# Patient Record
Sex: Male | Born: 1940
Health system: Southern US, Community
[De-identification: ages and names within clinical notes are randomized; demographics above are authoritative.]

## PROBLEM LIST (undated history)

## (undated) DIAGNOSIS — J449 Chronic obstructive pulmonary disease, unspecified: Secondary | ICD-10-CM

## (undated) DIAGNOSIS — R569 Unspecified convulsions: Secondary | ICD-10-CM

## (undated) DIAGNOSIS — K5730 Diverticulosis of large intestine without perforation or abscess without bleeding: Secondary | ICD-10-CM

## (undated) DIAGNOSIS — R911 Solitary pulmonary nodule: Secondary | ICD-10-CM

## (undated) DIAGNOSIS — Z860100 Personal history of colon polyps, unspecified: Secondary | ICD-10-CM

## (undated) DIAGNOSIS — I10 Essential (primary) hypertension: Secondary | ICD-10-CM

## (undated) DIAGNOSIS — E785 Hyperlipidemia, unspecified: Secondary | ICD-10-CM

## (undated) DIAGNOSIS — F1010 Alcohol abuse, uncomplicated: Secondary | ICD-10-CM

## (undated) DIAGNOSIS — R0602 Shortness of breath: Secondary | ICD-10-CM

## (undated) DIAGNOSIS — R972 Elevated prostate specific antigen [PSA]: Secondary | ICD-10-CM

## (undated) DIAGNOSIS — F17210 Nicotine dependence, cigarettes, uncomplicated: Secondary | ICD-10-CM

## (undated) DIAGNOSIS — K219 Gastro-esophageal reflux disease without esophagitis: Secondary | ICD-10-CM

## (undated) DIAGNOSIS — I639 Cerebral infarction, unspecified: Secondary | ICD-10-CM

## (undated) DIAGNOSIS — F5104 Psychophysiologic insomnia: Secondary | ICD-10-CM

## (undated) HISTORY — DX: Solitary pulmonary nodule: R91.1

## (undated) HISTORY — DX: Personal history of colon polyps, unspecified: Z86.0100

## (undated) HISTORY — DX: Gastro-esophageal reflux disease without esophagitis: K21.9

## (undated) HISTORY — DX: Elevated prostate specific antigen (PSA): R97.20

## (undated) HISTORY — PX: VASECTOMY: SHX75

## (undated) HISTORY — DX: Personal history of colonic polyps: Z86.010

## (undated) HISTORY — DX: Cerebral infarction, unspecified: I63.9

## (undated) HISTORY — DX: Psychophysiologic insomnia: F51.04

## (undated) HISTORY — DX: Diverticulosis of large intestine without perforation or abscess without bleeding: K57.30

## (undated) HISTORY — DX: Hyperlipidemia, unspecified: E78.5

## (undated) HISTORY — DX: Essential (primary) hypertension: I10

## (undated) HISTORY — PX: COLONOSCOPY: SHX174

## (undated) HISTORY — DX: Nicotine dependence, cigarettes, uncomplicated: F17.210

## (undated) HISTORY — DX: Chronic obstructive pulmonary disease, unspecified: J44.9

---
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
## 2001-02-02 ENCOUNTER — Encounter (INDEPENDENT_AMBULATORY_CARE_PROVIDER_SITE_OTHER): Payer: Self-pay | Admitting: Specialist

## 2001-02-02 ENCOUNTER — Other Ambulatory Visit: Admission: RE | Admit: 2001-02-02 | Discharge: 2001-02-02 | Payer: Self-pay | Admitting: Internal Medicine

## 2004-08-17 ENCOUNTER — Ambulatory Visit: Payer: Self-pay | Admitting: Pulmonary Disease

## 2004-08-18 ENCOUNTER — Ambulatory Visit: Payer: Self-pay | Admitting: Pulmonary Disease

## 2005-03-02 ENCOUNTER — Ambulatory Visit: Payer: Self-pay | Admitting: Pulmonary Disease

## 2005-03-24 ENCOUNTER — Ambulatory Visit: Payer: Self-pay | Admitting: Pulmonary Disease

## 2005-06-28 ENCOUNTER — Ambulatory Visit: Payer: Self-pay | Admitting: Pulmonary Disease

## 2005-07-21 ENCOUNTER — Ambulatory Visit: Payer: Self-pay | Admitting: Pulmonary Disease

## 2006-01-03 ENCOUNTER — Ambulatory Visit: Payer: Self-pay | Admitting: Pulmonary Disease

## 2006-01-04 ENCOUNTER — Ambulatory Visit: Payer: Self-pay | Admitting: Pulmonary Disease

## 2006-07-05 ENCOUNTER — Ambulatory Visit: Payer: Self-pay | Admitting: Internal Medicine

## 2006-07-18 ENCOUNTER — Encounter (INDEPENDENT_AMBULATORY_CARE_PROVIDER_SITE_OTHER): Payer: Self-pay | Admitting: *Deleted

## 2006-07-18 ENCOUNTER — Ambulatory Visit: Payer: Self-pay | Admitting: Internal Medicine

## 2006-07-26 ENCOUNTER — Ambulatory Visit: Payer: Self-pay | Admitting: Pulmonary Disease

## 2006-12-02 ENCOUNTER — Ambulatory Visit: Payer: Self-pay | Admitting: Pulmonary Disease

## 2007-03-29 ENCOUNTER — Ambulatory Visit: Payer: Self-pay | Admitting: Pulmonary Disease

## 2007-03-29 LAB — CONVERTED CEMR LAB
ALT: 36 units/L (ref 0–53)
AST: 47 units/L — ABNORMAL HIGH (ref 0–37)
Alkaline Phosphatase: 68 units/L (ref 39–117)
BUN: 4 mg/dL — ABNORMAL LOW (ref 6–23)
Basophils Relative: 0.3 % (ref 0.0–1.0)
CO2: 30 meq/L (ref 19–32)
Calcium: 9.6 mg/dL (ref 8.4–10.5)
Chloride: 106 meq/L (ref 96–112)
Direct LDL: 91.4 mg/dL
Eosinophils Absolute: 0 10*3/uL (ref 0.0–0.6)
GFR calc Af Amer: 96 mL/min
GFR calc non Af Amer: 80 mL/min
HDL: 72.4 mg/dL (ref >39.0)
Ketones, ur: NEGATIVE mg/dL
Lymphocytes Relative: 17.6 % (ref 12.0–46.0)
Monocytes Relative: 11.8 % — ABNORMAL HIGH (ref 3.0–11.0)
Neutro Abs: 5.6 10*3/uL (ref 1.4–7.7)
Nitrite: NEGATIVE
Platelets: 209 10*3/uL (ref 150–400)
RBC: 4.67 M/uL (ref 4.22–5.81)
Specific Gravity, Urine: 1.025 (ref 1.000–1.03)
TSH: 0.85 microintl units/mL (ref 0.35–5.50)
Total CHOL/HDL Ratio: 2.7
Triglycerides: 240 mg/dL (ref 0–149)
Urine Glucose: NEGATIVE mg/dL
VLDL: 48 mg/dL — ABNORMAL HIGH (ref 0–40)
WBC: 7.9 10*3/uL (ref 4.5–10.5)

## 2007-06-06 ENCOUNTER — Ambulatory Visit: Payer: Self-pay | Admitting: Pulmonary Disease

## 2007-08-18 ENCOUNTER — Telehealth (INDEPENDENT_AMBULATORY_CARE_PROVIDER_SITE_OTHER): Payer: Self-pay | Admitting: *Deleted

## 2007-10-10 ENCOUNTER — Ambulatory Visit: Payer: Self-pay | Admitting: Pulmonary Disease

## 2007-10-10 DIAGNOSIS — D126 Benign neoplasm of colon, unspecified: Secondary | ICD-10-CM

## 2007-10-10 DIAGNOSIS — E785 Hyperlipidemia, unspecified: Secondary | ICD-10-CM

## 2007-10-10 DIAGNOSIS — K219 Gastro-esophageal reflux disease without esophagitis: Secondary | ICD-10-CM | POA: Insufficient documentation

## 2007-10-10 DIAGNOSIS — E119 Type 2 diabetes mellitus without complications: Secondary | ICD-10-CM

## 2007-10-10 DIAGNOSIS — K5730 Diverticulosis of large intestine without perforation or abscess without bleeding: Secondary | ICD-10-CM | POA: Insufficient documentation

## 2007-10-10 DIAGNOSIS — I10 Essential (primary) hypertension: Secondary | ICD-10-CM

## 2007-10-15 DIAGNOSIS — G4700 Insomnia, unspecified: Secondary | ICD-10-CM | POA: Insufficient documentation

## 2007-10-15 LAB — CONVERTED CEMR LAB
AST: 32 units/L (ref 0–37)
Albumin: 4.3 g/dL (ref 3.5–5.2)
BUN: 9 mg/dL (ref 6–23)
Basophils Absolute: 0 10*3/uL (ref 0.0–0.1)
Calcium: 9.7 mg/dL (ref 8.4–10.5)
Chloride: 100 meq/L (ref 96–112)
Cholesterol: 162 mg/dL (ref 0–200)
Eosinophils Absolute: 0 10*3/uL (ref 0.0–0.6)
GFR calc non Af Amer: 79 mL/min
HDL: 54.2 mg/dL (ref >39.0)
Hgb A1c MFr Bld: 5.1 % (ref 4.6–6.0)
MCHC: 35 g/dL (ref 30.0–36.0)
MCV: 105 fL — ABNORMAL HIGH (ref 78.0–100.0)
Monocytes Relative: 6.7 % (ref 3.0–11.0)
Neutrophils Relative %: 81.4 % — ABNORMAL HIGH (ref 43.0–77.0)
Platelets: 261 10*3/uL (ref 150–400)
RBC: 4.75 M/uL (ref 4.22–5.81)
Sodium: 137 meq/L (ref 135–145)
TSH: 0.42 microintl units/mL (ref 0.35–5.50)
Triglycerides: 204 mg/dL (ref 0–149)

## 2007-10-20 ENCOUNTER — Telehealth (INDEPENDENT_AMBULATORY_CARE_PROVIDER_SITE_OTHER): Payer: Self-pay | Admitting: *Deleted

## 2007-11-10 ENCOUNTER — Ambulatory Visit: Payer: Self-pay | Admitting: Pulmonary Disease

## 2007-11-14 ENCOUNTER — Telehealth: Payer: Self-pay | Admitting: Pulmonary Disease

## 2007-11-22 ENCOUNTER — Telehealth (INDEPENDENT_AMBULATORY_CARE_PROVIDER_SITE_OTHER): Payer: Self-pay | Admitting: *Deleted

## 2008-02-06 ENCOUNTER — Ambulatory Visit: Payer: Self-pay | Admitting: Pulmonary Disease

## 2008-02-07 DIAGNOSIS — F17200 Nicotine dependence, unspecified, uncomplicated: Secondary | ICD-10-CM

## 2008-02-07 DIAGNOSIS — Z720 Tobacco use: Secondary | ICD-10-CM | POA: Insufficient documentation

## 2008-02-21 ENCOUNTER — Encounter: Payer: Self-pay | Admitting: Pulmonary Disease

## 2008-03-18 ENCOUNTER — Telehealth (INDEPENDENT_AMBULATORY_CARE_PROVIDER_SITE_OTHER): Payer: Self-pay | Admitting: *Deleted

## 2008-10-22 ENCOUNTER — Ambulatory Visit: Payer: Self-pay | Admitting: Pulmonary Disease

## 2008-10-23 ENCOUNTER — Ambulatory Visit: Payer: Self-pay | Admitting: Pulmonary Disease

## 2008-10-23 ENCOUNTER — Telehealth: Payer: Self-pay | Admitting: Pulmonary Disease

## 2008-10-28 LAB — CONVERTED CEMR LAB
AST: 23 units/L (ref 0–37)
Albumin: 3.6 g/dL (ref 3.5–5.2)
Alkaline Phosphatase: 89 units/L (ref 39–117)
BUN: 10 mg/dL (ref 6–23)
Eosinophils Relative: 0.8 % (ref 0.0–5.0)
GFR calc Af Amer: 108 mL/min
Glucose, Bld: 120 mg/dL — ABNORMAL HIGH (ref 70–99)
HCT: 43.8 % (ref 39.0–52.0)
HDL: 60.6 mg/dL (ref >39.0)
Hemoglobin: 15.4 g/dL (ref 13.0–17.0)
Monocytes Absolute: 0.8 10*3/uL (ref 0.1–1.0)
Monocytes Relative: 10.5 % (ref 3.0–12.0)
Platelets: 258 10*3/uL (ref 150–400)
Potassium: 4.1 meq/L (ref 3.5–5.1)
RBC: 4.24 M/uL (ref 4.22–5.81)
TSH: 0.92 microintl units/mL (ref 0.35–5.50)
Total CHOL/HDL Ratio: 2.5
Total Protein: 6.5 g/dL (ref 6.0–8.3)
WBC: 7.7 10*3/uL (ref 4.5–10.5)

## 2008-10-29 ENCOUNTER — Ambulatory Visit: Payer: Self-pay | Admitting: Cardiology

## 2008-11-01 ENCOUNTER — Telehealth: Payer: Self-pay | Admitting: Pulmonary Disease

## 2009-01-08 ENCOUNTER — Telehealth: Payer: Self-pay | Admitting: Pulmonary Disease

## 2009-01-25 HISTORY — PX: OTHER SURGICAL HISTORY: SHX169

## 2009-01-28 ENCOUNTER — Ambulatory Visit: Payer: Self-pay | Admitting: Pulmonary Disease

## 2009-01-28 DIAGNOSIS — J984 Other disorders of lung: Secondary | ICD-10-CM | POA: Insufficient documentation

## 2009-01-28 DIAGNOSIS — J449 Chronic obstructive pulmonary disease, unspecified: Secondary | ICD-10-CM

## 2009-02-04 ENCOUNTER — Ambulatory Visit (HOSPITAL_COMMUNITY): Admission: RE | Admit: 2009-02-04 | Discharge: 2009-02-04 | Payer: Self-pay | Admitting: Pulmonary Disease

## 2009-02-11 ENCOUNTER — Encounter: Payer: Self-pay | Admitting: Pulmonary Disease

## 2009-02-11 ENCOUNTER — Encounter (INDEPENDENT_AMBULATORY_CARE_PROVIDER_SITE_OTHER): Payer: Self-pay | Admitting: Interventional Radiology

## 2009-02-11 ENCOUNTER — Ambulatory Visit (HOSPITAL_COMMUNITY): Admission: RE | Admit: 2009-02-11 | Discharge: 2009-02-11 | Payer: Self-pay | Admitting: Pulmonary Disease

## 2009-02-14 ENCOUNTER — Telehealth (INDEPENDENT_AMBULATORY_CARE_PROVIDER_SITE_OTHER): Payer: Self-pay | Admitting: *Deleted

## 2009-05-23 ENCOUNTER — Encounter: Payer: Self-pay | Admitting: Pulmonary Disease

## 2009-05-26 ENCOUNTER — Telehealth: Payer: Self-pay | Admitting: Pulmonary Disease

## 2009-06-03 ENCOUNTER — Ambulatory Visit: Payer: Self-pay | Admitting: Pulmonary Disease

## 2009-06-03 ENCOUNTER — Telehealth: Payer: Self-pay | Admitting: Pulmonary Disease

## 2009-06-03 LAB — CONVERTED CEMR LAB
AST: 24 units/L (ref 0–37)
Alkaline Phosphatase: 81 units/L (ref 39–117)
Basophils Absolute: 0.1 10*3/uL (ref 0.0–0.1)
Bilirubin, Direct: 0.2 mg/dL (ref 0.0–0.3)
GFR calc non Af Amer: 89.17 mL/min (ref >60)
HCT: 44.8 % (ref 39.0–52.0)
LDL Cholesterol: 84 mg/dL (ref 0–99)
Lymphs Abs: 1.4 10*3/uL (ref 0.7–4.0)
MCV: 108.6 fL — ABNORMAL HIGH (ref 78.0–100.0)
Monocytes Absolute: 0.7 10*3/uL (ref 0.1–1.0)
Neutro Abs: 4.8 10*3/uL (ref 1.4–7.7)
Platelets: 263 10*3/uL (ref 150.0–400.0)
Potassium: 4.2 meq/L (ref 3.5–5.1)
RDW: 13 % (ref 11.5–14.6)
Sodium: 139 meq/L (ref 135–145)
TSH: 0.6 microintl units/mL (ref 0.35–5.50)
Total Bilirubin: 1.3 mg/dL — ABNORMAL HIGH (ref 0.3–1.2)
Total CHOL/HDL Ratio: 3
VLDL: 14 mg/dL (ref 0.0–40.0)

## 2009-08-29 ENCOUNTER — Encounter: Payer: Self-pay | Admitting: Pulmonary Disease

## 2009-09-02 ENCOUNTER — Observation Stay (HOSPITAL_COMMUNITY): Admission: RE | Admit: 2009-09-02 | Discharge: 2009-09-03 | Payer: Self-pay | Admitting: Ophthalmology

## 2009-09-02 ENCOUNTER — Telehealth (INDEPENDENT_AMBULATORY_CARE_PROVIDER_SITE_OTHER): Payer: Self-pay | Admitting: *Deleted

## 2009-10-02 ENCOUNTER — Telehealth: Payer: Self-pay | Admitting: Pulmonary Disease

## 2009-10-03 ENCOUNTER — Ambulatory Visit: Payer: Self-pay | Admitting: Pulmonary Disease

## 2009-10-05 LAB — CONVERTED CEMR LAB
ALT: 29 units/L (ref 0–53)
Basophils Relative: 0.7 % (ref 0.0–3.0)
Bilirubin, Direct: 0.3 mg/dL (ref 0.0–0.3)
Chloride: 109 meq/L (ref 96–112)
Creatinine, Ser: 0.9 mg/dL (ref 0.4–1.5)
Eosinophils Absolute: 0 10*3/uL (ref 0.0–0.7)
Eosinophils Relative: 0.4 % (ref 0.0–5.0)
GFR calc non Af Amer: 89.08 mL/min (ref >60)
HCT: 48.4 % (ref 39.0–52.0)
Hemoglobin: 16 g/dL (ref 13.0–17.0)
LDL Cholesterol: 90 mg/dL (ref 0–99)
Lymphs Abs: 1.5 10*3/uL (ref 0.7–4.0)
MCHC: 33 g/dL (ref 30.0–36.0)
MCV: 108.6 fL — ABNORMAL HIGH (ref 78.0–100.0)
Monocytes Absolute: 0.8 10*3/uL (ref 0.1–1.0)
Neutro Abs: 5 10*3/uL (ref 1.4–7.7)
Neutrophils Relative %: 69 % (ref 43.0–77.0)
RBC: 4.45 M/uL (ref 4.22–5.81)
Total Bilirubin: 1.2 mg/dL (ref 0.3–1.2)
Total CHOL/HDL Ratio: 3
Triglycerides: 60 mg/dL (ref 0.0–149.0)
VLDL: 12 mg/dL (ref 0.0–40.0)
WBC: 7.4 10*3/uL (ref 4.5–10.5)

## 2009-12-10 ENCOUNTER — Telehealth: Payer: Self-pay | Admitting: Pulmonary Disease

## 2010-01-27 ENCOUNTER — Telehealth (INDEPENDENT_AMBULATORY_CARE_PROVIDER_SITE_OTHER): Payer: Self-pay | Admitting: *Deleted

## 2010-02-27 ENCOUNTER — Telehealth: Payer: Self-pay | Admitting: Pulmonary Disease

## 2010-03-31 ENCOUNTER — Telehealth (INDEPENDENT_AMBULATORY_CARE_PROVIDER_SITE_OTHER): Payer: Self-pay | Admitting: *Deleted

## 2010-04-01 ENCOUNTER — Ambulatory Visit: Payer: Self-pay | Admitting: Pulmonary Disease

## 2010-04-16 ENCOUNTER — Telehealth (INDEPENDENT_AMBULATORY_CARE_PROVIDER_SITE_OTHER): Payer: Self-pay | Admitting: *Deleted

## 2010-09-10 ENCOUNTER — Telehealth: Payer: Self-pay | Admitting: Pulmonary Disease

## 2010-09-11 ENCOUNTER — Telehealth (INDEPENDENT_AMBULATORY_CARE_PROVIDER_SITE_OTHER): Payer: Self-pay | Admitting: *Deleted

## 2010-09-30 ENCOUNTER — Ambulatory Visit
Admission: RE | Admit: 2010-09-30 | Discharge: 2010-09-30 | Payer: Self-pay | Source: Home / Self Care | Attending: Pulmonary Disease | Admitting: Pulmonary Disease

## 2010-09-30 ENCOUNTER — Other Ambulatory Visit: Payer: Self-pay | Admitting: Pulmonary Disease

## 2010-09-30 DIAGNOSIS — R972 Elevated prostate specific antigen [PSA]: Secondary | ICD-10-CM | POA: Insufficient documentation

## 2010-09-30 LAB — BASIC METABOLIC PANEL
BUN: 4 mg/dL — ABNORMAL LOW (ref 6–23)
CO2: 26 mEq/L (ref 19–32)
Calcium: 9.4 mg/dL (ref 8.4–10.5)
Chloride: 106 mEq/L (ref 96–112)
Creatinine, Ser: 0.6 mg/dL (ref 0.4–1.5)
GFR: 134.06 mL/min (ref >60.00)
Glucose, Bld: 82 mg/dL (ref 70–99)
Potassium: 5 mEq/L (ref 3.5–5.1)
Sodium: 141 mEq/L (ref 135–145)

## 2010-09-30 LAB — CBC WITH DIFFERENTIAL/PLATELET
Basophils Absolute: 0 10*3/uL (ref 0.0–0.1)
Basophils Relative: 0.6 % (ref 0.0–3.0)
Eosinophils Absolute: 0 10*3/uL (ref 0.0–0.7)
Eosinophils Relative: 0.7 % (ref 0.0–5.0)
HCT: 47.7 % (ref 39.0–52.0)
Hemoglobin: 16.5 g/dL (ref 13.0–17.0)
Lymphocytes Relative: 21 % (ref 12.0–46.0)
Lymphs Abs: 1.2 10*3/uL (ref 0.7–4.0)
MCHC: 34.7 g/dL (ref 30.0–36.0)
MCV: 108.5 fl — ABNORMAL HIGH (ref 78.0–100.0)
Monocytes Absolute: 0.5 10*3/uL (ref 0.1–1.0)
Monocytes Relative: 7.9 % (ref 3.0–12.0)
Neutro Abs: 4.1 10*3/uL (ref 1.4–7.7)
Neutrophils Relative %: 69.8 % (ref 43.0–77.0)
Platelets: 156 10*3/uL (ref 150.0–400.0)
RBC: 4.4 Mil/uL (ref 4.22–5.81)
RDW: 14.2 % (ref 11.5–14.6)
WBC: 5.8 10*3/uL (ref 4.5–10.5)

## 2010-09-30 LAB — HEPATIC FUNCTION PANEL
ALT: 30 U/L (ref 0–53)
AST: 48 U/L — ABNORMAL HIGH (ref 0–37)
Albumin: 3.6 g/dL (ref 3.5–5.2)
Alkaline Phosphatase: 76 U/L (ref 39–117)
Bilirubin, Direct: 0.2 mg/dL (ref 0.0–0.3)
Total Bilirubin: 0.8 mg/dL (ref 0.3–1.2)
Total Protein: 6.3 g/dL (ref 6.0–8.3)

## 2010-09-30 LAB — LIPID PANEL
Cholesterol: 208 mg/dL — ABNORMAL HIGH (ref 0–200)
HDL: 106 mg/dL (ref >39.00)
Total CHOL/HDL Ratio: 2
Triglycerides: 70 mg/dL (ref 0.0–149.0)
VLDL: 14 mg/dL (ref 0.0–40.0)

## 2010-09-30 LAB — PSA: PSA: 7.39 ng/mL — ABNORMAL HIGH (ref 0.10–4.00)

## 2010-09-30 LAB — TSH: TSH: 1.05 u[IU]/mL (ref 0.35–5.50)

## 2010-09-30 LAB — LDL CHOLESTEROL, DIRECT: Direct LDL: 79.9 mg/dL

## 2010-10-19 ENCOUNTER — Telehealth (INDEPENDENT_AMBULATORY_CARE_PROVIDER_SITE_OTHER): Payer: Self-pay | Admitting: *Deleted

## 2010-10-20 ENCOUNTER — Ambulatory Visit
Admission: RE | Admit: 2010-10-20 | Discharge: 2010-10-20 | Payer: Self-pay | Source: Home / Self Care | Attending: Pulmonary Disease | Admitting: Pulmonary Disease

## 2010-10-27 NOTE — Progress Notes (Signed)
Summary: fax request  Phone Note From Other Clinic   Caller: kate at [facility] Call For: nadel Summary of Call: requests last OV notes/ EKG/ STRESS TEST. fax to attn kate: [phone]. contact # is [phone] Initial call taken by: Tivis Ringer, CNA,  April 16, 2010 8:58 AM  Follow-up for Phone Call        Faxed records.//Juanita Follow-up by: Darletta Moll,  April 16, 2010 9:21 AM
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
## 2010-10-27 NOTE — Progress Notes (Signed)
Summary: PRESCRIPT  Phone Note Call from Patient   Caller: Patient Call For: NADEL Summary of Call: PT NEED TEMAZEPAM REFILL [facility] [address] Initial call taken by: Rickard Patience,  December 10, 2009 10:55 AM  Follow-up for Phone Call        pt advised refill request too soon. Advised cant have refill until 12/15/09 becasue last refill was given on 11/17/09. Pt staets understanding. Carron Curie CMA  December 10, 2009 12:06 PM
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
## 2010-10-27 NOTE — Progress Notes (Signed)
Summary: labs?  Phone Note Call from Patient Call back at [facility] Phone [phone]   Caller: Patient Call For: Darril Patriarca Reason for Call: Talk to Nurse Summary of Call: is SN going to need fasting bloodwork for pt's appt tomorrow? Initial call taken by: Eugene Gavia,  October 02, 2009 8:19 AM  Follow-up for Phone Call        Last ov note states cxr at follow-up, but nothing about labs. Please advise. Carron Curie CMA  October 02, 2009 8:35 AM    called and spoke with pt and he is aware of labs and cxr in the am prior to his appt.   Randell Loop CMA  October 02, 2009 10:36 AM

## 2010-10-27 NOTE — Progress Notes (Signed)
Summary: rx  Phone Note Call from Patient Call back at Home Phone [phone]   Caller: Patient Call For: Blake [name] Reason for Call: Refill Medication, Talk to Nurse Summary of Call: Pt needs refill on his Temazopam  [facility] [address] Initial call taken by: Eugene Gavia,  February 27, 2010 9:10 AM  Follow-up for Phone Call        refill sent. pt aware.  Carron Curie CMA  February 27, 2010 9:47 AM     Prescriptions: TEMAZEPAM 30 MG  CAPS (TEMAZEPAM) 1 by mouth at bedtime as needed sleep  #30 x 0   Entered by:   Carron Curie CMA   Authorized by:   Michele Mcalpine MD   Signed by:   Carron Curie CMA on 02/27/2010   Method used:   Telephoned to ...       [facility]  [address] [id number]       [address]       [place], [place]  [address]       Ph: [phone] or [phone]       Fax: [phone]   RxID:   [id number]

## 2010-10-27 NOTE — Progress Notes (Signed)
Summary: rx refill (pt waiting)  Phone Note Call from Patient   Caller: Patient Call For: nadel Summary of Call: pt is upset that his rx for temazepam has not been filled yet. [facility] [address] [facility] they have contacted dr Jodelle Green office twice w/ no response. i advised pt to call them and let them know that no request for refill has been faxed to [place] and that i would send this msg to nurse in the meantime. pt # [id number] NOTE: pt says he has eye surgery pending soon and needs to get this asap. Initial call taken by: Tivis Ringer, CNA,  Jan 27, 2010 12:27 PM  Follow-up for Phone Call        called and spoke with pt. pt aware rx sent to pharmacy.  Aundra Millet Reynolds LPN  Jan 27, 6577 1:06 PM     Prescriptions: TEMAZEPAM 30 MG  CAPS (TEMAZEPAM) 1 by mouth at bedtime as needed sleep  #30 x 0   Entered by:   Arman Filter LPN   Authorized by:   Michele Mcalpine MD   Signed by:   Arman Filter LPN on 46/96/2952   Method used:   Telephoned to ...       [facility]  [address] [id number]       [address]       [place], [place]  [address]       Ph: [phone] or [phone]       Fax: [phone]   RxID:   [id number]

## 2010-10-27 NOTE — Progress Notes (Signed)
Summary: talk to nurse  Phone Note Call from Patient Call back at Home Phone [phone]   Caller: Patient Call For: nadel Summary of Call: Has appt with SN tomorrow, wants to know if he has to have any labs done also. Initial call taken by: Darletta Moll,  March 31, 2010 1:30 PM  Follow-up for Phone Call        called spoke with patient.  will let SN decide about labs and pt may return to have them drawn if needed per leigh.  pt verbalized his understanding. Boone Master CNA/MA  March 31, 2010 2:50 PM

## 2010-10-29 NOTE — Progress Notes (Signed)
Summary: appointment tomorrow for forms wants to have f/u for prostate 2  Phone Note Call from Patient   Caller: Patient Call For: dr nadel Summary of Call: Patient phoned stated that he has an appointment to have forms completed tomorrow, but he was here earlier this month for his prostate and was to follow up when he finished his meds to recheck this. He wants to know if this is just lab work and can he do that tomorrow while he is here. Patient can be reached at [phone] Initial call taken by: Vedia Coffer,  October 19, 2010 11:14 AM  Follow-up for Phone Call        pt was started on septra ds 1.6.12 for elevated psa and told to return on 2.8.12 for repeat psa.  pt has appt 1.24.12 w/ SN.    called spoke with patient to inform him of this, pt states he still has 6 more tabs left to take.  informed pt that 2 weeks ealry, especially if he hasn't finished the meds is too early.  pt okay with this and verbalized his understanding.  will keep his appt tomorrow w/ SN and states he has more paperwork from the [facility] that he will bring at this time. Boone Master CNA/MA  October 19, 2010 2:40 PM

## 2010-10-29 NOTE — Assessment & Plan Note (Signed)
Summary: dmv forms/la   CC:  ROV to complete DMV forms....  History of Present Illness: [name] here for a follow up visit... he has mult med problems as noted below... Followed for general medical purposes w/ hx of COPD, still smoking 1ppd, HBP, Hyperlipidemia, DM, & chr persistant insomnia...  He had called several times asking [place] to fax his records to the [place]-DMV which we did at his request, but of course they wanted their [facility] Medical forms completed & we asked him to return w/ these papaers for completion >>> -DMV forms completed and scanned into the EMR... I indicated to him that he will need to have the Vision page completed by his Ophthalmologist esp in light of his right eye trauma & blindness...  Current Meds:  ATENOLOL 50 MG TABS (ATENOLOL) take 1 tab by mouth once daily... RAMIPRIL 5 MG CAPS (RAMIPRIL) take 1 cap by mouth once daily... GEMFIBROZIL 600 MG TABS (GEMFIBROZIL) take one tablet by mouth two times a day... CIALIS 20 MG  TABS (TADALAFIL) use as directed TEMAZEPAM 30 MG  CAPS (TEMAZEPAM) 1 by mouth at bedtime as needed sleep FOLIC ACID 1 MG TABS (FOLIC ACID) Take 1 tablet by mouth once a day    Preventive Screening-Counseling & Management  Alcohol-Tobacco     Smoking Status: current     Packs/Day: 1   Allergies (verified): No Known Drug Allergies  Comments:  Nurse/Medical Assistant: The patient's medications and allergies were reviewed with the patient and were updated in the Medication and Allergy Lists.  Past History:  Past Medical History: PULMONARY NODULE (ICD-518.89) COPD (ICD-496) CIGARETTE SMOKER (ICD-305.1) HYPERTENSION (ICD-401.9) HYPERLIPIDEMIA (ICD-272.4) DM (ICD-250.00) GERD (ICD-530.81) DIVERTICULOSIS OF COLON (ICD-562.10) COLONIC POLYPS (ICD-211.3) ELEVATED PROSTATE SPECIFIC ANTIGEN (ICD-790.93) INSOMNIA, CHRONIC (ICD-307.42)  Past Surgical History: S/P vasectomy S/P needle bx RUL nodule 5/10= benign  Family History: Reviewed  history from 04/01/2010 and no changes required. Father died age 55 w/ DM... Mother died age 67 w/ the Flu, hx THR... 1 Sibling- Brother died age 87 from lung cancer  Social History: Reviewed history from 04/01/2010 and no changes required. Widow, wife=  Mary died in the 2000s 1 Child- son died in MVA 1ppd smoker x yrs hx signif alcohol- now 6pk beer daily retired from [employer], IT trainer  Review of Systems       The patient complains of vision loss and dyspnea on exertion.  The patient denies anorexia, fever, weight loss, weight gain, decreased hearing, hoarseness, chest pain, syncope, peripheral edema, prolonged cough, headaches, hemoptysis, abdominal pain, melena, hematochezia, severe indigestion/heartburn, hematuria, incontinence, muscle weakness, suspicious skin lesions, transient blindness, difficulty walking, depression, unusual weight change, abnormal bleeding, enlarged lymph nodes, and angioedema.    Vital Signs:  Patient profile:   70 year old male Height:      70.5 inches Weight:      168.56 pounds O2 Sat:      97 % on Room air Temp:     98.6 degrees F oral Pulse rate:   68 / minute BP sitting:   126 / 70  (left arm) Cuff size:   regular  Vitals Entered By: Randell Loop CMA (October 20, 2010 3:09 PM)  O2 Sat at Rest %:  97 O2 Flow:  Room air CC: ROV to complete DMV forms... Is Patient Diabetic? No Pain Assessment Patient in pain? no      Comments no changes in meds today   Physical Exam  Additional Exam:  WD, WN, [name]  in NAD...  GENERAL:  Alert & oriented; pleasant & cooperative... HEENT:  EOM-wnl, Right eye sl dilated- blood in vitreous improved, EACs-clear, TMs-wnl, NOSE-clear, THROAT-clear & wnl. NECK:  Supple w/ fairROM; no JVD; normal carotid impulses w/o bruits; no thyromegaly or nodules palpated; no lymphadenopathy. CHEST:  Clear to P & A x scat rhonchi, no wheezing, no rales, no signs of consolidation... HEART:  Regular Rhythm; without murmurs/  rubs/ or gallops heard... ABDOMEN:  Obese, soft & nontender; normal bowel sounds; no organomegaly or masses detected. EXT: without deformities, mild arthritic changes; no varicose veins/ venous insuffic/ or edema. NEURO:  CN's intact;  no focal neuro deficits x decr vision right eye... DERM:  No lesions noted; no rash etc...    Impression & Recommendations:  Problem # 1:  [place]-DMV form completed today>>> We scanned these forms into computer, and mailed orig to [place]...  Problem # 2:  COPD (ICD-496) Stable>  needs to quit all smoking (still smokes 1ppd & not motivated to quit)...  Problem # 3:  HYPERTENSION (ICD-401.9) Controlled on meds>  continue same. His updated medication list for this problem includes:    Atenolol 50 Mg Tabs (Atenolol) .Marland Kitchen... Take 1 tab by mouth once daily...    Ramipril 5 Mg Caps (Ramipril) .Marland Kitchen... Take 1 cap by mouth once daily...  Problem # 4:  HYPERLIPIDEMIA (ICD-272.4) Stable on Fibrate + diet... His updated medication list for this problem includes:    Gemfibrozil 600 Mg Tabs (Gemfibrozil) .Marland Kitchen... Take one tablet by mouth two times a day...  Problem # 5:  DM (ICD-250.00) On diet alone & doing satis... His updated medication list for this problem includes:    Ramipril 5 Mg Caps (Ramipril) .Marland Kitchen... Take 1 cap by mouth once daily...  Problem # 6:  INSOMNIA, CHRONIC (ICD-307.42) He uses Temazepam as needed...  Problem # 7:  OTHER MEDICAL PROBLEMS AS NOTED>>>  Complete Medication List: 1)  Atenolol 50 Mg Tabs (Atenolol) .... Take 1 tab by mouth once daily.Marland KitchenMarland Kitchen 2)  Ramipril 5 Mg Caps (Ramipril) .... Take 1 cap by mouth once daily.Marland KitchenMarland Kitchen 3)  Gemfibrozil 600 Mg Tabs (Gemfibrozil) .... Take one tablet by mouth two times a day... 4)  Cialis 20 Mg Tabs (Tadalafil) .... Use as directed 5)  Temazepam 30 Mg Caps (Temazepam) .Marland Kitchen.. 1 by mouth at bedtime as needed sleep 6)  Folic Acid 1 Mg Tabs (Folic acid) .... Take 1 tablet by mouth once a day 7)  Septra Ds 800-160 Mg Tabs  (Sulfamethoxazole-trimethoprim) .... Take 1 tab by mouth two times a day til gone...  Patient Instructions: 1)  We will mail in your DMV form... 2)  Be sure to have your eye doctor complete the vision part of the form... 3)  Call for any questions.Marland KitchenMarland Kitchen 4)  Keep your prev sched follow up ROV.Marland KitchenMarland Kitchen

## 2010-10-29 NOTE — Progress Notes (Signed)
Summary: labs/ cxr  Phone Note Call from Patient Call back at Home Phone [phone]   Caller: Patient Call For: nadel Summary of Call: pt want sto know if he needs to come in early for labs and cxr. pt has appt w/ dr Kriste Basque 09/30/10. 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  Initial call taken by: Tivis Ringer, CNA,  September 10, 2010 11:06 AM  Follow-up for Phone Call        Pt is requestign labs be rder for his CPX on 09-30-10. 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 Please advsie. Carron Curie CMA  September 10, 2010 11:43 AM   Additional Follow-up for Phone Call Additional follow up Details #1::        per SN---ok for pt to come in for fasting labs and cxr prior to ov---these are in the computer for pt---pt insisted that these be done on the same day as appt-  pt will come in at 1 for these and his appt is at 2pm.   Randell Loop CMA  September 10, 2010 2:01 PM

## 2010-10-29 NOTE — Progress Notes (Signed)
Summary: re-fax request- needs asap!  Phone Note Call from Patient Call back at [facility] Phone [phone]   Caller: Patient Call For: nadel Summary of Call: pt requests to have OV notes from 04/01/10 faxed to [facility] asap. this was previously done but pt says DMV told him they have never received this. fax to [phone]. 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 call pt to confirm this has been done.  Initial call taken by: Tivis Ringer, CNA,  September 11, 2010 12:12 PM  Follow-up for Phone Call        Patient called medical records needing his 04/01/2010 office note faxed to the [facility]. 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 I advised the patient that he would need to sign a Medical release and then that note would be sent. He asked me to mail it to his home address. Release went out today.  Follow-up by: Wilder Glade,  September 11, 2010 12:30 PM
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
## 2010-11-04 ENCOUNTER — Other Ambulatory Visit: Payer: Self-pay

## 2010-11-13 ENCOUNTER — Other Ambulatory Visit: Payer: Medicare Other

## 2010-11-13 ENCOUNTER — Other Ambulatory Visit: Payer: Self-pay | Admitting: Pulmonary Disease

## 2010-11-13 ENCOUNTER — Encounter (INDEPENDENT_AMBULATORY_CARE_PROVIDER_SITE_OTHER): Payer: Self-pay | Admitting: *Deleted

## 2010-11-13 DIAGNOSIS — Z125 Encounter for screening for malignant neoplasm of prostate: Secondary | ICD-10-CM

## 2010-12-29 LAB — BASIC METABOLIC PANEL
BUN: 10 mg/dL (ref 6–23)
Chloride: 108 mEq/L (ref 96–112)
Glucose, Bld: 90 mg/dL (ref 70–99)
Potassium: 4.6 mEq/L (ref 3.5–5.1)

## 2010-12-29 LAB — CBC
HCT: 47.1 % (ref 39.0–52.0)
MCV: 107.2 fL — ABNORMAL HIGH (ref 78.0–100.0)
Platelets: 155 10*3/uL (ref 150–400)
WBC: 6.2 10*3/uL (ref 4.0–10.5)

## 2011-01-05 LAB — CBC
HCT: 45.9 % (ref 39.0–52.0)
MCHC: 35.5 g/dL (ref 30.0–36.0)
Platelets: 186 10*3/uL (ref 150–400)
RDW: 13.8 % (ref 11.5–15.5)

## 2011-01-05 LAB — APTT: aPTT: 29 seconds (ref 24–37)

## 2011-01-27 ENCOUNTER — Telehealth: Payer: Self-pay | Admitting: Pulmonary Disease

## 2011-01-27 NOTE — Telephone Encounter (Signed)
[id number] x1 

## 2011-01-27 NOTE — Telephone Encounter (Signed)
Pt phoned stated he was returning a call he can be reached at [phone].Vedia Coffer

## 2011-01-27 NOTE — Telephone Encounter (Signed)
Spoke w/ pt and he is coming in 5/8 at 2:30 to fill out paper work he dropped off. Blake [name] has these forms

## 2011-02-02 ENCOUNTER — Encounter: Payer: Self-pay | Admitting: Pulmonary Disease

## 2011-02-02 ENCOUNTER — Other Ambulatory Visit (INDEPENDENT_AMBULATORY_CARE_PROVIDER_SITE_OTHER): Payer: Medicare Other | Admitting: Pulmonary Disease

## 2011-02-02 ENCOUNTER — Other Ambulatory Visit: Payer: Medicare Other

## 2011-02-02 ENCOUNTER — Ambulatory Visit (INDEPENDENT_AMBULATORY_CARE_PROVIDER_SITE_OTHER): Payer: Medicare Other | Admitting: Pulmonary Disease

## 2011-02-02 DIAGNOSIS — Z024 Encounter for examination for driving license: Secondary | ICD-10-CM

## 2011-02-02 DIAGNOSIS — I10 Essential (primary) hypertension: Secondary | ICD-10-CM

## 2011-02-02 DIAGNOSIS — Z0289 Encounter for other administrative examinations: Secondary | ICD-10-CM

## 2011-02-02 DIAGNOSIS — K219 Gastro-esophageal reflux disease without esophagitis: Secondary | ICD-10-CM

## 2011-02-02 DIAGNOSIS — J449 Chronic obstructive pulmonary disease, unspecified: Secondary | ICD-10-CM

## 2011-02-02 DIAGNOSIS — N390 Urinary tract infection, site not specified: Secondary | ICD-10-CM

## 2011-02-02 DIAGNOSIS — G4700 Insomnia, unspecified: Secondary | ICD-10-CM

## 2011-02-02 DIAGNOSIS — E785 Hyperlipidemia, unspecified: Secondary | ICD-10-CM

## 2011-02-02 LAB — URINALYSIS, ROUTINE W REFLEX MICROSCOPIC
Ketones, ur: NEGATIVE
Specific Gravity, Urine: 1.015 (ref 1.000–1.030)
Total Protein, Urine: NEGATIVE
Urine Glucose: NEGATIVE
Urobilinogen, UA: 0.2 (ref 0.0–1.0)
pH: 7 (ref 5.0–8.0)
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
## 2011-02-02 NOTE — Patient Instructions (Signed)
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    We will mail it to [facility]...  Call for any problems, or if we can be of service in any way.Marland KitchenMarland Kitchen

## 2011-02-10 ENCOUNTER — Other Ambulatory Visit: Payer: Self-pay | Admitting: *Deleted

## 2011-02-10 NOTE — Telephone Encounter (Signed)
Refill for temazepam has been faxed back to [facility].  With no refills per SN

## 2011-02-10 NOTE — Telephone Encounter (Signed)
Pls advise if okay to send refills for Temazepam, 90 day supply, to [facility]. Form for signature given to Leigh.

## 2011-02-22 ENCOUNTER — Encounter: Payer: Self-pay | Admitting: Pulmonary Disease

## 2011-03-03 ENCOUNTER — Telehealth: Payer: Self-pay | Admitting: Pulmonary Disease

## 2011-03-03 DIAGNOSIS — E119 Type 2 diabetes mellitus without complications: Secondary | ICD-10-CM

## 2011-03-03 NOTE — Telephone Encounter (Signed)
Patient called back stated he was waiting on a call back for appointment his telephone is messing up he can be reached at [phone].Blake [name]

## 2011-03-03 NOTE — Telephone Encounter (Signed)
Spoke with pt.  He states that DMV is requiring that he have HBA1C drawn in order to renew his license. Pls advise if okay to order this thanks

## 2011-03-03 NOTE — Telephone Encounter (Signed)
Called and spoke with pt and he is aware of labs in the computer for tomorrow and pt is aware that he can come in anytime for these

## 2011-03-04 ENCOUNTER — Other Ambulatory Visit (INDEPENDENT_AMBULATORY_CARE_PROVIDER_SITE_OTHER): Payer: Medicare Other

## 2011-03-04 DIAGNOSIS — E119 Type 2 diabetes mellitus without complications: Secondary | ICD-10-CM

## 2011-03-04 LAB — BASIC METABOLIC PANEL WITH GFR
BUN: 5 mg/dL — ABNORMAL LOW (ref 6–23)
CO2: 26 meq/L (ref 19–32)
Calcium: 8.9 mg/dL (ref 8.4–10.5)
Chloride: 99 meq/L (ref 96–112)
Creatinine, Ser: 0.6 mg/dL (ref 0.4–1.5)
GFR: 138.97 mL/min
Glucose, Bld: 78 mg/dL (ref 70–99)
Potassium: 3.7 meq/L (ref 3.5–5.1)
Sodium: 137 meq/L (ref 135–145)

## 2011-03-04 LAB — HEMOGLOBIN A1C: Hgb A1c MFr Bld: 4.7 % (ref 4.6–6.5)

## 2011-03-08 ENCOUNTER — Telehealth: Payer: Self-pay | Admitting: Pulmonary Disease

## 2011-03-08 NOTE — Telephone Encounter (Signed)
Spoke with pt and advised per result note, copy already was sent to the [facility]. I have also mailed him the results per his request.

## 2011-03-09 ENCOUNTER — Telehealth: Payer: Self-pay | Admitting: Pulmonary Disease

## 2011-03-09 NOTE — Telephone Encounter (Signed)
Error pt received letter today from [facility] he also received letter from our office with all of his results. He thinks maybe everything just crossed in the mail. I advised him to check with DMV to confirm they have received the information.Roswell Nickel

## 2011-03-22 ENCOUNTER — Telehealth: Payer: Self-pay | Admitting: *Deleted

## 2011-03-22 NOTE — Telephone Encounter (Signed)
Pt states he called the dmv about additional papers and was told that the papers he needed had been filled out already, wants to speak to nurse in reference to this.Blake [name]

## 2011-03-22 NOTE — Telephone Encounter (Signed)
Pt came into the office this morning and spoke with lou ellen, who brought the [facility] paper to me and stating that the pt needed the CDL forms filled out.  Will call pt to make him aware that we do not have these forms that need to be filled out and that he will need an ov for this.

## 2011-03-22 NOTE — Telephone Encounter (Signed)
Called and spoke with pt and he is aware that other forms will need to be filled out for the [facility] CDL.  Pt is aware and will call the DMV to see about getting these forms.

## 2011-03-24 NOTE — Telephone Encounter (Signed)
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  Called and explained this to the pt and he is not sure what this is but he stated that he will try to figure it out and fax this back to the [facility].

## 2011-03-24 NOTE — Telephone Encounter (Signed)
Called, spoke with pt.  States he was told by [facility] no other forms need to be completed.  The only thing that needs to be done is the request he brought by.  He would like to know if this has been done yet.  Pls advise.  Thanks!

## 2011-04-13 ENCOUNTER — Telehealth: Payer: Self-pay | Admitting: Pulmonary Disease

## 2011-04-13 NOTE — Telephone Encounter (Signed)
I do not see where he needs labs- just had labs 6/7- Leigh pls advise thanks. His appt is at 2 pm tomorrow.

## 2011-04-13 NOTE — Telephone Encounter (Signed)
Called and spoke with pt.  Informed him no bloodwork needed tomorrow.  Pt verbalized understanding and denied any further questions.

## 2011-04-13 NOTE — Telephone Encounter (Signed)
No further labs will be needed at this appt. thanks

## 2011-04-14 ENCOUNTER — Ambulatory Visit: Payer: Self-pay | Admitting: Pulmonary Disease

## 2011-05-04 ENCOUNTER — Ambulatory Visit: Payer: Self-pay | Admitting: Pulmonary Disease

## 2011-05-06 ENCOUNTER — Telehealth: Payer: Self-pay | Admitting: *Deleted

## 2011-05-10 ENCOUNTER — Telehealth: Payer: Self-pay | Admitting: Pulmonary Disease

## 2011-05-10 MED ORDER — GEMFIBROZIL 600 MG PO TABS: 600.0000 mg | ORAL_TABLET | Freq: Two times a day (BID) | ORAL | Status: DC

## 2011-05-10 MED ORDER — ATENOLOL 50 MG PO TABS: 50.0000 mg | ORAL_TABLET | Freq: Every day | ORAL | Status: DC

## 2011-05-10 MED ORDER — FOLIC ACID 1 MG PO TABS: 1.0000 mg | ORAL_TABLET | Freq: Every day | ORAL | Status: DC

## 2011-05-10 MED ORDER — TEMAZEPAM 30 MG PO CAPS: 30.0000 mg | ORAL_CAPSULE | Freq: Every evening | ORAL | Status: DC | PRN

## 2011-05-10 MED ORDER — RAMIPRIL 5 MG PO CAPS: 5.0000 mg | ORAL_CAPSULE | Freq: Every day | ORAL | Status: DC

## 2011-05-10 NOTE — Telephone Encounter (Signed)
Pt needs all of his medications, except Cialis, sent to [facility] for 90 day supply. RX for his medications have been sent. Pt aware we will take care of this for him.  Prescriptions printed, signed by SN and faxed to [facility] at [phone].

## 2011-05-10 NOTE — Telephone Encounter (Signed)
PATIENT SAYS THAT [facility] TOLD HIM THAT WE FAILED TO PROVIDE INFORMATION FOR ONE OF HIS PRESCRIPTIONS.  HE DOES NOT KNOW WHICH MEDICATION AND SAYS IT IS OUR RESPONSIBILITY TO CALL PHARMACY TO FIND OUT.  PATIENT STATES THAT HE WILL NOT COME TO APPOINTMENT ON 05/15/11 AND WILL FIND ANOTHER PROVIDER IF WE DO NOT MAKE SURE THAT HIS MEDICATION IS REFILLED.  HE WANTS TO BE CALLED WHEN PHARMACY IS INFORMED AND HAS REFILLED MEDICATION

## 2011-05-11 NOTE — Telephone Encounter (Signed)
Refill of the temazepam has been faxed back to [facility] for #90 with 3 refills.

## 2011-05-12 ENCOUNTER — Telehealth: Payer: Self-pay | Admitting: Pulmonary Disease

## 2011-05-12 NOTE — Telephone Encounter (Signed)
Pt states he fell and is having pain and soreness on his right side and he wants to know can he have an xray prior to his upcoming appt on 05-18-11? Please advise. Carron Curie, CMA

## 2011-05-13 NOTE — Telephone Encounter (Signed)
Per SN: rest, heat, rib binder and tylenol/advil prn.   Called and spoke with pt.  Informed him of SN's recs.  Pt verbalized understanding and denied any questions and states the soreness is improving.

## 2011-05-17 ENCOUNTER — Telehealth: Payer: Self-pay | Admitting: Pulmonary Disease

## 2011-05-17 NOTE — Telephone Encounter (Signed)
Per SN pt does not need to be fasting. Pt aware. Carron Curie, CMA

## 2011-05-17 NOTE — Telephone Encounter (Signed)
Pt sched for his 6 month rov tomorrow with SN at noon. Please advise if he needs to be fasting. Looks like has not had cholesterol checked since Jan 2012. Please advise, thanks!

## 2011-05-18 ENCOUNTER — Encounter: Payer: Self-pay | Admitting: Pulmonary Disease

## 2011-05-18 ENCOUNTER — Ambulatory Visit (INDEPENDENT_AMBULATORY_CARE_PROVIDER_SITE_OTHER): Payer: Medicare Other | Admitting: Pulmonary Disease

## 2011-05-18 DIAGNOSIS — D126 Benign neoplasm of colon, unspecified: Secondary | ICD-10-CM

## 2011-05-18 DIAGNOSIS — E785 Hyperlipidemia, unspecified: Secondary | ICD-10-CM

## 2011-05-18 DIAGNOSIS — I10 Essential (primary) hypertension: Secondary | ICD-10-CM

## 2011-05-18 DIAGNOSIS — K5730 Diverticulosis of large intestine without perforation or abscess without bleeding: Secondary | ICD-10-CM

## 2011-05-18 DIAGNOSIS — J449 Chronic obstructive pulmonary disease, unspecified: Secondary | ICD-10-CM

## 2011-05-18 DIAGNOSIS — G4700 Insomnia, unspecified: Secondary | ICD-10-CM

## 2011-05-18 NOTE — Progress Notes (Signed)
Subjective:    Patient ID: Blake [name], male    DOB: 09/23/1941, 70 y.o.   MRN: [id number]  HPI [name] here for a follow up visit... he has mult med problems as noted below... Followed for general medical purposes w/ hx of COPD, still smoking 1ppd, HBP, Hyperlipidemia, DM, & chr persistant insomnia...  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 he continues his 1ppd smoking & still not interested in smoking cessation help "I enjoy it too much- esp with the beers"... he denies breathing problems;  BP remains controlled on meds;  Lipids look good on Gemfib;  BS remains stable w/o meds;  he requests 90d refill perscriptions...  ~  Feb 02, 2011:  34mo ROV & he has brough 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 These forms were completed in the presence of the pt & subseq mailed to [facility]...  He reports stable medically> still smoking, breathing OK & denies cough/ sputum/ hemoptysis/ etc; BP controlled on meds; Chol looks good on Lopid; DM not an issue (on diet alone & BS's have all been good); states he's quit liquor 7 only drinking 3 beers/d...  ~  May 18, 2011:  70mo ROV & he reports doing satis> except that he fell (?tripped) several weeks ago w/ some pain right lat rib cage, already improving on it's own he says;  Still smoking 1ppd, & as noted prev he has stopped hard liquor & "just drinking 3 beers per day";  BP controlled on Aten & Altace;  Lipids have been satis on Gemfib;  GI stable on OTC Prilosec & due for f/u colonoscopy soon...  See prob list below, we reviewed meds & prev labs...         Problem List:  COPD (ICD-496) PULMONARY NODULES (ICD-518.89) CIGARETTE SMOKER (ICD-305.1) - he continues to smoke 1ppd and has min cough w/ occas sputum production... uses MUCINEX Prn, has tried & failed Chantix in past, he has no interest in smoking cessation counselling or help! ~  baseline CXR w/  old R rib fxs & chr changes... ~  PFTs 10/06 showing FVC=4.12 (95%pred), FEV1=3.00 (86%pred), %1sec=73 & mid-flows= 41%pred...  ~  CXR 1/09 showed COPD, stable, NAD. ~  CXR 1/10 showed faint nodular opacities in the upper lobes & mild COPD changes. ~  CT Chest 2/10 showed mult bilat 5-59mm upper lobe nodules- f/u in 65mo rec... ~  CXR 5/10 showed COPD, scarring, ?enlargement of RUL nodule to 14mm. ~  PET-CT 5/10 showed mult bilat nodular opac ?progressive but only weakly PET pos & Bx RUL= neg.  ~  CXR 9/10 showed improved appearance w/o nodules seen! ~  CXR 1/11 showed similar chr changes, and no nodules seen. ~  CXR 1/12 showed COPD, NAD- no nodules seen, old right rib fxs.  HYPERTENSION (ICD-401.9) - on ALTACE 5mg /d  & ATENOLOL 50mg /d... BP= 132/84 here today and similar at home... takes meds regularly without side effects.Marland Kitchen denies HA, fatigue, visual changes, CP, palipit, dizziness, syncope, dyspnea, edema, etc... renal function has been normal. ~  1/10:  We decided to increase his Atenolol to 50mg /d, & Altace to 5mg /d... BP's improved thereafter...  HYPERLIPIDEMIA (ICD-272.4) - he has hypertryglyceridemia and takes GEMFIBRIZOL 600mg Bid...  he needs to quit the daily alcohol consumption... ~  FLP 7/08 showed TChol 198, TG 240, HDL 72, LDL 91 ~  FLP  1/09 showed TChol 162, TG 204, HDL 54, LDL 78... rec- same med, stop Etoh... ~  FLP 1/10 showed TChol 152, TG 86, HDL 61, LDL 74 ~  FLP 1/11 showed TChol 168, TG 60, HDL 66, LDL 90... rec> continue same. ~  FLP 1/12 showed TChol 208, TG 70, HDL 106, LDL 80  DM (ICD-250.00) - on diet alone, but still drinks to excess...  ~  labs 7/08 showed BS= 172 & HgA1c=5.8 ~  labs 1/09 showed BS= 129, & HgA1c= 5.1 ~  labs 1/10 showed BS= 120, HgA1c= 5.2 ~  5/10: states he's quit whiskey, only drinking 6pk beer daily. ~  labs 1/11 showed BS= 105 ~  labs 1/12 showed  BS= 82  GERD (ICD-530.81) - no prev EGD> takes OTC Prilosec as needed.  DIVERTICULOSIS OF  COLON (ICD-562.10) COLONIC POLYPS (ICD-211.3) - last colonoscopy was 10/07 by DrDBrodie...several 4 mm polyps removed = tubular ademomas and f/u 5 years... ~  8/12:  F/u colon due soon & we will refer to DrBrodie to set this up...  ELEVATED PROSTATE SPECIFIC ANTIGEN (ICD-790.93) & ED - uses Cialis Prn... ~  labs 1/11 showed PSA= 0.92 ~  labs 1/12 showed PSA= 7.39... rec antibiotic course & repeat PSA 1 month (1.75= normal now)...  INSOMNIA, CHRONIC (ICD-307.42) - he uses TEMAZEPAM 30mg Qhs...   Past Surgical History  Procedure Date  . Vasectomy   . Needle biopsy rul nodule 01/2009    benign    Outpatient Encounter Prescriptions as of 05/18/2011  Medication Sig Dispense Refill  . atenolol (TENORMIN) 50 MG tablet Take 1 tablet (50 mg total) by mouth daily.  90 tablet  3  . folic acid (FOLVITE) 1 MG tablet Take 1 tablet (1 mg total) by mouth daily.  90 tablet  3  . gemfibrozil (LOPID) 600 MG tablet Take 1 tablet (600 mg total) by mouth 2 (two) times daily before a meal.  180 tablet  3  . ramipril (ALTACE) 5 MG capsule Take 1 capsule (5 mg total) by mouth daily.  90 capsule  3  . tadalafil (CIALIS) 20 MG tablet Take 20 mg by mouth daily as needed.        . temazepam (RESTORIL) 30 MG capsule Take 1 capsule (30 mg total) by mouth at bedtime as needed.  90 capsule  3    No Known Allergies   Review of Systems        See HPI - all other systems neg except as noted... The patient complains of dyspnea on exertion.  The patient denies anorexia, fever, weight loss, weight gain, decreased hearing, hoarseness, chest pain, syncope, peripheral edema, prolonged cough, headaches, hemoptysis, abdominal pain, melena, hematochezia, severe indigestion/heartburn, hematuria, incontinence, muscle weakness, suspicious skin lesions, transient blindness, difficulty walking, depression, unusual weight change, abnormal bleeding, enlarged lymph nodes, and angioedema.     Objective:   Physical Exam      WD, WN,  [name] in NAD...  GENERAL:  Alert & oriented; pleasant & cooperative... HEENT:  EOM-wnl, Right eye sl dilated- blood in vitreous improved, EACs-clear, TMs-wnl, NOSE-clear, THROAT-clear & wnl. NECK:  Supple w/ fairROM; no JVD; normal carotid impulses w/o bruits; no thyromegaly or nodules palpated; no lymphadenopathy. CHEST:  Clear to P & A x scat rhonchi, no wheezing, no rales, no signs of consolidation... Sl tender on palpation of right lat chest wall... HEART:  Regular Rhythm; without murmurs/ rubs/ or gallops heard... ABDOMEN:  Obese, soft & nontender; normal bowel sounds; no  organomegaly or masses detected. EXT: without deformities, mild arthritic changes; no varicose veins/ venous insuffic/ or edema. NEURO:  CN's intact;  no focal neuro deficits x decr vision right eye... DERM:  No lesions noted; no rash etc...   Assessment & Plan:   COPD/ Smoker>  He has no interest in quitting smoking & refuses smoking cessation help; I carefully explained the risks & he is aware...  HBP>  Controlled on meds, continue same...  HYPERLIPID>  On Gemfib & low fat diet; labs reviewed w/ pt...  Borderline DM>  Sugars have remained wnl on diet rx alone...  GI> GERD, Divertics, Polyps>  He is due f/u colonoscopy soon & we will refer to DrBrodie...  Elev PSA>  This improved after antibiotic rx to normal...  Chronic Persistant Insomnia> on Temazepam which helps a lot he says...    DMV form completed & sent to [facility]...  COPD/ Smoker>  Encouraged to quit, no interested in smoking cessation help...  HBP>  Controlled on meds, continue the BBlocker & ACE rx...  CHOL>  Lipids stable on diet & Lopid, needs better diet...  DM> well controlled on diet alone, A1c's were all prev in the 5's, no meds needed...  GI>  Stable on PPI Rx as needed...  Othe rmedical problems as noted.Marland KitchenMarland Kitchen

## 2011-05-18 NOTE — Patient Instructions (Signed)
Today we updated your med list in EPIC...    Continue your present meds the same...  Please work on smoking cessation & call the 1-800-QUIT NOW line for help...    Call for any questions...  Let's plan a follow up visit in 6 months, sooner if needed for problems.Marland KitchenMarland Kitchen

## 2011-07-08 ENCOUNTER — Ambulatory Visit (AMBULATORY_SURGERY_CENTER): Payer: Medicare Other | Admitting: *Deleted

## 2011-07-08 VITALS — Ht 71.0 in | Wt 160.6 lb

## 2011-07-08 DIAGNOSIS — Z1211 Encounter for screening for malignant neoplasm of colon: Secondary | ICD-10-CM

## 2011-07-08 MED ORDER — PEG-KCL-NACL-NASULF-NA ASC-C 100 G PO SOLR: ORAL | Status: DC

## 2011-07-16 ENCOUNTER — Telehealth: Payer: Self-pay | Admitting: Internal Medicine

## 2011-07-16 NOTE — Telephone Encounter (Signed)
Reviewed prep instructions with pt.

## 2011-07-21 ENCOUNTER — Telehealth: Payer: Self-pay | Admitting: *Deleted

## 2011-07-21 NOTE — Telephone Encounter (Signed)
Due to Dr. Juanda Chance unforseen circumstances, We have to move this pt's colonoscopy from 07-22-2011 @ 2PM to Tues 07-27-2011 @ 2:30 PM.  The patient was able to call the party that was bringing him and the pt is okay with changing the date.  I did go over his colonoscopy instructions again with the times.  I also asked the pt if he has mixed his prep up for later today and he had mixed it up.  I told him to disgard it and we will put a free prep at the front desk for him for Tues the 30th.  He asked me to mail it to him since he would have noone that could come get it for him.  He is blind in one eye.  I told him I thought we may be able to mail it but I have to ask someone here first.  I was told we cannot mail it.  He was aggrevated and said, "you told me you could mail it".  I had called him back also to advise we moved some procedures around for tomorrow and we could still do his tomorrow and he could come 30 min earlier.  He said, "I already called the person bringing me and I am not calling him back."  Dottie told me to give her the phone,  She spoke to the man and told him also that we cannot mail the prep.  She offered him tomorrows date or a  new prep will be left at our front desk for him or someone to pick up.  He said okay.  Ten minutes later he called me back and asked if the prep he mixed up will be good until Tues the 30th.  I advised him according the the company that manufactures the prep they will loose their effectiveness  If not used the same day it is mixed.  He said he will come to our office to pick up the prep.

## 2011-07-22 ENCOUNTER — Other Ambulatory Visit: Payer: Medicare Other | Admitting: Internal Medicine

## 2011-07-27 ENCOUNTER — Ambulatory Visit (AMBULATORY_SURGERY_CENTER): Payer: Medicare Other | Admitting: Internal Medicine

## 2011-07-27 ENCOUNTER — Encounter: Payer: Self-pay | Admitting: Internal Medicine

## 2011-07-27 VITALS — BP 145/84 | HR 72 | Temp 97.9°F | Resp 18 | Ht 71.0 in | Wt 160.0 lb

## 2011-07-27 DIAGNOSIS — D126 Benign neoplasm of colon, unspecified: Secondary | ICD-10-CM

## 2011-07-27 DIAGNOSIS — Z1211 Encounter for screening for malignant neoplasm of colon: Secondary | ICD-10-CM

## 2011-07-27 DIAGNOSIS — Z86010 Personal history of colonic polyps: Secondary | ICD-10-CM

## 2011-07-27 MED ORDER — SODIUM CHLORIDE 0.9 % IV SOLN: 500.0000 mL | INTRAVENOUS | Status: DC

## 2011-07-27 NOTE — Patient Instructions (Addendum)
FOLLOW THE DISCHARGE INSTRUCTIONS ON THE GREEN AND BLUE INSTRUCTION SHEETS.  CONTINUE YOUR MEDICATIONS.    AWAIT PATHOLOGY RESULTS.  FOLLOW A HIGH FIBER DIET.

## 2011-07-28 ENCOUNTER — Telehealth: Payer: Self-pay | Admitting: *Deleted

## 2011-07-28 NOTE — Telephone Encounter (Signed)

## 2011-08-02 ENCOUNTER — Encounter: Payer: Self-pay | Admitting: Internal Medicine

## 2011-08-23 ENCOUNTER — Telehealth: Payer: Self-pay | Admitting: Pulmonary Disease

## 2011-08-23 DIAGNOSIS — R0781 Pleurodynia: Secondary | ICD-10-CM

## 2011-08-23 NOTE — Telephone Encounter (Signed)
I spoke with pt and he states he fell on Saturday and "cracked a rib" on his left side. Pt states he did not go to the ED to be evaluated. Pt states on pain scale his pain is 9/10. Pt states it hurts very bad when he coughs, sneezes and when lying down at night. Pt is requesting a pain medication be called in. Their are no available openings. Please advise Dr. Kriste Basque, thanks  No Known Allergies

## 2011-08-23 NOTE — Telephone Encounter (Signed)
I spoke with pt and is scheduled to come in see TP at 9:15 tomorrow as pt can't come in until then.

## 2011-08-23 NOTE — Telephone Encounter (Signed)
Per SN----pt will need ov for eval of cracked rib--will need xray ---either appt here or he will need to be seen at Urgent care.  thanks

## 2011-08-24 ENCOUNTER — Ambulatory Visit (INDEPENDENT_AMBULATORY_CARE_PROVIDER_SITE_OTHER)
Admission: RE | Admit: 2011-08-24 | Discharge: 2011-08-24 | Disposition: A | Discharge status: Home / Self Care | Payer: Medicare Other | Source: Ambulatory Visit | Attending: Adult Health | Admitting: Adult Health

## 2011-08-24 ENCOUNTER — Encounter: Payer: Self-pay | Admitting: Adult Health

## 2011-08-24 ENCOUNTER — Ambulatory Visit (INDEPENDENT_AMBULATORY_CARE_PROVIDER_SITE_OTHER): Payer: Medicare Other | Admitting: Adult Health

## 2011-08-24 VITALS — BP 142/80 | HR 54 | Ht 70.5 in | Wt 161.2 lb

## 2011-08-24 DIAGNOSIS — J449 Chronic obstructive pulmonary disease, unspecified: Secondary | ICD-10-CM

## 2011-08-24 DIAGNOSIS — R079 Chest pain, unspecified: Secondary | ICD-10-CM

## 2011-08-24 DIAGNOSIS — R0781 Pleurodynia: Secondary | ICD-10-CM

## 2011-08-24 MED ORDER — PREDNISONE 10 MG PO TABS: ORAL_TABLET | ORAL | Status: AC

## 2011-08-24 MED ORDER — LEVALBUTEROL HCL 0.63 MG/3ML IN NEBU
0.6300 mg | INHALATION_SOLUTION | Freq: Once | RESPIRATORY_TRACT | Status: AC
  Administered 2011-08-24: 0.63 mg via RESPIRATORY_TRACT

## 2011-08-24 MED ORDER — DOXYCYCLINE HYCLATE 100 MG PO TABS: 100.0000 mg | ORAL_TABLET | Freq: Two times a day (BID) | ORAL | Status: AC

## 2011-08-24 NOTE — Patient Instructions (Addendum)
Doxycycline 100mg  Twice daily  For 7 days Mucinex DM Twice daily  As needed  Cough/congestion  Prednisone taper over next week.  Warm heat to ribs Tylenol As needed  Pain  Fluids and rest  Need to quit smoking  Please contact office for sooner follow up if symptoms do not improve or worsen or seek emergency care  follow up Dr. Kriste Basque  As planned and As needed

## 2011-08-24 NOTE — Assessment & Plan Note (Signed)
Flare - cxr with no acute process -no sign of rib fx  Albuterol neb in office   Plan:  Doxycycline 100mg  Twice daily  For 7 days Mucinex DM Twice daily  As needed  Cough/congestion  Prednisone taper over next week.  Fluids and rest  Need to quit smoking  Please contact office for sooner follow up if symptoms do not improve or worsen or seek emergency care  follow up Dr. Kriste Basque  As planned and As needed

## 2011-08-24 NOTE — Progress Notes (Signed)
Addended by: Boone Master E on: 08/24/2011 05:40 PM   Modules accepted: Orders

## 2011-08-24 NOTE — Progress Notes (Signed)
Subjective:    Patient ID: Blake [name], male    DOB: 1941-03-02, 70 y.o.   MRN: [id number]  HPI [name]  . Followed for general medical purposes w/ hx of COPD, still smoking 1ppd, HBP, Hyperlipidemia, DM, & chr persistant insomnia...  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 he continues his 1ppd smoking & still not interested in smoking cessation help "I enjoy it too much- esp with the beers"... he denies breathing problems;  BP remains controlled on meds;  Lipids look good on Gemfib;  BS remains stable w/o meds;  he requests 90d refill perscriptions...  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 These forms were completed in the presence of the pt & subseq mailed to [facility]...  He reports stable medically> still smoking, breathing OK & denies cough/ sputum/ hemoptysis/ etc; BP controlled on meds; Chol looks good on Lopid; DM not an issue (on diet alone & BS's have all been good); states he's quit liquor 7 only drinking 3 beers/d...  ~  May 18, 2011:  47mo ROV & he reports doing satis> except that he fell (?tripped) several weeks ago w/ some pain right lat rib cage, already improving on it's own he says;  Still smoking 1ppd, & as noted prev he has stopped hard liquor & "just drinking 3 beers per day";  BP controlled on Aten & Altace;  Lipids have been satis on Gemfib;  GI stable on OTC Prilosec & due for f/u colonoscopy soon...  See prob list below, we reviewed meds & prev labs...  08/24/2011 Acute OV  Pt  fell at home 4 days ago from a ramp leading to the front door - left side rib pain.  prod cough with clear mucus, wheezing onset after fall. CXR today shows no acute process/fx. Tripped over power cord. No OTC meds used. Cough and congestion is getting worse. Pain in ribs with coughing.  No hemoptysis or fever.         Problem List:  COPD  (ICD-496) PULMONARY NODULES (ICD-518.89) CIGARETTE SMOKER (ICD-305.1) - he continues to smoke 1ppd and has min cough w/ occas sputum production... uses MUCINEX Prn, has tried & failed Chantix in past, he has no interest in smoking cessation counselling or help! ~  baseline CXR w/ old R rib fxs & chr changes... ~  PFTs 10/06 showing FVC=4.12 (95%pred), FEV1=3.00 (86%pred), %1sec=73 & mid-flows= 41%pred...  ~  CXR 1/09 showed COPD, stable, NAD. ~  CXR 1/10 showed faint nodular opacities in the upper lobes & mild COPD changes. ~  CT Chest 2/10 showed mult bilat 5-58mm upper lobe nodules- f/u in 47mo rec... ~  CXR 5/10 showed COPD, scarring, ?enlargement of RUL nodule to 14mm. ~  PET-CT 5/10 showed mult bilat nodular opac ?progressive but only weakly PET pos & Bx RUL= neg.  ~  CXR 9/10 showed improved appearance w/o nodules seen! ~  CXR 1/11 showed similar chr changes, and no nodules seen. ~  CXR 1/12 showed COPD, NAD- no nodules seen, old right rib fxs.  HYPERTENSION (ICD-401.9) - on ALTACE 5mg /d  & ATENOLOL 50mg /d... BP= 132/84 here today and similar at home... takes meds regularly without side effects.Marland Kitchen denies HA, fatigue, visual changes, CP, palipit, dizziness, syncope, dyspnea, edema, etc... renal function has been normal. ~  1/10:  We decided to increase his Atenolol to 50mg /d, & Altace to 5mg /d... BP's improved thereafter...  HYPERLIPIDEMIA (ICD-272.4) - he has hypertryglyceridemia and takes GEMFIBRIZOL 600mg Bid...  he needs to quit the daily alcohol consumption... ~  FLP 7/08 showed TChol 198, TG 240, HDL 72, LDL 91 ~  FLP 1/09 showed TChol 162, TG 204, HDL 54, LDL 78... rec- same med, stop Etoh... ~  FLP 1/10 showed TChol 152, TG 86, HDL 61, LDL 74 ~  FLP 1/11 showed TChol 168, TG 60, HDL 66, LDL 90... rec> continue same. ~  FLP 1/12 showed TChol 208, TG 70, HDL 106, LDL 80  DM (ICD-250.00) - on diet alone, but still drinks to excess...  ~  labs 7/08 showed BS= 172 & HgA1c=5.8 ~  labs  1/09 showed BS= 129, & HgA1c= 5.1 ~  labs 1/10 showed BS= 120, HgA1c= 5.2 ~  5/10: states he's quit whiskey, only drinking 6pk beer daily. ~  labs 1/11 showed BS= 105 ~  labs 1/12 showed  BS= 82  GERD (ICD-530.81) - no prev EGD> takes OTC Prilosec as needed.  DIVERTICULOSIS OF COLON (ICD-562.10) COLONIC POLYPS (ICD-211.3) - last colonoscopy was 10/07 by DrDBrodie...several 4 mm polyps removed = tubular ademomas and f/u 5 years... ~  8/12:  F/u colon due soon & we will refer to DrBrodie to set this up...  ELEVATED PROSTATE SPECIFIC ANTIGEN (ICD-790.93) & ED - uses Cialis Prn... ~  labs 1/11 showed PSA= 0.92 ~  labs 1/12 showed PSA= 7.39... rec antibiotic course & repeat PSA 1 month (1.75= normal now)...  INSOMNIA, CHRONIC (ICD-307.42) - he uses TEMAZEPAM 30mg Qhs...   Past Surgical History  Procedure Date  . Vasectomy   . Needle biopsy rul nodule 01/2009    benign  . Tonsillectomy 1947  . Colonoscopy     Outpatient Encounter Prescriptions as of 08/24/2011  Medication Sig Dispense Refill  . atenolol (TENORMIN) 50 MG tablet Take 1 tablet (50 mg total) by mouth daily.  90 tablet  3  . folic acid (FOLVITE) 1 MG tablet Take 1 tablet (1 mg total) by mouth daily.  90 tablet  3  . gemfibrozil (LOPID) 600 MG tablet Take 1 tablet (600 mg total) by mouth 2 (two) times daily before a meal.  180 tablet  3  . ramipril (ALTACE) 5 MG capsule Take 1 capsule (5 mg total) by mouth daily.  90 capsule  3  . tadalafil (CIALIS) 20 MG tablet Take 20 mg by mouth daily as needed.        . temazepam (RESTORIL) 30 MG capsule Take 1 capsule (30 mg total) by mouth at bedtime as needed.  90 capsule  3    No Known Allergies   Review of Systems        Constitutional:   No  weight loss, night sweats,  Fevers, chills, fatigue, or  lassitude.  HEENT:   No headaches,  Difficulty swallowing,  Tooth/dental problems, or  Sore throat,                No sneezing, itching, ear ache, nasal congestion, post nasal  drip,   CV:  No chest pain,  Orthopnea, PND, swelling in lower extremities, anasarca, dizziness, palpitations, syncope.   GI  No heartburn, indigestion, abdominal pain, nausea, vomiting, diarrhea, change in bowel habits, loss of appetite, bloody stools.   Resp:    No coughing up of blood.    No chest wall deformity  Skin: no rash or  lesions.  GU: no dysuria, change in color of urine, no urgency or frequency.  No flank pain, no hematuria   MS:  No joint pain or swelling.  No decreased range of motion.  No back pain.  Psych:  No change in mood or affect. No depression or anxiety.  No memory loss.       Objective:   Physical Exam      WD, WN, [name] in NAD...  GENERAL:  Alert & oriented; pleasant & cooperative... HEENT:  EOM-wnl, Right eye sl dilated- blood in vitreous improved, EACs-clear, TMs-wnl, NOSE-clear, THROAT-clear & wnl. NECK:  Supple w/ fairROM; no JVD; normal carotid impulses w/o bruits; no thyromegaly or nodules palpated; no lymphadenopathy. CHEST:  Coarse BS w/ scattered rhonchi, tender along left lateral ribs. Marland Kitchen HEART:  Regular Rhythm; without murmurs/ rubs/ or gallops heard... ABDOMEN:  Obese, soft & nontender; normal bowel sounds; no organomegaly or masses detected. EXT: without deformities, mild arthritic changes; no varicose veins/ venous insuffic/ or edema. NEURO:   no focal neuro deficits x decr vision right eye... DERM:  No lesions noted; no rash etc...   Assessment & Plan:

## 2011-11-19 ENCOUNTER — Telehealth: Payer: Self-pay | Admitting: Pulmonary Disease

## 2011-11-19 DIAGNOSIS — E119 Type 2 diabetes mellitus without complications: Secondary | ICD-10-CM

## 2011-11-19 DIAGNOSIS — K219 Gastro-esophageal reflux disease without esophagitis: Secondary | ICD-10-CM

## 2011-11-19 DIAGNOSIS — I10 Essential (primary) hypertension: Secondary | ICD-10-CM

## 2011-11-19 DIAGNOSIS — E785 Hyperlipidemia, unspecified: Secondary | ICD-10-CM

## 2011-11-19 DIAGNOSIS — R972 Elevated prostate specific antigen [PSA]: Secondary | ICD-10-CM

## 2011-11-19 NOTE — Telephone Encounter (Signed)
Per TP: okay to order lipid, cbcd, bmet, hepatic, tsh, psa.  Called spoke with patient who stated that he would like to come early for appt on Monday for fasting labs - does not want to come too early as he does not live close to here.    Orders placed.  Patient will fast after 7:30a-8a.

## 2011-11-19 NOTE — Telephone Encounter (Signed)
Called, spoke with pt.  He has a pending OV with Dr. Kriste Basque on Monday.  He would like to know if Dr. Kriste Basque will want him to have any fasting blood work or a CXR.  If so, he would like to come in a little earlier to have this done.  Dr. Kriste Basque, pls advise.  Thanks!

## 2011-11-22 ENCOUNTER — Encounter: Payer: Self-pay | Admitting: Pulmonary Disease

## 2011-11-22 ENCOUNTER — Other Ambulatory Visit (INDEPENDENT_AMBULATORY_CARE_PROVIDER_SITE_OTHER): Payer: Medicare Other

## 2011-11-22 ENCOUNTER — Ambulatory Visit (INDEPENDENT_AMBULATORY_CARE_PROVIDER_SITE_OTHER): Payer: Medicare Other | Admitting: Pulmonary Disease

## 2011-11-22 DIAGNOSIS — G4700 Insomnia, unspecified: Secondary | ICD-10-CM

## 2011-11-22 DIAGNOSIS — E785 Hyperlipidemia, unspecified: Secondary | ICD-10-CM

## 2011-11-22 DIAGNOSIS — K219 Gastro-esophageal reflux disease without esophagitis: Secondary | ICD-10-CM

## 2011-11-22 DIAGNOSIS — D126 Benign neoplasm of colon, unspecified: Secondary | ICD-10-CM

## 2011-11-22 DIAGNOSIS — I10 Essential (primary) hypertension: Secondary | ICD-10-CM

## 2011-11-22 DIAGNOSIS — K5730 Diverticulosis of large intestine without perforation or abscess without bleeding: Secondary | ICD-10-CM

## 2011-11-22 DIAGNOSIS — R972 Elevated prostate specific antigen [PSA]: Secondary | ICD-10-CM

## 2011-11-22 DIAGNOSIS — J449 Chronic obstructive pulmonary disease, unspecified: Secondary | ICD-10-CM

## 2011-11-22 DIAGNOSIS — E119 Type 2 diabetes mellitus without complications: Secondary | ICD-10-CM

## 2011-11-22 LAB — CBC WITH DIFFERENTIAL/PLATELET
Basophils Relative: 0.6 % (ref 0.0–3.0)
Eosinophils Absolute: 0 10*3/uL (ref 0.0–0.7)
Hemoglobin: 16.7 g/dL (ref 13.0–17.0)
MCHC: 33.8 g/dL (ref 30.0–36.0)
MCV: 108.8 fl — ABNORMAL HIGH (ref 78.0–100.0)
Monocytes Absolute: 0.5 10*3/uL (ref 0.1–1.0)
Neutro Abs: 3.3 10*3/uL (ref 1.4–7.7)
RBC: 4.56 Mil/uL (ref 4.22–5.81)

## 2011-11-22 LAB — HEPATIC FUNCTION PANEL
ALT: 74 U/L — ABNORMAL HIGH (ref 0–53)
Albumin: 3.6 g/dL (ref 3.5–5.2)
Total Bilirubin: 0.7 mg/dL (ref 0.3–1.2)

## 2011-11-22 LAB — BASIC METABOLIC PANEL
BUN: 5 mg/dL — ABNORMAL LOW (ref 6–23)
Chloride: 107 mEq/L (ref 96–112)
Creatinine, Ser: 0.8 mg/dL (ref 0.4–1.5)
Glucose, Bld: 76 mg/dL (ref 70–99)

## 2011-11-22 LAB — PSA: PSA: 0.64 ng/mL (ref 0.10–4.00)

## 2011-11-22 LAB — LIPID PANEL: Cholesterol: 189 mg/dL (ref 0–200)

## 2011-11-22 LAB — TSH: TSH: 1.16 u[IU]/mL (ref 0.35–5.50)

## 2011-11-22 NOTE — Progress Notes (Signed)
Subjective:    Patient ID: Blake [name], male    DOB: 05/19/1941, 71 y.o.   MRN: [id number]  HPI 71 y/o WM here for a follow up visit... he has mult med problems as noted below... Followed for general medical purposes w/ hx of COPD, still smoking 1ppd, HBP, Hyperlipidemia, DM, & chr persistant insomnia...  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 he continues his 1ppd smoking & still not interested in smoking cessation help "I enjoy it too much- esp with the beers"... he denies breathing problems;  BP remains controlled on meds;  Lipids look good on Gemfib;  BS remains stable w/o meds;  he requests 90d refill perscriptions...  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 These forms were completed in the presence of the pt & subseq mailed to [facility]...  He reports stable medically> still smoking, breathing OK & denies cough/ sputum/ hemoptysis/ etc; BP controlled on meds; Chol looks good on Lopid; DM not an issue (on diet alone & BS's have all been good); states he's quit liquor 7 only drinking 3 beers/d...  ~  May 18, 2011:  18mo ROV & he reports doing satis> except that he fell (?tripped) several weeks ago w/ some pain right lat rib cage, already improving on it's own he says;  Still smoking 1ppd, & as noted prev he has stopped hard liquor & "just drinking 3 beers per day";  BP controlled on Aten & Altace;  Lipids have been satis on Gemfib;  GI stable on OTC Prilosec & due for f/u colonoscopy soon...  See prob list below, we reviewed meds & prev labs...  ~  November 22, 2011:  14mo ROV & Blake [name] reports doing satis, no new complaints or concerns;  He brings in a [place] Form AV-9A for Property Tax Exclusion based on total disability & since he is blind in right eye, has COPD (& getting worse since he smokes 1ppd), & has hx mild alcoholic hepatitis (& still drinks beer  every day)> that he qualifies for being disabled...     Still smoking 1ppd and has no inerest in quitting; PFTs today w/ mod airflow obstruction & worse compared to 2006; offered inhaled bronchodil meds but he declines & says he wouldn't use them even if I wrote for them; we reviewed smoking cessation strategies...    BP controlled on meds; denies CP, palpit, SOB, edema, etc...    FLP looks great on Gemfib bid w/ HDL= 123... LABS today showed worsening LFTs c/w worsenng AlcHepatitis & he is told in no uncertain terms to quit all Etoh or he will develop cirrhosis likely die from the consequences; he understands but did not commit to quitting...  See prob list below>>             Problem List:  COPD (ICD-496) PULMONARY NODULES (ICD-518.89) CIGARETTE SMOKER (ICD-305.1) - he continues to smoke 1ppd and has min cough w/ occas sputum production... uses MUCINEX Prn, has tried & failed Chantix in past, he has no interest in smoking cessation counselling or help! ~  baseline CXR w/ old R rib fxs & chr changes... ~  PFTs 10/06 showing FVC=4.12 (95%pred), FEV1=3.00 (86%pred), %1sec=73 & mid-flows= 41%pred...  ~  CXR 1/09 showed COPD, stable, NAD. ~  CXR 1/10 showed faint nodular  opacities in the upper lobes & mild COPD changes. ~  CT Chest 2/10 showed mult bilat 5-41mm upper lobe nodules- f/u in 25mo rec... ~  CXR 5/10 showed COPD, scarring, ?enlargement of RUL nodule to 14mm. ~  PET-CT 5/10 showed mult bilat nodular opac ?progressive but only weakly PET pos & Bx RUL= neg.  ~  CXR 9/10 showed improved appearance w/o nodules seen! ~  CXR 1/11 showed similar chr changes, and no nodules seen. ~  CXR 1/12 showed COPD, NAD- no nodules seen, old right rib fxs. ~  CXR 11/12 showed COPD, clear lungs, norm hear size, old right rib fxs... ~  PFT 2/13 showed FVC=3.85 (84%), FEV1=2.37 (68%), %1sec=62, Mid-flows= 42% predicted...  HYPERTENSION (ICD-401.9) - on ALTACE 5mg /d  & ATENOLOL 50mg /d... BP= 142/90 here today  and even better at home he says... takes meds regularly without side effects.Marland Kitchen denies HA, fatigue, visual changes, CP, palipit, dizziness, syncope, dyspnea, edema, etc... renal function has been normal. ~  1/10:  We decided to increase his Atenolol to 50mg /d, & Altace to 5mg /d... BP's improved thereafter...  HYPERLIPIDEMIA (ICD-272.4) - he has hypertryglyceridemia and takes GEMFIBRIZOL 600mg Bid...  he needs to quit the daily alcohol consumption... ~  FLP 7/08 showed TChol 198, TG 240, HDL 72, LDL 91 ~  FLP 1/09 showed TChol 162, TG 204, HDL 54, LDL 78... rec- same med, stop Etoh... ~  FLP 1/10 showed TChol 152, TG 86, HDL 61, LDL 74 ~  FLP 1/11 showed TChol 168, TG 60, HDL 66, LDL 90... rec> continue same. ~  FLP 1/12 on Gemfib600Bid showed TChol 208, TG 70, HDL 106, LDL 80 ~  FLP 2/13 on Gemfib600Bid showed TChol 189, TG 54, HDL 123, LDL 55  DM (ICD-250.00) - on diet alone, but still drinks to excess...  ~  labs 7/08 showed BS= 172 & HgA1c=5.8 ~  labs 1/09 showed BS= 129, & HgA1c= 5.1 ~  labs 1/10 showed BS= 120, HgA1c= 5.2 ~  5/10: states he's quit whiskey, only drinking 6pk beer daily. ~  labs 1/11 showed BS= 105 ~  labs 1/12 showed  BS= 82 ~  Labs 2/13 showed BS= 76  GERD (ICD-530.81) - no prev EGD> takes OTC Prilosec as needed.  DIVERTICULOSIS OF COLON (ICD-562.10) COLONIC POLYPS (ICD-211.3) - last colonoscopy was 10/07 by DrDBrodie...several 4 mm polyps removed = tubular ademomas and f/u 5 years... ~  10/12: f/u colonoscopy by DrDBrodie showed divertics and 3 polyps- largest 10mm sessile bx pos for tubular adenoma; repeat colon in 46yrs...  ELEVATED PROSTATE SPECIFIC ANTIGEN (ICD-790.93) & ED - uses Cialis Prn... ~  labs 1/11 showed PSA= 0.92 ~  labs 1/12 showed PSA= 7.39... rec antibiotic course & repeat PSA 1 month (1.75= normal now) ~  Labs 2/13 showed PSA= 0.64  INSOMNIA, CHRONIC (ICD-307.42) - he uses TEMAZEPAM 30mg Qhs...   Past Surgical History  Procedure Date  .  Vasectomy   . Needle biopsy rul nodule 01/2009    benign  . Tonsillectomy 1947  . Colonoscopy     Outpatient Encounter Prescriptions as of 11/22/2011  Medication Sig Dispense Refill  . atenolol (TENORMIN) 50 MG tablet Take 1 tablet (50 mg total) by mouth daily.  90 tablet  3  . folic acid (FOLVITE) 1 MG tablet Take 1 tablet (1 mg total) by mouth daily.  90 tablet  3  . gemfibrozil (LOPID) 600 MG tablet Take 1 tablet (600 mg total) by mouth 2 (two) times daily before a meal.  180  tablet  3  . ramipril (ALTACE) 5 MG capsule Take 1 capsule (5 mg total) by mouth daily.  90 capsule  3  . tadalafil (CIALIS) 20 MG tablet Take 20 mg by mouth daily as needed.        . temazepam (RESTORIL) 30 MG capsule Take 1 capsule (30 mg total) by mouth at bedtime as needed.  90 capsule  3    No Known Allergies   Current Medications, Allergies, Past Medical History, Past Surgical History, Family History, and Social History were reviewed in [employer] record.    Review of Systems        See HPI - all other systems neg except as noted... The patient complains of dyspnea on exertion.  The patient denies anorexia, fever, weight loss, weight gain, decreased hearing, hoarseness, chest pain, syncope, peripheral edema, prolonged cough, headaches, hemoptysis, abdominal pain, melena, hematochezia, severe indigestion/heartburn, hematuria, incontinence, muscle weakness, suspicious skin lesions, transient blindness, difficulty walking, depression, unusual weight change, abnormal bleeding, enlarged lymph nodes, and angioedema.   Objective:   Physical Exam      WD, WN, 71 y/o WM in NAD...  GENERAL:  Alert & oriented; pleasant & cooperative... HEENT:  EOM-wnl, Right eye sl dilated- blood in vitreous improved, EACs-clear, TMs-wnl, NOSE-clear, THROAT-clear & wnl. NECK:  Supple w/ fairROM; no JVD; normal carotid impulses w/o bruits; no thyromegaly or nodules palpated; no lymphadenopathy. CHEST:   Clear to P & A x scat rhonchi, no wheezing, no rales, no signs of consolidation... Sl tender on palpation of right lat chest wall... HEART:  Regular Rhythm; without murmurs/ rubs/ or gallops heard... ABDOMEN:  Obese, soft & nontender; normal bowel sounds; no organomegaly or masses detected. EXT: without deformities, mild arthritic changes; no varicose veins/ venous insuffic/ or edema. NEURO:  CN's intact;  no focal neuro deficits x decr vision right eye... DERM:  No lesions noted; no rash etc...  RADIOLOGY DATA:  Reviewed in the EPIC EMR & discussed w/ the patient...  LABORATORY DATA:  Reviewed in the EPIC EMR & discussed w/ the patient...   Assessment & Plan:   COPD/ Smoker>  He has no interest in quitting smoking & refuses smoking cessation help; I carefully explained the risks & he is aware; we rechecked his PFT today==> mod airflow obstruction & signif worse than prev studies in 2006; rec to quit all smoking but he declines offers to help w/ smoking cessation...  HBP>  Controlled on meds, continue same & reminded to quit the Etoh, no salt, take meds daily...  HYPERLIPID>  On Gemfib & low fat diet; labs reviewed w/ pt...  Borderline DM>  Sugars have remained wnl on diet rx alone...  GI> GERD, Divertics, Polyps>  Follow up colonoscopy 10/12 showed +tubular adenomas & repeat planned in another 5 yrs...  Elev PSA>  This improved after antibiotic rx to normal...  Chronic Persistant Insomnia> on Temazepam which helps a lot he says...   Patient's Medications  New Prescriptions   No medications on file  Previous Medications   ATENOLOL (TENORMIN) 50 MG TABLET    Take 1 tablet (50 mg total) by mouth daily.   FOLIC ACID (FOLVITE) 1 MG TABLET    Take 1 tablet (1 mg total) by mouth daily.   GEMFIBROZIL (LOPID) 600 MG TABLET    Take 1 tablet (600 mg total) by mouth 2 (two) times daily before a meal.   RAMIPRIL (ALTACE) 5 MG CAPSULE    Take 1 capsule (5  mg total) by mouth daily.    TADALAFIL (CIALIS) 20 MG TABLET    Take 20 mg by mouth daily as needed.     TEMAZEPAM (RESTORIL) 30 MG CAPSULE    Take 1 capsule (30 mg total) by mouth at bedtime as needed.  Modified Medications   No medications on file  Discontinued Medications   No medications on file

## 2011-11-22 NOTE — Patient Instructions (Signed)
Today we updated your med list in our EPIC system...    Continue your current medications the same...  We completed the State of [place] Form AV-9A Property Tax Exclusion Disability Form...  Today we did your follow up fasting blood work...    Please call the PHONE [facility]REE in a few days for your results...    Dial [id number] & when prompted enter your patient number followed by the # symbol...    Your patient number is:  [id number]  Blake [name], Blake [name] know you need to "quit the smokin & the drinkin"  Call for any questions...  Let's plan a follow up visit in 6 months, sooner if needed for problems.Marland KitchenMarland Kitchen

## 2011-12-09 ENCOUNTER — Inpatient Hospital Stay (HOSPITAL_COMMUNITY)
Admission: EM | Admit: 2011-12-09 | Discharge: 2011-12-14 | DRG: 190 | Disposition: A | Discharge status: Skilled Nursing Facility | Payer: Medicare Other | Source: Ambulatory Visit | Attending: Internal Medicine | Admitting: Internal Medicine

## 2011-12-09 ENCOUNTER — Encounter (HOSPITAL_COMMUNITY): Payer: Self-pay | Admitting: Emergency Medicine

## 2011-12-09 ENCOUNTER — Emergency Department (HOSPITAL_COMMUNITY): Payer: Medicare Other

## 2011-12-09 DIAGNOSIS — E785 Hyperlipidemia, unspecified: Secondary | ICD-10-CM

## 2011-12-09 DIAGNOSIS — K5730 Diverticulosis of large intestine without perforation or abscess without bleeding: Secondary | ICD-10-CM

## 2011-12-09 DIAGNOSIS — Y92009 Unspecified place in unspecified non-institutional (private) residence as the place of occurrence of the external cause: Secondary | ICD-10-CM

## 2011-12-09 DIAGNOSIS — J189 Pneumonia, unspecified organism: Secondary | ICD-10-CM | POA: Diagnosis present

## 2011-12-09 DIAGNOSIS — Z833 Family history of diabetes mellitus: Secondary | ICD-10-CM

## 2011-12-09 DIAGNOSIS — F17200 Nicotine dependence, unspecified, uncomplicated: Secondary | ICD-10-CM

## 2011-12-09 DIAGNOSIS — S42213A Unspecified displaced fracture of surgical neck of unspecified humerus, initial encounter for closed fracture: Secondary | ICD-10-CM | POA: Diagnosis present

## 2011-12-09 DIAGNOSIS — I10 Essential (primary) hypertension: Secondary | ICD-10-CM

## 2011-12-09 DIAGNOSIS — K219 Gastro-esophageal reflux disease without esophagitis: Secondary | ICD-10-CM

## 2011-12-09 DIAGNOSIS — Z79899 Other long term (current) drug therapy: Secondary | ICD-10-CM

## 2011-12-09 DIAGNOSIS — F411 Generalized anxiety disorder: Secondary | ICD-10-CM | POA: Diagnosis present

## 2011-12-09 DIAGNOSIS — S42309A Unspecified fracture of shaft of humerus, unspecified arm, initial encounter for closed fracture: Secondary | ICD-10-CM

## 2011-12-09 DIAGNOSIS — S42209A Unspecified fracture of upper end of unspecified humerus, initial encounter for closed fracture: Secondary | ICD-10-CM

## 2011-12-09 DIAGNOSIS — D126 Benign neoplasm of colon, unspecified: Secondary | ICD-10-CM

## 2011-12-09 DIAGNOSIS — E119 Type 2 diabetes mellitus without complications: Secondary | ICD-10-CM

## 2011-12-09 DIAGNOSIS — J4489 Other specified chronic obstructive pulmonary disease: Secondary | ICD-10-CM

## 2011-12-09 DIAGNOSIS — J449 Chronic obstructive pulmonary disease, unspecified: Secondary | ICD-10-CM

## 2011-12-09 DIAGNOSIS — J984 Other disorders of lung: Secondary | ICD-10-CM

## 2011-12-09 DIAGNOSIS — Z720 Tobacco use: Secondary | ICD-10-CM | POA: Diagnosis present

## 2011-12-09 DIAGNOSIS — J441 Chronic obstructive pulmonary disease with (acute) exacerbation: Principal | ICD-10-CM

## 2011-12-09 DIAGNOSIS — W1809XA Striking against other object with subsequent fall, initial encounter: Secondary | ICD-10-CM | POA: Diagnosis present

## 2011-12-09 DIAGNOSIS — R972 Elevated prostate specific antigen [PSA]: Secondary | ICD-10-CM

## 2011-12-09 DIAGNOSIS — G4700 Insomnia, unspecified: Secondary | ICD-10-CM

## 2011-12-09 MED ORDER — ALBUTEROL SULFATE (5 MG/ML) 0.5% IN NEBU
2.5000 mg | INHALATION_SOLUTION | Freq: Once | RESPIRATORY_TRACT | Status: AC
  Administered 2011-12-09: 2.5 mg via RESPIRATORY_TRACT
  Filled 2011-12-09: qty 0.5

## 2011-12-09 MED ORDER — OXYCODONE-ACETAMINOPHEN 5-325 MG PO TABS
1.0000 | ORAL_TABLET | Freq: Once | ORAL | Status: AC
  Administered 2011-12-09: 1 via ORAL
  Filled 2011-12-09: qty 1

## 2011-12-09 NOTE — ED Provider Notes (Signed)
History     CSN: [id number]  Arrival date & time 12/09/11  2043   First MD Initiated Contact with Patient 12/09/11 2049      Chief Complaint  Patient presents with  . Fall    (Consider location/radiation/quality/duration/timing/severity/associated sxs/prior treatment) HPI Comments: Patient had a mechanical fall 2 days ago, falling and hitting his left upper arm on a railing.  He was able to get up and ambulate back into his home and since then has been on the couch today.  He was unable to get up due to the pain has significant swelling to his upper Left arm with bruising  Patient is a 71 y.o. male presenting with fall. The history is provided by the patient.  Fall The fall occurred while walking. He fell from a height of 3 to 5 ft. He landed on a hard floor. The point of impact was the left shoulder. The pain is at a severity of 7/10. The pain is severe. He was ambulatory at the scene. There was no entrapment after the fall. There was no drug use involved in the accident. There was no alcohol use involved in the accident. Pertinent negatives include no fever and no numbness. The symptoms are aggravated by activity and use of the injured limb.    Past Medical History  Diagnosis Date  . Pulmonary nodule   . COPD (chronic obstructive pulmonary disease)   . Cigarette smoker   . Hypertension   . Hyperlipidemia   . GERD (gastroesophageal reflux disease)   . Diverticulosis of colon   . Hx of colonic polyps   . Elevated prostate specific antigen (PSA)   . Chronic insomnia   . Diabetes mellitus     no per pt, no meds taken    Past Surgical History  Procedure Date  . Vasectomy   . Needle biopsy rul nodule 01/2009    benign  . Tonsillectomy 1947  . Colonoscopy     Family History  Problem Relation Age of Onset  . Lung cancer Brother   . Diabetes Father   . Colon cancer Neg Hx   . Esophageal cancer Neg Hx   . Stomach cancer Neg Hx     History  Substance Use Topics  .  Smoking status: Current Everyday Smoker -- 1.0 packs/day for 55 years    Types: Cigarettes  . Smokeless tobacco: Never Used  . Alcohol Use: 14.4 oz/week    24 Cans of beer per week     6 pack beer daily      Review of Systems  Constitutional: Negative for fever.  Respiratory: Negative for shortness of breath.   Skin: Negative for wound.  Neurological: Negative for dizziness, weakness and numbness.    Allergies  Review of patient's allergies indicates no known allergies.  Home Medications   Current Outpatient Rx  Name Route Sig Dispense Refill  . ATENOLOL 50 MG PO TABS Oral Take 50 mg by mouth daily.    Marland Kitchen FOLIC ACID 1 MG PO TABS Oral Take 1 mg by mouth daily.    Marland Kitchen GEMFIBROZIL 600 MG PO TABS Oral Take 600 mg by mouth 2 (two) times daily before a meal.    . RAMIPRIL 5 MG PO CAPS Oral Take 5 mg by mouth at bedtime.    Marland Kitchen TADALAFIL 20 MG PO TABS Oral Take 20 mg by mouth daily as needed. For ED    . TEMAZEPAM 30 MG PO CAPS Oral Take 30 mg by mouth at  bedtime as needed. For insomnia      BP 157/111  Pulse 123  Temp(Src) 98.1 F (36.7 C) (Oral)  Resp 18  SpO2 94%  Physical Exam  Constitutional: He is oriented to person, place, and time. He appears well-developed and well-nourished.  HENT:  Head: Normocephalic.  Eyes: Pupils are equal, round, and reactive to light.  Neck: Normal range of motion.  Cardiovascular: Tachycardia present.   Pulmonary/Chest: Breath sounds normal.  Musculoskeletal:       Arms:      Full range of motion at elbow, wrist, fingers.  Neurovascularly intact.  Sensation is intact.  Normal color and temperature  Neurological: He is alert and oriented to person, place, and time.  Skin: No rash noted. No erythema.       Anterior shoulder, and upper left arm.  Ecchymotic, swollen    ED Course  Procedures (including critical care time)  Labs Reviewed  CBC - Abnormal; Notable for the following:    MCV 103.1 (*)    MCH 36.7 (*)    Platelets 117 (*)  PLATELET COUNT CONFIRMED BY SMEAR   All other components within normal limits  POCT I-STAT, CHEM 8 - Abnormal; Notable for the following:    Glucose, Bld 156 (*)    All other components within normal limits  BLOOD GAS, ARTERIAL   Dg Chest 2 View  12/10/2011  *RADIOLOGY REPORT*  Clinical Data: Cough  CHEST - 2 VIEW  Comparison: 08/24/2011  Findings: Multiple remote rib fractures bilaterally.  Deformity to the left humerus, in keeping with known fracture.  Bilateral lower lobe subcentimeter nodules presumably represent nipple shadows, and appears similar to prior.  No pleural effusion.  No pneumothorax. Mild aortic arch atherosclerosis.  Otherwise, cardiomediastinal contours within normal limits.  IMPRESSION: No acute cardiopulmonary process.  Comminuted proximal left humeral fracture.  Original Report Authenticated By: Waneta Martins, M.D.   Dg Humerus Left  12/09/2011  *RADIOLOGY REPORT*  Clinical Data: Left humeral pain status post fall.  LEFT HUMERUS - 2+ VIEW  Comparison: None.  Findings: Comminuted fracture of the left humeral neck and proximal shaft.  Humeral head remains seated within the glenoid.  IMPRESSION: Comminuted left humeral neck/proximal shaft fracture.  Original Report Authenticated By: Waneta Martins, M.D.     1. COPD   2. Fracture, humerus, proximal     I spoke with Dr. Langston Masker, who is covering for Dr. Tinnie Gens orthopedics.  He agrees with the plan of shoulder immobilization ice to the area.  Pain control.  She would like to see Mr. Hermelinda Medicus in the office on Tuesday.  Mr. hippert is to call the office on Monday to arrange for a specific time.  He is also to take his arm out of the sling several times a day and move his arm at the elbow and wrist to help with the swelling  MDM  This is most likely a humeral fracture.  Significant ecchymosis decreased range of motion        Arman Filter, NP 12/10/11 0231

## 2011-12-09 NOTE — Progress Notes (Signed)
Orthopedic Tech Progress Note Patient Details:  Blake [name] 1940-10-05 [id number]  Other Ortho Devices Type of Ortho Device: Other (comment) (shoulder immobilizer) Ortho Device Location: (L) UE Ortho Device Interventions: Application   Jennye Moccasin 12/09/2011, 10:45 PM

## 2011-12-09 NOTE — ED Notes (Signed)
Per EMS; pt fell yesterday lost his balance, lives at home, no LOC, no head/neck/back pain; CMS intact per EMS, splint placed; vss en route, EMS noted deformity at L upper arm near shoulder; pt c/o shoulder pain;hx  HTN, hyperlipidemia, COPD

## 2011-12-09 NOTE — ED Notes (Signed)
[facility] paged.

## 2011-12-09 NOTE — ED Notes (Signed)
[facility] called

## 2011-12-10 ENCOUNTER — Encounter (HOSPITAL_COMMUNITY): Payer: Self-pay | Admitting: Internal Medicine

## 2011-12-10 ENCOUNTER — Emergency Department (HOSPITAL_COMMUNITY): Payer: Medicare Other

## 2011-12-10 DIAGNOSIS — J441 Chronic obstructive pulmonary disease with (acute) exacerbation: Secondary | ICD-10-CM | POA: Diagnosis present

## 2011-12-10 LAB — GLUCOSE, CAPILLARY
Glucose-Capillary: 225 mg/dL — ABNORMAL HIGH (ref 70–99)
Glucose-Capillary: 205 mg/dL — ABNORMAL HIGH (ref 70–99)

## 2011-12-10 LAB — POCT I-STAT, CHEM 8
Creatinine, Ser: 0.8 mg/dL (ref 0.50–1.35)
HCT: 49 % (ref 39.0–52.0)
Hemoglobin: 16.7 g/dL (ref 13.0–17.0)
Potassium: 3.9 mEq/L (ref 3.5–5.1)
Sodium: 135 mEq/L (ref 135–145)
TCO2: 19 mmol/L (ref 0–100)

## 2011-12-10 LAB — CBC
Hemoglobin: 16.4 g/dL (ref 13.0–17.0)
Hemoglobin: 15.3 g/dL (ref 13.0–17.0)
MCHC: 35.6 g/dL (ref 30.0–36.0)
MCV: 101.2 fL — ABNORMAL HIGH (ref 78.0–100.0)
Platelets: 117 10*3/uL — ABNORMAL LOW (ref 150–400)
Platelets: 112 10*3/uL — ABNORMAL LOW (ref 150–400)
RBC: 4.16 MIL/uL — ABNORMAL LOW (ref 4.22–5.81)
RDW: 12.6 % (ref 11.5–15.5)
WBC: 6.2 10*3/uL (ref 4.0–10.5)

## 2011-12-10 LAB — POCT I-STAT 3, ART BLOOD GAS (G3+)
pCO2 arterial: 27.3 mmHg — ABNORMAL LOW (ref 35.0–45.0)
pH, Arterial: 7.399 (ref 7.350–7.450)
pO2, Arterial: 74 mmHg — ABNORMAL LOW (ref 80.0–100.0)

## 2011-12-10 LAB — CREATININE, SERUM
Creatinine, Ser: 0.76 mg/dL (ref 0.50–1.35)
GFR calc Af Amer: >90 mL/min (ref >90)
GFR calc non Af Amer: >90 mL/min (ref >90)

## 2011-12-10 LAB — HEMOGLOBIN A1C: Mean Plasma Glucose: 82 mg/dL (ref <117)

## 2011-12-10 MED ORDER — GEMFIBROZIL 600 MG PO TABS
600.0000 mg | ORAL_TABLET | Freq: Two times a day (BID) | ORAL | Status: DC
  Administered 2011-12-10 – 2011-12-14 (×9): 600 mg via ORAL
  Filled 2011-12-10 (×11): qty 1

## 2011-12-10 MED ORDER — METHYLPREDNISOLONE SODIUM SUCC 125 MG IJ SOLR
125.0000 mg | Freq: Four times a day (QID) | INTRAMUSCULAR | Status: DC
  Administered 2011-12-10 – 2011-12-11 (×6): 125 mg via INTRAVENOUS
  Filled 2011-12-10 (×9): qty 2

## 2011-12-10 MED ORDER — ONDANSETRON HCL 4 MG/2ML IJ SOLN: 4.0000 mg | Freq: Four times a day (QID) | INTRAMUSCULAR | Status: DC | PRN

## 2011-12-10 MED ORDER — TEMAZEPAM 15 MG PO CAPS
30.0000 mg | ORAL_CAPSULE | Freq: Every evening | ORAL | Status: DC | PRN
  Administered 2011-12-11 – 2011-12-14 (×3): 30 mg via ORAL
  Filled 2011-12-10 (×3): qty 2

## 2011-12-10 MED ORDER — INSULIN ASPART 100 UNIT/ML ~~LOC~~ SOLN
0.0000 [IU] | Freq: Every day | SUBCUTANEOUS | Status: DC
  Administered 2011-12-10 – 2011-12-11 (×2): 2 [IU] via SUBCUTANEOUS

## 2011-12-10 MED ORDER — IPRATROPIUM BROMIDE 0.02 % IN SOLN
0.5000 mg | Freq: Four times a day (QID) | RESPIRATORY_TRACT | Status: DC
  Administered 2011-12-10 – 2011-12-13 (×12): 0.5 mg via RESPIRATORY_TRACT
  Filled 2011-12-10 (×12): qty 2.5

## 2011-12-10 MED ORDER — ACETAMINOPHEN 325 MG PO TABS
650.0000 mg | ORAL_TABLET | Freq: Four times a day (QID) | ORAL | Status: DC | PRN
  Administered 2011-12-12 (×2): 650 mg via ORAL
  Filled 2011-12-10 (×4): qty 2

## 2011-12-10 MED ORDER — AZITHROMYCIN 250 MG PO TABS
500.0000 mg | ORAL_TABLET | Freq: Once | ORAL | Status: AC
  Administered 2011-12-10: 500 mg via ORAL
  Filled 2011-12-10: qty 2

## 2011-12-10 MED ORDER — ATENOLOL 50 MG PO TABS
50.0000 mg | ORAL_TABLET | Freq: Every day | ORAL | Status: DC
  Administered 2011-12-10 – 2011-12-14 (×5): 50 mg via ORAL
  Filled 2011-12-10 (×5): qty 1

## 2011-12-10 MED ORDER — ATENOLOL 50 MG PO TABS
50.0000 mg | ORAL_TABLET | Freq: Once | ORAL | Status: AC
  Administered 2011-12-10: 50 mg via ORAL
  Filled 2011-12-10 (×2): qty 1

## 2011-12-10 MED ORDER — ENOXAPARIN SODIUM 40 MG/0.4ML ~~LOC~~ SOLN
40.0000 mg | SUBCUTANEOUS | Status: DC
  Administered 2011-12-10 – 2011-12-14 (×5): 40 mg via SUBCUTANEOUS
  Filled 2011-12-10 (×5): qty 0.4

## 2011-12-10 MED ORDER — SODIUM CHLORIDE 0.45 % IV SOLN
INTRAVENOUS | Status: DC
  Administered 2011-12-10: 06:00:00 via INTRAVENOUS

## 2011-12-10 MED ORDER — HYDROMORPHONE HCL PF 1 MG/ML IJ SOLN
1.0000 mg | INTRAMUSCULAR | Status: DC | PRN
  Administered 2011-12-10: 1 mg via INTRAVENOUS
  Filled 2011-12-10 (×2): qty 1

## 2011-12-10 MED ORDER — ACETAMINOPHEN 650 MG RE SUPP: 650.0000 mg | Freq: Four times a day (QID) | RECTAL | Status: DC | PRN

## 2011-12-10 MED ORDER — FOLIC ACID 1 MG PO TABS
1.0000 mg | ORAL_TABLET | Freq: Every day | ORAL | Status: DC
  Administered 2011-12-10 – 2011-12-14 (×5): 1 mg via ORAL
  Filled 2011-12-10 (×5): qty 1

## 2011-12-10 MED ORDER — ONDANSETRON HCL 4 MG PO TABS: 4.0000 mg | ORAL_TABLET | Freq: Four times a day (QID) | ORAL | Status: DC | PRN

## 2011-12-10 MED ORDER — RAMIPRIL 5 MG PO CAPS
5.0000 mg | ORAL_CAPSULE | Freq: Every day | ORAL | Status: DC
  Administered 2011-12-10 – 2011-12-13 (×4): 5 mg via ORAL
  Filled 2011-12-10 (×5): qty 1

## 2011-12-10 MED ORDER — INSULIN ASPART 100 UNIT/ML ~~LOC~~ SOLN
0.0000 [IU] | Freq: Three times a day (TID) | SUBCUTANEOUS | Status: DC
  Administered 2011-12-10 – 2011-12-11 (×4): 2 [IU] via SUBCUTANEOUS
  Administered 2011-12-11: 3 [IU] via SUBCUTANEOUS
  Administered 2011-12-11: 1 [IU] via SUBCUTANEOUS
  Administered 2011-12-12 (×3): 2 [IU] via SUBCUTANEOUS
  Administered 2011-12-13: 1 [IU] via SUBCUTANEOUS
  Administered 2011-12-13: 2 [IU] via SUBCUTANEOUS
  Administered 2011-12-13: 3 [IU] via SUBCUTANEOUS

## 2011-12-10 MED ORDER — OXYCODONE-ACETAMINOPHEN 5-325 MG PO TABS
1.0000 | ORAL_TABLET | Freq: Once | ORAL | Status: AC
  Administered 2011-12-10: 1 via ORAL
  Filled 2011-12-10: qty 1

## 2011-12-10 MED ORDER — GUAIFENESIN ER 600 MG PO TB12
600.0000 mg | ORAL_TABLET | Freq: Two times a day (BID) | ORAL | Status: DC
  Administered 2011-12-10 – 2011-12-14 (×9): 600 mg via ORAL
  Filled 2011-12-10 (×10): qty 1

## 2011-12-10 MED ORDER — PREDNISONE 20 MG PO TABS
60.0000 mg | ORAL_TABLET | Freq: Once | ORAL | Status: AC
  Administered 2011-12-10: 60 mg via ORAL
  Filled 2011-12-10: qty 3

## 2011-12-10 MED ORDER — ALBUTEROL SULFATE (5 MG/ML) 0.5% IN NEBU
2.5000 mg | INHALATION_SOLUTION | Freq: Four times a day (QID) | RESPIRATORY_TRACT | Status: DC
  Administered 2011-12-10 – 2011-12-13 (×12): 2.5 mg via RESPIRATORY_TRACT
  Filled 2011-12-10 (×12): qty 0.5

## 2011-12-10 MED ORDER — ALBUTEROL SULFATE (5 MG/ML) 0.5% IN NEBU: 2.5000 mg | INHALATION_SOLUTION | RESPIRATORY_TRACT | Status: DC | PRN

## 2011-12-10 MED ORDER — MOXIFLOXACIN HCL IN NACL 400 MG/250ML IV SOLN
400.0000 mg | INTRAVENOUS | Status: DC
  Administered 2011-12-10 – 2011-12-11 (×2): 400 mg via INTRAVENOUS
  Filled 2011-12-10 (×3): qty 250

## 2011-12-10 NOTE — ED Notes (Signed)
Respiratory at bedside, oxygen discontinued for room air abg testing. Will continue to monitor pt.

## 2011-12-10 NOTE — ED Notes (Signed)
Attempted to call report to [phone] with staff reports pt room is dirty at this time and unknown timeframe for when ready. Will continue to monitor pt.

## 2011-12-10 NOTE — H&P (Signed)
Blake [name] is an 71 y.o. male.   Chief Complaint: Fall and SOB HPI: 71 yo man who sustained a fall and left comminuted Humeral fracture 2 days ago. He is billed for repair in a few days but has been having SOB and wheeze. Patient has history of COPD and pulmonary nodule. No chest pain, no NVD, no diaphoresis. He is astill a smoker.   Past Medical History  Diagnosis Date  . Pulmonary nodule   . COPD (chronic obstructive pulmonary disease)   . Cigarette smoker   . Hypertension   . Hyperlipidemia   . GERD (gastroesophageal reflux disease)   . Diverticulosis of colon   . Hx of colonic polyps   . Elevated prostate specific antigen (PSA)   . Chronic insomnia   . Diabetes mellitus     no per pt, no meds taken    Past Surgical History  Procedure Date  . Vasectomy   . Needle biopsy rul nodule 01/2009    benign  . Tonsillectomy 1947  . Colonoscopy     Family History  Problem Relation Age of Onset  . Lung cancer Brother   . Diabetes Father   . Colon cancer Neg Hx   . Esophageal cancer Neg Hx   . Stomach cancer Neg Hx    Social History:  reports that he has been smoking Cigarettes.  He has a 55 pack-year smoking history. He has never used smokeless tobacco. He reports that he drinks about 14.4 ounces of alcohol per week. He reports that he does not use illicit drugs.  Allergies: No Known Allergies  Medications Prior to Admission  Medication Dose Route Frequency Provider Last Rate Last Dose  . albuterol (PROVENTIL) (5 MG/ML) 0.5% nebulizer solution 2.5 mg  2.5 mg Nebulization Once Arman Filter, NP   2.5 mg at 12/09/11 2256  . azithromycin (ZITHROMAX) tablet 500 mg  500 mg Oral Once Arman Filter, NP   500 mg at 12/10/11 0246  . oxyCODONE-acetaminophen (PERCOCET) 5-325 MG per tablet 1 tablet  1 tablet Oral Once Arman Filter, NP   1 tablet at 12/09/11 2143  . oxyCODONE-acetaminophen (PERCOCET) 5-325 MG per tablet 1 tablet  1 tablet Oral Once Arman Filter, NP   1 tablet at 12/10/11  0255  . predniSONE (DELTASONE) tablet 60 mg  60 mg Oral Once Arman Filter, NP   60 mg at 12/10/11 0245   Medications Prior to Admission  Medication Sig Dispense Refill  . atenolol (TENORMIN) 50 MG tablet Take 50 mg by mouth daily.      . folic acid (FOLVITE) 1 MG tablet Take 1 mg by mouth daily.      Marland Kitchen gemfibrozil (LOPID) 600 MG tablet Take 600 mg by mouth 2 (two) times daily before a meal.      . ramipril (ALTACE) 5 MG capsule Take 5 mg by mouth at bedtime.      . tadalafil (CIALIS) 20 MG tablet Take 20 mg by mouth daily as needed. For ED      . temazepam (RESTORIL) 30 MG capsule Take 30 mg by mouth at bedtime as needed. For insomnia        Results for orders placed during the hospital encounter of 12/09/11 (from the past 48 hour(s))  CBC     Status: Abnormal   Collection Time   12/10/11 12:01 AM      Component Value Range Comment   WBC 7.0  4.0 - 10.5 (K/uL)  RBC 4.47  4.22 - 5.81 (MIL/uL)    Hemoglobin 16.4  13.0 - 17.0 (g/dL)    HCT 45.4  09.8 - 11.9 (%)    MCV 103.1 (*) 78.0 - 100.0 (fL)    MCH 36.7 (*) 26.0 - 34.0 (pg)    MCHC 35.6  30.0 - 36.0 (g/dL)    RDW 14.7  82.9 - 56.2 (%)    Platelets 117 (*) 150 - 400 (K/uL) PLATELET COUNT CONFIRMED BY SMEAR  POCT I-STAT, CHEM 8     Status: Abnormal   Collection Time   12/10/11 12:10 AM      Component Value Range Comment   Sodium 135  135 - 145 (mEq/L)    Potassium 3.9  3.5 - 5.1 (mEq/L)    Chloride 104  96 - 112 (mEq/L)    BUN 15  6 - 23 (mg/dL)    Creatinine, Ser 1.30  0.50 - 1.35 (mg/dL)    Glucose, Bld 865 (*) 70 - 99 (mg/dL)    Calcium, Ion 7.84  1.12 - 1.32 (mmol/L)    TCO2 19  0 - 100 (mmol/L)    Hemoglobin 16.7  13.0 - 17.0 (g/dL)    HCT 69.6  29.5 - 28.4 (%)   POCT I-STAT 3, BLOOD GAS (G3+)     Status: Abnormal   Collection Time   12/10/11  4:02 AM      Component Value Range Comment   pH, Arterial 7.399  7.350 - 7.450     pCO2 arterial 27.3 (*) 35.0 - 45.0 (mmHg)    pO2, Arterial 74.0 (*) 80.0 - 100.0 (mmHg)     Bicarbonate 16.9 (*) 20.0 - 24.0 (mEq/L)    TCO2 18  0 - 100 (mmol/L)    O2 Saturation 95.0      Acid-base deficit 6.0 (*) 0.0 - 2.0 (mmol/L)    Patient temperature 98.6 F      Collection site RADIAL, ALLEN'S TEST ACCEPTABLE      Drawn by Operator      Sample type ARTERIAL      Dg Chest 2 View  12/10/2011  *RADIOLOGY REPORT*  Clinical Data: Cough  CHEST - 2 VIEW  Comparison: 08/24/2011  Findings: Multiple remote rib fractures bilaterally.  Deformity to the left humerus, in keeping with known fracture.  Bilateral lower lobe subcentimeter nodules presumably represent nipple shadows, and appears similar to prior.  No pleural effusion.  No pneumothorax. Mild aortic arch atherosclerosis.  Otherwise, cardiomediastinal contours within normal limits.  IMPRESSION: No acute cardiopulmonary process.  Comminuted proximal left humeral fracture.  Original Report Authenticated By: Waneta Martins, M.D.   Dg Humerus Left  12/09/2011  *RADIOLOGY REPORT*  Clinical Data: Left humeral pain status post fall.  LEFT HUMERUS - 2+ VIEW  Comparison: None.  Findings: Comminuted fracture of the left humeral neck and proximal shaft.  Humeral head remains seated within the glenoid.  IMPRESSION: Comminuted left humeral neck/proximal shaft fracture.  Original Report Authenticated By: Waneta Martins, M.D.    Review of Systems  Constitutional: Positive for malaise/fatigue.  Respiratory: Positive for hemoptysis, sputum production, shortness of breath and wheezing.   Cardiovascular: Positive for palpitations.  All other systems reviewed and are negative.    Blood pressure 164/106, pulse 115, temperature 97.2 F (36.2 C), temperature source Oral, resp. rate 24, SpO2 93.00%. Physical Exam  Constitutional: He is oriented to person, place, and time. He appears well-developed and well-nourished.  HENT:  Head: Normocephalic and atraumatic.  Right Ear:  External ear normal.  Left Ear: External ear normal.  Nose: Nose  normal.  Mouth/Throat: Oropharynx is clear and moist.  Eyes: Conjunctivae and EOM are normal. Pupils are equal, round, and reactive to light.  Neck: Normal range of motion. Neck supple.  Cardiovascular: Normal rate, regular rhythm, normal heart sounds and intact distal pulses.   Respiratory: He is in respiratory distress. He has wheezes. He has no rales. He exhibits no tenderness.  GI: Soft. Bowel sounds are normal.  Musculoskeletal:       Left shoulder: He exhibits decreased range of motion, tenderness, swelling and deformity.  Neurological: He is alert and oriented to person, place, and time. He has normal reflexes.  Skin: Skin is warm and dry.  Psychiatric: He has a normal mood and affect. His behavior is normal. Judgment and thought content normal.     Assessment/Plan 1. COPD exacerbation: Admit, nebs, steroids and antibiotics. Get patient ready for surgery. 2. Left humeral fracture: Ortho aware and will follow. In a sling. Once stable, will have surgery. 3. Tobacco abuse: counseled. Decline nicotine patch 4. DM2: SSI  Dezra Mandella,LAWAL 12/10/2011, 4:58 AM

## 2011-12-10 NOTE — ED Notes (Signed)
Spoke with Dr. Mikeal Hawthorne concerning pt bed assignment states with verbal report that pt is a telemetry pt and pt can be transported to floor off telemetry.

## 2011-12-10 NOTE — ED Provider Notes (Signed)
Medical screening examination/treatment/procedure(s) were conducted as a shared visit with non-physician practitioner(s) and myself.  I personally evaluated the patient during the encounter  Complains of some sob and continued L arm pain after fall striking L upper arm on rail.  No parasthesis but has had significant amount swelling develop.  No blood thinning meds.  No additional injury.  No cp  Pain on palpation L upper arm with obvious swelling and defomirty.  Axillary nerve intact.  NV intact distally.  Wheezing on exam  Will attempt nebs, imaging, discuss with ortho, pain control - admit if unable to control pain or copd  Dayton Bailiff, MD 12/10/11 1006

## 2011-12-11 LAB — CBC
Hemoglobin: 15.2 g/dL (ref 13.0–17.0)
MCV: 100.7 fL — ABNORMAL HIGH (ref 78.0–100.0)
Platelets: 119 10*3/uL — ABNORMAL LOW (ref 150–400)
RBC: 4.1 MIL/uL — ABNORMAL LOW (ref 4.22–5.81)
WBC: 9.2 10*3/uL (ref 4.0–10.5)

## 2011-12-11 LAB — COMPREHENSIVE METABOLIC PANEL
Albumin: 3.1 g/dL — ABNORMAL LOW (ref 3.5–5.2)
BUN: 18 mg/dL (ref 6–23)
Calcium: 10 mg/dL (ref 8.4–10.5)
Chloride: 97 mEq/L (ref 96–112)
Creatinine, Ser: 0.68 mg/dL (ref 0.50–1.35)
Total Bilirubin: 0.8 mg/dL (ref 0.3–1.2)
Total Protein: 6.2 g/dL (ref 6.0–8.3)

## 2011-12-11 LAB — GLUCOSE, CAPILLARY: Glucose-Capillary: 241 mg/dL — ABNORMAL HIGH (ref 70–99)

## 2011-12-11 MED ORDER — MOXIFLOXACIN HCL 400 MG PO TABS
400.0000 mg | ORAL_TABLET | Freq: Every day | ORAL | Status: DC
  Administered 2011-12-11 – 2011-12-13 (×3): 400 mg via ORAL
  Filled 2011-12-11 (×4): qty 1

## 2011-12-11 MED ORDER — PREDNISONE 50 MG PO TABS
60.0000 mg | ORAL_TABLET | Freq: Every day | ORAL | Status: DC
  Administered 2011-12-11 – 2011-12-13 (×3): 60 mg via ORAL
  Filled 2011-12-11 (×4): qty 1

## 2011-12-11 NOTE — Consult Note (Signed)
Reason for Consult left humerus fx Referring Physician: DR.Golding  Blake [name] is an 71 y.o. male.  HPI: fell 2 days ago and sustained left humerus fx. hosp due to COPD DR NOrris called and asked to see in office Tuesday. Dr Phillips Odor feels unable to go home and asked for Ortho consult. Pt only c/co left arm pain. Feeels he will be able to tend to himself at home.  Past Medical History  Diagnosis Date  . Pulmonary nodule   . COPD (chronic obstructive pulmonary disease)   . Cigarette smoker   . Hypertension   . Hyperlipidemia   . GERD (gastroesophageal reflux disease)   . Diverticulosis of colon   . Hx of colonic polyps   . Elevated prostate specific antigen (PSA)   . Chronic insomnia   . Diabetes mellitus     no per pt, no meds taken    Past Surgical History  Procedure Date  . Vasectomy   . Needle biopsy rul nodule 01/2009    benign  . Tonsillectomy 1947  . Colonoscopy     Family History  Problem Relation Age of Onset  . Lung cancer Brother   . Diabetes Father   . Colon cancer Neg Hx   . Esophageal cancer Neg Hx   . Stomach cancer Neg Hx     Social History:  reports that he has been smoking Cigarettes.  He has a 55 pack-year smoking history. He has never used smokeless tobacco. He reports that he drinks about 14.4 ounces of alcohol per week. He reports that he does not use illicit drugs.  Allergies: No Known Allergies  Medications: I have reviewed the patient's current medications.  Results for orders placed during the hospital encounter of 12/09/11 (from the past 48 hour(s))  CBC     Status: Abnormal   Collection Time   12/10/11 12:01 AM      Component Value Range Comment   WBC 7.0  4.0 - 10.5 (K/uL)    RBC 4.47  4.22 - 5.81 (MIL/uL)    Hemoglobin 16.4  13.0 - 17.0 (g/dL)    HCT 16.1  09.6 - 04.5 (%)    MCV 103.1 (*) 78.0 - 100.0 (fL)    MCH 36.7 (*) 26.0 - 34.0 (pg)    MCHC 35.6  30.0 - 36.0 (g/dL)    RDW 40.9  81.1 - 91.4 (%)    Platelets 117 (*) 150 - 400  (K/uL) PLATELET COUNT CONFIRMED BY SMEAR  POCT I-STAT, CHEM 8     Status: Abnormal   Collection Time   12/10/11 12:10 AM      Component Value Range Comment   Sodium 135  135 - 145 (mEq/L)    Potassium 3.9  3.5 - 5.1 (mEq/L)    Chloride 104  96 - 112 (mEq/L)    BUN 15  6 - 23 (mg/dL)    Creatinine, Ser 7.82  0.50 - 1.35 (mg/dL)    Glucose, Bld 956 (*) 70 - 99 (mg/dL)    Calcium, Ion 2.13  1.12 - 1.32 (mmol/L)    TCO2 19  0 - 100 (mmol/L)    Hemoglobin 16.7  13.0 - 17.0 (g/dL)    HCT 08.6  57.8 - 46.9 (%)   POCT I-STAT 3, BLOOD GAS (G3+)     Status: Abnormal   Collection Time   12/10/11  4:02 AM      Component Value Range Comment   pH, Arterial 7.399  7.350 - 7.450  pCO2 arterial 27.3 (*) 35.0 - 45.0 (mmHg)    pO2, Arterial 74.0 (*) 80.0 - 100.0 (mmHg)    Bicarbonate 16.9 (*) 20.0 - 24.0 (mEq/L)    TCO2 18  0 - 100 (mmol/L)    O2 Saturation 95.0      Acid-base deficit 6.0 (*) 0.0 - 2.0 (mmol/L)    Patient temperature 98.6 F      Collection site RADIAL, ALLEN'S TEST ACCEPTABLE      Drawn by Operator      Sample type ARTERIAL     CREATININE, SERUM     Status: Normal   Collection Time   12/10/11  5:01 AM      Component Value Range Comment   Creatinine, Ser 0.76  0.50 - 1.35 (mg/dL)    GFR calc non Af Amer >90  >90 (mL/min)    GFR calc Af Amer >90  >90 (mL/min)   HEMOGLOBIN A1C     Status: Normal   Collection Time   12/10/11  5:01 AM      Component Value Range Comment   Hemoglobin A1C 4.6  <5.7 (%)    Mean Plasma Glucose 85  <117 (mg/dL)   GLUCOSE, CAPILLARY     Status: Abnormal   Collection Time   12/10/11  5:40 AM      Component Value Range Comment   Glucose-Capillary 225 (*) 70 - 99 (mg/dL)    Comment 1 Notify RN     CBC     Status: Abnormal   Collection Time   12/10/11  7:00 AM      Component Value Range Comment   WBC 6.2  4.0 - 10.5 (K/uL)    RBC 4.16 (*) 4.22 - 5.81 (MIL/uL)    Hemoglobin 15.3  13.0 - 17.0 (g/dL)    HCT 98.1  19.1 - 47.8 (%)    MCV 101.2 (*) 78.0  - 100.0 (fL)    MCH 36.8 (*) 26.0 - 34.0 (pg)    MCHC 36.3 (*) 30.0 - 36.0 (g/dL)    RDW 29.5  62.1 - 30.8 (%)    Platelets 112 (*) 150 - 400 (K/uL) CONSISTENT WITH PREVIOUS RESULT  HEMOGLOBIN A1C     Status: Normal   Collection Time   12/10/11  7:00 AM      Component Value Range Comment   Hemoglobin A1C 4.5  <5.7 (%)    Mean Plasma Glucose 82  <117 (mg/dL)   GLUCOSE, CAPILLARY     Status: Abnormal   Collection Time   12/10/11  7:38 AM      Component Value Range Comment   Glucose-Capillary 185 (*) 70 - 99 (mg/dL)    Comment 1 Notify RN     GLUCOSE, CAPILLARY     Status: Abnormal   Collection Time   12/10/11 11:36 AM      Component Value Range Comment   Glucose-Capillary 197 (*) 70 - 99 (mg/dL)    Comment 1 Notify RN     GLUCOSE, CAPILLARY     Status: Abnormal   Collection Time   12/10/11  4:47 PM      Component Value Range Comment   Glucose-Capillary 197 (*) 70 - 99 (mg/dL)    Comment 1 Notify RN     GLUCOSE, CAPILLARY     Status: Abnormal   Collection Time   12/10/11  9:03 PM      Component Value Range Comment   Glucose-Capillary 205 (*) 70 - 99 (mg/dL)   COMPREHENSIVE METABOLIC  PANEL     Status: Abnormal   Collection Time   12/10/11 11:39 PM      Component Value Range Comment   Sodium 129 (*) 135 - 145 (mEq/L)    Potassium 4.1  3.5 - 5.1 (mEq/L)    Chloride 97  96 - 112 (mEq/L)    CO2 19  19 - 32 (mEq/L)    Glucose, Bld 176 (*) 70 - 99 (mg/dL)    BUN 18  6 - 23 (mg/dL)    Creatinine, Ser 1.61  0.50 - 1.35 (mg/dL)    Calcium 09.6  8.4 - 10.5 (mg/dL)    Total Protein 6.2  6.0 - 8.3 (g/dL)    Albumin 3.1 (*) 3.5 - 5.2 (g/dL)    AST 40 (*) 0 - 37 (U/L)    ALT 50  0 - 53 (U/L)    Alkaline Phosphatase 67  39 - 117 (U/L)    Total Bilirubin 0.8  0.3 - 1.2 (mg/dL)    GFR calc non Af Amer >90  >90 (mL/min)    GFR calc Af Amer >90  >90 (mL/min)   CBC     Status: Abnormal   Collection Time   12/10/11 11:39 PM      Component Value Range Comment   WBC 9.2  4.0 - 10.5 (K/uL)     RBC 4.10 (*) 4.22 - 5.81 (MIL/uL)    Hemoglobin 15.2  13.0 - 17.0 (g/dL)    HCT 04.5  40.9 - 81.1 (%)    MCV 100.7 (*) 78.0 - 100.0 (fL)    MCH 37.1 (*) 26.0 - 34.0 (pg)    MCHC 36.8 (*) 30.0 - 36.0 (g/dL)    RDW 91.4  78.2 - 95.6 (%)    Platelets 119 (*) 150 - 400 (K/uL) CONSISTENT WITH PREVIOUS RESULT  GLUCOSE, CAPILLARY     Status: Abnormal   Collection Time   12/11/11  7:29 AM      Component Value Range Comment   Glucose-Capillary 179 (*) 70 - 99 (mg/dL)    Comment 1 Notify RN     GLUCOSE, CAPILLARY     Status: Abnormal   Collection Time   12/11/11 11:34 AM      Component Value Range Comment   Glucose-Capillary 241 (*) 70 - 99 (mg/dL)    Comment 1 Notify RN       Dg Chest 2 View  12/10/2011  *RADIOLOGY REPORT*  Clinical Data: Cough  CHEST - 2 VIEW  Comparison: 08/24/2011  Findings: Multiple remote rib fractures bilaterally.  Deformity to the left humerus, in keeping with known fracture.  Bilateral lower lobe subcentimeter nodules presumably represent nipple shadows, and appears similar to prior.  No pleural effusion.  No pneumothorax. Mild aortic arch atherosclerosis.  Otherwise, cardiomediastinal contours within normal limits.  IMPRESSION: No acute cardiopulmonary process.  Comminuted proximal left humeral fracture.  Original Report Authenticated By: Waneta Martins, M.D.   Dg Humerus Left  12/09/2011  *RADIOLOGY REPORT*  Clinical Data: Left humeral pain status post fall.  LEFT HUMERUS - 2+ VIEW  Comparison: None.  Findings: Comminuted fracture of the left humeral neck and proximal shaft.  Humeral head remains seated within the glenoid.  IMPRESSION: Comminuted left humeral neck/proximal shaft fracture.  Original Report Authenticated By: Waneta Martins, M.D.    ROS Blood pressure 134/95, pulse 82, temperature 97.6 F (36.4 C), temperature source Oral, resp. rate 20, height 5' 10.5" (1.791 m), weight 70.035 kg (154 lb 6.4 oz), SpO2  98.00%. Physical Examawake and alert very  pleasant. Oriented by 4. Left shoulder swollen and tender. Elbow distal OK NV grossly intact  Assessment/Plan: Comminuted displaced left prox hum fx.    Socially unable to care for at home. Dr Ranell Patrick aware of patient.  Continue sh immob and definitive plans Monday per Dr. Ranell Patrick   . Discussed with patient and Dr. Watt Climes ANDREW 12/11/2011, 11:51 AM

## 2011-12-11 NOTE — Progress Notes (Addendum)
Subjective: Pain left shoulder, breathing better  Objective: Vital signs in last 24 hours: Temp:  [97.4 F (36.3 C)-98.3 F (36.8 C)] 97.6 F (36.4 C) (03/16 0600) Pulse Rate:  [64-70] 70  (03/16 0600) Resp:  [18-20] 20  (03/16 0600) BP: (138-168)/(90-99) 155/99 mmHg (03/16 0600) SpO2:  [94 %-100 %] 98 % (03/16 0600) Weight change:  Last BM Date: 12/09/11  Intake/Output from previous day: 03/15 0701 - 03/16 0700 In: 480 [P.O.:480] Out: -      Physical Exam: General: Alert, awake, oriented x3, in no acute distress. HEENT: No bruits, no goiter. Heart: Regular rate and rhythm, without murmurs, rubs, gallops. Lungs: scattered wheezing Abdomen: Soft, nontender, nondistended, positive bowel sounds. Extremities: left should immobilized Neuro: Grossly intact, nonfocal.    Lab Results: Basic Metabolic Panel:  Basename 12/10/11 2339 12/10/11 0501 12/10/11 0010  NA 129* -- 135  K 4.1 -- 3.9  CL 97 -- 104  CO2 19 -- --  GLUCOSE 176* -- 156*  BUN 18 -- 15  CREATININE 0.68 0.76 --  CALCIUM 10.0 -- --  MG -- -- --  PHOS -- -- --   Liver Function Tests:  [facility] 12/10/11 2339  AST 40*  ALT 50  ALKPHOS 67  BILITOT 0.8  PROT 6.2  ALBUMIN 3.1*   CBC:  Basename 12/10/11 2339 12/10/11 0700  WBC 9.2 6.2  NEUTROABS -- --  HGB 15.2 15.3  HCT 41.3 42.1  MCV 100.7* 101.2*  PLT 119* 112*   CBG:  Basename 12/11/11 0729 12/10/11 2103 12/10/11 1647 12/10/11 1136 12/10/11 0738 12/10/11 0540  GLUCAP 179* 205* 197* 197* 185* 225*   Hemoglobin A1C:  Basename 12/10/11 0700  HGBA1C 4.5   Studies/Results: Dg Chest 2 View  12/10/2011  *RADIOLOGY REPORT*  Clinical Data: Cough  CHEST - 2 VIEW  Comparison: 08/24/2011  Findings: Multiple remote rib fractures bilaterally.  Deformity to the left humerus, in keeping with known fracture.  Bilateral lower lobe subcentimeter nodules presumably represent nipple shadows, and appears similar to prior.  No pleural effusion.  No  pneumothorax. Mild aortic arch atherosclerosis.  Otherwise, cardiomediastinal contours within normal limits.  IMPRESSION: No acute cardiopulmonary process.  Comminuted proximal left humeral fracture.  Original Report Authenticated By: Waneta Martins, M.D.   Dg Humerus Left  12/09/2011  *RADIOLOGY REPORT*  Clinical Data: Left humeral pain status post fall.  LEFT HUMERUS - 2+ VIEW  Comparison: None.  Findings: Comminuted fracture of the left humeral neck and proximal shaft.  Humeral head remains seated within the glenoid.  IMPRESSION: Comminuted left humeral neck/proximal shaft fracture.  Original Report Authenticated By: Waneta Martins, M.D.    Medications: Scheduled Meds:   . albuterol  2.5 mg Nebulization Q6H  . atenolol  50 mg Oral Daily  . enoxaparin  40 mg Subcutaneous Q24H  . folic acid  1 mg Oral Daily  . gemfibrozil  600 mg Oral BID AC  . guaiFENesin  600 mg Oral BID  . insulin aspart  0-5 Units Subcutaneous QHS  . insulin aspart  0-9 Units Subcutaneous TID WC  . ipratropium  0.5 mg Nebulization Q6H  . methylPREDNISolone (SOLU-MEDROL) injection  125 mg Intravenous Q6H  . moxifloxacin  400 mg Intravenous Q24H  . ramipril  5 mg Oral QHS   Continuous Infusions:   . sodium chloride 100 mL/hr at 12/10/11 0611   PRN Meds:.acetaminophen, acetaminophen, albuterol, HYDROmorphone, ondansetron (ZOFRAN) IV, ondansetron, temazepam  Assessment/Plan:  Principal Problem:  *Humeral fracture Active Problems:  DM  CIGARETTE SMOKER  GERD  COPD exacerbation  Plan is to immobilize shoulder, work-up as an outpatient. Treat COPD exacerbation for this admission. D/w ortho.   LOS: 2 days   [facility] Pager: [phone] 12/11/2011, 8:36 AM

## 2011-12-11 NOTE — Progress Notes (Addendum)
Assessment/Plan: 71 yo gentleman admitted on 3/16 with comminuted shoulder injury sustained from a fall at home. Also having a mild COPD exacerbation. Plan in ED was for him to be admitted and that once his COPD was better he would be evaluated by ortho for shoulder surgery.   Patient's breathing is improved, still with scattered wheezes. Patient is upset that ortho has not evaluated him- I explained that we needed to get his lungs better before surgery, but patient insists his breathing is fine and that he came in for his shoulder to "fixed". He lives alone and is refusing outpatient work-up, no transportation or help at home. Best option may be for him to get surgical repair here is even indicated by ortho and then we can discharge him to rehab [facility] until he regains his dominant arm/hand function.    Plan: 1. Change to oral prednisone 2. Change to oral antibiotics 3. Continue nebs and transition to inhaler regimen 4. I contacted orthopedic surgery regarding further evaluation and obtaining a formal inpatient consult.   Anderson Malta, DO Internal Medicine [phone]

## 2011-12-12 LAB — GLUCOSE, CAPILLARY
Glucose-Capillary: 185 mg/dL — ABNORMAL HIGH (ref 70–99)
Glucose-Capillary: 185 mg/dL — ABNORMAL HIGH (ref 70–99)

## 2011-12-12 NOTE — Progress Notes (Signed)
Subjective: Breathing better, reports not having seen ortho "I think my memory is getting bad" - upset that I again discussed logistics of an outpatient work-up with him-he says he cant take care of himself at home  Objective: Vital signs in last 24 hours: Temp:  [98.3 F (36.8 C)-98.4 F (36.9 C)] 98.4 F (36.9 C) (03/17 1400) Pulse Rate:  [56-61] 56  (03/17 1400) Resp:  [18-20] 20  (03/17 1400) BP: (140-144)/(71-73) 140/73 mmHg (03/17 1400) SpO2:  [91 %-97 %] 95 % (03/17 1438) Weight:  [71.8 kg (158 lb 4.6 oz)] 71.8 kg (158 lb 4.6 oz) (03/17 0530) Weight change:  Last BM Date: 12/09/11  Intake/Output from previous day: 03/16 0701 - 03/17 0700 In: 1080 [P.O.:1080] Out: -     Physical Exam: General: Alert, awake, oriented x3, in no acute distress. HEENT: No bruits, no goiter. Heart: Regular rate and rhythm, without murmurs, rubs, gallops. Lungs: scattered wheezing Abdomen: Soft, nontender, nondistended, positive bowel sounds. Extremities: left arm in sling Neuro: Grossly intact, nonfocal.  Lab Results: Basic Metabolic Panel:  Basename 12/10/11 2339 12/10/11 0501 12/10/11 0010  NA 129* -- 135  K 4.1 -- 3.9  CL 97 -- 104  CO2 19 -- --  GLUCOSE 176* -- 156*  BUN 18 -- 15  CREATININE 0.68 0.76 --  CALCIUM 10.0 -- --  MG -- -- --  PHOS -- -- --   Liver Function Tests:  [facility] 12/10/11 2339  AST 40*  ALT 50  ALKPHOS 67  BILITOT 0.8  PROT 6.2  ALBUMIN 3.1*   CBC:  Basename 12/10/11 2339 12/10/11 0700  WBC 9.2 6.2  NEUTROABS -- --  HGB 15.2 15.3  HCT 41.3 42.1  MCV 100.7* 101.2*  PLT 119* 112*   CBG:  Basename 12/12/11 1212 12/12/11 0735 12/11/11 2126 12/11/11 1648 12/11/11 1134 12/11/11 0729  GLUCAP 185* 181* 212* 136* 241* 179*   Hemoglobin A1C:  Basename 12/10/11 0700  HGBA1C 4.5   Medications: Scheduled Meds:   . albuterol  2.5 mg Nebulization Q6H  . atenolol  50 mg Oral Daily  . enoxaparin  40 mg Subcutaneous Q24H  . folic acid  1 mg  Oral Daily  . gemfibrozil  600 mg Oral BID AC  . guaiFENesin  600 mg Oral BID  . insulin aspart  0-5 Units Subcutaneous QHS  . insulin aspart  0-9 Units Subcutaneous TID WC  . ipratropium  0.5 mg Nebulization Q6H  . moxifloxacin  400 mg Oral q1800  . predniSONE  60 mg Oral Q breakfast  . ramipril  5 mg Oral QHS   Continuous Infusions:  PRN Meds:.acetaminophen, acetaminophen, albuterol, HYDROmorphone, ondansetron (ZOFRAN) IV, ondansetron, temazepam  Assessment/Plan:   71 yo gentleman admitted on 3/16 with comminuted shoulder injury sustained from a fall at home. Also having a mild COPD exacerbation. Plan in ED was for him to be admitted and that once his COPD was better he would be evaluated by ortho for shoulder surgery.   Patient's breathing is improved, still with scattered wheezes. Patient does not remeber ortho eval yesterday -I have again explained that we needed to get his lungs better before surgery, but patient insists his breathing is fine and that he came in for his shoulder to "fixed". He lives alone and is refusing outpatient work-up, no transportation or help at home. Best option may be for him to get surgical repair here if this is even indicated by ortho and then we can discharge him to rehab [facility]  term until he regains his dominant arm/hand function.   Plan:  1. Lungs improved, steroid taper 2. Pain well controlled 3. Will get PT OT eval for placement recs 4. Await ortho surgery recs on Monday.  Anderson Malta, DO  Internal Medicine  [phone]    LOS: 3 days   [facility] Pager: [phone] 12/12/2011, 5:23 PM     Assessment/Plan: 71 yo gentleman admitted on 3/16 with comminuted shoulder injury sustained from a fall at home. Also having a mild COPD exacerbation. Plan in ED was for him to be admitted and that once his COPD was better he would be evaluated by ortho for shoulder surgery.   Patient's breathing is improved, still with  scattered wheezes. Patient is upset that ortho has not evaluated him- I explained that we needed to get his lungs better before surgery, but patient insists his breathing is fine and that he came in for his shoulder to "fixed". He lives alone and is refusing outpatient work-up, no transportation or help at home. Best option may be for him to get surgical repair here is even indicated by ortho and then we can discharge him to rehab [facility] term until he regains his dominant arm/hand function.    Plan: 1. Change to oral prednisone 2. Change to oral antibiotics 3. Continue nebs and transition to inhaler regimen 4. I contacted orthopedic surgery regarding further evaluation and obtaining a formal inpatient consult.   Anderson Malta, DO Internal Medicine [phone]

## 2011-12-13 LAB — BASIC METABOLIC PANEL
BUN: 16 mg/dL (ref 6–23)
Calcium: 9.7 mg/dL (ref 8.4–10.5)
GFR calc non Af Amer: >90 mL/min (ref >90)
Glucose, Bld: 172 mg/dL — ABNORMAL HIGH (ref 70–99)

## 2011-12-13 LAB — CBC
Hemoglobin: 13.4 g/dL (ref 13.0–17.0)
MCH: 35.6 pg — ABNORMAL HIGH (ref 26.0–34.0)
MCHC: 35.4 g/dL (ref 30.0–36.0)
MCV: 100.8 fL — ABNORMAL HIGH (ref 78.0–100.0)
Platelets: 154 10*3/uL (ref 150–400)

## 2011-12-13 LAB — GLUCOSE, CAPILLARY: Glucose-Capillary: 181 mg/dL — ABNORMAL HIGH (ref 70–99)

## 2011-12-13 MED ORDER — FLUTICASONE-SALMETEROL 250-50 MCG/DOSE IN AEPB
1.0000 | INHALATION_SPRAY | Freq: Two times a day (BID) | RESPIRATORY_TRACT | Status: DC
  Administered 2011-12-13 – 2011-12-14 (×3): 1 via RESPIRATORY_TRACT
  Filled 2011-12-13: qty 14

## 2011-12-13 MED ORDER — TIOTROPIUM BROMIDE MONOHYDRATE 18 MCG IN CAPS
18.0000 ug | ORAL_CAPSULE | Freq: Every day | RESPIRATORY_TRACT | Status: DC
Start: 2011-12-13 — End: 2011-12-14
  Administered 2011-12-13 – 2011-12-14 (×2): 18 ug via RESPIRATORY_TRACT
  Filled 2011-12-13: qty 5

## 2011-12-13 MED ORDER — ALBUTEROL SULFATE (5 MG/ML) 0.5% IN NEBU: 2.5000 mg | INHALATION_SOLUTION | RESPIRATORY_TRACT | Status: DC | PRN

## 2011-12-13 NOTE — Consult Note (Signed)
Thank you for consult on [name]. Chart reviewed and note that patient with Left shoulder fracture (dominant side) and concerns about ability to care for self at home.  Doubt that [facility] CIR stay will get him at independent level.  Will await formal PT/OT evaluations prior to a formal consult.

## 2011-12-13 NOTE — Progress Notes (Signed)
Subjective: Breathing is better. Feeling better.  Objective: Vital signs in last 24 hours: Temp:  [98 F (36.7 C)-98.4 F (36.9 C)] 98 F (36.7 C) (03/18 0600) Pulse Rate:  [52-62] 62  (03/18 0600) Resp:  [16-20] 18  (03/18 0600) BP: (129-140)/(73-76) 138/76 mmHg (03/18 0600) SpO2:  [95 %-100 %] 96 % (03/18 0916) Weight:  [73.6 kg (162 lb 4.1 oz)] 73.6 kg (162 lb 4.1 oz) (03/18 0600) Weight change: 1.8 kg (3 lb 15.5 oz) Last BM Date: 12/12/11  Intake/Output from previous day:   Total I/O In: 240 [P.O.:240] Out: 600 [Urine:600]  Physical Exam: General: Alert, awake, oriented x3, in no acute distress. HEENT: No bruits, no goiter. Heart: Regular rate and rhythm, without murmurs, rubs, gallops. Lungs: scattered wheezing Abdomen: Soft, nontender, nondistended, positive bowel sounds. Extremities: left arm in sling Neuro: Grossly intact, nonfocal.  Lab Results: Basic Metabolic Panel:  Basename 12/10/11 2339  NA 129*  K 4.1  CL 97  CO2 19  GLUCOSE 176*  BUN 18  CREATININE 0.68  CALCIUM 10.0  MG --  PHOS --   Liver Function Tests:  [facility] 12/10/11 2339  AST 40*  ALT 50  ALKPHOS 67  BILITOT 0.8  PROT 6.2  ALBUMIN 3.1*   CBC:  Basename 12/10/11 2339  WBC 9.2  NEUTROABS --  HGB 15.2  HCT 41.3  MCV 100.7*  PLT 119*   CBG:  Basename 12/13/11 0722 12/12/11 2203 12/12/11 1639 12/12/11 1212 12/12/11 0735 12/11/11 2126  GLUCAP 145* 185* 182* 185* 181* 212*   Medications: Scheduled Meds:    . albuterol  2.5 mg Nebulization Q6H  . atenolol  50 mg Oral Daily  . enoxaparin  40 mg Subcutaneous Q24H  . folic acid  1 mg Oral Daily  . gemfibrozil  600 mg Oral BID AC  . guaiFENesin  600 mg Oral BID  . insulin aspart  0-5 Units Subcutaneous QHS  . insulin aspart  0-9 Units Subcutaneous TID WC  . ipratropium  0.5 mg Nebulization Q6H  . moxifloxacin  400 mg Oral q1800  . predniSONE  60 mg Oral Q breakfast  . ramipril  5 mg Oral QHS   Continuous Infusions:    PRN Meds:.acetaminophen, acetaminophen, albuterol, HYDROmorphone, ondansetron (ZOFRAN) IV, ondansetron, temazepam  Assessment/Plan:   71 yo gentleman admitted on 3/16 with comminuted shoulder injury sustained from a fall at home. Also having a mild COPD exacerbation. Plan in ED was for him to be admitted and that once his COPD was better he would be evaluated by ortho for shoulder surgery.   Patient's breathing is improved, still with scattered wheezes.Ortho evaluated this AM, not a surgery candidate, will let shoulder heal on its own, needs frequent re-evaluation. Patient lives alone and says he cannot care for himself and is asking for inpatient rehab or rehab facility, no transportation or help at home. Will have PT/OT eval and make rec for rehab/CIR.  Plan:  1. Lungs improved, steroid taper 2. Pain well controlled 3. Will get PT OT eval for placement recs-probably needs order for SNF rehab or CIR 4.Ortho surgery to follow as outpatient. 5. Will transition nebs to MDIs -he is medically ready for discharge when rehab bed is available.  Anderson Malta, DO  Internal Medicine  [phone]    LOS: 4 days   [facility] Pager: [phone] 12/13/2011, 10:43 AM     Assessment/Plan: 71 yo gentleman admitted on 3/16 with comminuted shoulder injury sustained from a fall at home. Also  having a mild COPD exacerbation. Plan in ED was for him to be admitted and that once his COPD was better he would be evaluated by ortho for shoulder surgery.   Patient's breathing is improved, still with scattered wheezes. Patient is upset that ortho has not evaluated him- I explained that we needed to get his lungs better before surgery, but patient insists his breathing is fine and that he came in for his shoulder to "fixed". He lives alone and is refusing outpatient work-up, no transportation or help at home. Best option may be for him to get surgical repair here is even indicated by  ortho and then we can discharge him to rehab [facility] term until he regains his dominant arm/hand function.    Plan: 1. Change to oral prednisone 2. Change to oral antibiotics 3. Continue nebs and transition to inhaler regimen 4. I contacted orthopedic surgery regarding further evaluation and obtaining a formal inpatient consult.   Anderson Malta, DO Internal Medicine [phone]

## 2011-12-13 NOTE — Evaluation (Signed)
Physical Therapy Evaluation Patient Details Name: Blake [name] MRN: [id number] DOB: December 12, 1940 Today's Date: 12/13/2011  Problem List:  Patient Active Problem List  Diagnoses  . COLONIC POLYPS  . DM  . HYPERLIPIDEMIA  . CIGARETTE SMOKER  . INSOMNIA, CHRONIC  . HYPERTENSION  . COPD  . PULMONARY NODULE  . GERD  . DIVERTICULOSIS OF COLON  . ELEVATED PROSTATE SPECIFIC ANTIGEN  . COPD exacerbation  . Humeral fracture    Past Medical History:  Past Medical History  Diagnosis Date  . Pulmonary nodule   . COPD (chronic obstructive pulmonary disease)   . Cigarette smoker   . Hypertension   . Hyperlipidemia   . GERD (gastroesophageal reflux disease)   . Diverticulosis of colon   . Hx of colonic polyps   . Elevated prostate specific antigen (PSA)   . Chronic insomnia   . Diabetes mellitus     no per pt, no meds taken   Past Surgical History:  Past Surgical History  Procedure Date  . Vasectomy   . Needle biopsy rul nodule 01/2009    benign  . Tonsillectomy 1947  . Colonoscopy     PT Assessment/Plan/Recommendation PT Assessment Clinical Impression Statement: 71 y.o. male admitted to [facility] with left humerus fracture s/p fall.  The fracture per ortho MD is non-surgical in nature and is to be treated with a sling.  He presents today with balance deficits, strength deficits and decreased independence with mobility due to having only one arm that he can use.  He was completely independent PTA and only has one living relative (his niece) who lives in [place], [place].   PT Recommendation/Assessment: Patient will need skilled PT in the acute care venue PT Problem List: Decreased strength;Decreased activity tolerance;Decreased balance;Decreased mobility;Decreased knowledge of use of DME Barriers to Discharge Comments: no assist at discharge PT Therapy Diagnosis : Difficulty walking;Abnormality of gait;Generalized weakness PT Plan PT Frequency: Min 3X/week PT Treatment/Interventions: DME  instruction;Gait training;Functional mobility training;Therapeutic activities;Therapeutic exercise;Balance training;Neuromuscular re-education;Patient/family education PT Recommendation Recommendations for Other Services: Rehab consult Follow Up Recommendations: Inpatient Rehab (he would need max HH services at d/c from CIR-including mobile meals due to the fact that he would not likely be able to drive to get food), but I do believe with a few weeks of CIR he could get to modified independent level.   Equipment Recommended: Defer to next venue PT Goals  Acute Rehab PT Goals PT Goal Formulation: With patient Time For Goal Achievement: 2 weeks Pt will go Supine/Side to Sit: with modified independence;with HOB 0 degrees PT Goal: Supine/Side to Sit - Progress: Goal set today Pt will go Sit to Supine/Side: with modified independence;with HOB 0 degrees PT Goal: Sit to Supine/Side - Progress: Goal set today Pt will go Sit to Stand: with modified independence PT Goal: Sit to Stand - Progress: Goal set today Pt will go Stand to Sit: with modified independence PT Goal: Stand to Sit - Progress: Goal set today Pt will Transfer Bed to Chair/Chair to Bed: with modified independence PT Transfer Goal: Bed to Chair/Chair to Bed - Progress: Goal set today Pt will Ambulate: >150 feet;with modified independence;with least restrictive assistive device PT Goal: Ambulate - Progress: Goal set today  PT Evaluation Precautions/Restrictions  Precautions Precautions: Fall Precaution Comments: patient reports completely blind in R eye Required Braces or Orthoses: Yes Other Brace/Splint: sling left arm Restrictions Other Position/Activity Restrictions: I would assume NWB left arm, although no orders written to this effect.   Prior  Functioning  Home Living Lives With: Alone Receives Help From:  (no one) Type of Home: Mobile home Home Layout: One level Home Access: Ramped entrance Home Adaptive Equipment:  Bedside commode/3-in-1;Shower chair with back;Straight cane;Walker - rolling Additional Comments: The patient tripped over something and fell, no other h/o falls Prior Function Level of Independence: Independent with basic ADLs;Independent with homemaking with ambulation;Independent with gait;Independent with transfers Able to Take Stairs?: Yes Driving: Yes Comments: cooks some and goes out to eat some.   Cognition Cognition Arousal/Alertness: Awake/alert Overall Cognitive Status: Appears within functional limits for tasks assessed Cognition - Other Comments: Mildly HOH Extremity Assessment RLE Strength RLE Overall Strength Comments: grossly 4/5 per functional assessment LLE Strength LLE Overall Strength Comments: grossly 4/5 per functional assessment.  No obvious asymmetries noted R vs L Mobility (including Balance) Bed Mobility Bed Mobility: Yes Supine to Sit: 4: Min assist;HOB flat Supine to Sit Details (indicate cue type and reason): min hand held assist to pull up to sitting Sitting - Scoot to Edge of Bed: 6: Modified independent (Device/Increase time) Transfers Transfers: Yes Sit to Stand: 4: Min assist;With upper extremity assist;From bed Sit to Stand Details (indicate cue type and reason): min hand held assist to pull up to standing Stand to Sit: 4: Min assist;With upper extremity assist;With armrests;To chair/3-in-1 Stand to Sit Details: min assist to help control descent to sit in low chair.  Stand Pivot Transfers: 4: Min assist;With armrests Stand Pivot Transfer Details (indicate cue type and reason): min guard assist from bed to recliner chair.   Ambulation/Gait Ambulation/Gait: Yes Ambulation/Gait Assistance: 4: Min assist Ambulation/Gait Assistance Details (indicate cue type and reason): min assist to steady patient for balance.   Ambulation Distance (Feet): 120 Feet Assistive device: 1 person hand held assist Gait Pattern: Step-through pattern (mildly staggering)     End of Session PT - End of Session Equipment Utilized During Treatment:  (Left arm sling) Activity Tolerance: Patient tolerated treatment well Patient left: in chair;with call bell in reach Nurse Communication:  (recommendation for CIR) General Behavior During Session: [facility] for tasks performed Cognition: [facility] for tasks performed  Thaer Miyoshi B. Taksh Hjort, PT, DPT [phone] 12/13/2011, 3:39 PM

## 2011-12-13 NOTE — Clinical Documentation Improvement (Signed)
Abnormal Labs Clarification  THIS DOCUMENT IS NOT A PERMANENT PART OF THE MEDICAL RECORD  TO RESPOND TO THE THIS QUERY, FOLLOW THE INSTRUCTIONS BELOW:  1. If needed, update documentation for the patient's encounter via the notes activity.  2. Access this query again and click edit on the [employer].  3. After updating, or not, click F2 to complete all highlighted (required) fields concerning your review. Select "additional documentation in the medical record" OR "no additional documentation provided".  4. Click Sign note button.  5. The deficiency will fall out of your InBasket *Please let us know if you are not able to complete this workflow by phone or e-mail (listed below).  Please update your documentation within the medical record to reflect your response to this query.                                                                                   12/13/11  Dear Dr.  Phillips Odor  Marton Redwood  In a better effort to capture your patient's severity of illness, reflect appropriate length of stay and utilization of resources, a review of the medical record has revealed the following indicators.    Based on your clinical judgment, please clarify and document in a progress note and/or discharge summary the clinical condition associated with the following supporting information:  In responding to this query please exercise your independent judgment.  The fact that a query is asked, does not imply that any particular answer is desired or expected.  Abnormal findings (laboratory, x-ray, pathologic, and other diagnostic results) are not coded and reported unless the physician indicates their clinical significance.   The medical record reflects the following clinical findings, please clarify the diagnostic and/or clinical significance:      Possible Clinical Conditions?  - Acute Respiratory Failure - Chronic Respiratory Failure - Acute on Chronic Respiratory Failure - Other Condition  (please specify) - Cannot Clinically Determine  Clinical Indicators - Lab Test:  Stat ABG 3/15 (Arterial) - Patient Values: pCO2: 27.3,  pO2: 74.0,  Bicarbonate: 16.9,  Acid-base deficit: 6.0   - Sx: 3/15 "He is in respiratory distress", SOB, RR 24, wheezes, malaise/fatigue, hemoptysis, COPD exacerbation - Treatment: nebs, steroids and antibiotics, O2 keep sats 92%  Reviewed:  no additional documentation provided   Thank You,  Beverley Fiedler RN Clinical Documentation Specialist: CELL:  [phone] Health Information Management [place]

## 2011-12-13 NOTE — Progress Notes (Signed)
Orthopedics Progress Note  Subjective: I am feeling better today and want to get up and walk.  Objective:  Filed Vitals:   12/13/11 0600  BP: 138/76  Pulse: 62  Temp: 98 F (36.7 C)  Resp: 18    General: Awake and alert  Musculoskeletal: swollen minimally tender let arm in sling immobilizer. Neurovascularly intact  Lab Results  Component Value Date   WBC 9.2 12/10/2011   HGB 15.2 12/10/2011   HCT 41.3 12/10/2011   MCV 100.7* 12/10/2011   PLT 119* 12/10/2011       Component Value Date/Time   NA 129* 12/10/2011 2339   K 4.1 12/10/2011 2339   CL 97 12/10/2011 2339   CO2 19 12/10/2011 2339   GLUCOSE 176* 12/10/2011 2339   BUN 18 12/10/2011 2339   CREATININE 0.68 12/10/2011 2339   CALCIUM 10.0 12/10/2011 2339   GFRNONAA >90 12/10/2011 2339   GFRAA >90 12/10/2011 2339    Lab Results  Component Value Date   INR 1.0 02/11/2009    Assessment/Plan: Left comminuted proximal humerus fracture. I have reviewed the patient's XRAYs and discussed with the patient that this fracture pattern is best treated non-surgically.  There is good muscle support for this fracture with balanced deforming forces that should yield an excellent result.  Surgery would be extremely difficult with unpredictable results and a fairly high complication rate.  The patient is in agreement with this plan. Will need to keep him immobilized for now, planning on starting OT for the shoulder in about 4 weeks.  Will need weekly XRAYs.  Recommend sleeping upright.  Will follow. Mobilize with nursing and PT as tolerated with assistance!  Thanks,  Beverely Low, MD [facility] [phone]  Almedia Balls. Ranell Patrick, MD 12/13/2011 7:28 AM

## 2011-12-13 NOTE — Progress Notes (Signed)
   CARE MANAGEMENT NOTE 12/13/2011  Patient:  Blake [name], Blake [name]   Account Number:  [id number]  Date Initiated:  12/13/2011  Documentation initiated by:  Darlyne Russian  Subjective/Objective Assessment:   Patient admitted with humeral fracture     Action/Plan:   Patient admitted from home, lives alone.   Anticipated DC Date:  12/14/2011   Anticipated DC Plan:  HOME W HOME HEALTH SERVICES  In-house referral  Clinical Social Worker      DC Planning Services  CM consult      Choice offered to / List presented to:             Status of service:  In process, will continue to follow Medicare Important Message given?   (If response is "NO", the following Medicare IM given date fields will be blank) Date Medicare IM given:   Date Additional Medicare IM given:    Discharge Disposition:    Per UR Regulation:    If discussed at Long Length of Stay Meetings, dates discussed:    Comments:  PCP: Alroy Dust MD  12/13/2011 4:00 pm Darlyne Russian RN, CCM [phone]  Met with patient to discuss discharge planning. He states he lives alone and not able to care for himself. He said MD will send him to a rehab center.  CM will follow up with MD. CM to continue to follow for discharge needs. Per Dr Phillips Odor patient for transfer to facility not  home. Call to [facility] to notify of plans for transfer to facility.

## 2011-12-14 DIAGNOSIS — W19XXXA Unspecified fall, initial encounter: Secondary | ICD-10-CM

## 2011-12-14 DIAGNOSIS — S42209A Unspecified fracture of upper end of unspecified humerus, initial encounter for closed fracture: Secondary | ICD-10-CM

## 2011-12-14 LAB — URINALYSIS, ROUTINE W REFLEX MICROSCOPIC
Bilirubin Urine: NEGATIVE
Nitrite: NEGATIVE
Specific Gravity, Urine: 1.016 (ref 1.005–1.030)
pH: 6.5 (ref 5.0–8.0)

## 2011-12-14 LAB — GLUCOSE, CAPILLARY: Glucose-Capillary: 100 mg/dL — ABNORMAL HIGH (ref 70–99)

## 2011-12-14 MED ORDER — NICOTINE 14 MG/24HR TD PT24: 1.0000 | MEDICATED_PATCH | Freq: Every day | TRANSDERMAL | Status: AC

## 2011-12-14 MED ORDER — ALPRAZOLAM 0.25 MG PO TABS
0.2500 mg | ORAL_TABLET | Freq: Two times a day (BID) | ORAL | Status: DC
  Administered 2011-12-14: 0.25 mg via ORAL
  Filled 2011-12-14: qty 1

## 2011-12-14 MED ORDER — MOXIFLOXACIN HCL 400 MG PO TABS: 400.0000 mg | ORAL_TABLET | Freq: Every day | ORAL | Status: AC

## 2011-12-14 MED ORDER — FLUTICASONE-SALMETEROL 250-50 MCG/DOSE IN AEPB: 1.0000 | INHALATION_SPRAY | Freq: Two times a day (BID) | RESPIRATORY_TRACT | Status: DC

## 2011-12-14 MED ORDER — GUAIFENESIN ER 600 MG PO TB12: 600.0000 mg | ORAL_TABLET | Freq: Two times a day (BID) | ORAL | Status: DC | PRN

## 2011-12-14 MED ORDER — TIOTROPIUM BROMIDE MONOHYDRATE 18 MCG IN CAPS: 18.0000 ug | ORAL_CAPSULE | Freq: Every day | RESPIRATORY_TRACT | Status: DC

## 2011-12-14 MED ORDER — NICOTINE 14 MG/24HR TD PT24
14.0000 mg | MEDICATED_PATCH | Freq: Every day | TRANSDERMAL | Status: DC
  Administered 2011-12-14: 14 mg via TRANSDERMAL
  Filled 2011-12-14 (×4): qty 1

## 2011-12-14 MED ORDER — LORAZEPAM 0.5 MG PO TABS
0.5000 mg | ORAL_TABLET | Freq: Once | ORAL | Status: AC
  Administered 2011-12-14: 0.5 mg via ORAL
  Filled 2011-12-14: qty 1

## 2011-12-14 MED ORDER — ALPRAZOLAM 0.25 MG PO TABS: 0.2500 mg | ORAL_TABLET | Freq: Two times a day (BID) | ORAL | Status: DC

## 2011-12-14 NOTE — Progress Notes (Signed)
Orthopedics Progress Note  Subjective: Pt resting with mild pain to left shoulder currently in a sling for comfort and support  Objective:  Filed Vitals:   12/14/11 0500  BP: 160/87  Pulse: 56  Temp: 97.4 F (36.3 C)  Resp: 20    General: Awake and alert  Musculoskeletal: left upper extremity, nv intact distally, no rom today due to fx Neurovascularly intact  Lab Results  Component Value Date   WBC 7.6 12/13/2011   HGB 13.4 12/13/2011   HCT 37.9* 12/13/2011   MCV 100.8* 12/13/2011   PLT 154 12/13/2011       Component Value Date/Time   NA 135 12/13/2011 1245   K 4.2 12/13/2011 1245   CL 102 12/13/2011 1245   CO2 22 12/13/2011 1245   GLUCOSE 172* 12/13/2011 1245   BUN 16 12/13/2011 1245   CREATININE 0.68 12/13/2011 1245   CALCIUM 9.7 12/13/2011 1245   GFRNONAA >90 12/13/2011 1245   GFRAA >90 12/13/2011 1245    Lab Results  Component Value Date   INR 1.0 02/11/2009    Assessment/Plan: S/p left proximal humerus fracture  Currently recommend continued non operative treatment Pain control Will re x-ray next week either in the hospital if pt is still inpatient or in our office for follow up Pt to follow up in our office 1 week s/p discharge   Viviann Spare R. Ranell Patrick, MD 12/14/2011 11:22 AM

## 2011-12-14 NOTE — Progress Notes (Signed)
Clinical Social Worker (CSW) informed by MD that pt is alert and oriented and okay for dc today. CSW left a message for [facility] as to whether they can accept pt today.   Theresia Bough, MSW, Theresia Majors [phone]

## 2011-12-14 NOTE — Progress Notes (Signed)
Covering Clinical Child psychotherapist (CSW) provided pt with bed offers. CSW observed that pt appeared confused. Pt answers to CSW questions were not all appropriate. CSW spoke with RN who informed CSW that pt has been confused today. CSW explored whether pt has any family however pt stated they are all deceased. CSW asked pt if he knew who his emergency contact was: [employer] and pt stated she was deceased. CSW contacted the individual was happened to be married to pt brother who passed in the past year. Debbie stated she hasn't seen pt in over a year and pt does not have family but a niece who lives in another city and no contact available. CSW returned to pt room and asked if pt had a POA. Pt stated his adopted son Herbert Spires [phone] is his POA. CSW left a message for individual. At the moment pt capacity is questionable and [facility] stated pt would need to have someone sign him in. CSW has informed MD of this information.  Theresia Bough, MSW, Theresia Majors [phone]

## 2011-12-14 NOTE — Consult Note (Signed)
Physical Medicine and Rehabilitation Consult Reason for Consult: left humerus fracture Referring Phsyician:  Dr. Phillips Odor    HPI: Blake [name] is an 71 y.o. left handed male with history of left comminuted humerus fracture two days PTA 03/15 with SOB, wheeze and left arm pain. Started on IV steroids and IV antibiotics for COPD exacerbation and left UE placed in a sling. Evaluated by Dr. Thomasena Edis who recommended immobilization of shoulder.  No need for surgery reiterated by Dr. Ranell Patrick as patient with concerns regarding ability to care for self at home. Respiratory status improving. PT evaluation done yesterday. MD, PT recommending CIR.   Review of Systems  Respiratory: Negative for sputum production and shortness of breath.   Cardiovascular: Negative for chest pain.  Gastrointestinal: Negative for constipation.  Genitourinary: Negative for urgency.  Musculoskeletal: Positive for joint pain.  All other systems reviewed and are negative.   Past Medical History  Diagnosis Date  . Pulmonary nodule   . COPD (chronic obstructive pulmonary disease)   . Cigarette smoker   . Hypertension   . Hyperlipidemia   . GERD (gastroesophageal reflux disease)   . Diverticulosis of colon   . Hx of colonic polyps   . Elevated prostate specific antigen (PSA)   . Chronic insomnia   . Diabetes mellitus     no per pt, no meds taken   Past Surgical History  Procedure Date  . Vasectomy   . Needle biopsy rul nodule 01/2009    benign  . Tonsillectomy 1947  . Colonoscopy    Family History  Problem Relation Age of Onset  . Lung cancer Brother   . Diabetes Father   . Colon cancer Neg Hx   . Esophageal cancer Neg Hx   . Stomach cancer Neg Hx    Social History:  Single. Lives alone- no local family or friends who can assist past discharge. He reports that he has been smoking Cigarettes.  He has a 55 pack-year smoking history. He has never used smokeless tobacco. He reports that he drinks about 14.4 ounces  of alcohol per week. He reports that he does not use illicit drugs.    No Known Allergies  Prior to Admission medications   Medication Sig Start Date End Date Taking? Authorizing Provider  atenolol (TENORMIN) 50 MG tablet Take 50 mg by mouth daily. 05/10/11 05/09/12 Yes Michele Mcalpine, MD  folic acid (FOLVITE) 1 MG tablet Take 1 mg by mouth daily. 05/10/11 05/09/12 Yes Michele Mcalpine, MD  gemfibrozil (LOPID) 600 MG tablet Take 600 mg by mouth 2 (two) times daily before a meal. 05/10/11 05/09/12 Yes Michele Mcalpine, MD  ramipril (ALTACE) 5 MG capsule Take 5 mg by mouth at bedtime. 05/10/11 05/09/12 Yes Michele Mcalpine, MD  tadalafil (CIALIS) 20 MG tablet Take 20 mg by mouth daily as needed. For ED   Yes Historical Provider, MD  temazepam (RESTORIL) 30 MG capsule Take 30 mg by mouth at bedtime as needed. For insomnia 05/10/11 05/09/12 Yes Michele Mcalpine, MD   Scheduled Medications:    . atenolol  50 mg Oral Daily  . enoxaparin  40 mg Subcutaneous Q24H  . Fluticasone-Salmeterol  1 puff Inhalation BID  . folic acid  1 mg Oral Daily  . gemfibrozil  600 mg Oral BID AC  . guaiFENesin  600 mg Oral BID  . insulin aspart  0-5 Units Subcutaneous QHS  . insulin aspart  0-9 Units Subcutaneous TID WC  . LORazepam  0.5  mg Oral Once  . moxifloxacin  400 mg Oral q1800  . nicotine  14 mg Transdermal Daily  . ramipril  5 mg Oral QHS  . tiotropium  18 mcg Inhalation Daily  . DISCONTD: albuterol  2.5 mg Nebulization Q6H  . DISCONTD: ipratropium  0.5 mg Nebulization Q6H  . DISCONTD: predniSONE  60 mg Oral Q breakfast   PRN MED's: acetaminophen, acetaminophen, albuterol, HYDROmorphone, ondansetron (ZOFRAN) IV, ondansetron, temazepam, DISCONTD: albuterol Home: Home Living Lives With: Alone Receives Help From:  (no one) Type of Home: Mobile home Home Layout: One level Home Access: Ramped entrance Home Adaptive Equipment: Bedside commode/3-in-1;Shower chair with back;Straight cane;Walker - rolling Additional  Comments: The patient tripped over something and fell, no other h/o falls  Functional History: Prior Function Level of Independence: Independent with basic ADLs;Independent with homemaking with ambulation;Independent with gait;Independent with transfers Able to Take Stairs?: Yes Driving: Yes Comments: cooks some and goes out to eat some.   Functional Status:  Mobility: Bed Mobility Bed Mobility: Yes Supine to Sit: 4: Min assist;HOB flat Supine to Sit Details (indicate cue type and reason): min hand held assist to pull up to sitting Sitting - Scoot to Edge of Bed: 6: Modified independent (Device/Increase time) Transfers Transfers: Yes Sit to Stand: 4: Min assist;With upper extremity assist;From bed Sit to Stand Details (indicate cue type and reason): min hand held assist to pull up to standing Stand to Sit: 4: Min assist;With upper extremity assist;With armrests;To chair/3-in-1 Stand to Sit Details: min assist to help control descent to sit in low chair.  Stand Pivot Transfers: 4: Min assist;With armrests Stand Pivot Transfer Details (indicate cue type and reason): min guard assist from bed to recliner chair.   Ambulation/Gait Ambulation/Gait: Yes Ambulation/Gait Assistance: 4: Min assist Ambulation/Gait Assistance Details (indicate cue type and reason): min assist to steady patient for balance.   Ambulation Distance (Feet): 120 Feet Assistive device: 1 person hand held assist Gait Pattern: Step-through pattern (mildly staggering)    ADL:    Cognition: Cognition Arousal/Alertness: Awake/alert Orientation Level: Oriented X4 Cognition Arousal/Alertness: Awake/alert Overall Cognitive Status: Appears within functional limits for tasks assessed Orientation Level: Oriented X4 Cognition - Other Comments: Mildly HOH  Blood pressure 160/87, pulse 56, temperature 97.4 F (36.3 C), temperature source Oral, resp. rate 20, height 5' 10.5" (1.791 m), weight 74.98 kg (165 lb 4.8 oz),  SpO2 96.00%.  Physical Exam  Constitutional: He is oriented to person, place, and time. He appears well-developed and well-nourished.  HENT:  Head: Normocephalic and atraumatic.  Eyes: Pupils are equal, round, and reactive to light.  Neck: Normal range of motion. Neck supple.  Cardiovascular: Normal rate and regular rhythm.   Pulmonary/Chest: He has wheezes.  Abdominal: Soft. Bowel sounds are normal.  Musculoskeletal: He exhibits tenderness.       Diffuse ecchymosis left shoulder and surrounding area. LUE in sling. Limb is appropriately tender  Neurological: He is alert and oriented to person, place, and time. He has normal reflexes. No cranial nerve deficit or sensory deficit.       Normal motor exam except for an injured left upper extremity in sling  Skin: Skin is warm and dry.  Psychiatric:       Patient alert but irritable at times. Lacks insight and awareness    Results for orders placed during the hospital encounter of 12/09/11 (from the past 24 hour(s))  GLUCOSE, CAPILLARY     Status: Abnormal   Collection Time   12/13/11 12:09 PM  Component Value Range   Glucose-Capillary 181 (*) 70 - 99 (mg/dL)  BASIC METABOLIC PANEL     Status: Abnormal   Collection Time   12/13/11 12:45 PM      Component Value Range   Sodium 135  135 - 145 (mEq/L)   Potassium 4.2  3.5 - 5.1 (mEq/L)   Chloride 102  96 - 112 (mEq/L)   CO2 22  19 - 32 (mEq/L)   Glucose, Bld 172 (*) 70 - 99 (mg/dL)   BUN 16  6 - 23 (mg/dL)   Creatinine, Ser 4.09  0.50 - 1.35 (mg/dL)   Calcium 9.7  8.4 - 81.1 (mg/dL)   GFR calc non Af Amer >90  >90 (mL/min)   GFR calc Af Amer >90  >90 (mL/min)  CBC     Status: Abnormal   Collection Time   12/13/11 12:45 PM      Component Value Range   WBC 7.6  4.0 - 10.5 (K/uL)   RBC 3.76 (*) 4.22 - 5.81 (MIL/uL)   Hemoglobin 13.4  13.0 - 17.0 (g/dL)   HCT 91.4 (*) 78.2 - 52.0 (%)   MCV 100.8 (*) 78.0 - 100.0 (fL)   MCH 35.6 (*) 26.0 - 34.0 (pg)   MCHC 35.4  30.0 - 36.0  (g/dL)   RDW 95.6  21.3 - 08.6 (%)   Platelets 154  150 - 400 (K/uL)  GLUCOSE, CAPILLARY     Status: Abnormal   Collection Time   12/13/11  4:25 PM      Component Value Range   Glucose-Capillary 201 (*) 70 - 99 (mg/dL)  GLUCOSE, CAPILLARY     Status: Abnormal   Collection Time   12/13/11 10:04 PM      Component Value Range   Glucose-Capillary 128 (*) 70 - 99 (mg/dL)  GLUCOSE, CAPILLARY     Status: Abnormal   Collection Time   12/14/11  7:41 AM      Component Value Range   Glucose-Capillary 112 (*) 70 - 99 (mg/dL)   Comment 1 Notify RN     No results found.  Assessment/Plan: Diagnosis: Proximal left humerus fracture 1. Does the need for close, 24 hr/day medical supervision in concert with the patient's rehab needs make it unreasonable for this patient to be served in a less intensive setting? No 2. Co-Morbidities requiring supervision/potential complications: Diabetes, COPD 3. Due to bladder management, bowel management and safety, does the patient require 24 hr/day rehab nursing? No 4. Does the patient require coordinated care of a physician, rehab nurse, PT and OT to address physical and functional deficits in the context of the above medical diagnosis(es)? No Addressing deficits in the following areas: balance, dressing and grooming 5. Can the patient actively participate in an intensive therapy program of at least 3 hrs of therapy per day at least 5 days per week? Potentially 6. The potential for patient to make measurable gains while on inpatient rehab is fair and poor 7. Anticipated functional outcomes upon discharge from inpatient rehab are: not applicable 8. Estimated rehab length of stay to reach the above functional goals is: Not applicable 9. Does the patient have adequate social supports to accommodate these discharge functional goals? No 10. Anticipated D/C setting: Other 11. Anticipated post D/C treatments: HH therapy 12. Overall Rehab/Functional Prognosis:  good  RECOMMENDATIONS: This patient's condition is appropriate for continued rehabilitative care in the following setting: SNF Patient has agreed to participate in recommended program. No Note that insurance prior authorization may  be required for reimbursement for recommended care.  Comment: Patient is not a candidate for inpatient rehabilitation given his expected recovery course and available social supports.   Ranelle Oyster M.D. 12/14/2011

## 2011-12-14 NOTE — Progress Notes (Signed)
Clinical Social Work Department BRIEF PSYCHOSOCIAL ASSESSMENT 12/14/2011  Patient:  Blake [name], Blake [name]     Account Number:  [id number]     Admit date:  12/09/2011  Clinical Social Worker:  Lourdes Sledge  Date/Time:  12/14/2011 12:02 PM  Referred by:  Physician  Date Referred:  12/13/2011 Referred for  SNF Placement   Other Referral:   Interview type:  Patient Other interview type:    PSYCHOSOCIAL DATA Living Status:  ALONE Admitted from facility:   Level of care:   Primary support name:  Herbert Spires (adopted son) [phone] Primary support relationship to patient:  CHILD, ADULT Degree of support available:   Pt states he is pt POA. Pt lives at home with very little to no family support.    CURRENT CONCERNS Current Concerns  Post-Acute Placement   Other Concerns:    SOCIAL WORK ASSESSMENT / PLAN Late Entry: Covering CSW informed pt ready for discharge. CSW provided pt with a SNF list as as pt is agreeable to [facility] rehab. Pt prefers placement at [facility]. CSW has faxed pt out and provided bed offers.   Assessment/plan status:  Psychosocial Support/Ongoing Assessment of Needs Other assessment/ plan:   Information/referral to community resources:   SNF List    PATIENT'S/FAMILY'S RESPONSE TO PLAN OF CARE: Patient is alert and confusion has cleared/ Pt states he would like rehab at discharge preferably [facility].

## 2011-12-14 NOTE — Progress Notes (Signed)
Evaluated patient - he seems to be calm and not agitated. He is oriented to person, place, and situation. He is requesting [facility], I explained that he was probably not a candidate given his functional status-he then asked if he could go to [facility] facility specifically for rehab. He was able to tell me specific details about his needs. He currently has full capacity for his own decision making. Prior to admission lived alone w/o assistance. Medically stable. Will prepare discharge.

## 2011-12-14 NOTE — Discharge Summary (Addendum)
Physician Discharge Summary  Patient ID: ZACKERIAH KISSLER MRN: [id number] DOB/AGE: 1941/01/03 71 y.o.  Admit date: 12/09/2011 Discharge date: 12/14/2011  Primary Care Physician:  Michele Mcalpine, MD, MD   Discharge Diagnoses:   1. Comminuted Shoulder Fracture, followed by Dr. Ranell Patrick at [facility]  -Recs are non-surgical, PT and OT Immmobilize for 1 week and then ortho re-eval. 2. COPD exacerbation, resolved 3. GERD 4. Anxiety 5. DM 6. Hypertension 7. HLD   Medication List  As of 12/14/2011 12:32 PM   ASK your doctor about these medications         atenolol 50 MG tablet   Commonly known as: TENORMIN   Take 50 mg by mouth daily.      folic acid 1 MG tablet   Commonly known as: FOLVITE   Take 1 mg by mouth daily.      gemfibrozil 600 MG tablet   Commonly known as: LOPID   Take 600 mg by mouth 2 (two) times daily before a meal.      ramipril 5 MG capsule   Commonly known as: ALTACE   Take 5 mg by mouth at bedtime.      tadalafil 20 MG tablet   Commonly known as: CIALIS   Take 20 mg by mouth daily as needed. For ED      temazepam 30 MG capsule   Commonly known as: RESTORIL   Take 30 mg by mouth at bedtime as needed. For insomnia           Disposition and Follow-up:  [facility] condition. He needs to be followed up for his left humeral fracture at [facility] orthopedics by Dr. Ranell Patrick in one week.  Consults:  Orthopedics  Significant Diagnostic Studies:  Dg Chest 2 View  12/10/2011  *RADIOLOGY REPORT*  Clinical Data: Cough  CHEST - 2 VIEW  Comparison: 08/24/2011  Findings: Multiple remote rib fractures bilaterally.  Deformity to the left humerus, in keeping with known fracture.  Bilateral lower lobe subcentimeter nodules presumably represent nipple shadows, and appears similar to prior.  No pleural effusion.  No pneumothorax. Mild aortic arch atherosclerosis.  Otherwise,  cardiomediastinal contours within normal limits.  IMPRESSION: No acute cardiopulmonary process.  Comminuted proximal left humeral fracture.  Original Report Authenticated By: Waneta Martins, M.D.   Dg Humerus Left  12/09/2011  *RADIOLOGY REPORT*  Clinical Data: Left humeral pain status post fall.  LEFT HUMERUS - 2+ VIEW  Comparison: None.  Findings: Comminuted fracture of the left humeral neck and proximal shaft.  Humeral head remains seated within the glenoid.  IMPRESSION: Comminuted left humeral neck/proximal shaft fracture.  Original Report Authenticated By: Waneta Martins, M.D.    Brief H and P: This is a 70 year old gentleman who was admitted on 12/10/2011 after he sustained a fall at home 2 days prior to admission he fell on his left shoulder and immediately began having pain in that shoulder. He did not come in for evaluation until he became more debilitated at home and was unable to take care of himself. He also began to have shortness of breath and came to the emergency room for evaluation. He has a known history of COPD but has not been taking his inhalers for several months and he continues to be a smoker. On presentation the emergency room he was complaining of severe left shoulder pain from his fall as well as shortness of breath was associated with  wheezing his oxygen saturations were in the low 90s he responded well to bronchodilators and was admitted for treatment of COPD exacerbation and also for comminuted shoulder fracture.   Hospital Course:  Principal Problem:  *Humeral fracture Active Problems:  DM  CIGARETTE SMOKER  GERD  COPD exacerbation  1. COPD exacerbation: The patient was admitted and managed for his COPD exacerbation. He received IV steroids, bronchodilators, and empiric Avelox for community-acquired pneumonia. Currently his oxygen saturations are normal he is symptomatically improved. He has been transitioned onto MDI inhalers Advair and Spiriva with when  necessary albuterol nebs. He has also been placed on a nicotine patch for smoking cessation.  2. Shoulder Fracture, Left comminuted: Currently recommend continued non operative treatment. Pain is well controlled on current medications. Patient needs to follow up in ortho office in 1 week s/p discharge. Continue immbolization for now.  3. Anxiety: Will continue his on his home dose of Alprazolam for anxiety. He does have full capacity at the time of my evaluation this am.   Time spent on Discharge: 60 min  Signed: [facility] Pager: [phone] 12/14/2011, 12:32 PM

## 2011-12-14 NOTE — Progress Notes (Signed)
   Pt explained role of OT. OT explained importance of correct sling position. Pt refused to participate with OT at this time. Will check back at schedule allows.  Lise Auer, OT

## 2011-12-14 NOTE — Progress Notes (Signed)
Patient will need SNF rehab for he lacks support at home which he will need in the [facility] term. Not a candidate for inpatient acute rehab. Please call for any questions. Pager [phone]

## 2011-12-14 NOTE — Progress Notes (Signed)
Clinical Child psychotherapist (CSW) prepared pt [employer], faxed dc summary to [facility] and contacted [facility] for a 2pm transport. CSW informed pt who was pleased with discharged and appreciative of CSW assistance. No further needs addressed. CSW is signing off.  Theresia Bough, MSW, Theresia Majors [phone]

## 2011-12-27 ENCOUNTER — Other Ambulatory Visit: Payer: Self-pay | Admitting: *Deleted

## 2011-12-27 MED ORDER — ALPRAZOLAM 0.25 MG PO TABS: 0.2500 mg | ORAL_TABLET | Freq: Two times a day (BID) | ORAL | Status: AC

## 2011-12-27 MED ORDER — TEMAZEPAM 30 MG PO CAPS: 30.0000 mg | ORAL_CAPSULE | Freq: Every evening | ORAL | Status: DC | PRN

## 2012-01-10 ENCOUNTER — Ambulatory Visit: Payer: Medicare Other | Admitting: Adult Health

## 2012-01-10 ENCOUNTER — Ambulatory Visit (INDEPENDENT_AMBULATORY_CARE_PROVIDER_SITE_OTHER): Payer: Medicare Other | Admitting: Adult Health

## 2012-01-10 ENCOUNTER — Encounter: Payer: Self-pay | Admitting: Adult Health

## 2012-01-10 VITALS — BP 124/74 | HR 66 | Temp 98.8°F | Ht 71.0 in | Wt 178.6 lb

## 2012-01-10 DIAGNOSIS — J441 Chronic obstructive pulmonary disease with (acute) exacerbation: Secondary | ICD-10-CM

## 2012-01-10 NOTE — Progress Notes (Signed)
Subjective:    Patient ID: Blake [name], male    DOB: [birth year], 71 y.o.   MRN: [id number]  HPI 71 y/o WM   Followed for general medical purposes w/ hx of COPD, still smoking 1ppd, HBP, Hyperlipidemia, DM, & chr persistant insomnia...  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 he continues his 1ppd smoking & still not interested in smoking cessation help "I enjoy it too much- esp with the beers"... he denies breathing problems;  BP remains controlled on meds;  Lipids look good on Gemfib;  BS remains stable w/o meds;  he requests 90d refill perscriptions...  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 These forms were completed in the presence of the pt & subseq mailed to [place]...  He reports stable medically> still smoking, breathing OK & denies cough/ sputum/ hemoptysis/ etc; BP controlled on meds; Chol looks good on Lopid; DM not an issue (on diet alone & BS's have all been good); states he's quit liquor 7 only drinking 3 beers/d...  ~  May 18, 2011:  65mo ROV & he reports doing satis> except that he fell (?tripped) several weeks ago w/ some pain right lat rib cage, already improving on it's own he says;  Still smoking 1ppd, & as noted prev he has stopped hard liquor & "just drinking 3 beers per day";  BP controlled on Aten & Altace;  Lipids have been satis on Gemfib;  GI stable on OTC Prilosec & due for f/u colonoscopy soon...  See prob list below, we reviewed meds & prev labs...  ~  November 22, 2011:  46mo ROV & Blake [name] reports doing satis, no new complaints or concerns;  He brings in a [place] Form [id number] for Property Tax Exclusion based on total disability & since he is blind in right eye, has COPD (& getting worse since he smokes 1ppd), & has hx mild alcoholic hepatitis (& still drinks beer every day)> that he qualifies for being disabled...     Still smoking  1ppd and has no inerest in quitting; PFTs today w/ mod airflow obstruction & worse compared to 2006; offered inhaled bronchodil meds but he declines & says he wouldn't use them even if I wrote for them; we reviewed smoking cessation strategies...    BP controlled on meds; denies CP, palpit, SOB, edema, etc...    FLP looks great on Gemfib bid w/ HDL= 123... LABS today showed worsening LFTs c/w worsenng AlcHepatitis & he is told in no uncertain terms to quit all Etoh or he will develop cirrhosis likely die from the consequences; he understands but did not commit to quitting...  See prob list below>>      01/10/2012 Post hospital follow up  Patient returns for post hospital followup. Patient was admitted March 14th 2 12/14/2011 for a comminuted shoulder fracture on the left. Patient had fallen in am and required admission. His hospitalization was complicated by COPD exacerbation. He was treated with aggressive pulmonary hygiene, IV steroids, and antibiotics. Chest x-ray showed no acute process. Patient did require discharge to rehabilitation center for assistance and physical therapy. He was discharged approximately one month ago.  Since discharge. He reports that he is feeling better. Continues to have some left shoulder pain  Has ov with ortho tomorrow. His breathing is back  to his baseline and he is currently on Advair and Spiriva. He denies any hemoptysis, chest pain, orthopnea, PND. Has noticed some increased lower extremity edema, especially in the afternoon hours.        Problem List:  COPD (ICD-496) PULMONARY NODULES (ICD-518.89) CIGARETTE SMOKER (ICD-305.1) - he continues to smoke 1ppd and has min cough w/ occas sputum production... uses MUCINEX Prn, has tried & failed Chantix in past, he has no interest in smoking cessation counselling or help! ~  baseline CXR w/ old R rib fxs & chr changes... ~  PFTs 10/06 showing FVC=4.12 (95%pred), FEV1=3.00 (86%pred), %1sec=73 & mid-flows= 41%pred...  ~   CXR 1/09 showed COPD, stable, NAD. ~  CXR 1/10 showed faint nodular opacities in the upper lobes & mild COPD changes. ~  CT Chest 2/10 showed mult bilat 5-66mm upper lobe nodules- f/u in 33mo rec... ~  CXR 5/10 showed COPD, scarring, ?enlargement of RUL nodule to 14mm. ~  PET-CT 5/10 showed mult bilat nodular opac ?progressive but only weakly PET pos & Bx RUL= neg.  ~  CXR 9/10 showed improved appearance w/o nodules seen! ~  CXR 1/11 showed similar chr changes, and no nodules seen. ~  CXR 1/12 showed COPD, NAD- no nodules seen, old right rib fxs. ~  CXR 11/12 showed COPD, clear lungs, norm hear size, old right rib fxs... ~  PFT 2/13 showed FVC=3.85 (84%), FEV1=2.37 (68%), %1sec=62, Mid-flows= 42% predicted...  HYPERTENSION (ICD-401.9) - on ALTACE 5mg /d  & ATENOLOL 50mg /d... BP= 142/90 here today and even better at home he says... takes meds regularly without side effects.Marland Kitchen denies HA, fatigue, visual changes, CP, palipit, dizziness, syncope, dyspnea, edema, etc... renal function has been normal. ~  1/10:  We decided to increase his Atenolol to 50mg /d, & Altace to 5mg /d... BP's improved thereafter...  HYPERLIPIDEMIA (ICD-272.4) - he has hypertryglyceridemia and takes GEMFIBRIZOL 600mg Bid...  he needs to quit the daily alcohol consumption... ~  FLP 7/08 showed TChol 198, TG 240, HDL 72, LDL 91 ~  FLP 1/09 showed TChol 162, TG 204, HDL 54, LDL 78... rec- same med, stop Etoh... ~  FLP 1/10 showed TChol 152, TG 86, HDL 61, LDL 74 ~  FLP 1/11 showed TChol 168, TG 60, HDL 66, LDL 90... rec> continue same. ~  FLP 1/12 on Gemfib600Bid showed TChol 208, TG 70, HDL 106, LDL 80 ~  FLP 2/13 on Gemfib600Bid showed TChol 189, TG 54, HDL 123, LDL 55  DM (ICD-250.00) - on diet alone, but still drinks to excess...  ~  labs 7/08 showed BS= 172 & HgA1c=5.8 ~  labs 1/09 showed BS= 129, & HgA1c= 5.1 ~  labs 1/10 showed BS= 120, HgA1c= 5.2 ~  5/10: states he's quit whiskey, only drinking 6pk beer daily. ~  labs  1/11 showed BS= 105 ~  labs 1/12 showed  BS= 82 ~  Labs 2/13 showed BS= 76  GERD (ICD-530.81) - no prev EGD> takes OTC Prilosec as needed.  DIVERTICULOSIS OF COLON (ICD-562.10) COLONIC POLYPS (ICD-211.3) - last colonoscopy was 10/07 by DrDBrodie...several 4 mm polyps removed = tubular ademomas and f/u 5 years... ~  10/12: f/u colonoscopy by DrDBrodie showed divertics and 3 polyps- largest 10mm sessile bx pos for tubular adenoma; repeat colon in 8yrs...  ELEVATED PROSTATE SPECIFIC ANTIGEN (ICD-790.93) & ED - uses Cialis Prn... ~  labs 1/11 showed PSA= 0.92 ~  labs 1/12 showed PSA= 7.39... rec antibiotic course & repeat PSA 1 month (1.75= normal now) ~  Labs 2/13  showed PSA= 0.64  INSOMNIA, CHRONIC (ICD-307.42) - he uses TEMAZEPAM 30mg Qhs...   Past Surgical History  Procedure Date  . Vasectomy   . Needle biopsy rul nodule 01/2009    benign  . Tonsillectomy 1947  . Colonoscopy     Outpatient Encounter Prescriptions as of 01/10/2012  Medication Sig Dispense Refill  . ALPRAZolam (XANAX) 0.25 MG tablet Take 1 tablet (0.25 mg total) by mouth 2 (two) times daily.  60 tablet  5  . atenolol (TENORMIN) 50 MG tablet Take 50 mg by mouth daily.      . Fluticasone-Salmeterol (ADVAIR) 250-50 MCG/DOSE AEPB Inhale 1 puff into the lungs 2 (two) times daily.  60 each    . folic acid (FOLVITE) 1 MG tablet Take 1 mg by mouth daily.      Marland Kitchen guaiFENesin (MUCINEX) 600 MG 12 hr tablet Take 1 tablet (600 mg total) by mouth 2 (two) times daily as needed for congestion.      . ramipril (ALTACE) 5 MG capsule Take 5 mg by mouth at bedtime.      . temazepam (RESTORIL) 30 MG capsule Take 1 capsule (30 mg total) by mouth at bedtime as needed. For insomnia  30 capsule  5  . tiotropium (SPIRIVA) 18 MCG inhalation capsule Place 1 capsule (18 mcg total) into inhaler and inhale daily.  30 capsule    . gemfibrozil (LOPID) 600 MG tablet Take 600 mg by mouth 2 (two) times daily before a meal.      . nicotine (NICODERM CQ -  DOSED IN MG/24 HOURS) 14 mg/24hr patch Place 1 patch onto the skin daily.  28 patch      No Known Allergies   Current Medications, Allergies, Past Medical History, Past Surgical History, Family History, and Social History were reviewed in [employer] record.    Review of Systems        Constitutional:   No  weight loss, night sweats,  Fevers, chills,  +fatigue, or  lassitude.  HEENT:   No headaches,  Difficulty swallowing,  Tooth/dental problems, or  Sore throat,                No sneezing, itching, ear ache, nasal congestion, post nasal drip,   CV:  No chest pain,  Orthopnea, PND, swelling in lower extremities, anasarca, dizziness, palpitations, syncope.   GI  No heartburn, indigestion, abdominal pain, nausea, vomiting, diarrhea, change in bowel habits, loss of appetite, bloody stools.   Resp:  No coughing up of blood.     No chest wall deformity  Skin: no rash or lesions.  GU: no dysuria, change in color of urine, no urgency or frequency.  No flank pain, no hematuria   MS:  No joint pain or swelling.  No decreased range of motion.  No back pain.  Psych:  No change in mood or affect. No depression or anxiety.  No memory loss.       Objective:   Physical Exam      WD, WN, 71 y/o WM in NAD.Marland Kitchen. -no shirt on today, has track suit jacket draped over shoulders.  GENERAL:  Alert & oriented; pleasant & cooperative... HEENT:  EOM-wnl,  EACs-clear, TMs-wnl, NOSE-clear, THROAT-clear & wnl. NECK:  Supple w/ fairROM; no JVD; normal carotid impulses w/o bruits; no thyromegaly or nodules palpated; no lymphadenopathy. CHEST:  Clear to P & A x scat rhonchi, no wheezing, no rales, no signs of consolidation... HEART:  Regular Rhythm; without murmurs/ rubs/ or  gallops heard... ABDOMEN:  Obese, soft & nontender; normal bowel sounds; no organomegaly or masses detected. EXT: without deformities, mild arthritic changes; no varicose veins/ venous insuffic/ or edema. Left  shoulder in sling NEURO:  no focal neuro deficits  DERM:  No lesions noted; no rash etc...  RADIOLOGY DATA:  Reviewed in the EPIC EMR & discussed w/ the patient...  LABORATORY DATA:  Reviewed in the EPIC EMR & discussed w/ the patient...   Assessment & Plan:   COPD/ Smoker>  He has no interest in quitting smoking & refuses smoking cessation help; I carefully explained the risks & he is aware; we rechecked his PFT today==> mod airflow obstruction & signif worse than prev studies in 2006; rec to quit all smoking but he declines offers to help w/ smoking cessation...  HBP>  Controlled on meds, continue same & reminded to quit the Etoh, no salt, take meds daily...  HYPERLIPID>  On Gemfib & low fat diet; labs reviewed w/ pt...  Borderline DM>  Sugars have remained wnl on diet rx alone...  GI> GERD, Divertics, Polyps>  Follow up colonoscopy 10/12 showed +tubular adenomas & repeat planned in another 5 yrs...  Elev PSA>  This improved after antibiotic rx to normal...  Chronic Persistant Insomnia> on Temazepam which helps a lot he says...   Patient's Medications  New Prescriptions   No medications on file  Previous Medications   ALPRAZOLAM (XANAX) 0.25 MG TABLET    Take 1 tablet (0.25 mg total) by mouth 2 (two) times daily.   ATENOLOL (TENORMIN) 50 MG TABLET    Take 50 mg by mouth daily.   FLUTICASONE-SALMETEROL (ADVAIR) 250-50 MCG/DOSE AEPB    Inhale 1 puff into the lungs 2 (two) times daily.   FOLIC ACID (FOLVITE) 1 MG TABLET    Take 1 mg by mouth daily.   GEMFIBROZIL (LOPID) 600 MG TABLET    Take 600 mg by mouth 2 (two) times daily before a meal.   GUAIFENESIN (MUCINEX) 600 MG 12 HR TABLET    Take 1 tablet (600 mg total) by mouth 2 (two) times daily as needed for congestion.   NICOTINE (NICODERM CQ - DOSED IN MG/24 HOURS) 14 MG/24HR PATCH    Place 1 patch onto the skin daily.   RAMIPRIL (ALTACE) 5 MG CAPSULE    Take 5 mg by mouth at bedtime.   TEMAZEPAM (RESTORIL) 30 MG CAPSULE     Take 1 capsule (30 mg total) by mouth at bedtime as needed. For insomnia   TIOTROPIUM (SPIRIVA) 18 MCG INHALATION CAPSULE    Place 1 capsule (18 mcg total) into inhaler and inhale daily.  Modified Medications   No medications on file  Discontinued Medications   No medications on file

## 2012-01-10 NOTE — Patient Instructions (Signed)
Continue on current regimen.  Follow up orthopedics as planned tomorrow.  Keep legs elevated.  Low salt diet.  Work on not smoking.  Please contact office for sooner follow up if symptoms do not improve or worsen or seek emergency care  follow up Dr. Kriste Basque  As planned and As needed

## 2012-01-10 NOTE — Assessment & Plan Note (Signed)
Recent flare with hospitalization now resolved. Patient is to follow up with orthopedics tomorrow as planned for his recent left shoulder fracture. Patient will continue on Advair and Spiriva. Patient has been encouraged on smoking cessation. He'll follow back up with Dr. Kriste Basque as planned in 4 months and as needed.

## 2012-01-13 ENCOUNTER — Telehealth: Payer: Self-pay | Admitting: Pulmonary Disease

## 2012-01-13 MED ORDER — FUROSEMIDE 20 MG PO TABS: 20.0000 mg | ORAL_TABLET | Freq: Every day | ORAL | Status: DC

## 2012-01-13 NOTE — Telephone Encounter (Signed)
Attempted to call nurse-no answer-will send to Leigh to have SN advise if any changes or meds needs to be given. Phone note states this is only an Financial planner.

## 2012-01-13 NOTE — Telephone Encounter (Signed)
Per SN---recs to start pt on lasix 20 mg   1 po every morning as needed for edema.  Called and spoke with becky and pt and they are both aware of SN recs and that the rx for the lasix has been sent to the pts pharmacy.

## 2012-01-13 NOTE — Telephone Encounter (Signed)
Nurse called back to let us know that pt is having edema in legs-very tight in skin; would like recs as pt is not on diuretic.

## 2012-02-11 ENCOUNTER — Telehealth: Payer: Self-pay | Admitting: Pulmonary Disease

## 2012-02-11 MED ORDER — FUROSEMIDE 20 MG PO TABS: 20.0000 mg | ORAL_TABLET | Freq: Every day | ORAL | Status: DC

## 2012-02-11 NOTE — Telephone Encounter (Signed)
90 day supply sent to [facility] and lmom for pt this has been done

## 2012-05-24 ENCOUNTER — Ambulatory Visit: Payer: Medicare Other | Admitting: Pulmonary Disease

## 2012-06-13 ENCOUNTER — Telehealth: Payer: Self-pay | Admitting: Pulmonary Disease

## 2012-06-13 NOTE — Telephone Encounter (Signed)
I do not see anything mentioned in chart. Pt is scheduled to see SN 06/15/12 for "follow up per leigh". Please advise SN thanks

## 2012-06-14 ENCOUNTER — Other Ambulatory Visit: Payer: Self-pay | Admitting: Pulmonary Disease

## 2012-06-14 DIAGNOSIS — E119 Type 2 diabetes mellitus without complications: Secondary | ICD-10-CM

## 2012-06-14 DIAGNOSIS — D126 Benign neoplasm of colon, unspecified: Secondary | ICD-10-CM

## 2012-06-14 DIAGNOSIS — E785 Hyperlipidemia, unspecified: Secondary | ICD-10-CM

## 2012-06-14 NOTE — Telephone Encounter (Signed)
Per SN---had fasting labs on 11/2011---can come in for a1c, bmp and hepat level. i have placed these orders for the pt. thanks

## 2012-06-14 NOTE — Telephone Encounter (Signed)
Pt aware labs placed in epic. Nothing further was needed

## 2012-06-15 ENCOUNTER — Encounter: Payer: Self-pay | Admitting: Pulmonary Disease

## 2012-06-15 ENCOUNTER — Ambulatory Visit (INDEPENDENT_AMBULATORY_CARE_PROVIDER_SITE_OTHER): Payer: Medicare Other | Admitting: Pulmonary Disease

## 2012-06-15 VITALS — BP 144/80 | HR 45 | Temp 98.2°F | Ht 70.5 in | Wt 162.0 lb

## 2012-06-15 DIAGNOSIS — F17200 Nicotine dependence, unspecified, uncomplicated: Secondary | ICD-10-CM

## 2012-06-15 DIAGNOSIS — J4489 Other specified chronic obstructive pulmonary disease: Secondary | ICD-10-CM

## 2012-06-15 DIAGNOSIS — I10 Essential (primary) hypertension: Secondary | ICD-10-CM

## 2012-06-15 DIAGNOSIS — E119 Type 2 diabetes mellitus without complications: Secondary | ICD-10-CM

## 2012-06-15 DIAGNOSIS — E785 Hyperlipidemia, unspecified: Secondary | ICD-10-CM

## 2012-06-15 DIAGNOSIS — K219 Gastro-esophageal reflux disease without esophagitis: Secondary | ICD-10-CM

## 2012-06-15 DIAGNOSIS — K5730 Diverticulosis of large intestine without perforation or abscess without bleeding: Secondary | ICD-10-CM

## 2012-06-15 DIAGNOSIS — Z23 Encounter for immunization: Secondary | ICD-10-CM

## 2012-06-15 DIAGNOSIS — D126 Benign neoplasm of colon, unspecified: Secondary | ICD-10-CM

## 2012-06-15 DIAGNOSIS — J449 Chronic obstructive pulmonary disease, unspecified: Secondary | ICD-10-CM

## 2012-06-15 DIAGNOSIS — G4700 Insomnia, unspecified: Secondary | ICD-10-CM
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
## 2012-06-15 NOTE — Patient Instructions (Addendum)
Today we updated your med list in our EPIC system...    Continue your current medications the same...    We refilled your meds per request...  Today we gave you the combination TETANUS vaccine called the TDAP (it should be good for 10 yrs)...  We also did your follow up blood workmtoday...    We will call you w/ the results when avail...  Call for any problems...  Let's plan a follow up visit in 6 months.Marland KitchenMarland Kitchen

## 2012-07-04 ENCOUNTER — Encounter (HOSPITAL_COMMUNITY): Payer: Self-pay | Admitting: *Deleted

## 2012-07-04 ENCOUNTER — Emergency Department (HOSPITAL_COMMUNITY): Payer: Medicare Other

## 2012-07-04 ENCOUNTER — Inpatient Hospital Stay (HOSPITAL_COMMUNITY)
Admission: EM | Admit: 2012-07-04 | Discharge: 2012-07-07 | DRG: 683 | Disposition: A | Discharge status: Skilled Nursing Facility | Payer: Medicare Other | Attending: Emergency Medicine | Admitting: Emergency Medicine

## 2012-07-04 DIAGNOSIS — D696 Thrombocytopenia, unspecified: Secondary | ICD-10-CM | POA: Diagnosis present

## 2012-07-04 DIAGNOSIS — I10 Essential (primary) hypertension: Secondary | ICD-10-CM

## 2012-07-04 DIAGNOSIS — K219 Gastro-esophageal reflux disease without esophagitis: Secondary | ICD-10-CM

## 2012-07-04 DIAGNOSIS — N19 Unspecified kidney failure: Secondary | ICD-10-CM

## 2012-07-04 DIAGNOSIS — Y92009 Unspecified place in unspecified non-institutional (private) residence as the place of occurrence of the external cause: Secondary | ICD-10-CM

## 2012-07-04 DIAGNOSIS — F29 Unspecified psychosis not due to a substance or known physiological condition: Secondary | ICD-10-CM | POA: Diagnosis present

## 2012-07-04 DIAGNOSIS — N179 Acute kidney failure, unspecified: Secondary | ICD-10-CM

## 2012-07-04 DIAGNOSIS — D509 Iron deficiency anemia, unspecified: Secondary | ICD-10-CM | POA: Diagnosis present

## 2012-07-04 DIAGNOSIS — F17200 Nicotine dependence, unspecified, uncomplicated: Secondary | ICD-10-CM

## 2012-07-04 DIAGNOSIS — J441 Chronic obstructive pulmonary disease with (acute) exacerbation: Secondary | ICD-10-CM

## 2012-07-04 DIAGNOSIS — G4700 Insomnia, unspecified: Secondary | ICD-10-CM

## 2012-07-04 DIAGNOSIS — F1010 Alcohol abuse, uncomplicated: Secondary | ICD-10-CM

## 2012-07-04 DIAGNOSIS — F1020 Alcohol dependence, uncomplicated: Secondary | ICD-10-CM | POA: Diagnosis present

## 2012-07-04 DIAGNOSIS — E119 Type 2 diabetes mellitus without complications: Secondary | ICD-10-CM

## 2012-07-04 DIAGNOSIS — S42209A Unspecified fracture of upper end of unspecified humerus, initial encounter for closed fracture: Secondary | ICD-10-CM | POA: Diagnosis present

## 2012-07-04 DIAGNOSIS — S42302A Unspecified fracture of shaft of humerus, left arm, initial encounter for closed fracture: Secondary | ICD-10-CM

## 2012-07-04 DIAGNOSIS — K5730 Diverticulosis of large intestine without perforation or abscess without bleeding: Secondary | ICD-10-CM

## 2012-07-04 DIAGNOSIS — S42309A Unspecified fracture of shaft of humerus, unspecified arm, initial encounter for closed fracture: Secondary | ICD-10-CM

## 2012-07-04 DIAGNOSIS — J4489 Other specified chronic obstructive pulmonary disease: Secondary | ICD-10-CM

## 2012-07-04 DIAGNOSIS — R410 Disorientation, unspecified: Secondary | ICD-10-CM

## 2012-07-04 DIAGNOSIS — E785 Hyperlipidemia, unspecified: Secondary | ICD-10-CM

## 2012-07-04 DIAGNOSIS — J984 Other disorders of lung: Secondary | ICD-10-CM

## 2012-07-04 DIAGNOSIS — W19XXXA Unspecified fall, initial encounter: Secondary | ICD-10-CM

## 2012-07-04 DIAGNOSIS — D126 Benign neoplasm of colon, unspecified: Secondary | ICD-10-CM

## 2012-07-04 DIAGNOSIS — R4182 Altered mental status, unspecified: Secondary | ICD-10-CM

## 2012-07-04 DIAGNOSIS — J449 Chronic obstructive pulmonary disease, unspecified: Secondary | ICD-10-CM

## 2012-07-04 DIAGNOSIS — R972 Elevated prostate specific antigen [PSA]: Secondary | ICD-10-CM

## 2012-07-04 HISTORY — DX: Shortness of breath: R06.02

## 2012-07-04 HISTORY — DX: Alcohol abuse, uncomplicated: F10.10

## 2012-07-04 LAB — CBC WITH DIFFERENTIAL/PLATELET
Eosinophils Absolute: 0 10*3/uL (ref 0.0–0.7)
Eosinophils Relative: 0 % (ref 0–5)
HCT: 42.2 % (ref 39.0–52.0)
Hemoglobin: 14.9 g/dL (ref 13.0–17.0)
Lymphocytes Relative: 6 % — ABNORMAL LOW (ref 12–46)
Lymphs Abs: 0.7 10*3/uL (ref 0.7–4.0)
MCH: 35.6 pg — ABNORMAL HIGH (ref 26.0–34.0)
MCV: 101 fL — ABNORMAL HIGH (ref 78.0–100.0)
Monocytes Absolute: 1.5 10*3/uL — ABNORMAL HIGH (ref 0.1–1.0)
Monocytes Relative: 12 % (ref 3–12)
Platelets: 129 10*3/uL — ABNORMAL LOW (ref 150–400)
RBC: 4.18 MIL/uL — ABNORMAL LOW (ref 4.22–5.81)
WBC: 12.1 10*3/uL — ABNORMAL HIGH (ref 4.0–10.5)

## 2012-07-04 LAB — RAPID URINE DRUG SCREEN, HOSP PERFORMED
Barbiturates: NOT DETECTED
Opiates: NOT DETECTED
Tetrahydrocannabinol: NOT DETECTED

## 2012-07-04 LAB — COMPREHENSIVE METABOLIC PANEL
ALT: 35 U/L (ref 0–53)
BUN: 88 mg/dL — ABNORMAL HIGH (ref 6–23)
CO2: 21 mEq/L (ref 19–32)
Calcium: 9.5 mg/dL (ref 8.4–10.5)
GFR calc Af Amer: 37 mL/min — ABNORMAL LOW (ref >90)
GFR calc non Af Amer: 32 mL/min — ABNORMAL LOW (ref >90)
Glucose, Bld: 125 mg/dL — ABNORMAL HIGH (ref 70–99)
Sodium: 144 mEq/L (ref 135–145)
Total Protein: 6.7 g/dL (ref 6.0–8.3)

## 2012-07-04 LAB — CK: Total CK: 516 U/L — ABNORMAL HIGH (ref 7–232)

## 2012-07-04 MED ORDER — OXYCODONE HCL 5 MG PO TABS
5.0000 mg | ORAL_TABLET | ORAL | Status: DC | PRN
  Administered 2012-07-05 – 2012-07-06 (×3): 5 mg via ORAL
  Filled 2012-07-04 (×3): qty 1

## 2012-07-04 MED ORDER — SODIUM CHLORIDE 0.9 % IV BOLUS (SEPSIS)
1000.0000 mL | Freq: Once | INTRAVENOUS | Status: AC
  Administered 2012-07-04: 1000 mL via INTRAVENOUS

## 2012-07-04 MED ORDER — GEMFIBROZIL 600 MG PO TABS
600.0000 mg | ORAL_TABLET | Freq: Two times a day (BID) | ORAL | Status: DC
  Administered 2012-07-05 – 2012-07-07 (×6): 600 mg via ORAL
  Filled 2012-07-04 (×7): qty 1

## 2012-07-04 MED ORDER — ONDANSETRON HCL 4 MG PO TABS: 4.0000 mg | ORAL_TABLET | Freq: Four times a day (QID) | ORAL | Status: DC | PRN

## 2012-07-04 MED ORDER — MORPHINE SULFATE 2 MG/ML IJ SOLN
2.0000 mg | INTRAMUSCULAR | Status: DC | PRN
  Administered 2012-07-05: 2 mg via INTRAVENOUS
  Filled 2012-07-04: qty 1

## 2012-07-04 MED ORDER — ACETAMINOPHEN 325 MG PO TABS: 650.0000 mg | ORAL_TABLET | Freq: Four times a day (QID) | ORAL | Status: DC | PRN

## 2012-07-04 MED ORDER — TRAZODONE HCL 50 MG PO TABS
50.0000 mg | ORAL_TABLET | Freq: Every evening | ORAL | Status: DC | PRN
  Filled 2012-07-04: qty 1

## 2012-07-04 MED ORDER — POLYETHYLENE GLYCOL 3350 17 G PO PACK: 17.0000 g | PACK | Freq: Every day | ORAL | Status: DC | PRN

## 2012-07-04 MED ORDER — SODIUM CHLORIDE 0.9 % IV SOLN
INTRAVENOUS | Status: DC
  Administered 2012-07-05 – 2012-07-06 (×3): via INTRAVENOUS
  Administered 2012-07-07: 100 mL/h via INTRAVENOUS

## 2012-07-04 MED ORDER — ONDANSETRON HCL 4 MG/2ML IJ SOLN: 4.0000 mg | Freq: Four times a day (QID) | INTRAMUSCULAR | Status: DC | PRN

## 2012-07-04 MED ORDER — FENTANYL CITRATE 0.05 MG/ML IJ SOLN
50.0000 ug | Freq: Once | INTRAMUSCULAR | Status: AC
  Administered 2012-07-04: 50 ug via INTRAVENOUS

## 2012-07-04 MED ORDER — FENTANYL CITRATE 0.05 MG/ML IJ SOLN
50.0000 ug | Freq: Once | INTRAMUSCULAR | Status: AC
  Administered 2012-07-04: 50 ug via INTRAVENOUS
  Filled 2012-07-04: qty 2

## 2012-07-04 MED ORDER — SODIUM CHLORIDE 0.9 % IV SOLN
1000.0000 mL | Freq: Once | INTRAVENOUS | Status: AC
  Administered 2012-07-04: 1000 mL via INTRAVENOUS

## 2012-07-04 MED ORDER — ACETAMINOPHEN 650 MG RE SUPP: 650.0000 mg | Freq: Four times a day (QID) | RECTAL | Status: DC | PRN

## 2012-07-04 MED ORDER — SODIUM CHLORIDE 0.9 % IV SOLN
1000.0000 mL | INTRAVENOUS | Status: DC
  Administered 2012-07-04: 1000 mL via INTRAVENOUS
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
## 2012-07-04 NOTE — H&P (Signed)
[facility] History and Physical  Blake [name]:[id number] DOB: [birth year] DOA: 07/04/2012  Referring physician: Daleen Bo, MD supervised by Dr. Patria Mane  PCP: Michele Mcalpine, MD   Chief Complaint: Left humeral fracture  HPI: Blake [name] is a 71 y.o. male with past medical history of COPD, tobacco abuse and a alcohol abuse. Patient brought in to the hospital because of left humeral fracture. The patient is confused, per ED physician notes he is an avid alcohol drinker and he is confabulating. The history was obtained from the ED physician notes. Apparently patient fell at home with some point, and broke his left humerus. Patient was not able to get up after that and he was yelling. At some point his neighbor heard him and called 911. Is unknown for how long patient was on the floor. Upon initial evaluation in the emergency department x-rays showed left humeral fracture, has acute renal failure with creatinine of 1.99. Patient to be admitted to the hospital for further evaluation.  Review of Systems:  Review of system was not obtained because of patient confusion.  Past Medical History  Diagnosis Date  . Pulmonary nodule   . COPD (chronic obstructive pulmonary disease)   . Cigarette smoker   . Hypertension   . Hyperlipidemia   . GERD (gastroesophageal reflux disease)   . Diverticulosis of colon   . Hx of colonic polyps   . Elevated prostate specific antigen (PSA)   . Chronic insomnia   . Diabetes mellitus     no per pt, no meds taken  . ETOH abuse    Past Surgical History  Procedure Date  . Vasectomy   . Needle biopsy rul nodule 01/2009    benign  . Tonsillectomy 1947  . Colonoscopy    Social History:  reports that he has been smoking Cigarettes.  He has a 55 pack-year smoking history. He has never used smokeless tobacco. He reports that he drinks about 14.4 ounces of alcohol per week. He reports that he does not use illicit drugs. According to the notes she lives  alone at home.  No Known Allergies  Family History  Problem Relation Age of Onset  . Lung cancer Brother   . Diabetes Father   . Colon cancer Neg Hx   . Esophageal cancer Neg Hx   . Stomach cancer Neg Hx     Prior to Admission medications   Medication Sig Start Date End Date Taking? Authorizing Provider  atenolol (TENORMIN) 50 MG tablet Take 50 mg by mouth daily. 05/10/11 07/15/12 Yes Michele Mcalpine, MD  gemfibrozil (LOPID) 600 MG tablet Take 600 mg by mouth 2 (two) times daily before a meal. 05/10/11 07/15/12 Yes Michele Mcalpine, MD  OVER THE COUNTER MEDICATION Take 1-2 tablets by mouth at bedtime as needed. Sleep aid for insomnia will take 1 tablet then a second one if needed.   Yes Historical Provider, MD  ramipril (ALTACE) 5 MG capsule Take 5 mg by mouth at bedtime. 05/10/11 07/15/12 Yes Michele Mcalpine, MD  temazepam (RESTORIL) 30 MG capsule Take 30 mg by mouth at bedtime as needed. For insomnia 12/27/11 12/26/12 Yes Michele Mcalpine, MD   Physical Exam: Filed Vitals:   07/04/12 1210 07/04/12 1230 07/04/12 1300 07/04/12 1400  BP: 129/81 128/73 116/65 138/72  Pulse:  85 88 92  Temp:      TempSrc:      Resp: 18 19 20 16   SpO2: 99% 96% 97% 99%  General appearance: alert, cooperative and no distress  Head: Normocephalic, without obvious abnormality, atraumatic  Eyes: conjunctivae/corneas clear. PERRL, EOM's intact. Fundi benign.  Nose: Nares normal. Septum midline. Mucosa normal. No drainage or sinus tenderness.  Throat: lips, mucosa, and tongue normal; teeth and gums normal  Neck: Supple, no masses, no cervical lymphadenopathy, no JVD appreciated, no meningeal signs Resp: clear to auscultation bilaterally  Chest wall: no tenderness  Cardio: regular rate and rhythm, S1, S2 normal, no murmur, click, rub or gallop  GI: soft, non-tender; bowel sounds normal; no masses, no organomegaly  Extremities: There is swelling and bruises on around his left shoulder Skin: Skin color, texture, turgor  normal. No rashes or lesions   Neurologic: Alert and disoriented. The rest of the neurological examination was not done because of patient confusion Labs on Admission:  Basic Metabolic Panel:  Lab 07/04/12 2841  NA 144  K 4.2  CL 105  CO2 21  GLUCOSE 125*  BUN 88*  CREATININE 1.99*  CALCIUM 9.5  MG --  PHOS --   Liver Function Tests:  Lab 07/04/12 1029  AST 56*  ALT 35  ALKPHOS 69  BILITOT 2.0*  PROT 6.7  ALBUMIN 3.5   No results found for this basename: LIPASE:5,AMYLASE:5 in the last 168 hours No results found for this basename: AMMONIA:5 in the last 168 hours CBC:  Lab 07/04/12 1029  WBC 12.1*  NEUTROABS 9.9*  HGB 14.9  HCT 42.2  MCV 101.0*  PLT 129*   Cardiac Enzymes:  Lab 07/04/12 1029  CKTOTAL 516*  CKMB --  CKMBINDEX --  TROPONINI --    BNP (last 3 results) No results found for this basename: PROBNP:3 in the last 8760 hours CBG: No results found for this basename: GLUCAP:5 in the last 168 hours  Radiological Exams on Admission: Dg Elbow 2 Views Left  07/04/2012  *RADIOLOGY REPORT*  Clinical Data: Fall.  Combative.  LEFT HUMERUS - 2+ VIEW,LEFT ELBOW - 2 VIEW,LEFT FOREARM - 2 VIEW  Comparison: 12/09/2011 left humerus.  Findings: The patient was not cooperative.  A single portable view of the left humerus, elbow and forearm was able to be obtained. This limits evaluation for subtle injury.  In this patient who has had a previous comminuted fracture of the proximal left humeral shaft/surgical neck region, there is evidence of an acute comminuted fracture with marked separation of fracture fragments and overriding of the major fracture fragments.  No other obvious fracture noted.  IMPRESSION: The patient was not cooperative.  A single portable view of the left humerus, elbow and forearm was able to be obtained.  This limits evaluation for subtle injury.  In this patient who has had a previous comminuted fracture of the proximal left humeral shaft/surgical neck  region, there is evidence of an acute comminuted fracture with marked separation of fracture fragments and overriding of the major fracture fragments.  No other obvious fracture noted.   Original Report Authenticated By: Fuller Canada, M.D.    Dg Forearm Left  07/04/2012  *RADIOLOGY REPORT*  Clinical Data: Fall.  Combative.  LEFT HUMERUS - 2+ VIEW,LEFT ELBOW - 2 VIEW,LEFT FOREARM - 2 VIEW  Comparison: 12/09/2011 left humerus.  Findings: The patient was not cooperative.  A single portable view of the left humerus, elbow and forearm was able to be obtained. This limits evaluation for subtle injury.  In this patient who has had a previous comminuted fracture of the proximal left humeral shaft/surgical neck region, there is evidence  of an acute comminuted fracture with marked separation of fracture fragments and overriding of the major fracture fragments.  No other obvious fracture noted.  IMPRESSION: The patient was not cooperative.  A single portable view of the left humerus, elbow and forearm was able to be obtained.  This limits evaluation for subtle injury.  In this patient who has had a previous comminuted fracture of the proximal left humeral shaft/surgical neck region, there is evidence of an acute comminuted fracture with marked separation of fracture fragments and overriding of the major fracture fragments.  No other obvious fracture noted.   Original Report Authenticated By: Fuller Canada, M.D.    Ct Head Wo Contrast  07/04/2012  *RADIOLOGY REPORT*  Clinical Data:  Fall.  CT HEAD WITHOUT CONTRAST CT CERVICAL SPINE WITHOUT CONTRAST  Technique:  Multidetector CT imaging of the head and cervical spine was performed following the standard protocol without intravenous contrast.  Multiplanar CT image reconstructions of the cervical spine were also generated.  Comparison:  05/23/2009.  CT HEAD  Findings: Prior orbital fracture with surgery.  No acute skull fracture.  No intracranial hemorrhage.  Global  atrophy.  Ventricles slightly more prominent than on the prior examination which may be related to atrophy rather than hydrocephalus.  Small vessel disease type changes without CT evidence of large acute infarct.  No intracranial mass lesion detected on this unenhanced exam.  IMPRESSION: No acute skull fracture or intracranial hemorrhage.  Global atrophy.  Ventricular prominence more notable than on the prior exam which may be related to atrophy rather hydrocephalus.  Small vessel disease type changes without CT evidence of large acute infarct.  CT CERVICAL SPINE  Findings: No cervical spine fracture or malalignment.  Cervical spondylotic changes with spinal stenosis most notable C3- 4, C4-5 and C5-6.  Focal ossification of the posterior longitudinal ligament posterior to the C6 vertebra contributes to spinal stenosis centrally and is without change.  Left sided C6-7 disc protrusion left sided spinal stenosis.  1 cm left thyroid lesion.  This can be followed with thyroid ultrasound.  Carotid bifurcation calcifications.  Slight increase in size of slightly septated fatty lesion within the posterior neck soft tissue at the C4-5 level measuring 4.6 x 2.9 x 2.3 cm.  This can be followed with contrast enhanced soft tissue MRI on elective basis.  IMPRESSION: No cervical spine fracture or malalignment.  Cervical spondylotic changes with spinal stenosis as noted above.  1 cm left thyroid lesion.  This can be followed with thyroid ultrasound.  Slight increase in size of slightly septated fatty lesion within the posterior neck soft tissue at the C4-5 level measuring 4.6 x 2.9 x 2.3 cm.  This can be followed with contrast enhanced soft tissue MRI on elective basis.   Original Report Authenticated By: Fuller Canada, M.D.    Ct Cervical Spine Wo Contrast  07/04/2012  *RADIOLOGY REPORT*  Clinical Data:  Fall.  CT HEAD WITHOUT CONTRAST CT CERVICAL SPINE WITHOUT CONTRAST  Technique:  Multidetector CT imaging of the head and  cervical spine was performed following the standard protocol without intravenous contrast.  Multiplanar CT image reconstructions of the cervical spine were also generated.  Comparison:  05/23/2009.  CT HEAD  Findings: Prior orbital fracture with surgery.  No acute skull fracture.  No intracranial hemorrhage.  Global atrophy.  Ventricles slightly more prominent than on the prior examination which may be related to atrophy rather than hydrocephalus.  Small vessel disease type changes without CT evidence of  large acute infarct.  No intracranial mass lesion detected on this unenhanced exam.  IMPRESSION: No acute skull fracture or intracranial hemorrhage.  Global atrophy.  Ventricular prominence more notable than on the prior exam which may be related to atrophy rather hydrocephalus.  Small vessel disease type changes without CT evidence of large acute infarct.  CT CERVICAL SPINE  Findings: No cervical spine fracture or malalignment.  Cervical spondylotic changes with spinal stenosis most notable C3- 4, C4-5 and C5-6.  Focal ossification of the posterior longitudinal ligament posterior to the C6 vertebra contributes to spinal stenosis centrally and is without change.  Left sided C6-7 disc protrusion left sided spinal stenosis.  1 cm left thyroid lesion.  This can be followed with thyroid ultrasound.  Carotid bifurcation calcifications.  Slight increase in size of slightly septated fatty lesion within the posterior neck soft tissue at the C4-5 level measuring 4.6 x 2.9 x 2.3 cm.  This can be followed with contrast enhanced soft tissue MRI on elective basis.  IMPRESSION: No cervical spine fracture or malalignment.  Cervical spondylotic changes with spinal stenosis as noted above.  1 cm left thyroid lesion.  This can be followed with thyroid ultrasound.  Slight increase in size of slightly septated fatty lesion within the posterior neck soft tissue at the C4-5 level measuring 4.6 x 2.9 x 2.3 cm.  This can be followed with  contrast enhanced soft tissue MRI on elective basis.   Original Report Authenticated By: Fuller Canada, M.D.    Dg Chest Portable 1 View  07/04/2012  *RADIOLOGY REPORT*  Clinical Data: Altered mental status, fall, history COPD, smoking, hypertension, diabetes  PORTABLE CHEST - 1 VIEW  Comparison: Portable exam 1021 hours compared to 12/10/2011  Findings: Normal heart size, mediastinal contours, and pulmonary vascularity. Numerous cardiac monitoring leads project over chest. Lungs grossly clear. No pleural effusion or pneumothorax. Old healed bilateral inferior rib fractures. Osseous demineralization. Age indeterminate fracture at a lateral inferior right rib.  IMPRESSION: Multiple old bilateral rib fractures with an age indeterminate fracture a lateral inferior right rib, approximately eighth. No acute pulmonary abnormalities.   Original Report Authenticated By: Lollie Marrow, M.D.    Dg Humerus Left  07/04/2012  *RADIOLOGY REPORT*  Clinical Data: Fall.  Combative.  LEFT HUMERUS - 2+ VIEW,LEFT ELBOW - 2 VIEW,LEFT FOREARM - 2 VIEW  Comparison: 12/09/2011 left humerus.  Findings: The patient was not cooperative.  A single portable view of the left humerus, elbow and forearm was able to be obtained. This limits evaluation for subtle injury.  In this patient who has had a previous comminuted fracture of the proximal left humeral shaft/surgical neck region, there is evidence of an acute comminuted fracture with marked separation of fracture fragments and overriding of the major fracture fragments.  No other obvious fracture noted.  IMPRESSION: The patient was not cooperative.  A single portable view of the left humerus, elbow and forearm was able to be obtained.  This limits evaluation for subtle injury.  In this patient who has had a previous comminuted fracture of the proximal left humeral shaft/surgical neck region, there is evidence of an acute comminuted fracture with marked separation of fracture fragments  and overriding of the major fracture fragments.  No other obvious fracture noted.   Original Report Authenticated By: Fuller Canada, M.D.     EKG: Independently reviewed. Shows questionable atrial activity, repeat ECG  Assessment/Plan Active Problems:  ETOH abuse  Fall  Left humeral fracture  ARF (  acute renal failure)  Confusion   Left humeral fracture -Per ED, orthopedic surgery service has been called, waiting for them to evaluate him. -Meanwhile keep patient n.p.o., manage his pain with narcotics. Keep his arm on splint  Acute renal failure -Likely secondary to dehydration and poor oral intake from laying on the floor for a long time. -Total CPK is 516, no concern about rhabdomyolysis. -Hydrated aggressively with IV fluids, check BMP in the morning.  Fall -It is been reported that patient has multiple falls from before. -This is might be simply secondary to his drinking problems. -Heart rate in the emergency department was in the 50s an EKG showed questionable atrial activity. -I will place patient on telemetry, 12-lead EKG, rule out acute coronary syndrome by cycling 3 sets of cardiac enzymes.  Confusion -Patient is confused and rather confabulating. -This is might be related to his this is probably related to his alcohol abuse. -CT scan is negative for intracranial abnormalities. I'll check B12 and folate.   Alcohol abuse -Slight elevation of AST of 56. There is mild thrombocytopenia too. -I will start thiamine and folate as well as multivitamins. -Blood alcohol level is undetectable, we will watch for alcohol withdrawal. -If patient exhibits any symptoms of I.: Controlled we will start CWA protocol.   Preoperative cardiac risk stratification -If orthopedics decides to surgically fix the fracture patient can go to surgery without further testing or workup. -He is moderate risk, unknown functional status. He does not have CAD, DM 2, PVD, CKD or history of CVA.  Code  status: Presumed to be full code  Family Communication:   Disposition Plan:  inpatient   Time spent:  70 minutes   [facility] Pager [phone]  If 7PM-7AM, please contact night-coverage [email or web address] Password [email or web address] 07/04/2012, 2:08 PM

## 2012-07-04 NOTE — Consult Note (Signed)
Reason for Consult: Left Humerus Fracture  Referring Physician: [facility]   Blake [name] is an 71 y.o. male.  HPI: Blake [name] is a 71 year old male seen in the [facility] ED today for multiple issues.  It is reported in the chart that he had a fall a 'couple of days ago in bathroom and crawled to bedroom. Neighbor came to check on him because he had not seen him in 3 days and pt has significant outward rotation of left arm and shoulder with questionable left humerus deformity with bruising'. He is a patient of Blake [name] and is currently being followed for a left humerus fracture.  Our last office visit was back on 01/18/12 and was in follow up for his left shoulder proximal humerus fracture.  He was supposed to follow back up 4 weeks later for repeat x-rays.  That was the last visit we had on record. Our records indicate that he had a fall back in March of this year whre he sustained the left humerus fracture originally.  He was admitted by the Hospital service and later followed up with [facility] thru the month of April 2013. Upon arrival today, the patient was reported to be confused.  He reported to drinking ETOH daily and tells me today that he has already had at least a 6-pack already.  He states that he falls and will pass out and that's why he is scared to drive. He states that the left arm is painful but is a very poor historian and cannot recall any recent events surrounding a fall.   Past Medical History  Diagnosis Date  . Pulmonary nodule   . COPD (chronic obstructive pulmonary disease)   . Cigarette smoker   . Hypertension   . Hyperlipidemia   . GERD (gastroesophageal reflux disease)   . Diverticulosis of colon   . Hx of colonic polyps   . Elevated prostate specific antigen (PSA)   . Chronic insomnia   . Diabetes mellitus     no per pt, no meds taken  . ETOH abuse     Past Surgical History  Procedure Date  . Vasectomy   . Needle biopsy rul nodule 01/2009   benign  . Tonsillectomy 1947  . Colonoscopy     Family History  Problem Relation Age of Onset  . Lung cancer Brother   . Diabetes Father   . Colon cancer Neg Hx   . Esophageal cancer Neg Hx   . Stomach cancer Neg Hx     Social History:  reports that he has been smoking Cigarettes.  He has a 55 pack-year smoking history. He has never used smokeless tobacco. He reports that he drinks about 14.4 ounces of alcohol per week. He reports that he does not use illicit drugs.  Allergies: No Known Allergies  Medications:  atenolol gemfibrozil   OTC sleep aid ramipril  temazepam    Results for orders placed during the hospital encounter of 07/04/12 (from the past 48 hour(s))  CBC WITH DIFFERENTIAL     Status: Abnormal   Collection Time   07/04/12 10:29 AM      Component Value Range Comment   WBC 12.1 (*) 4.0 - 10.5 K/uL    RBC 4.18 (*) 4.22 - 5.81 MIL/uL    Hemoglobin 14.9  13.0 - 17.0 g/dL    HCT 14.7  82.9 - 56.2 %    MCV 101.0 (*) 78.0 - 100.0 fL    MCH 35.6 (*)  26.0 - 34.0 pg    MCHC 35.3  30.0 - 36.0 g/dL    RDW 16.1  09.6 - 04.5 %    Platelets 129 (*) 150 - 400 K/uL    Neutrophils Relative 82 (*) 43 - 77 %    Neutro Abs 9.9 (*) 1.7 - 7.7 K/uL    Lymphocytes Relative 6 (*) 12 - 46 %    Lymphs Abs 0.7  0.7 - 4.0 K/uL    Monocytes Relative 12  3 - 12 %    Monocytes Absolute 1.5 (*) 0.1 - 1.0 K/uL    Eosinophils Relative 0  0 - 5 %    Eosinophils Absolute 0.0  0.0 - 0.7 K/uL    Basophils Relative 0  0 - 1 %    Basophils Absolute 0.0  0.0 - 0.1 K/uL   COMPREHENSIVE METABOLIC PANEL     Status: Abnormal   Collection Time   07/04/12 10:29 AM      Component Value Range Comment   Sodium 144  135 - 145 mEq/L    Potassium 4.2  3.5 - 5.1 mEq/L    Chloride 105  96 - 112 mEq/L    CO2 21  19 - 32 mEq/L    Glucose, Bld 125 (*) 70 - 99 mg/dL    BUN 88 (*) 6 - 23 mg/dL    Creatinine, Ser 4.09 (*) 0.50 - 1.35 mg/dL    Calcium 9.5  8.4 - 81.1 mg/dL    Total Protein 6.7  6.0 - 8.3  g/dL    Albumin 3.5  3.5 - 5.2 g/dL    AST 56 (*) 0 - 37 U/L    ALT 35  0 - 53 U/L    Alkaline Phosphatase 69  39 - 117 U/L    Total Bilirubin 2.0 (*) 0.3 - 1.2 mg/dL    GFR calc non Af Amer 32 (*) >90 mL/min    GFR calc Af Amer 37 (*) >90 mL/min   CK     Status: Abnormal   Collection Time   07/04/12 10:29 AM      Component Value Range Comment   Total CK 516 (*) 7 - 232 U/L   ETHANOL     Status: Normal   Collection Time   07/04/12 10:29 AM      Component Value Range Comment   Alcohol, Ethyl (B) <11  0 - 11 mg/dL   PROTIME-INR     Status: Normal   Collection Time   07/04/12  1:47 PM      Component Value Range Comment   Prothrombin Time 13.5  11.6 - 15.2 seconds    INR 1.04  0.00 - 1.49     Dg Elbow 2 Views Left  07/04/2012  *RADIOLOGY REPORT*  Clinical Data: Fall.  Combative.  LEFT HUMERUS - 2+ VIEW,LEFT ELBOW - 2 VIEW,LEFT FOREARM - 2 VIEW  Comparison: 12/09/2011 left humerus.  Findings: The patient was not cooperative.  A single portable view of the left humerus, elbow and forearm was able to be obtained. This limits evaluation for subtle injury.  In this patient who has had a previous comminuted fracture of the proximal left humeral shaft/surgical neck region, there is evidence of an acute comminuted fracture with marked separation of fracture fragments and overriding of the major fracture fragments.  No other obvious fracture noted.  IMPRESSION: The patient was not cooperative.  A single portable view of the left humerus, elbow and forearm was able to  be obtained.  This limits evaluation for subtle injury.  In this patient who has had a previous comminuted fracture of the proximal left humeral shaft/surgical neck region, there is evidence of an acute comminuted fracture with marked separation of fracture fragments and overriding of the major fracture fragments.  No other obvious fracture noted.   Original Report Authenticated By: Fuller Canada, M.D.    Dg Forearm Left  07/04/2012   *RADIOLOGY REPORT*  Clinical Data: Fall.  Combative.  LEFT HUMERUS - 2+ VIEW,LEFT ELBOW - 2 VIEW,LEFT FOREARM - 2 VIEW  Comparison: 12/09/2011 left humerus.  Findings: The patient was not cooperative.  A single portable view of the left humerus, elbow and forearm was able to be obtained. This limits evaluation for subtle injury.  In this patient who has had a previous comminuted fracture of the proximal left humeral shaft/surgical neck region, there is evidence of an acute comminuted fracture with marked separation of fracture fragments and overriding of the major fracture fragments.  No other obvious fracture noted.  IMPRESSION: The patient was not cooperative.  A single portable view of the left humerus, elbow and forearm was able to be obtained.  This limits evaluation for subtle injury.  In this patient who has had a previous comminuted fracture of the proximal left humeral shaft/surgical neck region, there is evidence of an acute comminuted fracture with marked separation of fracture fragments and overriding of the major fracture fragments.  No other obvious fracture noted.   Original Report Authenticated By: Fuller Canada, M.D.    Ct Head Wo Contrast  07/04/2012  *RADIOLOGY REPORT*  Clinical Data:  Fall.  CT HEAD WITHOUT CONTRAST CT CERVICAL SPINE WITHOUT CONTRAST  Technique:  Multidetector CT imaging of the head and cervical spine was performed following the standard protocol without intravenous contrast.  Multiplanar CT image reconstructions of the cervical spine were also generated.  Comparison:  05/23/2009.  CT HEAD  Findings: Prior orbital fracture with surgery.  No acute skull fracture.  No intracranial hemorrhage.  Global atrophy.  Ventricles slightly more prominent than on the prior examination which may be related to atrophy rather than hydrocephalus.  Small vessel disease type changes without CT evidence of large acute infarct.  No intracranial mass lesion detected on this unenhanced exam.   IMPRESSION: No acute skull fracture or intracranial hemorrhage.  Global atrophy.  Ventricular prominence more notable than on the prior exam which may be related to atrophy rather hydrocephalus.  Small vessel disease type changes without CT evidence of large acute infarct.  CT CERVICAL SPINE  Findings: No cervical spine fracture or malalignment.  Cervical spondylotic changes with spinal stenosis most notable C3- 4, C4-5 and C5-6.  Focal ossification of the posterior longitudinal ligament posterior to the C6 vertebra contributes to spinal stenosis centrally and is without change.  Left sided C6-7 disc protrusion left sided spinal stenosis.  1 cm left thyroid lesion.  This can be followed with thyroid ultrasound.  Carotid bifurcation calcifications.  Slight increase in size of slightly septated fatty lesion within the posterior neck soft tissue at the C4-5 level measuring 4.6 x 2.9 x 2.3 cm.  This can be followed with contrast enhanced soft tissue MRI on elective basis.  IMPRESSION: No cervical spine fracture or malalignment.  Cervical spondylotic changes with spinal stenosis as noted above.  1 cm left thyroid lesion.  This can be followed with thyroid ultrasound.  Slight increase in size of slightly septated fatty lesion within the posterior neck  soft tissue at the C4-5 level measuring 4.6 x 2.9 x 2.3 cm.  This can be followed with contrast enhanced soft tissue MRI on elective basis.   Original Report Authenticated By: Fuller Canada, M.D.    Ct Cervical Spine Wo Contrast  07/04/2012  *RADIOLOGY REPORT*  Clinical Data:  Fall.  CT HEAD WITHOUT CONTRAST CT CERVICAL SPINE WITHOUT CONTRAST  Technique:  Multidetector CT imaging of the head and cervical spine was performed following the standard protocol without intravenous contrast.  Multiplanar CT image reconstructions of the cervical spine were also generated.  Comparison:  05/23/2009.  CT HEAD  Findings: Prior orbital fracture with surgery.  No acute skull  fracture.  No intracranial hemorrhage.  Global atrophy.  Ventricles slightly more prominent than on the prior examination which may be related to atrophy rather than hydrocephalus.  Small vessel disease type changes without CT evidence of large acute infarct.  No intracranial mass lesion detected on this unenhanced exam.  IMPRESSION: No acute skull fracture or intracranial hemorrhage.  Global atrophy.  Ventricular prominence more notable than on the prior exam which may be related to atrophy rather hydrocephalus.  Small vessel disease type changes without CT evidence of large acute infarct.  CT CERVICAL SPINE  Findings: No cervical spine fracture or malalignment.  Cervical spondylotic changes with spinal stenosis most notable C3- 4, C4-5 and C5-6.  Focal ossification of the posterior longitudinal ligament posterior to the C6 vertebra contributes to spinal stenosis centrally and is without change.  Left sided C6-7 disc protrusion left sided spinal stenosis.  1 cm left thyroid lesion.  This can be followed with thyroid ultrasound.  Carotid bifurcation calcifications.  Slight increase in size of slightly septated fatty lesion within the posterior neck soft tissue at the C4-5 level measuring 4.6 x 2.9 x 2.3 cm.  This can be followed with contrast enhanced soft tissue MRI on elective basis.  IMPRESSION: No cervical spine fracture or malalignment.  Cervical spondylotic changes with spinal stenosis as noted above.  1 cm left thyroid lesion.  This can be followed with thyroid ultrasound.  Slight increase in size of slightly septated fatty lesion within the posterior neck soft tissue at the C4-5 level measuring 4.6 x 2.9 x 2.3 cm.  This can be followed with contrast enhanced soft tissue MRI on elective basis.   Original Report Authenticated By: Fuller Canada, M.D.    Dg Chest Portable 1 View  07/04/2012  *RADIOLOGY REPORT*  Clinical Data: Altered mental status, fall, history COPD, smoking, hypertension, diabetes   PORTABLE CHEST - 1 VIEW  Comparison: Portable exam 1021 hours compared to 12/10/2011  Findings: Normal heart size, mediastinal contours, and pulmonary vascularity. Numerous cardiac monitoring leads project over chest. Lungs grossly clear. No pleural effusion or pneumothorax. Old healed bilateral inferior rib fractures. Osseous demineralization. Age indeterminate fracture at a lateral inferior right rib.  IMPRESSION: Multiple old bilateral rib fractures with an age indeterminate fracture a lateral inferior right rib, approximately eighth. No acute pulmonary abnormalities.   Original Report Authenticated By: Lollie Marrow, M.D.    Dg Humerus Left  07/04/2012  *RADIOLOGY REPORT*  Clinical Data: Fall.  Combative.  LEFT HUMERUS - 2+ VIEW,LEFT ELBOW - 2 VIEW,LEFT FOREARM - 2 VIEW  Comparison: 12/09/2011 left humerus.  Findings: The patient was not cooperative.  A single portable view of the left humerus, elbow and forearm was able to be obtained. This limits evaluation for subtle injury.  In this patient who has had a  previous comminuted fracture of the proximal left humeral shaft/surgical neck region, there is evidence of an acute comminuted fracture with marked separation of fracture fragments and overriding of the major fracture fragments.  No other obvious fracture noted.  IMPRESSION: The patient was not cooperative.  A single portable view of the left humerus, elbow and forearm was able to be obtained.  This limits evaluation for subtle injury.  In this patient who has had a previous comminuted fracture of the proximal left humeral shaft/surgical neck region, there is evidence of an acute comminuted fracture with marked separation of fracture fragments and overriding of the major fracture fragments.  No other obvious fracture noted.   Original Report Authenticated By: Fuller Canada, M.D.     ROS - Unable to obtain a complete ROS due to his confusion.  Blood pressure 138/72, pulse 92, temperature 97.6 F  (36.4 C), temperature source Oral, resp. rate 16, SpO2 99.00%. Physical Exam General - Patient is a 71 year old male seen in [facility] ED.  Alert but poor historian. Left Upper Extremity - +2 Radial and Ulnar Pulses  Skin shows multiple superficial abrasions over the distal lateral humeral region.  Also skin abrasions along the hand.  Sensation is intact to the left upper arm.  Moderate ecchymosis surrounding the left shoulder up to the base of the neck, extending down to the elbow and forearm  down to just below the wrist.  Motor function - patient is able to actively palmar flex the wrist but unable to active extend the wrist up. His grip is intact  but weaker as compared to the right wrist. He is able to actively extend and spread the fingers of the left hand.  Assessment/Plan: Comminuted Left Proximal Humerus Fracture History of Multiple Falls Alcohol Abuse  The patient has been put into a sling immobilizer.  No motion or activity with the Left Upper Extremity. The patient is not a good surgical candidate.  Recommendation at this time will be for conservative treatment. Disposition planning? - May need skilled facility until his able to get back to a reasonably independent status to return home. Dr. Lequita Halt, who is on call for [facility] and covering for Dr. Ranell Patrick, will be by later to finalize plans.  PERKINS, ALEXZANDREW 07/04/2012, 2:26 PM   I have seen and examined the patient and concur with the above note. He has a comminuted displaced proximal 1/3 humerus fracture through an area that was previously fractured with question of whether it had ever fully healed as he never completed all of his follow-ups with Dr. Ranell Patrick. He does not recall any of the events associated with the current injury. His upper arm is significantly ecchymotic and full of abrasions. He does not have an open fracture. Pulses are intact distally. He can not comply with a sensory exam. He will actively extend his thumb thus  radial nerve function is at least partially intact. He will extend his wrist but not completely as he can not complete any tasks. This fracture will be treated non-operatively at this time. Dr. Ranell Patrick will have the patient follow-up in the office in a week. It is probably best for him to be in an environment such as a SNF where he can be observed, as he will be at risk for falling again and converting this to an open fracture

## 2012-07-04 NOTE — ED Notes (Signed)
X-ray at bedside

## 2012-07-04 NOTE — Progress Notes (Signed)
Orthopedic Tech Progress Note Patient Details:  Blake [name] 1941-06-17 [id number] Arm sling applied to Left UE. Patient had some bleeding to arm and Ortho Tech consulted nurse about what to do about that. Nurse stated that sling was temporary anyway until Orthopedic surgeon got to ED to see patient. Gauze pads applied over cuts for time being. Sling applied. Ortho Devices Type of Ortho Device: Arm foam sling Ortho Device/Splint Location: Left UE Ortho Device/Splint Interventions: Application   Asia R Thompson 07/04/2012, 1:24 PM

## 2012-07-04 NOTE — ED Notes (Signed)
Spoke with Ortho, coming down to apply arm sling

## 2012-07-04 NOTE — Progress Notes (Signed)
Orthopedic Tech Progress Note Patient Details:  Blake [name] Jan 31, 1941 [id number] Shoulder sling immobilizer applied to Left UE. Doctor in room to assist with moving patient and applying immobilizer. Ortho Devices Type of Ortho Device: Sling immobilizer Ortho Device/Splint Location: Left Ortho Device/Splint Interventions: Application   Asia R Thompson 07/04/2012, 2:27 PM

## 2012-07-04 NOTE — ED Notes (Addendum)
Pt perineal area red, excoriated. Cleaned of old stool & urine. Also noted to posterior thoracic area abrasion/reddened.C-collar removed per ED MD

## 2012-07-04 NOTE — ED Notes (Signed)
EMS reported pt to be in afib and no history

## 2012-07-04 NOTE — ED Provider Notes (Signed)
I saw and evaluated the patient, reviewed the resident's note and I agree with the findings and plan. I personally evaluated the ECG and agree with the interpretation of the resident  The patient has obvious deformity with soft compartments of his left upper extremity.  This is consistent with acute proximal left tissue murmurs fracture below the level of the callus of his prior humerus fracture.  Traction was applied and he was placed in a more comfortable position with his arm across his chest at the bedside.  Orthopedics consult it.  The patient is clinically dehydrated and has acute renal failure which is likely prerenal.  The patient be admitted to the hospitalist service for IV hydration and ongoing care   Lyanne Co, MD 07/04/12 1635

## 2012-07-04 NOTE — ED Notes (Signed)
Ortho MD at bedside.

## 2012-07-04 NOTE — ED Notes (Signed)
Pt states he fell a couple of days ago in bathroom and crawled to bedroom.  Neighbor came to check on him because he had not seen him in 3 days and pt has significant outward rotation of left arm and shoulder with questionable left humerus deformity with bruising.  Pt was incontinent.  Pt confused.  Pt reports drinking etoh daily and reports confusion and seeing things.  Pt body is cold.  Pt complains of back burning and butt hurting.  Various bruising and abrasions to LE

## 2012-07-04 NOTE — ED Notes (Signed)
Patient transported to CT 

## 2012-07-04 NOTE — ED Notes (Signed)
Obvious deformity to left shoulder & arm. Entire LUE bruised. Multiple abrasions 7 bruises noted to entire body. No obvious bleeding. Taken off LSB, C-collar remains in place.

## 2012-07-04 NOTE — ED Notes (Signed)
Admitting MD at bedside.

## 2012-07-05 ENCOUNTER — Encounter (HOSPITAL_COMMUNITY): Payer: Self-pay | Admitting: General Practice

## 2012-07-05 DIAGNOSIS — N19 Unspecified kidney failure: Secondary | ICD-10-CM

## 2012-07-05 LAB — CBC
Hemoglobin: 13.3 g/dL (ref 13.0–17.0)
MCH: 36.2 pg — ABNORMAL HIGH (ref 26.0–34.0)
RBC: 3.67 MIL/uL — ABNORMAL LOW (ref 4.22–5.81)
WBC: 8.4 10*3/uL (ref 4.0–10.5)

## 2012-07-05 LAB — CK TOTAL AND CKMB (NOT AT ARMC)
CK, MB: 6.2 ng/mL (ref 0.3–4.0)
Relative Index: 4.9 — ABNORMAL HIGH (ref 0.0–2.5)
Total CK: 127 U/L (ref 7–232)

## 2012-07-05 LAB — COMPREHENSIVE METABOLIC PANEL
AST: 51 U/L — ABNORMAL HIGH (ref 0–37)
BUN: 61 mg/dL — ABNORMAL HIGH (ref 6–23)
CO2: 13 mEq/L — ABNORMAL LOW (ref 19–32)
Calcium: 8.6 mg/dL (ref 8.4–10.5)
Creatinine, Ser: 1.34 mg/dL (ref 0.50–1.35)
GFR calc Af Amer: 60 mL/min — ABNORMAL LOW (ref >90)
GFR calc non Af Amer: 52 mL/min — ABNORMAL LOW (ref >90)

## 2012-07-05 LAB — URINE CULTURE: Colony Count: NO GROWTH

## 2012-07-05 LAB — TROPONIN I
Troponin I: <0.3 ng/mL (ref <0.30)
Troponin I: <0.3 ng/mL (ref <0.30)

## 2012-07-05 LAB — MAGNESIUM: Magnesium: 2.4 mg/dL (ref 1.5–2.5)

## 2012-07-05 LAB — TSH: TSH: 0.347 u[IU]/mL — ABNORMAL LOW (ref 0.350–4.500)

## 2012-07-05 MED ORDER — VITAMIN B-1 100 MG PO TABS
100.0000 mg | ORAL_TABLET | Freq: Every day | ORAL | Status: DC
  Administered 2012-07-05 – 2012-07-07 (×3): 100 mg via ORAL
  Filled 2012-07-05 (×3): qty 1

## 2012-07-05 MED ORDER — FOLIC ACID 1 MG PO TABS
1.0000 mg | ORAL_TABLET | Freq: Every day | ORAL | Status: DC
  Administered 2012-07-05 – 2012-07-07 (×3): 1 mg via ORAL
  Filled 2012-07-05 (×3): qty 1

## 2012-07-05 NOTE — Progress Notes (Signed)
Patient placed on Tele box : Tx [id number]. [facility] tele was notified.

## 2012-07-05 NOTE — Progress Notes (Signed)
CRITICAL VALUE ALERT  Critical value received:  CKMB : 6.2  Date of notification:  07/05/12  Time of notification:  1655  Critical value read back:yes  Nurse who received alert:  Mauro Kaufmann  MD notified (1st page):  Abrol, N.  Time of first page:  1655  MD notified (2nd page):  Time of second page:  Responding MD:  Jeanella Anton.  Time MD responded:  1655

## 2012-07-05 NOTE — Evaluation (Signed)
Occupational Therapy Evaluation Patient Details Name: Blake [name] MRN: [id number] DOB: 1941/01/31 Today's Date: 07/05/2012 Time: 4098-1191 OT Time Calculation (min): 36 min  OT Assessment / Plan / Recommendation Clinical Impression  71 yr old male admitted after fall resutling in left humeral fracture.  Pt with history of falls and ETOH.  Will benefit from acute OT to help increase balance, safety, and functional performance of basic selfcare tasks.  Will need SNF level rehab at discharge secondary to not having 24 hour supervision at home.  Feel pt's current sling is too large and not providing adequate support and pt also with wrist drop/weakness on the left side as well.  Discussed this with nursing. Feel he will also benefit from a pre-fab wrist cock-up splint to help position the left hand.    OT Assessment  Patient needs continued OT Services    Follow Up Recommendations  Skilled nursing facility    Barriers to Discharge Decreased caregiver support    Equipment Recommendations  None recommended by OT       Frequency  Min 2X/week    Precautions / Restrictions Precautions Precautions: Fall Precaution Comments: no LUE shoulder AROM Required Braces or Orthoses: Other Brace/Splint Other Brace/Splint: sling for LUE Restrictions Weight Bearing Restrictions: Yes LUE Weight Bearing: Non weight bearing   Pertinent Vitals/Pain Pt reported no pain with eval    ADL  Eating/Feeding: Simulated;Set up Where Assessed - Eating/Feeding: Edge of bed Grooming: Simulated;Minimal assistance Where Assessed - Grooming: Unsupported sitting Upper Body Bathing: Simulated;Minimal assistance Where Assessed - Upper Body Bathing: Unsupported sitting Lower Body Bathing: Simulated;Minimal assistance Where Assessed - Lower Body Bathing: Unsupported sit to stand Upper Body Dressing: Simulated;Moderate assistance Where Assessed - Upper Body Dressing: Unsupported sitting Lower Body Dressing:  Simulated;Maximal assistance Where Assessed - Lower Body Dressing: Unsupported sit to stand Toilet Transfer: Simulated;Minimal assistance Toilet Transfer Method: Other (comment) (ambulate with hand held assist) Toilet Transfer Equipment: Comfort height toilet Toileting - Clothing Manipulation and Hygiene: Simulated;Minimal assistance Tub/Shower Transfer Method: Not assessed Equipment Used: Other (comment) (sling) Transfers/Ambulation Related to ADLs: Pt requires min assist for moblity with hand held assist.  Darden step length noted with decreased weight shifting    OT Diagnosis: Generalized weakness;Acute pain;Cognitive deficits  OT Problem List: Decreased strength;Decreased range of motion;Decreased activity tolerance;Impaired balance (sitting and/or standing);Decreased knowledge of use of DME or AE;Decreased knowledge of precautions;Decreased cognition;Decreased coordination;Impaired sensation;Impaired UE functional use;Pain OT Treatment Interventions: Self-care/ADL training;Therapeutic activities;Therapeutic exercise;DME and/or AE instruction;Balance training;Patient/family education   OT Goals Acute Rehab OT Goals OT Goal Formulation: With patient Time For Goal Achievement: 07/19/12 Potential to Achieve Goals: Fair ADL Goals Pt Will Perform Grooming: with supervision;Standing at sink;Unsupported ADL Goal: Grooming - Progress: Goal set today Pt Will Perform Lower Body Bathing: with min assist;Unsupported;Sitting, edge of bed ADL Goal: Lower Body Bathing - Progress: Goal set today Pt Will Transfer to Toilet: with supervision;with DME;3-in-1 ADL Goal: Toilet Transfer - Progress: Goal set today Miscellaneous OT Goals Miscellaneous OT Goal #1:  Pt will increase left wrist AROM to neutral position in order to increase independence with basic selfcare tasks.   OT Goal: Miscellaneous Goal #1 - Progress: Goal set today  Visit Information  Last OT Received On: 07/05/12 Assistance Needed:  +1    Subjective Data  Subjective: "I would like to have some orange sherbert ice cream." Patient Stated Goal: Pt did not state but agreeable to out of bed.   Prior Functioning     Home Living Lives With:  Spouse Type of Home: Mobile home Home Access: Stairs to enter Entrance Stairs-Number of Steps: 5 Entrance Stairs-Rails: Right;Left Home Layout: One level Bathroom Shower/Tub: Health visitor: Standard Home Adaptive Equipment: Walker - rolling;Wheelchair - manual;Bedside commode/3-in-1 Prior Function Driving: Yes Dominant Hand: Right         Vision/Perception Vision - Assessment Vision Assessment: Vision not tested Perception Perception: Within Functional Limits Praxis Praxis: Intact   Cognition  Overall Cognitive Status: Impaired Area of Impairment: Memory;Safety/judgement;Awareness of deficits Arousal/Alertness: Awake/alert Orientation Level: Disoriented to;Time;Situation Behavior During Session: WFL for tasks performed Memory: Decreased recall of precautions Memory Deficits: Unable to recall reason for hospitalization Safety/Judgement: Decreased awareness of safety precautions    Extremity/Trunk Assessment Right Upper Extremity Assessment RUE ROM/Strength/Tone: Within functional levels RUE Sensation: WFL - Light Touch RUE Coordination: WFL - gross/fine motor Left Upper Extremity Assessment LUE ROM/Strength/Tone: Deficits LUE ROM/Strength/Tone Deficits: Left arm in sling secondary to new humeral fracture.  Significant bruising and swelling noted in the upper arm.  Pt with 80% gross finger flexion and extension.  only trace wirst extension noted against gravity however.  Noted sling too big for pt at this time, not providing support. LUE Sensation: Deficits LUE Sensation Deficits: decreased light touch sensitivity in the digits compared to the right side. LUE Coordination: Deficits LUE Coordination Deficits: Decreased FM capabilities secondary  do nerve impairment. Trunk Assessment Trunk Assessment: Normal     Mobility Bed Mobility Bed Mobility: Supine to Sit Supine to Sit: 3: Mod assist;HOB elevated;With rails Transfers Transfers: Sit to Stand Sit to Stand: 4: Min assist;Without upper extremity assist;From bed           Balance Balance Balance Assessed: Yes Static Standing Balance Static Standing - Balance Support: Right upper extremity supported Static Standing - Level of Assistance: 4: Min assist   End of Session OT - End of Session Activity Tolerance: Patient limited by fatigue Patient left: in bed;with call bell/phone within reach;with chair alarm set Nurse Communication: Mobility status;Other (comment) (Need for smaller sling and pre-fab wrist cock-up splint.)     Berlyn Saylor OTR/L Pager number [phone] 07/05/2012, 2:00 PM

## 2012-07-05 NOTE — Progress Notes (Addendum)
Clinical Social Work Department CLINICAL SOCIAL WORK PLACEMENT NOTE 07/05/2012  Patient:  Blake [name], Blake [name]  Account Number:  [id number] Admit date:  07/04/2012  Clinical Social Worker:  Unk Lightning, LCSW  Date/time:  07/05/2012 04:15 PM  Clinical Social Work is seeking post-discharge placement for this patient at the following level of care:   SKILLED NURSING   (*CSW will update this form in Epic as items are completed)   07/05/2012  Patient/family provided with [facility] Department of Clinical Social Work's list of facilities offering this level of care within the geographic area requested by the patient (or if unable, by the patient's family).  07/05/2012  Patient/family informed of their freedom to choose among providers that offer the needed level of care, that participate in Medicare, Medicaid or managed care program needed by the patient, have an available bed and are willing to accept the patient.  07/05/2012  Patient/family informed of [facility]' ownership interest in [facility], as well as of the fact that they are under no obligation to receive care at this facility.  PASARR submitted to EDS on existing # PASARR number received from EDS on   FL2 transmitted to all facilities in geographic area requested by pt/family on  07/05/2012 FL2 transmitted to all facilities within larger geographic area on   Patient informed that his/her managed care company has contracts with or will negotiate with  certain facilities, including the following:     Patient/family informed of bed offers received:  07/07/12 Patient chooses bed at [facility] Physician recommends and patient chooses bed at    Patient to be transferred to [place]   on  07/07/12 Patient to be transferred to facility by Ambulance [facility])  The following physician request were entered in Epic:   Additional Comments: Patient is agreeable to d/c. He does not have any family to contact and he is unable to  provide numbers for CSW to call.  OK per SNF and pt's nurse Nettie Elm.  Marland Kitchendcsign

## 2012-07-05 NOTE — Progress Notes (Signed)
[facility] PROGRESS NOTE  Blake [name] AVW:[id number] DOB: Jan 25, 1941 DOA: 07/04/2012 PCP: Michele Mcalpine, MD  Assessment/Plan: Active Problems:  ETOH abuse  Fall  Left humeral fracture  ARF (acute renal failure)  Confusion    1. Left humeral fracture nonoperative management, may need placement, avoid narcotics IV, but does use oxycodone for pain control 2. Alcohol dependence social work consultation will be obtained for possible placement, poor psychosocial situation, will start the patient on CIWA protocol, start thiamine and folic acid 3. Microcytic anemia likely secondary to alcohol dependence, stable, monitor 4. Acute renal insufficiency secondary to rhabdomyolysis, elevated CKs related to the fall, monitor CK, continue IV hydration  Code Status: full Family Communication: family updated about patient's clinical progress Disposition Plan:  As above    Brief narrative: 71 year old male seen in the [facility] ED today for multiple issues. It is reported in the chart that he had a fall a 'couple of days ago in bathroom and crawled to bedroom. Neighbor came to check on him because he had not seen him in 3 days and pt has significant outward rotation of left arm and shoulder with questionable left humerus deformity with bruising'.  He is a patient of Dr. Beverely Low and is currently being followed for a left humerus fracture. Our last office visit was back on 01/18/12 and was in follow up for his left shoulder proximal humerus fracture. He was supposed to follow back up 4 weeks later for repeat x-rays. That was the last visit we had on record. Our records indicate that he had a fall back in March of this year whre he sustained the left humerus fracture originally. He was admitted by the Hospital service and later followed up with [facility] thru the month of April 2013.  Upon arrival today, the patient was reported to be confused. He reported to drinking ETOH daily and tells me today that he has  already had at least a 6-pack already. He states that he falls and will pass out and that's why he is scared to drive.  He states that the left arm is painful but is a very poor historian and cannot recall any recent events surrounding a fall.      Consultants:  Orthopedics  Procedures:  None  Antibiotics: None   HPI/Subjective: Left shoulder the sling immobilizer Pain-free  Objective: Filed Vitals:   07/04/12 1630 07/04/12 1838 07/04/12 2122 07/05/12 0550  BP: 126/81 126/69 140/82 133/81  Pulse: 88 95 98   Temp: 97.9 F (36.6 C) 97.9 F (36.6 C) 97.5 F (36.4 C) 97.4 F (36.3 C)  TempSrc: Oral  Oral Axillary  Resp: 16 16 14 14   SpO2: 97% 99% 100% 98%    Intake/Output Summary (Last 24 hours) at 07/05/12 0938 Last data filed at 07/05/12 0542  Gross per 24 hour  Intake   3000 ml  Output    900 ml  Net   2100 ml    Exam:  HENT:  Head: Atraumatic.  Nose: Nose normal.  Mouth/Throat: Oropharynx is clear and moist.  Eyes: Conjunctivae are normal. Pupils are equal, round, and reactive to light. No scleral icterus.  Neck: Neck supple. No tracheal deviation present.  Cardiovascular: Normal rate, regular rhythm, normal heart sounds and intact distal pulses.  Pulmonary/Chest: Effort normal and breath sounds normal. No respiratory distress.  Abdominal: Soft. Normal appearance and bowel sounds are normal. She exhibits no distension. There is no tenderness.  Musculoskeletal: She exhibits no edema and no  tenderness.  Neurological: She is alert. No cranial nerve deficit.    Data Reviewed: Basic Metabolic Panel:  Lab 07/05/12 1610 07/04/12 1029  NA 144 144  K 3.6 4.2  CL 115* 105  CO2 13* 21  GLUCOSE 72 125*  BUN 61* 88*  CREATININE 1.34 1.99*  CALCIUM 8.6 9.5  MG -- --  PHOS -- --    Liver Function Tests:  Lab 07/05/12 0205 07/04/12 1029  AST 51* 56*  ALT 27 35  ALKPHOS 61 69  BILITOT 1.4* 2.0*  PROT 5.6* 6.7  ALBUMIN 2.8* 3.5   No results found  for this basename: LIPASE:5,AMYLASE:5 in the last 168 hours No results found for this basename: AMMONIA:5 in the last 168 hours  CBC:  Lab 07/05/12 0205 07/04/12 1029  WBC 8.4 12.1*  NEUTROABS -- 9.9*  HGB 13.3 14.9  HCT 38.4* 42.2  MCV 104.6* 101.0*  PLT 106* 129*    Cardiac Enzymes:  Lab 07/05/12 0200 07/04/12 2007 07/04/12 1029  CKTOTAL -- -- 516*  CKMB -- -- --  CKMBINDEX -- -- --  TROPONINI <0.30 <0.30 --   BNP (last 3 results) No results found for this basename: PROBNP:3 in the last 8760 hours   CBG: No results found for this basename: GLUCAP:5 in the last 168 hours  No results found for this or any previous visit (from the past 240 hour(s)).   Studies: Dg Elbow 2 Views Left  07/04/2012  *RADIOLOGY REPORT*  Clinical Data: Fall.  Combative.  LEFT HUMERUS - 2+ VIEW,LEFT ELBOW - 2 VIEW,LEFT FOREARM - 2 VIEW  Comparison: 12/09/2011 left humerus.  Findings: The patient was not cooperative.  A single portable view of the left humerus, elbow and forearm was able to be obtained. This limits evaluation for subtle injury.  In this patient who has had a previous comminuted fracture of the proximal left humeral shaft/surgical neck region, there is evidence of an acute comminuted fracture with marked separation of fracture fragments and overriding of the major fracture fragments.  No other obvious fracture noted.  IMPRESSION: The patient was not cooperative.  A single portable view of the left humerus, elbow and forearm was able to be obtained.  This limits evaluation for subtle injury.  In this patient who has had a previous comminuted fracture of the proximal left humeral shaft/surgical neck region, there is evidence of an acute comminuted fracture with marked separation of fracture fragments and overriding of the major fracture fragments.  No other obvious fracture noted.   Original Report Authenticated By: Fuller Canada, M.D.    Dg Forearm Left  07/04/2012  *RADIOLOGY REPORT*   Clinical Data: Fall.  Combative.  LEFT HUMERUS - 2+ VIEW,LEFT ELBOW - 2 VIEW,LEFT FOREARM - 2 VIEW  Comparison: 12/09/2011 left humerus.  Findings: The patient was not cooperative.  A single portable view of the left humerus, elbow and forearm was able to be obtained. This limits evaluation for subtle injury.  In this patient who has had a previous comminuted fracture of the proximal left humeral shaft/surgical neck region, there is evidence of an acute comminuted fracture with marked separation of fracture fragments and overriding of the major fracture fragments.  No other obvious fracture noted.  IMPRESSION: The patient was not cooperative.  A single portable view of the left humerus, elbow and forearm was able to be obtained.  This limits evaluation for subtle injury.  In this patient who has had a previous comminuted fracture of the proximal left  humeral shaft/surgical neck region, there is evidence of an acute comminuted fracture with marked separation of fracture fragments and overriding of the major fracture fragments.  No other obvious fracture noted.   Original Report Authenticated By: Fuller Canada, M.D.    Ct Head Wo Contrast  07/04/2012  *RADIOLOGY REPORT*  Clinical Data:  Fall.  CT HEAD WITHOUT CONTRAST CT CERVICAL SPINE WITHOUT CONTRAST  Technique:  Multidetector CT imaging of the head and cervical spine was performed following the standard protocol without intravenous contrast.  Multiplanar CT image reconstructions of the cervical spine were also generated.  Comparison:  05/23/2009.  CT HEAD  Findings: Prior orbital fracture with surgery.  No acute skull fracture.  No intracranial hemorrhage.  Global atrophy.  Ventricles slightly more prominent than on the prior examination which may be related to atrophy rather than hydrocephalus.  Small vessel disease type changes without CT evidence of large acute infarct.  No intracranial mass lesion detected on this unenhanced exam.  IMPRESSION: No acute  skull fracture or intracranial hemorrhage.  Global atrophy.  Ventricular prominence more notable than on the prior exam which may be related to atrophy rather hydrocephalus.  Small vessel disease type changes without CT evidence of large acute infarct.  CT CERVICAL SPINE  Findings: No cervical spine fracture or malalignment.  Cervical spondylotic changes with spinal stenosis most notable C3- 4, C4-5 and C5-6.  Focal ossification of the posterior longitudinal ligament posterior to the C6 vertebra contributes to spinal stenosis centrally and is without change.  Left sided C6-7 disc protrusion left sided spinal stenosis.  1 cm left thyroid lesion.  This can be followed with thyroid ultrasound.  Carotid bifurcation calcifications.  Slight increase in size of slightly septated fatty lesion within the posterior neck soft tissue at the C4-5 level measuring 4.6 x 2.9 x 2.3 cm.  This can be followed with contrast enhanced soft tissue MRI on elective basis.  IMPRESSION: No cervical spine fracture or malalignment.  Cervical spondylotic changes with spinal stenosis as noted above.  1 cm left thyroid lesion.  This can be followed with thyroid ultrasound.  Slight increase in size of slightly septated fatty lesion within the posterior neck soft tissue at the C4-5 level measuring 4.6 x 2.9 x 2.3 cm.  This can be followed with contrast enhanced soft tissue MRI on elective basis.   Original Report Authenticated By: Fuller Canada, M.D.    Ct Cervical Spine Wo Contrast  07/04/2012  *RADIOLOGY REPORT*  Clinical Data:  Fall.  CT HEAD WITHOUT CONTRAST CT CERVICAL SPINE WITHOUT CONTRAST  Technique:  Multidetector CT imaging of the head and cervical spine was performed following the standard protocol without intravenous contrast.  Multiplanar CT image reconstructions of the cervical spine were also generated.  Comparison:  05/23/2009.  CT HEAD  Findings: Prior orbital fracture with surgery.  No acute skull fracture.  No intracranial  hemorrhage.  Global atrophy.  Ventricles slightly more prominent than on the prior examination which may be related to atrophy rather than hydrocephalus.  Small vessel disease type changes without CT evidence of large acute infarct.  No intracranial mass lesion detected on this unenhanced exam.  IMPRESSION: No acute skull fracture or intracranial hemorrhage.  Global atrophy.  Ventricular prominence more notable than on the prior exam which may be related to atrophy rather hydrocephalus.  Small vessel disease type changes without CT evidence of large acute infarct.  CT CERVICAL SPINE  Findings: No cervical spine fracture or malalignment.  Cervical  spondylotic changes with spinal stenosis most notable C3- 4, C4-5 and C5-6.  Focal ossification of the posterior longitudinal ligament posterior to the C6 vertebra contributes to spinal stenosis centrally and is without change.  Left sided C6-7 disc protrusion left sided spinal stenosis.  1 cm left thyroid lesion.  This can be followed with thyroid ultrasound.  Carotid bifurcation calcifications.  Slight increase in size of slightly septated fatty lesion within the posterior neck soft tissue at the C4-5 level measuring 4.6 x 2.9 x 2.3 cm.  This can be followed with contrast enhanced soft tissue MRI on elective basis.  IMPRESSION: No cervical spine fracture or malalignment.  Cervical spondylotic changes with spinal stenosis as noted above.  1 cm left thyroid lesion.  This can be followed with thyroid ultrasound.  Slight increase in size of slightly septated fatty lesion within the posterior neck soft tissue at the C4-5 level measuring 4.6 x 2.9 x 2.3 cm.  This can be followed with contrast enhanced soft tissue MRI on elective basis.   Original Report Authenticated By: Fuller Canada, M.D.    Dg Chest Portable 1 View  07/04/2012  *RADIOLOGY REPORT*  Clinical Data: Altered mental status, fall, history COPD, smoking, hypertension, diabetes  PORTABLE CHEST - 1 VIEW   Comparison: Portable exam 1021 hours compared to 12/10/2011  Findings: Normal heart size, mediastinal contours, and pulmonary vascularity. Numerous cardiac monitoring leads project over chest. Lungs grossly clear. No pleural effusion or pneumothorax. Old healed bilateral inferior rib fractures. Osseous demineralization. Age indeterminate fracture at a lateral inferior right rib.  IMPRESSION: Multiple old bilateral rib fractures with an age indeterminate fracture a lateral inferior right rib, approximately eighth. No acute pulmonary abnormalities.   Original Report Authenticated By: Lollie Marrow, M.D.    Dg Humerus Left  07/04/2012  *RADIOLOGY REPORT*  Clinical Data: Fall.  Combative.  LEFT HUMERUS - 2+ VIEW,LEFT ELBOW - 2 VIEW,LEFT FOREARM - 2 VIEW  Comparison: 12/09/2011 left humerus.  Findings: The patient was not cooperative.  A single portable view of the left humerus, elbow and forearm was able to be obtained. This limits evaluation for subtle injury.  In this patient who has had a previous comminuted fracture of the proximal left humeral shaft/surgical neck region, there is evidence of an acute comminuted fracture with marked separation of fracture fragments and overriding of the major fracture fragments.  No other obvious fracture noted.  IMPRESSION: The patient was not cooperative.  A single portable view of the left humerus, elbow and forearm was able to be obtained.  This limits evaluation for subtle injury.  In this patient who has had a previous comminuted fracture of the proximal left humeral shaft/surgical neck region, there is evidence of an acute comminuted fracture with marked separation of fracture fragments and overriding of the major fracture fragments.  No other obvious fracture noted.   Original Report Authenticated By: Fuller Canada, M.D.     Scheduled Meds:   . sodium chloride  1,000 mL Intravenous Once  . fentaNYL  50 mcg Intravenous Once  . fentaNYL  50 mcg Intravenous Once  .  folic acid  1 mg Oral Daily  . gemfibrozil  600 mg Oral BID AC  . sodium chloride  1,000 mL Intravenous Once  . thiamine  100 mg Oral Daily   Continuous Infusions:   . sodium chloride 1,000 mL (07/04/12 1649)  . sodium chloride 100 mL/hr at 07/05/12 0533    Active Problems:  ETOH abuse  Fall  Left humeral fracture  ARF (acute renal failure)  Confusion    Time spent: 40 minutes   [facility]  [facility] Pager [phone]. If 8PM-8AM, please contact night-coverage at [email or web address], password [facility] 07/05/2012, 9:38 AM  LOS: 1 day

## 2012-07-05 NOTE — Progress Notes (Signed)
Clinical Social Work Department BRIEF PSYCHOSOCIAL ASSESSMENT 07/05/2012  Patient:  Blake [name], Blake [name]     Account Number:  [id number]     Admit date:  07/04/2012  Clinical Social Worker:  Dennison Bulla  Date/Time:  07/05/2012 04:15 PM  Referred by:  Physician  Date Referred:  07/05/2012 Referred for  SNF Placement   Other Referral:   Interview type:  Patient Other interview type:    PSYCHOSOCIAL DATA Living Status:  ALONE Admitted from facility:   Level of care:   Primary support name:  Debbie Primary support relationship to patient:  FRIEND Degree of support available:   Lacking    CURRENT CONCERNS Current Concerns  Post-Acute Placement   Other Concerns:    SOCIAL WORK ASSESSMENT / PLAN CSW received referral to assist with dc planning. Patient needs SNF placement per chart review. CSW met with patient at bedside. No visitors were present.    CSW introduced myself and explained role. Patient reports he lives alone and does not have any support. Patient reports that all of his family has passed away. Patient gave CSW permission to contact emergency contact listed Eunice Blase). CSW called friend who reported she used to be married to patient's nephew. Friend reports patient has a niece but is unsure where she lives, her name or her phone number. Friend does not wish to be included in patient's plans.    CSW spoke with patient regarding SNF placement. Patient reports he has been to [facility] in [place] in the past. Patient is agreeable to SNF placement again. CSW provided patient with SNF list and explained process. Patient agreeable to SNF search in [facility].    CSW and patient discussed patient's substance use. Patient reports he drinks half a fifth of liquor every day. Patient reports he likes to drink because he has nothing else better to do. CSW and patient discussed negative affects that alcohol can have on patient's health. Patient is aware but reports he will continue to  drink. Patient declined all resources. Patient is aware he cannot drink at any SNF. CSW completed SBIRT.    CSW completed FL2 and completed search. CSW will follow up with bed offers.   Assessment/plan status:  Psychosocial Support/Ongoing Assessment of Needs Other assessment/ plan:   SBIRT   Information/referral to community resources:   SNF list    PATIENT'S/FAMILY'S RESPONSE TO PLAN OF CARE: Patient alert and in bed. Patient agreeable to CSW consult but minimally engaged with limited eye contact. Patient agreeable to SNF search. Patient minimizes affect of alcohol and is not interested in decreasing use or getting any treatment.

## 2012-07-05 NOTE — Progress Notes (Signed)
Orthopedic Tech Progress Note Patient Details:  Blake [name] 1940/12/08 [id number]  Ortho Devices Type of Ortho Device: Velcro wrist splint Ortho Device/Splint Location: (L) UE Ortho Device/Splint Interventions: Ordered;Application   Jennye Moccasin 07/05/2012, 2:58 PM

## 2012-07-06 NOTE — Evaluation (Signed)
Physical Therapy Evaluation Patient Details Name: Blake [name] MRN: [id number] DOB: May 05, 1941 Today's Date: 07/06/2012  PT Assessment / Plan / Recommendation Clinical Impression  Pt admitted s/p fall with L humeral fx. Pt also with extensive ETOH abuse with multiple falls. Pt presents with extremely unsteady gait and balance during all mobility. Pt will benefit from skilled PT in the acute care setting in order to maximize functional mobiltiy and safety     PT Assessment  Patient needs continued PT services    Follow Up Recommendations  Post acute inpatient    Does the patient have the potential to tolerate intense rehabilitation   No, Recommend SNF  Barriers to Discharge        Equipment Recommendations  None recommended by PT;None recommended by OT    Recommendations for Other Services     Frequency Min 3X/week    Precautions / Restrictions Precautions Precautions: Fall Precaution Comments: no LUE shoulder AROM Required Braces or Orthoses: Other Brace/Splint Other Brace/Splint: sling for LUE Restrictions Weight Bearing Restrictions: Yes LUE Weight Bearing: Non weight bearing   Pertinent Vitals/Pain Pt with 5/10 shoulder pain.       Mobility  Bed Mobility Bed Mobility: Supine to Sit Supine to Sit: 3: Mod assist;HOB elevated;With rails Details for Bed Mobility Assistance: VC for sequencing. Assist through trunk for stability. Transfers Transfers: Sit to Stand;Stand to Sit Sit to Stand: 4: Min assist;Without upper extremity assist;From bed Stand to Sit: 4: Min assist;With upper extremity assist;To chair/3-in-1 Details for Transfer Assistance: VC for hand placement. Slightly unsteady upon standing requiring min assist for stability.  Ambulation/Gait Ambulation/Gait Assistance: 3: Mod assist;2: Max assist Assistive device: 1 person hand held assist Gait Pattern: Ataxic;Narrow base of support    Shoulder Instructions     Exercises     PT Diagnosis: Difficulty  walking;Abnormality of gait;Acute pain  PT Problem List: Decreased activity tolerance;Decreased balance;Decreased mobility;Decreased knowledge of use of DME;Decreased safety awareness;Decreased knowledge of precautions;Pain PT Treatment Interventions: DME instruction;Gait training;Functional mobility training;Therapeutic activities;Balance training;Patient/family education   PT Goals Acute Rehab PT Goals PT Goal Formulation: With patient Time For Goal Achievement: 07/20/12 Potential to Achieve Goals: Fair Pt will go Supine/Side to Sit: with modified independence Pt will go Sit to Supine/Side: with modified independence Pt will go Sit to Stand: with modified independence Pt will go Stand to Sit: with modified independence Pt will Transfer Bed to Chair/Chair to Bed: with supervision Pt will Ambulate: 51 - 150 feet;with supervision;with least restrictive assistive device  Visit Information  Assistance Needed: +1    Subjective Data  Subjective: "Just give me some liquor and this balance will get better."   Prior Functioning  Home Living Lives With: Spouse Type of Home: Mobile home Home Access: Stairs to enter Entrance Stairs-Number of Steps: 5 Entrance Stairs-Rails: Right;Left Home Layout: One level Bathroom Shower/Tub: Health visitor: Standard Home Adaptive Equipment: Walker - rolling;Wheelchair - manual;Bedside commode/3-in-1 Prior Function Level of Independence: Independent Able to Take Stairs?: Yes Driving: Yes Communication Communication: No difficulties Dominant Hand: Right    Cognition  Overall Cognitive Status: Impaired Area of Impairment: Memory;Safety/judgement;Awareness of deficits Arousal/Alertness: Awake/alert Orientation Level: Disoriented to;Time;Situation Behavior During Session: WFL for tasks performed Memory: Decreased recall of precautions Memory Deficits: Unable to recall reason for hospitalization Safety/Judgement: Decreased awareness  of safety precautions Awareness of Deficits: decreased    Extremity/Trunk Assessment Right Lower Extremity Assessment RLE ROM/Strength/Tone: Within functional levels RLE Sensation: WFL - Light Touch Left Lower Extremity Assessment LLE ROM/Strength/Tone:  Within functional levels LLE Sensation: WFL - Light Touch   Balance Balance Balance Assessed: Yes (see gait)  End of Session PT - End of Session Equipment Utilized During Treatment: Gait belt Activity Tolerance: Patient tolerated treatment well Patient left: in chair;with call bell/phone within reach;with nursing in room Nurse Communication: Mobility status    Milana Kidney 07/06/2012, 4:02 PM  07/06/2012 Milana Kidney DPT PAGER: [phone] OFFICE: [phone]

## 2012-07-06 NOTE — Progress Notes (Signed)
[facility] PROGRESS NOTE  Osvin Tyner Borre ZOX:[id number] DOB: [birth year] DOA: 07/04/2012 PCP: Michele Mcalpine, MD  Assessment/Plan: Active Problems:  ETOH abuse  Fall  Left humeral fracture  ARF (acute renal failure)  Confusion    1.  Left humeral fracture nonoperative management, may need placement, avoid narcotics IV, but does use oxycodone for pain control,Dr. Ranell Patrick will have the patient follow-up in the office in a week discharge to SNF when bed available 1.  2. Alcohol dependence social work consultation will be obtained for possible placement, poor psychosocial situation, will start the patient on CIWA protocol, start thiamine and folic acid 3. Macrocytic anemia likely secondary to alcohol dependence, stable, monitor 4. Acute renal insufficiency secondary to rhabdomyolysis, elevated CKs related to the fall, monitor CK, continue IV hydration CK improving 5.  6.  Code Status: full  Family Communication: family updated about patient's clinical progress  Disposition Plan: Possibly discharge tomorrow then available   Brief narrative:  71 year old male seen in the [facility] ED today for multiple issues. It is reported in the chart that he had a fall a 'couple of days ago in bathroom and crawled to bedroom. Neighbor came to check on him because he had not seen him in 3 days and pt has significant outward rotation of left arm and shoulder with questionable left humerus deformity with bruising'.  He is a patient of Dr. Beverely Low and is currently being followed for a left humerus fracture. Our last office visit was back on 01/18/12 and was in follow up for his left shoulder proximal humerus fracture. He was supposed to follow back up 4 weeks later for repeat x-rays. That was the last visit we had on record. Our records indicate that he had a fall back in March of this year whre he sustained the left humerus fracture originally. He was admitted by the Hospital service and later followed up with  [facility] thru the month of April 2013.  Upon arrival today, the patient was reported to be confused. He reported to drinking ETOH daily and tells me today that he has already had at least a 6-pack already. He states that he falls and will pass out and that's why he is scared to drive.  He states that the left arm is painful but is a very poor historian and cannot recall any recent events surrounding a fall.    Consultants:  Orthopedics Procedures:  None Antibiotics:  None    HPI/Subjective:  Left shoulder the sling immobilizer  Objective: Filed Vitals:   07/05/12 0550 07/05/12 1500 07/05/12 2200 07/06/12 0527  BP: 133/81 150/60 164/71 152/84  Pulse:  80 79 86  Temp: 97.4 F (36.3 C) 97.6 F (36.4 C) 97.7 F (36.5 C) 98 F (36.7 C)  TempSrc: Axillary     Resp: 14 16 16 16   SpO2: 98% 100% 100% 100%    Intake/Output Summary (Last 24 hours) at 07/06/12 0908 Last data filed at 07/06/12 0700  Gross per 24 hour  Intake   2040 ml  Output    900 ml  Net   1140 ml    Exam:  HENT:  Head: Atraumatic.  Nose: Nose normal.  Mouth/Throat: Oropharynx is clear and moist.  Eyes: Conjunctivae are normal. Pupils are equal, round, and reactive to light. No scleral icterus.  Neck: Neck supple. No tracheal deviation present.  Cardiovascular: Normal rate, regular rhythm, normal heart sounds and intact distal pulses.  Pulmonary/Chest: Effort normal and breath sounds normal. No  respiratory distress.  Abdominal: Soft. Normal appearance and bowel sounds are normal. She exhibits no distension. There is no tenderness.  Musculoskeletal: She exhibits no edema and no tenderness.  Neurological: She is alert. No cranial nerve deficit.    Data Reviewed: Basic Metabolic Panel:  Lab 07/05/12 1610 07/05/12 0205 07/04/12 1029  NA -- 144 144  K -- 3.6 4.2  CL -- 115* 105  CO2 -- 13* 21  GLUCOSE -- 72 125*  BUN -- 61* 88*  CREATININE -- 1.34 1.99*  CALCIUM -- 8.6 9.5  MG 2.4 -- --  PHOS -- -- --      Liver Function Tests:  Lab 07/05/12 0205 07/04/12 1029  AST 51* 56*  ALT 27 35  ALKPHOS 61 69  BILITOT 1.4* 2.0*  PROT 5.6* 6.7  ALBUMIN 2.8* 3.5   No results found for this basename: LIPASE:5,AMYLASE:5 in the last 168 hours No results found for this basename: AMMONIA:5 in the last 168 hours  CBC:  Lab 07/05/12 0205 07/04/12 1029  WBC 8.4 12.1*  NEUTROABS -- 9.9*  HGB 13.3 14.9  HCT 38.4* 42.2  MCV 104.6* 101.0*  PLT 106* 129*    Cardiac Enzymes:  Lab 07/05/12 1322 07/05/12 0200 07/04/12 2007 07/04/12 1029  CKTOTAL 127 -- -- 516*  CKMB 6.2* -- -- --  CKMBINDEX -- -- -- --  TROPONINI <0.30 <0.30 <0.30 --   BNP (last 3 results) No results found for this basename: PROBNP:3 in the last 8760 hours   CBG: No results found for this basename: GLUCAP:5 in the last 168 hours  Recent Results (from the past 240 hour(s))  URINE CULTURE     Status: Normal   Collection Time   07/04/12  1:56 PM      Component Value Range Status Comment   Specimen Description URINE, CATHETERIZED   Final    Special Requests NONE   Final    Culture  Setup Time 07/04/2012 14:45   Final    Colony Count NO GROWTH   Final    Culture NO GROWTH   Final    Report Status 07/05/2012 FINAL   Final      Studies: Dg Elbow 2 Views Left  07/04/2012  *RADIOLOGY REPORT*  Clinical Data: Fall.  Combative.  LEFT HUMERUS - 2+ VIEW,LEFT ELBOW - 2 VIEW,LEFT FOREARM - 2 VIEW  Comparison: 12/09/2011 left humerus.  Findings: The patient was not cooperative.  A single portable view of the left humerus, elbow and forearm was able to be obtained. This limits evaluation for subtle injury.  In this patient who has had a previous comminuted fracture of the proximal left humeral shaft/surgical neck region, there is evidence of an acute comminuted fracture with marked separation of fracture fragments and overriding of the major fracture fragments.  No other obvious fracture noted.  IMPRESSION: The patient was not cooperative.   A single portable view of the left humerus, elbow and forearm was able to be obtained.  This limits evaluation for subtle injury.  In this patient who has had a previous comminuted fracture of the proximal left humeral shaft/surgical neck region, there is evidence of an acute comminuted fracture with marked separation of fracture fragments and overriding of the major fracture fragments.  No other obvious fracture noted.   Original Report Authenticated By: Fuller Canada, M.D.    Dg Forearm Left  07/04/2012  *RADIOLOGY REPORT*  Clinical Data: Fall.  Combative.  LEFT HUMERUS - 2+ VIEW,LEFT ELBOW - 2 VIEW,LEFT  FOREARM - 2 VIEW  Comparison: 12/09/2011 left humerus.  Findings: The patient was not cooperative.  A single portable view of the left humerus, elbow and forearm was able to be obtained. This limits evaluation for subtle injury.  In this patient who has had a previous comminuted fracture of the proximal left humeral shaft/surgical neck region, there is evidence of an acute comminuted fracture with marked separation of fracture fragments and overriding of the major fracture fragments.  No other obvious fracture noted.  IMPRESSION: The patient was not cooperative.  A single portable view of the left humerus, elbow and forearm was able to be obtained.  This limits evaluation for subtle injury.  In this patient who has had a previous comminuted fracture of the proximal left humeral shaft/surgical neck region, there is evidence of an acute comminuted fracture with marked separation of fracture fragments and overriding of the major fracture fragments.  No other obvious fracture noted.   Original Report Authenticated By: Fuller Canada, M.D.    Ct Head Wo Contrast  07/04/2012  *RADIOLOGY REPORT*  Clinical Data:  Fall.  CT HEAD WITHOUT CONTRAST CT CERVICAL SPINE WITHOUT CONTRAST  Technique:  Multidetector CT imaging of the head and cervical spine was performed following the standard protocol without intravenous  contrast.  Multiplanar CT image reconstructions of the cervical spine were also generated.  Comparison:  05/23/2009.  CT HEAD  Findings: Prior orbital fracture with surgery.  No acute skull fracture.  No intracranial hemorrhage.  Global atrophy.  Ventricles slightly more prominent than on the prior examination which may be related to atrophy rather than hydrocephalus.  Small vessel disease type changes without CT evidence of large acute infarct.  No intracranial mass lesion detected on this unenhanced exam.  IMPRESSION: No acute skull fracture or intracranial hemorrhage.  Global atrophy.  Ventricular prominence more notable than on the prior exam which may be related to atrophy rather hydrocephalus.  Small vessel disease type changes without CT evidence of large acute infarct.  CT CERVICAL SPINE  Findings: No cervical spine fracture or malalignment.  Cervical spondylotic changes with spinal stenosis most notable C3- 4, C4-5 and C5-6.  Focal ossification of the posterior longitudinal ligament posterior to the C6 vertebra contributes to spinal stenosis centrally and is without change.  Left sided C6-7 disc protrusion left sided spinal stenosis.  1 cm left thyroid lesion.  This can be followed with thyroid ultrasound.  Carotid bifurcation calcifications.  Slight increase in size of slightly septated fatty lesion within the posterior neck soft tissue at the C4-5 level measuring 4.6 x 2.9 x 2.3 cm.  This can be followed with contrast enhanced soft tissue MRI on elective basis.  IMPRESSION: No cervical spine fracture or malalignment.  Cervical spondylotic changes with spinal stenosis as noted above.  1 cm left thyroid lesion.  This can be followed with thyroid ultrasound.  Slight increase in size of slightly septated fatty lesion within the posterior neck soft tissue at the C4-5 level measuring 4.6 x 2.9 x 2.3 cm.  This can be followed with contrast enhanced soft tissue MRI on elective basis.   Original Report  Authenticated By: Fuller Canada, M.D.    Ct Cervical Spine Wo Contrast  07/04/2012  *RADIOLOGY REPORT*  Clinical Data:  Fall.  CT HEAD WITHOUT CONTRAST CT CERVICAL SPINE WITHOUT CONTRAST  Technique:  Multidetector CT imaging of the head and cervical spine was performed following the standard protocol without intravenous contrast.  Multiplanar CT image reconstructions of  the cervical spine were also generated.  Comparison:  05/23/2009.  CT HEAD  Findings: Prior orbital fracture with surgery.  No acute skull fracture.  No intracranial hemorrhage.  Global atrophy.  Ventricles slightly more prominent than on the prior examination which may be related to atrophy rather than hydrocephalus.  Small vessel disease type changes without CT evidence of large acute infarct.  No intracranial mass lesion detected on this unenhanced exam.  IMPRESSION: No acute skull fracture or intracranial hemorrhage.  Global atrophy.  Ventricular prominence more notable than on the prior exam which may be related to atrophy rather hydrocephalus.  Small vessel disease type changes without CT evidence of large acute infarct.  CT CERVICAL SPINE  Findings: No cervical spine fracture or malalignment.  Cervical spondylotic changes with spinal stenosis most notable C3- 4, C4-5 and C5-6.  Focal ossification of the posterior longitudinal ligament posterior to the C6 vertebra contributes to spinal stenosis centrally and is without change.  Left sided C6-7 disc protrusion left sided spinal stenosis.  1 cm left thyroid lesion.  This can be followed with thyroid ultrasound.  Carotid bifurcation calcifications.  Slight increase in size of slightly septated fatty lesion within the posterior neck soft tissue at the C4-5 level measuring 4.6 x 2.9 x 2.3 cm.  This can be followed with contrast enhanced soft tissue MRI on elective basis.  IMPRESSION: No cervical spine fracture or malalignment.  Cervical spondylotic changes with spinal stenosis as noted above.   1 cm left thyroid lesion.  This can be followed with thyroid ultrasound.  Slight increase in size of slightly septated fatty lesion within the posterior neck soft tissue at the C4-5 level measuring 4.6 x 2.9 x 2.3 cm.  This can be followed with contrast enhanced soft tissue MRI on elective basis.   Original Report Authenticated By: Fuller Canada, M.D.    Dg Chest Portable 1 View  07/04/2012  *RADIOLOGY REPORT*  Clinical Data: Altered mental status, fall, history COPD, smoking, hypertension, diabetes  PORTABLE CHEST - 1 VIEW  Comparison: Portable exam 1021 hours compared to 12/10/2011  Findings: Normal heart size, mediastinal contours, and pulmonary vascularity. Numerous cardiac monitoring leads project over chest. Lungs grossly clear. No pleural effusion or pneumothorax. Old healed bilateral inferior rib fractures. Osseous demineralization. Age indeterminate fracture at a lateral inferior right rib.  IMPRESSION: Multiple old bilateral rib fractures with an age indeterminate fracture a lateral inferior right rib, approximately eighth. No acute pulmonary abnormalities.   Original Report Authenticated By: Lollie Marrow, M.D.    Dg Humerus Left  07/04/2012  *RADIOLOGY REPORT*  Clinical Data: Fall.  Combative.  LEFT HUMERUS - 2+ VIEW,LEFT ELBOW - 2 VIEW,LEFT FOREARM - 2 VIEW  Comparison: 12/09/2011 left humerus.  Findings: The patient was not cooperative.  A single portable view of the left humerus, elbow and forearm was able to be obtained. This limits evaluation for subtle injury.  In this patient who has had a previous comminuted fracture of the proximal left humeral shaft/surgical neck region, there is evidence of an acute comminuted fracture with marked separation of fracture fragments and overriding of the major fracture fragments.  No other obvious fracture noted.  IMPRESSION: The patient was not cooperative.  A single portable view of the left humerus, elbow and forearm was able to be obtained.  This  limits evaluation for subtle injury.  In this patient who has had a previous comminuted fracture of the proximal left humeral shaft/surgical neck region, there is evidence of  an acute comminuted fracture with marked separation of fracture fragments and overriding of the major fracture fragments.  No other obvious fracture noted.   Original Report Authenticated By: Fuller Canada, M.D.     Scheduled Meds:   . folic acid  1 mg Oral Daily  . gemfibrozil  600 mg Oral BID AC  . thiamine  100 mg Oral Daily   Continuous Infusions:   . sodium chloride 1,000 mL (07/04/12 1649)  . sodium chloride 100 mL/hr at 07/05/12 1549    Active Problems:  ETOH abuse  Fall  Left humeral fracture  ARF (acute renal failure)  Confusion    Time spent: 40 minutes   [facility]  [facility] Pager [phone]. If 8PM-8AM, please contact night-coverage at [email or web address], password [facility] 07/06/2012, 9:08 AM  LOS: 2 days

## 2012-07-07 LAB — COMPREHENSIVE METABOLIC PANEL
AST: 32 U/L (ref 0–37)
Albumin: 2.4 g/dL — ABNORMAL LOW (ref 3.5–5.2)
Alkaline Phosphatase: 76 U/L (ref 39–117)
BUN: 12 mg/dL (ref 6–23)
Chloride: 107 mEq/L (ref 96–112)
Potassium: 2.9 mEq/L — ABNORMAL LOW (ref 3.5–5.1)
Total Bilirubin: 1.5 mg/dL — ABNORMAL HIGH (ref 0.3–1.2)

## 2012-07-07 LAB — CBC
HCT: 34.7 % — ABNORMAL LOW (ref 39.0–52.0)
Platelets: 95 10*3/uL — ABNORMAL LOW (ref 150–400)
RBC: 3.47 MIL/uL — ABNORMAL LOW (ref 4.22–5.81)
RDW: 12.8 % (ref 11.5–15.5)
WBC: 7.7 10*3/uL (ref 4.0–10.5)

## 2012-07-07 MED ORDER — FOLIC ACID 1 MG PO TABS: 1.0000 mg | ORAL_TABLET | Freq: Every day | ORAL | Status: DC

## 2012-07-07 MED ORDER — POLYETHYLENE GLYCOL 3350 17 G PO PACK: 17.0000 g | PACK | Freq: Every day | ORAL | Status: DC | PRN

## 2012-07-07 MED ORDER — OXYCODONE HCL 5 MG PO TABS: 5.0000 mg | ORAL_TABLET | ORAL | Status: DC | PRN

## 2012-07-07 MED ORDER — THIAMINE HCL 100 MG PO TABS: 100.0000 mg | ORAL_TABLET | Freq: Every day | ORAL | Status: DC

## 2012-07-07 NOTE — Progress Notes (Signed)
Utilization review completed. Ariauna Farabee, RN, BSN. 

## 2012-07-07 NOTE — Progress Notes (Signed)
[facility] PROGRESS NOTE  Key Brule Carberry QIH:[id number] DOB: 06-Dec-1940 DOA: 07/04/2012 PCP: Michele Mcalpine, MD  Assessment/Plan: Active Problems:  ETOH abuse  Fall  Left humeral fracture  ARF (acute renal failure)  Confusion    1. Left humeral fracture nonoperative management, may need placement, avoid narcotics IV, but does use oxycodone for pain control,Dr. Ranell Patrick will have the patient follow-up in the office in a week discharge to SNF when bed available , social worker consulted 1. Alcohol dependence social work consultation will be obtained for possible placement, poor psychosocial situation, will start the patient on CIWA protocol, start thiamine and folic acid, no signs of withdrawal yet 2. Macrocytic anemia likely secondary to alcohol dependence, stable, repeat CBC today 3. Acute renal insufficiency secondary to rhabdomyolysis/improved, elevated CKs related to the fall, monitor CK, DC IV hydration today 4.  5.  Code Status: full  Family Communication: family updated about patient's clinical progress  Disposition Plan: Discharge to SNF when bed available, patient agreeable   Brief narrative:  71 year old male seen in the [facility] ED today for multiple issues. It is reported in the chart that he had a fall a 'couple of days ago in bathroom and crawled to bedroom. Neighbor came to check on him because he had not seen him in 3 days and pt has significant outward rotation of left arm and shoulder with questionable left humerus deformity with bruising'.  He is a patient of Dr. Beverely Low and is currently being followed for a left humerus fracture. Our last office visit was back on 01/18/12 and was in follow up for his left shoulder proximal humerus fracture. He was supposed to follow back up 4 weeks later for repeat x-rays. That was the last visit we had on record. Our records indicate that he had a fall back in March of this year whre he sustained the left humerus fracture originally.  He was admitted by the Hospital service and later followed up with [facility] thru the month of April 2013.  Upon arrival today, the patient was reported to be confused. He reported to drinking ETOH daily and tells me today that he has already had at least a 6-pack already. He states that he falls and will pass out and that's why he is scared to drive.  He states that the left arm is painful but is a very poor historian and cannot recall any recent events surrounding a fall.    Consultants:  Orthopedics Procedures:  None Antibiotics:  None  HPI/Subjective:  Left shoulder the sling immobilizer, no complaints  Objective: Filed Vitals:   07/06/12 0527 07/06/12 1400 07/06/12 2211 07/07/12 0512  BP: 152/84 132/70 148/85 150/83  Pulse: 86 68 94 94  Temp: 98 F (36.7 C) 97.6 F (36.4 C) 97.3 F (36.3 C) 98.6 F (37 C)  TempSrc:      Resp: 16 20 18 18   SpO2: 100% 95% 100% 100%    Intake/Output Summary (Last 24 hours) at 07/07/12 0857 Last data filed at 07/07/12 8756  Gross per 24 hour  Intake   1220 ml  Output   2500 ml  Net  -1280 ml    Exam:  General appearance: alert, cooperative and no distress  Head: Normocephalic, without obvious abnormality, atraumatic  Eyes: conjunctivae/corneas clear. PERRL, EOM's intact. Fundi benign.  Nose: Nares normal. Septum midline. Mucosa normal. No drainage or sinus tenderness.  Throat: lips, mucosa, and tongue normal; teeth and gums normal  Neck: Supple, no masses, no  cervical lymphadenopathy, no JVD appreciated, no meningeal signs  Resp: clear to auscultation bilaterally  Chest wall: no tenderness  Cardio: regular rate and rhythm, S1, S2 normal, no murmur, click, rub or gallop  GI: soft, non-tender; bowel sounds normal; no masses, no organomegaly  Extremities: There is swelling and bruises on around his left shoulder , left shoulder and the sling Skin: Skin color bruising in the left upper arm,, texture, turgor normal. No rashes or lesions    Neurologic: Alert and disoriented. The rest of the neurological examination was not done because of patient     Data Reviewed: Basic Metabolic Panel:  Lab 07/05/12 7846 07/05/12 0205 07/04/12 1029  NA -- 144 144  K -- 3.6 4.2  CL -- 115* 105  CO2 -- 13* 21  GLUCOSE -- 72 125*  BUN -- 61* 88*  CREATININE -- 1.34 1.99*  CALCIUM -- 8.6 9.5  MG 2.4 -- --  PHOS -- -- --    Liver Function Tests:  Lab 07/05/12 0205 07/04/12 1029  AST 51* 56*  ALT 27 35  ALKPHOS 61 69  BILITOT 1.4* 2.0*  PROT 5.6* 6.7  ALBUMIN 2.8* 3.5   No results found for this basename: LIPASE:5,AMYLASE:5 in the last 168 hours No results found for this basename: AMMONIA:5 in the last 168 hours  CBC:  Lab 07/05/12 0205 07/04/12 1029  WBC 8.4 12.1*  NEUTROABS -- 9.9*  HGB 13.3 14.9  HCT 38.4* 42.2  MCV 104.6* 101.0*  PLT 106* 129*    Cardiac Enzymes:  Lab 07/05/12 1322 07/05/12 0200 07/04/12 2007 07/04/12 1029  CKTOTAL 127 -- -- 516*  CKMB 6.2* -- -- --  CKMBINDEX -- -- -- --  TROPONINI <0.30 <0.30 <0.30 --   BNP (last 3 results) No results found for this basename: PROBNP:3 in the last 8760 hours   CBG: No results found for this basename: GLUCAP:5 in the last 168 hours  Recent Results (from the past 240 hour(s))  URINE CULTURE     Status: Normal   Collection Time   07/04/12  1:56 PM      Component Value Range Status Comment   Specimen Description URINE, CATHETERIZED   Final    Special Requests NONE   Final    Culture  Setup Time 07/04/2012 14:45   Final    Colony Count NO GROWTH   Final    Culture NO GROWTH   Final    Report Status 07/05/2012 FINAL   Final      Studies: Dg Elbow 2 Views Left  07/04/2012  *RADIOLOGY REPORT*  Clinical Data: Fall.  Combative.  LEFT HUMERUS - 2+ VIEW,LEFT ELBOW - 2 VIEW,LEFT FOREARM - 2 VIEW  Comparison: 12/09/2011 left humerus.  Findings: The patient was not cooperative.  A single portable view of the left humerus, elbow and forearm was able to be  obtained. This limits evaluation for subtle injury.  In this patient who has had a previous comminuted fracture of the proximal left humeral shaft/surgical neck region, there is evidence of an acute comminuted fracture with marked separation of fracture fragments and overriding of the major fracture fragments.  No other obvious fracture noted.  IMPRESSION: The patient was not cooperative.  A single portable view of the left humerus, elbow and forearm was able to be obtained.  This limits evaluation for subtle injury.  In this patient who has had a previous comminuted fracture of the proximal left humeral shaft/surgical neck region, there is evidence of an  acute comminuted fracture with marked separation of fracture fragments and overriding of the major fracture fragments.  No other obvious fracture noted.   Original Report Authenticated By: Fuller Canada, M.D.    Dg Forearm Left  07/04/2012  *RADIOLOGY REPORT*  Clinical Data: Fall.  Combative.  LEFT HUMERUS - 2+ VIEW,LEFT ELBOW - 2 VIEW,LEFT FOREARM - 2 VIEW  Comparison: 12/09/2011 left humerus.  Findings: The patient was not cooperative.  A single portable view of the left humerus, elbow and forearm was able to be obtained. This limits evaluation for subtle injury.  In this patient who has had a previous comminuted fracture of the proximal left humeral shaft/surgical neck region, there is evidence of an acute comminuted fracture with marked separation of fracture fragments and overriding of the major fracture fragments.  No other obvious fracture noted.  IMPRESSION: The patient was not cooperative.  A single portable view of the left humerus, elbow and forearm was able to be obtained.  This limits evaluation for subtle injury.  In this patient who has had a previous comminuted fracture of the proximal left humeral shaft/surgical neck region, there is evidence of an acute comminuted fracture with marked separation of fracture fragments and overriding of the  major fracture fragments.  No other obvious fracture noted.   Original Report Authenticated By: Fuller Canada, M.D.    Ct Head Wo Contrast  07/04/2012  *RADIOLOGY REPORT*  Clinical Data:  Fall.  CT HEAD WITHOUT CONTRAST CT CERVICAL SPINE WITHOUT CONTRAST  Technique:  Multidetector CT imaging of the head and cervical spine was performed following the standard protocol without intravenous contrast.  Multiplanar CT image reconstructions of the cervical spine were also generated.  Comparison:  05/23/2009.  CT HEAD  Findings: Prior orbital fracture with surgery.  No acute skull fracture.  No intracranial hemorrhage.  Global atrophy.  Ventricles slightly more prominent than on the prior examination which may be related to atrophy rather than hydrocephalus.  Small vessel disease type changes without CT evidence of large acute infarct.  No intracranial mass lesion detected on this unenhanced exam.  IMPRESSION: No acute skull fracture or intracranial hemorrhage.  Global atrophy.  Ventricular prominence more notable than on the prior exam which may be related to atrophy rather hydrocephalus.  Small vessel disease type changes without CT evidence of large acute infarct.  CT CERVICAL SPINE  Findings: No cervical spine fracture or malalignment.  Cervical spondylotic changes with spinal stenosis most notable C3- 4, C4-5 and C5-6.  Focal ossification of the posterior longitudinal ligament posterior to the C6 vertebra contributes to spinal stenosis centrally and is without change.  Left sided C6-7 disc protrusion left sided spinal stenosis.  1 cm left thyroid lesion.  This can be followed with thyroid ultrasound.  Carotid bifurcation calcifications.  Slight increase in size of slightly septated fatty lesion within the posterior neck soft tissue at the C4-5 level measuring 4.6 x 2.9 x 2.3 cm.  This can be followed with contrast enhanced soft tissue MRI on elective basis.  IMPRESSION: No cervical spine fracture or malalignment.   Cervical spondylotic changes with spinal stenosis as noted above.  1 cm left thyroid lesion.  This can be followed with thyroid ultrasound.  Slight increase in size of slightly septated fatty lesion within the posterior neck soft tissue at the C4-5 level measuring 4.6 x 2.9 x 2.3 cm.  This can be followed with contrast enhanced soft tissue MRI on elective basis.   Original Report Authenticated By:  Fuller Canada, M.D.    Ct Cervical Spine Wo Contrast  07/04/2012  *RADIOLOGY REPORT*  Clinical Data:  Fall.  CT HEAD WITHOUT CONTRAST CT CERVICAL SPINE WITHOUT CONTRAST  Technique:  Multidetector CT imaging of the head and cervical spine was performed following the standard protocol without intravenous contrast.  Multiplanar CT image reconstructions of the cervical spine were also generated.  Comparison:  05/23/2009.  CT HEAD  Findings: Prior orbital fracture with surgery.  No acute skull fracture.  No intracranial hemorrhage.  Global atrophy.  Ventricles slightly more prominent than on the prior examination which may be related to atrophy rather than hydrocephalus.  Small vessel disease type changes without CT evidence of large acute infarct.  No intracranial mass lesion detected on this unenhanced exam.  IMPRESSION: No acute skull fracture or intracranial hemorrhage.  Global atrophy.  Ventricular prominence more notable than on the prior exam which may be related to atrophy rather hydrocephalus.  Small vessel disease type changes without CT evidence of large acute infarct.  CT CERVICAL SPINE  Findings: No cervical spine fracture or malalignment.  Cervical spondylotic changes with spinal stenosis most notable C3- 4, C4-5 and C5-6.  Focal ossification of the posterior longitudinal ligament posterior to the C6 vertebra contributes to spinal stenosis centrally and is without change.  Left sided C6-7 disc protrusion left sided spinal stenosis.  1 cm left thyroid lesion.  This can be followed with thyroid ultrasound.   Carotid bifurcation calcifications.  Slight increase in size of slightly septated fatty lesion within the posterior neck soft tissue at the C4-5 level measuring 4.6 x 2.9 x 2.3 cm.  This can be followed with contrast enhanced soft tissue MRI on elective basis.  IMPRESSION: No cervical spine fracture or malalignment.  Cervical spondylotic changes with spinal stenosis as noted above.  1 cm left thyroid lesion.  This can be followed with thyroid ultrasound.  Slight increase in size of slightly septated fatty lesion within the posterior neck soft tissue at the C4-5 level measuring 4.6 x 2.9 x 2.3 cm.  This can be followed with contrast enhanced soft tissue MRI on elective basis.   Original Report Authenticated By: Fuller Canada, M.D.    Dg Chest Portable 1 View  07/04/2012  *RADIOLOGY REPORT*  Clinical Data: Altered mental status, fall, history COPD, smoking, hypertension, diabetes  PORTABLE CHEST - 1 VIEW  Comparison: Portable exam 1021 hours compared to 12/10/2011  Findings: Normal heart size, mediastinal contours, and pulmonary vascularity. Numerous cardiac monitoring leads project over chest. Lungs grossly clear. No pleural effusion or pneumothorax. Old healed bilateral inferior rib fractures. Osseous demineralization. Age indeterminate fracture at a lateral inferior right rib.  IMPRESSION: Multiple old bilateral rib fractures with an age indeterminate fracture a lateral inferior right rib, approximately eighth. No acute pulmonary abnormalities.   Original Report Authenticated By: Lollie Marrow, M.D.    Dg Humerus Left  07/04/2012  *RADIOLOGY REPORT*  Clinical Data: Fall.  Combative.  LEFT HUMERUS - 2+ VIEW,LEFT ELBOW - 2 VIEW,LEFT FOREARM - 2 VIEW  Comparison: 12/09/2011 left humerus.  Findings: The patient was not cooperative.  A single portable view of the left humerus, elbow and forearm was able to be obtained. This limits evaluation for subtle injury.  In this patient who has had a previous comminuted  fracture of the proximal left humeral shaft/surgical neck region, there is evidence of an acute comminuted fracture with marked separation of fracture fragments and overriding of the major fracture fragments.  No other obvious fracture noted.  IMPRESSION: The patient was not cooperative.  A single portable view of the left humerus, elbow and forearm was able to be obtained.  This limits evaluation for subtle injury.  In this patient who has had a previous comminuted fracture of the proximal left humeral shaft/surgical neck region, there is evidence of an acute comminuted fracture with marked separation of fracture fragments and overriding of the major fracture fragments.  No other obvious fracture noted.   Original Report Authenticated By: Fuller Canada, M.D.     Scheduled Meds:   . folic acid  1 mg Oral Daily  . gemfibrozil  600 mg Oral BID AC  . thiamine  100 mg Oral Daily   Continuous Infusions:   . DISCONTD: sodium chloride 1,000 mL (07/04/12 1649)  . DISCONTD: sodium chloride 100 mL/hr (07/07/12 0017)    Active Problems:  ETOH abuse  Fall  Left humeral fracture  ARF (acute renal failure)  Confusion    Time spent: 40 minutes   [facility]  [facility] Pager [phone]. If 8PM-8AM, please contact night-coverage at [email or web address], password [facility] 07/07/2012, 8:57 AM  LOS: 3 days

## 2012-07-07 NOTE — Progress Notes (Signed)
CARE MANAGEMENT NOTE 07/07/2012  Patient:  ACXEL, DINGEE   Account Number:  [id number]  Date Initiated:  07/05/2012  Documentation initiated by:  Vance Peper  Subjective/Objective Assessment:   71 yr old male s/p left humerus fracture     Action/Plan:   Progression of Care and Home Health needs. CM spoke with patient to offer choice. Patient lives alone, states he has a friend that checks on him and cuts lawn. Pt Blind in right eye.  Pt is for SNF for shortterm rehab.   Anticipated DC Date:  07/08/2012   Anticipated DC Plan:  SKILLED NURSING FACILITY  In-house referral  Clinical Social Worker      DC Planning Services  CM consult      Choice offered to / List presented to:  C-1 Patient           Status of service:  Completed, signed off Medicare Important Message given?   (If response is "NO", the following Medicare IM given date fields will be blank) Date Medicare IM given:   Date Additional Medicare IM given:    Discharge Disposition:  SKILLED NURSING FACILITY  Per UR Regulation:    If discussed at Long Length of Stay Meetings, dates discussed:    Comments:  07/07/12 11:45 Vance Peper, RN BSN Case Manager CM Anette Guarneri received call from EHR stating patient is inpatient appropriate. Patient will go to SNF for shortterm rehab. Lives alone.

## 2012-07-07 NOTE — Discharge Summary (Signed)
Assessment/Plan: Active Problems:  ETOH abuse  Fall  Left humeral fracture  ARF (acute renal failure)  Confusion   Hospital course  1. Left humeral fracture nonoperative management per orthopedics, awaiting placement, IV narcotics were minimized, but does use oxycodone for pain control,Dr. Ranell Patrick will have the patient follow-up in the office in a week discharge to SNF when bed available , social worker consulted  Alcohol dependence social work consultation will be obtained for possible placement, poor psychosocial situation, patient was started on CIWA protocol, no signs of withdrawal, continue thiamine and folic acid, no signs of withdrawal yet   Macrocytic anemia /thrombocytopenia secondary to alcoholdependence, stable, repeat CBC today, monitor biweekly CBC  Acute renal insufficiency secondary to rhabdomyolysis/improved, elevated CKs related to the fall, now improved, IV hydration was discontinued    Code Status: full   Family Communication: family updated about patient's clinical progress   Disposition Plan: Discharge to SNF when bed available, patient agreeable     History of present illness 71 year old male seen in the [facility] ED today for multiple issues. It is reported in the chart that he had a fall a 'couple of days ago in bathroom and crawled to bedroom. Neighbor came to check on him because he had not seen him in 3 days and pt has significant outward rotation of left arm and shoulder with questionable left humerus deformity with bruising'.   He is a patient of Dr. Beverely Low and is currently being followed for a left humerus fracture. Per orthopedics, his last visit was back on 01/18/12 and was in follow up for his left shoulder proximal humerus fracture. He was supposed to follow back up 4 weeks later for repeat x-rays. Per orthopedic records  he had a fall back in March of this year where he sustained the left humerus fracture originally. He was admitted by the Hospital  service and later followed up with [facility] thru the month of April 2013.   Upon arrival this time, the patient was reported to be confused. He reported to drinking ETOH daily and reportedly drinks at least a 6-pack already. He states that he falls and will pass out and that's why he is scared to drive.   He states that the left arm is painful but is a very poor historian and cannot recall any recent events surrounding a fall.      Consultants:  Orthopedics   Procedures:  None Antibiotics:   None   HPI/Subjective:   Left shoulder the sling immobilizer, no complaints   Objective: Filed Vitals:     07/06/12 0527  07/06/12 1400  07/06/12 2211  07/07/12 0512   BP:  152/84  132/70  148/85  150/83   Pulse:  86  68  94  94   Temp:  98 F (36.7 C)  97.6 F (36.4 C)  97.3 F (36.3 C)  98.6 F (37 C)   TempSrc:           Resp:  16  20  18  18    SpO2:  100%  95%  100%  100%    Intake/Output Summary (Last 24 hours) at 07/07/12 0857 Last data filed at 07/07/12 1610   Gross per 24 hour   Intake    1220 ml   Output    2500 ml   Net   -1280 ml    Exam:    General appearance: alert, cooperative and no distress   Head: Normocephalic, without obvious abnormality,  atraumatic   Eyes: conjunctivae/corneas clear. PERRL, EOM's intact. Fundi benign.   Nose: Nares normal. Septum midline. Mucosa normal. No drainage or sinus tenderness.   Throat: lips, mucosa, and tongue normal; teeth and gums normal   Neck: Supple, no masses, no cervical lymphadenopathy, no JVD appreciated, no meningeal signs   Resp: clear to auscultation bilaterally   Chest wall: no tenderness   Cardio: regular rate and rhythm, S1, S2 normal, no murmur, click, rub or gallop   GI: soft, non-tender; bowel sounds normal; no masses, no organomegaly   Extremities: There is swelling and bruises on around his left shoulder , left shoulder and the sling Skin: Skin color bruising in the left upper arm,, texture, turgor normal. No rashes  or lesions   Neurologic: Alert and disoriented. The rest of the neurological examination was not done because of patient       Data Reviewed: Basic Metabolic Panel: Lab  07/05/12 8469  07/05/12 0205  07/04/12 1029   NA  --  144  144   K  --  3.6  4.2   CL  --  115*  105   CO2  --  13*  21   GLUCOSE  --  72  125*   BUN  --  61*  88*   CREATININE  --  1.34  1.99*   CALCIUM  --  8.6  9.5   MG  2.4  --  --   PHOS  --  --  --      Liver Function Tests: Lab  07/05/12 0205  07/04/12 1029   AST  51*  56*   ALT  27  35   ALKPHOS  61  69   BILITOT  1.4*  2.0*   PROT  5.6*  6.7   ALBUMIN  2.8*  3.5    No results found for this basename: LIPASE:5,AMYLASE:5 in the last 168 hours No results found for this basename: AMMONIA:5 in the last 168 hours   CBC: Lab  07/05/12 0205  07/04/12 1029   WBC  8.4  12.1*   NEUTROABS  --  9.9*   HGB  13.3  14.9   HCT  38.4*  42.2   MCV  104.6*  101.0*   PLT  106*  129*      Cardiac Enzymes: Lab  07/05/12 1322  07/05/12 0200  07/04/12 2007  07/04/12 1029   CKTOTAL  127  --  --  516*   CKMB  6.2*  --  --  --   CKMBINDEX  --  --  --  --   TROPONINI  <0.30  <0.30  <0.30  --    BNP (last 3 results) No results found for this basename: PROBNP:3 in the last 8760 hours     CBG: No results found for this basename: GLUCAP:5 in the last 168 hours    Recent Results (from the past 240 hour(s))   URINE CULTURE     Status: Normal     Collection Time     07/04/12  1:56 PM       Component  Value  Range  Status  Comment     Specimen Description  URINE, CATHETERIZED     Final       Special Requests  NONE     Final       Culture  Setup Time  07/04/2012 14:45     Final       Colony Count  NO  GROWTH     Final       Culture  NO GROWTH     Final       Report Status  07/05/2012 FINAL     Final          Studies: Dg Elbow 2 Views Left   2012/07/19  *RADIOLOGY REPORT*  Clinical Data: Fall.  Combative.  LEFT HUMERUS - 2+ VIEW,LEFT ELBOW - 2  VIEW,LEFT FOREARM - 2 VIEW  Comparison: 12/09/2011 left humerus.  Findings: The patient was not cooperative.  A single portable view of the left humerus, elbow and forearm was able to be obtained. This limits evaluation for subtle injury.  In this patient who has had a previous comminuted fracture of the proximal left humeral shaft/surgical neck region, there is evidence of an acute comminuted fracture with marked separation of fracture fragments and overriding of the major fracture fragments.  No other obvious fracture noted.  IMPRESSION: The patient was not cooperative.  A single portable view of the left humerus, elbow and forearm was able to be obtained.  This limits evaluation for subtle injury.  In this patient who has had a previous comminuted fracture of the proximal left humeral shaft/surgical neck region, there is evidence of an acute comminuted fracture with marked separation of fracture fragments and overriding of the major fracture fragments.  No other obvious fracture noted.   Original Report Authenticated By: Fuller Canada, M.D.     Dg Forearm Left   2012/07/19  *RADIOLOGY REPORT*  Clinical Data: Fall.  Combative.  LEFT HUMERUS - 2+ VIEW,LEFT ELBOW - 2 VIEW,LEFT FOREARM - 2 VIEW  Comparison: 12/09/2011 left humerus.  Findings: The patient was not cooperative.  A single portable view of the left humerus, elbow and forearm was able to be obtained. This limits evaluation for subtle injury.  In this patient who has had a previous comminuted fracture of the proximal left humeral shaft/surgical neck region, there is evidence of an acute comminuted fracture with marked separation of fracture fragments and overriding of the major fracture fragments.  No other obvious fracture noted.  IMPRESSION: The patient was not cooperative.  A single portable view of the left humerus, elbow and forearm was able to be obtained.  This limits evaluation for subtle injury.  In this patient who has had a previous  comminuted fracture of the proximal left humeral shaft/surgical neck region, there is evidence of an acute comminuted fracture with marked separation of fracture fragments and overriding of the major fracture fragments.  No other obvious fracture noted.   Original Report Authenticated By: Fuller Canada, M.D.     Ct Head Wo Contrast   Jul 19, 2012  *RADIOLOGY REPORT*  Clinical Data:  Fall.  CT HEAD WITHOUT CONTRAST CT CERVICAL SPINE WITHOUT CONTRAST  Technique:  Multidetector CT imaging of the head and cervical spine was performed following the standard protocol without intravenous contrast.  Multiplanar CT image reconstructions of the cervical spine were also generated.  Comparison:  05/23/2009.  CT HEAD  Findings: Prior orbital fracture with surgery.  No acute skull fracture.  No intracranial hemorrhage.  Global atrophy.  Ventricles slightly more prominent than on the prior examination which may be related to atrophy rather than hydrocephalus.  Small vessel disease type changes without CT evidence of large acute infarct.  No intracranial mass lesion detected on this unenhanced exam.  IMPRESSION: No acute skull fracture or intracranial hemorrhage.  Global atrophy.  Ventricular prominence more notable than on the prior  exam which may be related to atrophy rather hydrocephalus.  Small vessel disease type changes without CT evidence of large acute infarct.  CT CERVICAL SPINE Findings: No cervical spine fracture or malalignment.  Cervical spondylotic changes with spinal stenosis most notable C3- 4, C4-5 and C5-6.  Focal ossification of the posterior longitudinal ligament posterior to the C6 vertebra contributes to spinal stenosis centrally and is without change.  Left sided C6-7 disc protrusion left sided spinal stenosis.  1 cm left thyroid lesion.  This can be followed with thyroid ultrasound.  Carotid bifurcation calcifications.  Slight increase in size of slightly septated fatty lesion within the posterior neck  soft tissue at the C4-5 level measuring 4.6 x 2.9 x 2.3 cm.  This can be followed with contrast enhanced soft tissue MRI on elective basis.  IMPRESSION: No cervical spine fracture or malalignment.  Cervical spondylotic changes with spinal stenosis as noted above.  1 cm left thyroid lesion.  This can be followed with thyroid ultrasound.  Slight increase in size of slightly septated fatty lesion within the posterior neck soft tissue at the C4-5 level measuring 4.6 x 2.9 x 2.3 cm.  This can be followed with contrast enhanced soft tissue MRI on elective basis.   Original Report Authenticated By: Fuller Canada, M.D.     Ct Cervical Spine Wo Contrast   07/04/2012  *RADIOLOGY REPORT*  Clinical Data:  Fall.  CT HEAD WITHOUT CONTRAST CT CERVICAL SPINE WITHOUT CONTRAST  Technique:  Multidetector CT imaging of the head and cervical spine was performed following the standard protocol without intravenous contrast.  Multiplanar CT image reconstructions of the cervical spine were also generated.  Comparison:  05/23/2009.  CT HEAD  Findings: Prior orbital fracture with surgery.  No acute skull fracture.  No intracranial hemorrhage.  Global atrophy.  Ventricles slightly more prominent than on the prior examination which may be related to atrophy rather than hydrocephalus.  Small vessel disease type changes without CT evidence of large acute infarct.  No intracranial mass lesion detected on this unenhanced exam.  IMPRESSION: No acute skull fracture or intracranial hemorrhage.  Global atrophy.  Ventricular prominence more notable than on the prior exam which may be related to atrophy rather hydrocephalus.  Small vessel disease type changes without CT evidence of large acute infarct.  CT CERVICAL SPINE Findings: No cervical spine fracture or malalignment.  Cervical spondylotic changes with spinal stenosis most notable C3- 4, C4-5 and C5-6.  Focal ossification of the posterior longitudinal ligament posterior to the C6 vertebra  contributes to spinal stenosis centrally and is without change.  Left sided C6-7 disc protrusion left sided spinal stenosis.  1 cm left thyroid lesion.  This can be followed with thyroid ultrasound.  Carotid bifurcation calcifications.  Slight increase in size of slightly septated fatty lesion within the posterior neck soft tissue at the C4-5 level measuring 4.6 x 2.9 x 2.3 cm.  This can be followed with contrast enhanced soft tissue MRI on elective basis.  IMPRESSION: No cervical spine fracture or malalignment.  Cervical spondylotic changes with spinal stenosis as noted above.  1 cm left thyroid lesion.  This can be followed with thyroid ultrasound.  Slight increase in size of slightly septated fatty lesion within the posterior neck soft tissue at the C4-5 level measuring 4.6 x 2.9 x 2.3 cm.  This can be followed with contrast enhanced soft tissue MRI on elective basis.   Original Report Authenticated By: Fuller Canada, M.D.  Dg Chest Portable 1 View   07/04/2012  *RADIOLOGY REPORT*  Clinical Data: Altered mental status, fall, history COPD, smoking, hypertension, diabetes  PORTABLE CHEST - 1 VIEW  Comparison: Portable exam 1021 hours compared to 12/10/2011  Findings: Normal heart size, mediastinal contours, and pulmonary vascularity. Numerous cardiac monitoring leads project over chest. Lungs grossly clear. No pleural effusion or pneumothorax. Old healed bilateral inferior rib fractures. Osseous demineralization. Age indeterminate fracture at a lateral inferior right rib.  IMPRESSION: Multiple old bilateral rib fractures with an age indeterminate fracture a lateral inferior right rib, approximately eighth. No acute pulmonary abnormalities.   Original Report Authenticated By: Lollie Marrow, M.D.     Dg Humerus Left   07/04/2012  *RADIOLOGY REPORT*  Clinical Data: Fall.  Combative.  LEFT HUMERUS - 2+ VIEW,LEFT ELBOW - 2 VIEW,LEFT FOREARM - 2 VIEW  Comparison: 12/09/2011 left humerus.  Findings: The  patient was not cooperative.  A single portable view of the left humerus, elbow and forearm was able to be obtained. This limits evaluation for subtle injury.  In this patient who has had a previous comminuted fracture of the proximal left humeral shaft/surgical neck region, there is evidence of an acute comminuted fracture with marked separation of fracture fragments and overriding of the major fracture fragments.  No other obvious fracture noted.  IMPRESSION: The patient was not cooperative.  A single portable view of the left humerus, elbow and forearm was able to be obtained.  This limits evaluation for subtle injury.  In this patient who has had a previous comminuted fracture of the proximal left humeral shaft/surgical neck region, there is evidence of an acute comminuted fracture with marked separation of fracture fragments and overriding of the major fracture fragments.  No other obvious fracture noted.   Original Report Authenticated By: Fuller Canada, M.D.        Scheduled Meds:     .  folic acid   1 mg  Oral  Daily   .  gemfibrozil   600 mg  Oral  BID AC   .  thiamine   100 mg  Oral  Daily    Continuous Infusions:     .  DISCONTD: sodium chloride  1,000 mL (07/04/12 1649)   .  DISCONTD: sodium chloride  100 mL/hr (07/07/12 0017)      Active Problems:  ETOH abuse  Fall  Left humeral fracture  ARF (acute renal failure)  Confusion     Time spent: 40 minutes     [facility]          [facility] Pager [phone]. If 8PM-8AM, please contact night-coverage at [email or web address], password [facility] 07/07/2012, 8:57 AM LOS: 3 days

## 2012-09-19 ENCOUNTER — Telehealth: Payer: Self-pay | Admitting: Pulmonary Disease

## 2012-09-19 NOTE — Telephone Encounter (Signed)
Ok to add pt on 1-2 at 2pm.  Pt will need to bring these forms with him to this appt. thanks

## 2012-09-19 NOTE — Telephone Encounter (Signed)
Pt aware of appt date/time. Nothing further was needed

## 2012-09-19 NOTE — Telephone Encounter (Signed)
Patient states [employer] forms need to be filled out by 10/01/11.  I explained to patient that SN does not have anything available w/ SN until 1/27.  He states this is very important and wants to speak to [facility] nurse.

## 2012-09-19 NOTE — Telephone Encounter (Signed)
lmomtcb x1 for pt to schedule OV w/ SN

## 2012-09-19 NOTE — Telephone Encounter (Signed)
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  thanks

## 2012-09-19 NOTE — Telephone Encounter (Signed)
I spoke with pt. 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 Does he need OV with SN or can he drop these off. Please advise thanks Last OV 06/15/12 Pending OV 12/13/12

## 2012-09-19 NOTE — Telephone Encounter (Signed)
Blake [name] can pt be worked in sooner. Please advise thanks

## 2012-09-28 ENCOUNTER — Encounter: Payer: Self-pay | Admitting: Pulmonary Disease

## 2012-09-28 ENCOUNTER — Ambulatory Visit (INDEPENDENT_AMBULATORY_CARE_PROVIDER_SITE_OTHER): Payer: Medicare Other | Admitting: Pulmonary Disease

## 2012-09-28 ENCOUNTER — Other Ambulatory Visit (INDEPENDENT_AMBULATORY_CARE_PROVIDER_SITE_OTHER): Payer: Medicare Other

## 2012-09-28 VITALS — BP 120/80 | HR 62 | Temp 98.3°F | Ht 70.5 in | Wt 175.8 lb

## 2012-09-28 DIAGNOSIS — F17200 Nicotine dependence, unspecified, uncomplicated: Secondary | ICD-10-CM

## 2012-09-28 DIAGNOSIS — W19XXXA Unspecified fall, initial encounter: Secondary | ICD-10-CM

## 2012-09-28 DIAGNOSIS — I10 Essential (primary) hypertension: Secondary | ICD-10-CM

## 2012-09-28 DIAGNOSIS — K5730 Diverticulosis of large intestine without perforation or abscess without bleeding: Secondary | ICD-10-CM

## 2012-09-28 DIAGNOSIS — J449 Chronic obstructive pulmonary disease, unspecified: Secondary | ICD-10-CM

## 2012-09-28 DIAGNOSIS — D126 Benign neoplasm of colon, unspecified: Secondary | ICD-10-CM

## 2012-09-28 DIAGNOSIS — R972 Elevated prostate specific antigen [PSA]: Secondary | ICD-10-CM

## 2012-09-28 DIAGNOSIS — I679 Cerebrovascular disease, unspecified: Secondary | ICD-10-CM

## 2012-09-28 DIAGNOSIS — K219 Gastro-esophageal reflux disease without esophagitis: Secondary | ICD-10-CM

## 2012-09-28 DIAGNOSIS — G319 Degenerative disease of nervous system, unspecified: Secondary | ICD-10-CM

## 2012-09-28 DIAGNOSIS — E785 Hyperlipidemia, unspecified: Secondary | ICD-10-CM

## 2012-09-28 LAB — BASIC METABOLIC PANEL
BUN: 21 mg/dL (ref 6–23)
Calcium: 9.9 mg/dL (ref 8.4–10.5)
Creatinine, Ser: 0.9 mg/dL (ref 0.4–1.5)
GFR: 93.07 mL/min (ref >60.00)
Glucose, Bld: 105 mg/dL — ABNORMAL HIGH (ref 70–99)

## 2012-09-28 LAB — CBC WITH DIFFERENTIAL/PLATELET
Basophils Absolute: 0 10*3/uL (ref 0.0–0.1)
Eosinophils Absolute: 0.1 10*3/uL (ref 0.0–0.7)
HCT: 40.7 % (ref 39.0–52.0)
Hemoglobin: 14.1 g/dL (ref 13.0–17.0)
Lymphocytes Relative: 22.2 % (ref 12.0–46.0)
Lymphs Abs: 1.8 10*3/uL (ref 0.7–4.0)
MCHC: 34.7 g/dL (ref 30.0–36.0)
Monocytes Relative: 14.7 % — ABNORMAL HIGH (ref 3.0–12.0)
Neutro Abs: 5 10*3/uL (ref 1.4–7.7)
Platelets: 215 10*3/uL (ref 150.0–400.0)
RDW: 14.4 % (ref 11.5–14.6)
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
## 2012-09-28 NOTE — Patient Instructions (Addendum)
Today we updated your med list in our EPIC system...    Continue your current medications the same...  Today we did your follow up metabolic labs (to check your potassium)...    We will contact you w/ the results...  We completed the DMV form today & we will fax it to [facility] ASAP...  Be sure to get the EYE section completed by your eye doctor...  Call for any problems...  Let's plan a follow up visit in 3-4 months... W/ FASTING blood work at that time.Marland KitchenMarland Kitchen

## 2012-09-30 ENCOUNTER — Encounter: Payer: Self-pay | Admitting: Pulmonary Disease

## 2012-10-19 ENCOUNTER — Other Ambulatory Visit: Payer: Self-pay | Admitting: Pulmonary Disease

## 2012-10-19 MED ORDER — TEMAZEPAM 30 MG PO CAPS: 30.0000 mg | ORAL_CAPSULE | Freq: Every evening | ORAL | Status: DC | PRN

## 2012-10-19 NOTE — Telephone Encounter (Signed)
rx has been faxed [facility].  Nothing further is needed

## 2012-10-23 ENCOUNTER — Telehealth: Payer: Self-pay | Admitting: Pulmonary Disease

## 2012-10-23 MED ORDER — TEMAZEPAM 30 MG PO CAPS: 30.0000 mg | ORAL_CAPSULE | Freq: Every evening | ORAL | Status: DC | PRN

## 2012-10-23 NOTE — Telephone Encounter (Signed)
Rx for temazepam was called to pharm and the pt was made aware

## 2012-10-23 NOTE — Telephone Encounter (Signed)
Pt is requesting an rx for temazepam be sent to [facility] for a 90 day supply. Pt states that he never received the rx that shows was sent on 10-19-12. LAst OV on 09-28-12.  Please advise if ok to send. Carron Curie, CMA

## 2012-10-23 NOTE — Telephone Encounter (Signed)
Per SN----ok to send in temazepam 30 mg   #30  1 po qhs.  Not able to send in refill for #90.  thanks

## 2012-10-26 ENCOUNTER — Telehealth: Payer: Self-pay | Admitting: Pulmonary Disease

## 2012-10-26 DIAGNOSIS — M542 Cervicalgia: Secondary | ICD-10-CM

## 2012-10-26 MED ORDER — OXYCODONE HCL 5 MG PO TABS: 5.0000 mg | ORAL_TABLET | Freq: Three times a day (TID) | ORAL | Status: DC | PRN

## 2012-10-26 NOTE — Telephone Encounter (Signed)
Pt is requesting a refill for Oxycodone 5mg  or something stronger to help with his arthritis pain in his neck. Pt states he can hardly move his neck wince the weather has turned so cold. He has been taking the Oxycodone, 2 tablets at a time and this barely eases the pain. He says he is in [place] now for an eye appt and would like to come by to pick up an RX ASAP. Pls advise.No Known Allergies

## 2012-10-26 NOTE — Telephone Encounter (Signed)
Per SN---ok to print rx for the oxycodone 5 mg   And this has been printed and given to the pt.  Pt will need to have mri cspine  And then once we get these results we will refer to neurosurgeon.  Pt is aware and will schedule the MRI.

## 2012-10-28 ENCOUNTER — Ambulatory Visit (HOSPITAL_COMMUNITY): Admission: RE | Admit: 2012-10-28 | Payer: Medicare Other | Source: Ambulatory Visit

## 2012-10-31 ENCOUNTER — Telehealth: Payer: Self-pay | Admitting: Pulmonary Disease

## 2012-10-31 NOTE — Telephone Encounter (Signed)
I provided [facility] number for the pt caregiver to reschedule MRI. Carron Curie, CMA

## 2012-11-01 ENCOUNTER — Ambulatory Visit (HOSPITAL_COMMUNITY)
Admission: RE | Admit: 2012-11-01 | Discharge: 2012-11-01 | Disposition: A | Discharge status: Home / Self Care | Payer: Medicare Other | Source: Ambulatory Visit | Attending: Pulmonary Disease | Admitting: Pulmonary Disease

## 2012-11-01 ENCOUNTER — Other Ambulatory Visit: Payer: Self-pay | Admitting: Pulmonary Disease

## 2012-11-01 DIAGNOSIS — M542 Cervicalgia: Secondary | ICD-10-CM

## 2012-11-01 DIAGNOSIS — R220 Localized swelling, mass and lump, head: Secondary | ICD-10-CM | POA: Insufficient documentation

## 2012-11-01 MED ORDER — GADOBENATE DIMEGLUMINE 529 MG/ML IV SOLN
16.0000 mL | Freq: Once | INTRAVENOUS | Status: AC | PRN
  Administered 2012-11-01: 16 mL via INTRAVENOUS

## 2012-11-07 ENCOUNTER — Telehealth: Payer: Self-pay | Admitting: Pulmonary Disease

## 2012-11-07 DIAGNOSIS — D170 Benign lipomatous neoplasm of skin and subcutaneous tissue of head, face and neck: Secondary | ICD-10-CM

## 2012-11-07 NOTE — Telephone Encounter (Signed)
Notes Recorded by Michele Mcalpine, MD on 11/03/2012 at 3:10 PM Please notify patient>  They only did MRI of the soft tissue- ie the fatty tumor in post neck... It appears indeed to be a benign Lipoma- let us know if he wants referral to surgeon for excision   The pt is aware of results per SN and does want to see a surgeon for excision of the Lipoma. Order placed.

## 2012-11-08 ENCOUNTER — Other Ambulatory Visit: Payer: Self-pay | Admitting: Pulmonary Disease

## 2012-11-10 ENCOUNTER — Other Ambulatory Visit: Payer: Self-pay | Admitting: Pulmonary Disease

## 2012-11-13 ENCOUNTER — Telehealth: Payer: Self-pay | Admitting: Pulmonary Disease

## 2012-11-13 ENCOUNTER — Other Ambulatory Visit: Payer: Self-pay | Admitting: Pulmonary Disease

## 2012-11-13 MED ORDER — TEMAZEPAM 30 MG PO CAPS: 30.0000 mg | ORAL_CAPSULE | Freq: Every evening | ORAL | Status: DC | PRN

## 2012-11-13 NOTE — Telephone Encounter (Signed)
Per SN---can only fill for #30  At local pharmacy.   Pt is aware and i have called this to his local pharmacy

## 2012-11-13 NOTE — Telephone Encounter (Signed)
Pt requesting 90 day supply of temazepam 30 mg tablets sent to [facility] Last rx given 10/23/12 # 30 tablets no refills Last ov with SN 09/28/12 Next ov with SN 02/02/13

## 2012-11-14 ENCOUNTER — Ambulatory Visit (INDEPENDENT_AMBULATORY_CARE_PROVIDER_SITE_OTHER): Payer: Medicare Other | Admitting: Surgery

## 2012-11-15 ENCOUNTER — Ambulatory Visit (INDEPENDENT_AMBULATORY_CARE_PROVIDER_SITE_OTHER): Payer: Medicare Other | Admitting: General Surgery

## 2012-11-27 ENCOUNTER — Telehealth: Payer: Self-pay | Admitting: Pulmonary Disease

## 2012-11-27 MED ORDER — OXYCODONE HCL 5 MG PO TABS: 5.0000 mg | ORAL_TABLET | Freq: Three times a day (TID) | ORAL | Status: DC | PRN

## 2012-11-27 NOTE — Telephone Encounter (Signed)
Refill rx of the oxycodone has been printed out and placed on SN cart to be signed.

## 2012-11-27 NOTE — Telephone Encounter (Signed)
Pt's caregiver called back. She will only be in [place] a [place] time now and asks that this be ready asap. I advised her to wait for nurse's call. Blake [name]

## 2012-11-27 NOTE — Telephone Encounter (Signed)
Spoke with Blake [name] and notified that rx is up front for pick up She verbalized understanding and states nothing further needed

## 2012-12-06 ENCOUNTER — Telehealth: Payer: Self-pay | Admitting: Pulmonary Disease

## 2012-12-06 NOTE — Telephone Encounter (Signed)
Spoke with pt He is requesting a 90 day supply of temazepam sent to [facility] since he can get this cheaper there We had called in # 30 with 5 rf on 11/13/12  Last ov 09/28/12 Next ov 02/02/13 Please advise thanks!

## 2012-12-08 MED ORDER — TEMAZEPAM 30 MG PO CAPS: 30.0000 mg | ORAL_CAPSULE | Freq: Every evening | ORAL | Status: DC | PRN

## 2012-12-08 NOTE — Telephone Encounter (Signed)
rx has been printed and signed by SN and faxed to the pharmacy. Nothing further is needed.

## 2012-12-11 ENCOUNTER — Telehealth: Payer: Self-pay | Admitting: Pulmonary Disease

## 2012-12-11 MED ORDER — SILDENAFIL CITRATE 100 MG PO TABS: 100.0000 mg | ORAL_TABLET | Freq: Every day | ORAL | Status: DC | PRN

## 2012-12-11 NOTE — Telephone Encounter (Signed)
Pt returned call

## 2012-12-11 NOTE — Telephone Encounter (Signed)
Called, spoke with pt.  Pt requesting rx for cialis or viagra.  States he has been on both in the past.  Doesn't have a preference on which one -- would like whichever one SN feels is best for him.  Dr. Kriste Basque, pls advise.  Thank you.  [facility] [address]  Last OV with SN Sep 28, 2012; asked to f/u in 3 months Pending OV with SN Feb 02, 2013

## 2012-12-11 NOTE — Telephone Encounter (Signed)
Per SN---ok to send in viagra 100 mg  #10  Use as directed and refill prn.  This has been sent in to the pharmacy for the pt.  i called and spoke with pt and he is aware and nothing further is needed.

## 2012-12-11 NOTE — Telephone Encounter (Signed)
ATC patient, no answer [id number] 

## 2012-12-12 ENCOUNTER — Telehealth: Payer: Self-pay | Admitting: Pulmonary Disease

## 2012-12-12 NOTE — Telephone Encounter (Signed)
This rx was sent in yesterday.  i called and spoke with the pharmacy and they will fill this for the pt today since they did not get the rx on 3/17.  i called and spoke with pt and he is aware. Nothing further is needed.

## 2012-12-13 ENCOUNTER — Ambulatory Visit: Payer: Medicare Other | Admitting: Pulmonary Disease

## 2012-12-22 ENCOUNTER — Ambulatory Visit (INDEPENDENT_AMBULATORY_CARE_PROVIDER_SITE_OTHER): Payer: Medicare Other | Admitting: General Surgery

## 2012-12-22 ENCOUNTER — Encounter (INDEPENDENT_AMBULATORY_CARE_PROVIDER_SITE_OTHER): Payer: Self-pay | Admitting: General Surgery

## 2012-12-22 ENCOUNTER — Telehealth: Payer: Self-pay | Admitting: Pulmonary Disease

## 2012-12-22 VITALS — BP 138/78 | HR 61 | Temp 97.1°F | Resp 18 | Ht 69.0 in | Wt 170.2 lb

## 2012-12-22 DIAGNOSIS — D1739 Benign lipomatous neoplasm of skin and subcutaneous tissue of other sites: Secondary | ICD-10-CM

## 2012-12-22 DIAGNOSIS — D170 Benign lipomatous neoplasm of skin and subcutaneous tissue of head, face and neck: Secondary | ICD-10-CM

## 2012-12-22 NOTE — Telephone Encounter (Signed)
Oxycodone was last filled on 3/3.  i called and spoke with a family member of angela/pt and they are aware that this medication cannot be filled until 4/3.   Nothing further is needed.

## 2012-12-22 NOTE — Patient Instructions (Signed)
Call if area begins to bother you

## 2012-12-22 NOTE — Progress Notes (Signed)
Subjective:     Patient ID: Blake [name], male   DOB: 06/17/1941, 72 y.o.   MRN: [id number]  HPI We're asked to see the patient in consultation by Dr. Kriste Basque to evaluate him for a lipoma on his neck. The patient is a 72 year old white male who has had a small lump on the back of his neck for some time. He denies any pain from it. He has no problems moving his neck. He also notes a small lump in the palm of his pain and. He continues to smoke at least a pack a day of cigarettes and drink about 12-24 beers a day. He denies any fevers or chills. He denies any nausea or vomiting.  Review of Systems  Constitutional: Negative.   HENT: Negative.   Eyes: Negative.   Respiratory: Negative.   Cardiovascular: Negative.   Gastrointestinal: Negative.   Endocrine: Negative.   Genitourinary: Negative.   Musculoskeletal: Negative.   Skin: Negative.   Allergic/Immunologic: Negative.   Neurological: Negative.   Hematological: Negative.   Psychiatric/Behavioral: Negative.        Objective:   Physical Exam  Constitutional: He is oriented to person, place, and time. He appears well-developed and well-nourished.  HENT:  Head: Normocephalic and atraumatic.  Eyes: Conjunctivae and EOM are normal. Pupils are equal, round, and reactive to light.  Neck: Normal range of motion. Neck supple.  There is a fatty mass in the posterior lower neck that is approximately 3 cm in diameter consistent with a lipoma  Cardiovascular: Normal rate, regular rhythm and normal heart sounds.   Pulmonary/Chest: Effort normal and breath sounds normal.  Abdominal: Soft. Bowel sounds are normal.  Musculoskeletal: Normal range of motion.  There is a nodule along a flexor tendon in the palm of the right hand  Lymphadenopathy:    He has no cervical adenopathy.  Neurological: He is alert and oriented to person, place, and time.  Skin: Skin is warm and dry.  Psychiatric: He has a normal mood and affect. His behavior is normal.        Assessment:     The patient has what appears to be a small lipoma on the back of the neck as well as a possible Dupuytren's contracture in the palm of the right hand.     Plan:     Because the lipoma appears to be asymptomatic I do not think anything needs to be done at this point to remove it. If he would like to have it removed I think he should quit drinking alcohol for several weeks prior to surgery to make sure it safe to operate on him. He would like to simply call us if the lipoma bothers him. I also offered to give him the name of a hand surgeon and he has declined at the moment

## 2012-12-28 ENCOUNTER — Telehealth: Payer: Self-pay | Admitting: Pulmonary Disease

## 2012-12-28 MED ORDER — OXYCODONE HCL 5 MG PO TABS: 5.0000 mg | ORAL_TABLET | Freq: Three times a day (TID) | ORAL | Status: DC | PRN

## 2012-12-28 NOTE — Telephone Encounter (Signed)
RX has been printed off for SN to sign. Marliss Czar is aware.

## 2012-12-28 NOTE — Telephone Encounter (Signed)
rx has been placed up front for the pt to pick up. Nothing further is needed.

## 2013-01-26 ENCOUNTER — Telehealth: Payer: Self-pay | Admitting: Pulmonary Disease

## 2013-01-26 DIAGNOSIS — M542 Cervicalgia: Secondary | ICD-10-CM

## 2013-01-26 MED ORDER — OXYCODONE HCL 5 MG PO TABS: 5.0000 mg | ORAL_TABLET | Freq: Three times a day (TID) | ORAL | Status: DC | PRN

## 2013-01-26 NOTE — Telephone Encounter (Signed)
Blake [name] is aware of recs. Referral placed. RX placed upfront for p/u

## 2013-01-26 NOTE — Telephone Encounter (Signed)
Per SN--ok to give the refill of the oxycodone and this rx has been printed and placed on SN cart to be signed.  He has known severe CSpine disease and needs to follow up ASAP with [facility]--Dr. Ranell Patrick for eval of neck pain. SN will not be able to keep filling the oxycodone for him.   thanks

## 2013-01-26 NOTE — Telephone Encounter (Signed)
Oxycodone last refilled 12/28/12 #90 x 0 refills.  Last OV 09/28/12 Pending 02/07/13 Please advise if okay to refill thanks SN

## 2013-02-02 ENCOUNTER — Ambulatory Visit: Payer: Medicare Other | Admitting: Pulmonary Disease

## 2013-02-06 ENCOUNTER — Telehealth: Payer: Self-pay | Admitting: Pulmonary Disease

## 2013-02-06 NOTE — Telephone Encounter (Signed)
Leigh have you seen a form on pt back brace? Please advise thanks

## 2013-02-06 NOTE — Telephone Encounter (Signed)
I spoke with Blake [name] and made her aware. Nothing further was needed

## 2013-02-06 NOTE — Telephone Encounter (Signed)
i have not seen any forms sent over on this pt for a back brace.  They will need to send these forms to his ortho doctor since they have to be measured for these braces.  thanks

## 2013-02-07 ENCOUNTER — Ambulatory Visit: Payer: Medicare Other | Admitting: Pulmonary Disease

## 2013-02-28 ENCOUNTER — Other Ambulatory Visit: Payer: Self-pay | Admitting: Pulmonary Disease

## 2013-03-02 ENCOUNTER — Telehealth: Payer: Self-pay | Admitting: Pulmonary Disease

## 2013-03-02 NOTE — Telephone Encounter (Signed)
Spoke with pt about his Restoril 30 mg  Takes 1 tablet at night.Pt states had no more fill left. Called [facility]pt filled 90 day supply on  12-09-12 and 02-11-13. So pt is taking it 1 at night. Per pt he only wants to make sure he has refil on hold at pharmacy.  No Known Allergies Dr Kriste Basque is this ok to fill  Dr Kriste Basque please advise

## 2013-03-02 NOTE — Telephone Encounter (Signed)
Per SN---This rx will last pt until 8/18.  Called and spoke with pt and he is aware to call our office a couple of wks before he runs out and we will be able to refill this med for him.

## 2013-03-13 ENCOUNTER — Telehealth: Payer: Self-pay | Admitting: Pulmonary Disease

## 2013-03-13 MED ORDER — OXYCODONE HCL 5 MG PO TABS: 5.0000 mg | ORAL_TABLET | Freq: Three times a day (TID) | ORAL | Status: DC | PRN

## 2013-03-13 NOTE — Telephone Encounter (Signed)
t aware and has an appt on 03-21-13. Carron Curie, CMA

## 2013-03-13 NOTE — Telephone Encounter (Signed)
Per SN--  Ok to refill percocet 5 mg  #90   1 po tid prn pain  No refill.  Will need ROV in July.

## 2013-03-13 NOTE — Telephone Encounter (Signed)
Last OV on 09-28-12, pt requesting a refill for oxycodone,  last refill on 01-26-13 for #90. Please advise if ok to refill. Blake [name], CMA NKDA

## 2013-03-18 ENCOUNTER — Other Ambulatory Visit: Payer: Self-pay | Admitting: Pulmonary Disease

## 2013-03-19 ENCOUNTER — Telehealth: Payer: Self-pay | Admitting: Pulmonary Disease

## 2013-03-19 MED ORDER — RAMIPRIL 5 MG PO CAPS: ORAL_CAPSULE | ORAL | Status: DC

## 2013-03-19 MED ORDER — TEMAZEPAM 30 MG PO CAPS: 30.0000 mg | ORAL_CAPSULE | Freq: Every evening | ORAL | Status: DC | PRN

## 2013-03-19 NOTE — Telephone Encounter (Signed)
lmom rx's sent for altace 5mg  qd #90 And called in the restoril 30mg   1 at bed time.  #90 x1 Pt has appt June 25th with Dr Kriste Basque.

## 2013-03-21 ENCOUNTER — Ambulatory Visit (INDEPENDENT_AMBULATORY_CARE_PROVIDER_SITE_OTHER): Payer: Medicare Other | Admitting: Pulmonary Disease

## 2013-03-21 ENCOUNTER — Encounter: Payer: Self-pay | Admitting: Pulmonary Disease

## 2013-03-21 VITALS — BP 120/80 | HR 52 | Temp 98.3°F | Ht 70.5 in | Wt 162.2 lb

## 2013-03-21 DIAGNOSIS — G319 Degenerative disease of nervous system, unspecified: Secondary | ICD-10-CM

## 2013-03-21 DIAGNOSIS — J4489 Other specified chronic obstructive pulmonary disease: Secondary | ICD-10-CM

## 2013-03-21 DIAGNOSIS — D126 Benign neoplasm of colon, unspecified: Secondary | ICD-10-CM

## 2013-03-21 DIAGNOSIS — I10 Essential (primary) hypertension: Secondary | ICD-10-CM

## 2013-03-21 DIAGNOSIS — E785 Hyperlipidemia, unspecified: Secondary | ICD-10-CM

## 2013-03-21 DIAGNOSIS — K219 Gastro-esophageal reflux disease without esophagitis: Secondary | ICD-10-CM

## 2013-03-21 DIAGNOSIS — F17200 Nicotine dependence, unspecified, uncomplicated: Secondary | ICD-10-CM

## 2013-03-21 DIAGNOSIS — F1010 Alcohol abuse, uncomplicated: Secondary | ICD-10-CM

## 2013-03-21 DIAGNOSIS — I679 Cerebrovascular disease, unspecified: Secondary | ICD-10-CM

## 2013-03-21 DIAGNOSIS — J449 Chronic obstructive pulmonary disease, unspecified: Secondary | ICD-10-CM

## 2013-03-21 DIAGNOSIS — K5730 Diverticulosis of large intestine without perforation or abscess without bleeding: Secondary | ICD-10-CM

## 2013-03-21 NOTE — Patient Instructions (Addendum)
Today we updated your med list in our EPIC system...    Continue your current medications the same...  Blake [name], you NEED to quit the smoking & the drinking...  Call for any questions...  Let's plan a follow up visit in 46mo w/ CXR & FASTING blood work at that time.Marland KitchenMarland Kitchen
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
## 2013-03-23 ENCOUNTER — Telehealth: Payer: Self-pay | Admitting: Pulmonary Disease

## 2013-03-23 NOTE — Telephone Encounter (Signed)
Spoke with patient patient requesting refill on temazepam. Informed patient that rx was already [facility] 03/19/13 Patient stated he had not received medication Called and spoke with rep at [facility] patient medication will ship 04/03/13 Spoke with patient informed him of this and nothing further needed at this time

## 2013-03-27 ENCOUNTER — Telehealth: Payer: Self-pay | Admitting: Pulmonary Disease

## 2013-03-27 NOTE — Telephone Encounter (Signed)
Spoke with patient-- Patient states he has taken viagra before been its been a long time Patient would like to know how long does it take for medication to start "acting" And would also like to know how long does it last Advised patient a tablet usually absorbs within 30 min, but would forward msg to Dr. Kriste Basque for further recs Dr. Kriste Basque please advise, thank you!  Last OV: 03/21/13 Next OV: 10/03/13

## 2013-03-27 NOTE — Telephone Encounter (Signed)
Called and spoke with pt and he is aware that per SN---  We cannot tell how this medication will work for him.  Sometimes it does not work for people.  If this is the case then we will need to send him over to urology for further eval.  Pt stated that he does not feel that we need to refer him to urology.  Pt to call back for any other concerns.

## 2013-04-05 ENCOUNTER — Telehealth: Payer: Self-pay | Admitting: Pulmonary Disease

## 2013-04-05 NOTE — Telephone Encounter (Signed)
Spoke to pt. He is aware that this medication was sent to [facility] on 03/19/13 #90 by Melbourne Abts. Pt states that he might have misplaced his bottle, but will look for it. Nothing further was needed at this time.

## 2013-04-05 NOTE — Telephone Encounter (Signed)
Called and spoke with pt and he stated that he cannot find his bottle of his temazepam.  He stated that he is needing a refill of this and he filled this through [employer].  Pt will call [facility] back and let them know that he was not able to find the rx.  i will call [facility] tomorrow to find out when this was last shipped to the pt.  Pt is aware.

## 2013-04-06 NOTE — Telephone Encounter (Signed)
Called and spoke with [facility] and they stated that they do not have an active account for this pt.  i advised them that this is where he has been filling his medications and they have been shipping these medications to the pt.  She advised that she will need an rx # to find the pt in the system.    i called the pt and he will call me back once he gets home to give me the rx #.

## 2013-04-10 NOTE — Telephone Encounter (Signed)
lmtcb x1 for pt. 

## 2013-04-11 ENCOUNTER — Telehealth: Payer: Self-pay | Admitting: Pulmonary Disease

## 2013-04-11 DIAGNOSIS — R233 Spontaneous ecchymoses: Secondary | ICD-10-CM

## 2013-04-11 NOTE — Telephone Encounter (Signed)
Paper was received by [facility] to refill this medication for the pt.  This has been signed by [facility] and faxed back.

## 2013-04-11 NOTE — Telephone Encounter (Signed)
Per SN---  No openings aval with SN this week.  We can set up appt with TP  Or get him in to see dermatology.  thanks

## 2013-04-11 NOTE — Telephone Encounter (Signed)
TP is not here today nor Friday. She is booked up tomorrow.  I spoke with pt and he is fine with seeing dermatology. Referral placed nothing further was needed

## 2013-04-11 NOTE — Telephone Encounter (Signed)
I spoke with pt he c/o spots on R hand, on back, and buttocks. Pt stated she noticed this couple weeks ago. He stated the spots are not raised, no itchy, not painful. It is red around the outside and white-yellow in the middle. He is wanting to be seen today since he has a ride to bring him. Please advise SN thanks Last OV 03/21/13 Pending 10/03/13 No Known Allergies

## 2013-04-16 ENCOUNTER — Telehealth: Payer: Self-pay | Admitting: Pulmonary Disease

## 2013-04-16 ENCOUNTER — Other Ambulatory Visit: Payer: Self-pay | Admitting: Surgery

## 2013-04-16 MED ORDER — OXYCODONE HCL 5 MG PO TABS: 5.0000 mg | ORAL_TABLET | Freq: Three times a day (TID) | ORAL | Status: DC | PRN

## 2013-04-16 NOTE — Telephone Encounter (Signed)
rx has been singed by SN and placed up front.  Called and spoke with angela and she is aware of rx that is up front and ready to be picked up tomorrow.  Nothing further is needed.

## 2013-04-16 NOTE — Telephone Encounter (Signed)
rx has been printed out and placed on SN cart to be signed. 

## 2013-05-07 ENCOUNTER — Emergency Department (HOSPITAL_COMMUNITY)
Admission: EM | Admit: 2013-05-07 | Discharge: 2013-05-08 | Disposition: A | Discharge status: Home / Self Care | Payer: Medicare Other | Attending: Emergency Medicine | Admitting: Emergency Medicine

## 2013-05-07 ENCOUNTER — Encounter (HOSPITAL_COMMUNITY): Payer: Self-pay | Admitting: Emergency Medicine

## 2013-05-07 DIAGNOSIS — E785 Hyperlipidemia, unspecified: Secondary | ICD-10-CM | POA: Insufficient documentation

## 2013-05-07 DIAGNOSIS — F17200 Nicotine dependence, unspecified, uncomplicated: Secondary | ICD-10-CM | POA: Insufficient documentation

## 2013-05-07 DIAGNOSIS — Z860100 Personal history of colon polyps, unspecified: Secondary | ICD-10-CM | POA: Insufficient documentation

## 2013-05-07 DIAGNOSIS — E119 Type 2 diabetes mellitus without complications: Secondary | ICD-10-CM | POA: Insufficient documentation

## 2013-05-07 DIAGNOSIS — Z79899 Other long term (current) drug therapy: Secondary | ICD-10-CM | POA: Insufficient documentation

## 2013-05-07 DIAGNOSIS — I10 Essential (primary) hypertension: Secondary | ICD-10-CM | POA: Insufficient documentation

## 2013-05-07 DIAGNOSIS — W010XXA Fall on same level from slipping, tripping and stumbling without subsequent striking against object, initial encounter: Secondary | ICD-10-CM | POA: Insufficient documentation

## 2013-05-07 DIAGNOSIS — J4489 Other specified chronic obstructive pulmonary disease: Secondary | ICD-10-CM | POA: Insufficient documentation

## 2013-05-07 DIAGNOSIS — G4700 Insomnia, unspecified: Secondary | ICD-10-CM | POA: Insufficient documentation

## 2013-05-07 DIAGNOSIS — Y92009 Unspecified place in unspecified non-institutional (private) residence as the place of occurrence of the external cause: Secondary | ICD-10-CM | POA: Insufficient documentation

## 2013-05-07 DIAGNOSIS — Z8719 Personal history of other diseases of the digestive system: Secondary | ICD-10-CM | POA: Insufficient documentation

## 2013-05-07 DIAGNOSIS — Z23 Encounter for immunization: Secondary | ICD-10-CM | POA: Insufficient documentation

## 2013-05-07 DIAGNOSIS — S41109A Unspecified open wound of unspecified upper arm, initial encounter: Secondary | ICD-10-CM | POA: Insufficient documentation

## 2013-05-07 DIAGNOSIS — Z8659 Personal history of other mental and behavioral disorders: Secondary | ICD-10-CM | POA: Insufficient documentation

## 2013-05-07 DIAGNOSIS — Y9389 Activity, other specified: Secondary | ICD-10-CM | POA: Insufficient documentation

## 2013-05-07 DIAGNOSIS — S01311A Laceration without foreign body of right ear, initial encounter: Secondary | ICD-10-CM

## 2013-05-07 DIAGNOSIS — J449 Chronic obstructive pulmonary disease, unspecified: Secondary | ICD-10-CM | POA: Insufficient documentation

## 2013-05-07 DIAGNOSIS — S01309A Unspecified open wound of unspecified ear, initial encounter: Secondary | ICD-10-CM | POA: Insufficient documentation

## 2013-05-07 DIAGNOSIS — G8929 Other chronic pain: Secondary | ICD-10-CM | POA: Insufficient documentation

## 2013-05-07 NOTE — ED Notes (Signed)
HQI:[id number]<XB> Expected date:<BR> Expected time:<BR> Means of arrival:<BR> Comments:<BR> EMS/71 yo male with slip and fall-laceration to ear/positive ETOH
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
## 2013-05-08 ENCOUNTER — Emergency Department (HOSPITAL_COMMUNITY): Payer: Medicare Other

## 2013-05-08 MED ORDER — HYDROCODONE-ACETAMINOPHEN 5-325 MG PO TABS: 2.0000 | ORAL_TABLET | ORAL | Status: DC | PRN

## 2013-05-08 MED ORDER — TETANUS-DIPHTH-ACELL PERTUSSIS 5-2.5-18.5 LF-MCG/0.5 IM SUSP
0.5000 mL | Freq: Once | INTRAMUSCULAR | Status: AC
  Administered 2013-05-08: 0.5 mL via INTRAMUSCULAR
  Filled 2013-05-08: qty 0.5

## 2013-05-08 NOTE — ED Provider Notes (Signed)
CSN: [id number]     Arrival date & time 05/07/13  2331 History     First MD Initiated Contact with Patient 05/08/13 0015     Chief Complaint  Patient presents with  . Fall  . Head Injury   (Consider location/radiation/quality/duration/timing/severity/associated sxs/prior Treatment) HPI Comments: Patient presents with an ear laceration status post fall. He does have a history of recent EtOH use and states he was walking down a wheelchair ramp at his house and slipped. He states that that ramp was wet and he slipped on a wet patch. He fell onto his right ear sustaining a large laceration to his right ear. He denies any loss of consciousness. He has some constant mild throbbing pain to his ear but denies any other complaints of pain. He denies any neck or back pain. He has a small skin tear to his right arm but denies any other injuries. He's not sure when his last tetanus shot was.  Patient is a 72 y.o. male presenting with fall and head injury.  Fall Pertinent negatives include no chest pain, no abdominal pain, no headaches and no shortness of breath.  Head Injury Associated symptoms: no headaches, no nausea, no numbness and no vomiting     Past Medical History  Diagnosis Date  . Pulmonary nodule   . COPD (chronic obstructive pulmonary disease)   . Cigarette smoker   . Hypertension   . Hyperlipidemia   . GERD (gastroesophageal reflux disease)   . Diverticulosis of colon   . Hx of colonic polyps   . Elevated prostate specific antigen (PSA)   . Chronic insomnia   . Diabetes mellitus     no per pt, no meds taken  . ETOH abuse   . Shortness of breath    Past Surgical History  Procedure Laterality Date  . Vasectomy    . Needle biopsy rul nodule  01/2009    benign  . Tonsillectomy  1947  . Colonoscopy     Family History  Problem Relation Age of Onset  . Lung cancer Brother   . Diabetes Father   . Colon cancer Neg Hx   . Esophageal cancer Neg Hx   . Stomach cancer Neg Hx     History  Substance Use Topics  . Smoking status: Current Every Day Smoker -- 1.00 packs/day for 55 years    Types: Cigarettes  . Smokeless tobacco: Never Used  . Alcohol Use: 14.4 oz/week    24 Cans of beer per week     Comment: 12-24 beer daily    Review of Systems  Constitutional: Negative for fever, chills, diaphoresis and fatigue.  HENT: Positive for ear pain. Negative for congestion, rhinorrhea and sneezing.   Eyes: Negative.   Respiratory: Negative for cough, chest tightness and shortness of breath.   Cardiovascular: Negative for chest pain and leg swelling.  Gastrointestinal: Negative for nausea, vomiting, abdominal pain, diarrhea and blood in stool.  Genitourinary: Negative for frequency, hematuria, flank pain and difficulty urinating.  Musculoskeletal: Negative for back pain and arthralgias.  Skin: Positive for wound. Negative for rash.  Neurological: Negative for dizziness, speech difficulty, weakness, numbness and headaches.    Allergies  Review of patient's allergies indicates no known allergies.  Home Medications   Current Outpatient Rx  Name  Route  Sig  Dispense  Refill  . atenolol (TENORMIN) 50 MG tablet   Oral   Take 50 mg by mouth daily.         Marland Kitchen  folic acid (FOLVITE) 1 MG tablet      TAKE 1 TABLET ONCE DAILY   90 tablet   3   . gemfibrozil (LOPID) 600 MG tablet   Oral   Take 600 mg by mouth 2 (two) times daily.         Marland Kitchen oxyCODONE (OXY IR/ROXICODONE) 5 MG immediate release tablet   Oral   Take 1 tablet (5 mg total) by mouth 3 (three) times daily as needed.   90 tablet   0   . ramipril (ALTACE) 5 MG capsule   Oral   Take 5 mg by mouth daily.         . sildenafil (VIAGRA) 100 MG tablet   Oral   Take 1 tablet (100 mg total) by mouth daily as needed for erectile dysfunction.   10 tablet   5   . temazepam (RESTORIL) 30 MG capsule   Oral   Take 1 capsule (30 mg total) by mouth at bedtime as needed for sleep. For insomnia   90  capsule   1   . HYDROcodone-acetaminophen (NORCO/VICODIN) 5-325 MG per tablet   Oral   Take 2 tablets by mouth every 4 (four) hours as needed for pain.   20 tablet   0    BP 124/69  Pulse 64  Temp(Src) 98.3 F (36.8 C) (Oral)  Resp 18  SpO2 98% Physical Exam  Constitutional: He is oriented to person, place, and time. He appears well-developed and well-nourished.  HENT:  Head: Normocephalic and atraumatic.  Patient has a large avulsion type laceration in his right ear involving a 3 cm avulsed piece of cartilage. There some minor oozing from the wound but no active bleeding. The ear canal is filled with blood and time unable to visualize the TM. There's no ecchymosis behind the ear.  Eyes: Pupils are equal, round, and reactive to light.  He does have a deformed and nonreactive right pupil which she says is chronic from a prior injury to his side. He can only see light and shapes out of that right eye.  Neck: Normal range of motion. Neck supple.  No pain along the cervical thoracic or lumbosacral spine  Cardiovascular: Normal rate, regular rhythm and normal heart sounds.   Pulmonary/Chest: Effort normal and breath sounds normal. No respiratory distress. He has no wheezes. He has no rales. He exhibits no tenderness.  Abdominal: Soft. Bowel sounds are normal. There is no tenderness. There is no rebound and no guarding.  Musculoskeletal: Normal range of motion. He exhibits no edema.  No pain on palpation or range of motion extremities  Lymphadenopathy:    He has no cervical adenopathy.  Neurological: He is alert and oriented to person, place, and time.  Skin: Skin is warm and dry. No rash noted.  There's a small skin tear to his right forearm.  Psychiatric: He has a normal mood and affect.    ED Course   Procedures (including critical care time)  Labs Reviewed - No data to display Ct Head Wo Contrast  05/08/2013   *RADIOLOGY REPORT*  Clinical Data:  History of fall with head  injury.  CT HEAD WITHOUT CONTRAST CT CERVICAL SPINE WITHOUT CONTRAST  Technique:  Multidetector CT imaging of the head and cervical spine was performed following the standard protocol without intravenous contrast.  Multiplanar CT image reconstructions of the cervical spine were also generated.  Comparison:  Head CT and cervical spine CT 07/04/2012.  CT HEAD  Findings: Mild cerebral  and cerebellar atrophy.  Patchy and confluent areas of decreased attenuation throughout the deep and periventricular white matter of the cerebral hemispheres bilaterally, compatible with chronic microvascular ischemic disease. No acute displaced skull fractures are identified.  No acute intracranial abnormality.  Specifically, no evidence of acute post-traumatic intracranial hemorrhage, no definite regions of acute/subacute cerebral ischemia, no focal mass, mass effect, hydrocephalus or abnormal intra or extra-axial fluid collections. The visualized paranasal sinuses and mastoids are well pneumatized. Postoperative changes in the left maxilla and lateral wall of the left orbit.  IMPRESSION: 1.  No acute displaced skull fractures or findings to suggest significant acute traumatic injury to the brain. 2.  Mild cerebral and cerebellar atrophy with chronic microvascular ischemic changes in the cerebral white matter.  CT CERVICAL SPINE  Findings: No acute displaced fractures of the cervical spine. Alignment is anatomic.  Prevertebral soft tissues are normal. Multilevel degenerative disc disease, most severe C3-C4 and C5-C6. Mild facet arthropathy.  Visualized portions of the upper thorax are unremarkable.  IMPRESSION: 1.  No evidence of significant acute traumatic injury to the cervical spine. 2.  Multilevel degenerative disc disease and cervical spondylosis, as above.   Original Report Authenticated By: Trudie Reed, M.D.   Ct Cervical Spine Wo Contrast  05/08/2013   *RADIOLOGY REPORT*  Clinical Data:  History of fall with head injury.   CT HEAD WITHOUT CONTRAST CT CERVICAL SPINE WITHOUT CONTRAST  Technique:  Multidetector CT imaging of the head and cervical spine was performed following the standard protocol without intravenous contrast.  Multiplanar CT image reconstructions of the cervical spine were also generated.  Comparison:  Head CT and cervical spine CT 07/04/2012.  CT HEAD  Findings: Mild cerebral and cerebellar atrophy.  Patchy and confluent areas of decreased attenuation throughout the deep and periventricular white matter of the cerebral hemispheres bilaterally, compatible with chronic microvascular ischemic disease. No acute displaced skull fractures are identified.  No acute intracranial abnormality.  Specifically, no evidence of acute post-traumatic intracranial hemorrhage, no definite regions of acute/subacute cerebral ischemia, no focal mass, mass effect, hydrocephalus or abnormal intra or extra-axial fluid collections. The visualized paranasal sinuses and mastoids are well pneumatized. Postoperative changes in the left maxilla and lateral wall of the left orbit.  IMPRESSION: 1.  No acute displaced skull fractures or findings to suggest significant acute traumatic injury to the brain. 2.  Mild cerebral and cerebellar atrophy with chronic microvascular ischemic changes in the cerebral white matter.  CT CERVICAL SPINE  Findings: No acute displaced fractures of the cervical spine. Alignment is anatomic.  Prevertebral soft tissues are normal. Multilevel degenerative disc disease, most severe C3-C4 and C5-C6. Mild facet arthropathy.  Visualized portions of the upper thorax are unremarkable.  IMPRESSION: 1.  No evidence of significant acute traumatic injury to the cervical spine. 2.  Multilevel degenerative disc disease and cervical spondylosis, as above.   Original Report Authenticated By: Trudie Reed, M.D.   1. Laceration of ear, right, initial encounter     MDM  Patient and a complex ear laceration involving cartilage. I  discussed the patient with Dr. Lazarus Salines who came in to do the repair of the ear. He has no evidence of intracranial hemorrhage. I did do a CT of his neck given his alcohol consumption but there is no evidence of neck injury. He was discharged home in good condition. He was given instructions to followup with Dr. Lazarus Salines  as directed and 8-9 days.  His TDAP was updated.  Rolan Bucco, MD 05/08/13  0311 

## 2013-05-08 NOTE — Consult Note (Signed)
Blake [name], Blake [name] 72 y.o., male [id number]     Chief Complaint: RIGHT ear laceration  HPI: 72 yo wm slipped on wet ramp 2 hrs ago.  Struck RIGHT side of ear .  No LOC.  Takes ASA daily.  Hearing muffled.  PMH: Past Medical History  Diagnosis Date  . Pulmonary nodule   . COPD (chronic obstructive pulmonary disease)   . Cigarette smoker   . Hypertension   . Hyperlipidemia   . GERD (gastroesophageal reflux disease)   . Diverticulosis of colon   . Hx of colonic polyps   . Elevated prostate specific antigen (PSA)   . Chronic insomnia   . Diabetes mellitus     no per pt, no meds taken  . ETOH abuse   . Shortness of breath     Surg Hx: Past Surgical History  Procedure Laterality Date  . Vasectomy    . Needle biopsy rul nodule  01/2009    benign  . Tonsillectomy  1947  . Colonoscopy      FHx:   Family History  Problem Relation Age of Onset  . Lung cancer Brother   . Diabetes Father   . Colon cancer Neg Hx   . Esophageal cancer Neg Hx   . Stomach cancer Neg Hx    SocHx:  reports that he has been smoking Cigarettes.  He has a 55 pack-year smoking history. He has never used smokeless tobacco. He reports that he drinks about 14.4 ounces of alcohol per week. He reports that he does not use illicit drugs.  ALLERGIES: No Known Allergies   (Not in a hospital admission)  No results found for this or any previous visit (from the past 48 hour(s)). Ct Head Wo Contrast  05/08/2013   *RADIOLOGY REPORT*  Clinical Data:  History of fall with head injury.  CT HEAD WITHOUT CONTRAST CT CERVICAL SPINE WITHOUT CONTRAST  Technique:  Multidetector CT imaging of the head and cervical spine was performed following the standard protocol without intravenous contrast.  Multiplanar CT image reconstructions of the cervical spine were also generated.  Comparison:  Head CT and cervical spine CT 07/04/2012.  CT HEAD  Findings: Mild cerebral and cerebellar atrophy.  Patchy and confluent areas of decreased  attenuation throughout the deep and periventricular white matter of the cerebral hemispheres bilaterally, compatible with chronic microvascular ischemic disease. No acute displaced skull fractures are identified.  No acute intracranial abnormality.  Specifically, no evidence of acute post-traumatic intracranial hemorrhage, no definite regions of acute/subacute cerebral ischemia, no focal mass, mass effect, hydrocephalus or abnormal intra or extra-axial fluid collections. The visualized paranasal sinuses and mastoids are well pneumatized. Postoperative changes in the left maxilla and lateral wall of the left orbit.  IMPRESSION: 1.  No acute displaced skull fractures or findings to suggest significant acute traumatic injury to the brain. 2.  Mild cerebral and cerebellar atrophy with chronic microvascular ischemic changes in the cerebral white matter.  CT CERVICAL SPINE  Findings: No acute displaced fractures of the cervical spine. Alignment is anatomic.  Prevertebral soft tissues are normal. Multilevel degenerative disc disease, most severe C3-C4 and C5-C6. Mild facet arthropathy.  Visualized portions of the upper thorax are unremarkable.  IMPRESSION: 1.  No evidence of significant acute traumatic injury to the cervical spine. 2.  Multilevel degenerative disc disease and cervical spondylosis, as above.   Original Report Authenticated By: Trudie Reed, M.D.   Ct Cervical Spine Wo Contrast  05/08/2013   *RADIOLOGY REPORT*  Clinical Data:  History of fall with head injury.  CT HEAD WITHOUT CONTRAST CT CERVICAL SPINE WITHOUT CONTRAST  Technique:  Multidetector CT imaging of the head and cervical spine was performed following the standard protocol without intravenous contrast.  Multiplanar CT image reconstructions of the cervical spine were also generated.  Comparison:  Head CT and cervical spine CT 07/04/2012.  CT HEAD  Findings: Mild cerebral and cerebellar atrophy.  Patchy and confluent areas of decreased  attenuation throughout the deep and periventricular white matter of the cerebral hemispheres bilaterally, compatible with chronic microvascular ischemic disease. No acute displaced skull fractures are identified.  No acute intracranial abnormality.  Specifically, no evidence of acute post-traumatic intracranial hemorrhage, no definite regions of acute/subacute cerebral ischemia, no focal mass, mass effect, hydrocephalus or abnormal intra or extra-axial fluid collections. The visualized paranasal sinuses and mastoids are well pneumatized. Postoperative changes in the left maxilla and lateral wall of the left orbit.  IMPRESSION: 1.  No acute displaced skull fractures or findings to suggest significant acute traumatic injury to the brain. 2.  Mild cerebral and cerebellar atrophy with chronic microvascular ischemic changes in the cerebral white matter.  CT CERVICAL SPINE  Findings: No acute displaced fractures of the cervical spine. Alignment is anatomic.  Prevertebral soft tissues are normal. Multilevel degenerative disc disease, most severe C3-C4 and C5-C6. Mild facet arthropathy.  Visualized portions of the upper thorax are unremarkable.  IMPRESSION: 1.  No evidence of significant acute traumatic injury to the cervical spine. 2.  Multilevel degenerative disc disease and cervical spondylosis, as above.   Original Report Authenticated By: Trudie Reed, M.D.     Blood pressure 147/97, pulse 58, temperature 98.7 F (37.1 C), temperature source Oral, resp. rate 18, SpO2 100.00%.  PHYSICAL EXAM: Overall appearance: trim. Head:  NCAT Ears:  Complex lac RIGHT upper pinna.  Blood/wax in R EAC Nose:not examined Oral Cavity:  Not examined Oral Pharynx/Hypopharynx/Larynx:  Not examined Neuro:  Grossly intact Neck:  No nodes    Assessment/Plan RIGHT pinna laceration with complex cartilage disruption  With informed consent, anesthetized the RIGHT pinna with 1% xylocaine with 1:100,000 epinephrine, 18 cc  total in stages.  "L"  Shaped lac from helix at 9:00 position, into scaphoid fossa.  Skin lacs on anterior and posterior surfaces of pinnal.  Cartilage repositioned and reapproximated with figure of eight 4-0 chromic sutures.    Skin lacs closed with interrupted 5-0 Ethilon.  Good cosmetic reconstitution of pinna accomplished.  Good vascular supply throughout.  Wax/blood suctioned from RIGHT canal.  Bacitracin ointment applied.  Kerlix wrap applied.    Well tol by patient.  Plan:  Wound hygiene.  Sutures out 8-9 days.  Ice x 24 hrs.  Head elevated x 3-4 days.  Wrap off Wednesday AM, then OK to shower.   Flo Shanks 05/08/2013, 2:44 AM

## 2013-05-10 ENCOUNTER — Other Ambulatory Visit: Payer: Self-pay | Admitting: Pulmonary Disease

## 2013-05-29 ENCOUNTER — Telehealth: Payer: Self-pay | Admitting: Pulmonary Disease

## 2013-05-29 MED ORDER — OXYCODONE HCL 5 MG PO TABS: 5.0000 mg | ORAL_TABLET | Freq: Three times a day (TID) | ORAL | Status: DC | PRN

## 2013-05-29 NOTE — Telephone Encounter (Signed)
rx has been printed out and placed up front for the pts friend angela to pick this up.  i called and spoke with the pt and he stated that he was not sure if he needed a refill of the medication.  He stated that he is blind in one eye and angela helps him out a lot.  He stated that he needs her to come by and pick this up since he is not really able to drive.  i advised him that angela called earlier and left this message for the medication to be filled.  i explained to the pt that he will need to call and get this medication refilled from now on, its ok if he has her to come and pick this up, but he will need to call if he needs this refilled.  Pt could not answer my question as to if he needed a stronger dose, he stated that he does ok on this dose.  Nothing further is needed.

## 2013-05-29 NOTE — Telephone Encounter (Signed)
PLEASE CALL PATIENT ON HER CELL [phone]

## 2013-05-29 NOTE — Telephone Encounter (Signed)
Marylene Land (pt's friend) wants [place] to call her when the rx is ready for pick up @ [phone]. Leanora Ivanoff

## 2013-05-29 NOTE — Telephone Encounter (Signed)
Per SN---  5 mg is the max that SN can write for.  If this dose is not working for him then he will have to go to pain clinic---Dr Spivey.   Refill percocet 5 mg  #90  1 po TID prn pain--- NOT TO EXCEED 3 PER DAY. If the pt wants this refilled.

## 2013-05-29 NOTE — Telephone Encounter (Signed)
Called, spoke with pt - He was last seen on 03/20/13 by SN; asked to f/u in 6 months. Pt is requesting rx for oxycodone.  He has been on 5 mg tid prn for pain.  Pt is requesting a stronger dose.  States the oxy 5 mg doesn't work as well as it does when he takes 2 tablets at one time.   Oxycodone 5 mg rx last given on 04/16/13 # 90 x 0. Pt states he is out of medication and requesting to have Derry Skill pick rx up today.   Dr. Kriste Basque, pls advise.  Thank you.

## 2013-05-29 NOTE — Telephone Encounter (Signed)
Pt calling again in ref to previous msg.Blake [name] ° ° °

## 2013-05-29 NOTE — Telephone Encounter (Signed)
I spoke with the pt and he states he will stick with the 5mg  and ask that rx be printed and he will have caregiver pick-up rx. Rx printed and placed on SN cart to sign.

## 2013-06-22 ENCOUNTER — Telehealth: Payer: Self-pay | Admitting: Pulmonary Disease

## 2013-06-22 MED ORDER — ATENOLOL 50 MG PO TABS: 50.0000 mg | ORAL_TABLET | Freq: Every day | ORAL | Status: DC

## 2013-06-22 NOTE — Telephone Encounter (Signed)
Called, spoke with pt.  Verified he would like atenolol 50 mg qd sent to [facility].  Rx has been sent.  Pt aware and voiced no further questions or concerns at this time.

## 2013-06-29 ENCOUNTER — Telehealth: Payer: Self-pay | Admitting: Pulmonary Disease

## 2013-06-29 NOTE — Telephone Encounter (Signed)
Called and spoke with angela and she stated that they needed to know the medications that the pt is taking.  i have given these to her.  She stated that pt had a recent skin cancer removed and they told her to find out all of his medications.  She stated that they say now that the viagra is causing people to have the skin cancer.

## 2013-07-09 ENCOUNTER — Other Ambulatory Visit: Payer: Self-pay | Admitting: Pulmonary Disease

## 2013-07-09 NOTE — Telephone Encounter (Signed)
Please advise if okay to refill so we can print Rx for patient to come by and pick up. Thanks.

## 2013-07-09 NOTE — Telephone Encounter (Signed)
Per SN---  Pt last filled the oxycodone.  Pt cannot have the hydrocodone and the oxycodone.  What is the pt asking for?  thanks

## 2013-07-10 ENCOUNTER — Telehealth: Payer: Self-pay | Admitting: Pulmonary Disease

## 2013-07-10 MED ORDER — OXYCODONE HCL 5 MG PO TABS: 5.0000 mg | ORAL_TABLET | Freq: Three times a day (TID) | ORAL | Status: DC | PRN

## 2013-07-10 NOTE — Telephone Encounter (Signed)
Called and spoke with pt and he stated that he needs the refill of the oxycodone.  Pt is aware that he cannot fill they hydrocodone and the oxycodone.  Pt last filled the oxy on 9/2.  rx has been printed out and placed on SN cart and pt is aware that we will call him once this is ready to be picked up.

## 2013-07-11 NOTE — Telephone Encounter (Signed)
rx has been left up front for the pt to pick up.  Pt is aware.

## 2013-08-06 ENCOUNTER — Telehealth: Payer: Self-pay | Admitting: Pulmonary Disease

## 2013-08-06 NOTE — Telephone Encounter (Signed)
Last OV 03/21/13 Pending OV 10/03/13 Last fill 07/10/13 #90   SN - please advise on refill. Thanks.

## 2013-08-06 NOTE — Telephone Encounter (Signed)
Pt is aware that we can not refill this medication until 08/10/13. Advised him to call one day in advanced for further refills. He agreed and understood.

## 2013-08-06 NOTE — Telephone Encounter (Signed)
Pt calling again in ref to previous msg.Blake [name] ° ° °

## 2013-08-06 NOTE — Telephone Encounter (Signed)
Per SN--  No eligible for refill until 11/14.  thanks

## 2013-08-09 ENCOUNTER — Telehealth: Payer: Self-pay | Admitting: Pulmonary Disease

## 2013-08-09 NOTE — Telephone Encounter (Signed)
I spoke with Blake [name]. I advised her pt is not due for refill until tomorrow. Pt will need to call us if he needs this medication refilled tomorrow. She will tell him. Nothing further needed

## 2013-08-10 ENCOUNTER — Telehealth: Payer: Self-pay | Admitting: Pulmonary Disease

## 2013-08-10 MED ORDER — OXYCODONE HCL 5 MG PO TABS: 5.0000 mg | ORAL_TABLET | Freq: Three times a day (TID) | ORAL | Status: DC | PRN

## 2013-08-10 NOTE — Telephone Encounter (Signed)
Per SN: Okay for this one refill but he will have to be seen in the office for further refills.   I called and made pt aware. He voiced his understanding. RX left upfront for pick up.

## 2013-08-10 NOTE — Telephone Encounter (Signed)
Spoke with pt to see how he is doing. States that he is still having pain in his side and L arm. Requesting refill on Oxycodone.  Last OV 03/21/13 Pending OV 10/03/12 Last fill 07/10/13 #90  SN - please advise on refill.  *Pt states that when the rx is ready, his fiance Kennith Gain will be the one to pick up his rx.*

## 2013-08-13 ENCOUNTER — Telehealth: Payer: Self-pay | Admitting: Pulmonary Disease

## 2013-08-13 MED ORDER — TEMAZEPAM 30 MG PO CAPS: 30.0000 mg | ORAL_CAPSULE | Freq: Every evening | ORAL | Status: DC | PRN

## 2013-08-13 NOTE — Telephone Encounter (Signed)
RX has been printed and will place on SN cart and will fax once signed. Nothing further needed

## 2013-08-31 ENCOUNTER — Telehealth: Payer: Self-pay | Admitting: Pulmonary Disease

## 2013-08-31 NOTE — Telephone Encounter (Signed)
Called and spoke with pt and we are not able to refill the oxycodone at this time.  Last refill was 11/14 for #90.  Advised pt that he can call back on 12/14 for refill.  Pt voiced his understanding.

## 2013-08-31 NOTE — Telephone Encounter (Signed)
I called [facility] and they just needed verification to okay to refill the temazepam. I have done so. Nothing further needed pt aware

## 2013-09-07 ENCOUNTER — Telehealth: Payer: Self-pay | Admitting: Pulmonary Disease

## 2013-09-07 NOTE — Telephone Encounter (Signed)
ATC # and received message it is full. Pt needs to call for refill per SN. [facility]

## 2013-09-07 NOTE — Telephone Encounter (Signed)
Per previous phone note, pt was not to call prior to 12.14.14 for refill on the Oxycodone Per another previous phone note, SN has requested that pt call personally for refills on this medication; will no longer go thru his caregiver  Marylene Land called back; spoke with Marylene Land who stated that she was aware that a refill could not be requested prior to 12.14.14 but she was unsure if today to be okay or if it would have to be Monday 12.15.14.  While speaking with Samul Dada went to ask SN.  While waiting for Mindy to return, advised Marylene Land that Mr Luppino needs to personally call for refills on the oxycodone.  Marylene Land verbalized her understanding.  Also advised Marylene Land that per SN, pt will need to call on 12.15.14 for this refill.  She verbalized her understanding.  Will sign off and wait for pt to call on 12.15.14.

## 2013-09-09 ENCOUNTER — Telehealth: Payer: Self-pay | Admitting: Pulmonary Disease

## 2013-09-09 NOTE — Telephone Encounter (Signed)
Reports bloody ejaculations. Requests antibiotics.  No fever / chills / frequency / urgency.  Able to urinate without difficulty.  Will call office in AM.  May need Urology referral.

## 2013-09-10 ENCOUNTER — Telehealth: Payer: Self-pay | Admitting: Pulmonary Disease

## 2013-09-10 MED ORDER — OXYCODONE HCL 5 MG PO TABS: 5.0000 mg | ORAL_TABLET | Freq: Three times a day (TID) | ORAL | Status: DC | PRN

## 2013-09-10 NOTE — Telephone Encounter (Signed)
Per phone note from 09/07/13 okay for pt to call for refill today. RX printed. Will forward to leigh once sign.

## 2013-09-10 NOTE — Telephone Encounter (Signed)
rx has been signed and placed up front for the pt.  Pt is aware and pt stated that Blake [name] will come by today to pick this up.

## 2013-09-13 ENCOUNTER — Telehealth: Payer: Self-pay | Admitting: Pulmonary Disease

## 2013-09-13 DIAGNOSIS — R361 Hematospermia: Secondary | ICD-10-CM

## 2013-09-13 NOTE — Telephone Encounter (Signed)
Per SN pt needs to be referred to urology. I called and made pt aware. Nothing further needed

## 2013-09-26 ENCOUNTER — Ambulatory Visit: Payer: Medicare Other | Admitting: Pulmonary Disease

## 2013-10-03 ENCOUNTER — Ambulatory Visit: Payer: Medicare Other | Admitting: Pulmonary Disease

## 2013-10-09 ENCOUNTER — Telehealth: Payer: Self-pay | Admitting: Pulmonary Disease

## 2013-10-09 NOTE — Telephone Encounter (Signed)
RX was sent in 08/13/13 #90 x 1 refill sent to [facility]. I called and spoke with pt. He reports he is out of medication and was told he did not have any refills left. I advised will call [facility]. Spoke with [employer] a Electrical engineer. Pt is not due for refill until 10/18/13 per Anderson Malta.  I called and made pt aware. He reports he is out early bc some days he does have to take an extra tablet. He will await his refill. Nothing further needed

## 2013-10-12 ENCOUNTER — Telehealth: Payer: Self-pay | Admitting: Pulmonary Disease

## 2013-10-12 DIAGNOSIS — E119 Type 2 diabetes mellitus without complications: Secondary | ICD-10-CM

## 2013-10-12 DIAGNOSIS — I10 Essential (primary) hypertension: Secondary | ICD-10-CM

## 2013-10-12 MED ORDER — OXYCODONE HCL 5 MG PO TABS: 5.0000 mg | ORAL_TABLET | Freq: Three times a day (TID) | ORAL | Status: DC | PRN

## 2013-10-12 NOTE — Telephone Encounter (Signed)
rx has been printed out and will call pt once this is ready to be picked up.  

## 2013-10-12 NOTE — Telephone Encounter (Signed)
Called and spoke with pt and he  Is aware of Rx that is ready to be picked up.  Pt stated that he would like to stay in the [place] area for a new primary care doctor.  SN please advise of MD that we can refer the pt to?  thanks

## 2013-10-12 NOTE — Telephone Encounter (Signed)
Per SN---  recs to refer to [facility] office Dr. Larose Kells Order has been placed.

## 2013-10-15 ENCOUNTER — Telehealth: Payer: Self-pay | Admitting: Pulmonary Disease

## 2013-10-15 DIAGNOSIS — E785 Hyperlipidemia, unspecified: Secondary | ICD-10-CM

## 2013-10-15 DIAGNOSIS — I10 Essential (primary) hypertension: Secondary | ICD-10-CM

## 2013-10-15 NOTE — Telephone Encounter (Signed)
Called and spoke with pt and he stated that he had called Dr. Ethel Rana office and was advised that they will not be able to accept his as a pt since they are not taking new medicare  pts at this time.  Referral placed for Primary care visit to be set up.

## 2013-11-01 ENCOUNTER — Other Ambulatory Visit: Payer: Self-pay | Admitting: Pulmonary Disease

## 2013-11-27 ENCOUNTER — Ambulatory Visit: Payer: Medicare Other | Admitting: Pulmonary Disease

## 2014-05-09 ENCOUNTER — Ambulatory Visit: Payer: Medicare Other | Admitting: Internal Medicine

## 2014-06-22 ENCOUNTER — Other Ambulatory Visit: Payer: Self-pay | Admitting: Pulmonary Disease

## 2014-08-09 ENCOUNTER — Ambulatory Visit: Payer: Medicare Other | Admitting: Internal Medicine

## 2014-08-14 ENCOUNTER — Other Ambulatory Visit: Payer: Self-pay | Admitting: Pulmonary Disease

## 2014-09-27 ENCOUNTER — Other Ambulatory Visit: Payer: Self-pay | Admitting: Pulmonary Disease

## 2014-10-14 ENCOUNTER — Emergency Department (HOSPITAL_COMMUNITY): Payer: Medicare Other

## 2014-10-14 ENCOUNTER — Inpatient Hospital Stay (HOSPITAL_COMMUNITY): Payer: Medicare Other

## 2014-10-14 ENCOUNTER — Encounter (HOSPITAL_COMMUNITY): Payer: Self-pay

## 2014-10-14 ENCOUNTER — Inpatient Hospital Stay (HOSPITAL_COMMUNITY)
Admission: EM | Admit: 2014-10-14 | Discharge: 2014-10-17 | DRG: 070 | Disposition: A | Discharge status: Home Health | Payer: Medicare Other | Attending: Internal Medicine | Admitting: Internal Medicine

## 2014-10-14 DIAGNOSIS — J449 Chronic obstructive pulmonary disease, unspecified: Secondary | ICD-10-CM | POA: Diagnosis present

## 2014-10-14 DIAGNOSIS — G9340 Encephalopathy, unspecified: Secondary | ICD-10-CM | POA: Diagnosis present

## 2014-10-14 DIAGNOSIS — E119 Type 2 diabetes mellitus without complications: Secondary | ICD-10-CM

## 2014-10-14 DIAGNOSIS — Z79891 Long term (current) use of opiate analgesic: Secondary | ICD-10-CM | POA: Diagnosis not present

## 2014-10-14 DIAGNOSIS — I679 Cerebrovascular disease, unspecified: Secondary | ICD-10-CM | POA: Diagnosis present

## 2014-10-14 DIAGNOSIS — F1010 Alcohol abuse, uncomplicated: Secondary | ICD-10-CM | POA: Diagnosis present

## 2014-10-14 DIAGNOSIS — Z720 Tobacco use: Secondary | ICD-10-CM | POA: Diagnosis present

## 2014-10-14 DIAGNOSIS — K219 Gastro-esophageal reflux disease without esophagitis: Secondary | ICD-10-CM | POA: Diagnosis present

## 2014-10-14 DIAGNOSIS — I739 Peripheral vascular disease, unspecified: Secondary | ICD-10-CM | POA: Diagnosis present

## 2014-10-14 DIAGNOSIS — E785 Hyperlipidemia, unspecified: Secondary | ICD-10-CM | POA: Diagnosis present

## 2014-10-14 DIAGNOSIS — E860 Dehydration: Secondary | ICD-10-CM | POA: Diagnosis present

## 2014-10-14 DIAGNOSIS — F10231 Alcohol dependence with withdrawal delirium: Secondary | ICD-10-CM | POA: Diagnosis present

## 2014-10-14 DIAGNOSIS — F5104 Psychophysiologic insomnia: Secondary | ICD-10-CM | POA: Diagnosis present

## 2014-10-14 DIAGNOSIS — F10931 Alcohol use, unspecified with withdrawal delirium: Secondary | ICD-10-CM | POA: Diagnosis present

## 2014-10-14 DIAGNOSIS — E871 Hypo-osmolality and hyponatremia: Secondary | ICD-10-CM | POA: Diagnosis present

## 2014-10-14 DIAGNOSIS — R2681 Unsteadiness on feet: Secondary | ICD-10-CM | POA: Diagnosis present

## 2014-10-14 DIAGNOSIS — I639 Cerebral infarction, unspecified: Secondary | ICD-10-CM | POA: Diagnosis present

## 2014-10-14 DIAGNOSIS — I10 Essential (primary) hypertension: Secondary | ICD-10-CM | POA: Diagnosis present

## 2014-10-14 DIAGNOSIS — F0390 Unspecified dementia without behavioral disturbance: Secondary | ICD-10-CM | POA: Diagnosis present

## 2014-10-14 DIAGNOSIS — R410 Disorientation, unspecified: Secondary | ICD-10-CM

## 2014-10-14 DIAGNOSIS — E872 Acidosis: Secondary | ICD-10-CM | POA: Diagnosis present

## 2014-10-14 DIAGNOSIS — D6959 Other secondary thrombocytopenia: Secondary | ICD-10-CM | POA: Diagnosis present

## 2014-10-14 DIAGNOSIS — R270 Ataxia, unspecified: Secondary | ICD-10-CM

## 2014-10-14 DIAGNOSIS — R4182 Altered mental status, unspecified: Secondary | ICD-10-CM

## 2014-10-14 DIAGNOSIS — F17210 Nicotine dependence, cigarettes, uncomplicated: Secondary | ICD-10-CM | POA: Diagnosis present

## 2014-10-14 DIAGNOSIS — F17200 Nicotine dependence, unspecified, uncomplicated: Secondary | ICD-10-CM | POA: Diagnosis present

## 2014-10-14 DIAGNOSIS — Z79899 Other long term (current) drug therapy: Secondary | ICD-10-CM | POA: Diagnosis not present

## 2014-10-14 LAB — BASIC METABOLIC PANEL
Anion gap: 7 (ref 5–15)
BUN: 9 mg/dL (ref 6–23)
CO2: 27 mmol/L (ref 19–32)
Calcium: 8.9 mg/dL (ref 8.4–10.5)
Chloride: 97 mEq/L (ref 96–112)
Creatinine, Ser: 1.11 mg/dL (ref 0.50–1.35)
GFR, EST AFRICAN AMERICAN: 74 mL/min — AB (ref >90)
GFR, EST NON AFRICAN AMERICAN: 64 mL/min — AB (ref >90)
GLUCOSE: 96 mg/dL (ref 70–99)
Potassium: 4.3 mmol/L (ref 3.5–5.1)
SODIUM: 131 mmol/L — AB (ref 135–145)

## 2014-10-14 LAB — CBC WITH DIFFERENTIAL/PLATELET
Basophils Absolute: 0 10*3/uL (ref 0.0–0.1)
Basophils Relative: 0 % (ref 0–1)
Eosinophils Absolute: 0 10*3/uL (ref 0.0–0.7)
Eosinophils Relative: 0 % (ref 0–5)
HEMATOCRIT: 51.2 % (ref 39.0–52.0)
Hemoglobin: 18 g/dL — ABNORMAL HIGH (ref 13.0–17.0)
LYMPHS ABS: 0.4 10*3/uL — AB (ref 0.7–4.0)
Lymphocytes Relative: 2 % — ABNORMAL LOW (ref 12–46)
MCH: 36.4 pg — AB (ref 26.0–34.0)
MCHC: 35.2 g/dL (ref 30.0–36.0)
MCV: 103.6 fL — ABNORMAL HIGH (ref 78.0–100.0)
Monocytes Absolute: 1.1 10*3/uL — ABNORMAL HIGH (ref 0.1–1.0)
Monocytes Relative: 6 % (ref 3–12)
Neutro Abs: 17.3 10*3/uL — ABNORMAL HIGH (ref 1.7–7.7)
Neutrophils Relative %: 92 % — ABNORMAL HIGH (ref 43–77)
PLATELETS: 144 10*3/uL — AB (ref 150–400)
RBC: 4.94 MIL/uL (ref 4.22–5.81)
RDW: 13.9 % (ref 11.5–15.5)
WBC: 18.8 10*3/uL — AB (ref 4.0–10.5)

## 2014-10-14 LAB — COMPREHENSIVE METABOLIC PANEL
ALBUMIN: 4.1 g/dL (ref 3.5–5.2)
ALK PHOS: 71 U/L (ref 39–117)
ALT: 20 U/L (ref 0–53)
AST: 45 U/L — ABNORMAL HIGH (ref 0–37)
Anion gap: 16 — ABNORMAL HIGH (ref 5–15)
BUN: 11 mg/dL (ref 6–23)
CO2: 19 mmol/L (ref 19–32)
Calcium: 9.6 mg/dL (ref 8.4–10.5)
Chloride: 102 mEq/L (ref 96–112)
Creatinine, Ser: 1.1 mg/dL (ref 0.50–1.35)
GFR calc Af Amer: 75 mL/min — ABNORMAL LOW (ref >90)
GFR calc non Af Amer: 65 mL/min — ABNORMAL LOW (ref >90)
GLUCOSE: 185 mg/dL — AB (ref 70–99)
Potassium: 4.8 mmol/L (ref 3.5–5.1)
Sodium: 137 mmol/L (ref 135–145)
TOTAL PROTEIN: 6.8 g/dL (ref 6.0–8.3)
Total Bilirubin: 1.2 mg/dL (ref 0.3–1.2)

## 2014-10-14 LAB — URINALYSIS, ROUTINE W REFLEX MICROSCOPIC
Bilirubin Urine: NEGATIVE
Glucose, UA: 100 mg/dL — AB
KETONES UR: 15 mg/dL — AB
Leukocytes, UA: NEGATIVE
NITRITE: NEGATIVE
Protein, ur: 100 mg/dL — AB
Specific Gravity, Urine: 1.02 (ref 1.005–1.030)
UROBILINOGEN UA: 1 mg/dL (ref 0.0–1.0)
pH: 6 (ref 5.0–8.0)

## 2014-10-14 LAB — MAGNESIUM: Magnesium: 2.2 mg/dL (ref 1.5–2.5)

## 2014-10-14 LAB — LIPASE, BLOOD: LIPASE: 30 U/L (ref 11–59)

## 2014-10-14 LAB — PROTIME-INR
INR: 1.04 (ref 0.00–1.49)
PROTHROMBIN TIME: 13.7 s (ref 11.6–15.2)

## 2014-10-14 LAB — URINE MICROSCOPIC-ADD ON

## 2014-10-14 LAB — GLUCOSE, CAPILLARY: Glucose-Capillary: 97 mg/dL (ref 70–99)

## 2014-10-14 LAB — LACTIC ACID, PLASMA
LACTIC ACID, VENOUS: 4.2 mmol/L — AB (ref 0.5–2.2)
Lactic Acid, Venous: 2.4 mmol/L — ABNORMAL HIGH (ref 0.5–2.2)
Lactic Acid, Venous: 2.1 mmol/L (ref 0.5–2.2)

## 2014-10-14 MED ORDER — GEMFIBROZIL 600 MG PO TABS
600.0000 mg | ORAL_TABLET | Freq: Two times a day (BID) | ORAL | Status: DC
  Administered 2014-10-14 – 2014-10-17 (×5): 600 mg via ORAL
  Filled 2014-10-14 (×7): qty 1

## 2014-10-14 MED ORDER — LORAZEPAM 2 MG/ML IJ SOLN
1.0000 mg | Freq: Four times a day (QID) | INTRAMUSCULAR | Status: DC | PRN
  Administered 2014-10-14: 1 mg via INTRAVENOUS
  Filled 2014-10-14 (×2): qty 1

## 2014-10-14 MED ORDER — VITAMIN B-1 100 MG PO TABS
100.0000 mg | ORAL_TABLET | Freq: Every day | ORAL | Status: DC
  Administered 2014-10-15 – 2014-10-17 (×3): 100 mg via ORAL
  Filled 2014-10-14 (×3): qty 1

## 2014-10-14 MED ORDER — SODIUM CHLORIDE 0.9 % IV BOLUS (SEPSIS)
1000.0000 mL | Freq: Once | INTRAVENOUS | Status: AC
  Administered 2014-10-14: 1000 mL via INTRAVENOUS

## 2014-10-14 MED ORDER — VITAMIN B-1 100 MG PO TABS
100.0000 mg | ORAL_TABLET | Freq: Every day | ORAL | Status: DC
  Administered 2014-10-14: 100 mg via ORAL
  Filled 2014-10-14: qty 1

## 2014-10-14 MED ORDER — LORAZEPAM 0.5 MG PO TABS
0.0000 mg | ORAL_TABLET | Freq: Four times a day (QID) | ORAL | Status: AC
  Administered 2014-10-15 (×2): 2 mg via ORAL
  Administered 2014-10-15: 1 mg via ORAL
  Filled 2014-10-14: qty 4
  Filled 2014-10-14: qty 2

## 2014-10-14 MED ORDER — PANTOPRAZOLE SODIUM 40 MG IV SOLR
40.0000 mg | INTRAVENOUS | Status: DC
Start: 2014-10-14 — End: 2014-10-15
  Administered 2014-10-14: 40 mg via INTRAVENOUS
  Filled 2014-10-14 (×2): qty 40

## 2014-10-14 MED ORDER — LORAZEPAM 2 MG/ML IJ SOLN: 2.0000 mg | Freq: Once | INTRAMUSCULAR | Status: DC

## 2014-10-14 MED ORDER — LORAZEPAM 1 MG PO TABS
1.0000 mg | ORAL_TABLET | Freq: Four times a day (QID) | ORAL | Status: DC | PRN
  Administered 2014-10-16: 1 mg via ORAL
  Filled 2014-10-14: qty 1

## 2014-10-14 MED ORDER — ATENOLOL 50 MG PO TABS
50.0000 mg | ORAL_TABLET | Freq: Every day | ORAL | Status: DC
  Filled 2014-10-14: qty 1

## 2014-10-14 MED ORDER — ENOXAPARIN SODIUM 40 MG/0.4ML ~~LOC~~ SOLN
40.0000 mg | SUBCUTANEOUS | Status: DC
  Administered 2014-10-14 – 2014-10-16 (×3): 40 mg via SUBCUTANEOUS
  Filled 2014-10-14 (×4): qty 0.4

## 2014-10-14 MED ORDER — LORAZEPAM 2 MG/ML IJ SOLN
2.0000 mg | Freq: Once | INTRAMUSCULAR | Status: AC
  Administered 2014-10-14: 2 mg via INTRAVENOUS

## 2014-10-14 MED ORDER — ADULT MULTIVITAMIN W/MINERALS CH
1.0000 | ORAL_TABLET | Freq: Every day | ORAL | Status: DC
  Administered 2014-10-14 – 2014-10-17 (×4): 1 via ORAL
  Filled 2014-10-14 (×4): qty 1

## 2014-10-14 MED ORDER — ACETAMINOPHEN 650 MG RE SUPP: 650.0000 mg | Freq: Four times a day (QID) | RECTAL | Status: DC | PRN

## 2014-10-14 MED ORDER — THIAMINE HCL 100 MG/ML IJ SOLN: 100.0000 mg | Freq: Every day | INTRAMUSCULAR | Status: DC

## 2014-10-14 MED ORDER — LORAZEPAM 1 MG PO TABS
0.0000 mg | ORAL_TABLET | Freq: Four times a day (QID) | ORAL | Status: DC
  Administered 2014-10-14: 1 mg via ORAL

## 2014-10-14 MED ORDER — LORAZEPAM 2 MG/ML IJ SOLN
0.0000 mg | Freq: Two times a day (BID) | INTRAMUSCULAR | Status: DC
Start: 2014-10-14 — End: 2014-10-14

## 2014-10-14 MED ORDER — MORPHINE SULFATE 2 MG/ML IJ SOLN
1.0000 mg | INTRAMUSCULAR | Status: DC | PRN
  Administered 2014-10-14: 1 mg via INTRAVENOUS
  Filled 2014-10-14: qty 1

## 2014-10-14 MED ORDER — SODIUM CHLORIDE 0.9 % IJ SOLN
3.0000 mL | Freq: Two times a day (BID) | INTRAMUSCULAR | Status: DC
  Administered 2014-10-14: 3 mL via INTRAVENOUS
  Administered 2014-10-16: 10 mL via INTRAVENOUS
  Administered 2014-10-16 – 2014-10-17 (×2): 3 mL via INTRAVENOUS

## 2014-10-14 MED ORDER — LORAZEPAM 0.5 MG PO TABS
0.0000 mg | ORAL_TABLET | Freq: Two times a day (BID) | ORAL | Status: DC
Start: 2014-10-16 — End: 2014-10-17
  Administered 2014-10-17: 0.5 mg via ORAL
  Filled 2014-10-14: qty 4
  Filled 2014-10-14: qty 2

## 2014-10-14 MED ORDER — ONDANSETRON HCL 4 MG/2ML IJ SOLN
4.0000 mg | Freq: Once | INTRAMUSCULAR | Status: AC
  Administered 2014-10-14: 4 mg via INTRAVENOUS
  Filled 2014-10-14: qty 2

## 2014-10-14 MED ORDER — LORAZEPAM 2 MG/ML IJ SOLN
0.0000 mg | Freq: Four times a day (QID) | INTRAMUSCULAR | Status: DC
  Administered 2014-10-14: 2 mg via INTRAVENOUS
  Filled 2014-10-14: qty 1

## 2014-10-14 MED ORDER — LOSARTAN POTASSIUM 50 MG PO TABS
50.0000 mg | ORAL_TABLET | Freq: Every day | ORAL | Status: DC
  Administered 2014-10-14 – 2014-10-17 (×3): 50 mg via ORAL
  Filled 2014-10-14 (×4): qty 1

## 2014-10-14 MED ORDER — LORAZEPAM 1 MG PO TABS
0.0000 mg | ORAL_TABLET | Freq: Two times a day (BID) | ORAL | Status: DC
  Filled 2014-10-14: qty 1

## 2014-10-14 MED ORDER — ALBUTEROL SULFATE (2.5 MG/3ML) 0.083% IN NEBU: 2.5000 mg | INHALATION_SOLUTION | RESPIRATORY_TRACT | Status: DC | PRN

## 2014-10-14 MED ORDER — THIAMINE HCL 100 MG/ML IJ SOLN
100.0000 mg | Freq: Every day | INTRAMUSCULAR | Status: DC
  Filled 2014-10-14 (×2): qty 1

## 2014-10-14 MED ORDER — OXYCODONE HCL 5 MG PO TABS: 5.0000 mg | ORAL_TABLET | Freq: Four times a day (QID) | ORAL | Status: DC | PRN

## 2014-10-14 MED ORDER — INSULIN ASPART 100 UNIT/ML ~~LOC~~ SOLN: 0.0000 [IU] | Freq: Three times a day (TID) | SUBCUTANEOUS | Status: DC

## 2014-10-14 MED ORDER — ASPIRIN EC 325 MG PO TBEC
325.0000 mg | DELAYED_RELEASE_TABLET | Freq: Every day | ORAL | Status: DC
  Administered 2014-10-14 – 2014-10-17 (×4): 325 mg via ORAL
  Filled 2014-10-14 (×4): qty 1

## 2014-10-14 MED ORDER — TETANUS-DIPHTH-ACELL PERTUSSIS 5-2.5-18.5 LF-MCG/0.5 IM SUSP
0.5000 mL | Freq: Once | INTRAMUSCULAR | Status: AC
  Administered 2014-10-14: 0.5 mL via INTRAMUSCULAR
  Filled 2014-10-14: qty 0.5

## 2014-10-14 MED ORDER — SODIUM CHLORIDE 0.9 % IV SOLN
INTRAVENOUS | Status: DC
  Administered 2014-10-14: 125 mL/h via INTRAVENOUS

## 2014-10-14 MED ORDER — ACETAMINOPHEN 325 MG PO TABS: 650.0000 mg | ORAL_TABLET | Freq: Four times a day (QID) | ORAL | Status: DC | PRN

## 2014-10-14 MED ORDER — FOLIC ACID 1 MG PO TABS
1.0000 mg | ORAL_TABLET | Freq: Every day | ORAL | Status: DC
  Administered 2014-10-14 – 2014-10-17 (×4): 1 mg via ORAL
  Filled 2014-10-14 (×4): qty 1

## 2014-10-14 NOTE — ED Notes (Signed)
PHlebotomy at the bedside.

## 2014-10-14 NOTE — Progress Notes (Addendum)
Pt received from ED per stretcher oriented room , placed on heart  monitor after CHG bath done . Oriented to room  Alert/oriented to person only &  impulsive Bed alarm on. MRSA swab done

## 2014-10-14 NOTE — ED Provider Notes (Signed)
CSN: [id number]     Arrival date & time 10/14/14  1106 History   First MD Initiated Contact with Patient 10/14/14 1115     Chief Complaint  Patient presents with  . Alcohol Intoxication     (Consider location/radiation/quality/duration/timing/severity/associated sxs/prior Treatment) Patient is a 74 y.o. male presenting with altered mental status. The history is provided by the patient and a friend. No language interpreter was used.  Altered Mental Status Presenting symptoms: confusion   Presenting symptoms: no partial responsiveness and no unresponsiveness   Severity:  Moderate Episode history:  Single Timing:  Intermittent Progression:  Waxing and waning Chronicity:  New Context: alcohol use   Context: not head injury, taking medications as prescribed and not a nursing home resident   Associated symptoms: no abdominal pain, normal movement, no depression, no fever, no hallucinations, no nausea, no slurred speech and no vomiting     Past Medical History  Diagnosis Date  . Pulmonary nodule   . COPD (chronic obstructive pulmonary disease)   . Cigarette smoker   . Hypertension   . Hyperlipidemia   . GERD (gastroesophageal reflux disease)   . Diverticulosis of colon   . Hx of colonic polyps   . Elevated prostate specific antigen (PSA)   . Chronic insomnia   . Diabetes mellitus     no per pt, no meds taken  . ETOH abuse   . Shortness of breath    Past Surgical History  Procedure Laterality Date  . Vasectomy    . Needle biopsy rul nodule  01/2009    benign  . Tonsillectomy  1947  . Colonoscopy     Family History  Problem Relation Age of Onset  . Lung cancer Brother   . Diabetes Father   . Colon cancer Neg Hx   . Esophageal cancer Neg Hx   . Stomach cancer Neg Hx    History  Substance Use Topics  . Smoking status: Current Every Day Smoker -- 1.00 packs/day for 55 years    Types: Cigarettes  . Smokeless tobacco: Never Used  . Alcohol Use: 14.4 oz/week    24 Cans  of beer per week     Comment: 12-24 beer daily    Review of Systems  Constitutional: Negative for fever.  Respiratory: Negative for cough and shortness of breath.   Gastrointestinal: Negative for nausea, vomiting and abdominal pain.  Genitourinary: Negative for dysuria, urgency and frequency.  Neurological:       Difficulty walking  Psychiatric/Behavioral: Positive for confusion. Negative for hallucinations.  All other systems reviewed and are negative.     Allergies  Review of patient's allergies indicates no known allergies.  Home Medications   Prior to Admission medications   Medication Sig Start Date End Date Taking? Authorizing Provider  atenolol (TENORMIN) 50 MG tablet Take 1 tablet (50 mg total) by mouth daily. 06/22/13  Yes Noralee Space, MD  diclofenac (VOLTAREN) 75 MG EC tablet Take 75 mg by mouth daily.   Yes Historical Provider, MD  folic acid (FOLVITE) 1 MG tablet TAKE 1 TABLET DAILY 11/01/13  Yes Noralee Space, MD  gemfibrozil (LOPID) 600 MG tablet Take 600 mg by mouth 2 (two) times daily.   Yes Historical Provider, MD  losartan (COZAAR) 50 MG tablet Take 50 mg by mouth daily. 10/02/14  Yes Historical Provider, MD  oxyCODONE (OXY IR/ROXICODONE) 5 MG immediate release tablet Take 1 tablet (5 mg total) by mouth 3 (three) times daily as needed. NOT TO  EXCEED 3 PER DAY Patient taking differently: Take 5 mg by mouth 3 (three) times daily as needed for moderate pain. NOT TO EXCEED 3 PER DAY 10/12/13  Yes Noralee Space, MD  ramipril (ALTACE) 5 MG capsule Take 5 mg by mouth daily.   Yes Historical Provider, MD  temazepam (RESTORIL) 30 MG capsule Take 30 mg by mouth at bedtime as needed for sleep.  09/16/14  Yes Historical Provider, MD  VIAGRA 100 MG tablet TAKE 1 TABLET AS NEEDED FORERECTILE DYSFUNCTION 06/24/14  Yes Noralee Space, MD  temazepam (RESTORIL) 30 MG capsule Take 1 capsule (30 mg total) by mouth at bedtime as needed for sleep. For insomnia Patient not taking: Reported on  10/14/2014 08/13/13 08/13/14  Noralee Space, MD   BP 111/70 mmHg  Pulse 84  Temp(Src) 98.2 F (36.8 C) (Oral)  Resp 17  Ht 5' 10.5" (1.791 m)  Wt 150 lb 5.7 oz (68.2 kg)  BMI 21.26 kg/m2  SpO2 96% Physical Exam  Constitutional: He appears well-developed and well-nourished. No distress.  HENT:  Head: Normocephalic and atraumatic.  Eyes: Pupils are equal, round, and reactive to light.  Neck: Normal range of motion.  Cardiovascular: Normal rate, regular rhythm and normal heart sounds.   Pulmonary/Chest: Effort normal and breath sounds normal. No respiratory distress. He has no decreased breath sounds. He has no wheezes. He has no rhonchi. He has no rales.  Abdominal: Soft. He exhibits no distension. There is no tenderness. There is no rebound and no guarding.  Musculoskeletal: He exhibits no edema or tenderness.  Neurological: He is alert. He is disoriented. He exhibits normal muscle tone. GCS eye subscore is 4. GCS verbal subscore is 5. GCS motor subscore is 6.  Waxing and waning mental status.  Strength 5/5 bilateral upper and lower extremities.  Sensation intact x4 extremities.  CN II-XII intact.  Ataxic gait with ambulation.  Fine tremor to bilateral upper extremities.    Skin: Skin is warm and dry.  Nursing note and vitals reviewed.   ED Course  Procedures (including critical care time) Labs Review Labs Reviewed  CBC WITH DIFFERENTIAL - Abnormal; Notable for the following:    WBC 18.8 (*)    Hemoglobin 18.0 (*)    MCV 103.6 (*)    MCH 36.4 (*)    Platelets 144 (*)    Neutrophils Relative % 92 (*)    Neutro Abs 17.3 (*)    Lymphocytes Relative 2 (*)    Lymphs Abs 0.4 (*)    Monocytes Absolute 1.1 (*)    All other components within normal limits  COMPREHENSIVE METABOLIC PANEL - Abnormal; Notable for the following:    Glucose, Bld 185 (*)    AST 45 (*)    GFR calc non Af Amer 65 (*)    GFR calc Af Amer 75 (*)    Anion gap 16 (*)    All other components within normal  limits  LACTIC ACID, PLASMA - Abnormal; Notable for the following:    Lactic Acid, Venous 4.2 (*)    All other components within normal limits  URINALYSIS, ROUTINE W REFLEX MICROSCOPIC - Abnormal; Notable for the following:    Glucose, UA 100 (*)    Hgb urine dipstick MODERATE (*)    Ketones, ur 15 (*)    Protein, ur 100 (*)    All other components within normal limits  URINE MICROSCOPIC-ADD ON - Abnormal; Notable for the following:    Casts HYALINE CASTS (*)  All other components within normal limits  LACTIC ACID, PLASMA - Abnormal; Notable for the following:    Lactic Acid, Venous 2.4 (*)    All other components within normal limits  BASIC METABOLIC PANEL - Abnormal; Notable for the following:    Sodium 131 (*)    GFR calc non Af Amer 64 (*)    GFR calc Af Amer 74 (*)    All other components within normal limits  MRSA PCR SCREENING  LIPASE, BLOOD  MAGNESIUM  PROTIME-INR  LACTIC ACID, PLASMA  GLUCOSE, CAPILLARY  COMPREHENSIVE METABOLIC PANEL  CBC    Imaging Review Ct Head Wo Contrast  10/14/2014   CLINICAL DATA:  Altered mental status changes starting today.  EXAM: CT HEAD WITHOUT CONTRAST  TECHNIQUE: Contiguous axial images were obtained from the base of the skull through the vertex without intravenous contrast.  COMPARISON:  05/08/2013  FINDINGS: There is no evidence for acute hemorrhage, hydrocephalus, mass lesion, or abnormal extra-axial fluid collection. No definite CT evidence for acute infarction. Diffuse loss of parenchymal volume is consistent with atrophy. Patchy low attenuation in the deep hemispheric and periventricular white matter is nonspecific, but likely reflects chronic microvascular ischemic demyelination. The visualized paranasal sinuses and mastoid air cells are clear.  IMPRESSION: Stable exam. Atrophy with chronic small vessel white matter ischemic demyelination. No acute findings.   Electronically Signed   By: Misty Stanley M.D.   On: 10/14/2014 12:55   Dg  Chest Portable 1 View  10/14/2014   CLINICAL DATA:  74 year old hypertensive diabetic alcohol male with COPD and history of smoking. Vomiting. Initial encounter.  EXAM: PORTABLE CHEST - 1 VIEW  COMPARISON:  07/04/2012 chest x-ray.  02/04/2009 PET-CT.  FINDINGS: Multiple remote bilateral rib fractures.  Skin fold right lung apex without evidence gross pneumothorax.  No evidence of pulmonary edema or segmental consolidation.  PET CT detected parenchymal changes not appreciated on current exam. This can be assessed on followup two view exam with cardiac leads removed when patient is able.  Calcified aorta.  Heart size within normal limits.  : No acute abnormality.  Please see above discussion.   Electronically Signed   By: Chauncey Cruel M.D.   On: 10/14/2014 16:20     EKG Interpretation None      MDM   Final diagnoses:  Altered mental status  Dehydration  Alcohol withdrawal delirium  Confusion    Patient is a 74 year old Caucasian male with pertinent past medical history of alcohol abuse and diabetes who comes to the emergency department today with difficulty walking and intermittent confusion for the past day. Physical exam as above. Patient does have multiple bruises concerning for fall versus a seizure. He did not have a witnessed fall or seizure and is unable to provide his history, however he had an episode of incontinence and bit his tongue.  As a result of his altered mental status a CT of his head was obtained to evaluate for intracranial injury. This demonstrated no injury and no hemorrhage. Patient is afebrile as result I doubt a septic source. The remainder of the workup included a CBC, CMP, lipase, lactic acid, magnesium, UA, EKG, and a chest x-ray. UA was unremarkable as result doubt UTI. Magnesium was 2.2.  Lactic acid was slightly elevated at 4.2 which is consistent with a possible seizure.  He was treated with a liter of fluid and will obtain a repeat lactic acid. Patient's initial EKG  demonstrated significant baseline wander.  A repeat EKG was obtained.  These are detailed above.  CMP was unremarkable. CBC with WBC of 18.8 and hemoglobin of 18. Patient was evaluated multiple times in the emergency department with waxing and waning mental status.  A CIWA was obtained and was elevated at 16.  Since it has been approximately 2 days since the patient's last drink. I feel this is concerning for delirium secondary to alcohol withdrawal. Patient was felt to require admission to the hospital for further observation. Patient was admitted to the hospitalist service in good condition labs and imaging review by myself and considered in medical decision making. Imaging was interpreted radiology. Care was discussed by attending Dr. Reather Converse.      Katheren Shams, MD 10/14/14 2346  Mariea Clonts, MD 10/15/14 [phone]
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
## 2014-10-14 NOTE — Progress Notes (Signed)
Unit CM UR Completed by [facility] ED CM  W. Hoyle Barkdull RN  
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
## 2014-10-14 NOTE — Progress Notes (Signed)
Returned pt to room had accompanied pt to MRI and given 1 mg of Ativan IV pt unable to cooperative to have MRI done

## 2014-10-14 NOTE — ED Notes (Signed)
Water given  

## 2014-10-14 NOTE — ED Notes (Signed)
Per EMS, Patient's neighbor came to check on him and found that patient was dry heaving, nausea, vomiting, and having trouble ambulating. Patient is having trouble following simple commands. Neighbor states patient's pupils are not normally different, but patient confirms that he is blind in the right eye where pupil is significantly bigger. Patient denies any recent falls. Last seen normal was 2000 last night. Patient reports dry heaving in the morning is normal. Last drink was 1.5 days ago and a conservative day is 12 beers/day along with moonshine and  Liquor throughout the week. Vitals per EMS: 162/89, 161 CBG, 97 HR, 91 % on RA with HX of COPD. Negative Stroke Screen, No seizure like activity. 4 mg of Zofran given.

## 2014-10-15 ENCOUNTER — Inpatient Hospital Stay (HOSPITAL_COMMUNITY): Payer: Medicare Other

## 2014-10-15 DIAGNOSIS — I6789 Other cerebrovascular disease: Secondary | ICD-10-CM

## 2014-10-15 DIAGNOSIS — I679 Cerebrovascular disease, unspecified: Secondary | ICD-10-CM

## 2014-10-15 DIAGNOSIS — G9340 Encephalopathy, unspecified: Principal | ICD-10-CM

## 2014-10-15 DIAGNOSIS — I639 Cerebral infarction, unspecified: Secondary | ICD-10-CM

## 2014-10-15 DIAGNOSIS — R569 Unspecified convulsions: Secondary | ICD-10-CM

## 2014-10-15 LAB — MRSA PCR SCREENING: MRSA BY PCR: NEGATIVE

## 2014-10-15 LAB — COMPREHENSIVE METABOLIC PANEL
ALT: 16 U/L (ref 0–53)
AST: 35 U/L (ref 0–37)
Albumin: 3.5 g/dL (ref 3.5–5.2)
Alkaline Phosphatase: 49 U/L (ref 39–117)
Anion gap: 9 (ref 5–15)
BUN: 9 mg/dL (ref 6–23)
CHLORIDE: 101 meq/L (ref 96–112)
CO2: 23 mmol/L (ref 19–32)
Calcium: 8.8 mg/dL (ref 8.4–10.5)
Creatinine, Ser: 0.98 mg/dL (ref 0.50–1.35)
GFR calc Af Amer: >90 mL/min (ref >90)
GFR, EST NON AFRICAN AMERICAN: 80 mL/min — AB (ref >90)
GLUCOSE: 96 mg/dL (ref 70–99)
Potassium: 4 mmol/L (ref 3.5–5.1)
SODIUM: 133 mmol/L — AB (ref 135–145)
TOTAL PROTEIN: 5.6 g/dL — AB (ref 6.0–8.3)
Total Bilirubin: 1.6 mg/dL — ABNORMAL HIGH (ref 0.3–1.2)

## 2014-10-15 LAB — GLUCOSE, CAPILLARY
GLUCOSE-CAPILLARY: 95 mg/dL (ref 70–99)
GLUCOSE-CAPILLARY: 111 mg/dL — AB (ref 70–99)
Glucose-Capillary: 69 mg/dL — ABNORMAL LOW (ref 70–99)
Glucose-Capillary: 72 mg/dL (ref 70–99)
Glucose-Capillary: 111 mg/dL — ABNORMAL HIGH (ref 70–99)

## 2014-10-15 LAB — LIPID PANEL
CHOLESTEROL: 125 mg/dL (ref 0–200)
HDL: 62 mg/dL (ref >39)
LDL Cholesterol: 45 mg/dL (ref 0–99)
Total CHOL/HDL Ratio: 2 RATIO
Triglycerides: 89 mg/dL (ref <150)
VLDL: 18 mg/dL (ref 0–40)

## 2014-10-15 LAB — CBC
HCT: 39.8 % (ref 39.0–52.0)
HEMOGLOBIN: 13.9 g/dL (ref 13.0–17.0)
MCH: 35.7 pg — AB (ref 26.0–34.0)
MCHC: 34.9 g/dL (ref 30.0–36.0)
MCV: 102.3 fL — ABNORMAL HIGH (ref 78.0–100.0)
PLATELETS: 133 10*3/uL — AB (ref 150–400)
RBC: 3.89 MIL/uL — ABNORMAL LOW (ref 4.22–5.81)
RDW: 13.6 % (ref 11.5–15.5)
WBC: 10.3 10*3/uL (ref 4.0–10.5)

## 2014-10-15 LAB — HEMOGLOBIN A1C
HEMOGLOBIN A1C: 4.7 % (ref <5.7)
MEAN PLASMA GLUCOSE: 88 mg/dL (ref <117)

## 2014-10-15 MED ORDER — ATENOLOL 25 MG PO TABS
25.0000 mg | ORAL_TABLET | Freq: Every day | ORAL | Status: DC
  Administered 2014-10-16 – 2014-10-17 (×2): 25 mg via ORAL
  Filled 2014-10-15 (×3): qty 1

## 2014-10-15 MED ORDER — PANTOPRAZOLE SODIUM 40 MG PO TBEC
40.0000 mg | DELAYED_RELEASE_TABLET | Freq: Every day | ORAL | Status: DC
  Administered 2014-10-15 – 2014-10-16 (×2): 40 mg via ORAL
  Filled 2014-10-15 (×2): qty 1

## 2014-10-15 MED ORDER — SODIUM CHLORIDE 0.9 % IV SOLN
INTRAVENOUS | Status: AC
  Administered 2014-10-15: 50 mL/h via INTRAVENOUS
  Administered 2014-10-15: 15:00:00 via INTRAVENOUS
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
## 2014-10-15 NOTE — Progress Notes (Addendum)
Subjective: Patient sleeping in restraints on my entering the room but easily awakened.  No complaints.    Objective: Current vital signs: BP 116/60 mmHg  Pulse 66  Temp(Src) 98.2 F (36.8 C) (Oral)  Resp 23  Ht 5' 10.5" (1.791 m)  Wt 69.4 kg (153 lb)  BMI 21.64 kg/m2  SpO2 99% Vital signs in last 24 hours: Temp:  [97.8 F (36.6 C)-98.5 F (36.9 C)] 98.2 F (36.8 C) (01/19 6720) Pulse Rate:  [55-89] 66 (01/19 0808) Resp:  [14-29] 23 (01/19 0808) BP: (93-156)/(52-134) 116/60 mmHg (01/19 0808) SpO2:  [89 %-99 %] 99 % (01/19 0808) Weight:  [68.2 kg (150 lb 5.7 oz)-69.4 kg (153 lb)] 69.4 kg (153 lb) (01/19 0500)  Intake/Output from previous day: 01/18 0701 - 01/19 0700 In: 1418.8 [I.V.:1418.8] Out: 550 [Urine:550] Intake/Output this shift: Total I/O In: -  Out: 200 [Urine:200] Nutritional status: Diet heart healthy/carb modified  Neurologic Exam: Mental Status: Alert, oriented to place.  Speech fluent without evidence of aphasia with some slurring noted.  Able to follow 3 step commands without difficulty.  In restraints Cranial Nerves: II: Discs flat bilaterally; ?LHH, right pupil irregular and unreactive, left pupil 70mm and reactive III,IV, VI: ptosis not present, extra-ocular motions intact bilaterally V,VII: left facial droop, facial light touch sensation normal bilaterally VIII: hearing normal bilaterally IX,X: gag reflex present XI: bilateral shoulder shrug XII: right tongue deviation Motor: Right : Upper extremity   5/5    Left:     Upper extremity   5/5  Lower extremity   5/5     Lower extremity   5/5 Tone and bulk:normal tone throughout; no atrophy noted Sensory: Pinprick and light touch intact throughout, bilaterally Deep Tendon Reflexes: 1+ and symmetric throughout Plantars: Right: downgoing   Left: downgoing  Lab Results: Basic Metabolic Panel:  Recent Labs Lab 10/14/14 1213 10/14/14 2116 10/15/14 0317  NA 137 131* 133*  K 4.8 4.3 4.0  CL 102 97  101  CO2 19 27 23   GLUCOSE 185* 96 96  BUN 11 9 9   CREATININE 1.10 1.11 0.98  CALCIUM 9.6 8.9 8.8  MG 2.2  --   --     Liver Function Tests:  Recent Labs Lab 10/14/14 1213 10/15/14 0317  AST 45* 35  ALT 20 16  ALKPHOS 71 49  BILITOT 1.2 1.6*  PROT 6.8 5.6*  ALBUMIN 4.1 3.5    Recent Labs Lab 10/14/14 1213  LIPASE 30   No results for input(s): AMMONIA in the last 168 hours.  CBC:  Recent Labs Lab 10/14/14 1213 10/15/14 0317  WBC 18.8* 10.3  NEUTROABS 17.3*  --   HGB 18.0* 13.9  HCT 51.2 39.8  MCV 103.6* 102.3*  PLT 144* 133*    Cardiac Enzymes: No results for input(s): CKTOTAL, CKMB, CKMBINDEX, TROPONINI in the last 168 hours.  Lipid Panel: No results for input(s): CHOL, TRIG, HDL, CHOLHDL, VLDL, LDLCALC in the last 168 hours.  CBG:  Recent Labs Lab 10/14/14 2233  GLUCAP 96    Microbiology: Results for orders placed or performed during the hospital encounter of 10/14/14  MRSA PCR Screening     Status: None   Collection Time: 10/14/14  6:46 PM  Result Value Ref Range Status   MRSA by PCR NEGATIVE NEGATIVE Final    Comment:        The GeneXpert MRSA Assay (FDA approved for NASAL specimens only), is one component of a comprehensive MRSA colonization surveillance program. It is not intended  to diagnose MRSA infection nor to guide or monitor treatment for MRSA infections.     Coagulation Studies:  Recent Labs  10/14/14 1706  LABPROT 13.7  INR 1.04    Imaging: Ct Head Wo Contrast  10/14/2014   CLINICAL DATA:  Altered mental status changes starting today.  EXAM: CT HEAD WITHOUT CONTRAST  TECHNIQUE: Contiguous axial images were obtained from the base of the skull through the vertex without intravenous contrast.  COMPARISON:  05/08/2013  FINDINGS: There is no evidence for acute hemorrhage, hydrocephalus, mass lesion, or abnormal extra-axial fluid collection. No definite CT evidence for acute infarction. Diffuse loss of parenchymal volume  is consistent with atrophy. Patchy low attenuation in the deep hemispheric and periventricular white matter is nonspecific, but likely reflects chronic microvascular ischemic demyelination. The visualized paranasal sinuses and mastoid air cells are clear.  IMPRESSION: Stable exam. Atrophy with chronic small vessel white matter ischemic demyelination. No acute findings.   Electronically Signed   By: Misty Stanley M.D.   On: 10/14/2014 12:55   Dg Chest Portable 1 View  10/14/2014   CLINICAL DATA:  74 year old hypertensive diabetic alcohol male with COPD and history of smoking. Vomiting. Initial encounter.  EXAM: PORTABLE CHEST - 1 VIEW  COMPARISON:  07/04/2012 chest x-ray.  02/04/2009 PET-CT.  FINDINGS: Multiple remote bilateral rib fractures.  Skin fold right lung apex without evidence gross pneumothorax.  No evidence of pulmonary edema or segmental consolidation.  PET CT detected parenchymal changes not appreciated on current exam. This can be assessed on followup two view exam with cardiac leads removed when patient is able.  Calcified aorta.  Heart size within normal limits.  : No acute abnormality.  Please see above discussion.   Electronically Signed   By: Chauncey Cruel M.D.   On: 10/14/2014 16:20    Medications:  I have reviewed the patient's current medications. Scheduled: . aspirin EC  325 mg Oral Daily  . atenolol  25 mg Oral Daily  . enoxaparin (LOVENOX) injection  40 mg Subcutaneous Q24H  . folic acid  1 mg Oral Daily  . gemfibrozil  600 mg Oral BID  . insulin aspart  0-9 Units Subcutaneous TID WC  . LORazepam  0-4 mg Oral Q6H   Followed by  . [START ON 10/16/2014] LORazepam  0-4 mg Oral Q12H  . losartan  50 mg Oral Daily  . multivitamin with minerals  1 tablet Oral Daily  . pantoprazole (PROTONIX) IV  40 mg Intravenous Q24H  . sodium chloride  3 mL Intravenous Q12H  . thiamine  100 mg Oral Daily   Or  . thiamine  100 mg Intravenous Daily    Assessment/Plan: Patient with altered  mental status.  Suspect there are multiple factors contributing including a possible acute infarct and ETOH withdrawal.  Patient unable to tolerate MRI.  There are focal findings on neurological examination therefore would treat as if a stroke.    Recommendations: 1.  Agree with ASA 2.  Echocardiogram 3.  Carotid dopplers 4.  A1c, fasting lipid panel 5.  PT and OT evaluations 6.  Would repeat MRI once patient more appropriate cognitively.   7.  Agree with CIWA  Case discussed with Dr. Algis Liming    LOS: 1 day   Alexis Goodell, MD [facility] [phone] 10/15/2014  8:57 AM

## 2014-10-15 NOTE — Progress Notes (Signed)
EEG Completed; Results Pending  

## 2014-10-15 NOTE — Progress Notes (Signed)
  Echocardiogram 2D Echocardiogram has been performed.  Blake [name] 10/15/2014, 1:44 PM

## 2014-10-15 NOTE — Procedures (Signed)
ELECTROENCEPHALOGRAM REPORT   Patient: Blake [name]       Room #: 2C-16 EEG No. ID:  Age: 74 y.o.        Sex: male Referring Physician: Dr Algis Liming Report Date:  10/15/2014        Interpreting Physician: Jim Like, Larkin Ina  History: [name] is an 74 y.o. male admitted with altered mental status   Conditions of Recording:  This is a 16 channel EEG carried out with the patient in the drowsy state.  Description:  The waking background activity consists predominantly of a low voltage, symmetrical,poorly organized theta activity. There is a poorly sustained alpha rhythm in hte 9-10Hz  range intermittently noted in the parieto-occipital region.  Low voltage fast activity, poorly organized, is seen anteriorly and is at times superimposed on more posterior regions.  A mixture of theta and alpha rhythms are seen from the central and temporal regions. No focal slowing, epileptiform activity or electrographic seizures noted.  The patient drowses with slowing to irregular, low voltage theta and beta activity.  Stage II sleep is not obtained.   Hyperventilation was not performed. Intermittent photic stimulation was performed but failed to illicit any change in the tracing.     IMPRESSION: Normal electroencephalogram.There are no focal lateralizing or epileptiform features. This does not rule out the possibility of an underlying seizure disorder.   Jim Like, DO [facility] [phone]  If 7pm- 7am, please page neurology on call as listed in [place]. 10/15/2014, 2:45 PM

## 2014-10-15 NOTE — Progress Notes (Signed)
Pt had two 2.13 second pause. Pt is asymptomatic, sleeping soundly. Tylene Fantasia, NP notified. No orders received at this time. Strip in pt's chart. Will continue to closely monitor pt.

## 2014-10-16 DIAGNOSIS — I639 Cerebral infarction, unspecified: Secondary | ICD-10-CM

## 2014-10-16 LAB — BASIC METABOLIC PANEL
Anion gap: 4 — ABNORMAL LOW (ref 5–15)
BUN: 9 mg/dL (ref 6–23)
CHLORIDE: 105 meq/L (ref 96–112)
CO2: 28 mmol/L (ref 19–32)
Calcium: 8.8 mg/dL (ref 8.4–10.5)
Creatinine, Ser: 1.06 mg/dL (ref 0.50–1.35)
GFR calc Af Amer: 78 mL/min — ABNORMAL LOW (ref >90)
GFR, EST NON AFRICAN AMERICAN: 68 mL/min — AB (ref >90)
GLUCOSE: 113 mg/dL — AB (ref 70–99)
POTASSIUM: 3.8 mmol/L (ref 3.5–5.1)
SODIUM: 137 mmol/L (ref 135–145)

## 2014-10-16 LAB — GLUCOSE, CAPILLARY
GLUCOSE-CAPILLARY: 102 mg/dL — AB (ref 70–99)
Glucose-Capillary: 97 mg/dL (ref 70–99)
Glucose-Capillary: 89 mg/dL (ref 70–99)

## 2014-10-16 LAB — CBC
HEMATOCRIT: 39 % (ref 39.0–52.0)
Hemoglobin: 13.5 g/dL (ref 13.0–17.0)
MCH: 34.7 pg — ABNORMAL HIGH (ref 26.0–34.0)
MCHC: 34.6 g/dL (ref 30.0–36.0)
MCV: 100.3 fL — ABNORMAL HIGH (ref 78.0–100.0)
Platelets: 111 10*3/uL — ABNORMAL LOW (ref 150–400)
RBC: 3.89 MIL/uL — ABNORMAL LOW (ref 4.22–5.81)
RDW: 13.5 % (ref 11.5–15.5)
WBC: 8 10*3/uL (ref 4.0–10.5)

## 2014-10-16 NOTE — Progress Notes (Signed)
[facility] PROGRESS NOTE  Blake [name] YIR:[id number] DOB: 1941/01/11 DOA: 10/14/2014 PCP: Noralee Space, MD  Assessment/Plan: 74 y.o. male with history of heavy alcohol abuse, tobacco abuse, COPD, essential hypertension, HLD, GERD, pulmonary nodule,? DM (patient denies but listed) presented to ED with confusion and unsteady gait. Patient unable to provide a coherent history secondary to confusion.  -He was admitted to step down unit for acute encephalopathy.    1.Acute encephalopathy thought possible due to Alcohol withdrawal delirium, alcohol related seizures with postictal state with possible underlying dementia.  -encephalopathy resolved, mental status at baseline; neuro exam no focal; EEG: unremarkable; MRI: no acute findings; d/w patient, to cut down on ETOH 2. Dehydration: Improved with IV fluids. Encouraged oral intake. 3. lactic acidosis: Possibly related to seizures, alcohol abuse. Improved after IV fluid hydration. 4. Essential hypertension: Soft blood pressures. Intermittent sinus bradycardia. We will reduce dose of atenolol. Continue losartan. Monitor 5. COPD: Stable, cont bronchodilators prn  6. Leukocytosis: Likely stress response. Resolved. No clinical focus of sepsis.  Obtain PT eval; TF to RNF     Code Status: full Family Communication: d/w patient, no family at the bedside  (indicate person spoken with, relationship, and if by phone, the number) Disposition Plan: home 24-48 hrs    Consultants:  Neurology   Procedures:  EEG  Antibiotics:  none (indicate start date, and stop date if known)  HPI/Subjective: alert  Objective: Filed Vitals:   10/16/14 0813  BP: 114/55  Pulse: 75  Temp: 98.4 F (36.9 C)  Resp: 19    Intake/Output Summary (Last 24 hours) at 10/16/14 0900 Last data filed at 10/16/14 0500  Gross per 24 hour  Intake  802.5 ml  Output   2500 ml  Net -1697.5 ml   Filed Weights   10/14/14 1748 10/15/14 0500 10/16/14 0500   Weight: 68.2 kg (150 lb 5.7 oz) 69.4 kg (153 lb) 71 kg (156 lb 8.4 oz)    Exam:   General:  Alert, oriented   Cardiovascular: s1,s2 rrr  Respiratory: CTA BL  Abdomen: soft, nt,nd   Musculoskeletal: no LE edema   Data Reviewed: Basic Metabolic Panel:  Recent Labs Lab 10/14/14 1213 10/14/14 2116 10/15/14 0317 10/16/14 0338  NA 137 131* 133* 137  K 4.8 4.3 4.0 3.8  CL 102 97 101 105  CO2 19 27 23 28   GLUCOSE 185* 96 96 113*  BUN 11 9 9 9   CREATININE 1.10 1.11 0.98 1.06  CALCIUM 9.6 8.9 8.8 8.8  MG 2.2  --   --   --    Liver Function Tests:  Recent Labs Lab 10/14/14 1213 10/15/14 0317  AST 45* 35  ALT 20 16  ALKPHOS 71 49  BILITOT 1.2 1.6*  PROT 6.8 5.6*  ALBUMIN 4.1 3.5    Recent Labs Lab 10/14/14 1213  LIPASE 30   No results for input(s): AMMONIA in the last 168 hours. CBC:  Recent Labs Lab 10/14/14 1213 10/15/14 0317 10/16/14 0338  WBC 18.8* 10.3 8.0  NEUTROABS 17.3*  --   --   HGB 18.0* 13.9 13.5  HCT 51.2 39.8 39.0  MCV 103.6* 102.3* 100.3*  PLT 144* 133* 111*   Cardiac Enzymes: No results for input(s): CKTOTAL, CKMB, CKMBINDEX, TROPONINI in the last 168 hours. BNP (last 3 results) No results for input(s): PROBNP in the last 8760 hours. CBG:  Recent Labs Lab 10/15/14 0931 10/15/14 1238 10/15/14 1721 10/15/14 2025 10/16/14 0811  GLUCAP 95 72 111* 111* 102*  Recent Results (from the past 240 hour(s))  MRSA PCR Screening     Status: None   Collection Time: 10/14/14  6:46 PM  Result Value Ref Range Status   MRSA by PCR NEGATIVE NEGATIVE Final    Comment:        The GeneXpert MRSA Assay (FDA approved for NASAL specimens only), is one component of a comprehensive MRSA colonization surveillance program. It is not intended to diagnose MRSA infection nor to guide or monitor treatment for MRSA infections.      Studies: Ct Head Wo Contrast  10/14/2014   CLINICAL DATA:  Altered mental status changes starting today.   EXAM: CT HEAD WITHOUT CONTRAST  TECHNIQUE: Contiguous axial images were obtained from the base of the skull through the vertex without intravenous contrast.  COMPARISON:  05/08/2013  FINDINGS: There is no evidence for acute hemorrhage, hydrocephalus, mass lesion, or abnormal extra-axial fluid collection. No definite CT evidence for acute infarction. Diffuse loss of parenchymal volume is consistent with atrophy. Patchy low attenuation in the deep hemispheric and periventricular white matter is nonspecific, but likely reflects chronic microvascular ischemic demyelination. The visualized paranasal sinuses and mastoid air cells are clear.  IMPRESSION: Stable exam. Atrophy with chronic small vessel white matter ischemic demyelination. No acute findings.   Electronically Signed   By: Misty Stanley M.D.   On: 10/14/2014 12:55   Mr [name] Wo Contrast  10/15/2014   CLINICAL DATA:  74 year old male with altered mental status, acute encephalopathy. ETOH withdrawal. Initial encounter.  EXAM: MRI HEAD WITHOUT CONTRAST  MRA HEAD WITHOUT CONTRAST  TECHNIQUE: Multiplanar, multiecho pulse sequences of the brain and surrounding structures were obtained without intravenous contrast. Angiographic images of the head were obtained using MRA technique without contrast.  COMPARISON:  Head CT without contrast 10/14/2014. Cervical spine MRI 05/21/2013.  FINDINGS: MRI HEAD FINDINGS  Study is intermittently degraded by motion artifact despite repeated imaging attempts.  No restricted diffusion to suggest acute infarction. No midline shift, mass effect, evidence of mass lesion, ventriculomegaly, extra-axial collection or acute intracranial hemorrhage. Cervicomedullary junction and pituitary are within normal limits. Major intracranial vascular flow voids are preserved. Mild to moderate for age scattered mostly periventricular cerebral white matter T2 and FLAIR hyperintensity. No cortical encephalomalacia. No cortical encephalomalacia. Deep  gray matter nuclei, brainstem and cerebellum within normal limits for age.  Visible internal auditory structures appear normal. Visualized paranasal sinuses and mastoids are clear. Postoperative changes to the globes. Visualized scalp soft tissues are within normal limits. Normal bone marrow signal.  Degenerative changes in the visualized cervical spine.  MRA HEAD FINDINGS  Antegrade flow in the posterior circulation. Dominant appearing distal right vertebral artery. Normal right PICA. Dominant appearing left AICA. Patent vertebrobasilar junction. No basilar stenosis. SCA and left PCA origins are within normal limits. Fetal type right PCA origin. Motion artifact at the level of the PCAs. Bilateral PCA branches are within normal limits allowing for motion.  Antegrade flow in both ICA siphons No siphon stenosis. Ophthalmic and right posterior communicating artery origins are within normal limits. Left posterior communicating artery diminutive or absent.  Motion artifact at the carotid termini which are patent. MCA and ACA origins are patent. Visualized bilateral MCA and ACA branches are within normal limits allowing for motion.  IMPRESSION: 1. No acute intracranial abnormality. Mild to moderate nonspecific white matter signal changes. 2. Negative intracranial MRA allowing for motion artifact.   Electronically Signed   By: Lars Pinks M.D.   On: 10/15/2014 16:41  Mr Brain Wo Contrast  10/15/2014   CLINICAL DATA:  74 year old male with altered mental status, acute encephalopathy. ETOH withdrawal. Initial encounter.  EXAM: MRI HEAD WITHOUT CONTRAST  MRA HEAD WITHOUT CONTRAST  TECHNIQUE: Multiplanar, multiecho pulse sequences of the brain and surrounding structures were obtained without intravenous contrast. Angiographic images of the head were obtained using MRA technique without contrast.  COMPARISON:  Head CT without contrast 10/14/2014. Cervical spine MRI 05/21/2013.  FINDINGS: MRI HEAD FINDINGS  Study is  intermittently degraded by motion artifact despite repeated imaging attempts.  No restricted diffusion to suggest acute infarction. No midline shift, mass effect, evidence of mass lesion, ventriculomegaly, extra-axial collection or acute intracranial hemorrhage. Cervicomedullary junction and pituitary are within normal limits. Major intracranial vascular flow voids are preserved. Mild to moderate for age scattered mostly periventricular cerebral white matter T2 and FLAIR hyperintensity. No cortical encephalomalacia. No cortical encephalomalacia. Deep gray matter nuclei, brainstem and cerebellum within normal limits for age.  Visible internal auditory structures appear normal. Visualized paranasal sinuses and mastoids are clear. Postoperative changes to the globes. Visualized scalp soft tissues are within normal limits. Normal bone marrow signal.  Degenerative changes in the visualized cervical spine.  MRA HEAD FINDINGS  Antegrade flow in the posterior circulation. Dominant appearing distal right vertebral artery. Normal right PICA. Dominant appearing left AICA. Patent vertebrobasilar junction. No basilar stenosis. SCA and left PCA origins are within normal limits. Fetal type right PCA origin. Motion artifact at the level of the PCAs. Bilateral PCA branches are within normal limits allowing for motion.  Antegrade flow in both ICA siphons No siphon stenosis. Ophthalmic and right posterior communicating artery origins are within normal limits. Left posterior communicating artery diminutive or absent.  Motion artifact at the carotid termini which are patent. MCA and ACA origins are patent. Visualized bilateral MCA and ACA branches are within normal limits allowing for motion.  IMPRESSION: 1. No acute intracranial abnormality. Mild to moderate nonspecific white matter signal changes. 2. Negative intracranial MRA allowing for motion artifact.   Electronically Signed   By: Lars Pinks M.D.   On: 10/15/2014 16:41   Dg Chest  Portable 1 View  10/14/2014   CLINICAL DATA:  74 year old hypertensive diabetic alcohol male with COPD and history of smoking. Vomiting. Initial encounter.  EXAM: PORTABLE CHEST - 1 VIEW  COMPARISON:  07/04/2012 chest x-ray.  02/04/2009 PET-CT.  FINDINGS: Multiple remote bilateral rib fractures.  Skin fold right lung apex without evidence gross pneumothorax.  No evidence of pulmonary edema or segmental consolidation.  PET CT detected parenchymal changes not appreciated on current exam. This can be assessed on followup two view exam with cardiac leads removed when patient is able.  Calcified aorta.  Heart size within normal limits.  : No acute abnormality.  Please see above discussion.   Electronically Signed   By: Chauncey Cruel M.D.   On: 10/14/2014 16:20    Scheduled Meds: . aspirin EC  325 mg Oral Daily  . atenolol  25 mg Oral Daily  . enoxaparin (LOVENOX) injection  40 mg Subcutaneous Q24H  . folic acid  1 mg Oral Daily  . gemfibrozil  600 mg Oral BID  . LORazepam  0-4 mg Oral Q6H   Followed by  . LORazepam  0-4 mg Oral Q12H  . losartan  50 mg Oral Daily  . multivitamin with minerals  1 tablet Oral Daily  . pantoprazole  40 mg Oral QHS  . sodium chloride  3 mL Intravenous Q12H  .  thiamine  100 mg Oral Daily   Continuous Infusions:   Principal Problem:   Acute encephalopathy Active Problems:   Type 2 diabetes mellitus   Dyslipidemia   CIGARETTE SMOKER   Essential hypertension   COPD (chronic obstructive pulmonary disease)   GERD   ETOH abuse   Cerebrovascular small vessel disease   Unsteady gait   Alcohol withdrawal delirium    Time spent: >35 minutes     Kinnie Feil  [facility] Pager [phone]. If 7PM-7AM, please contact night-coverage at [email or web address], password [facility] 10/16/2014, 9:00 AM  LOS: 2 days

## 2014-10-16 NOTE — Progress Notes (Signed)
Subjective: Patient without complaints.  Remains not fully oriented but appears more cooperative.  Objective: Current vital signs: BP 160/78 mmHg  Pulse 94  Temp(Src) 98.4 F (36.9 C) (Oral)  Resp 19  Ht 5' 10.5" (1.791 m)  Wt 71 kg (156 lb 8.4 oz)  BMI 22.13 kg/m2  SpO2 98% Vital signs in last 24 hours: Temp:  [97.9 F (36.6 C)-99 F (37.2 C)] 98.4 F (36.9 C) (01/20 0813) Pulse Rate:  [48-94] 94 (01/20 0938) Resp:  [16-24] 19 (01/20 0813) BP: (73-160)/(48-78) 160/78 mmHg (01/20 0938) SpO2:  [94 %-100 %] 98 % (01/20 0813) Weight:  [71 kg (156 lb 8.4 oz)] 71 kg (156 lb 8.4 oz) (01/20 0500)  Intake/Output from previous day: 01/19 0701 - 01/20 0700 In: 1177.5 [I.V.:1177.5] Out: 2700 [Urine:2700] Intake/Output this shift:   Nutritional status: Diet heart healthy/carb modified  Neurologic Exam: Mental Status: Alert, oriented to place, name and month.  Reports that it is 2019. Speech fluent without evidence of aphasia with some slurring noted. Able to follow 3 step commands without difficulty. Cranial Nerves: II: Discs flat bilaterally; VFF, right pupil irregular and unreactive, left pupil 17mm and reactive III,IV, VI: ptosis not present, extra-ocular motions intact bilaterally V,VII: smile symmetric, facial light touch sensation normal bilaterally VIII: hearing normal bilaterally IX,X: gag reflex present XI: bilateral shoulder shrug XII: midline tongue extension Motor: Right :Upper extremity 5/5Left: Upper extremity 5/5 Lower extremity 5/5Lower extremity 5/5 Tone and bulk:normal tone throughout; no atrophy noted Sensory: Pinprick and light touch intact throughout, bilaterally Deep Tendon Reflexes: 1+ and symmetric throughout Plantars: Right: downgoingLeft: downgoing  Lab Results: Basic Metabolic Panel:  Recent Labs Lab  10/14/14 1213 10/14/14 2116 10/15/14 0317 10/16/14 0338  NA 137 131* 133* 137  K 4.8 4.3 4.0 3.8  CL 102 97 101 105  CO2 19 27 23 28   GLUCOSE 185* 96 96 113*  BUN 11 9 9 9   CREATININE 1.10 1.11 0.98 1.06  CALCIUM 9.6 8.9 8.8 8.8  MG 2.2  --   --   --     Liver Function Tests:  Recent Labs Lab 10/14/14 1213 10/15/14 0317  AST 45* 35  ALT 20 16  ALKPHOS 71 49  BILITOT 1.2 1.6*  PROT 6.8 5.6*  ALBUMIN 4.1 3.5    Recent Labs Lab 10/14/14 1213  LIPASE 30   No results for input(s): AMMONIA in the last 168 hours.  CBC:  Recent Labs Lab 10/14/14 1213 10/15/14 0317 10/16/14 0338  WBC 18.8* 10.3 8.0  NEUTROABS 17.3*  --   --   HGB 18.0* 13.9 13.5  HCT 51.2 39.8 39.0  MCV 103.6* 102.3* 100.3*  PLT 144* 133* 111*    Cardiac Enzymes: No results for input(s): CKTOTAL, CKMB, CKMBINDEX, TROPONINI in the last 168 hours.  Lipid Panel:  Recent Labs Lab 10/15/14 0317  CHOL 125  TRIG 89  HDL 62  CHOLHDL 2.0  VLDL 18  LDLCALC 45    CBG:  Recent Labs Lab 10/15/14 0931 10/15/14 1238 10/15/14 1721 10/15/14 2025 10/16/14 0811  GLUCAP 95 72 111* 111* 102*    Microbiology: Results for orders placed or performed during the hospital encounter of 10/14/14  MRSA PCR Screening     Status: None   Collection Time: 10/14/14  6:46 PM  Result Value Ref Range Status   MRSA by PCR NEGATIVE NEGATIVE Final    Comment:        The GeneXpert MRSA Assay (FDA approved for NASAL specimens only), is  one component of a comprehensive MRSA colonization surveillance program. It is not intended to diagnose MRSA infection nor to guide or monitor treatment for MRSA infections.     Coagulation Studies:  Recent Labs  10/14/14 1706  LABPROT 13.7  INR 1.04    Imaging: Ct Head Wo Contrast  10/14/2014   CLINICAL DATA:  Altered mental status changes starting today.  EXAM: CT HEAD WITHOUT CONTRAST  TECHNIQUE: Contiguous axial images were obtained from the base of the  skull through the vertex without intravenous contrast.  COMPARISON:  05/08/2013  FINDINGS: There is no evidence for acute hemorrhage, hydrocephalus, mass lesion, or abnormal extra-axial fluid collection. No definite CT evidence for acute infarction. Diffuse loss of parenchymal volume is consistent with atrophy. Patchy low attenuation in the deep hemispheric and periventricular white matter is nonspecific, but likely reflects chronic microvascular ischemic demyelination. The visualized paranasal sinuses and mastoid air cells are clear.  IMPRESSION: Stable exam. Atrophy with chronic small vessel white matter ischemic demyelination. No acute findings.   Electronically Signed   By: Misty Stanley M.D.   On: 10/14/2014 12:55   Mr [name] Wo Contrast  10/15/2014   CLINICAL DATA:  74 year old male with altered mental status, acute encephalopathy. ETOH withdrawal. Initial encounter.  EXAM: MRI HEAD WITHOUT CONTRAST  MRA HEAD WITHOUT CONTRAST  TECHNIQUE: Multiplanar, multiecho pulse sequences of the brain and surrounding structures were obtained without intravenous contrast. Angiographic images of the head were obtained using MRA technique without contrast.  COMPARISON:  Head CT without contrast 10/14/2014. Cervical spine MRI 05/21/2013.  FINDINGS: MRI HEAD FINDINGS  Study is intermittently degraded by motion artifact despite repeated imaging attempts.  No restricted diffusion to suggest acute infarction. No midline shift, mass effect, evidence of mass lesion, ventriculomegaly, extra-axial collection or acute intracranial hemorrhage. Cervicomedullary junction and pituitary are within normal limits. Major intracranial vascular flow voids are preserved. Mild to moderate for age scattered mostly periventricular cerebral white matter T2 and FLAIR hyperintensity. No cortical encephalomalacia. No cortical encephalomalacia. Deep gray matter nuclei, brainstem and cerebellum within normal limits for age.  Visible internal auditory  structures appear normal. Visualized paranasal sinuses and mastoids are clear. Postoperative changes to the globes. Visualized scalp soft tissues are within normal limits. Normal bone marrow signal.  Degenerative changes in the visualized cervical spine.  MRA HEAD FINDINGS  Antegrade flow in the posterior circulation. Dominant appearing distal right vertebral artery. Normal right PICA. Dominant appearing left AICA. Patent vertebrobasilar junction. No basilar stenosis. SCA and left PCA origins are within normal limits. Fetal type right PCA origin. Motion artifact at the level of the PCAs. Bilateral PCA branches are within normal limits allowing for motion.  Antegrade flow in both ICA siphons No siphon stenosis. Ophthalmic and right posterior communicating artery origins are within normal limits. Left posterior communicating artery diminutive or absent.  Motion artifact at the carotid termini which are patent. MCA and ACA origins are patent. Visualized bilateral MCA and ACA branches are within normal limits allowing for motion.  IMPRESSION: 1. No acute intracranial abnormality. Mild to moderate nonspecific white matter signal changes. 2. Negative intracranial MRA allowing for motion artifact.   Electronically Signed   By: Lars Pinks M.D.   On: 10/15/2014 16:41   Mr Brain Wo Contrast  10/15/2014   CLINICAL DATA:  74 year old male with altered mental status, acute encephalopathy. ETOH withdrawal. Initial encounter.  EXAM: MRI HEAD WITHOUT CONTRAST  MRA HEAD WITHOUT CONTRAST  TECHNIQUE: Multiplanar, multiecho pulse sequences of the brain  and surrounding structures were obtained without intravenous contrast. Angiographic images of the head were obtained using MRA technique without contrast.  COMPARISON:  Head CT without contrast 10/14/2014. Cervical spine MRI 05/21/2013.  FINDINGS: MRI HEAD FINDINGS  Study is intermittently degraded by motion artifact despite repeated imaging attempts.  No restricted diffusion to  suggest acute infarction. No midline shift, mass effect, evidence of mass lesion, ventriculomegaly, extra-axial collection or acute intracranial hemorrhage. Cervicomedullary junction and pituitary are within normal limits. Major intracranial vascular flow voids are preserved. Mild to moderate for age scattered mostly periventricular cerebral white matter T2 and FLAIR hyperintensity. No cortical encephalomalacia. No cortical encephalomalacia. Deep gray matter nuclei, brainstem and cerebellum within normal limits for age.  Visible internal auditory structures appear normal. Visualized paranasal sinuses and mastoids are clear. Postoperative changes to the globes. Visualized scalp soft tissues are within normal limits. Normal bone marrow signal.  Degenerative changes in the visualized cervical spine.  MRA HEAD FINDINGS  Antegrade flow in the posterior circulation. Dominant appearing distal right vertebral artery. Normal right PICA. Dominant appearing left AICA. Patent vertebrobasilar junction. No basilar stenosis. SCA and left PCA origins are within normal limits. Fetal type right PCA origin. Motion artifact at the level of the PCAs. Bilateral PCA branches are within normal limits allowing for motion.  Antegrade flow in both ICA siphons No siphon stenosis. Ophthalmic and right posterior communicating artery origins are within normal limits. Left posterior communicating artery diminutive or absent.  Motion artifact at the carotid termini which are patent. MCA and ACA origins are patent. Visualized bilateral MCA and ACA branches are within normal limits allowing for motion.  IMPRESSION: 1. No acute intracranial abnormality. Mild to moderate nonspecific white matter signal changes. 2. Negative intracranial MRA allowing for motion artifact.   Electronically Signed   By: Lars Pinks M.D.   On: 10/15/2014 16:41   Dg Chest Portable 1 View  10/14/2014   CLINICAL DATA:  74 year old hypertensive diabetic alcohol male with COPD  and history of smoking. Vomiting. Initial encounter.  EXAM: PORTABLE CHEST - 1 VIEW  COMPARISON:  07/04/2012 chest x-ray.  02/04/2009 PET-CT.  FINDINGS: Multiple remote bilateral rib fractures.  Skin fold right lung apex without evidence gross pneumothorax.  No evidence of pulmonary edema or segmental consolidation.  PET CT detected parenchymal changes not appreciated on current exam. This can be assessed on followup two view exam with cardiac leads removed when patient is able.  Calcified aorta.  Heart size within normal limits.  : No acute abnormality.  Please see above discussion.   Electronically Signed   By: Chauncey Cruel M.D.   On: 10/14/2014 16:20    Medications:  I have reviewed the patient's current medications. Scheduled: . aspirin EC  325 mg Oral Daily  . atenolol  25 mg Oral Daily  . enoxaparin (LOVENOX) injection  40 mg Subcutaneous Q24H  . folic acid  1 mg Oral Daily  . gemfibrozil  600 mg Oral BID  . LORazepam  0-4 mg Oral Q6H   Followed by  . LORazepam  0-4 mg Oral Q12H  . losartan  50 mg Oral Daily  . multivitamin with minerals  1 tablet Oral Daily  . pantoprazole  40 mg Oral QHS  . sodium chloride  3 mL Intravenous Q12H  . thiamine  100 mg Oral Daily    Assessment/Plan: Today patient does not appear to have any focal findings on neurological examination as compared to yesterday when there was question of left sided  deficits.  Patient remains on ASA.  EEG was unremarkable.  Echo was poor quality but showed no significant abnormalities.  Has not been able to tolerate a MRI.  A1c 4.7.  LDL 45.  Carotid doppler pending.    Recommendations: 1.  Continue ASA   LOS: 2 days   Alexis Goodell, MD [facility] [phone] 10/16/2014  10:05 AM

## 2014-10-16 NOTE — Progress Notes (Signed)
VASCULAR LAB PRELIMINARY  PRELIMINARY  PRELIMINARY  PRELIMINARY  Carotid duplex  completed.    Preliminary report:  Bilateral:  1-39% ICA stenosis.  Vertebral artery flow is antegrade.      Blake [name], RVT 10/16/2014, 11:33 AM

## 2014-10-16 NOTE — Therapy (Signed)
SLP Cancellation Note  Patient Details Name: TAI SKELLY MRN: [id number] DOB: 15-Dec-1940   Cancelled treatment:        Started administration of MoCA-B, however, pt indicated need to use the restroom. Ultrasound tech also here for carotid U/S. Will return later and continue assessment.  Celia B. Quentin Ore [facility], [place] [phone]  Shonna Chock 10/16/2014, 10:35 AM

## 2014-10-16 NOTE — Progress Notes (Signed)
Report given to [facility] RN on 6E for patient going to room 13. Central tele  Notified patient tele was discontinued.

## 2014-10-16 NOTE — Progress Notes (Signed)
Patient is  Moving per MD orders to [place] room 13. Called to give report, nurse unavailable, asked to call back.

## 2014-10-16 NOTE — Evaluation (Signed)
Physical Therapy Evaluation Patient Details Name: Blake [name] MRN: [id number] DOB: 08/06/1941 Today's Date: 10/16/2014   History of Present Illness  Pt admitted with confusion and unsteady gait, dehydration. PMH:  heavy alcohol use, COPD with tobacco use, HTN, pulmonary nodule, HLD.   Clinical Impression  Patient demonstrates deficits in functional mobility as indicated below. Will need continued skilled PT to address deficits and maximize function. Will see as indicated and progress as tolerated. OF NOTE: patient with history of frequent falls (some resulting in injury). Patient very unsteady on his feet but desires to return home.  Recommend supervision for all mobility, unlikely this will occur. Will need HHPT.    Follow Up Recommendations Supervision/Assistance - 24 hour;Home health PT     Equipment Recommendations  None recommended by PT    Recommendations for Other Services       Precautions / Restrictions Precautions Precautions: Fall Restrictions Weight Bearing Restrictions: No      Mobility  Bed Mobility Overal bed mobility: Needs Assistance Bed Mobility: Supine to Sit     Supine to sit: Min guard;HOB elevated     General bed mobility comments: increased time to come to EOB, patient with poor awareness of lines and leads during movement.   Transfers Overall transfer level: Needs assistance Equipment used: Rolling walker (2 wheeled) Transfers: Sit to/from Stand Sit to Stand: Min assist         General transfer comment: pt with loss of balance backward when initially standing and with reaching out of base of support  Ambulation/Gait Ambulation/Gait assistance: Min assist;Mod assist Ambulation Distance (Feet): 140 Feet Assistive device: Rolling walker (2 wheeled) (10 ft without device) Gait Pattern/deviations: Step-through pattern;Decreased stride length;Leaning posteriorly;Drifts right/left;Narrow base of support Gait velocity: decreased Gait velocity  interpretation: Below normal speed for age/gender General Gait Details: Patient with instability noted during ambulation, distracted and attempting to talk with hands pulling them off of the RW during mobility.  patient with posterior lean, requiring min assist to steady, when not using device patient moderate assist for stability with multiple LOB requiring increased assist to prevent fall.   Stairs            Wheelchair Mobility    Modified Rankin (Stroke Patients Only)       Balance Overall balance assessment: Needs assistance;History of Falls Sitting-balance support: Feet supported Sitting balance-Leahy Scale: Fair     Standing balance support: During functional activity Standing balance-Leahy Scale: Poor Standing balance comment: posterior loss of balance when unsupported                             Pertinent Vitals/Pain Pain Assessment: No/denies pain    Home Living Family/patient expects to be discharged to:: Private residence Living Arrangements: Alone Available Help at Discharge: Friend(s);Available PRN/intermittently Type of Home: Mobile home Home Access: Stairs to enter Entrance Stairs-Rails: Left;Right Entrance Stairs-Number of Steps: 5 Home Layout: One level Home Equipment: Walker - 2 wheels;Bedside commode;Wheelchair - manual;Cane - single point      Prior Function Level of Independence: Independent         Comments: history of frequent falls     Hand Dominance   Dominant Hand: Right    Extremity/Trunk Assessment   Upper Extremity Assessment: Overall WFL for tasks assessed (hx of humerus fx in fall on L)           Lower Extremity Assessment: Defer to PT evaluation (Simultaneous filing. User may not  have seen previous data.)         Communication   Communication: No difficulties  Cognition Arousal/Alertness: Awake/alert Behavior During Therapy: WFL for tasks assessed/performed;Impulsive Overall Cognitive Status:  Impaired/Different from baseline Area of Impairment: Attention;Safety/judgement;Awareness   Current Attention Level: Selective     Safety/Judgement: Decreased awareness of safety;Decreased awareness of deficits Awareness: Emergent        General Comments General comments (skin integrity, edema, etc.): noted bruising up and down the left arm, some noted weeping through the right IV site banadage.     Exercises        Assessment/Plan    PT Assessment Patient needs continued PT services  PT Diagnosis Difficulty walking;Abnormality of gait   PT Problem List Decreased activity tolerance;Decreased balance;Decreased mobility;Decreased knowledge of use of DME;Decreased safety awareness;Cardiopulmonary status limiting activity  PT Treatment Interventions DME instruction;Gait training;Stair training;Therapeutic activities;Functional mobility training;Balance training;Therapeutic exercise;Patient/family education   PT Goals (Current goals can be found in the Care Plan section) Acute Rehab PT Goals Patient Stated Goal: did not state, plans to return home PT Goal Formulation: With patient Time For Goal Achievement: 10/30/14 Potential to Achieve Goals: Good    Frequency Min 3X/week   Barriers to discharge Decreased caregiver support      Co-evaluation PT/OT/SLP Co-Evaluation/Treatment: Yes Reason for Co-Treatment: Necessary to address cognition/behavior during functional activity;For patient/therapist safety PT goals addressed during session: Mobility/safety with mobility;Balance;Proper use of DME OT goals addressed during session: ADL's and self-care       End of Session Equipment Utilized During Treatment: Gait belt Activity Tolerance: Patient tolerated treatment well Patient left: in bed;with call bell/phone within reach;with bed alarm set Nurse Communication: Mobility status         Time: 1735-6701 PT Time Calculation (min) (ACUTE ONLY): 32 min   Charges:   PT  Evaluation $Initial PT Evaluation Tier I: 1 Procedure PT Treatments $Gait Training: 8-22 mins   PT G CodesDuncan Dull 11/08/14, 11:05 AM Alben Deeds, PT DPT  [phone]

## 2014-10-16 NOTE — Progress Notes (Signed)
Admission note:   Arrival Method: Transfer from [facility]. Mental Status: Oriented to person, place. Stated today was 1/19. Disoriented to situation. Telemetry: Placed on box #14.   Skin: 5 skin tears on left arm, all covered with foam. Scabbed abrasions on left leg. Ecchymotic areas noted on bilat upper extremities. Scrotum reddened.   Tubes: Condom catheter in place. IV: RFA NS@50  ml/hr.  Pain: Denies.  Family: No one at bedside. Living Situation: From home alone. States he has a girlfriend who checks in on him very frequently.  Safety Measures: Bed alarm in place.  6E Orientation: Oriented to unit and surroundings. Call bell within reach.  Joellen Jersey, RN.

## 2014-10-16 NOTE — Evaluation (Signed)
Occupational Therapy Evaluation Patient Details Name: OSMIN WELZ MRN: [id number] DOB: 1941-07-12 Today's Date: 10/16/2014    History of Present Illness Pt admitted with confusion and unsteady gait, dehydration. PMH:  heavy alcohol use, COPD with tobacco use, HTN, pulmonary nodule, HLD.    Clinical Impression   Pt presents with unsteady, staggering gait, decreased standing balance and poor awareness of safety interfering with independence in self care and ADL transfers.  Pt lives alone with intermittent assist of his girlfriend for housekeeping.  Will follow acutely.    Follow Up Recommendations  Home health OT    Equipment Recommendations       Recommendations for Other Services       Precautions / Restrictions Precautions Precautions: Fall Restrictions Weight Bearing Restrictions: No      Mobility Bed Mobility Overal bed mobility: Needs Assistance Bed Mobility: Supine to Sit     Supine to sit: Min guard;HOB elevated        Transfers Overall transfer level: Needs assistance Equipment used: Rolling walker (2 wheeled) Transfers: Sit to/from Stand Sit to Stand: Min assist         General transfer comment: pt with loss of balance backward when initially standing and with reaching out of base of support    Balance Overall balance assessment: Needs assistance   Sitting balance-Leahy Scale: Fair       Standing balance-Leahy Scale: Poor Standing balance comment: posterior loss of balance when unsupported                            ADL Overall ADL's : Needs assistance/impaired Eating/Feeding: Independent;Bed level Eating/Feeding Details (indicate cue type and reason): increased spillage as indicated by soiled gown and bedding. Grooming: Wash/dry hands;Sitting;Cueing for UE precautions;Standing;Minimal assistance   Upper Body Bathing: Sitting;Supervision/ safety   Lower Body Bathing: Minimal assistance;Sit to/from stand   Upper Body Dressing  : Minimal assistance;Sitting   Lower Body Dressing: Minimal assistance;Sit to/from stand   Toilet Transfer: Minimal assistance;Ambulation   Toileting- Clothing Manipulation and Hygiene: Minimal assistance;Sit to/from stand       Functional mobility during ADLs: Minimal assistance;Rolling walker General ADL Comments: Pt primarily limited by poor balance and safety awareness.     Vision                 Additional Comments: difficulty seeing small print   Perception     Praxis      Pertinent Vitals/Pain Pain Assessment: No/denies pain     Hand Dominance Right   Extremity/Trunk Assessment Upper Extremity Assessment Upper Extremity Assessment: Overall WFL for tasks assessed (hx of humerus fx in fall on L)   Lower Extremity Assessment Lower Extremity Assessment: Defer to PT evaluation       Communication Communication Communication: No difficulties   Cognition Arousal/Alertness: Awake/alert Behavior During Therapy: WFL for tasks assessed/performed Overall Cognitive Status: Impaired/Different from baseline Area of Impairment: Attention;Safety/judgement;Awareness         Safety/Judgement: Decreased awareness of safety;Decreased awareness of deficits         General Comments       Exercises       Shoulder Instructions      Home Living Family/patient expects to be discharged to:: Private residence Living Arrangements: Alone Available Help at Discharge: Friend(s);Available PRN/intermittently Type of Home: Mobile home Home Access: Stairs to enter Entrance Stairs-Number of Steps: 5 Entrance Stairs-Rails: Left;Right Home Layout: One level     Bathroom Shower/Tub: Tub/shower  unit;Walk-in shower   Bathroom Toilet: Standard     Home Equipment: Walker - 2 wheels;Bedside commode;Wheelchair - manual;Cane - single point          Prior Functioning/Environment Level of Independence: Independent        Comments: girlfriend helped with housekeeping     OT Diagnosis: Generalized weakness;Cognitive deficits   OT Problem List: Impaired balance (sitting and/or standing);Decreased safety awareness;Decreased knowledge of use of DME or AE   OT Treatment/Interventions: Self-care/ADL training;DME and/or AE instruction;Therapeutic activities;Patient/family education;Balance training    OT Goals(Current goals can be found in the care plan section) Acute Rehab OT Goals Patient Stated Goal: did not state, plans to return home OT Goal Formulation: With patient Time For Goal Achievement: 10/23/14 Potential to Achieve Goals: Good  OT Frequency: Min 2X/week   Barriers to D/C: Decreased caregiver support          Co-evaluation PT/OT/SLP Co-Evaluation/Treatment: Yes Reason for Co-Treatment: Necessary to address cognition/behavior during functional activity;For patient/therapist safety PT goals addressed during session: Mobility/safety with mobility;Balance;Proper use of DME OT goals addressed during session: ADL's and self-care      End of Session Equipment Utilized During Treatment: Gait belt;Rolling walker Nurse Communication: Mobility status  Activity Tolerance: Patient tolerated treatment well Patient left: in bed;with call bell/phone within reach;with bed alarm set   Time: 0737-1062 OT Time Calculation (min): 32 min Charges:  OT General Charges $OT Visit: 1 Procedure OT Evaluation $Initial OT Evaluation Tier I: 1 Procedure OT Treatments $Self Care/Home Management : 8-22 mins G-Codes:    Malka So 10/16/2014, 11:01 AM  [phone]

## 2014-10-16 NOTE — Evaluation (Signed)
Speech Language Pathology Evaluation Patient Details Name: Blake [name] MRN: [id number] DOB: July 20, 1941 Today's Date: 10/16/2014 Time: 1200-1230 SLP Time Calculation (min) (ACUTE ONLY): 30 min  Problem List:  Patient Active Problem List   Diagnosis Date Noted  . Acute encephalopathy 10/14/2014  . Unsteady gait 10/14/2014  . Alcohol withdrawal delirium 10/14/2014  . Lipoma of neck 12/22/2012  . Cerebral atrophy 09/28/2012  . Cerebrovascular small vessel disease 09/28/2012  . Left humeral fracture 07/04/2012  . Confusion 07/04/2012  . ETOH abuse   . COPD exacerbation 12/10/2011  . ELEVATED PROSTATE SPECIFIC ANTIGEN 09/30/2010  . COPD (chronic obstructive pulmonary disease) 01/28/2009  . PULMONARY NODULE 01/28/2009  . CIGARETTE SMOKER 02/07/2008  . INSOMNIA, CHRONIC 10/15/2007  . COLONIC POLYPS 10/10/2007  . Type 2 diabetes mellitus 10/10/2007  . Dyslipidemia 10/10/2007  . Essential hypertension 10/10/2007  . GERD 10/10/2007  . DIVERTICULOSIS OF COLON 10/10/2007   Past Medical History:  Past Medical History  Diagnosis Date  . Pulmonary nodule   . COPD (chronic obstructive pulmonary disease)   . Cigarette smoker   . Hypertension   . Hyperlipidemia   . GERD (gastroesophageal reflux disease)   . Diverticulosis of colon   . Hx of colonic polyps   . Elevated prostate specific antigen (PSA)   . Chronic insomnia   . Diabetes mellitus     no per pt, no meds taken  . ETOH abuse   . Shortness of breath    Past Surgical History:  Past Surgical History  Procedure Laterality Date  . Vasectomy    . Needle biopsy rul nodule  01/2009    benign  . Tonsillectomy  1947  . Colonoscopy     HPI:  74 year old male admitted 10/14/14 due to confusion and unsteady gait. PMH significant for ETOH abuse, COPD, frequent falls, possible underlying dementia. Orders received for cognitive evaluation.   Assessment / Plan / Recommendation Clinical Impression  The Montreal Cognitive Assessment  - Basic (MoCA-B) was administered. Pt scored 21/30, indicating mild cognitive impairment. Deficits were noted in immediate and delayed recall, orientation to day and year (stated 1926, then 2019), abstract reasoning, and thought organization. Pt able to complete alternating trail, and identify 10/10 drawings on visuoperceptual subtest. These deficits have functional impact on pt's safety at home, especially given pt's level of independence prior to admit. These higher level cognitive deficits place pt at risk for continued falls, errors of judgment, poor medication and/or financial management.  Memory impairment is especially worrisome for potential danger at home alone. 24 hour supervision is recommended after DC, with home health Speech Therapy to address specfic impairments in his home (familiar) environment.    SLP Assessment  All further Speech Lanaguage Pathology  needs can be addressed in the next venue of care    Follow Up Recommendations  Home health SLP    Frequency and Duration   n/a     Pertinent Vitals/Pain Pain Assessment: No/denies pain   SLP Goals  Progression toward goals:  (evaluation completed. Recommend folow up at next venue for safety.) Patient/Family Stated Goal: none stated  SLP Evaluation Prior Functioning  Cognitive/Linguistic Baseline: Information not available Type of Home: Mobile home  Lives With: Alone Available Help at Discharge: Friend(s);Available PRN/intermittently Education: high school graduate Vocation: Unemployed   Cognition  Overall Cognitive Status: No family/caregiver present to determine baseline cognitive functioning Arousal/Alertness: Awake/alert Orientation Level: Oriented to person;Oriented to place;Disoriented to time;Disoriented to situation Attention: Focused;Sustained;Selective Focused Attention: Appears intact Sustained Attention:  Impaired Sustained Attention Impairment: Functional basic;Verbal basic Selective Attention:  Impaired Selective Attention Impairment: Verbal basic;Functional basic Memory: Impaired Memory Impairment: Storage deficit;Retrieval deficit;Decreased Arredondo term memory Decreased Renne Term Memory: Verbal basic;Functional basic Awareness: Impaired Problem Solving: Impaired Problem Solving Impairment: Verbal basic;Functional basic Executive Function: Reasoning;Organizing Reasoning: Impaired Reasoning Impairment: Verbal basic Organizing: Impaired Organizing Impairment: Verbal basic    Comprehension  Auditory Comprehension Overall Auditory Comprehension: Appears within functional limits for tasks assessed Visual Recognition/Discrimination Discrimination: Within Function Limits Reading Comprehension Reading Status: Not tested    Expression Expression Primary Mode of Expression: Verbal Verbal Expression Overall Verbal Expression: Appears within functional limits for tasks assessed Written Expression Dominant Hand: Right Written Expression: Not tested   Oral / Motor Oral Motor/Sensory Function Overall Oral Motor/Sensory Function: Appears within functional limits for tasks assessed Motor Speech Overall Motor Speech: Appears within functional limits for tasks assessed   GO    Celia B. Quentin Ore [facility], CCC-SLP [phone]  Shonna Chock 10/16/2014, 1:59 PM

## 2014-10-17 DIAGNOSIS — J42 Unspecified chronic bronchitis: Secondary | ICD-10-CM

## 2014-10-17 LAB — GLUCOSE, CAPILLARY
GLUCOSE-CAPILLARY: 133 mg/dL — AB (ref 70–99)
GLUCOSE-CAPILLARY: 106 mg/dL — AB (ref 70–99)
Glucose-Capillary: 103 mg/dL — ABNORMAL HIGH (ref 70–99)

## 2014-10-17 MED ORDER — ASPIRIN EC 81 MG PO TBEC: 81.0000 mg | DELAYED_RELEASE_TABLET | Freq: Every day | ORAL | Status: DC

## 2014-10-17 MED ORDER — THIAMINE HCL 100 MG PO TABS: 100.0000 mg | ORAL_TABLET | Freq: Every day | ORAL | Status: DC

## 2014-10-17 NOTE — Care Management Note (Addendum)
CARE MANAGEMENT NOTE 10/17/2014  Patient:  Blake [name], Blake [name]   Account Number:  [id number]  Date Initiated:  10/17/2014  Documentation initiated by:  Rohaan Durnil  Subjective/Objective Assessment:   CM following for progression and d/c planning.     Action/Plan:   10/17/14 Met with pt re d/c plans. Pt given options re [place] services, [place] selected.   Anticipated DC Date:  10/17/2014   Anticipated DC Plan:  [place]         [facility] Choice  HOME HEALTH   Choice offered to / List presented to:  C-1 Patient        [place] arranged  HH-1 RN  [place]   Status of service:  Completed, signed off Medicare Important Message given?  YES (If response is "NO", the following Medicare IM given date fields will be blank) Date Medicare IM given:  10/17/2014 Medicare IM given by:  Makaveli Hoard Date Additional Medicare IM given:   Additional Medicare IM given by:    Discharge Disposition:  [place]  Per UR Regulation:    If discussed at Long Length of Stay Meetings, dates discussed:    Comments:

## 2014-10-17 NOTE — Progress Notes (Signed)
Discharge education completed by RN. Pt and  friend received a copy of discharge paperwork and confirm understanding of follow up appointments and discharge medications. Both deny any questions at this time. IV removed, site is within normal limits. Pt will discharge from the unit via wheelchair.

## 2014-10-17 NOTE — Progress Notes (Signed)
Physical Therapy Treatment Patient Details Name: Blake [name] MRN: [id number] DOB: [birth year] Today's Date: 10/17/2014    History of Present Illness Pt admitted with confusion and unsteady gait, dehydration. PMH:  heavy alcohol use, COPD with tobacco use, HTN, pulmonary nodule, HLD.     PT Comments    Pt progressing towards physical therapy goals. Was able to negotiate 5 steps this session with no physical assist. Close guard continues to be appropriate for mobility, as pt is impulsive at times and has decreased safety awareness. Pt was educated on the benefits of using the RW at home.   Follow Up Recommendations  Home health PT;Supervision/Assistance - 24 hour     Equipment Recommendations  None recommended by PT    Recommendations for Other Services       Precautions / Restrictions Precautions Precautions: Fall Restrictions Weight Bearing Restrictions: No    Mobility  Bed Mobility Overal bed mobility: Modified Independent Bed Mobility: Supine to Sit;Sit to Supine           General bed mobility comments: Pt was able to transition to/from EOB with no assistance. Moderate use of bed rails to get up due to back pain. Pt was instructed in log roll technique to minimize stress on back.   Transfers Overall transfer level: Needs assistance Equipment used: Rolling walker (2 wheeled) Transfers: Sit to/from Stand Sit to Stand: Supervision         General transfer comment: Pt was able to power-up to full standing without assistance. No LOB noted.   Ambulation/Gait Ambulation/Gait assistance: Min guard;Supervision Ambulation Distance (Feet): 175 Feet Assistive device: Rolling walker (2 wheeled) Gait Pattern/deviations: Step-through pattern;Decreased stride length;Trunk flexed Gait velocity: decreased Gait velocity interpretation: Below normal speed for age/gender General Gait Details: Pt was able to ambulate well with the RW. Supervision was appropriate at times. No LOB  or unsteadiness noted.   Stairs Stairs: Yes Stairs assistance: Min guard Stair Management: Two rails;Forwards Number of Stairs: 5 General stair comments: Pt was able to negotiate 5 steps with close guard for safety, however no physical assist was required. Pt wheezing and SOB after practicing the stairs.   Wheelchair Mobility    Modified Rankin (Stroke Patients Only)       Balance Overall balance assessment: History of Falls                                  Cognition Arousal/Alertness: Awake/alert Behavior During Therapy: WFL for tasks assessed/performed;Impulsive Overall Cognitive Status: No family/caregiver present to determine baseline cognitive functioning Area of Impairment: Attention;Safety/judgement;Awareness;Memory   Current Attention Level: Selective     Safety/Judgement: Decreased awareness of safety;Decreased awareness of deficits Awareness: Emergent        Exercises      General Comments        Pertinent Vitals/Pain Pain Assessment: No/denies pain    Home Living                      Prior Function            PT Goals (current goals can now be found in the care plan section) Acute Rehab PT Goals Patient Stated Goal: did not state, plans to return home PT Goal Formulation: With patient Time For Goal Achievement: 10/30/14 Potential to Achieve Goals: Good Progress towards PT goals: Progressing toward goals    Frequency  Min 3X/week    PT Plan Current plan  remains appropriate    Co-evaluation             End of Session Equipment Utilized During Treatment: Gait belt Activity Tolerance: Patient tolerated treatment well Patient left: in bed;with call bell/phone within reach;with bed alarm set     Time: 1975-8832 PT Time Calculation (min) (ACUTE ONLY): 21 min  Charges:  $Gait Training: 8-22 mins                    G Codes:      Rolinda Roan 11-12-2014, 1:25 PM   Rolinda Roan, PT, DPT Acute  Rehabilitation Services Pager: [phone]

## 2014-10-17 NOTE — Progress Notes (Signed)
Occupational Therapy Treatment Patient Details Name: Blake [name] MRN: [id number] DOB: 1941-08-04 Today's Date: 10/17/2014    History of present illness Pt admitted with confusion and unsteady gait, dehydration. PMH:  heavy alcohol use, COPD with tobacco use, HTN, pulmonary nodule, HLD.    OT comments  Pt with improved steadiness and activity tolerance in standing.  Continues to have poor memory and awareness of safety and deficits. Instructed pt to use his RW at home.    Follow Up Recommendations  Home health OT    Equipment Recommendations       Recommendations for Other Services      Precautions / Restrictions Precautions Precautions: Fall Restrictions Weight Bearing Restrictions: No       Mobility Bed Mobility Overal bed mobility: Modified Independent                Transfers   Equipment used: Rolling walker (2 wheeled) Transfers: Sit to/from Stand Sit to Stand: Min guard              Balance                                   ADL Overall ADL's : Needs assistance/impaired     Grooming: Wash/dry hands;Wash/dry face;Oral care;Brushing hair;Min guard;Standing (shaving) Grooming Details (indicate cue type and reason): stood x 15 minutes at sink with 2 seated rest breaks                 Toilet Transfer: Min guard;Ambulation;RW Toilet Transfer Details (indicate cue type and reason): stood at toilet to urinate Toileting- Water quality scientist and Hygiene: Min guard;Sit to/from stand       Functional mobility during ADLs: Min guard;Rolling walker General ADL Comments: Pt primarily limited by poor balance and safety awareness.      Vision                     Perception     Praxis      Cognition   Behavior During Therapy: [facility] for tasks assessed/performed;Impulsive Overall Cognitive Status: No family/caregiver present to determine baseline cognitive functioning Area of Impairment:  Attention;Safety/judgement;Awareness;Memory   Current Attention Level: Selective      Safety/Judgement: Decreased awareness of safety;Decreased awareness of deficits          Extremity/Trunk Assessment               Exercises     Shoulder Instructions       General Comments      Pertinent Vitals/ Pain       Pain Assessment: No/denies pain  Home Living                                          Prior Functioning/Environment              Frequency Min 2X/week     Progress Toward Goals  OT Goals(current goals can now be found in the care plan section)  Progress towards OT goals: Progressing toward goals  Acute Rehab OT Goals Patient Stated Goal: did not state, plans to return home  Plan Discharge plan remains appropriate    Co-evaluation                 End of Session Equipment Utilized During Treatment: Rolling walker;Gait belt  Activity Tolerance Patient tolerated treatment well   Patient Left in bed;with call bell/phone within reach;with bed alarm set   Nurse Communication          Time: [phone] OT Time Calculation (min): 46 min  Charges: OT General Charges $OT Visit: 1 Procedure OT Treatments $Self Care/Home Management : 38-52 mins  Malka So 10/17/2014, 11:41 AM  [phone]

## 2014-10-17 NOTE — Discharge Summary (Signed)
Physician Discharge Summary  Blake [name] CWC:[id number] DOB: 06/14/1941 DOA: 10/14/2014  PCP: Noralee Space, MD  Admit date: 10/14/2014 Discharge date: 10/17/2014  Time spent: >35 minutes  Recommendations for Outpatient Follow-up:  F/u with PCP in 1 week   Discharge Diagnoses:  Principal Problem:   Acute encephalopathy Active Problems:   Type 2 diabetes mellitus   Dyslipidemia   CIGARETTE SMOKER   Essential hypertension   COPD (chronic obstructive pulmonary disease)   GERD   ETOH abuse   Cerebrovascular small vessel disease   Unsteady gait   Alcohol withdrawal delirium   Discharge Condition: stable   Diet recommendation: low sodium   Filed Weights   10/15/14 0500 10/16/14 0500 10/16/14 2100  Weight: 69.4 kg (153 lb) 71 kg (156 lb 8.4 oz) 69.6 kg (153 lb 7 oz)    History of present illness:  74 y.o. male with history of heavy alcohol abuse, tobacco abuse, COPD, essential hypertension, HLD, GERD, pulmonary nodule,? DM (patient denies but listed) presented to ED with confusion and unsteady gait. Patient unable to provide a coherent history secondary to confusion.  -He was admitted to step down unit for acute encephalopathy.  Hospital Course:  1.Acute encephalopathy thought possible due to Alcohol withdrawal, alcohol related seizures with postictal state with possible underlying dementia.  -encephalopathy resolved, mental status at baseline; neuro exam no focal; EEG: unremarkable; MRI: no acute findings; d/w patient, to cut down on ETOH (no s/s of withdrawals) 2. Dehydration: Improved with IV fluids. Encouraged oral intake. 3. lactic acidosis: Possibly related to seizures, alcohol abuse. Improved after IV fluid hydration. 4. Essential hypertension: currently is stable on losartan, BB 5. COPD: Stable, cont bronchodilators prn  6. Leukocytosis: Likely stress response. Resolved. No clinical focus of sepsis.   Procedures:  MRI (i.e. Studies not automatically included,  echos, thoracentesis, etc; not x-rays)  Consultations:  Neurology   Discharge Exam: Filed Vitals:   10/17/14 0812  BP:   Pulse: 81  Temp:   Resp:     General: alert Cardiovascular: s1,s2 rrr Respiratory: CTA BL  Discharge Instructions  Discharge Instructions    Diet - low sodium heart healthy    Complete by:  As directed      Discharge instructions    Complete by:  As directed   Please follow up with Primary care doctor in 1 week     Increase activity slowly    Complete by:  As directed             Medication List    STOP taking these medications        diclofenac 75 MG EC tablet  Commonly known as:  VOLTAREN     ramipril 5 MG capsule  Commonly known as:  ALTACE     temazepam 30 MG capsule  Commonly known as:  RESTORIL      TAKE these medications        aspirin EC 81 MG tablet  Take 1 tablet (81 mg total) by mouth daily.     atenolol 50 MG tablet  Commonly known as:  TENORMIN  Take 1 tablet (50 mg total) by mouth daily.     folic acid 1 MG tablet  Commonly known as:  FOLVITE  TAKE 1 TABLET DAILY     gemfibrozil 600 MG tablet  Commonly known as:  LOPID  Take 600 mg by mouth 2 (two) times daily.     losartan 50 MG tablet  Commonly known as:  COZAAR  Take  50 mg by mouth daily.     oxyCODONE 5 MG immediate release tablet  Commonly known as:  Oxy IR/ROXICODONE  Take 1 tablet (5 mg total) by mouth 3 (three) times daily as needed. NOT TO EXCEED 3 PER DAY     thiamine 100 MG tablet  Take 1 tablet (100 mg total) by mouth daily.     VIAGRA 100 MG tablet  Generic drug:  sildenafil  TAKE 1 TABLET AS NEEDED FORERECTILE DYSFUNCTION       No Known Allergies     Follow-up Information    Follow up with NADEL,SCOTT M, MD In 1 week.   Specialty:  Pulmonary Disease   Contact information:   [place] [address] [phone]        The results of significant diagnostics from this hospitalization (including imaging, microbiology,  ancillary and laboratory) are listed below for reference.    Significant Diagnostic Studies: Ct Head Wo Contrast  10/14/2014   CLINICAL DATA:  Altered mental status changes starting today.  EXAM: CT HEAD WITHOUT CONTRAST  TECHNIQUE: Contiguous axial images were obtained from the base of the skull through the vertex without intravenous contrast.  COMPARISON:  05/08/2013  FINDINGS: There is no evidence for acute hemorrhage, hydrocephalus, mass lesion, or abnormal extra-axial fluid collection. No definite CT evidence for acute infarction. Diffuse loss of parenchymal volume is consistent with atrophy. Patchy low attenuation in the deep hemispheric and periventricular white matter is nonspecific, but likely reflects chronic microvascular ischemic demyelination. The visualized paranasal sinuses and mastoid air cells are clear.  IMPRESSION: Stable exam. Atrophy with chronic small vessel white matter ischemic demyelination. No acute findings.   Electronically Signed   By: Misty Stanley M.D.   On: 10/14/2014 12:55   Mr [name] Wo Contrast  10/15/2014   CLINICAL DATA:  74 year old male with altered mental status, acute encephalopathy. ETOH withdrawal. Initial encounter.  EXAM: MRI HEAD WITHOUT CONTRAST  MRA HEAD WITHOUT CONTRAST  TECHNIQUE: Multiplanar, multiecho pulse sequences of the brain and surrounding structures were obtained without intravenous contrast. Angiographic images of the head were obtained using MRA technique without contrast.  COMPARISON:  Head CT without contrast 10/14/2014. Cervical spine MRI 05/21/2013.  FINDINGS: MRI HEAD FINDINGS  Study is intermittently degraded by motion artifact despite repeated imaging attempts.  No restricted diffusion to suggest acute infarction. No midline shift, mass effect, evidence of mass lesion, ventriculomegaly, extra-axial collection or acute intracranial hemorrhage. Cervicomedullary junction and pituitary are within normal limits. Major intracranial vascular flow  voids are preserved. Mild to moderate for age scattered mostly periventricular cerebral white matter T2 and FLAIR hyperintensity. No cortical encephalomalacia. No cortical encephalomalacia. Deep gray matter nuclei, brainstem and cerebellum within normal limits for age.  Visible internal auditory structures appear normal. Visualized paranasal sinuses and mastoids are clear. Postoperative changes to the globes. Visualized scalp soft tissues are within normal limits. Normal bone marrow signal.  Degenerative changes in the visualized cervical spine.  MRA HEAD FINDINGS  Antegrade flow in the posterior circulation. Dominant appearing distal right vertebral artery. Normal right PICA. Dominant appearing left AICA. Patent vertebrobasilar junction. No basilar stenosis. SCA and left PCA origins are within normal limits. Fetal type right PCA origin. Motion artifact at the level of the PCAs. Bilateral PCA branches are within normal limits allowing for motion.  Antegrade flow in both ICA siphons No siphon stenosis. Ophthalmic and right posterior communicating artery origins are within normal limits. Left posterior communicating artery diminutive or absent.  Motion artifact at the carotid termini which are patent. MCA and ACA origins are patent. Visualized bilateral MCA and ACA branches are within normal limits allowing for motion.  IMPRESSION: 1. No acute intracranial abnormality. Mild to moderate nonspecific white matter signal changes. 2. Negative intracranial MRA allowing for motion artifact.   Electronically Signed   By: Lars Pinks M.D.   On: 10/15/2014 16:41   Mr Brain Wo Contrast  10/15/2014   CLINICAL DATA:  74 year old male with altered mental status, acute encephalopathy. ETOH withdrawal. Initial encounter.  EXAM: MRI HEAD WITHOUT CONTRAST  MRA HEAD WITHOUT CONTRAST  TECHNIQUE: Multiplanar, multiecho pulse sequences of the brain and surrounding structures were obtained without intravenous contrast. Angiographic images  of the head were obtained using MRA technique without contrast.  COMPARISON:  Head CT without contrast 10/14/2014. Cervical spine MRI 05/21/2013.  FINDINGS: MRI HEAD FINDINGS  Study is intermittently degraded by motion artifact despite repeated imaging attempts.  No restricted diffusion to suggest acute infarction. No midline shift, mass effect, evidence of mass lesion, ventriculomegaly, extra-axial collection or acute intracranial hemorrhage. Cervicomedullary junction and pituitary are within normal limits. Major intracranial vascular flow voids are preserved. Mild to moderate for age scattered mostly periventricular cerebral white matter T2 and FLAIR hyperintensity. No cortical encephalomalacia. No cortical encephalomalacia. Deep gray matter nuclei, brainstem and cerebellum within normal limits for age.  Visible internal auditory structures appear normal. Visualized paranasal sinuses and mastoids are clear. Postoperative changes to the globes. Visualized scalp soft tissues are within normal limits. Normal bone marrow signal.  Degenerative changes in the visualized cervical spine.  MRA HEAD FINDINGS  Antegrade flow in the posterior circulation. Dominant appearing distal right vertebral artery. Normal right PICA. Dominant appearing left AICA. Patent vertebrobasilar junction. No basilar stenosis. SCA and left PCA origins are within normal limits. Fetal type right PCA origin. Motion artifact at the level of the PCAs. Bilateral PCA branches are within normal limits allowing for motion.  Antegrade flow in both ICA siphons No siphon stenosis. Ophthalmic and right posterior communicating artery origins are within normal limits. Left posterior communicating artery diminutive or absent.  Motion artifact at the carotid termini which are patent. MCA and ACA origins are patent. Visualized bilateral MCA and ACA branches are within normal limits allowing for motion.  IMPRESSION: 1. No acute intracranial abnormality. Mild to  moderate nonspecific white matter signal changes. 2. Negative intracranial MRA allowing for motion artifact.   Electronically Signed   By: Lars Pinks M.D.   On: 10/15/2014 16:41   Dg Chest Portable 1 View  10/14/2014   CLINICAL DATA:  74 year old hypertensive diabetic alcohol male with COPD and history of smoking. Vomiting. Initial encounter.  EXAM: PORTABLE CHEST - 1 VIEW  COMPARISON:  07/04/2012 chest x-ray.  02/04/2009 PET-CT.  FINDINGS: Multiple remote bilateral rib fractures.  Skin fold right lung apex without evidence gross pneumothorax.  No evidence of pulmonary edema or segmental consolidation.  PET CT detected parenchymal changes not appreciated on current exam. This can be assessed on followup two view exam with cardiac leads removed when patient is able.  Calcified aorta.  Heart size within normal limits.  : No acute abnormality.  Please see above discussion.   Electronically Signed   By: Chauncey Cruel M.D.   On: 10/14/2014 16:20    Microbiology: Recent Results (from the past 240 hour(s))  MRSA PCR Screening     Status: None   Collection Time: 10/14/14  6:46 PM  Result Value Ref Range Status  MRSA by PCR NEGATIVE NEGATIVE Final    Comment:        The GeneXpert MRSA Assay (FDA approved for NASAL specimens only), is one component of a comprehensive MRSA colonization surveillance program. It is not intended to diagnose MRSA infection nor to guide or monitor treatment for MRSA infections.      Labs: Basic Metabolic Panel:  Recent Labs Lab 10/14/14 1213 10/14/14 2116 10/15/14 0317 10/16/14 0338  NA 137 131* 133* 137  K 4.8 4.3 4.0 3.8  CL 102 97 101 105  CO2 19 27 23 28   GLUCOSE 185* 96 96 113*  BUN 11 9 9 9   CREATININE 1.10 1.11 0.98 1.06  CALCIUM 9.6 8.9 8.8 8.8  MG 2.2  --   --   --    Liver Function Tests:  Recent Labs Lab 10/14/14 1213 10/15/14 0317  AST 45* 35  ALT 20 16  ALKPHOS 71 49  BILITOT 1.2 1.6*  PROT 6.8 5.6*  ALBUMIN 4.1 3.5    Recent  Labs Lab 10/14/14 1213  LIPASE 30   No results for input(s): AMMONIA in the last 168 hours. CBC:  Recent Labs Lab 10/14/14 1213 10/15/14 0317 10/16/14 0338  WBC 18.8* 10.3 8.0  NEUTROABS 17.3*  --   --   HGB 18.0* 13.9 13.5  HCT 51.2 39.8 39.0  MCV 103.6* 102.3* 100.3*  PLT 144* 133* 111*   Cardiac Enzymes: No results for input(s): CKTOTAL, CKMB, CKMBINDEX, TROPONINI in the last 168 hours. BNP: BNP (last 3 results) No results for input(s): PROBNP in the last 8760 hours. CBG:  Recent Labs Lab 10/16/14 0811 10/16/14 1222 10/16/14 1614 10/16/14 2207 10/17/14 0738  GLUCAP 102* 97 89 133* 106*       Signed:  Ayaat Jansma N  [facility] 10/17/2014, 8:52 AM

## 2014-11-25 ENCOUNTER — Other Ambulatory Visit: Payer: Self-pay | Admitting: Pulmonary Disease

## 2015-01-12 ENCOUNTER — Other Ambulatory Visit: Payer: Self-pay | Admitting: Pulmonary Disease

## 2015-07-02 ENCOUNTER — Encounter: Payer: Self-pay | Admitting: Internal Medicine

## 2015-10-13 DIAGNOSIS — E782 Mixed hyperlipidemia: Secondary | ICD-10-CM | POA: Diagnosis not present

## 2015-10-13 DIAGNOSIS — I10 Essential (primary) hypertension: Secondary | ICD-10-CM | POA: Diagnosis not present

## 2015-10-13 DIAGNOSIS — M539 Dorsopathy, unspecified: Secondary | ICD-10-CM | POA: Diagnosis not present

## 2015-10-13 DIAGNOSIS — M545 Low back pain: Secondary | ICD-10-CM | POA: Diagnosis not present

## 2015-10-13 DIAGNOSIS — Z79899 Other long term (current) drug therapy: Secondary | ICD-10-CM | POA: Diagnosis not present

## 2015-11-17 DIAGNOSIS — I10 Essential (primary) hypertension: Secondary | ICD-10-CM | POA: Diagnosis not present

## 2015-11-17 DIAGNOSIS — Z79899 Other long term (current) drug therapy: Secondary | ICD-10-CM | POA: Diagnosis not present

## 2015-11-17 DIAGNOSIS — G4700 Insomnia, unspecified: Secondary | ICD-10-CM | POA: Diagnosis not present

## 2015-11-17 DIAGNOSIS — E7800 Pure hypercholesterolemia, unspecified: Secondary | ICD-10-CM | POA: Diagnosis not present

## 2015-11-17 DIAGNOSIS — M539 Dorsopathy, unspecified: Secondary | ICD-10-CM | POA: Diagnosis not present

## 2015-12-15 DIAGNOSIS — M545 Low back pain: Secondary | ICD-10-CM | POA: Diagnosis not present

## 2015-12-15 DIAGNOSIS — E7800 Pure hypercholesterolemia, unspecified: Secondary | ICD-10-CM | POA: Diagnosis not present

## 2015-12-15 DIAGNOSIS — Z79899 Other long term (current) drug therapy: Secondary | ICD-10-CM | POA: Diagnosis not present

## 2015-12-15 DIAGNOSIS — M25519 Pain in unspecified shoulder: Secondary | ICD-10-CM | POA: Diagnosis not present

## 2015-12-15 DIAGNOSIS — I10 Essential (primary) hypertension: Secondary | ICD-10-CM | POA: Diagnosis not present

## 2016-01-14 DIAGNOSIS — R5383 Other fatigue: Secondary | ICD-10-CM | POA: Diagnosis not present

## 2016-01-14 DIAGNOSIS — R0602 Shortness of breath: Secondary | ICD-10-CM | POA: Diagnosis not present

## 2016-01-14 DIAGNOSIS — R300 Dysuria: Secondary | ICD-10-CM | POA: Diagnosis not present

## 2016-01-14 DIAGNOSIS — D539 Nutritional anemia, unspecified: Secondary | ICD-10-CM | POA: Diagnosis not present

## 2016-01-14 DIAGNOSIS — Z87891 Personal history of nicotine dependence: Secondary | ICD-10-CM | POA: Diagnosis not present

## 2016-01-14 DIAGNOSIS — I10 Essential (primary) hypertension: Secondary | ICD-10-CM | POA: Diagnosis not present

## 2016-01-14 DIAGNOSIS — E7800 Pure hypercholesterolemia, unspecified: Secondary | ICD-10-CM | POA: Diagnosis not present

## 2016-01-14 DIAGNOSIS — E559 Vitamin D deficiency, unspecified: Secondary | ICD-10-CM | POA: Diagnosis not present

## 2016-01-14 DIAGNOSIS — Z79899 Other long term (current) drug therapy: Secondary | ICD-10-CM | POA: Diagnosis not present

## 2016-01-14 DIAGNOSIS — E291 Testicular hypofunction: Secondary | ICD-10-CM | POA: Diagnosis not present

## 2016-01-14 DIAGNOSIS — M129 Arthropathy, unspecified: Secondary | ICD-10-CM | POA: Diagnosis not present

## 2016-02-09 DIAGNOSIS — I10 Essential (primary) hypertension: Secondary | ICD-10-CM | POA: Diagnosis not present

## 2016-02-09 DIAGNOSIS — M539 Dorsopathy, unspecified: Secondary | ICD-10-CM | POA: Diagnosis not present

## 2016-02-09 DIAGNOSIS — E7800 Pure hypercholesterolemia, unspecified: Secondary | ICD-10-CM | POA: Diagnosis not present

## 2016-02-09 DIAGNOSIS — Z79899 Other long term (current) drug therapy: Secondary | ICD-10-CM | POA: Diagnosis not present

## 2016-02-09 DIAGNOSIS — G4700 Insomnia, unspecified: Secondary | ICD-10-CM | POA: Diagnosis not present

## 2016-04-09 DIAGNOSIS — I10 Essential (primary) hypertension: Secondary | ICD-10-CM | POA: Diagnosis not present

## 2016-04-09 DIAGNOSIS — M542 Cervicalgia: Secondary | ICD-10-CM | POA: Diagnosis not present

## 2016-04-09 DIAGNOSIS — G4700 Insomnia, unspecified: Secondary | ICD-10-CM | POA: Diagnosis not present

## 2016-04-09 DIAGNOSIS — Z720 Tobacco use: Secondary | ICD-10-CM | POA: Diagnosis not present

## 2016-04-09 DIAGNOSIS — D649 Anemia, unspecified: Secondary | ICD-10-CM | POA: Diagnosis not present

## 2016-04-09 DIAGNOSIS — M25519 Pain in unspecified shoulder: Secondary | ICD-10-CM | POA: Diagnosis not present

## 2016-04-09 DIAGNOSIS — Z0000 Encounter for general adult medical examination without abnormal findings: Secondary | ICD-10-CM | POA: Diagnosis not present

## 2016-05-05 DIAGNOSIS — M542 Cervicalgia: Secondary | ICD-10-CM | POA: Diagnosis not present

## 2016-05-05 DIAGNOSIS — M25519 Pain in unspecified shoulder: Secondary | ICD-10-CM | POA: Diagnosis not present

## 2016-05-19 ENCOUNTER — Encounter: Payer: Self-pay | Admitting: Gastroenterology

## 2016-08-27 ENCOUNTER — Other Ambulatory Visit (INDEPENDENT_AMBULATORY_CARE_PROVIDER_SITE_OTHER): Payer: Medicare Other

## 2016-08-27 ENCOUNTER — Ambulatory Visit (INDEPENDENT_AMBULATORY_CARE_PROVIDER_SITE_OTHER): Payer: Medicare Other | Admitting: Nurse Practitioner

## 2016-08-27 ENCOUNTER — Encounter: Payer: Self-pay | Admitting: Nurse Practitioner

## 2016-08-27 VITALS — BP 120/68 | HR 42 | Temp 97.7°F | Ht 71.0 in | Wt 151.0 lb

## 2016-08-27 DIAGNOSIS — D126 Benign neoplasm of colon, unspecified: Secondary | ICD-10-CM

## 2016-08-27 DIAGNOSIS — I10 Essential (primary) hypertension: Secondary | ICD-10-CM | POA: Diagnosis not present

## 2016-08-27 DIAGNOSIS — R972 Elevated prostate specific antigen [PSA]: Secondary | ICD-10-CM | POA: Diagnosis not present

## 2016-08-27 DIAGNOSIS — M542 Cervicalgia: Secondary | ICD-10-CM

## 2016-08-27 DIAGNOSIS — E785 Hyperlipidemia, unspecified: Secondary | ICD-10-CM

## 2016-08-27 DIAGNOSIS — G4700 Insomnia, unspecified: Secondary | ICD-10-CM

## 2016-08-27 DIAGNOSIS — F1010 Alcohol abuse, uncomplicated: Secondary | ICD-10-CM

## 2016-08-27 DIAGNOSIS — Z23 Encounter for immunization: Secondary | ICD-10-CM | POA: Diagnosis not present

## 2016-08-27 DIAGNOSIS — E118 Type 2 diabetes mellitus with unspecified complications: Secondary | ICD-10-CM

## 2016-08-27 DIAGNOSIS — M549 Dorsalgia, unspecified: Secondary | ICD-10-CM

## 2016-08-27 DIAGNOSIS — G8929 Other chronic pain: Secondary | ICD-10-CM

## 2016-08-27 LAB — COMPREHENSIVE METABOLIC PANEL
ALBUMIN: 4.2 g/dL (ref 3.5–5.2)
ALK PHOS: 73 U/L (ref 39–117)
ALT: 9 U/L (ref 0–53)
AST: 15 U/L (ref 0–37)
BUN: 11 mg/dL (ref 6–23)
CO2: 25 mEq/L (ref 19–32)
Calcium: 9.6 mg/dL (ref 8.4–10.5)
Chloride: 108 mEq/L (ref 96–112)
Creatinine, Ser: 0.86 mg/dL (ref 0.40–1.50)
GFR: 92.07 mL/min (ref >60.00)
Glucose, Bld: 86 mg/dL (ref 70–99)
POTASSIUM: 4.8 meq/L (ref 3.5–5.1)
Sodium: 142 mEq/L (ref 135–145)
TOTAL PROTEIN: 6.5 g/dL (ref 6.0–8.3)
Total Bilirubin: 0.8 mg/dL (ref 0.2–1.2)

## 2016-08-27 LAB — LIPID PANEL
CHOLESTEROL: 165 mg/dL (ref 0–200)
HDL: 52.2 mg/dL (ref >39.00)
LDL Cholesterol: 96 mg/dL (ref 0–99)
NonHDL: 112.74
TRIGLYCERIDES: 82 mg/dL (ref 0.0–149.0)
Total CHOL/HDL Ratio: 3
VLDL: 16.4 mg/dL (ref 0.0–40.0)

## 2016-08-27 LAB — MICROALBUMIN / CREATININE URINE RATIO
CREATININE, U: 148 mg/dL
MICROALB/CREAT RATIO: 0.7 mg/g (ref 0.0–30.0)
Microalb, Ur: 1.1 mg/dL (ref 0.0–1.9)

## 2016-08-27 LAB — HEMOGLOBIN A1C: Hgb A1c MFr Bld: 4.5 % — ABNORMAL LOW (ref 4.6–6.5)

## 2016-08-27 LAB — PSA: PSA: 0.38 ng/mL (ref 0.10–4.00)

## 2016-08-27 MED ORDER — TEMAZEPAM 30 MG PO CAPS: 30.0000 mg | ORAL_CAPSULE | Freq: Every evening | ORAL | 1 refills | Status: DC | PRN

## 2016-08-27 MED ORDER — ATENOLOL 50 MG PO TABS: 50.0000 mg | ORAL_TABLET | Freq: Every day | ORAL | 0 refills | Status: DC

## 2016-08-27 MED ORDER — LOSARTAN POTASSIUM 50 MG PO TABS: 50.0000 mg | ORAL_TABLET | Freq: Every day | ORAL | 0 refills | Status: DC

## 2016-08-27 MED ORDER — FOLIC ACID 1 MG PO TABS: 1.0000 mg | ORAL_TABLET | Freq: Every day | ORAL | 0 refills | Status: DC

## 2016-08-27 MED ORDER — VITAMIN D 1000 UNITS PO TABS: 1000.0000 [IU] | ORAL_TABLET | Freq: Every day | ORAL | 0 refills | Status: DC

## 2016-08-27 MED ORDER — THIAMINE HCL 100 MG PO TABS: 100.0000 mg | ORAL_TABLET | Freq: Every day | ORAL | 0 refills | Status: DC

## 2016-08-27 MED ORDER — ASPIRIN EC 81 MG PO TBEC: 81.0000 mg | DELAYED_RELEASE_TABLET | Freq: Every day | ORAL | 0 refills | Status: DC

## 2016-08-27 MED ORDER — GEMFIBROZIL 600 MG PO TABS: 600.0000 mg | ORAL_TABLET | Freq: Two times a day (BID) | ORAL | 0 refills | Status: DC

## 2016-08-27 NOTE — Progress Notes (Signed)
Subjective:    Patient ID: Blake [name], male    DOB: Jan 17, 1941, 75 y.o.   MRN: [id number]  Patient presents today for establish care (new patient) and medication refill.  HPI Lives with Friend who helps with transportation and ADLs. Previous pcp was Dr. Holley Raring with [facility] in [facility]. He was last seen 61months ago.  Neck and Shoulder pain: Ongoing for several years. Treated with oxycodone. Indication for chronic opioid: neck and back pain. Medication and dose: oxycodone 10mg  Date narcotic database last reviewed (include red flags): 08/27/16, last oxycodone prescription prescribed by Dr. Jani Gravel, #120 dispensed by [facility] in [place], [place]. Temazepam 30mg  also prescribed 04/01/2016, #87 by Dr. Jani Gravel.  COPD: stable, no inhalers used, current everyday smoker. Denies any acute respiratory symptom  HTN: controlled with losartan and atenolol.   Immunizations: (TDAP, Hep C screen, Pneumovax, Influenza, zoster)  Health Maintenance  Topic Date Due  . Eye exam for diabetics  05/12/1951  . Shingles Vaccine  05/11/2001  . Pneumonia vaccines (2 of 2 - PCV13) 08/11/2011  . Hemoglobin A1C  04/15/2015  . Flu Shot  04/27/2016  . Colon Cancer Screening  07/26/2016  . Complete foot exam   08/27/2017  . Tetanus Vaccine  10/14/2024   Diet:none Weight:  Wt Readings from Last 3 Encounters:  08/27/16 151 lb (68.5 kg)  10/16/14 153 lb 7 oz (69.6 kg)  03/21/13 162 lb 3.2 oz (73.6 kg)   Exercise:none Fall Risk:denies No flowsheet data found. Home Safety:home with frien Depression/Suicide:denies No flowsheet data found. No flowsheet data found. Colonoscopy (every 5-68yrs, >50-26yrs):needed PSA (yearly, >84yrs):needed Vision:needed Dental:needed Advanced Directive: Advanced Directives 10/17/2014  Does Patient Have a Medical Advance Directive? No  Type of Advance Directive -  Copy of [place] in Chart? -  Would patient like information  on creating a medical advance directive? No - patient declined information  Pre-existing out of facility DNR order (yellow form or pink MOST form) -    Medications and allergies reviewed with patient and updated if appropriate.  Patient Active Problem List   Diagnosis Date Noted  . Acute encephalopathy 10/14/2014  . Unsteady gait 10/14/2014  . Alcohol withdrawal delirium ([place]) 10/14/2014  . Lipoma of neck 12/22/2012  . Cerebral atrophy 09/28/2012  . Cerebrovascular small vessel disease 09/28/2012  . Left humeral fracture 07/04/2012  . Confusion 07/04/2012  . ETOH abuse   . ELEVATED PROSTATE SPECIFIC ANTIGEN 09/30/2010  . COPD (chronic obstructive pulmonary disease) ([place]) 01/28/2009  . PULMONARY NODULE 01/28/2009  . CIGARETTE SMOKER 02/07/2008  . INSOMNIA, CHRONIC 10/15/2007  . COLONIC POLYPS 10/10/2007  . Type 2 diabetes mellitus ([place]) 10/10/2007  . Dyslipidemia 10/10/2007  . Essential hypertension 10/10/2007  . GERD 10/10/2007  . DIVERTICULOSIS OF COLON 10/10/2007    Current Outpatient Prescriptions on File Prior to Visit  Medication Sig Dispense Refill  . oxyCODONE (OXY IR/ROXICODONE) 5 MG immediate release tablet Take 1 tablet (5 mg total) by mouth 3 (three) times daily as needed. NOT TO EXCEED 3 PER DAY (Patient taking differently: Take 5 mg by mouth 3 (three) times daily as needed for moderate pain. NOT TO EXCEED 3 PER DAY) 90 tablet 0  . [DISCONTINUED] tadalafil (CIALIS) 20 MG tablet Take 20 mg by mouth daily as needed. For ED     No current facility-administered medications on file prior to visit.     Past Medical History:  Diagnosis Date  . Chronic insomnia   . Cigarette  smoker   . COPD (chronic obstructive pulmonary disease) ([place])   . Diabetes mellitus    no per pt, no meds taken  . Diverticulosis of colon   . Elevated prostate specific antigen (PSA)   . ETOH abuse   . GERD (gastroesophageal reflux disease)   . Hx of colonic polyps   . Hyperlipidemia   .  Hypertension   . Pulmonary nodule   . Shortness of breath   . Stroke [facility])     Past Surgical History:  Procedure Laterality Date  . COLONOSCOPY    . needle biopsy RUL nodule  01/2009   benign  . TONSILLECTOMY  1946/01/01  . VASECTOMY      Social History   Social History  . Marital status: Widowed    Spouse name: Mary(deceased 01/02/1999)  . Number of children: 1  . Years of education: N/A   Occupational History  . retired     from [employer] , Pharmacist, community   Social History Main Topics  . Smoking status: Current Every Day Smoker    Packs/day: 1.00    Years: 55.00    Types: Cigarettes  . Smokeless tobacco: Never Used  . Alcohol use 14.4 oz/week    24 Cans of beer per week     Comment: 12-24 beer daily  . Drug use: No  . Sexual activity: No   Other Topics Concern  . None   Social History Narrative   1 son that died in a MVA    Family History  Problem Relation Age of Onset  . Diabetes Father   . Lung cancer Brother   . Colon cancer Neg Hx   . Esophageal cancer Neg Hx   . Stomach cancer Neg Hx         Review of Systems  Constitutional: Negative for fever, malaise/fatigue and weight loss.  HENT: Negative for congestion and sore throat.   Eyes:       Negative for visual changes  Respiratory: Negative for cough and shortness of breath.   Cardiovascular: Negative for chest pain, palpitations and leg swelling.  Gastrointestinal: Negative for blood in stool, constipation, diarrhea and heartburn.  Genitourinary: Negative for dysuria, frequency and urgency.  Musculoskeletal: Positive for back pain and neck pain. Negative for falls, joint pain and myalgias.  Skin: Negative for rash.  Neurological: Negative for dizziness, sensory change and headaches.  Endo/Heme/Allergies: Does not bruise/bleed easily.  Psychiatric/Behavioral: Negative for depression, substance abuse and suicidal ideas. The patient is not nervous/anxious.     Objective:   Vitals:   08/27/16 0923  BP:  120/68  Pulse: (!) 42  Temp: 97.7 F (36.5 C)    Body mass index is 21.06 kg/m.   Physical Examination:  Physical Exam  Constitutional: He is oriented to person, place, and time and well-developed, well-nourished, and in no distress. No distress.  HENT:  Right Ear: External ear normal.  Left Ear: External ear normal.  Nose: Nose normal.  Mouth/Throat: Oropharynx is clear and moist. No oropharyngeal exudate.  Eyes: Conjunctivae and EOM are normal. Pupils are equal, round, and reactive to light. No scleral icterus.  Neck: Normal range of motion. Neck supple. No thyromegaly present.  Cardiovascular: Regular rhythm, normal heart sounds and intact distal pulses.   Pulmonary/Chest: Effort normal and breath sounds normal. He exhibits no tenderness.  Abdominal: Soft. Bowel sounds are normal. He exhibits no distension. There is no tenderness.  Musculoskeletal: Normal range of motion. He exhibits no edema or tenderness.  Lymphadenopathy:  He has no cervical adenopathy.  Neurological: He is alert and oriented to person, place, and time. He has normal reflexes. He exhibits normal muscle tone. Gait normal. Coordination normal.  Skin: Skin is warm and dry.  Psychiatric: Affect and judgment normal.  Vitals reviewed.   ASSESSMENT and PLAN:  Cayd was seen today for establish care.  Diagnoses and all orders for this visit:  Type 2 diabetes mellitus with complication, without long-term current use of insulin (HCC) -     Comprehensive metabolic panel; Future -     Hemoglobin A1c; Future -     Microalbumin / creatinine urine ratio; Future -     Ambulatory referral to Ophthalmology  Essential hypertension -     Comprehensive metabolic panel; Future -     losartan (COZAAR) 50 MG tablet; Take 1 tablet (50 mg total) by mouth daily. -     atenolol (TENORMIN) 50 MG tablet; Take 1 tablet (50 mg total) by mouth daily. -     aspirin EC 81 MG tablet; Take 1 tablet (81 mg total) by mouth  daily.  Dyslipidemia -     Lipid panel; Future -     gemfibrozil (LOPID) 600 MG tablet; Take 1 tablet (600 mg total) by mouth 2 (two) times daily.  ELEVATED PROSTATE SPECIFIC ANTIGEN -     PSA; Future  Chronic neck and back pain -     Ambulatory referral to Pain Clinic  Benign neoplasm of colon, unspecified part of colon -     Ambulatory referral to Gastroenterology  ETOH abuse -     thiamine 100 MG tablet; Take 1 tablet (100 mg total) by mouth daily. -     folic acid (FOLVITE) 1 MG tablet; Take 1 tablet (1 mg total) by mouth daily. -     cholecalciferol (VITAMIN D) 1000 units tablet; Take 1 tablet (1,000 Units total) by mouth daily.  Need for prophylactic vaccination and inoculation against influenza -     Flu vaccine HIGH DOSE PF  Need for prophylactic vaccination against Streptococcus pneumoniae (pneumococcus) -     Pneumococcal polysaccharide vaccine 23-valent greater than or equal to 2yo subcutaneous/IM  INSOMNIA, CHRONIC -     temazepam (RESTORIL) 30 MG capsule; Take 1 capsule (30 mg total) by mouth at bedtime as needed for sleep.    INSOMNIA, CHRONIC Current use of restoril 30mg   Dyslipidemia Lipid panel ordered Current use of Lopid  Essential hypertension Controlled with atenolol and losartan.      Follow up: Return in about 3 months (around 11/25/2016) for DM and HTN.  Wilfred Lacy, NP

## 2016-08-27 NOTE — Assessment & Plan Note (Signed)
Controlled with atenolol and losartan.

## 2016-08-27 NOTE — Assessment & Plan Note (Signed)
Lipid panel ordered Current use of Lopid

## 2016-08-27 NOTE — Assessment & Plan Note (Signed)
Current use of restoril 30mg 

## 2016-08-27 NOTE — Progress Notes (Signed)
Pre visit review using our clinic review tool, if applicable. No additional management support is needed unless otherwise documented below in the visit note. 

## 2016-08-27 NOTE — Patient Instructions (Signed)
Sign medical release form to get records from [facility] (dr. Holley Raring).  Go to basement for lab draw. You will be called with results.

## 2016-08-27 NOTE — Progress Notes (Signed)
Normal results

## 2016-09-06 ENCOUNTER — Encounter: Payer: Self-pay | Admitting: Gastroenterology

## 2016-11-02 ENCOUNTER — Encounter: Payer: Medicare Other | Admitting: Gastroenterology

## 2016-11-17 ENCOUNTER — Telehealth: Payer: Self-pay | Admitting: Nurse Practitioner

## 2016-11-17 NOTE — Telephone Encounter (Signed)
Trying to reach pt to verify for respiratory supply from [facility]. Waiting for pt to call back to confirm.

## 2016-11-18 ENCOUNTER — Encounter: Payer: Self-pay | Admitting: Nurse Practitioner

## 2016-11-29 ENCOUNTER — Ambulatory Visit: Payer: Medicare Other | Admitting: Nurse Practitioner

## 2016-11-29 DIAGNOSIS — Z0289 Encounter for other administrative examinations: Secondary | ICD-10-CM

## 2017-01-04 DIAGNOSIS — Z13220 Encounter for screening for lipoid disorders: Secondary | ICD-10-CM | POA: Diagnosis not present

## 2017-01-04 DIAGNOSIS — M25519 Pain in unspecified shoulder: Secondary | ICD-10-CM | POA: Diagnosis not present

## 2017-01-04 DIAGNOSIS — Z131 Encounter for screening for diabetes mellitus: Secondary | ICD-10-CM | POA: Diagnosis not present

## 2017-01-04 DIAGNOSIS — I10 Essential (primary) hypertension: Secondary | ICD-10-CM | POA: Diagnosis not present

## 2017-01-04 DIAGNOSIS — G894 Chronic pain syndrome: Secondary | ICD-10-CM | POA: Diagnosis not present

## 2017-01-04 DIAGNOSIS — G4700 Insomnia, unspecified: Secondary | ICD-10-CM | POA: Diagnosis not present

## 2017-01-04 DIAGNOSIS — M47812 Spondylosis without myelopathy or radiculopathy, cervical region: Secondary | ICD-10-CM | POA: Diagnosis not present

## 2017-07-04 ENCOUNTER — Telehealth: Payer: Self-pay | Admitting: Nurse Practitioner

## 2017-07-04 NOTE — Telephone Encounter (Signed)
Tried to call pt. She did not answer and did not have a voicemail.

## 2017-07-04 NOTE — Telephone Encounter (Signed)
Received refill for respiratory med from [facility] respiratory department. (form at my desk).    Pt needs an appt per Charlott.   Please help call pt.

## 2017-07-05 NOTE — Telephone Encounter (Signed)
Tried to call pt again to offer an appt. Can not leave vm.

## 2017-07-29 NOTE — Telephone Encounter (Signed)
Tried to call pt again to offer an appt in order for [facility] to fill out respiratory refills rx. Cant leave vm.

## 2018-01-07 ENCOUNTER — Encounter (HOSPITAL_COMMUNITY): Payer: Self-pay | Admitting: Emergency Medicine

## 2018-01-07 ENCOUNTER — Emergency Department (HOSPITAL_COMMUNITY): Payer: Medicare Other

## 2018-01-07 ENCOUNTER — Inpatient Hospital Stay (HOSPITAL_COMMUNITY)
Admission: EM | Admit: 2018-01-07 | Discharge: 2018-01-09 | DRG: 897 | Disposition: A | Discharge status: Home / Self Care | Payer: Medicare Other | Attending: Internal Medicine | Admitting: Internal Medicine

## 2018-01-07 DIAGNOSIS — E860 Dehydration: Secondary | ICD-10-CM | POA: Diagnosis present

## 2018-01-07 DIAGNOSIS — R402413 Glasgow coma scale score 13-15, at hospital admission: Secondary | ICD-10-CM | POA: Diagnosis present

## 2018-01-07 DIAGNOSIS — Z8673 Personal history of transient ischemic attack (TIA), and cerebral infarction without residual deficits: Secondary | ICD-10-CM | POA: Diagnosis not present

## 2018-01-07 DIAGNOSIS — R29701 NIHSS score 1: Secondary | ICD-10-CM | POA: Diagnosis present

## 2018-01-07 DIAGNOSIS — E785 Hyperlipidemia, unspecified: Secondary | ICD-10-CM | POA: Diagnosis present

## 2018-01-07 DIAGNOSIS — R569 Unspecified convulsions: Secondary | ICD-10-CM | POA: Diagnosis not present

## 2018-01-07 DIAGNOSIS — K219 Gastro-esophageal reflux disease without esophagitis: Secondary | ICD-10-CM | POA: Diagnosis present

## 2018-01-07 DIAGNOSIS — Z8639 Personal history of other endocrine, nutritional and metabolic disease: Secondary | ICD-10-CM | POA: Diagnosis not present

## 2018-01-07 DIAGNOSIS — R410 Disorientation, unspecified: Secondary | ICD-10-CM

## 2018-01-07 DIAGNOSIS — F10921 Alcohol use, unspecified with intoxication delirium: Secondary | ICD-10-CM | POA: Insufficient documentation

## 2018-01-07 DIAGNOSIS — I440 Atrioventricular block, first degree: Secondary | ICD-10-CM | POA: Diagnosis present

## 2018-01-07 DIAGNOSIS — F5104 Psychophysiologic insomnia: Secondary | ICD-10-CM | POA: Diagnosis present

## 2018-01-07 DIAGNOSIS — Z79899 Other long term (current) drug therapy: Secondary | ICD-10-CM

## 2018-01-07 DIAGNOSIS — R001 Bradycardia, unspecified: Secondary | ICD-10-CM | POA: Diagnosis present

## 2018-01-07 DIAGNOSIS — F10231 Alcohol dependence with withdrawal delirium: Secondary | ICD-10-CM | POA: Diagnosis present

## 2018-01-07 DIAGNOSIS — F1010 Alcohol abuse, uncomplicated: Secondary | ICD-10-CM | POA: Diagnosis not present

## 2018-01-07 DIAGNOSIS — H9192 Unspecified hearing loss, left ear: Secondary | ICD-10-CM | POA: Diagnosis present

## 2018-01-07 DIAGNOSIS — Z7982 Long term (current) use of aspirin: Secondary | ICD-10-CM

## 2018-01-07 DIAGNOSIS — R2681 Unsteadiness on feet: Secondary | ICD-10-CM

## 2018-01-07 DIAGNOSIS — F17210 Nicotine dependence, cigarettes, uncomplicated: Secondary | ICD-10-CM | POA: Diagnosis present

## 2018-01-07 DIAGNOSIS — Y900 Blood alcohol level of less than 20 mg/100 ml: Secondary | ICD-10-CM | POA: Diagnosis present

## 2018-01-07 DIAGNOSIS — R251 Tremor, unspecified: Secondary | ICD-10-CM | POA: Diagnosis not present

## 2018-01-07 DIAGNOSIS — R29818 Other symptoms and signs involving the nervous system: Secondary | ICD-10-CM | POA: Diagnosis not present

## 2018-01-07 DIAGNOSIS — G319 Degenerative disease of nervous system, unspecified: Secondary | ICD-10-CM

## 2018-01-07 DIAGNOSIS — I10 Essential (primary) hypertension: Secondary | ICD-10-CM | POA: Diagnosis present

## 2018-01-07 DIAGNOSIS — R402441 Other coma, without documented Glasgow coma scale score, or with partial score reported, in the field [EMT or ambulance]: Secondary | ICD-10-CM | POA: Diagnosis not present

## 2018-01-07 DIAGNOSIS — J449 Chronic obstructive pulmonary disease, unspecified: Secondary | ICD-10-CM | POA: Diagnosis present

## 2018-01-07 LAB — CBC
HEMATOCRIT: 47.1 % (ref 39.0–52.0)
Hemoglobin: 15.9 g/dL (ref 13.0–17.0)
MCH: 34.1 pg — ABNORMAL HIGH (ref 26.0–34.0)
MCHC: 33.8 g/dL (ref 30.0–36.0)
MCV: 101.1 fL — AB (ref 78.0–100.0)
Platelets: 193 10*3/uL (ref 150–400)
RBC: 4.66 MIL/uL (ref 4.22–5.81)
RDW: 14.6 % (ref 11.5–15.5)
WBC: 10.5 10*3/uL (ref 4.0–10.5)

## 2018-01-07 LAB — MAGNESIUM: MAGNESIUM: 1.9 mg/dL (ref 1.7–2.4)

## 2018-01-07 LAB — COMPREHENSIVE METABOLIC PANEL
ALT: 11 U/L — AB (ref 17–63)
AST: 27 U/L (ref 15–41)
Albumin: 4 g/dL (ref 3.5–5.0)
Alkaline Phosphatase: 72 U/L (ref 38–126)
Anion gap: 11 (ref 5–15)
BUN: 8 mg/dL (ref 6–20)
CALCIUM: 8.9 mg/dL (ref 8.9–10.3)
CO2: 22 mmol/L (ref 22–32)
Chloride: 101 mmol/L (ref 101–111)
Creatinine, Ser: 0.85 mg/dL (ref 0.61–1.24)
Glucose, Bld: 76 mg/dL (ref 65–99)
Potassium: 3.8 mmol/L (ref 3.5–5.1)
SODIUM: 134 mmol/L — AB (ref 135–145)
Total Bilirubin: 0.9 mg/dL (ref 0.3–1.2)
Total Protein: 6.5 g/dL (ref 6.5–8.1)

## 2018-01-07 LAB — DIFFERENTIAL
Basophils Absolute: 0 10*3/uL (ref 0.0–0.1)
Basophils Relative: 0 %
Eosinophils Absolute: 0 10*3/uL (ref 0.0–0.7)
Eosinophils Relative: 0 %
LYMPHS PCT: 10 %
Lymphs Abs: 1 10*3/uL (ref 0.7–4.0)
MONO ABS: 0.6 10*3/uL (ref 0.1–1.0)
MONOS PCT: 6 %
NEUTROS ABS: 8.8 10*3/uL — AB (ref 1.7–7.7)
Neutrophils Relative %: 84 %

## 2018-01-07 LAB — I-STAT CHEM 8, ED
BUN: 9 mg/dL (ref 6–20)
CALCIUM ION: 1.11 mmol/L — AB (ref 1.15–1.40)
CHLORIDE: 101 mmol/L (ref 101–111)
Creatinine, Ser: 0.8 mg/dL (ref 0.61–1.24)
Glucose, Bld: 71 mg/dL (ref 65–99)
HEMATOCRIT: 48 % (ref 39.0–52.0)
Hemoglobin: 16.3 g/dL (ref 13.0–17.0)
POTASSIUM: 3.8 mmol/L (ref 3.5–5.1)
SODIUM: 136 mmol/L (ref 135–145)
TCO2: 25 mmol/L (ref 22–32)

## 2018-01-07 LAB — PROTIME-INR
INR: 1.01
Prothrombin Time: 13.2 seconds (ref 11.4–15.2)

## 2018-01-07 LAB — URINALYSIS, ROUTINE W REFLEX MICROSCOPIC
BACTERIA UA: NONE SEEN
Bilirubin Urine: NEGATIVE
Glucose, UA: NEGATIVE mg/dL
Ketones, ur: NEGATIVE mg/dL
Leukocytes, UA: NEGATIVE
Nitrite: NEGATIVE
PROTEIN: NEGATIVE mg/dL
SQUAMOUS EPITHELIAL / LPF: NONE SEEN
Specific Gravity, Urine: 1.014 (ref 1.005–1.030)
pH: 6 (ref 5.0–8.0)

## 2018-01-07 LAB — CBG MONITORING, ED: Glucose-Capillary: 82 mg/dL (ref 65–99)

## 2018-01-07 LAB — VITAMIN B12: Vitamin B-12: 120 pg/mL — ABNORMAL LOW (ref 180–914)

## 2018-01-07 LAB — ETHANOL: Alcohol, Ethyl (B): <10 mg/dL (ref <10)

## 2018-01-07 LAB — PHOSPHORUS: Phosphorus: 2.8 mg/dL (ref 2.5–4.6)

## 2018-01-07 LAB — RAPID URINE DRUG SCREEN, HOSP PERFORMED
Amphetamines: NOT DETECTED
BENZODIAZEPINES: NOT DETECTED
Barbiturates: NOT DETECTED
COCAINE: NOT DETECTED
OPIATES: NOT DETECTED
Tetrahydrocannabinol: NOT DETECTED

## 2018-01-07 LAB — APTT: aPTT: 29 seconds (ref 24–36)

## 2018-01-07 LAB — I-STAT TROPONIN, ED: Troponin i, poc: 0.01 ng/mL (ref 0.00–0.08)

## 2018-01-07 MED ORDER — THIAMINE HCL 100 MG/ML IJ SOLN: 100.0000 mg | Freq: Every day | INTRAMUSCULAR | Status: DC

## 2018-01-07 MED ORDER — ATENOLOL 50 MG PO TABS: 50.0000 mg | ORAL_TABLET | Freq: Every day | ORAL | Status: DC

## 2018-01-07 MED ORDER — LORAZEPAM 1 MG PO TABS: 0.0000 mg | ORAL_TABLET | Freq: Two times a day (BID) | ORAL | Status: DC

## 2018-01-07 MED ORDER — TEMAZEPAM 15 MG PO CAPS: 30.0000 mg | ORAL_CAPSULE | Freq: Every evening | ORAL | Status: DC | PRN

## 2018-01-07 MED ORDER — SODIUM CHLORIDE 0.9 % IV SOLN
INTRAVENOUS | Status: DC
  Administered 2018-01-07 – 2018-01-08 (×2): via INTRAVENOUS

## 2018-01-07 MED ORDER — FOLIC ACID 5 MG/ML IJ SOLN
1.0000 mg | Freq: Every day | INTRAMUSCULAR | Status: DC
  Administered 2018-01-08: 1 mg via INTRAVENOUS
  Filled 2018-01-07 (×2): qty 0.2

## 2018-01-07 MED ORDER — SODIUM CHLORIDE 0.9 % IV BOLUS
1000.0000 mL | Freq: Once | INTRAVENOUS | Status: AC
  Administered 2018-01-07: 1000 mL via INTRAVENOUS

## 2018-01-07 MED ORDER — ACETAMINOPHEN 325 MG PO TABS
650.0000 mg | ORAL_TABLET | Freq: Four times a day (QID) | ORAL | Status: DC | PRN
  Administered 2018-01-07: 650 mg via ORAL
  Filled 2018-01-07: qty 2

## 2018-01-07 MED ORDER — ASPIRIN EC 81 MG PO TBEC
81.0000 mg | DELAYED_RELEASE_TABLET | Freq: Every day | ORAL | Status: DC
  Administered 2018-01-08 – 2018-01-09 (×2): 81 mg via ORAL
  Filled 2018-01-07 (×2): qty 1

## 2018-01-07 MED ORDER — ACETAMINOPHEN 650 MG RE SUPP: 650.0000 mg | Freq: Four times a day (QID) | RECTAL | Status: DC | PRN

## 2018-01-07 MED ORDER — LORAZEPAM 2 MG/ML IJ SOLN: 1.0000 mg | Freq: Once | INTRAMUSCULAR | Status: DC

## 2018-01-07 MED ORDER — LEVETIRACETAM IN NACL 500 MG/100ML IV SOLN
500.0000 mg | Freq: Two times a day (BID) | INTRAVENOUS | Status: DC
  Filled 2018-01-07: qty 100

## 2018-01-07 MED ORDER — PANTOPRAZOLE SODIUM 40 MG PO TBEC
40.0000 mg | DELAYED_RELEASE_TABLET | Freq: Every day | ORAL | Status: DC
  Administered 2018-01-07 – 2018-01-09 (×3): 40 mg via ORAL
  Filled 2018-01-07 (×3): qty 1

## 2018-01-07 MED ORDER — LORAZEPAM 2 MG/ML IJ SOLN: 0.0000 mg | Freq: Four times a day (QID) | INTRAMUSCULAR | Status: DC

## 2018-01-07 MED ORDER — ONDANSETRON HCL 4 MG/2ML IJ SOLN: 4.0000 mg | Freq: Four times a day (QID) | INTRAMUSCULAR | Status: DC | PRN

## 2018-01-07 MED ORDER — SODIUM CHLORIDE 0.9% FLUSH
3.0000 mL | Freq: Two times a day (BID) | INTRAVENOUS | Status: DC
  Administered 2018-01-07 – 2018-01-09 (×3): 3 mL via INTRAVENOUS

## 2018-01-07 MED ORDER — LORAZEPAM 1 MG PO TABS: 0.0000 mg | ORAL_TABLET | Freq: Four times a day (QID) | ORAL | Status: DC

## 2018-01-07 MED ORDER — ATENOLOL 50 MG PO TABS
50.0000 mg | ORAL_TABLET | Freq: Every day | ORAL | Status: DC
  Filled 2018-01-07: qty 1

## 2018-01-07 MED ORDER — IPRATROPIUM-ALBUTEROL 0.5-2.5 (3) MG/3ML IN SOLN: 3.0000 mL | Freq: Four times a day (QID) | RESPIRATORY_TRACT | Status: DC | PRN

## 2018-01-07 MED ORDER — THIAMINE HCL 100 MG/ML IJ SOLN
100.0000 mg | Freq: Every day | INTRAMUSCULAR | Status: DC
  Administered 2018-01-07 – 2018-01-08 (×2): 100 mg via INTRAVENOUS
  Filled 2018-01-07 (×2): qty 2

## 2018-01-07 MED ORDER — LORAZEPAM 2 MG/ML IJ SOLN: 0.0000 mg | Freq: Two times a day (BID) | INTRAMUSCULAR | Status: DC

## 2018-01-07 MED ORDER — GEMFIBROZIL 600 MG PO TABS
600.0000 mg | ORAL_TABLET | Freq: Two times a day (BID) | ORAL | Status: DC
  Administered 2018-01-08 – 2018-01-09 (×3): 600 mg via ORAL
  Filled 2018-01-07 (×4): qty 1

## 2018-01-07 MED ORDER — LORAZEPAM 2 MG/ML IJ SOLN: 2.0000 mg | INTRAMUSCULAR | Status: DC | PRN

## 2018-01-07 MED ORDER — VITAMIN D 1000 UNITS PO TABS
1000.0000 [IU] | ORAL_TABLET | Freq: Every day | ORAL | Status: DC
  Administered 2018-01-08 – 2018-01-09 (×2): 1000 [IU] via ORAL
  Filled 2018-01-07 (×2): qty 1

## 2018-01-07 MED ORDER — VITAMIN B-1 100 MG PO TABS: 100.0000 mg | ORAL_TABLET | Freq: Every day | ORAL | Status: DC

## 2018-01-07 MED ORDER — LORAZEPAM 2 MG/ML IJ SOLN
1.0000 mg | Freq: Once | INTRAMUSCULAR | Status: DC
  Filled 2018-01-07: qty 1

## 2018-01-07 MED ORDER — LEVETIRACETAM IN NACL 1000 MG/100ML IV SOLN
1000.0000 mg | Freq: Once | INTRAVENOUS | Status: AC
  Administered 2018-01-07: 1000 mg via INTRAVENOUS
  Filled 2018-01-07: qty 100

## 2018-01-07 MED ORDER — LORAZEPAM 2 MG/ML IJ SOLN
INTRAMUSCULAR | Status: AC
  Administered 2018-01-07: 1 mg
  Filled 2018-01-07: qty 1

## 2018-01-07 MED ORDER — ONDANSETRON HCL 4 MG PO TABS: 4.0000 mg | ORAL_TABLET | Freq: Four times a day (QID) | ORAL | Status: DC | PRN

## 2018-01-07 MED ORDER — NICOTINE 21 MG/24HR TD PT24
21.0000 mg | MEDICATED_PATCH | Freq: Every day | TRANSDERMAL | Status: DC
  Administered 2018-01-07 – 2018-01-09 (×3): 21 mg via TRANSDERMAL
  Filled 2018-01-07 (×3): qty 1

## 2018-01-07 MED ORDER — ENOXAPARIN SODIUM 40 MG/0.4ML ~~LOC~~ SOLN
40.0000 mg | SUBCUTANEOUS | Status: DC
  Administered 2018-01-08: 40 mg via SUBCUTANEOUS
  Filled 2018-01-07 (×2): qty 0.4

## 2018-01-07 NOTE — ED Notes (Signed)
Ovid Curd- caregiver- per pt.

## 2018-01-07 NOTE — ED Notes (Signed)
Pt given Ativan 1mg  for twitching and shaking, pt became more alert, able to answewr questions correctly and stopped shaking after ativan.

## 2018-01-07 NOTE — ED Notes (Signed)
Regular dinner tray ordered 

## 2018-01-07 NOTE — ED Notes (Signed)
Pt in CT- tremors in all extremities - admits to drinking "at least a 12 pack" every day, starting "in mid afternoon"

## 2018-01-07 NOTE — H&P (Signed)
History and Physical    Blake [name] VQM:[id number] DOB: 08/03/1941 DOA: 01/07/2018  **Will admit patient based on the expectation that the patient will need hospitalization/ hospital care that crosses at least 2 midnights  PCP: Nche, Charlene Brooke, NP   Attending physician: Jamse Arn  Patient coming from/Resides with: Private residence  Chief Complaint: Gait instability and tremors  HPI: Blake [name] is a 77 y.o. male with medical history significant for ongoing alcohol abuse with an average of 12-24 cans of beer a day, hypertension on beta-blockers and ARB, COPD with ongoing tobacco abuse, GERD, dyslipidemia.  Patient also has a history of diverticulosis without history of GI bleeding.  Reportedly had a history of diabetes but in review of multiple hemoglobin A1c's over the past several years they have averaged about 4.5 which is not consistent with diabetes or even prediabetes.  She was brought to this facility by [facility] as a code stroke in the context of unsteady gait and bilateral tremors in lower legs.  Patient was last seen normal at 0800 this morning.  Arrival patient was oriented to person place met by the code stroke team.  CT was completed that did not demonstrate any evidence of acute stroke.  Code stroke was canceled.  Neurology felt symptoms more consistent with either electrolyte imbalance or atypical seizure activity with noted more consistent tremor activity in the left lower extremity.  Alcohol level was < 10.  Arrival patient has had tremor activity that has responded to IV Ativan.  Due to concerns of a possible new onset seizure activity neurology is recommended loading with a 1000 mg of Keppra and begin twice daily Keppra.  Upon my evaluation of patient he was oriented to name only.  He thought he was at home.  There are no focal neurological deficits appreciated exam and currently he is not tremulous.  ED Course:  Vital Signs: BP 114/68   Pulse 78  CT head/code  stroke: As above Lab data: Sodium 134, potassium 3.8, chloride 101, CO2 22, glucose 76, BUN 8, creatinine 0.5, anion gap 11, LFTs are normal, magnesium 1.9, white count 10,500, neutrophils 84% with absolute neutrophils 8.8%, platelets 193,000, hemoglobin 15.9, MCV 101.1, coags are normal, urinalysis collected and pending, urine drug screen pending Medications and treatments: Ativan 1 mg IV x1, Keppra 1 g IV x1  Review of Systems:  In addition to the HPI above,  No Fever-chills, myalgias or other constitutional symptoms No Headache, changes with Vision or hearing, new weakness, tingling, numbness in any extremity, dizziness, dysarthria or word finding difficulty No problems swallowing food or Liquids, indigestion/reflux, choking or coughing while eating, abdominal pain with or after eating No Chest pain, Cough or Shortness of Breath, palpitations, orthopnea or DOE No Abdominal pain, N/V, melena,hematochezia, dark tarry stools, constipation No dysuria, malodorous urine, hematuria or flank pain No new skin rashes, lesions, masses  No new joint pains, aches, swelling or redness No recent unintentional weight gain or loss No polyuria, polydypsia or polyphagia   Past Medical History:  Diagnosis Date  . Chronic insomnia   . Cigarette smoker   . COPD (chronic obstructive pulmonary disease) ([place])   . Diverticulosis of colon   . Elevated prostate specific antigen (PSA)   . ETOH abuse   . GERD (gastroesophageal reflux disease)   . Hx of colonic polyps   . Hyperlipidemia   . Hypertension   . Pulmonary nodule   . Shortness of breath   . Stroke [facility])  Past Surgical History:  Procedure Laterality Date  . COLONOSCOPY    . needle biopsy RUL nodule  01/2009   benign  . TONSILLECTOMY  12/17/45  . VASECTOMY      Social History   Socioeconomic History  . Marital status: Widowed    Spouse name: Mary(deceased 12-18-1998)  . Number of children: 1  . Years of education: Not on file  . Highest  education level: Not on file  Occupational History  . 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 Financial resource strain: Not on file  . Food insecurity:    Worry: Not on file    Inability: Not on file  . Transportation needs:    Medical: Not on file    Non-medical: Not on file  Tobacco Use  . Smoking status: Current Every Day Smoker    Packs/day: 1.00    Years: 55.00    Pack years: 55.00    Types: Cigarettes  . Smokeless tobacco: Never Used  Substance and Sexual Activity  . Alcohol use: Yes    Alcohol/week: 14.4 oz    Types: 24 Cans of beer per week    Comment: 12-24 beer daily  . Drug use: No  . Sexual activity: Never  Lifestyle  . Physical activity:    Days per week: Not on file    Minutes per session: Not on file  . Stress: Not on file  Relationships  . Social connections:    Talks on phone: Not on file    Gets together: Not on file    Attends religious service: Not on file    Active member of club or organization: Not on file    Attends meetings of clubs or organizations: Not on file    Relationship status: Not on file  . Intimate partner violence:    Fear of current or ex partner: Not on file    Emotionally abused: Not on file    Physically abused: Not on file    Forced sexual activity: Not on file  Other Topics Concern  . Not on file  Social History Narrative   1 son that died in a MVA    Mobility: Independent Work history: Retired   No Known Allergies  Family History  Problem Relation Age of Onset  . Diabetes Father   . Lung cancer Brother   . Colon cancer Neg Hx   . Esophageal cancer Neg Hx   . Stomach cancer Neg Hx      Prior to Admission medications   Medication Sig Start Date End Date Taking? Authorizing Provider  aspirin EC 81 MG tablet Take 1 tablet (81 mg total) by mouth daily. 08/27/16   Nche, Charlene Brooke, NP  atenolol (TENORMIN) 50 MG tablet Take 1 tablet (50 mg total) by mouth daily. 08/27/16    Nche, Charlene Brooke, NP  cholecalciferol (VITAMIN D) 1000 units tablet Take 1 tablet (1,000 Units total) by mouth daily. 08/27/16   Nche, Charlene Brooke, NP  folic acid (FOLVITE) 1 MG tablet Take 1 tablet (1 mg total) by mouth daily. 08/27/16   Nche, Charlene Brooke, NP  gemfibrozil (LOPID) 600 MG tablet Take 1 tablet (600 mg total) by mouth 2 (two) times daily. 08/27/16   Nche, Charlene Brooke, NP  losartan (COZAAR) 50 MG tablet Take 1 tablet (50 mg total) by mouth daily. 08/27/16   Nche, Charlene Brooke, NP  oxyCODONE (OXY IR/ROXICODONE) 5 MG immediate release tablet Take 1  tablet (5 mg total) by mouth 3 (three) times daily as needed. NOT TO EXCEED 3 PER DAY Patient taking differently: Take 5 mg by mouth 3 (three) times daily as needed for moderate pain. NOT TO EXCEED 3 PER DAY 10/12/13   Noralee Space, MD  temazepam (RESTORIL) 30 MG capsule Take 1 capsule (30 mg total) by mouth at bedtime as needed for sleep. 08/27/16   Nche, Charlene Brooke, NP  thiamine 100 MG tablet Take 1 tablet (100 mg total) by mouth daily. 08/27/16   Nche, Charlene Brooke, NP  tadalafil (CIALIS) 20 MG tablet Take 20 mg by mouth daily as needed. For ED  12/14/11  [provider]    Physical Exam: Vitals:   01/07/18 1433  BP: 114/68  Pulse: 78      Constitutional: NAD, slightly restless, comfortable Eyes: PERRL, lids and conjunctivae normal ENMT: Mucous membranes are dry. Posterior pharynx clear of any exudate or lesions. Edentulous. Neck: normal, supple, no masses, no thyromegaly Respiratory: clear to auscultation bilaterally, no wheezing, no crackles. Normal respiratory effort. No accessory muscle use.  Cardiovascular: Regular rate and rhythm, no murmurs / rubs / gallops. No extremity edema. 2+ pedal pulses. No carotid bruits.  Abdomen: no tenderness, no masses palpated. No hepatosplenomegaly. Bowel sounds positive.  Musculoskeletal: no clubbing / cyanosis. No joint deformity upper and lower extremities. Good ROM, no  contractures. Normal muscle tone.  Skin: no rashes, lesions, ulcers. No induration Neurologic: CN 2-12 grossly intact. Sensation intact, DTR normal. Strength 5/5 x all 4 extremities.  Psychiatric: Alert and oriented x name only. Normal mood.    Labs on Admission: I have personally reviewed following labs and imaging studies  CBC: Recent Labs  Lab 01/07/18 1340 01/07/18 1344  WBC 10.5  --   NEUTROABS 8.8*  --   HGB 15.9 16.3  HCT 47.1 48.0  MCV 101.1*  --   PLT 193  --    Basic Metabolic Panel: Recent Labs  Lab 01/07/18 1340 01/07/18 1344  NA 134* 136  K 3.8 3.8  CL 101 101  CO2 22  --   GLUCOSE 76 71  BUN 8 9  CREATININE 0.85 0.80  CALCIUM 8.9  --   MG 1.9  --    GFR: CrCl cannot be calculated (Unknown ideal weight.). Liver Function Tests: Recent Labs  Lab 01/07/18 1340  AST 27  ALT 11*  ALKPHOS 72  BILITOT 0.9  PROT 6.5  ALBUMIN 4.0   No results for input(s): LIPASE, AMYLASE in the last 168 hours. No results for input(s): AMMONIA in the last 168 hours. Coagulation Profile: Recent Labs  Lab 01/07/18 1340  INR 1.01   Cardiac Enzymes: No results for input(s): CKTOTAL, CKMB, CKMBINDEX, TROPONINI in the last 168 hours. BNP (last 3 results) No results for input(s): PROBNP in the last 8760 hours. HbA1C: No results for input(s): HGBA1C in the last 72 hours. CBG: Recent Labs  Lab 01/07/18 1338  GLUCAP 82   Lipid Profile: No results for input(s): CHOL, HDL, LDLCALC, TRIG, CHOLHDL, LDLDIRECT in the last 72 hours. Thyroid Function Tests: No results for input(s): TSH, T4TOTAL, FREET4, T3FREE, THYROIDAB in the last 72 hours. Anemia Panel: No results for input(s): VITAMINB12, FOLATE, FERRITIN, TIBC, IRON, RETICCTPCT in the last 72 hours. Urine analysis:    Component Value Date/Time   COLORURINE YELLOW 10/14/2014 1211   APPEARANCEUR CLEAR 10/14/2014 1211   LABSPEC 1.020 10/14/2014 1211   PHURINE 6.0 10/14/2014 1211   GLUCOSEU 100 (A) 10/14/2014  [place] 02/02/2011 1543   HGBUR MODERATE (A) 10/14/2014 1211   BILIRUBINUR NEGATIVE 10/14/2014 1211   KETONESUR 15 (A) 10/14/2014 1211   PROTEINUR 100 (A) 10/14/2014 1211   UROBILINOGEN 1.0 10/14/2014 1211   NITRITE NEGATIVE 10/14/2014 1211   LEUKOCYTESUR NEGATIVE 10/14/2014 1211   Sepsis Labs: '@LABRCNTIP'$ (procalcitonin:4,lacticidven:4) )No results found for this or any previous visit (from the past 240 hour(s)).   Radiological Exams on Admission: Ct Head Code Stroke Wo Contrast  Result Date: 01/07/2018 CLINICAL DATA:  Code stroke.  77 year old male with unsteady gait. EXAM: CT HEAD WITHOUT CONTRAST TECHNIQUE: Contiguous axial images were obtained from the base of the skull through the vertex without intravenous contrast. COMPARISON:  Brain MRI 10/15/2014. Head CT without contrast 10/14/2014. FINDINGS: Brain: Stable cerebral volume since 2016. Patchy bilateral cerebral white matter hypodensity appears mildly progressed in both hemispheres. No cortical encephalomalacia identified. No midline shift, ventriculomegaly, mass effect, evidence of mass lesion, intracranial hemorrhage or evidence of cortically based acute infarction. Vascular: Calcified atherosclerosis at the skull base. No suspicious intracranial vascular hyperdensity. Skull: Osteopenia. Previous left anterior maxillary wall and left lateral orbital wall ORIF. No acute osseous abnormality identified. Sinuses/Orbits: Visualized paranasal sinuses and mastoids are stable and well pneumatized. Other: No acute orbit or scalp soft tissue findings. Negative visible noncontrast deep soft tissue spaces in the face. ASPECTS [facility] Stroke Program Early CT Score) - Ganglionic level infarction (caudate, lentiform nuclei, internal capsule, insula, M1-M3 cortex): 7 - Supraganglionic infarction (M4-M6 cortex): 3 Total score (0-10 with 10 being normal): 10 IMPRESSION: 1. No acute intracranial abnormality.   ASPECTS is 10. 2. Progression of  bilateral white matter hypodensity since 2016, most commonly due to chronic small vessel disease. 3. This patient may have been the escalated from code stroke status, but in case not these results were communicated to Dr. Cheral Marker at [facility] 4/13/2019by text page via the [facility] messaging system. Electronically Signed   By: Genevie Ann M.D.   On: 01/07/2018 14:08    EKG: (Independently reviewed) sinus rhythm with ventricular rate 74 bpm, QTC 443 ms, normal R wave rotation, low voltage R waves in the inferior leads, no acute ischemic changes  Assessment/Plan Principal Problem:   ? New onset seizure -Patient presents with gait disturbance, generalized lower extremity weakness and tremors primarily of the left lower extremity is witnessed by a neurologist suspicious for possible seizure activity; potentially mediated by alcohol withdrawal -Neurology evaluated patient and recommended Keppra load with continue Keppra dosing twice daily -Obtain EEG -Seizure precautions  Active Problems:   Alcohol abuse -Patient admits to 12-24 beers daily-for significant alcohol withdrawal symptomatology -Stepdown level CIWA protocol with Ativan -Frequent neurological checks every 2 hours -Magnesium 1.9-obtain phosphorus level -Appears clinically dehydrated so we will give 1 L IV fluids and continue IV fluids at 100 cc/hr    Hypertension -Current blood pressure somewhat suboptimal with systolic readings between 100 and 110 -Hold Cozaar -Continue atenolol but resume dosing in a.m. on 4/14 -Echocardiogram 2016 preserved LV function and no evidence of diastolic dysfunction or LVH    Chronic obstructive pulmonary disease (COPD)/Cigarette smoker -Currently not hypoxemic and not actively wheezing -DuoNeb prn -Nicotine patch    GERD (gastroesophageal reflux disease) -PPI    Hyperlipidemia -Continue preadmission Lopid    **Additional lab, imaging and/or diagnostic evaluation at discretion of supervising  physician  DVT prophylaxis: Lovenox Code Status: Full Family Communication: No family at bedside Disposition Plan: Home Consults called: Neurology/[place]  Ambers Iyengar L. ANP-BC [facility] Pager [phone]   If 7PM-7AM, please contact night-coverage [email or web address] Password [facility]  01/07/2018, 4:19 PM

## 2018-01-07 NOTE — Consult Note (Signed)
Referring Physician: Dr Vanita Panda    Chief Complaint: Gait instability  HPI: Blake [name] is an 77 y.o. male with history of alcohol abuse, a previous stroke without deficits, known pulmonary nodule, hypertension, baseline unequal pupils, hyperlipidemia, diabetes mellitus, COPD, and tobacco use brought to [facility] today as a code stroke after he was seen by a friend to be stumbling around despite having had minimal alcohol intake today.  EMS reported the possibility of a left upper extremity focal seizure in route.  It is not believed that the patient was on any medication prior to admission.  A CT scan of the head is currently pending.   Date last known well: Date: 01/07/2018 Time last known well: Time: 08:00  tPA Given: Out of the window for TPA therapy; most likely etiology for presentation is alcohol withdrawal  Past Medical History Past Medical History:  Diagnosis Date  . Chronic insomnia   . Cigarette smoker   . COPD (chronic obstructive pulmonary disease) ([place])   . Diabetes mellitus    no per pt, no meds taken  . Diverticulosis of colon   . Elevated prostate specific antigen (PSA)   . ETOH abuse   . GERD (gastroesophageal reflux disease)   . Hx of colonic polyps   . Hyperlipidemia   . Hypertension   . Pulmonary nodule   . Shortness of breath   . Stroke [facility])     Surgical History Past Surgical History:  Procedure Laterality Date  . COLONOSCOPY    . needle biopsy RUL nodule  01/2009   benign  . TONSILLECTOMY  1947  . VASECTOMY      Family History  Family History  Problem Relation Age of Onset  . Diabetes Father   . Lung cancer Brother   . Colon cancer Neg Hx   . Esophageal cancer Neg Hx   . Stomach cancer Neg Hx     Social History:   reports that he has been smoking cigarettes.  He has a 55.00 pack-year smoking history. He has never used smokeless tobacco. He reports that he drinks about 14.4 oz of alcohol per week. He reports that he does not use  drugs.  Allergies:  No Known Allergies  Home Medications:   (Not in a hospital admission)  Hospital Medications  ROS:  History obtained from the patient   General ROS: negative for - chills, fatigue, fever, night sweats, weight gain or weight loss Psychological ROS: negative for - behavioral disorder, hallucinations, memory difficulties, mood swings or suicidal ideation Ophthalmic ROS: negative for - blurry vision, double vision, eye pain or loss of vision ENT ROS: negative for - epistaxis, nasal discharge, oral lesions, sore throat, tinnitus or vertigo Allergy and Immunology ROS: negative for - hives or itchy/watery eyes Hematological and Lymphatic ROS: negative for - bleeding problems, bruising or swollen lymph nodes Endocrine ROS: negative for - galactorrhea, hair pattern changes, polydipsia/polyuria or temperature intolerance Respiratory ROS: negative for - cough, hemoptysis, shortness of breath or wheezing Cardiovascular ROS: negative for - chest pain, dyspnea on exertion, edema or irregular heartbeat Gastrointestinal ROS: negative for - abdominal pain, diarrhea, hematemesis, nausea/vomiting or stool incontinence Genito-Urinary ROS: negative for - dysuria, hematuria, incontinence or urinary frequency/urgency Musculoskeletal ROS: negative for - joint swelling or muscular weakness Neurological ROS: as noted in HPI Dermatological ROS: negative for rash and skin lesion changes   Physical Examination:  Vitals:   01/07/18 1433  BP: 114/68  Pulse: 87    General -77 year old male  in no acute distress Heart - Regular rate and rhythm - no murmer appreciated Lungs - Clear to auscultation anteriorly Abdomen - Soft - non tender Extremities - Distal pulses intact - no edema Skin - Warm and dry   Neurologic Examination:  Mental Status: Alert, oriented to person, place, month and year.  He could not name the current president.  Thought content appropriate.  Speech fluent without  evidence of aphasia. Able to follow 3 step commands without difficulty. Cranial Nerves: II: Discs not visualized; Visual fields grossly normal, the patient's right pupil has had previous surgery.  His left pupil is pinpoint and sluggish. III,IV, VI: ptosis not present, extra-ocular motions intact bilaterally V,VII: smile symmetric, facial light touch sensation normal bilaterally VIII: Mildly hard of hearing IX,X: gag reflex present XI: bilateral shoulder shrug XII: midline tongue extension Motor: Right : Upper extremity   5/5    Left:     Upper extremity   5/5  Lower extremity   5/5     Lower extremity   5/5 Tone and bulk:normal tone throughout; no atrophy noted Sensory: Light touch intact throughout, bilaterally Deep Tendon Reflexes: 2+ and symmetric throughout Plantars: Right: downgoing   Left: downgoing Cerebellar: Significant ataxia bilateral with finger to nose testing Gait: deferred at this time.    LABORATORY STUDIES:  Basic Metabolic Panel: Recent Labs  Lab 01/07/18 1340 01/07/18 1344  NA 134* 136  K 3.8 3.8  CL 101 101  CO2 22  --   GLUCOSE 76 71  BUN 8 9  CREATININE 0.85 0.80  CALCIUM 8.9  --   MG 1.9  --     Liver Function Tests: Recent Labs  Lab 01/07/18 1340  AST 27  ALT 11*  ALKPHOS 72  BILITOT 0.9  PROT 6.5  ALBUMIN 4.0   No results for input(s): LIPASE, AMYLASE in the last 168 hours. No results for input(s): AMMONIA in the last 168 hours.  CBC: Recent Labs  Lab 01/07/18 1340 01/07/18 1344  WBC 10.5  --   NEUTROABS 8.8*  --   HGB 15.9 16.3  HCT 47.1 48.0  MCV 101.1*  --   PLT 193  --     Cardiac Enzymes: No results for input(s): CKTOTAL, CKMB, CKMBINDEX, TROPONINI in the last 168 hours.  BNP: Invalid input(s): POCBNP  CBG: Recent Labs  Lab 01/07/18 [place]    Microbiology:   Coagulation Studies: Recent Labs    01/07/18 1340  LABPROT 13.2  INR 1.01    Urinalysis: No results for input(s): COLORURINE,  LABSPEC, PHURINE, GLUCOSEU, HGBUR, BILIRUBINUR, KETONESUR, PROTEINUR, UROBILINOGEN, NITRITE, LEUKOCYTESUR in the last 168 hours.  Invalid input(s): APPERANCEUR  Lipid Panel:     Component Value Date/Time   CHOL 165 08/27/2016 1020   TRIG 82.0 08/27/2016 1020   HDL 52.20 08/27/2016 1020   CHOLHDL 3 08/27/2016 1020   VLDL 16.4 08/27/2016 1020   LDLCALC 96 08/27/2016 1020    HgbA1C:  Lab Results  Component Value Date   HGBA1C 4.5 (L) 08/27/2016    Urine Drug Screen:      Component Value Date/Time   LABOPIA NONE DETECTED 07/04/2012 1356   COCAINSCRNUR NONE DETECTED 07/04/2012 1356   LABBENZ NONE DETECTED 07/04/2012 1356   AMPHETMU NONE DETECTED 07/04/2012 1356   THCU NONE DETECTED 07/04/2012 1356   LABBARB NONE DETECTED 07/04/2012 1356     Alcohol Level:  Recent Labs  Lab 01/07/18 1340  ETH <10    Miscellaneous labs:  IMAGING:  Ct Head Code Stroke Wo Contrast 01/07/2018 IMPRESSION:  1. No acute intracranial abnormality.   ASPECTS is 10.  2. Progression of bilateral white matter hypodensity since 2016, most commonly due to chronic small vessel disease.    Assessment: 77 y.o. male with alcohol abuse, a previous stroke without residual deficits, known pulmonary nodule, hypertension, baseline unequal pupils, hyperlipidemia, diabetes mellitus, COPD, and tobacco use, who was brought to the emergency department as a code stroke after he was noted to have an unsteady gait.  EMS reported a possible right upper extremity focal seizure en route.  Following his CT scan in the emergency department the patient developed left lower extremity tremor which was progressing to his upper body.  He was given 1 mg of IV Ativan and this resolved completely.  The patient was then loaded with IV Keppra.  The code stroke was canceled.  There is a concern that he is having alcohol withdrawal symptoms.  Stroke Risk Factors -previous stroke, hyperlipidemia, diabetes mellitus, alcohol abuse, and  ongoing tobacco use.  Plan:  Continue to monitor for possible seizure activity  CIWA protocol has been ordered.  Consider an EEG  Hold further Keppra  Will likely need scheduled benzodiazepine for 3 days followed by a taper to prevent EtOH withdrawal seizures and delirium tremens, as he is a heavy drinker   History and examination documented by Lowry Ram, [facility] Pager [phone] 01/07/2018, 2:44 PM  I have seen and examined the patient. I have amended the assessment and plan above.  Electronically signed: Dr. Kerney Elbe

## 2018-01-07 NOTE — ED Notes (Signed)
Blake [name] -- [phone] roommate

## 2018-01-07 NOTE — ED Triage Notes (Signed)
Pt arrives by [employer] as a Code Stroke. Paged out due to pt had unsteady gait and bilateral tremors in lower legs. Pt was LSN at 0800. Pt reports drinking ETOH today and drinks "serveal beers everyday". Pt has no noted weakness, pt is alert and oriented to person and place. Pt was met by stroke team and taken to CT and then code stroke was cancelled.

## 2018-01-07 NOTE — ED Provider Notes (Signed)
[facility] EMERGENCY DEPARTMENT Provider Note   CSN: [id number] Arrival date & time: 01/07/18  1336     History   Chief Complaint Chief Complaint  Patient presents with  . Alcohol Intoxication  . Altered Mental Status    HPI Blake [name] is a 77 y.o. male.  HPI Patient presents as a code stroke. The patient himself has poor hearing, but answers questions directly on arrival. I first evaluated the patient on arrival, and again after he returned back to the resuscitation bay following emergent head CT. Per EMS colleagues felt the patient's baseline gait difficulty was worse today than usual. No report of new fall, trauma. They report that the patient drinks a substantial amount of alcohol daily, and may have been drinking earlier today. The patient himself denies pain, states that he feels about the same as usual, denies any weakness, acknowledges substantial alcohol intake.  Past Medical History:  Diagnosis Date  . Chronic insomnia   . Cigarette smoker   . COPD (chronic obstructive pulmonary disease) ([place])   . Diabetes mellitus    no per pt, no meds taken  . Diverticulosis of colon   . Elevated prostate specific antigen (PSA)   . ETOH abuse   . GERD (gastroesophageal reflux disease)   . Hx of colonic polyps   . Hyperlipidemia   . Hypertension   . Pulmonary nodule   . Shortness of breath   . Stroke [facility])     Patient Active Problem List   Diagnosis Date Noted  . Acute encephalopathy 10/14/2014  . Unsteady gait 10/14/2014  . Alcohol withdrawal delirium ([place]) 10/14/2014  . Lipoma of neck 12/22/2012  . Cerebral atrophy 09/28/2012  . Cerebrovascular small vessel disease 09/28/2012  . Left humeral fracture 07/04/2012  . Confusion 07/04/2012  . ETOH abuse   . ELEVATED PROSTATE SPECIFIC ANTIGEN 09/30/2010  . COPD (chronic obstructive pulmonary disease) ([place]) 01/28/2009  . PULMONARY NODULE 01/28/2009  . CIGARETTE SMOKER 02/07/2008  . INSOMNIA,  CHRONIC 10/15/2007  . COLONIC POLYPS 10/10/2007  . Type 2 diabetes mellitus ([place]) 10/10/2007  . Dyslipidemia 10/10/2007  . Essential hypertension 10/10/2007  . GERD 10/10/2007  . DIVERTICULOSIS OF COLON 10/10/2007    Past Surgical History:  Procedure Laterality Date  . COLONOSCOPY    . needle biopsy RUL nodule  01/2009   benign  . TONSILLECTOMY  1947  . VASECTOMY          Home Medications    Prior to Admission medications   Medication Sig Start Date End Date Taking? Authorizing Provider  aspirin EC 81 MG tablet Take 1 tablet (81 mg total) by mouth daily. 08/27/16   Nche, Charlene Brooke, NP  atenolol (TENORMIN) 50 MG tablet Take 1 tablet (50 mg total) by mouth daily. 08/27/16   Nche, Charlene Brooke, NP  cholecalciferol (VITAMIN D) 1000 units tablet Take 1 tablet (1,000 Units total) by mouth daily. 08/27/16   Nche, Charlene Brooke, NP  folic acid (FOLVITE) 1 MG tablet Take 1 tablet (1 mg total) by mouth daily. 08/27/16   Nche, Charlene Brooke, NP  gemfibrozil (LOPID) 600 MG tablet Take 1 tablet (600 mg total) by mouth 2 (two) times daily. 08/27/16   Nche, Charlene Brooke, NP  losartan (COZAAR) 50 MG tablet Take 1 tablet (50 mg total) by mouth daily. 08/27/16   Nche, Charlene Brooke, NP  oxyCODONE (OXY IR/ROXICODONE) 5 MG immediate release tablet Take 1 tablet (5 mg total) by mouth 3 (three) times daily as  needed. NOT TO EXCEED 3 PER DAY Patient taking differently: Take 5 mg by mouth 3 (three) times daily as needed for moderate pain. NOT TO EXCEED 3 PER DAY 10/12/13   Noralee Space, MD  temazepam (RESTORIL) 30 MG capsule Take 1 capsule (30 mg total) by mouth at bedtime as needed for sleep. 08/27/16   Nche, Charlene Brooke, NP  thiamine 100 MG tablet Take 1 tablet (100 mg total) by mouth daily. 08/27/16   Nche, Charlene Brooke, NP  tadalafil (CIALIS) 20 MG tablet Take 20 mg by mouth daily as needed. For ED  12/14/11  [provider]    Family History Family History  Problem Relation Age of Onset   . Diabetes Father   . Lung cancer Brother   . Colon cancer Neg Hx   . Esophageal cancer Neg Hx   . Stomach cancer Neg Hx     Social History Social History   Tobacco Use  . Smoking status: Current Every Day Smoker    Packs/day: 1.00    Years: 55.00    Pack years: 55.00    Types: Cigarettes  . Smokeless tobacco: Never Used  Substance Use Topics  . Alcohol use: Yes    Alcohol/week: 14.4 oz    Types: 24 Cans of beer per week    Comment: 12-24 beer daily  . Drug use: No     Allergies   Patient has no known allergies.   Review of Systems Review of Systems  Constitutional:       Per HPI, otherwise negative  HENT:       Per HPI, otherwise negative  Respiratory:       Per HPI, otherwise negative  Cardiovascular:       Per HPI, otherwise negative  Gastrointestinal: Negative for vomiting.  Endocrine:       Negative aside from HPI  Genitourinary:       Neg aside from HPI   Musculoskeletal:       Per HPI, otherwise negative  Skin: Negative.   Neurological: Positive for tremors and weakness. Negative for syncope.  Psychiatric/Behavioral:       Alcohol abuse     Physical Exam Updated Vital Signs BP 114/68   Pulse 78   Physical Exam  Constitutional: He is oriented to person, place, and time. He has a sickly appearance. No distress.  Frail elderly appearing male awake and alert speaking loudly  HENT:  Head: Normocephalic and atraumatic.  Eyes: Conjunctivae and EOM are normal.  Cardiovascular: Normal rate and regular rhythm.  Pulmonary/Chest: Effort normal. No stridor. No respiratory distress.  Abdominal: He exhibits no distension.  Musculoskeletal: He exhibits no edema.  Neurological: He is alert and oriented to person, place, and time. He displays atrophy and tremor.  Patient moves all extremities spontaneously, elevates them against gravity, has speech that is brief, but clear. Hearing is very poor, but grossly intact.  He has inconsistent tremor in the  left distal lower extremity.  Skin: Skin is warm and dry.  Psychiatric: He has a normal mood and affect.  Nursing note and vitals reviewed.    ED Treatments / Results  Labs (all labs ordered are listed, but only abnormal results are displayed) Labs Reviewed  CBC - Abnormal; Notable for the following components:      Result Value   MCV 101.1 (*)    MCH 34.1 (*)    All other components within normal limits  DIFFERENTIAL - Abnormal; Notable for the following components:  Neutro Abs 8.8 (*)    All other components within normal limits  COMPREHENSIVE METABOLIC PANEL - Abnormal; Notable for the following components:   Sodium 134 (*)    ALT 11 (*)    All other components within normal limits  I-STAT CHEM 8, ED - Abnormal; Notable for the following components:   Calcium, Ion 1.11 (*)    All other components within normal limits  PROTIME-INR  APTT  ETHANOL  MAGNESIUM  URINALYSIS, ROUTINE W REFLEX MICROSCOPIC  RAPID URINE DRUG SCREEN, HOSP PERFORMED  I-STAT TROPONIN, ED  CBG MONITORING, ED    EKG EKG Interpretation  Date/Time:  Saturday January 07 2018 14:03:00 EDT Ventricular Rate:  74 PR Interval:    QRS Duration: 104 QT Interval:  399 QTC Calculation: 443 R Axis:   -69 Text Interpretation:  Sinus rhythm Prolonged PR interval Left anterior fascicular block Anterior injury pattern No significant change since last tracing Abnormal ekg Confirmed by Carmin Muskrat [phone]) on 01/07/2018 2:08:38 PM Also confirmed by Carmin Muskrat ([id number]), editor Philomena Doheny [phone])  on 01/07/2018 2:46:29 PM   Radiology Ct Head Code Stroke Wo Contrast  Result Date: 01/07/2018 CLINICAL DATA:  Code stroke.  77 year old male with unsteady gait. EXAM: CT HEAD WITHOUT CONTRAST TECHNIQUE: Contiguous axial images were obtained from the base of the skull through the vertex without intravenous contrast. COMPARISON:  Brain MRI 10/15/2014. Head CT without contrast 10/14/2014. FINDINGS: Brain: Stable  cerebral volume since 2016. Patchy bilateral cerebral white matter hypodensity appears mildly progressed in both hemispheres. No cortical encephalomalacia identified. No midline shift, ventriculomegaly, mass effect, evidence of mass lesion, intracranial hemorrhage or evidence of cortically based acute infarction. Vascular: Calcified atherosclerosis at the skull base. No suspicious intracranial vascular hyperdensity. Skull: Osteopenia. Previous left anterior maxillary wall and left lateral orbital wall ORIF. No acute osseous abnormality identified. Sinuses/Orbits: Visualized paranasal sinuses and mastoids are stable and well pneumatized. Other: No acute orbit or scalp soft tissue findings. Negative visible noncontrast deep soft tissue spaces in the face. ASPECTS [facility] Stroke Program Early CT Score) - Ganglionic level infarction (caudate, lentiform nuclei, internal capsule, insula, M1-M3 cortex): 7 - Supraganglionic infarction (M4-M6 cortex): 3 Total score (0-10 with 10 being normal): 10 IMPRESSION: 1. No acute intracranial abnormality.   ASPECTS is 10. 2. Progression of bilateral white matter hypodensity since 2016, most commonly due to chronic small vessel disease. 3. This patient may have been the escalated from code stroke status, but in case not these results were communicated to Dr. Cheral Marker at [facility] 4/13/2019by text page via the [facility] messaging system. Electronically Signed   By: Genevie Ann M.D.   On: 01/07/2018 14:08    Procedures Procedures (including critical care time)  Medications Ordered in ED Medications  LORazepam (ATIVAN) injection 1 mg (has no administration in time range)  levETIRAcetam (KEPPRA) IVPB 500 mg/100 mL premix (has no administration in time range)  LORazepam (ATIVAN) injection 1 mg (1 mg Intravenous Not Given 01/07/18 1452)  LORazepam (ATIVAN) injection 0-4 mg (has no administration in time range)    Or  LORazepam (ATIVAN) tablet 0-4 mg (has no administration in time range)    LORazepam (ATIVAN) injection 0-4 mg (has no administration in time range)    Or  LORazepam (ATIVAN) tablet 0-4 mg (has no administration in time range)  thiamine (VITAMIN B-1) tablet 100 mg (has no administration in time range)    Or  thiamine (B-1) injection 100 mg (has no administration in time range)  LORazepam (ATIVAN)  2 MG/ML injection (1 mg  Given 01/07/18 1420)  levETIRAcetam (KEPPRA) IVPB 1000 mg/100 mL premix (1,000 mg Intravenous New Bag/Given 01/07/18 1452)     Initial Impression / Assessment and Plan / ED Course  I have reviewed the triage vital signs and the nursing notes.  Pertinent labs & imaging results that were available during my care of the patient were reviewed by me and considered in my medical decision making (see chart for details).     3:37 PM Patient calm, receiving Keppra, has received several doses of Ativan. Initial labs generally reassuring, though notable for absence of alcohol Given that the patient drinks approximately 12 beers daily, he may be experiencing withdrawal, though the shaking in his left leg would be an atypical isolated demonstration of this. With concern for seizure versus stroke, with consideration of withdrawal, the patient will be admitted. He is currently receiving his IV Keppra, Ativan, fluids, calm, hemodynamically stable.   Final Clinical Impressions(s) / ED Diagnoses  Altered mental status Tremor   Carmin Muskrat, MD 01/07/18 1538

## 2018-01-07 NOTE — Progress Notes (Signed)
Pt arived on the unit from ED via stretcher.

## 2018-01-07 NOTE — ED Notes (Signed)
Code stroke cancelled per Dr. Lindzen 

## 2018-01-08 ENCOUNTER — Other Ambulatory Visit: Payer: Self-pay

## 2018-01-08 DIAGNOSIS — E785 Hyperlipidemia, unspecified: Secondary | ICD-10-CM

## 2018-01-08 DIAGNOSIS — F17210 Nicotine dependence, cigarettes, uncomplicated: Secondary | ICD-10-CM

## 2018-01-08 DIAGNOSIS — K219 Gastro-esophageal reflux disease without esophagitis: Secondary | ICD-10-CM

## 2018-01-08 LAB — COMPREHENSIVE METABOLIC PANEL
ALT: 10 U/L — ABNORMAL LOW (ref 17–63)
ANION GAP: 7 (ref 5–15)
AST: 22 U/L (ref 15–41)
Albumin: 3.1 g/dL — ABNORMAL LOW (ref 3.5–5.0)
Alkaline Phosphatase: 60 U/L (ref 38–126)
BILIRUBIN TOTAL: 1.1 mg/dL (ref 0.3–1.2)
BUN: 7 mg/dL (ref 6–20)
CALCIUM: 9 mg/dL (ref 8.9–10.3)
CO2: 25 mmol/L (ref 22–32)
CREATININE: 0.86 mg/dL (ref 0.61–1.24)
Chloride: 108 mmol/L (ref 101–111)
Glucose, Bld: 102 mg/dL — ABNORMAL HIGH (ref 65–99)
Potassium: 4.1 mmol/L (ref 3.5–5.1)
Sodium: 140 mmol/L (ref 135–145)
TOTAL PROTEIN: 5.4 g/dL — AB (ref 6.5–8.1)

## 2018-01-08 MED ORDER — VITAMIN B-1 100 MG PO TABS
100.0000 mg | ORAL_TABLET | Freq: Every day | ORAL | Status: DC
  Administered 2018-01-09: 100 mg via ORAL
  Filled 2018-01-08: qty 1

## 2018-01-08 MED ORDER — PNEUMOCOCCAL VAC POLYVALENT 25 MCG/0.5ML IJ INJ: 0.5000 mL | INJECTION | INTRAMUSCULAR | Status: DC

## 2018-01-08 NOTE — Progress Notes (Signed)
PROGRESS NOTE    Pesach Frisch Milner  ZOX:[id number] DOB: 02-06-1941 DOA: 01/07/2018 PCP: Flossie Buffy, NP    Brief Narrative:  77 year old male who presented with gait instability and tremors.  He does have the significant past medical history of alcohol abuse, 12-24 cans of beer daily, hypertension, COPD, tobacco abuse, GERD and dyslipidemia.  His family noted him to be confused, having tremors and unsteady gait.  Symptoms were acute in onset, occurring in the early morning of the day of admission.  On his initial physical examination his blood pressure was 114/68, heart rate 78.  He was awake and alert, disoriented, dry mucous membranes, lungs clear to auscultation bilaterally, heart S1-S2 present rhythmic, the abdomen was soft nontender, no lower extremity edema.  Patient was nonfocal.  Positive tremors.  Sodium 134, potassium 3.8, chloride 101, bicarb 22, glucose 76, BUN 8, creatinine 0.85, magnesium 1.9, AST 27, ALT 11, white count 10.5, hemoglobin 15.9, hematocrit 47.1, platelets 193.  Urine analysis specific gravity 1.014, 6-30 RBCs, 0-5 white cells.  Drug screen was negative, alcohol level less than 10.  Head CT with no acute changes.  EKG sinus rhythm, 74 bpm, left axis deviation, first-degree AV block, poor R wave progression.   Patient was admitted to the hospital with a working diagnosis of altered mentation, likely alcohol withdrawal syndrome, to rule out new onset partial seizures   Assessment & Plan:   Principal Problem:   New onset seizure ([place]) Active Problems:   Delirium   Hypertension   GERD (gastroesophageal reflux disease)   Hyperlipidemia   Chronic obstructive pulmonary disease (COPD) ([place])   Cigarette smoker   Alcohol abuse   1.  Alcohol withdrawal syndrome. Patient is more awake and alert, no tremors or anxiety, non focal. Will continue benzodiazepines per protocol, neuro checks and physical therapy evaluation. Out of bed as tolerated. Tolerating po diet well.  Holding on further antiepileptic agents. Continue thiamine and dc folic acid. Continue temazepam for now, as needed.   2.  Hypertension with bradycardia. Will continue blood pressure monitoring, will hold on atenolol. Blood pressure systolic 811 mmHg  3.  Dyslipidemia. Continue gemfibrozil therapy   4.  COPD. No clinical signs of exacerbation, will continue oxymetry monitoring and as needed supplemental 02 per [place]. Continue duoneb.  5. Tobacco abuse. Nicotine patch for smoking cessation.    DVT prophylaxis: enoxaparin   Code Status:  full Family Communication: no family at the bedside Disposition Plan: home on am if continue to be stable   Consultants:   Neurology   Procedures:     Antimicrobials:       Subjective: Patient is feeling better, no nausea or vomiting, no tremors, no chest pain or dyspnea. Patient confirms symptoms may be related to withdrawal for etho. Decreased hearing on the left.   Objective: Vitals:   01/07/18 2130 01/07/18 2350 01/08/18 0337 01/08/18 0600  BP: 132/68 133/64 120/70   Pulse: (!) 59 (!) 57 (!) 49   Resp: 18 18 16    Temp:  98.2 F (36.8 C) 98 F (36.7 C)   TempSrc:  Oral Oral   SpO2: 99% 99% (!) 89%   Weight:   67.7 kg (149 lb 4 oz)   Height:    5\' 11"  (1.803 m)    Intake/Output Summary (Last 24 hours) at 01/08/2018 0759 Last data filed at 01/08/2018 0600 Gross per 24 hour  Intake 1070 ml  Output 1100 ml  Net -30 ml   [employer]  01/08/18 0337  Weight: 67.7 kg (149 lb 4 oz)    Examination:   General: Not in pain or dyspnea, deconditioned Neurology: Awake and alert, non focal/ no tremors.   E ENT: mild pallor, no icterus, oral mucosa moist Cardiovascular: No JVD. S1-S2 present, rhythmic, no gallops, rubs, or murmurs. No lower extremity edema. Pulmonary: vesicular breath sounds bilaterally, adequate air movement, no wheezing, rhonchi or rales. Gastrointestinal. Abdomen with no organomegaly, non tender, no rebound or  guarding Skin. No rashes Musculoskeletal: no joint deformities     Data Reviewed: I have personally reviewed following labs and imaging studies  CBC: Recent Labs  Lab 01/07/18 1340 01/07/18 1344  WBC 10.5  --   NEUTROABS 8.8*  --   HGB 15.9 16.3  HCT 47.1 48.0  MCV 101.1*  --   PLT 193  --    Basic Metabolic Panel: Recent Labs  Lab 01/07/18 1340 01/07/18 1344 01/07/18 1558 01/08/18 0627  NA 134* 136  --  140  K 3.8 3.8  --  4.1  CL 101 101  --  108  CO2 22  --   --  25  GLUCOSE 76 71  --  102*  BUN 8 9  --  7  CREATININE 0.85 0.80  --  0.86  CALCIUM 8.9  --   --  9.0  MG 1.9  --   --   --   PHOS  --   --  2.8  --    GFR: Estimated Creatinine Clearance: 70 mL/min (by C-G formula based on SCr of 0.86 mg/dL). Liver Function Tests: Recent Labs  Lab 01/07/18 1340 01/08/18 0627  AST 27 22  ALT 11* 10*  ALKPHOS 72 60  BILITOT 0.9 1.1  PROT 6.5 5.4*  ALBUMIN 4.0 3.1*   No results for input(s): LIPASE, AMYLASE in the last 168 hours. No results for input(s): AMMONIA in the last 168 hours. Coagulation Profile: Recent Labs  Lab 01/07/18 1340  INR 1.01   Cardiac Enzymes: No results for input(s): CKTOTAL, CKMB, CKMBINDEX, TROPONINI in the last 168 hours. BNP (last 3 results) No results for input(s): PROBNP in the last 8760 hours. HbA1C: No results for input(s): HGBA1C in the last 72 hours. CBG: Recent Labs  Lab 01/07/18 1338  GLUCAP 82   Lipid Profile: No results for input(s): CHOL, HDL, LDLCALC, TRIG, CHOLHDL, LDLDIRECT in the last 72 hours. Thyroid Function Tests: No results for input(s): TSH, T4TOTAL, FREET4, T3FREE, THYROIDAB in the last 72 hours. Anemia Panel: Recent Labs    01/07/18 1639  VITAMINB12 120*      Radiology Studies: I have reviewed all of the imaging during this hospital visit personally     Scheduled Meds: . aspirin EC  81 mg Oral Daily  . atenolol  50 mg Oral Daily  . cholecalciferol  1,000 Units Oral Daily  .  enoxaparin (LOVENOX) injection  40 mg Subcutaneous Q24H  . folic acid  1 mg Intravenous Daily  . gemfibrozil  600 mg Oral BID AC  . nicotine  21 mg Transdermal Daily  . pantoprazole  40 mg Oral Daily  . sodium chloride flush  3 mL Intravenous Q12H  . thiamine  100 mg Intravenous Daily   Continuous Infusions: . sodium chloride 100 mL/hr at 01/08/18 5009     LOS: 1 day        Tawni Millers, MD [facility] Pager [phone]

## 2018-01-09 ENCOUNTER — Inpatient Hospital Stay (HOSPITAL_COMMUNITY): Payer: Medicare Other

## 2018-01-09 DIAGNOSIS — G319 Degenerative disease of nervous system, unspecified: Secondary | ICD-10-CM

## 2018-01-09 DIAGNOSIS — R569 Unspecified convulsions: Secondary | ICD-10-CM

## 2018-01-09 LAB — BASIC METABOLIC PANEL
ANION GAP: 7 (ref 5–15)
BUN: 13 mg/dL (ref 6–20)
CALCIUM: 8.8 mg/dL — AB (ref 8.9–10.3)
CO2: 23 mmol/L (ref 22–32)
CREATININE: 1 mg/dL (ref 0.61–1.24)
Chloride: 109 mmol/L (ref 101–111)
Glucose, Bld: 118 mg/dL — ABNORMAL HIGH (ref 65–99)
Potassium: 3.9 mmol/L (ref 3.5–5.1)
SODIUM: 139 mmol/L (ref 135–145)

## 2018-01-09 MED ORDER — ORAL CARE MOUTH RINSE: 15.0000 mL | Freq: Two times a day (BID) | OROMUCOSAL | Status: DC

## 2018-01-09 NOTE — Discharge Summary (Signed)
Physician Discharge Summary  Blake [name] QMV:[id number] DOB: 1941-07-14 DOA: 01/07/2018  PCP: Flossie Buffy, NP  Admit date: 01/07/2018 Discharge date: 01/09/2018  Admitted From: Home Disposition:  Home  Recommendations for Outpatient Follow-up and new medication changes:  1. Follow up with PCP in 1- week 2. Instructed to avoid alcohol. 3. Follow with neurology as outpatient in 2 to 3 months, for possible brain MRI and EEG.   Home Health: no   Equipment/Devices: no    Discharge Condition: stable CODE STATUS: full  Diet recommendation: Regular.   Brief/Interim Summary: 77 year old male who presented with gait instability and tremors.  He does have the significant past medical history of alcohol abuse, 12-24 cans of beer daily, hypertension, COPD, tobacco abuse, GERD and dyslipidemia.  His family noted him to be confused, having tremors and unsteady gait.  Symptoms were acute in onset, occurring in the early morning of the day of admission.  On his initial physical examination his blood pressure was 114/68, heart rate 78.  He was awake and alert, disoriented, dry mucous membranes, lungs clear to auscultation bilaterally, heart S1-S2 present rhythmic, the abdomen was soft nontender, no lower extremity edema.  Patient was nonfocal.  Positive tremors.  Sodium 134, potassium 3.8, chloride 101, bicarb 22, glucose 76, BUN 8, creatinine 0.85, magnesium 1.9, AST 27, ALT 11, white count 10.5, hemoglobin 15.9, hematocrit 47.1, platelets 193.  Urine analysis specific gravity 1.014, 6-30 RBCs, 0-5 white cells.  Drug screen was negative, alcohol level less than 10.  Head CT with no acute changes.  EKG sinus rhythm, 74 bpm, left axis deviation, first-degree AV block, poor R wave progression.   Patient was admitted to the hospital with a working diagnosis of altered mentation, likely alcohol withdrawal syndrome, to rule out new onset partial seizures.  1.  Alcohol withdrawal syndrome.  Patient was  admitted to the medical ward, he was placed on a remote telemetry monitor, frequent neuro checks, received benzodiazepines per protocol, as well as thiamine and multivitamins.  He responded well to medical therapy, he had significant improvement of his symptoms, returning back to his baseline.  Physical therapy evaluation before discharge.  Patient was advised about alcohol consumption cessation, and importance of follow-up as an outpatient.   Patient was seen by neurology, seizures, were ruled out.  Patient will follow-up in 2 to 3 months with outpatient neurology clinic for possible brain MRI and electroencephalography.  2.  Hypertension with bradycardia.  Patient does have first-degree AV block, atenolol was discontinued, continue losartan.  Discharge systolic blood pressure 284-132.  Avoid AV blocking agents.  Further titration of blood pressure medications as an outpatient.  3.  Dyslipidemia.  Patient continue gemfibrozil.  4.  COPD.  No signs of acute exacerbation, patient received bronchodilator therapy as needed.  5.  Tobacco abuse.  Patient received a nicotine patch during his hospitalization, and smoking cessation counseling.   Discharge Diagnoses:  Principal Problem:   New onset seizure [facility]) Active Problems:   Delirium   Hypertension   GERD (gastroesophageal reflux disease)   Hyperlipidemia   Chronic obstructive pulmonary disease (COPD) ([place])   Cigarette smoker   Alcohol abuse    Discharge Instructions   Allergies as of 01/09/2018   No Known Allergies     Medication List    STOP taking these medications   atenolol 50 MG tablet Commonly known as:  TENORMIN     TAKE these medications   aspirin EC 81 MG tablet Take 1 tablet (81  mg total) by mouth daily.   cholecalciferol 1000 units tablet Commonly known as:  VITAMIN D Take 1 tablet (1,000 Units total) by mouth daily.   folic acid 1 MG tablet Commonly known as:  FOLVITE Take 1 tablet (1 mg total) by mouth  daily.   gemfibrozil 600 MG tablet Commonly known as:  LOPID Take 1 tablet (600 mg total) by mouth 2 (two) times daily.   losartan 50 MG tablet Commonly known as:  COZAAR Take 1 tablet (50 mg total) by mouth daily.   oxyCODONE 5 MG immediate release tablet Commonly known as:  Oxy IR/ROXICODONE Take 1 tablet (5 mg total) by mouth 3 (three) times daily as needed. NOT TO EXCEED 3 PER DAY What changed:    reasons to take this  additional instructions   temazepam 30 MG capsule Commonly known as:  RESTORIL Take 1 capsule (30 mg total) by mouth at bedtime as needed for sleep.   thiamine 100 MG tablet Take 1 tablet (100 mg total) by mouth daily.       No Known Allergies  Consultations:  Neurology    Procedures/Studies: Ct Head Code Stroke Wo Contrast  Result Date: 01/07/2018 CLINICAL DATA:  Code stroke.  77 year old male with unsteady gait. EXAM: CT HEAD WITHOUT CONTRAST TECHNIQUE: Contiguous axial images were obtained from the base of the skull through the vertex without intravenous contrast. COMPARISON:  Brain MRI 10/15/2014. Head CT without contrast 10/14/2014. FINDINGS: Brain: Stable cerebral volume since 2016. Patchy bilateral cerebral white matter hypodensity appears mildly progressed in both hemispheres. No cortical encephalomalacia identified. No midline shift, ventriculomegaly, mass effect, evidence of mass lesion, intracranial hemorrhage or evidence of cortically based acute infarction. Vascular: Calcified atherosclerosis at the skull base. No suspicious intracranial vascular hyperdensity. Skull: Osteopenia. Previous left anterior maxillary wall and left lateral orbital wall ORIF. No acute osseous abnormality identified. Sinuses/Orbits: Visualized paranasal sinuses and mastoids are stable and well pneumatized. Other: No acute orbit or scalp soft tissue findings. Negative visible noncontrast deep soft tissue spaces in the face. ASPECTS [facility] Stroke Program Early CT Score) -  Ganglionic level infarction (caudate, lentiform nuclei, internal capsule, insula, M1-M3 cortex): 7 - Supraganglionic infarction (M4-M6 cortex): 3 Total score (0-10 with 10 being normal): 10 IMPRESSION: 1. No acute intracranial abnormality.   ASPECTS is 10. 2. Progression of bilateral white matter hypodensity since 2016, most commonly due to chronic small vessel disease. 3. This patient may have been the escalated from code stroke status, but in case not these results were communicated to Dr. Cheral Marker at [facility] [place] 4/13/2019by text page via the [facility] messaging system. Electronically Signed   By: Genevie Ann M.D.   On: 01/07/2018 14:08       Subjective: Patient is feeling, well no nausea or vomiting, no tremors, has been out of bed. No chest pain.   Discharge Exam: Vitals:   01/09/18 0500 01/09/18 0825  BP: (!) 166/87 (!) 150/66  Pulse: 62 (!) 51  Resp: 18 18  Temp: 98 F (36.7 C) 98.2 F (36.8 C)  SpO2: 99% 98%   Vitals:   01/08/18 2000 01/09/18 0000 01/09/18 0500 01/09/18 0825  BP: 136/60 (!) 150/72 (!) 166/87 (!) 150/66  Pulse: 68 (!) 58 62 (!) 51  Resp: 18 18 18 18   Temp: 98.6 F (37 C) 98.2 F (36.8 C) 98 F (36.7 C) 98.2 F (36.8 C)  TempSrc: Oral Oral Oral Oral  SpO2: 97% 97% 99% 98%  Weight:      Height:  General: Not in pain or dyspnea  Neurology: Awake and alert, non focal  E ENT: no pallor, no icterus, oral mucosa moist Cardiovascular: No JVD. S1-S2 present, rhythmic, no gallops, rubs, or murmurs. No lower extremity edema. Pulmonary: vesicular breath sounds bilaterally, adequate air movement, no wheezing, rhonchi or rales. Gastrointestinal. Abdomen flat, no organomegaly, non tender, no rebound or guarding Skin. No rashes Musculoskeletal: no joint deformities   The results of significant diagnostics from this hospitalization (including imaging, microbiology, ancillary and laboratory) are listed below for reference.     Microbiology: No results found for this  or any previous visit (from the past 240 hour(s)).   Labs: BNP (last 3 results) No results for input(s): BNP in the last 8760 hours. Basic Metabolic Panel: Recent Labs  Lab 01/07/18 1340 01/07/18 1344 01/07/18 1558 01/08/18 0627 01/09/18 0335  NA 134* 136  --  140 139  K 3.8 3.8  --  4.1 3.9  CL 101 101  --  108 109  CO2 22  --   --  25 23  GLUCOSE 76 71  --  102* 118*  BUN 8 9  --  7 13  CREATININE 0.85 0.80  --  0.86 1.00  CALCIUM 8.9  --   --  9.0 8.8*  MG 1.9  --   --   --   --   PHOS  --   --  2.8  --   --    Liver Function Tests: Recent Labs  Lab 01/07/18 1340 01/08/18 0627  AST 27 22  ALT 11* 10*  ALKPHOS 72 60  BILITOT 0.9 1.1  PROT 6.5 5.4*  ALBUMIN 4.0 3.1*   No results for input(s): LIPASE, AMYLASE in the last 168 hours. No results for input(s): AMMONIA in the last 168 hours. CBC: Recent Labs  Lab 01/07/18 1340 01/07/18 1344  WBC 10.5  --   NEUTROABS 8.8*  --   HGB 15.9 16.3  HCT 47.1 48.0  MCV 101.1*  --   PLT 193  --    Cardiac Enzymes: No results for input(s): CKTOTAL, CKMB, CKMBINDEX, TROPONINI in the last 168 hours. BNP: Invalid input(s): POCBNP CBG: Recent Labs  Lab 01/07/18 1338  GLUCAP 82   D-Dimer No results for input(s): DDIMER in the last 72 hours. Hgb A1c No results for input(s): HGBA1C in the last 72 hours. Lipid Profile No results for input(s): CHOL, HDL, LDLCALC, TRIG, CHOLHDL, LDLDIRECT in the last 72 hours. Thyroid function studies No results for input(s): TSH, T4TOTAL, T3FREE, THYROIDAB in the last 72 hours.  Invalid input(s): FREET3 Anemia work up Recent Labs    01/07/18 1639  VITAMINB12 120*   Urinalysis    Component Value Date/Time   COLORURINE YELLOW 01/07/2018 [place] 01/07/2018 1404   LABSPEC 1.014 01/07/2018 1404   PHURINE 6.0 01/07/2018 1404   GLUCOSEU NEGATIVE 01/07/2018 1404   GLUCOSEU NEGATIVE 02/02/2011 1543   HGBUR MODERATE (A) 01/07/2018 1404   BILIRUBINUR NEGATIVE  01/07/2018 1404   KETONESUR NEGATIVE 01/07/2018 1404   PROTEINUR NEGATIVE 01/07/2018 1404   UROBILINOGEN 1.0 10/14/2014 1211   NITRITE NEGATIVE 01/07/2018 1404   LEUKOCYTESUR NEGATIVE 01/07/2018 1404   Sepsis Labs Invalid input(s): PROCALCITONIN,  WBC,  LACTICIDVEN Microbiology No results found for this or any previous visit (from the past 240 hour(s)).   Time coordinating discharge: 45 minutes  SIGNED:   Tawni Millers, MD  [facility] 01/09/2018, 8:35 AM Pager [phone]  If 7PM-7AM, please contact  night-coverage [email or web address] Password [email or web address]

## 2018-01-09 NOTE — Evaluation (Signed)
Physical Therapy Evaluation Patient Details Name: Blake [name] MRN: [id number] DOB: 1941/01/26 Today's Date: 01/09/2018   History of Present Illness  Blake [name] is a 77 y.o. male with medical history significant for ongoing alcohol abuse, hypertension on beta-blockers and ARB, COPD with ongoing tobacco abuse, GERD, dyslipidemia. and diverticulitis.  Admitted due to tremors, confusion and unsteady gait.  Clinical Impression  Patient presents with some decreased balance per his report, but likely close to functional baseline.  Educated about fall risk reduction tips for home as well as relying on his friends with whom he lives to assist as needed.  No further skilled PT needs at this time, will sign off.    Follow Up Recommendations No PT follow up    Equipment Recommendations  None recommended by PT    Recommendations for Other Services       Precautions / Restrictions Precautions Precautions: Fall Precaution Comments: seizure; reports at least 2 falls in 6 months      Mobility  Bed Mobility Overal bed mobility: Modified Independent                Transfers Overall transfer level: Modified independent               General transfer comment: increased time and uses UE support to stand  Ambulation/Gait Ambulation/Gait assistance: Min guard;Supervision Ambulation Distance (Feet): 200 Feet Assistive device: None Gait Pattern/deviations: Step-through pattern;Wide base of support;Decreased stride length;Drifts right/left     General Gait Details: unsteady initially, but improved with time/distance, also had pt looking to sides for finding his room without LOB  Stairs Stairs: Yes Stairs assistance: Min guard;Supervision Stair Management: Alternating pattern;Forwards;No rails;Two rails Number of Stairs: 4 General stair comments: initially no rails, but cues to use rails and noted improved safety with switch from minguard to S  Wheelchair Mobility     Modified Rankin (Stroke Patients Only)       Balance Overall balance assessment: Needs assistance   Sitting balance-Leahy Scale: Good       Standing balance-Leahy Scale: Good               High level balance activites: Head turns;Sudden stops;Turns;Direction changes High Level Balance Comments: minguard to S for high level balance activities as noted             Pertinent Vitals/Pain Pain Assessment: No/denies pain    Home Living Family/patient expects to be discharged to:: Private residence Living Arrangements: Non-relatives/Friends Available Help at Discharge: Friend(s);Available PRN/intermittently Type of Home: Mobile home Home Access: Stairs to enter;Ramped entrance Entrance Stairs-Rails: Right;Left Entrance Stairs-Number of Steps: 5 Home Layout: One level Home Equipment: Walker - 2 wheels      Prior Function Level of Independence: Independent         Comments: fallen couple of times in six months     Hand Dominance   Dominant Hand: Right    Extremity/Trunk Assessment   Upper Extremity Assessment Upper Extremity Assessment: Overall WFL for tasks assessed    Lower Extremity Assessment Lower Extremity Assessment: Overall WFL for tasks assessed       Communication   Communication: No difficulties  Cognition Arousal/Alertness: Awake/alert Behavior During Therapy: WFL for tasks assessed/performed Overall Cognitive Status: Within Functional Limits for tasks assessed  General Comments General comments (skin integrity, edema, etc.): Educated on safety for fall prevetion including lighting, sitting until clear headed and footwear    Exercises     Assessment/Plan    PT Assessment Patent does not need any further PT services  PT Problem List         PT Treatment Interventions      PT Goals (Current goals can be found in the Care Plan section)  Acute Rehab PT Goals Patient  Stated Goal: to leave as soon as his ride comes PT Goal Formulation: All assessment and education complete, DC therapy    Frequency     Barriers to discharge        Co-evaluation               AM-PAC PT "6 Clicks" Daily Activity  Outcome Measure Difficulty turning over in bed (including adjusting bedclothes, sheets and blankets)?: None Difficulty moving from lying on back to sitting on the side of the bed? : None Difficulty sitting down on and standing up from a chair with arms (e.g., wheelchair, bedside commode, etc,.)?: None Help needed moving to and from a bed to chair (including a wheelchair)?: None Help needed walking in hospital room?: A Little Help needed climbing 3-5 steps with a railing? : A Little 6 Click Score: 22    End of Session Equipment Utilized During Treatment: Gait belt Activity Tolerance: Patient tolerated treatment well Patient left: with call bell/phone within reach;in bed;with bed alarm set   PT Visit Diagnosis: Unsteadiness on feet (R26.81)    Time: 7412-8786 PT Time Calculation (min) (ACUTE ONLY): 24 min   Charges:   PT Evaluation $PT Eval Low Complexity: 1 Low PT Treatments $Gait Training: 8-22 mins   PT G CodesMagda Kiel, [place] [phone] 01/09/2018   Reginia Naas 01/09/2018, 1:03 PM

## 2018-01-09 NOTE — Progress Notes (Signed)
EEG Completed; Results Pending  
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
## 2018-01-09 NOTE — Progress Notes (Signed)
Pt left without signing paperwork. Unit secretary asked mr Blake [name] to stop in the hall and he refused and kept walking. This nurse was called since she was with another pt. By the time this nurse left the room to get to the hall where mr Blake [name] was last seen, he had already left the floor. This nurse will attempt to call mr Blake [name] later to let him know of the med changes and the dr's f/u appts.

## 2018-01-09 NOTE — Care Management Note (Signed)
Case Management Note  Patient Details  Name: Blake [name] MRN: [id number] Date of Birth: 1941/08/24  Subjective/Objective:     Pt admitted with new onset seizures. He is from home with a friend.               Action/Plan: No f/u per PT. Pt discharging home with self care. Pt has transportation home this pm.   Expected Discharge Date:  01/09/18               Expected Discharge Plan:  Home/Self Care  In-House Referral:     Discharge planning Services     Post Acute Care Choice:    Choice offered to:     DME Arranged:    DME Agency:     HH Arranged:    HH Agency:     Status of Service:  Completed, signed off  If discussed at [employer] of Stay Meetings, dates discussed:    Additional Comments:  Pollie Friar, RN 01/09/2018, 4:12 PM

## 2018-01-09 NOTE — Procedures (Signed)
ELECTROENCEPHALOGRAM REPORT  Date of Study: 01/09/2018  Patient's Name: Blake [name] MRN: [id number] Date of Birth: 08/13/1941  Referring Provider: Erin Hearing, NP  Clinical History: This is a 77 year old man with altered mental status, tremors  Medications: ativan  Technical Summary: A multichannel digital EEG recording measured by the international 10-20 system with electrodes applied with paste and impedances below 5000 ohms performed in our laboratory with EKG monitoring in an awake and drowsy patient.  Hyperventilation was not performed. Photic stimulation was performed.  The digital EEG was referentially recorded, reformatted, and digitally filtered in a variety of bipolar and referential montages for optimal display.    Description: The patient is awake and drowsy during the recording.  During maximal wakefulness, there is a symmetric, medium voltage 8 Hz posterior dominant rhythm that attenuates with eye opening.  There is occasional focal 4-5 Hz theta and 2-3 Hz delta slowing over the right temporoparietal region. During drowsiness, there is an increase in theta slowing of the background. Deeper stages of sleep were not seen. Photic stimulation did not elicit any abnormalities.  There were no epileptiform discharges or electrographic seizures seen.    EKG lead was unremarkable.  Impression: This awake and asleep EEG is abnormal due to occasional focal slowing over the right temporoparietal region.  Clinical Correlation of the above findings indicates focal cerebral dysfunction over the right temporoparietal region suggestive of underlying structural or physiologic abnormality. The absence of epileptiform discharges does not exclude a clinical diagnosis of epilepsy. Clinical correlation is advised.   Ellouise Newer, M.D.

## 2018-01-10 ENCOUNTER — Telehealth: Payer: Self-pay | Admitting: Behavioral Health

## 2018-01-10 NOTE — Telephone Encounter (Signed)
Attempted to reach patient for TCM/Hospital Follow-up call. Unable to leave message at this time; voicemail not available.

## 2018-01-11 NOTE — Telephone Encounter (Signed)
Unable to reach patient at time of TCM Call. Per recording, the patient has a voice mailbox that has not been setup.

## 2018-06-25 ENCOUNTER — Emergency Department (HOSPITAL_COMMUNITY): Payer: Medicare Other

## 2018-06-25 ENCOUNTER — Encounter (HOSPITAL_COMMUNITY): Payer: Self-pay | Admitting: Emergency Medicine

## 2018-06-25 ENCOUNTER — Inpatient Hospital Stay (HOSPITAL_COMMUNITY)
Admission: EM | Admit: 2018-06-25 | Discharge: 2018-06-29 | DRG: 100 | Disposition: A | Discharge status: Skilled Nursing Facility | Payer: Medicare Other | Attending: Internal Medicine | Admitting: Internal Medicine

## 2018-06-25 ENCOUNTER — Other Ambulatory Visit: Payer: Self-pay

## 2018-06-25 DIAGNOSIS — K219 Gastro-esophageal reflux disease without esophagitis: Secondary | ICD-10-CM | POA: Diagnosis present

## 2018-06-25 DIAGNOSIS — Z9841 Cataract extraction status, right eye: Secondary | ICD-10-CM

## 2018-06-25 DIAGNOSIS — R4702 Dysphasia: Secondary | ICD-10-CM | POA: Diagnosis present

## 2018-06-25 DIAGNOSIS — R911 Solitary pulmonary nodule: Secondary | ICD-10-CM | POA: Diagnosis present

## 2018-06-25 DIAGNOSIS — I10 Essential (primary) hypertension: Secondary | ICD-10-CM | POA: Diagnosis present

## 2018-06-25 DIAGNOSIS — F10939 Alcohol use, unspecified with withdrawal, unspecified: Secondary | ICD-10-CM

## 2018-06-25 DIAGNOSIS — Z9842 Cataract extraction status, left eye: Secondary | ICD-10-CM

## 2018-06-25 DIAGNOSIS — Z8673 Personal history of transient ischemic attack (TIA), and cerebral infarction without residual deficits: Secondary | ICD-10-CM

## 2018-06-25 DIAGNOSIS — J449 Chronic obstructive pulmonary disease, unspecified: Secondary | ICD-10-CM | POA: Diagnosis present

## 2018-06-25 DIAGNOSIS — Y900 Blood alcohol level of less than 20 mg/100 ml: Secondary | ICD-10-CM | POA: Diagnosis present

## 2018-06-25 DIAGNOSIS — E785 Hyperlipidemia, unspecified: Secondary | ICD-10-CM | POA: Diagnosis present

## 2018-06-25 DIAGNOSIS — E860 Dehydration: Secondary | ICD-10-CM | POA: Diagnosis present

## 2018-06-25 DIAGNOSIS — K5730 Diverticulosis of large intestine without perforation or abscess without bleeding: Secondary | ICD-10-CM | POA: Diagnosis present

## 2018-06-25 DIAGNOSIS — F10239 Alcohol dependence with withdrawal, unspecified: Secondary | ICD-10-CM

## 2018-06-25 DIAGNOSIS — R410 Disorientation, unspecified: Secondary | ICD-10-CM | POA: Diagnosis not present

## 2018-06-25 DIAGNOSIS — Z7982 Long term (current) use of aspirin: Secondary | ICD-10-CM

## 2018-06-25 DIAGNOSIS — G319 Degenerative disease of nervous system, unspecified: Secondary | ICD-10-CM

## 2018-06-25 DIAGNOSIS — Z6822 Body mass index (BMI) 22.0-22.9, adult: Secondary | ICD-10-CM

## 2018-06-25 DIAGNOSIS — R05 Cough: Secondary | ICD-10-CM | POA: Diagnosis not present

## 2018-06-25 DIAGNOSIS — F10231 Alcohol dependence with withdrawal delirium: Secondary | ICD-10-CM | POA: Diagnosis present

## 2018-06-25 DIAGNOSIS — G4700 Insomnia, unspecified: Secondary | ICD-10-CM

## 2018-06-25 DIAGNOSIS — R4182 Altered mental status, unspecified: Secondary | ICD-10-CM | POA: Diagnosis not present

## 2018-06-25 DIAGNOSIS — Z801 Family history of malignant neoplasm of trachea, bronchus and lung: Secondary | ICD-10-CM

## 2018-06-25 DIAGNOSIS — G4089 Other seizures: Principal | ICD-10-CM | POA: Diagnosis present

## 2018-06-25 DIAGNOSIS — Z9852 Vasectomy status: Secondary | ICD-10-CM

## 2018-06-25 DIAGNOSIS — R569 Unspecified convulsions: Secondary | ICD-10-CM | POA: Diagnosis not present

## 2018-06-25 DIAGNOSIS — D539 Nutritional anemia, unspecified: Secondary | ICD-10-CM | POA: Diagnosis present

## 2018-06-25 DIAGNOSIS — F17210 Nicotine dependence, cigarettes, uncomplicated: Secondary | ICD-10-CM | POA: Diagnosis present

## 2018-06-25 DIAGNOSIS — F17200 Nicotine dependence, unspecified, uncomplicated: Secondary | ICD-10-CM | POA: Diagnosis present

## 2018-06-25 DIAGNOSIS — Z720 Tobacco use: Secondary | ICD-10-CM | POA: Diagnosis present

## 2018-06-25 DIAGNOSIS — E43 Unspecified severe protein-calorie malnutrition: Secondary | ICD-10-CM | POA: Diagnosis present

## 2018-06-25 DIAGNOSIS — R531 Weakness: Secondary | ICD-10-CM | POA: Diagnosis not present

## 2018-06-25 DIAGNOSIS — Z79899 Other long term (current) drug therapy: Secondary | ICD-10-CM

## 2018-06-25 DIAGNOSIS — F5104 Psychophysiologic insomnia: Secondary | ICD-10-CM | POA: Diagnosis present

## 2018-06-25 LAB — RAPID URINE DRUG SCREEN, HOSP PERFORMED
Amphetamines: NOT DETECTED
Barbiturates: NOT DETECTED
Benzodiazepines: NOT DETECTED
Cocaine: NOT DETECTED
Opiates: NOT DETECTED
Tetrahydrocannabinol: NOT DETECTED

## 2018-06-25 LAB — URINALYSIS, ROUTINE W REFLEX MICROSCOPIC
Bilirubin Urine: NEGATIVE
Glucose, UA: NEGATIVE mg/dL
Hgb urine dipstick: NEGATIVE
Ketones, ur: 20 mg/dL — AB
LEUKOCYTES UA: NEGATIVE
NITRITE: NEGATIVE
Protein, ur: NEGATIVE mg/dL
SPECIFIC GRAVITY, URINE: 1.013 (ref 1.005–1.030)
pH: 7 (ref 5.0–8.0)

## 2018-06-25 LAB — COMPREHENSIVE METABOLIC PANEL
ALBUMIN: 3.5 g/dL (ref 3.5–5.0)
ALK PHOS: 60 U/L (ref 38–126)
ALT: 11 U/L (ref 0–44)
AST: 23 U/L (ref 15–41)
Anion gap: 9 (ref 5–15)
BILIRUBIN TOTAL: 0.8 mg/dL (ref 0.3–1.2)
BUN: 7 mg/dL — ABNORMAL LOW (ref 8–23)
CALCIUM: 8.7 mg/dL — AB (ref 8.9–10.3)
CO2: 21 mmol/L — AB (ref 22–32)
Chloride: 108 mmol/L (ref 98–111)
Creatinine, Ser: 0.7 mg/dL (ref 0.61–1.24)
GFR calc Af Amer: >60 mL/min (ref >60)
GFR calc non Af Amer: >60 mL/min (ref >60)
GLUCOSE: 93 mg/dL (ref 70–99)
Potassium: 4.4 mmol/L (ref 3.5–5.1)
SODIUM: 138 mmol/L (ref 135–145)
TOTAL PROTEIN: 5.8 g/dL — AB (ref 6.5–8.1)

## 2018-06-25 LAB — CBC WITH DIFFERENTIAL/PLATELET
ABS IMMATURE GRANULOCYTES: 0.1 10*3/uL (ref 0.0–0.1)
BASOS ABS: 0.1 10*3/uL (ref 0.0–0.1)
BASOS PCT: 1 %
EOS PCT: 1 %
Eosinophils Absolute: 0.1 10*3/uL (ref 0.0–0.7)
HCT: 47.8 % (ref 39.0–52.0)
Hemoglobin: 15.7 g/dL (ref 13.0–17.0)
Immature Granulocytes: 1 %
Lymphocytes Relative: 16 %
Lymphs Abs: 1.2 10*3/uL (ref 0.7–4.0)
MCH: 34.7 pg — AB (ref 26.0–34.0)
MCHC: 32.8 g/dL (ref 30.0–36.0)
MCV: 105.8 fL — ABNORMAL HIGH (ref 78.0–100.0)
MONOS PCT: 7 %
Monocytes Absolute: 0.6 10*3/uL (ref 0.1–1.0)
NEUTROS ABS: 5.7 10*3/uL (ref 1.7–7.7)
Neutrophils Relative %: 74 %
PLATELETS: 214 10*3/uL (ref 150–400)
RBC: 4.52 MIL/uL (ref 4.22–5.81)
RDW: 13.2 % (ref 11.5–15.5)
WBC: 7.6 10*3/uL (ref 4.0–10.5)

## 2018-06-25 LAB — PROTIME-INR
INR: 0.97
Prothrombin Time: 12.8 seconds (ref 11.4–15.2)

## 2018-06-25 LAB — CBG MONITORING, ED: Glucose-Capillary: 97 mg/dL (ref 70–99)

## 2018-06-25 LAB — AMMONIA: Ammonia: 21 umol/L (ref 9–35)

## 2018-06-25 LAB — ETHANOL

## 2018-06-25 MED ORDER — LORAZEPAM 2 MG/ML IJ SOLN
2.0000 mg | INTRAMUSCULAR | Status: DC | PRN
  Administered 2018-06-27: 2 mg via INTRAVENOUS
  Filled 2018-06-25: qty 1

## 2018-06-25 MED ORDER — FOLIC ACID 1 MG PO TABS
1.0000 mg | ORAL_TABLET | Freq: Every day | ORAL | Status: DC
  Administered 2018-06-25 – 2018-06-29 (×4): 1 mg via ORAL
  Filled 2018-06-25 (×5): qty 1

## 2018-06-25 MED ORDER — THIAMINE HCL 100 MG/ML IJ SOLN
100.0000 mg | Freq: Once | INTRAMUSCULAR | Status: AC
  Administered 2018-06-25: 100 mg via INTRAVENOUS
  Filled 2018-06-25: qty 2

## 2018-06-25 MED ORDER — LORAZEPAM 2 MG/ML IJ SOLN
0.0000 mg | Freq: Two times a day (BID) | INTRAMUSCULAR | Status: DC
  Administered 2018-06-29: 2 mg via INTRAVENOUS
  Filled 2018-06-25 (×2): qty 1

## 2018-06-25 MED ORDER — ONDANSETRON HCL 4 MG/2ML IJ SOLN: 4.0000 mg | Freq: Four times a day (QID) | INTRAMUSCULAR | Status: DC | PRN

## 2018-06-25 MED ORDER — SODIUM CHLORIDE 0.9 % IV SOLN
INTRAVENOUS | Status: DC
  Administered 2018-06-25: 22:00:00 via INTRAVENOUS

## 2018-06-25 MED ORDER — LABETALOL HCL 5 MG/ML IV SOLN: 10.0000 mg | INTRAVENOUS | Status: DC | PRN

## 2018-06-25 MED ORDER — ACETAMINOPHEN 325 MG PO TABS: 650.0000 mg | ORAL_TABLET | Freq: Four times a day (QID) | ORAL | Status: DC | PRN

## 2018-06-25 MED ORDER — VITAMIN B-1 100 MG PO TABS
100.0000 mg | ORAL_TABLET | Freq: Every day | ORAL | Status: DC
  Administered 2018-06-25: 100 mg via ORAL
  Filled 2018-06-25 (×3): qty 1

## 2018-06-25 MED ORDER — LEVETIRACETAM IN NACL 1000 MG/100ML IV SOLN
1000.0000 mg | INTRAVENOUS | Status: DC
  Filled 2018-06-25: qty 100

## 2018-06-25 MED ORDER — LORAZEPAM 2 MG/ML IJ SOLN
0.0000 mg | Freq: Four times a day (QID) | INTRAMUSCULAR | Status: AC
  Administered 2018-06-25 – 2018-06-26 (×3): 2 mg via INTRAVENOUS
  Administered 2018-06-26 – 2018-06-27 (×2): 1 mg via INTRAVENOUS
  Filled 2018-06-25 (×4): qty 1

## 2018-06-25 MED ORDER — LORAZEPAM 2 MG/ML IJ SOLN: 1.0000 mg | Freq: Once | INTRAMUSCULAR | Status: DC

## 2018-06-25 MED ORDER — LEVETIRACETAM IN NACL 1000 MG/100ML IV SOLN
1000.0000 mg | Freq: Once | INTRAVENOUS | Status: AC
  Administered 2018-06-25: 1000 mg via INTRAVENOUS
  Filled 2018-06-25: qty 100

## 2018-06-25 MED ORDER — LORAZEPAM 1 MG PO TABS
0.0000 mg | ORAL_TABLET | Freq: Four times a day (QID) | ORAL | Status: AC
  Administered 2018-06-27: 1 mg via ORAL
  Filled 2018-06-25: qty 1

## 2018-06-25 MED ORDER — LORAZEPAM 1 MG PO TABS: 0.0000 mg | ORAL_TABLET | Freq: Two times a day (BID) | ORAL | Status: DC

## 2018-06-25 MED ORDER — ADULT MULTIVITAMIN W/MINERALS CH
1.0000 | ORAL_TABLET | Freq: Every day | ORAL | Status: DC
  Administered 2018-06-25 – 2018-06-29 (×4): 1 via ORAL
  Filled 2018-06-25 (×5): qty 1

## 2018-06-25 MED ORDER — LORAZEPAM 2 MG/ML IJ SOLN
1.0000 mg | Freq: Once | INTRAMUSCULAR | Status: AC
  Administered 2018-06-25: 1 mg via INTRAVENOUS
  Filled 2018-06-25: qty 1

## 2018-06-25 NOTE — ED Notes (Signed)
Admitting at bedside 

## 2018-06-25 NOTE — ED Notes (Signed)
PAGED ADMITTING PER RN JULIE

## 2018-06-25 NOTE — ED Triage Notes (Signed)
Pt arrives to ED from home with complaints of left sided weakness and AMS. Last known normal unknown. EMS reports Pt had a stroke 3 months ago and had no deficits. Pt friend Nicole Kindred called EMS stating he was having left sided weakness and seemed confused today. Pt placed in position of comfort with bed locked and lowered, call bell in reach.

## 2018-06-25 NOTE — ED Notes (Signed)
Patient transported to CT 

## 2018-06-25 NOTE — ED Provider Notes (Signed)
[facility] EMERGENCY DEPARTMENT Provider Note   CSN: [id number] Arrival date & time: 06/25/18  1434     History   Chief Complaint Chief Complaint  Patient presents with  . Altered Mental Status    HPI PERETZ THIEME is a 77 y.o. male.  HPI   Patient is a 77 year old male with history of EtOH abuse, COPD, hypertension, pulmonary nodule, and CVA with reported no residual deficits on prior neurology notes presenting for "weakness".  EMS was called by patient's friend who was unable to be reached, patient reports "I do not know why I am here".  Patient reports that he lives at home with "some friends", and does not receive any assistance with ADLs or medications.  Patient reports drinking daily, approximately a sixpack of beer, and reports that either he will obtain it himself, or his friends will obtain it for him.  Patient denies any alcohol since last night.  Patient denies any seizures, confusion, falls or head trauma recently.  Patient denies any fever, chills, chest pain or shortness of breath.  Patient does report he has a chronic cough, productive, but is not increased above baseline.  Patient denies any abdominal pain, nausea, or vomiting.  Past Medical History:  Diagnosis Date  . Chronic insomnia   . Cigarette smoker   . COPD (chronic obstructive pulmonary disease) ([place])   . Diverticulosis of colon   . Elevated prostate specific antigen (PSA)   . ETOH abuse   . GERD (gastroesophageal reflux disease)   . Hx of colonic polyps   . Hyperlipidemia   . Hypertension   . Pulmonary nodule   . Shortness of breath   . Stroke [facility])     Patient Active Problem List   Diagnosis Date Noted  . New onset seizure ([place]) 01/07/2018  . Hypertension 01/07/2018  . GERD (gastroesophageal reflux disease) 01/07/2018  . Hyperlipidemia 01/07/2018  . Chronic obstructive pulmonary disease (COPD) ([place]) 01/07/2018  . Cigarette smoker 01/07/2018  . Alcohol abuse 01/07/2018  .  Acute alcohol abuse, with delirium ([place])   . Acute encephalopathy 10/14/2014  . Unsteady gait 10/14/2014  . Alcohol withdrawal delirium ([place]) 10/14/2014  . Lipoma of neck 12/22/2012  . Cerebral atrophy 09/28/2012  . Cerebrovascular small vessel disease 09/28/2012  . Left humeral fracture 07/04/2012  . Delirium 07/04/2012  . ETOH abuse   . ELEVATED PROSTATE SPECIFIC ANTIGEN 09/30/2010  . COPD (chronic obstructive pulmonary disease) ([place]) 01/28/2009  . PULMONARY NODULE 01/28/2009  . CIGARETTE SMOKER 02/07/2008  . INSOMNIA, CHRONIC 10/15/2007  . COLONIC POLYPS 10/10/2007  . Type 2 diabetes mellitus ([place]) 10/10/2007  . Dyslipidemia 10/10/2007  . Essential hypertension 10/10/2007  . GERD 10/10/2007  . DIVERTICULOSIS OF COLON 10/10/2007    Past Surgical History:  Procedure Laterality Date  . COLONOSCOPY    . needle biopsy RUL nodule  01/2009   benign  . TONSILLECTOMY  1947  . VASECTOMY          Home Medications    Prior to Admission medications   Medication Sig Start Date End Date Taking? Authorizing Provider  aspirin EC 81 MG tablet Take 1 tablet (81 mg total) by mouth daily. 08/27/16   Nche, Charlene Brooke, NP  cholecalciferol (VITAMIN D) 1000 units tablet Take 1 tablet (1,000 Units total) by mouth daily. 08/27/16   Nche, Charlene Brooke, NP  folic acid (FOLVITE) 1 MG tablet Take 1 tablet (1 mg total) by mouth daily. 08/27/16   Nche, Charlene Brooke, NP  gemfibrozil (LOPID) 600 MG tablet Take 1 tablet (600 mg total) by mouth 2 (two) times daily. 08/27/16   Nche, Charlene Brooke, NP  losartan (COZAAR) 50 MG tablet Take 1 tablet (50 mg total) by mouth daily. 08/27/16   Nche, Charlene Brooke, NP  oxyCODONE (OXY IR/ROXICODONE) 5 MG immediate release tablet Take 1 tablet (5 mg total) by mouth 3 (three) times daily as needed. NOT TO EXCEED 3 PER DAY Patient taking differently: Take 5 mg by mouth 3 (three) times daily as needed for moderate pain. NOT TO EXCEED 3 PER DAY 10/12/13   Noralee Space,  MD  temazepam (RESTORIL) 30 MG capsule Take 1 capsule (30 mg total) by mouth at bedtime as needed for sleep. 08/27/16   Nche, Charlene Brooke, NP  thiamine 100 MG tablet Take 1 tablet (100 mg total) by mouth daily. 08/27/16   Nche, Charlene Brooke, NP    Family History Family History  Problem Relation Age of Onset  . Diabetes Father   . Lung cancer Brother   . Colon cancer Neg Hx   . Esophageal cancer Neg Hx   . Stomach cancer Neg Hx     Social History Social History   Tobacco Use  . Smoking status: Current Every Day Smoker    Packs/day: 1.00    Years: 55.00    Pack years: 55.00    Types: Cigarettes  . Smokeless tobacco: Never Used  Substance Use Topics  . Alcohol use: Yes    Alcohol/week: 24.0 standard drinks    Types: 24 Cans of beer per week    Comment: 12-24 beer daily  . Drug use: No     Allergies   Patient has no known allergies.   Review of Systems Review of Systems  Constitutional: Negative for chills and fever.  Respiratory: Positive for cough.   Cardiovascular: Negative for chest pain.  Gastrointestinal: Negative for abdominal pain, nausea and vomiting.  Allergic/Immunologic: Negative for immunocompromised state.  Neurological: Negative for dizziness, tremors, seizures, weakness, light-headedness and headaches.  All other systems reviewed and are negative.    Physical Exam Updated Vital Signs BP 123/76 (BP Location: Right Arm)   Pulse 67   Temp 98.1 F (36.7 C) (Oral)   Resp 18   SpO2 95%   Physical Exam  Constitutional: He is oriented to person, place, and time. He appears well-developed.  Appears poorly nourished, unkempt.  HENT:  Head: Normocephalic and atraumatic.  Mouth/Throat: Oropharynx is clear and moist.  Eyes: Pupils are equal, round, and reactive to light. Conjunctivae and EOM are normal.  Neck: Normal range of motion. Neck supple.  Cardiovascular: Normal rate, regular rhythm, S1 normal and S2 normal.  No murmur  heard. Pulmonary/Chest: Effort normal. He has wheezes. He has no rales.  Abdominal: Soft. He exhibits no distension. There is no tenderness. There is no guarding.  Musculoskeletal: Normal range of motion. He exhibits no edema or deformity.  Neurological: He is alert and oriented to person, place, and time.  Mental Status:  Alert, oriented, thought content appropriate, able to give a coherent history. Speech fluent without evidence of aphasia. Able to follow 2 step commands without difficulty.  Cranial Nerves:  II:  Peripheral visual fields grossly normal, but patient has difficulty following commands. Right pupil irregular.  Documented on prior exams.  Left pupil pinpoint and minimally reactive. III,IV, VI: ptosis not present, extra-ocular motions intact bilaterally  V,VII: smile symmetric, facial light touch sensation equal VIII: hearing grossly normal to voice  X: uvula elevates symmetrically  XI: bilateral shoulder shrug symmetric and strong XII: midline tongue extension without fassiculations Motor: Patient with 4 out of 5 grip strength in the left upper extremity 5 out of 5 grip strength in the right upper extremity.  Patient is able to lift the left arm against gravity to his ear. Strength 4/5 in bilateral lower extremities. No frank neglect of left side but appearing to pay less attention to it.  Normal tone. Sensory: Pinprick and light touch normal in all extremities.  Deep Tendon Reflexes: 2+ and symmetric in the biceps and patella. No clonus on initial exam.  Cerebellar: normal finger-to-nose with bilateral upper extremities Gait: normal gait and balance Stance: No pronator drift, patient is not elevating his left upper extremity to equal height of his right upper extremity.   CV: distal pulses palpable throughout    Skin: Skin is warm and dry.  Multiple bruises and scratches in bilateral upper and lower extremities.  Psychiatric: He has a normal mood and affect. His behavior is  normal. Judgment and thought content normal.  Nursing note and vitals reviewed.    ED Treatments / Results  Labs (all labs ordered are listed, but only abnormal results are displayed) Labs Reviewed  COMPREHENSIVE METABOLIC PANEL - Abnormal; Notable for the following components:      Result Value   CO2 21 (*)    BUN 7 (*)    Calcium 8.7 (*)    Total Protein 5.8 (*)    All other components within normal limits  CBC WITH DIFFERENTIAL/PLATELET - Abnormal; Notable for the following components:   MCV 105.8 (*)    MCH 34.7 (*)    All other components within normal limits  ETHANOL  PROTIME-INR  AMMONIA  RAPID URINE DRUG SCREEN, HOSP PERFORMED  URINALYSIS, ROUTINE W REFLEX MICROSCOPIC  CBG MONITORING, ED    EKG EKG Interpretation  Date/Time:  Sunday June 25 2018 14:47:01 EDT Ventricular Rate:  69 PR Interval:    QRS Duration: 103 QT Interval:  440 QTC Calculation: 472 R Axis:   -66 Text Interpretation:  Sinus rhythm Prolonged PR interval Left anterior fascicular block Anteroseptal infarct, old No significant change since last tracing Confirmed by Isla Pence [phone]) on 06/25/2018 3:42:42 PM Also confirmed by Isla Pence [phone]), editor Philomena Doheny [phone])  on 06/25/2018 4:23:53 PM   Radiology Dg Chest 2 View  Result Date: 06/25/2018 CLINICAL DATA:  Cough EXAM: CHEST - 2 VIEW COMPARISON:  10/14/2014 FINDINGS: There is no focal parenchymal opacity. There is no pleural effusion or pneumothorax. The heart and mediastinal contours are unremarkable. There is a healed old posttraumatic deformity of left proximal humerus. There is an age-indeterminate L1 vertebral body compression fracture. IMPRESSION: No active cardiopulmonary disease. Electronically Signed   By: Kathreen Devoid   On: 06/25/2018 16:31   Ct Head Wo Contrast  Result Date: 06/25/2018 CLINICAL DATA:  Left-sided weakness and altered mental status EXAM: CT HEAD WITHOUT CONTRAST TECHNIQUE: Contiguous axial images  were obtained from the base of the skull through the vertex without intravenous contrast. COMPARISON:  January 07, 2018 FINDINGS: Brain: Moderate diffuse atrophy is stable. There is no intracranial mass, hemorrhage, extra-axial fluid collection, or midline shift. There is patchy small vessel disease in the centra semiovale bilaterally. No new brain parenchymal lesions evident. No acute infarct is appreciable. Vascular: No hyperdense vessels evident. There is calcification in each carotid siphon region. Skull: Bony calvarium appears intact. Sinuses/Orbits: There is a small retention cyst in the  inferomedial left maxillary antrum. There is mucosal thickening in several ethmoid air cells bilaterally. Postoperative changes are noted in the left lateral orbital wall and anterior left maxillary antrum. Orbits appear symmetric bilaterally. There is evidence of previous cataract removals bilaterally. Other: Mastoid air cells are clear. There is debris in each external auditory canal. IMPRESSION: Atrophy with patchy periventricular small vessel disease. No evident acute infarct. No mass or hemorrhage. There are foci of arterial vascular calcification. Foci of paranasal sinus disease noted with areas of postoperative change in the left facial region. There is probable cerumen in each external auditory canal. Electronically Signed   By: Lowella Grip III M.D.   On: 06/25/2018 16:01    Procedures Procedures (including critical care time)  Medications Ordered in ED Medications - No data to display   Initial Impression / Assessment and Plan / ED Course  I have reviewed the triage vital signs and the nursing notes.  Pertinent labs & imaging results that were available during my care of the patient were reviewed by me and considered in my medical decision making (see chart for details).  Clinical Course as of Jun 25 1750  Sun Jun 25, 2018  1530 Attempted to call a friend, Ovid Curd.  Unable to reach.  Last known  normal is unknown. Patient appears to have progressive ability to use LUE and LLE.  Differential diagnosis includes CVA versus Todd's paralysis.   [AM]  2706 I was called to bedside evaluate patient.  He appeared to be having some left-sided tremoring.  On my evaluation, patient appeared to be tremoring left side of face, left arm, and most prominently in the left lower extremity.  Fasciculations ceased after 1 mg of intravenous Ativan.  Consistent with focal partial seizure.  Patient vomited, but airway remained intact.    [AM]  1733 Spoke with Dr. Cheral Marker of neurology who previously consult on this patient.  States that history and presentation appear very consistent with alcohol withdrawal seizures.  We will proceed with current management.  I appreciate his involvement in the care of this patient.   [AM]  40 Spoke with Dr. Nevada Crane of [facility] who will admit the patient.    [AM]    Clinical Course User Index [AM] Albesa Seen, PA-C    I my initial examination.  Patient is nontoxic-appearing, in no acute distress, but does appear to be partially neglecting the left side, and has rapidly improving weakness of the left upper and left lower extremity.  It is also documented in the medical record that he has previous unequal pupils.  On review of previous notes, this was a similar presentation to prior focal partial seizure in April 2019.  Multiple attempts were made to contact patient's friend to determine last known normal.  Work-up is significant for no acute changes in his noncontrast CT, and age-indeterminate L1 compression fracture on chest x-ray, but no cardia pulmonary disease on chest x-ray.  Patient has no hyponatremia or other electrolyte ab normalities.  Patient does have slight hypercalcemia and hyperproteinemia.  Alcohol level is undetectable, which further supports alcohol withdrawal seizures.  Patient is started on CIWA protocol, and will admit for alcohol withdrawal  seizure. Patient did note to me that there was no particular reason that he did not have alcohol today, and it was not in an attempt to self discontinue alcohol use.  This is a shared visit with Dr. Isla Pence. Patient was independently evaluated by this attending physician. Attending physician consulted  in evaluation and disposition management.  Final Clinical Impressions(s) / ED Diagnoses   Final diagnoses:  Seizure ([place])  Alcohol withdrawal syndrome with complication [facility])    ED Discharge Orders    None       Tamala Julian 06/25/18 1752    Isla Pence, MD 06/25/18 1840

## 2018-06-25 NOTE — H&P (Signed)
History and Physical  Blake [name] DJM:[id number] DOB: [birth year] DOA: 06/25/2018  Referring physician: Dr Valere Dross  PCP: Flossie Buffy, NP  Outpatient Specialists: None Patient coming from: Home  Chief Complaint: Shaking  HPI: Blake [name] is a 77 y.o. male with medical history significant for COPD, hypertension, pulmonary nodule, alcohol abuse, alcohol withdrawal induced seizures who presented to [facility] ED from home where he lives with friends due to uncontrollable left-sided shaking movements that started this morning.  History is obtained from ED, medical records, and partially from the patient.  Patient's friend called EMS and reported that patient had been drinking at least 6 packs of beer per day for the past week and appears that since yesterday had lack of access to alcohol.  Patient does not appear to be interested in alcohol cessation.  Had a prior admission in April with similar presentation in which he was treated for alcohol withdrawal induced seizures.  Vomited once in the ED.  [facility] asked to admit.  ED Course: CT head unremarkable; CIWA protocol started.  IV Ativan given with improvement.  Chest x-ray unremarkable for any acute cardiopulmonary findings.  Review of Systems: Review of systems as noted in the HPI. All other systems reviewed and are negative.   Past Medical History:  Diagnosis Date  . Chronic insomnia   . Cigarette smoker   . COPD (chronic obstructive pulmonary disease) ([place])   . Diverticulosis of colon   . Elevated prostate specific antigen (PSA)   . ETOH abuse   . GERD (gastroesophageal reflux disease)   . Hx of colonic polyps   . Hyperlipidemia   . Hypertension   . Pulmonary nodule   . Shortness of breath   . Stroke [facility])    Past Surgical History:  Procedure Laterality Date  . COLONOSCOPY    . needle biopsy RUL nodule  01/2009   benign  . TONSILLECTOMY  1947  . VASECTOMY      Social History:  reports that he has been smoking cigarettes. He  has a 55.00 pack-year smoking history. He has never used smokeless tobacco. He reports that he drinks about 24.0 standard drinks of alcohol per week. He reports that he does not use drugs.   No Known Allergies  Family History  Problem Relation Age of Onset  . Diabetes Father   . Lung cancer Brother   . Colon cancer Neg Hx   . Esophageal cancer Neg Hx   . Stomach cancer Neg Hx     Father had diabetes.  Prior to Admission medications   Medication Sig Start Date End Date Taking? Authorizing Provider  aspirin EC 81 MG tablet Take 1 tablet (81 mg total) by mouth daily. 08/27/16   Nche, Charlene Brooke, NP  cholecalciferol (VITAMIN D) 1000 units tablet Take 1 tablet (1,000 Units total) by mouth daily. 08/27/16   Nche, Charlene Brooke, NP  folic acid (FOLVITE) 1 MG tablet Take 1 tablet (1 mg total) by mouth daily. 08/27/16   Nche, Charlene Brooke, NP  gemfibrozil (LOPID) 600 MG tablet Take 1 tablet (600 mg total) by mouth 2 (two) times daily. 08/27/16   Nche, Charlene Brooke, NP  losartan (COZAAR) 50 MG tablet Take 1 tablet (50 mg total) by mouth daily. 08/27/16   Nche, Charlene Brooke, NP  oxyCODONE (OXY IR/ROXICODONE) 5 MG immediate release tablet Take 1 tablet (5 mg total) by mouth 3 (three) times daily as needed. NOT TO EXCEED 3 PER DAY Patient taking differently: Take 5 mg  by mouth 3 (three) times daily as needed for moderate pain. NOT TO EXCEED 3 PER DAY 10/12/13   Noralee Space, MD  temazepam (RESTORIL) 30 MG capsule Take 1 capsule (30 mg total) by mouth at bedtime as needed for sleep. 08/27/16   Nche, Charlene Brooke, NP  thiamine 100 MG tablet Take 1 tablet (100 mg total) by mouth daily. 08/27/16   Nche, Charlene Brooke, NP    Physical Exam: BP (!) 133/108   Pulse (!) 105   Temp 98.1 F (36.7 C) (Oral)   Resp (!) 24   SpO2 98%   . General: 77 y.o. year-old male well developed well nourished in no acute distress.  Alert and minimally interactive. . Cardiovascular: Regular rate and rhythm with no  rubs or gallops.  No thyromegaly or JVD noted.  No lower extremity edema. 2/4 pulses in all 4 extremities. Marland Kitchen Respiratory: Clear to auscultation with no wheezes or rales. Good inspiratory effort. . Abdomen: Soft nontender nondistended with normal bowel sounds x4 quadrants. . Muskuloskeletal: No cyanosis, clubbing or edema noted bilaterally . Neuro: CN II-XII intact, strength, sensation, reflexes; right pupil larger than left.  Patient states he had a prior right eye surgery. . Skin: No ulcerative lesions noted or rashes. . Psychiatry: Judgement and insight altered. Mood is appropriate for condition and setting          Labs on Admission:  Basic Metabolic Panel: Recent Labs  Lab 06/25/18 1537  NA 138  K 4.4  CL 108  CO2 21*  GLUCOSE 93  BUN 7*  CREATININE 0.70  CALCIUM 8.7*   Liver Function Tests: Recent Labs  Lab 06/25/18 1537  AST 23  ALT 11  ALKPHOS 60  BILITOT 0.8  PROT 5.8*  ALBUMIN 3.5   No results for input(s): LIPASE, AMYLASE in the last 168 hours. Recent Labs  Lab 06/25/18 1537  AMMONIA 21   CBC: Recent Labs  Lab 06/25/18 1537  WBC 7.6  NEUTROABS 5.7  HGB 15.7  HCT 47.8  MCV 105.8*  PLT 214   Cardiac Enzymes: No results for input(s): CKTOTAL, CKMB, CKMBINDEX, TROPONINI in the last 168 hours.  BNP (last 3 results) No results for input(s): BNP in the last 8760 hours.  ProBNP (last 3 results) No results for input(s): PROBNP in the last 8760 hours.  CBG: Recent Labs  Lab 06/25/18 1450  GLUCAP 97    Radiological Exams on Admission: Dg Chest 2 View  Result Date: 06/25/2018 CLINICAL DATA:  Cough EXAM: CHEST - 2 VIEW COMPARISON:  10/14/2014 FINDINGS: There is no focal parenchymal opacity. There is no pleural effusion or pneumothorax. The heart and mediastinal contours are unremarkable. There is a healed old posttraumatic deformity of left proximal humerus. There is an age-indeterminate L1 vertebral body compression fracture. IMPRESSION: No active  cardiopulmonary disease. Electronically Signed   By: Kathreen Devoid   On: 06/25/2018 16:31   Ct Head Wo Contrast  Result Date: 06/25/2018 CLINICAL DATA:  Left-sided weakness and altered mental status EXAM: CT HEAD WITHOUT CONTRAST TECHNIQUE: Contiguous axial images were obtained from the base of the skull through the vertex without intravenous contrast. COMPARISON:  January 07, 2018 FINDINGS: Brain: Moderate diffuse atrophy is stable. There is no intracranial mass, hemorrhage, extra-axial fluid collection, or midline shift. There is patchy small vessel disease in the centra semiovale bilaterally. No new brain parenchymal lesions evident. No acute infarct is appreciable. Vascular: No hyperdense vessels evident. There is calcification in each carotid siphon region. Skull:  Bony calvarium appears intact. Sinuses/Orbits: There is a small retention cyst in the inferomedial left maxillary antrum. There is mucosal thickening in several ethmoid air cells bilaterally. Postoperative changes are noted in the left lateral orbital wall and anterior left maxillary antrum. Orbits appear symmetric bilaterally. There is evidence of previous cataract removals bilaterally. Other: Mastoid air cells are clear. There is debris in each external auditory canal. IMPRESSION: Atrophy with patchy periventricular small vessel disease. No evident acute infarct. No mass or hemorrhage. There are foci of arterial vascular calcification. Foci of paranasal sinus disease noted with areas of postoperative change in the left facial region. There is probable cerumen in each external auditory canal. Electronically Signed   By: Lowella Grip III M.D.   On: 06/25/2018 16:01    EKG: I independently viewed the EKG done and my findings are as followed: Sinus rhythm with rate of 69 no acute or specific ST-T changes.  Assessment/Plan Present on Admission: **None**  Active Problems:   Alcohol withdrawal seizure ([place])  Suspected alcohol withdrawal  induced seizures Alcohol level less than 10 IV Keppra started in the ED, Continue CIWA protocol initiated, continue Swallow evaluation at bedside Multivitamins including folate and thiamine Fall precautions  Nausea/vomiting Suspect secondary to alcohol withdrawal IV Zofran as needed for nausea IV Ativan per CIWA protocol  Macrocytic anemia Supplement with folate and B12 vitamins Hemoglobin stable at 15.7 MCV 105.8 Obtain CBC in the morning  Moderate protein calorie malnutrition Albumin 3.5 Oral supplement when able to swallow safely  Dehydration IV fluid hydration with normal saline Monitor electrolytes Obtain BMP in the morning  Hypertension/sinus tachycardia Suspect exacerbated by alcohol withdrawal Treat with clonidine as part of CIWA protocol Continue to monitor vital signs      DVT prophylaxis: Subcu Lovenox daily  Code Status: Full code  Family Communication: None at bedside  Disposition Plan: Admit to telemetry  Consults called: ED physician curb sided with neurology  Admission status: Observation    Kayleen Memos MD [facility] Pager [phone]  If 7PM-7AM, please contact night-coverage [email or web address] Password [facility]  06/25/2018, 6:34 PM

## 2018-06-26 ENCOUNTER — Inpatient Hospital Stay (HOSPITAL_COMMUNITY): Payer: Medicare Other

## 2018-06-26 ENCOUNTER — Encounter (HOSPITAL_COMMUNITY): Payer: Self-pay

## 2018-06-26 DIAGNOSIS — F10231 Alcohol dependence with withdrawal delirium: Secondary | ICD-10-CM | POA: Diagnosis present

## 2018-06-26 DIAGNOSIS — E43 Unspecified severe protein-calorie malnutrition: Secondary | ICD-10-CM

## 2018-06-26 DIAGNOSIS — K5730 Diverticulosis of large intestine without perforation or abscess without bleeding: Secondary | ICD-10-CM | POA: Diagnosis present

## 2018-06-26 DIAGNOSIS — E860 Dehydration: Secondary | ICD-10-CM | POA: Diagnosis present

## 2018-06-26 DIAGNOSIS — R569 Unspecified convulsions: Secondary | ICD-10-CM

## 2018-06-26 DIAGNOSIS — R05 Cough: Secondary | ICD-10-CM | POA: Diagnosis not present

## 2018-06-26 DIAGNOSIS — Z7401 Bed confinement status: Secondary | ICD-10-CM | POA: Diagnosis not present

## 2018-06-26 DIAGNOSIS — R911 Solitary pulmonary nodule: Secondary | ICD-10-CM | POA: Diagnosis present

## 2018-06-26 DIAGNOSIS — E785 Hyperlipidemia, unspecified: Secondary | ICD-10-CM | POA: Diagnosis present

## 2018-06-26 DIAGNOSIS — Z9841 Cataract extraction status, right eye: Secondary | ICD-10-CM | POA: Diagnosis not present

## 2018-06-26 DIAGNOSIS — Y900 Blood alcohol level of less than 20 mg/100 ml: Secondary | ICD-10-CM | POA: Diagnosis present

## 2018-06-26 DIAGNOSIS — G319 Degenerative disease of nervous system, unspecified: Secondary | ICD-10-CM | POA: Diagnosis not present

## 2018-06-26 DIAGNOSIS — E7800 Pure hypercholesterolemia, unspecified: Secondary | ICD-10-CM | POA: Diagnosis not present

## 2018-06-26 DIAGNOSIS — Z8673 Personal history of transient ischemic attack (TIA), and cerebral infarction without residual deficits: Secondary | ICD-10-CM | POA: Diagnosis not present

## 2018-06-26 DIAGNOSIS — R4182 Altered mental status, unspecified: Secondary | ICD-10-CM | POA: Diagnosis not present

## 2018-06-26 DIAGNOSIS — G4089 Other seizures: Secondary | ICD-10-CM | POA: Diagnosis present

## 2018-06-26 DIAGNOSIS — Z9842 Cataract extraction status, left eye: Secondary | ICD-10-CM | POA: Diagnosis not present

## 2018-06-26 DIAGNOSIS — J449 Chronic obstructive pulmonary disease, unspecified: Secondary | ICD-10-CM | POA: Diagnosis present

## 2018-06-26 DIAGNOSIS — Z6822 Body mass index (BMI) 22.0-22.9, adult: Secondary | ICD-10-CM | POA: Diagnosis not present

## 2018-06-26 DIAGNOSIS — F10239 Alcohol dependence with withdrawal, unspecified: Secondary | ICD-10-CM | POA: Diagnosis not present

## 2018-06-26 DIAGNOSIS — Z9852 Vasectomy status: Secondary | ICD-10-CM | POA: Diagnosis not present

## 2018-06-26 DIAGNOSIS — I10 Essential (primary) hypertension: Secondary | ICD-10-CM | POA: Diagnosis not present

## 2018-06-26 DIAGNOSIS — M2550 Pain in unspecified joint: Secondary | ICD-10-CM | POA: Diagnosis not present

## 2018-06-26 DIAGNOSIS — R531 Weakness: Secondary | ICD-10-CM | POA: Diagnosis not present

## 2018-06-26 DIAGNOSIS — Z801 Family history of malignant neoplasm of trachea, bronchus and lung: Secondary | ICD-10-CM | POA: Diagnosis not present

## 2018-06-26 DIAGNOSIS — Z79899 Other long term (current) drug therapy: Secondary | ICD-10-CM | POA: Diagnosis not present

## 2018-06-26 DIAGNOSIS — F5104 Psychophysiologic insomnia: Secondary | ICD-10-CM | POA: Diagnosis present

## 2018-06-26 DIAGNOSIS — Z7982 Long term (current) use of aspirin: Secondary | ICD-10-CM | POA: Diagnosis not present

## 2018-06-26 DIAGNOSIS — F17210 Nicotine dependence, cigarettes, uncomplicated: Secondary | ICD-10-CM | POA: Diagnosis present

## 2018-06-26 DIAGNOSIS — R4702 Dysphasia: Secondary | ICD-10-CM | POA: Diagnosis present

## 2018-06-26 DIAGNOSIS — D539 Nutritional anemia, unspecified: Secondary | ICD-10-CM | POA: Diagnosis present

## 2018-06-26 DIAGNOSIS — K219 Gastro-esophageal reflux disease without esophagitis: Secondary | ICD-10-CM | POA: Diagnosis present

## 2018-06-26 LAB — COMPREHENSIVE METABOLIC PANEL
ALT: 11 U/L (ref 0–44)
AST: 21 U/L (ref 15–41)
Albumin: 3.3 g/dL — ABNORMAL LOW (ref 3.5–5.0)
Alkaline Phosphatase: 54 U/L (ref 38–126)
Anion gap: 7 (ref 5–15)
BILIRUBIN TOTAL: 1.2 mg/dL (ref 0.3–1.2)
BUN: 7 mg/dL — AB (ref 8–23)
CHLORIDE: 108 mmol/L (ref 98–111)
CO2: 23 mmol/L (ref 22–32)
Calcium: 8.5 mg/dL — ABNORMAL LOW (ref 8.9–10.3)
Creatinine, Ser: 0.83 mg/dL (ref 0.61–1.24)
GFR calc Af Amer: >60 mL/min (ref >60)
Glucose, Bld: 89 mg/dL (ref 70–99)
POTASSIUM: 3.8 mmol/L (ref 3.5–5.1)
Sodium: 138 mmol/L (ref 135–145)
TOTAL PROTEIN: 5.4 g/dL — AB (ref 6.5–8.1)

## 2018-06-26 LAB — CBC
HCT: 44 % (ref 39.0–52.0)
HEMOGLOBIN: 14.5 g/dL (ref 13.0–17.0)
MCH: 34.7 pg — ABNORMAL HIGH (ref 26.0–34.0)
MCHC: 33 g/dL (ref 30.0–36.0)
MCV: 105.3 fL — ABNORMAL HIGH (ref 78.0–100.0)
PLATELETS: 218 10*3/uL (ref 150–400)
RBC: 4.18 MIL/uL — AB (ref 4.22–5.81)
RDW: 13.2 % (ref 11.5–15.5)
WBC: 10.1 10*3/uL (ref 4.0–10.5)

## 2018-06-26 LAB — MAGNESIUM: MAGNESIUM: 2 mg/dL (ref 1.7–2.4)

## 2018-06-26 LAB — PHOSPHORUS: Phosphorus: 3.2 mg/dL (ref 2.5–4.6)

## 2018-06-26 MED ORDER — VITAMIN B-1 100 MG PO TABS: 100.0000 mg | ORAL_TABLET | Freq: Every day | ORAL | Status: DC

## 2018-06-26 MED ORDER — LEVETIRACETAM IN NACL 500 MG/100ML IV SOLN
500.0000 mg | Freq: Two times a day (BID) | INTRAVENOUS | Status: DC
  Administered 2018-06-26 – 2018-06-27 (×2): 500 mg via INTRAVENOUS
  Filled 2018-06-26 (×2): qty 100

## 2018-06-26 MED ORDER — CHLORDIAZEPOXIDE HCL 25 MG PO CAPS
25.0000 mg | ORAL_CAPSULE | Freq: Three times a day (TID) | ORAL | Status: DC
  Administered 2018-06-26 – 2018-06-27 (×5): 25 mg via ORAL
  Filled 2018-06-26 (×5): qty 1

## 2018-06-26 MED ORDER — GEMFIBROZIL 600 MG PO TABS
600.0000 mg | ORAL_TABLET | Freq: Two times a day (BID) | ORAL | Status: DC
  Administered 2018-06-26 – 2018-06-29 (×6): 600 mg via ORAL
  Filled 2018-06-26 (×7): qty 1

## 2018-06-26 MED ORDER — HEPARIN SODIUM (PORCINE) 5000 UNIT/ML IJ SOLN
5000.0000 [IU] | Freq: Three times a day (TID) | INTRAMUSCULAR | Status: DC
  Administered 2018-06-26 – 2018-06-29 (×8): 5000 [IU] via SUBCUTANEOUS
  Filled 2018-06-26 (×8): qty 1

## 2018-06-26 MED ORDER — SODIUM CHLORIDE 0.9 % IV SOLN
INTRAVENOUS | Status: AC
  Administered 2018-06-26: 12:00:00 via INTRAVENOUS

## 2018-06-26 MED ORDER — ASPIRIN EC 81 MG PO TBEC
81.0000 mg | DELAYED_RELEASE_TABLET | Freq: Every day | ORAL | Status: DC
  Administered 2018-06-27 – 2018-06-29 (×3): 81 mg via ORAL
  Filled 2018-06-26 (×3): qty 1

## 2018-06-26 MED ORDER — VITAMIN B-1 100 MG PO TABS
500.0000 mg | ORAL_TABLET | Freq: Three times a day (TID) | ORAL | Status: DC
  Administered 2018-06-26: 500 mg via ORAL
  Filled 2018-06-26: qty 5

## 2018-06-26 MED ORDER — ENSURE ENLIVE PO LIQD
237.0000 mL | Freq: Two times a day (BID) | ORAL | Status: DC
  Administered 2018-06-26 – 2018-06-29 (×5): 237 mL via ORAL

## 2018-06-26 MED ORDER — CLONIDINE HCL 0.2 MG PO TABS: 0.2000 mg | ORAL_TABLET | Freq: Three times a day (TID) | ORAL | Status: DC | PRN

## 2018-06-26 NOTE — Procedures (Signed)
ELECTROENCEPHALOGRAM REPORT   Patient: Blake [name]       Room #: 0K93G EEG No. ID: [id number] Age: 77 y.o.        Sex: male Referring Physician: Candiss Norse Report Date:  06/26/2018        Interpreting Physician: Alexis Goodell  History: [name] is an 77 y.o. male with possible seizure  Medications:  ASA, Librium, Folvite, Keppra, MVI, Thiamine  Conditions of Recording:  This is a 21 channel routine scalp EEG performed with bipolar and monopolar montages arranged in accordance to the international 10/20 system of electrode placement. One channel was dedicated to EKG recording.  The patient is in the awake and drowsy states.  Description:  During wakefulness muscle and movement artifact are prominent during the recording.  This artifact often obscures the background.  When able to be evaluated the posterior background rhythm does reach an 8 Hz alpha.  The background activity when able to be evaluated is often slower though and appears to be characteristic of drowse with slowing to irregular, low voltage theta and beta activity.   Stage II sleep is not obtained. No epileptiform activity is noted.   Hyperventilation and intermittent photic stimulation were not performed   IMPRESSION: This EEG is characterized by slowing which is consistent with normal drowse.  Can not rule out the possibility of slowing related to general cerebral disturbance such as a metabolic encephalopathy.  Clinical correlation recommended.  No epileptiform activity is noted.     COMMENT: Muscle and movement artifact present may obscure more subtle findings.   Alexis Goodell, MD Neurology [phone] 06/26/2018, 6:24 PM

## 2018-06-26 NOTE — Progress Notes (Signed)
@IPLOG @        PROGRESS NOTE                                                                                                                                                                                                             Patient Demographics:    Blake [name], is a 77 y.o. male, DOB - 1941-07-09, NKN:[id number]  Admit date - 06/25/2018   Admitting Physician Kayleen Memos, DO  Outpatient Primary MD for the patient is Nche, Charlene Brooke, NP  LOS - 0  Chief Complaint  Patient presents with  . Altered Mental Status       Brief Narrative  Blake [name] is a 77 y.o. male with medical history significant for COPD, hypertension, pulmonary nodule, alcohol abuse, alcohol withdrawal induced seizures who presented to [facility] ED from home where he lives with friends due to uncontrollable left-sided shaking movements that started the morning of the day of admission.  Apparently he drinks about 6 to 12 bottles of beer daily and skipped alcoholic for a day, he does not recall why in the ER he was admitted with a running diagnosis of focal seizure mostly affecting his left side, CT head was unremarkable, neurologist Dr. Cheral Marker was consulted over the phone who recommended that his presentation was typical of his previous presentation not too long ago.  He was admitted for alcohol withdrawal seizures.   Subjective:    [name] today has, No headache, No chest pain, No abdominal pain - No Nausea, No new weakness tingling or numbness, No Cough - SOB.    Assessment  & Plan :     1.  Alcohol withdrawal seizure.  Supportive care, head CT unremarkable, neurology consulted over the phone at the time of admission no further work-up per neurology, continue to monitor.  2.  History of heavy alcohol abuse and smoking.  Counseled to quit all, placed on Librium along with CIWA protocol.  Monitor for DTs.  3.  Hypertension.  For now PRN Catapres.  4.  Dyslipidemia.  Placed on home dose  statin.    Family Communication  :  None  Code Status :  Full  Disposition Plan  :  Med - surg  Consults  : Neurologist Dr. Cheral Marker was consulted over the phone by the admitting physician.  Procedures  :    CT head.  Nonacute.  DVT Prophylaxis  :  Heparin   Lab Results  Component Value Date   PLT 218 06/26/2018    Diet :  Diet Order            Diet regular Room service appropriate? Yes; Fluid consistency: Thin  Diet effective now               Inpatient Medications Scheduled Meds: . aspirin EC  81 mg Oral Daily  . chlordiazePOXIDE  25 mg Oral TID  . folic acid  1 mg Oral Daily  . gemfibrozil  600 mg Oral BID  . LORazepam  0-4 mg Intravenous Q6H   Or  . LORazepam  0-4 mg Oral Q6H  . [START ON 06/28/2018] LORazepam  0-4 mg Intravenous Q12H   Or  . [START ON 06/28/2018] LORazepam  0-4 mg Oral Q12H  . multivitamin with minerals  1 tablet Oral Daily  . thiamine  100 mg Oral Daily   Continuous Infusions: . sodium chloride 75 mL/hr at 06/25/18 2224  . levETIRAcetam     PRN Meds:.acetaminophen, cloNIDine, LORazepam, ondansetron (ZOFRAN) IV  Antibiotics  :   Anti-infectives (From admission, onward)   None        Objective:   Vitals:   06/25/18 1945 06/25/18 2000 06/25/18 2111 06/26/18 0454  BP: (!) 171/97 (!) 175/92 (!) 162/84 (!) 119/58  Pulse: 73 80 81 64  Resp: (!) 21 (!) 21    Temp:   99.5 F (37.5 C) 99.8 F (37.7 C)  TempSrc:   Oral Oral  SpO2: 97% 99% 95% 94%    Wt Readings from Last 3 Encounters:  01/08/18 67.7 kg  08/27/16 68.5 kg  10/16/14 69.6 kg     Intake/Output Summary (Last 24 hours) at 06/26/2018 1052 Last data filed at 06/26/2018 0500 Gross per 24 hour  Intake 525 ml  Output -  Net 525 ml     Physical Exam  Awake , mildly confused, No new F.N deficits, Normal affect [place].AT,PERRAL Supple Neck,No JVD, No cervical lymphadenopathy appriciated.  Symmetrical Chest wall movement, Good air movement bilaterally, CTAB RRR,No  Gallops,Rubs or new Murmurs, No Parasternal Heave +ve B.Sounds, Abd Soft, No tenderness, No organomegaly appriciated, No rebound - guarding or rigidity. No Cyanosis, Clubbing or edema, No new Rash or bruise      Data Review:    CBC Recent Labs  Lab 06/25/18 1537 06/26/18 0521  WBC 7.6 10.1  HGB 15.7 14.5  HCT 47.8 44.0  PLT 214 218  MCV 105.8* 105.3*  MCH 34.7* 34.7*  MCHC 32.8 33.0  RDW 13.2 13.2  LYMPHSABS 1.2  --   MONOABS 0.6  --   EOSABS 0.1  --   BASOSABS 0.1  --     Chemistries  Recent Labs  Lab 06/25/18 1537 06/26/18 0521  NA 138 138  K 4.4 3.8  CL 108 108  CO2 21* 23  GLUCOSE 93 89  BUN 7* 7*  CREATININE 0.70 0.83  CALCIUM 8.7* 8.5*  MG  --  2.0  AST 23 21  ALT 11 11  ALKPHOS 60 54  BILITOT 0.8 1.2   ------------------------------------------------------------------------------------------------------------------ No results for input(s): CHOL, HDL, LDLCALC, TRIG, CHOLHDL, LDLDIRECT in the last 72 hours.  Lab Results  Component Value Date   HGBA1C 4.5 (L) 08/27/2016   ------------------------------------------------------------------------------------------------------------------ No results for input(s): TSH, T4TOTAL, T3FREE, THYROIDAB in the last 72 hours.  Invalid input(s): FREET3 ------------------------------------------------------------------------------------------------------------------ No results for input(s): VITAMINB12, FOLATE, FERRITIN, TIBC, IRON, RETICCTPCT in the last 72 hours.  Coagulation profile Recent Labs  Lab 06/25/18 1537  INR 0.97    No results for input(s): DDIMER in  the last 72 hours.  Cardiac Enzymes No results for input(s): CKMB, TROPONINI, MYOGLOBIN in the last 168 hours.  Invalid input(s): CK ------------------------------------------------------------------------------------------------------------------ No results found for: BNP  Micro Results No results found for this or any previous visit (from the  past 240 hour(s)).  Radiology Reports Dg Chest 2 View  Result Date: 06/25/2018 CLINICAL DATA:  Cough EXAM: CHEST - 2 VIEW COMPARISON:  10/14/2014 FINDINGS: There is no focal parenchymal opacity. There is no pleural effusion or pneumothorax. The heart and mediastinal contours are unremarkable. There is a healed old posttraumatic deformity of left proximal humerus. There is an age-indeterminate L1 vertebral body compression fracture. IMPRESSION: No active cardiopulmonary disease. Electronically Signed   By: Kathreen Devoid   On: 06/25/2018 16:31   Ct Head Wo Contrast  Result Date: 06/25/2018 CLINICAL DATA:  Left-sided weakness and altered mental status EXAM: CT HEAD WITHOUT CONTRAST TECHNIQUE: Contiguous axial images were obtained from the base of the skull through the vertex without intravenous contrast. COMPARISON:  January 07, 2018 FINDINGS: Brain: Moderate diffuse atrophy is stable. There is no intracranial mass, hemorrhage, extra-axial fluid collection, or midline shift. There is patchy small vessel disease in the centra semiovale bilaterally. No new brain parenchymal lesions evident. No acute infarct is appreciable. Vascular: No hyperdense vessels evident. There is calcification in each carotid siphon region. Skull: Bony calvarium appears intact. Sinuses/Orbits: There is a small retention cyst in the inferomedial left maxillary antrum. There is mucosal thickening in several ethmoid air cells bilaterally. Postoperative changes are noted in the left lateral orbital wall and anterior left maxillary antrum. Orbits appear symmetric bilaterally. There is evidence of previous cataract removals bilaterally. Other: Mastoid air cells are clear. There is debris in each external auditory canal. IMPRESSION: Atrophy with patchy periventricular small vessel disease. No evident acute infarct. No mass or hemorrhage. There are foci of arterial vascular calcification. Foci of paranasal sinus disease noted with areas of  postoperative change in the left facial region. There is probable cerumen in each external auditory canal. Electronically Signed   By: Lowella Grip III M.D.   On: 06/25/2018 16:01    Time Spent in minutes  30   Lala Lund M.D on 06/26/2018 at 10:52 AM  To page go to [email or web address] - password [facility]

## 2018-06-26 NOTE — Consult Note (Addendum)
Neurology Consultation  Reason for Consult: Possible seizure Referring Physician: Candiss Norse  CC: Left hand abnormal movements  History is obtained from: Patient  HPI: Blake [name] is a 77 y.o. male stroke, hypertension, hyperlipidemia, alcohol abuse.  Upon consult it is clearly noted that patient is a poor historian.  What I could get out of him is that he woke up around 230 this a.m. and noted that his left hand was opening and closing rapidly.  He cannot recall if he was able to stop the opening and closing.  He then stated that he fell asleep and had a weird dream.  He states that his roommates noted that he had a seizure however he does not recall it.  I did try to call both people that were on his list of contacts but nobody answered.  Currently he is not seizing.  Patient states that he does not see or has not had a seizure in the past.  Patient does drink 12-24 beers daily however the seizure that he describes to me is a simple partial seizure and not tonic-clonic however I am not clear what his roommates had visualized.  I am not clear due to him being a poor historian if this was a simple partial which became tonic-clonic.  ROS: A 14 point ROS was performed and is negative except as noted in the HPI.   Past Medical History:  Diagnosis Date  . Chronic insomnia   . Cigarette smoker   . COPD (chronic obstructive pulmonary disease) ([place])   . Diverticulosis of colon   . Elevated prostate specific antigen (PSA)   . ETOH abuse   . GERD (gastroesophageal reflux disease)   . Hx of colonic polyps   . Hyperlipidemia   . Hypertension   . Pulmonary nodule   . Shortness of breath   . Stroke [facility])      Family History  Problem Relation Age of Onset  . Diabetes Father   . Lung cancer Brother   . Colon cancer Neg Hx   . Esophageal cancer Neg Hx   . Stomach cancer Neg Hx      Social History:   reports that he has been smoking cigarettes. He has a 55.00 pack-year smoking history. He has  never used smokeless tobacco. He reports that he drinks about 24.0 standard drinks of alcohol per week. He reports that he does not use drugs.  Medications  Current Facility-Administered Medications:  .  0.9 %  sodium chloride infusion, , Intravenous, Continuous, Thurnell Lose, MD, Last Rate: 75 mL/hr at 06/26/18 1143 .  acetaminophen (TYLENOL) tablet 650 mg, 650 mg, Oral, Q6H PRN, Kayleen Memos, DO .  aspirin EC tablet 81 mg, 81 mg, Oral, Daily, Candiss Norse, Margaree Mackintosh, MD .  chlordiazePOXIDE (LIBRIUM) capsule 25 mg, 25 mg, Oral, TID, Candiss Norse, Prashant K, MD .  cloNIDine (CATAPRES) tablet 0.2 mg, 0.2 mg, Oral, Q8H PRN, Thurnell Lose, MD .  feeding supplement (ENSURE ENLIVE) (ENSURE ENLIVE) liquid 237 mL, 237 mL, Oral, BID BM, Thurnell Lose, MD .  folic acid (FOLVITE) tablet 1 mg, 1 mg, Oral, Daily, Hall, Carole N, DO, 1 mg at 06/25/18 2231 .  gemfibrozil (LOPID) tablet 600 mg, 600 mg, Oral, BID, Singh, Prashant K, MD .  heparin injection 5,000 Units, 5,000 Units, Subcutaneous, Q8H, Singh, Prashant K, MD .  levETIRAcetam (KEPPRA) IVPB 1000 mg/100 mL premix, 1,000 mg, Intravenous, Q24H, Hall, Carole N, DO .  LORazepam (ATIVAN) injection 0-4 mg, 0-4  mg, Intravenous, Q6H, 2 mg at 06/25/18 2227 **OR** LORazepam (ATIVAN) tablet 0-4 mg, 0-4 mg, Oral, Q6H, Murray, Alyssa B, PA-C .  [START ON 06/28/2018] LORazepam (ATIVAN) injection 0-4 mg, 0-4 mg, Intravenous, Q12H **OR** [START ON 06/28/2018] LORazepam (ATIVAN) tablet 0-4 mg, 0-4 mg, Oral, Q12H, Murray, Alyssa B, PA-C .  LORazepam (ATIVAN) injection 2-3 mg, 2-3 mg, Intravenous, Q1H PRN, Irene Pap N, DO .  multivitamin with minerals tablet 1 tablet, 1 tablet, Oral, Daily, Irene Pap N, DO, 1 tablet at 06/25/18 2227 .  ondansetron (ZOFRAN) injection 4 mg, 4 mg, Intravenous, Q6H PRN, [place] Crane, Carole N, DO .  thiamine (VITAMIN B-1) tablet 100 mg, 100 mg, Oral, Daily, Hall, Carole N, DO, 100 mg at 06/25/18 2232   Exam: Current vital signs: BP  132/73 (BP Location: Right Arm)   Pulse 65   Temp 98.1 F (36.7 C) (Oral)   Resp 20   Wt 71.7 kg   SpO2 98%   BMI 22.05 kg/m  Vital signs in last 24 hours: Temp:  [98.1 F (36.7 C)-99.8 F (37.7 C)] 98.1 F (36.7 C) (09/30 1312) Pulse Rate:  [64-105] 65 (09/30 1312) Resp:  [20-24] 20 (09/30 1312) BP: (119-175)/(58-108) 132/73 (09/30 1312) SpO2:  [94 %-99 %] 98 % (09/30 1312) Weight:  [71.7 kg] 71.7 kg (09/30 1231)   Physical Exam  Constitutional: Appears well-developed and well-nourished.  Psych: Affect appropriate to situation Eyes: No scleral injection HENT: No OP obstrucion Head: Normocephalic.  Cardiovascular: Normal rate and regular rhythm.  Respiratory: Effort normal, non-labored breathing GI: Soft.  No distension. There is no tenderness.  Skin: WDI  Neuro: Mental Status: Patient is awake, alert, oriented to person, place, month, year, and situation. Patient is able to give a clear and coherent history. No signs of aphasia or neglect Cranial Nerves: II: Visual Fields are full. R pupil post surgical, L pupil reactive.  III,IV, VI: EOMI without ptosis or diploplia.  V: Facial sensation is symmetric to temperature VII: Facial movement is symmetric.  VIII: hearing is intact to voice X: Uvula elevates symmetrically XI: Shoulder shrug is symmetric. XII: tongue is midline without atrophy or fasciculations.  Motor: Tone is normal. Bulk is normal. 5/5 strength was present in all four extremities.  Sensory: Sensation is symmetric to light touch and temperature in the arms and legs. Cerebellar: FNF intact bilaterally    Labs I have reviewed labs in epic and the results pertinent to this consultation are:   CBC    Component Value Date/Time   WBC 10.1 06/26/2018 0521   RBC 4.18 (L) 06/26/2018 0521   HGB 14.5 06/26/2018 0521   HCT 44.0 06/26/2018 0521   PLT 218 06/26/2018 0521   MCV 105.3 (H) 06/26/2018 0521   MCH 34.7 (H) 06/26/2018 0521   MCHC 33.0  06/26/2018 0521   RDW 13.2 06/26/2018 0521   LYMPHSABS 1.2 06/25/2018 1537   MONOABS 0.6 06/25/2018 1537   EOSABS 0.1 06/25/2018 1537   BASOSABS 0.1 06/25/2018 1537    CMP     Component Value Date/Time   NA 138 06/26/2018 0521   K 3.8 06/26/2018 0521   CL 108 06/26/2018 0521   CO2 23 06/26/2018 0521   GLUCOSE 89 06/26/2018 0521   BUN 7 (L) 06/26/2018 0521   CREATININE 0.83 06/26/2018 0521   CALCIUM 8.5 (L) 06/26/2018 0521   PROT 5.4 (L) 06/26/2018 0521   ALBUMIN 3.3 (L) 06/26/2018 0521   AST 21 06/26/2018 0521   ALT 11 06/26/2018 0521  ALKPHOS 54 06/26/2018 0521   BILITOT 1.2 06/26/2018 0521   GFRNONAA >60 06/26/2018 0521   GFRAA >60 06/26/2018 0521    Lipid Panel     Component Value Date/Time   CHOL 165 08/27/2016 1020   TRIG 82.0 08/27/2016 1020   HDL 52.20 08/27/2016 1020   CHOLHDL 3 08/27/2016 1020   VLDL 16.4 08/27/2016 1020   LDLCALC 96 08/27/2016 1020   LDLDIRECT 79.9 09/30/2010 1211     Imaging I have reviewed the images obtained:  CT-scan of the brain-atrophy with patchy periventricular small vessel disease.  No evidence of acute infarct  MRI examination of the brain- ordered and pending  Etta Quill PA-C [facility] [phone]  M-F  (9:00 am- 5:00 PM)  06/26/2018, 3:16 PM   I have seen and evaluated the patient. I have reviewed the above note and made appropriate changes.   Assessment: 77 year old male presented to the hospital after waking up secondary to abnormal left hand movements. Post-ictally, he had left hemiparesis. At this time, I think that partial seizure is the most likely culprit. There is no evidence for EtOH withdrawal.   Recommendations: -Agree with CIWA protocol -Patient has been placed on Keppra 1000 mg every 24 hours IV, would change to 500mg  BID - MRI , EEG  Roland Rack, MD [facility] [phone]  If 7pm- 7am, please page neurology on call as listed in [place].

## 2018-06-26 NOTE — Evaluation (Addendum)
Physical Therapy Evaluation Patient Details Name: Blake [name] MRN: [id number] DOB: 1941-08-27 Today's Date: 06/26/2018   History of Present Illness  77 year old male who presented to the ED on 9/29 with left-sided weakness and AMS. Concern for seizures. MRI pending. PMH significant for EtOH abuse, previous alcohol withdrawal seizure, COPD, GERD, hypertension, hyperlipidemia, pulmonary nodule, and CVA.  Clinical Impression  Patient presents with generalized weakness, impaired coordination, visual deficits (premorbid), impaired balance and impaired mobility s/p above. Pt lives with roommates and reports independence with ADLs/gait PTA. Pt having difficulty managing items on tray today- spilling water all over self, not able to grab food correctly; with noted right eye blindness. Also noted to have poor awareness of safety. Ambulation deferred as pt wanting to eat lunch but tolerated taking a few steps to get to chair with Min A for balance/safety. Anticipate, pt will progress with mobility with increased activity. Will follow acutely to maximize independence and mobility prior to return home.    Follow Up Recommendations Home health PT;Supervision for mobility/OOB    Equipment Recommendations  None recommended by PT    Recommendations for Other Services       Precautions / Restrictions Precautions Precautions: Fall Restrictions Weight Bearing Restrictions: No      Mobility  Bed Mobility Overal bed mobility: Modified Independent             General bed mobility comments: Impulsive to get to EOB.  Transfers Overall transfer level: Needs assistance Equipment used: None Transfers: Sit to/from [employer] Sit to Stand: Min guard Stand pivot transfers: Min assist       General transfer comment: Min guard for safety. Stood from [employer] x2; transferred to chair with Min A for balance/line management.  Ambulation/Gait             General Gait Details: Deferred  as pt wanting to eat lunch.  Stairs            Wheelchair Mobility    Modified Rankin (Stroke Patients Only)       Balance Overall balance assessment: Needs assistance;History of Falls Sitting-balance support: Feet supported;No upper extremity supported Sitting balance-Leahy Scale: Good     Standing balance support: During functional activity Standing balance-Leahy Scale: Fair Standing balance comment: Requires external support for dynamic standing tasks.                             Pertinent Vitals/Pain Pain Assessment: No/denies pain    Home Living Family/patient expects to be discharged to:: Private residence Living Arrangements: Non-relatives/Friends Available Help at Discharge: Friend(s);Available PRN/intermittently Type of Home: Mobile home Home Access: Stairs to enter;Ramped entrance Entrance Stairs-Rails: Right;Left Entrance Stairs-Number of Steps: 5 Home Layout: One level Home Equipment: Walker - 2 wheels;Wheelchair - manual      Prior Function Level of Independence: Independent         Comments: fallen couple of times in six months     Hand Dominance   Dominant Hand: Right    Extremity/Trunk Assessment   Upper Extremity Assessment Upper Extremity Assessment: Defer to OT evaluation    Lower Extremity Assessment Lower Extremity Assessment: Generalized weakness       Communication   Communication: No difficulties  Cognition Arousal/Alertness: Awake/alert Behavior During Therapy: WFL for tasks assessed/performed Overall Cognitive Status: Impaired/Different from baseline Area of Impairment: Safety/judgement  Safety/Judgement: Decreased awareness of safety;Decreased awareness of deficits     General Comments: pt HOH; Difficulty managing things on tray- trying to pick up burger and only picked up top bun and then only top bun and burger; opened water but spilled bottle all over gown/self       General Comments      Exercises     Assessment/Plan    PT Assessment Patient needs continued PT services  PT Problem List Decreased strength;Decreased mobility;Decreased safety awareness;Decreased balance;Decreased activity tolerance;Cardiopulmonary status limiting activity;Decreased cognition       PT Treatment Interventions Functional mobility training;Balance training;Patient/family education;Gait training;Therapeutic activities;Stair training;Therapeutic exercise;DME instruction    PT Goals (Current goals can be found in the Care Plan section)  Acute Rehab PT Goals Patient Stated Goal: to feel better PT Goal Formulation: With patient Time For Goal Achievement: 07/10/18 Potential to Achieve Goals: Fair    Frequency Min 3X/week   Barriers to discharge Decreased caregiver support lives with roommates    Co-evaluation               AM-PAC PT "6 Clicks" Daily Activity  Outcome Measure Difficulty turning over in bed (including adjusting bedclothes, sheets and blankets)?: None Difficulty moving from lying on back to sitting on the side of the bed? : None Difficulty sitting down on and standing up from a chair with arms (e.g., wheelchair, bedside commode, etc,.)?: A Little Help needed moving to and from a bed to chair (including a wheelchair)?: A Little Help needed walking in hospital room?: A Little Help needed climbing 3-5 steps with a railing? : A Lot 6 Click Score: 19    End of Session Equipment Utilized During Treatment: Gait belt Activity Tolerance: Patient tolerated treatment well Patient left: in chair;with call bell/phone within reach;with chair alarm set Nurse Communication: Mobility status;Other (comment)(Rn notified pt needs supervision for eating- pushed tray away) PT Visit Diagnosis: Unsteadiness on feet (R26.81);Muscle weakness (generalized) (M62.81)    Time: 4540-9811 PT Time Calculation (min) (ACUTE ONLY): 28 min   Charges:   PT  Evaluation $PT Eval Moderate Complexity: 1 Mod PT Treatments $Therapeutic Activity: 8-22 mins        Wray Kearns, PT, DPT Acute Rehabilitation Services Pager [phone] Office [phone]      Marguarite Arbour A Sabra Heck 06/26/2018, 3:54 PM

## 2018-06-26 NOTE — Progress Notes (Signed)
EEG completed, results pending. 

## 2018-06-26 NOTE — Progress Notes (Signed)
Initial Nutrition Assessment  DOCUMENTATION CODES:   Severe malnutrition in context of social or environmental circumstances  INTERVENTION:   - Continue MVI, thiamine, and folic acid in the setting of EtOH abuse  - Ensure Enlive po BID, each supplement provides 350 kcal and 20 grams of protein  NUTRITION DIAGNOSIS:   Severe Malnutrition related to social / environmental circumstances (EtOH abuse) as evidenced by moderate fat depletion, severe fat depletion, mild muscle depletion, moderate muscle depletion.  GOAL:   Patient will meet greater than or equal to 90% of their needs  MONITOR:   PO intake, Supplement acceptance, Weight trends, Labs  REASON FOR ASSESSMENT:   Malnutrition Screening Tool    ASSESSMENT:   77 year old male who presented to the ED on 9/29 with left-sided weakness and AMS. PMH significant for EtOH abuse, previous alcohol withdrawal seizure, COPD, GERD, hypertension, hyperlipidemia, pulmonary nodule, and CVA.  Discussed pt with RN who reports pt was very lethargic.  Spoke with pt at bedside. Pt initially did not awaken to RD voice or physical exam but then did awaken and report that he was hungry. RD took pt's lunch order and called dining services to place order. Order included hamburger, fries, broccoli, and fruit cup. RD encouraged adequate PO intake.  Pt states that he typically eats 3 meals daily. Pt states, "I have a good appetite" and repeated that he is hungry at time of visit.  Breakfast: sausage, eggs, gravy biscuit from restaurant Lunch: hamburger Dinner: salisbury steak  Pt shares that he may have lost "a little" weight but is unsure how much or the timeframe of his weight loss. Pt reports his UBW as 170 lbs. RD noted no weight yet recorded since admission so obtained a bed scale weight: 71.7 kg. Will record measured weight in chart.  Weight history in chart is limited. Last recorded weight PTA is from 01/08/18: 67.7 kg. If accurate, pt has  gained 9 lbs over the past 5 months. Prior to 01/08/18, most recent weight is from 2017.  Pt requests "butterscotch" oral nutrition supplement. RD provided butter pecan Glucerna shake. RD asked if pt would be agreeable to receiving Ensure Enlive (does not come in butter pecan flavor) due to higher kcal and protein content. Pt states he is willing to try Ensure Enlive. RD to order.  Pt with continued concern about where his wallet is.  Medications reviewed and include: folic acid 1 mg daily, MVI with minerals daily, 100 mg thiamine daily  Labs reviewed.  NUTRITION - FOCUSED PHYSICAL EXAM:    Most Recent Value  Orbital Region  Moderate depletion  Upper Arm Region  Moderate depletion  Thoracic and Lumbar Region  Severe depletion  Buccal Region  Moderate depletion  Temple Region  Mild depletion  Clavicle Bone Region  Moderate depletion  Clavicle and Acromion Bone Region  Moderate depletion  Scapular Bone Region  Moderate depletion  Dorsal Hand  Mild depletion  Patellar Region  Moderate depletion  Anterior Thigh Region  Moderate depletion  Posterior Calf Region  Moderate depletion  Edema (RD Assessment)  None  Hair  Reviewed  Eyes  Reviewed  Mouth  Reviewed  Skin  Reviewed  Nails  Reviewed       Diet Order:   Diet Order            Diet regular Room service appropriate? Yes; Fluid consistency: Thin  Diet effective now              EDUCATION NEEDS:  No education needs have been identified at this time  Skin:  Skin Assessment: Reviewed RN Assessment  Last BM:  unknown/PTA  Height:   Ht Readings from Last 1 Encounters:  01/08/18 5\' 11"  (1.803 m)    Weight:   Wt Readings from Last 1 Encounters:  06/26/18 71.7 kg    Ideal Body Weight:  78.18 kg  BMI:  Body mass index is 22.05 kg/m.  Estimated Nutritional Needs:   Kcal:  1900-2100  Protein:  95-110 grams  Fluid:  1.9-2.1 L    Gaynell Face, MS, RD, LDN Inpatient Clinical Dietitian Pager:  [phone] Weekend/After Hours: [phone]

## 2018-06-26 NOTE — Evaluation (Signed)
Clinical/Bedside Swallow Evaluation Patient Details  Name: Blake [name] MRN: [id number] Date of Birth: 1941-05-05  Today's Date: 06/26/2018 Time: SLP Start Time (ACUTE ONLY): 0901 SLP Stop Time (ACUTE ONLY): 0914 SLP Time Calculation (min) (ACUTE ONLY): 13 min  Past Medical History:  Past Medical History:  Diagnosis Date  . Chronic insomnia   . Cigarette smoker   . COPD (chronic obstructive pulmonary disease) ([place])   . Diverticulosis of colon   . Elevated prostate specific antigen (PSA)   . ETOH abuse   . GERD (gastroesophageal reflux disease)   . Hx of colonic polyps   . Hyperlipidemia   . Hypertension   . Pulmonary nodule   . Shortness of breath   . Stroke [facility])    Past Surgical History:  Past Surgical History:  Procedure Laterality Date  . COLONOSCOPY    . needle biopsy RUL nodule  01/2009   benign  . TONSILLECTOMY  1947  . VASECTOMY     HPI:  Pt is a 77 y.o. male who presented with uncontrollable left-sided shaking movements concerning for alcohol induced seizure activity. CXR was clear on admission; CT Head without acute findings. PMH includes:  COPD, hypertension, pulmonary nodule, alcohol abuse, alcohol withdrawal induced seizures, CVA, pulmonary nodule, shortness of breath, HTN, HLD, GERD   Assessment / Plan / Recommendation Clinical Impression  Pt has signs of a mild dysphagia, including intermittent inhalation post-swallow suggestive of reduced ability to coordinate breath/swallow. Frequent eructation is also noted, which may be more related to an esophageal component, particularly given his h/o GERD. A delayed cough was noted as pt started to partially recline after trials stopped. Despite the above, CXR was clear upon admission. Recommend starting regular diet textures and thin liquids. Pt may need supervision during meals to assist with set-up and sustained attention to meals. SLP f/u warranted given mild risk that could be exacerbated should his mentation decline  any further.  SLP Visit Diagnosis: Dysphagia, unspecified (R13.10)    Aspiration Risk  Mild aspiration risk    Diet Recommendation Regular;Thin liquid   Liquid Administration via: Cup;Straw Medication Administration: Whole meds with liquid Supervision: Staff to assist with self feeding Compensations: Minimize environmental distractions;Slow rate;Small sips/bites Postural Changes: Seated upright at 90 degrees;Remain upright for at least 30 minutes after po intake    Other  Recommendations Oral Care Recommendations: Oral care BID   Follow up Recommendations None      Frequency and Duration min 2x/week  1 week       Prognosis        Swallow Study   General HPI: Pt is a 77 y.o. male who presented with uncontrollable left-sided shaking movements concerning for alcohol induced seizure activity. CXR was clear on admission; CT Head without acute findings. PMH includes:  COPD, hypertension, pulmonary nodule, alcohol abuse, alcohol withdrawal induced seizures, CVA, pulmonary nodule, shortness of breath, HTN, HLD, GERD Type of Study: Bedside Swallow Evaluation Previous Swallow Assessment: none in chart Diet Prior to this Study: NPO Temperature Spikes Noted: No Respiratory Status: Room air History of Recent Intubation: No Behavior/Cognition: Alert;Cooperative;Other (Comment);Distractible(HOH) Oral Cavity Assessment: Within Functional Limits Oral Care Completed by SLP: No Oral Cavity - Dentition: Dentures, top;Dentures, bottom Vision: Functional for self-feeding Self-Feeding Abilities: Able to feed self;Needs set up Patient Positioning: Upright in bed Baseline Vocal Quality: Normal Volitional Cough: Strong Volitional Swallow: Able to elicit    Oral/Motor/Sensory Function Overall Oral Motor/Sensory Function: Mild impairment(asymmetric pupils; previously documented per chart review)   Ice Chips  Ice chips: Not tested   Thin Liquid Thin Liquid: Impaired Presentation: Cup;Self  Fed;Straw Pharyngeal  Phase Impairments: Other (comments)(intermittent inhalation post-swallow)    Nectar Thick Nectar Thick Liquid: Not tested   Honey Thick Honey Thick Liquid: Not tested   Puree Puree: Not tested(not available on floor)   Solid     Solid: Impaired Presentation: Self Fed Pharyngeal Phase Impairments: Cough - Delayed      Germain Osgood 06/26/2018,9:23 AM   Germain Osgood, M.A. [place] Acute Environmental education officer [phone] Office [phone]

## 2018-06-27 ENCOUNTER — Inpatient Hospital Stay (HOSPITAL_COMMUNITY): Payer: Medicare Other

## 2018-06-27 LAB — BASIC METABOLIC PANEL
ANION GAP: 4 — AB (ref 5–15)
BUN: 10 mg/dL (ref 8–23)
CALCIUM: 8.3 mg/dL — AB (ref 8.9–10.3)
CO2: 24 mmol/L (ref 22–32)
CREATININE: 0.83 mg/dL (ref 0.61–1.24)
Chloride: 109 mmol/L (ref 98–111)
GFR calc non Af Amer: >60 mL/min (ref >60)
Glucose, Bld: 110 mg/dL — ABNORMAL HIGH (ref 70–99)
Potassium: 3.9 mmol/L (ref 3.5–5.1)
Sodium: 137 mmol/L (ref 135–145)

## 2018-06-27 LAB — MAGNESIUM: Magnesium: 1.8 mg/dL (ref 1.7–2.4)

## 2018-06-27 MED ORDER — LEVETIRACETAM 500 MG PO TABS
500.0000 mg | ORAL_TABLET | Freq: Two times a day (BID) | ORAL | Status: DC
  Administered 2018-06-27 – 2018-06-29 (×4): 500 mg via ORAL
  Filled 2018-06-27 (×5): qty 1

## 2018-06-27 NOTE — Progress Notes (Signed)
Dr. Candiss Norse notified of fall and orders received to move pt closer to desk and continue high fall precautions. No other orders given at this time. No changes in assessment.

## 2018-06-27 NOTE — Progress Notes (Signed)
@IPLOG @        PROGRESS NOTE                                                                                                                                                                                                             Patient Demographics:    Blake [name], is a 77 y.o. male, DOB - 04-Jun-1941, GGE:[id number]  Admit date - 06/25/2018   Admitting Physician Kayleen Memos, DO  Outpatient Primary MD for the patient is Nche, Charlene Brooke, NP  LOS - 1  Chief Complaint  Patient presents with  . Altered Mental Status       Brief Narrative  Blake [name] is a 77 y.o. male with medical history significant for COPD, hypertension, pulmonary nodule, alcohol abuse, alcohol withdrawal induced seizures who presented to [facility] ED from home where he lives with friends due to uncontrollable left-sided shaking movements that started the morning of the day of admission.  Apparently he drinks about 6 to 12 bottles of beer daily and skipped alcoholic for a day, he does not recall why in the ER he was admitted with a running diagnosis of focal seizure mostly affecting his left side, CT head was unremarkable, neurologist Dr. Cheral Marker was consulted over the phone who recommended that his presentation was typical of his previous presentation not too long ago.  He was admitted for alcohol withdrawal seizures.   Subjective:   Patient in bed, appears comfortable, denies any headache, no fever, no chest pain or pressure, no shortness of breath , no abdominal pain. No focal weakness.   Assessment  & Plan :     1.  Seizure.  This likely was partial complex seizure, initially thought to be alcohol withdrawal, however symptoms did not match, he was kept in the hospital and seizure work-up was initiated which included CT brain, MRI and EEG which were all unremarkable.  Neuro was consulted.  He has now been placed on Keppra which will be continued, increase activity PT eval..  2.  History of heavy alcohol abuse  and smoking.  Counseled to quit all, placed on Librium along with CIWA protocol.  Monitor for DTs.  3.  Hypertension.  For now PRN Catapres.  4.  Dyslipidemia.  Placed on home dose statin.  5.  Deconditioning.  Advance activity.  PT eval.   Patient was switched from observation to inpatient as initially he was thought to be having alcohol withdrawal seizure however his symptoms did not match that of alcohol withdrawal hence a full  seizure work-up was initiated and neurology was called, he is now been placed on Keppra.  Continue treatment for potential DTs and deconditioning quiring PT.  Still quite frail.   Family Communication  :  None  Code Status :  Full  Disposition Plan  :  Med - surg  Consults  : Neurology  Procedures  :    CT head.  Nonacute.  RI brain and EEG.  Both nonacute.  DVT Prophylaxis  :  Heparin   Lab Results  Component Value Date   PLT 218 06/26/2018    Diet :  Diet Order            Diet regular Room service appropriate? Yes; Fluid consistency: Thin  Diet effective now               Inpatient Medications Scheduled Meds: . aspirin EC  81 mg Oral Daily  . chlordiazePOXIDE  25 mg Oral TID  . feeding supplement (ENSURE ENLIVE)  237 mL Oral BID BM  . folic acid  1 mg Oral Daily  . gemfibrozil  600 mg Oral BID  . heparin injection (subcutaneous)  5,000 Units Subcutaneous Q8H  . LORazepam  0-4 mg Intravenous Q6H   Or  . LORazepam  0-4 mg Oral Q6H  . [START ON 06/28/2018] LORazepam  0-4 mg Intravenous Q12H   Or  . [START ON 06/28/2018] LORazepam  0-4 mg Oral Q12H  . multivitamin with minerals  1 tablet Oral Daily  . [START ON 06/30/2018] thiamine  100 mg Oral Daily   Continuous Infusions: . levETIRAcetam 500 mg (06/27/18 0519)   PRN Meds:.acetaminophen, cloNIDine, LORazepam, ondansetron (ZOFRAN) IV  Antibiotics  :   Anti-infectives (From admission, onward)   None        Objective:   Vitals:   06/26/18 2008 06/27/18 0010 06/27/18 0400  06/27/18 0512  BP: 126/81 (!) 150/63 (!) 150/62 (!) 145/70  Pulse: 75 (!) 58 (!) 58 (!) 53  Resp: 18   16  Temp: 98.2 F (36.8 C)   97.8 F (36.6 C)  TempSrc: Oral   Oral  SpO2: 99% 94%  96%  Weight:        Wt Readings from Last 3 Encounters:  06/26/18 71.7 kg  01/08/18 67.7 kg  08/27/16 68.5 kg     Intake/Output Summary (Last 24 hours) at 06/27/2018 1107 Last data filed at 06/27/2018 0519 Gross per 24 hour  Intake 1127.49 ml  Output 650 ml  Net 477.49 ml     Physical Exam  Awake Alert,  No new F.N deficits, Normal affect Garza-[place] II.AT,PERRAL Supple Neck,No JVD, No cervical lymphadenopathy appriciated.  Symmetrical Chest wall movement, Good air movement bilaterally, CTAB RRR,No Gallops, Rubs or new Murmurs, No Parasternal Heave +ve B.Sounds, Abd Soft, No tenderness, No organomegaly appriciated, No rebound - guarding or rigidity. No Cyanosis, Clubbing or edema, No new Rash or bruise    Data Review:    CBC Recent Labs  Lab 06/25/18 1537 06/26/18 0521  WBC 7.6 10.1  HGB 15.7 14.5  HCT 47.8 44.0  PLT 214 218  MCV 105.8* 105.3*  MCH 34.7* 34.7*  MCHC 32.8 33.0  RDW 13.2 13.2  LYMPHSABS 1.2  --   MONOABS 0.6  --   EOSABS 0.1  --   BASOSABS 0.1  --     Chemistries  Recent Labs  Lab 06/25/18 1537 06/26/18 0521 06/27/18 0401  NA 138 138 137  K 4.4 3.8 3.9  CL 108 108 109  CO2 21* 23 24  GLUCOSE 93 89 110*  BUN 7* 7* 10  CREATININE 0.70 0.83 0.83  CALCIUM 8.7* 8.5* 8.3*  MG  --  2.0 1.8  AST 23 21  --   ALT 11 11  --   ALKPHOS 60 54  --   BILITOT 0.8 1.2  --    ------------------------------------------------------------------------------------------------------------------ No results for input(s): CHOL, HDL, LDLCALC, TRIG, CHOLHDL, LDLDIRECT in the last 72 hours.  Lab Results  Component Value Date   HGBA1C 4.5 (L) 08/27/2016   ------------------------------------------------------------------------------------------------------------------ No  results for input(s): TSH, T4TOTAL, T3FREE, THYROIDAB in the last 72 hours.  Invalid input(s): FREET3 ------------------------------------------------------------------------------------------------------------------ No results for input(s): VITAMINB12, FOLATE, FERRITIN, TIBC, IRON, RETICCTPCT in the last 72 hours.  Coagulation profile Recent Labs  Lab 06/25/18 1537  INR 0.97    No results for input(s): DDIMER in the last 72 hours.  Cardiac Enzymes No results for input(s): CKMB, TROPONINI, MYOGLOBIN in the last 168 hours.  Invalid input(s): CK ------------------------------------------------------------------------------------------------------------------ No results found for: BNP  Micro Results No results found for this or any previous visit (from the past 240 hour(s)).  Radiology Reports Dg Chest 2 View  Result Date: 06/25/2018 CLINICAL DATA:  Cough EXAM: CHEST - 2 VIEW COMPARISON:  10/14/2014 FINDINGS: There is no focal parenchymal opacity. There is no pleural effusion or pneumothorax. The heart and mediastinal contours are unremarkable. There is a healed old posttraumatic deformity of left proximal humerus. There is an age-indeterminate L1 vertebral body compression fracture. IMPRESSION: No active cardiopulmonary disease. Electronically Signed   By: Kathreen Devoid   On: 06/25/2018 16:31   Ct Head Wo Contrast  Result Date: 06/25/2018 CLINICAL DATA:  Left-sided weakness and altered mental status EXAM: CT HEAD WITHOUT CONTRAST TECHNIQUE: Contiguous axial images were obtained from the base of the skull through the vertex without intravenous contrast. COMPARISON:  January 07, 2018 FINDINGS: Brain: Moderate diffuse atrophy is stable. There is no intracranial mass, hemorrhage, extra-axial fluid collection, or midline shift. There is patchy small vessel disease in the centra semiovale bilaterally. No new brain parenchymal lesions evident. No acute infarct is appreciable. Vascular: No  hyperdense vessels evident. There is calcification in each carotid siphon region. Skull: Bony calvarium appears intact. Sinuses/Orbits: There is a small retention cyst in the inferomedial left maxillary antrum. There is mucosal thickening in several ethmoid air cells bilaterally. Postoperative changes are noted in the left lateral orbital wall and anterior left maxillary antrum. Orbits appear symmetric bilaterally. There is evidence of previous cataract removals bilaterally. Other: Mastoid air cells are clear. There is debris in each external auditory canal. IMPRESSION: Atrophy with patchy periventricular small vessel disease. No evident acute infarct. No mass or hemorrhage. There are foci of arterial vascular calcification. Foci of paranasal sinus disease noted with areas of postoperative change in the left facial region. There is probable cerumen in each external auditory canal. Electronically Signed   By: Lowella Grip III M.D.   On: 06/25/2018 16:01   Mr Brain Wo Contrast  Result Date: 06/27/2018 CLINICAL DATA:  77 year old male with left side weakness and altered mental status. EXAM: MRI HEAD WITHOUT CONTRAST TECHNIQUE: Multiplanar, multiecho pulse sequences of the brain and surrounding structures were obtained without intravenous contrast. COMPARISON:  Head CT 06/25/2018.  Brain MRI 10/15/2014. FINDINGS: Brain: Stable cerebral volume since the prior MRI. No restricted diffusion to suggest acute infarction. No midline shift, mass effect, evidence of mass lesion, ventriculomegaly, extra-axial collection or acute intracranial hemorrhage. Cervicomedullary junction and pituitary are  within normal limits. Stable gray-white matter differentiation throughout the brain. Prominent mineralization of the bilateral basal ganglia. Mild for age patchy cerebral white matter T2 and FLAIR hyperintensity. No cortical encephalomalacia or definite chronic cerebral blood products identified. No new signal abnormality.  Vascular: Major intracranial vascular flow voids are stable since 2016. Skull and upper cervical spine: Chronic ligamentous hypertrophy about the odontoid. Normal visible bone marrow signal. Sinuses/Orbits: Stable and negative. Other: Mastoid air cells remain clear. Visible internal auditory structures appear normal. Scalp and face soft tissues appear negative. IMPRESSION: No acute intracranial abnormality and stable MRI appearance of the brain since 2016. Electronically Signed   By: Genevie Ann M.D.   On: 06/27/2018 08:24    Time Spent in minutes  30   Lala Lund M.D on 06/27/2018 at 11:07 AM  To page go to [email or web address] - password [facility]

## 2018-06-27 NOTE — Progress Notes (Signed)
  Speech Language Pathology Treatment: Dysphagia  Patient Details Name: Blake [name] MRN: [id number] DOB: 19-Aug-1941 Today's Date: 06/27/2018 Time: 9826-4158 SLP Time Calculation (min) (ACUTE ONLY): 11 min  Assessment / Plan / Recommendation Clinical Impression  Upon SLP arrival pt was finishing his lunch meal. He was laying almost completely reclined in bed, trying to masticate chicken and lettuce, while simultaneously drinking thin liquids via straw. Pt had an episode of coughing that followed. SLP provided Mod cues for repositioning for safer intake. He still needed Mod cues for lingual sweep and liquid wash, with multiple attempts at both needed for pt to clear lettuce from his mouth. Recommend downgrade to Dys 3 diet, continuing thin liquids. Pt in agreement, stating that the food on his trays have been difficult to chew. Will make adjustments and continue to follow. Pt will also likely need supervision at least intermittently/for set-up to ensure safer positioning for meals.   HPI HPI: Pt is a 77 y.o. male who presented with uncontrollable left-sided shaking movements concerning for alcohol induced seizure activity. CXR was clear on admission; CT Head without acute findings. PMH includes:  COPD, hypertension, pulmonary nodule, alcohol abuse, alcohol withdrawal induced seizures, CVA, pulmonary nodule, shortness of breath, HTN, HLD, GERD      SLP Plan  Continue with current plan of care       Recommendations  Diet recommendations: Dysphagia 3 (mechanical soft);Thin liquid Liquids provided via: Cup;Straw Medication Administration: Whole meds with liquid Supervision: Patient able to self feed;Intermittent supervision to cue for compensatory strategies(set-up assistance) Compensations: Minimize environmental distractions;Slow rate;Small sips/bites Postural Changes and/or Swallow Maneuvers: Seated upright 90 degrees                Oral Care Recommendations: Oral care BID Follow up  Recommendations: 24 hour supervision/assistance SLP Visit Diagnosis: Dysphagia, unspecified (R13.10) Plan: Continue with current plan of care       GO                Germain Osgood 06/27/2018, 3:14 PM  Germain Osgood, M.A. [place] Acute Environmental education officer [phone] Office [phone]

## 2018-06-27 NOTE — Progress Notes (Signed)
PHARMACIST - PHYSICIAN COMMUNICATION  DR:   Neurology  CONCERNING: IV to Oral Route Change Policy  RECOMMENDATION: This patient is receiving Keppra by the intravenous route.  Based on criteria approved by the Pharmacy and Therapeutics Committee, the intravenous medication(s) is/are being converted to the equivalent oral dose form(s).   DESCRIPTION: These criteria include:  The patient is eating (either orally or via tube) and/or has been taking other orally administered medications for a least 24 hours  The patient has no evidence of active gastrointestinal bleeding or impaired GI absorption (gastrectomy, Speir bowel, patient on TNA or NPO).  If you have questions about this conversion, please contact the Pharmacy Department  []   [phone] )  [facility] []   [phone] )  [facility] [x]   [phone] )  [facility] []   [phone] )  [facility] []   [phone] )  [place], [place] 06/27/2018 11:40 AM

## 2018-06-27 NOTE — Progress Notes (Signed)
   06/27/18 1900  What Happened  Was fall witnessed? Yes  Who witnessed fall? Kristopher Glee, NT  Patients activity before fall ambulating-unassisted  Point of contact arm/shoulder  Was patient injured? No  Follow Up  MD notified Dr. Candiss Norse  Time MD notified 1902  Family notified Yes-comment  Time family notified 1914 (KAREN [facility]))  Additional tests No  Simple treatment Dressing (small skin tear right forearm)  Progress note created (see row info) Yes  Adult Fall Risk Assessment  Risk Factor Category (scoring not indicated) High fall risk per protocol (document High fall risk)  Patient Fall Risk Level High fall risk  Adult Fall Risk Interventions  Required Bundle Interventions *See Row Information* High fall risk - low, moderate, and high requirements implemented  Additional Interventions Use of appropriate toileting equipment (bedpan, BSC, etc.);Room near nurses station;Reorient/diversional activities with confused patients;PT/OT need assessed if change in mobility from baseline;Camera surveillance (with patient/family notification & education)  Screening for Fall Injury Risk (To be completed on HIGH fall risk patients) - Assessing Need for Low Bed  Risk For Fall Injury- Low Bed Criteria 85 years or older  Will Implement Low Bed and Floor Mats Yes  Screening for Fall Injury Risk (To be completed on HIGH fall risk patients who do not meet crieteria for Low Bed) - Assessing Need for Floor Mats Only  Risk For Fall Injury- Criteria for Floor Mats Confusion/dementia (+CAM, CIWA, TBI, etc.)  Will Implement Floor Mats Yes  Vitals  Temp 98.4 F (36.9 C)  Temp Source Oral  BP (!) 147/73  MAP (mmHg) 65  BP Location Left Arm  BP Method Automatic  Patient Position (if appropriate) Sitting  Pulse Rate Source Monitor  Oxygen Therapy  SpO2 100 %  Neurological  Neuro (WDL) X  Level of Consciousness Alert  Orientation Level Oriented to person;Oriented to place;Oriented to  time;Disoriented to situation  Cognition Impulsive;Poor attention/concentration;Poor judgement;Poor safety awareness;Unable to follow commands;Memory impairment  Speech Clear  R Hand Grip Present  L Hand Grip Present  Neuro Symptoms Anxiety;Forgetful

## 2018-06-27 NOTE — Progress Notes (Signed)
Primary RN spoke with emergency contact, Santiago Glad (niece) and updated on pt condition. She was very helpful in giving some background information on pt history, as pt is a poor historian. She stated that the pt has been "spiraling out of control" since his wife passed a few years ago and he began drinking even heavier than previously stated. Santiago Glad was very understanding and stated she would call back in the morning to check on pt for updates.

## 2018-06-27 NOTE — Progress Notes (Signed)
PT Cancellation Note  Patient Details Name: Blake [name] MRN: [id number] DOB: 09/09/1941   Cancelled Treatment:    Reason Eval/Treat Not Completed: Other (comment).  MD in patient's room.  Will f/u per POC.    Michel Santee 06/27/2018, 3:41 PM

## 2018-06-27 NOTE — Progress Notes (Addendum)
Subjective: Patient sitting comfortably eating breakfast.  Has no complaints.  States he has had no further episodes.  Exam: Vitals:   06/27/18 0400 06/27/18 0512  BP: (!) 150/62 (!) 145/70  Pulse: (!) 58 (!) 53  Resp:  16  Temp:  97.8 F (36.6 C)  SpO2:  96%    Physical Exam   HEENT-  Normocephalic, no lesions, without obvious abnormality.  Normal external eye and conjunctiva.   Extremities- Warm, dry and intact Musculoskeletal-no joint tenderness, deformity or swelling Skin-warm and dry, no hyperpigmentation, vitiligo, or suspicious lesions    Neuro:  Mental Status: Alert, oriented, thought content appropriate.  Speech fluent without evidence of aphasia.  Able to follow 3 step commands without difficulty. Cranial Nerves: II:  Visual fields grossly normal,  III,IV, VI: ptosis not present, extra-ocular motions intact bilaterally pupils equal, round, reactive to light and accommodation V,VII: smile symmetric, facial light touch sensation normal bilaterally VIII: hearing normal bilaterally IX,X: uvula rises midline XI: bilateral shoulder shrug XII: midline tongue extension Motor: Right : Upper extremity   5/5    Left:     Upper extremity   5/5  Lower extremity   5/5     Lower extremity   5/5 Tone and bulk:normal tone throughout; no atrophy noted Sensory: Pinprick and light touch intact throughout, bilaterally Deep Tendon Reflexes: 2+ and symmetric throughout Plantars: Right: downgoing   Left: downgoing Cerebellar: normal finger-to-nose, normal rapid alternating movements and normal heel-to-shin test Gait: normal gait and station    Medications:  Scheduled: . aspirin EC  81 mg Oral Daily  . chlordiazePOXIDE  25 mg Oral TID  . feeding supplement (ENSURE ENLIVE)  237 mL Oral BID BM  . folic acid  1 mg Oral Daily  . gemfibrozil  600 mg Oral BID  . heparin injection (subcutaneous)  5,000 Units Subcutaneous Q8H  . LORazepam  0-4 mg Intravenous Q6H   Or  . LORazepam   0-4 mg Oral Q6H  . [START ON 06/28/2018] LORazepam  0-4 mg Intravenous Q12H   Or  . [START ON 06/28/2018] LORazepam  0-4 mg Oral Q12H  . multivitamin with minerals  1 tablet Oral Daily  . [START ON 06/30/2018] thiamine  100 mg Oral Daily   Continuous: . sodium chloride 75 mL/hr at 06/26/18 2209  . levETIRAcetam 500 mg (06/27/18 0519)    Pertinent Labs/Diagnostics: EEG: This EEG is characterized by slowing which is consistent with normal drowse.  Can not rule out the possibility of slowing related to general cerebral disturbance such as a metabolic encephalopathy.  Clinical correlation recommended.  No epileptiform activity is noted.    Mr Brain Lottie Dawson Contrast--no acute intracranial abnormalities and stable MRI appearance of the brain since 2016  Result Date: 06/27/2018  IMPRESSION: No acute intracranial abnormality and stable MRI appearance of the brain since 2016. Electronically Signed   By: Genevie Ann M.D.   On: 06/27/2018 08:24     Etta Quill PA-C [facility] [phone] I have seen the patient and reviewed the note.  Assessment: 77 year old male presented to the hospital after waking up secondary to abnormal left hand movements.  This was followed by left hemiparesis.  He has a history of a previous episode concerning for partial seizure in April of this year.  In the setting, I do think that antiepileptic therapy is warranted and therefore I  Would continue Keppra 500 mg moving forward.  Recommendations: - CIWA protocol - Remain on Keppra 500 mg twice daily however  may change to oral. -Please call if neurology can be of any further assistance  Roland Rack, MD [facility] [phone]  If 7pm- 7am, please page neurology on call as listed in [place].  06/27/2018, 10:05 AM

## 2018-06-28 MED ORDER — CHLORDIAZEPOXIDE HCL 5 MG PO CAPS
10.0000 mg | ORAL_CAPSULE | Freq: Three times a day (TID) | ORAL | Status: DC
  Administered 2018-06-28 – 2018-06-29 (×4): 10 mg via ORAL
  Filled 2018-06-28 (×4): qty 2

## 2018-06-28 NOTE — Progress Notes (Signed)
Physical Therapy Treatment Patient Details Name: Blake [name] MRN: [id number] DOB: [birth year] Today's Date: 06/28/2018    History of Present Illness Pt is a 77 y.o. male admitted 06/25/18 with left-sided weakness and AMS; admitted for alcohol withdrawal seizures. MRI showed no acute intracranial abnormality. EEG with no epileptiform activity noted. PMH significant for EtOH abuse, previous alcohol withdrawal seizure, COPD, GERD, HTN, HLD, pulmonary nodule, and CVA.   PT Comments    Pt slowly progressing with mobility. Pt requires modA to correct multiple losses of balance while ambulating without UE support; able to amb with RW and intermittent minA to prevent LOB. Pt remains limited by decreased safety awareness, generalized weakness, decreased activity tolerance, and very poor insight into deficits. Pt asked if he is in hospital because he had stroke; reoriented to reason for admission. Discussed recommendation for [place]-term SNF-level therapies to maximize functional mobility and independence prior to return home; pt in agreement with this. RN, SW, and MD made aware of update to d/c recs. Will follow acutely.    Follow Up Recommendations  SNF;Supervision/Assistance - 24 hour     Equipment Recommendations  None recommended by PT    Recommendations for Other Services       Precautions / Restrictions Precautions Precautions: Fall Restrictions Weight Bearing Restrictions: No    Mobility  Bed Mobility Overal bed mobility: Needs Assistance Bed Mobility: Supine to Sit;Sit to Supine     Supine to sit: Supervision Sit to supine: Supervision   General bed mobility comments: Significant increased time and effort  Transfers Overall transfer level: Needs assistance Equipment used: None;Rolling walker (2 wheeled) Transfers: Sit to/from Stand Sit to Stand: Min assist         General transfer comment: Stood from bed without support, posterior LOB upon standing requiring minA to  control descent back to EOB. Stood from chair with RW and minA to steady walker  Ambulation/Gait Ambulation/Gait assistance: Mod assist;Min guard   Assistive device: None;Rolling walker (2 wheeled) Gait Pattern/deviations: Shuffle;Staggering right;Staggering left;Trunk flexed     General Gait Details: ModA to prevent LOB in all directions ambulating without UE support; amb in hallway with RW and close min guard due to poor balance, poor safety awareness, and intermittently running into objects in hall. Able to lengthen stride with cues, but then immediately returning to shuffle gait pattern. MinA to correct LOB with quick turn   Stairs             Wheelchair Mobility    Modified Rankin (Stroke Patients Only)       Balance Overall balance assessment: Needs assistance;History of Falls Sitting-balance support: Feet supported;No upper extremity supported Sitting balance-Leahy Scale: Fair     Standing balance support: During functional activity Standing balance-Leahy Scale: Poor Standing balance comment: Requires external support for dynamic standing tasks.                            Cognition Arousal/Alertness: Awake/alert Behavior During Therapy: WFL for tasks assessed/performed Overall Cognitive Status: Impaired/Different from baseline Area of Impairment: Orientation;Attention;Memory;Following commands;Safety/judgement;Awareness;Problem solving                 Orientation Level: Disoriented to;Situation Current Attention Level: Sustained Memory: Decreased [place]-term memory Following Commands: Follows multi-step commands inconsistently;Follows one step commands with increased time Safety/Judgement: Decreased awareness of safety;Decreased awareness of deficits Awareness: Emergent Problem Solving: Slow processing;Requires verbal cues General Comments: Very poor awareness throughout session; asked if he was in hospital  because he had stroke, reoriented  to reason for admission; pt does not recall similar prior admission due to same reason      Exercises      General Comments        Pertinent Vitals/Pain Pain Assessment: No/denies pain    Home Living                      Prior Function            PT Goals (current goals can now be found in the care plan section) Acute Rehab PT Goals Patient Stated Goal: Agreeable to SNF prior to returning home PT Goal Formulation: With patient Time For Goal Achievement: 07/10/18 Potential to Achieve Goals: Fair Progress towards PT goals: Progressing toward goals    Frequency    Min 3X/week      PT Plan Discharge plan needs to be updated    Co-evaluation              AM-PAC PT "6 Clicks" Daily Activity  Outcome Measure  Difficulty turning over in bed (including adjusting bedclothes, sheets and blankets)?: A Little Difficulty moving from lying on back to sitting on the side of the bed? : A Little Difficulty sitting down on and standing up from a chair with arms (e.g., wheelchair, bedside commode, etc,.)?: Unable Help needed moving to and from a bed to chair (including a wheelchair)?: A Little Help needed walking in hospital room?: A Little Help needed climbing 3-5 steps with a railing? : A Lot 6 Click Score: 15    End of Session Equipment Utilized During Treatment: Gait belt Activity Tolerance: Patient tolerated treatment well Patient left: in bed;with call bell/phone within reach;with bed alarm set;with nursing/sitter in room Nurse Communication: Mobility status PT Visit Diagnosis: Unsteadiness on feet (R26.81);Muscle weakness (generalized) (M62.81)     Time: 3267-1245 PT Time Calculation (min) (ACUTE ONLY): 28 min  Charges:  $Gait Training: 8-22 mins $Therapeutic Activity: 8-22 mins                     Mabeline Caras, PT, DPT Acute Rehabilitation Services  Pager [phone] Office [phone]  Derry Lory 06/28/2018, 9:54 AM

## 2018-06-28 NOTE — Progress Notes (Signed)
  Speech Language Pathology Treatment: Dysphagia  Patient Details Name: Blake [name] MRN: [id number] DOB: Feb 19, 1941 Today's Date: 06/28/2018 Time: 4270-6237 SLP Time Calculation (min) (ACUTE ONLY): 9 min  Assessment / Plan / Recommendation Clinical Impression  Pt had just finished breakfast but was agreeable to thin liquid trials with SLP. He had coughing at baseline, also noted intermittently during drinking. There was only one time that coughing seemed to coincide with drinking - when he was orally holding the milk, then tried to swallow with a strong cough that followed. Otherwise, cough was intermittent and soft, almost like a throat clearing. Pt remains afebrile and lung sounds are documented to be clear. He says that he coughs throughout the day - not just at meals. He also says he prefers the Dys 3 diet as it is easier to chew. Recommend continuing that with thin liquids and additional SLP f/u for tolerance.   HPI HPI: Pt is a 77 y.o. male who presented with uncontrollable left-sided shaking movements concerning for alcohol induced seizure activity. CXR was clear on admission; CT Head without acute findings. PMH includes:  COPD, hypertension, pulmonary nodule, alcohol abuse, alcohol withdrawal induced seizures, CVA, pulmonary nodule, shortness of breath, HTN, HLD, GERD      SLP Plan  Continue with current plan of care       Recommendations  Diet recommendations: Dysphagia 3 (mechanical soft);Thin liquid Liquids provided via: Cup;Straw Medication Administration: Whole meds with liquid Supervision: Patient able to self feed;Intermittent supervision to cue for compensatory strategies(set-up assistance) Compensations: Minimize environmental distractions;Slow rate;Small sips/bites Postural Changes and/or Swallow Maneuvers: Seated upright 90 degrees                Oral Care Recommendations: Oral care BID Follow up Recommendations: 24 hour supervision/assistance SLP Visit  Diagnosis: Dysphagia, unspecified (R13.10) Plan: Continue with current plan of care       GO                Germain Osgood 06/28/2018, 11:21 AM  Germain Osgood, M.A. [place] Acute Environmental education officer [phone] Office [phone]

## 2018-06-28 NOTE — NC FL2 (Signed)
[place] LEVEL OF CARE SCREENING TOOL     IDENTIFICATION  Patient Name: Blake [name] Birthdate: 10-24-1940 Sex: male Admission Date (Current Location): 06/25/2018  [facility] and [place] Number:  [employer] and Address:  [facility], [place] [address], [place], [place] [address]      Provider Number: [id number]  Attending Physician Name and Address:  Thurnell Lose, MD  Relative Name and Phone Number:  Santiago Glad niece, [phone]    Current Level of Care: Hospital Recommended Level of Care: [place] Prior Approval Number:    Date Approved/Denied:   PASRR Number: [id number]  Discharge Plan: SNF    Current Diagnoses: Patient Active Problem List   Diagnosis Date Noted  . Protein-calorie malnutrition, severe 06/26/2018  . Alcohol withdrawal seizure ([place]) 06/25/2018  . New onset seizure ([place]) 01/07/2018  . Hypertension 01/07/2018  . GERD (gastroesophageal reflux disease) 01/07/2018  . Hyperlipidemia 01/07/2018  . Chronic obstructive pulmonary disease (COPD) ([place]) 01/07/2018  . Cigarette smoker 01/07/2018  . Alcohol abuse 01/07/2018  . Acute alcohol abuse, with delirium ([place])   . Acute encephalopathy 10/14/2014  . Unsteady gait 10/14/2014  . Alcohol withdrawal delirium ([place]) 10/14/2014  . Lipoma of neck 12/22/2012  . Cerebral atrophy ([place]) 09/28/2012  . Cerebrovascular small vessel disease 09/28/2012  . Left humeral fracture 07/04/2012  . Delirium 07/04/2012  . ETOH abuse   . ELEVATED PROSTATE SPECIFIC ANTIGEN 09/30/2010  . COPD (chronic obstructive pulmonary disease) ([place]) 01/28/2009  . PULMONARY NODULE 01/28/2009  . CIGARETTE SMOKER 02/07/2008  . INSOMNIA, CHRONIC 10/15/2007  . COLONIC POLYPS 10/10/2007  . Type 2 diabetes mellitus ([place]) 10/10/2007  . Dyslipidemia 10/10/2007  . Essential hypertension 10/10/2007  . GERD 10/10/2007  . DIVERTICULOSIS OF COLON 10/10/2007    Orientation RESPIRATION  BLADDER Height & Weight     Self, Time, Situation, Place  Normal External catheter, Continent Weight: 71.7 kg Height:     BEHAVIORAL SYMPTOMS/MOOD NEUROLOGICAL BOWEL NUTRITION STATUS      Continent Diet(Please see DC Summary)  AMBULATORY STATUS COMMUNICATION OF NEEDS Skin   Limited Assist Verbally Normal                       Personal Care Assistance Level of Assistance  Bathing, Feeding, Dressing Bathing Assistance: Maximum assistance Feeding assistance: Independent Dressing Assistance: Limited assistance     Functional Limitations Info  Sight, Hearing, Speech Sight Info: Adequate Hearing Info: Adequate Speech Info: Adequate    SPECIAL CARE FACTORS FREQUENCY  PT (By licensed PT), OT (By licensed OT)     PT Frequency: 5x/week OT Frequency: 3x/week            Contractures Contractures Info: Not present    Additional Factors Info  Code Status, Allergies, Psychotropic Code Status Info: Full Allergies Info: NKA Psychotropic Info: Librium         Current Medications (06/28/2018):  This is the current hospital active medication list Current Facility-Administered Medications  Medication Dose Route Frequency Provider Last Rate Last Dose  . acetaminophen (TYLENOL) tablet 650 mg  650 mg Oral Q6H PRN Irene Pap N, DO      . aspirin EC tablet 81 mg  81 mg Oral Daily Thurnell Lose, MD   81 mg at 06/28/18 0939  . chlordiazePOXIDE (LIBRIUM) capsule 10 mg  10 mg Oral TID Thurnell Lose, MD   10 mg at 06/28/18 0939  . feeding supplement (ENSURE ENLIVE) (ENSURE  ENLIVE) liquid 237 mL  237 mL Oral BID BM Thurnell Lose, MD   237 mL at 06/28/18 0940  . folic acid (FOLVITE) tablet 1 mg  1 mg Oral Daily Irene Pap N, DO   1 mg at 06/28/18 3403  . gemfibrozil (LOPID) tablet 600 mg  600 mg Oral BID Thurnell Lose, MD   600 mg at 06/28/18 0939  . heparin injection 5,000 Units  5,000 Units Subcutaneous Q8H Thurnell Lose, MD   5,000 Units at 06/27/18 2217  .  levETIRAcetam (KEPPRA) tablet 500 mg  500 mg Oral BID Thurnell Lose, MD   500 mg at 06/28/18 0939  . LORazepam (ATIVAN) injection 0-4 mg  0-4 mg Intravenous Q12H Murray, Alyssa B, PA-C       Or  . LORazepam (ATIVAN) tablet 0-4 mg  0-4 mg Oral Q12H Murray, Alyssa B, PA-C      . LORazepam (ATIVAN) injection 2-3 mg  2-3 mg Intravenous Q1H PRN Irene Pap N, DO   2 mg at 06/27/18 2216  . multivitamin with minerals tablet 1 tablet  1 tablet Oral Daily Irene Pap N, DO   1 tablet at 06/28/18 5248  . ondansetron (ZOFRAN) injection 4 mg  4 mg Intravenous Q6H PRN Kayleen Memos, DO      . [START ON 06/30/2018] thiamine (VITAMIN B-1) tablet 100 mg  100 mg Oral Daily Marliss Coots, PA-C         Discharge Medications: Please see discharge summary for a list of discharge medications.  Relevant Imaging Results:  Relevant Lab Results:   Additional Information SSN: [place], [place]

## 2018-06-28 NOTE — Progress Notes (Signed)
@IPLOG @        PROGRESS NOTE                                                                                                                                                                                                             Patient Demographics:    Blake [name], is a 77 y.o. male, DOB - July 19, 1941, WUJ:[id number]  Admit date - 06/25/2018   Admitting Physician Kayleen Memos, DO  Outpatient Primary MD for the patient is Nche, Charlene Brooke, NP  LOS - 2  Chief Complaint  Patient presents with  . Altered Mental Status       Brief Narrative  Blake [name] is a 77 y.o. male with medical history significant for COPD, hypertension, pulmonary nodule, alcohol abuse, alcohol withdrawal induced seizures who presented to [facility] ED from home where he lives with friends due to uncontrollable left-sided shaking movements that started the morning of the day of admission.  Apparently he drinks about 6 to 12 bottles of beer daily and skipped alcoholic for a day, he does not recall why in the ER he was admitted with a running diagnosis of focal seizure mostly affecting his left side, CT head was unremarkable, neurologist Dr. Cheral Marker was consulted over the phone who recommended that his presentation was typical of his previous presentation not too long ago.  He was admitted for alcohol withdrawal seizures.   Subjective:   Patient in bed, appears comfortable, denies any headache, no fever, no chest pain or pressure, no shortness of breath , no abdominal pain. No focal weakness.   Assessment  & Plan :     1.  Seizure.  This likely was partial complex seizure, initially thought to be alcohol withdrawal, however symptoms did not match, he was kept in the hospital and seizure work-up was initiated which included CT brain, MRI and EEG which were all unremarkable.  Neuro was consulted.  He has now been placed on Keppra which will be continued, increase activity PT eval..  2.  History of heavy alcohol abuse  and smoking.  Counseled to quit all, placed on Librium along with CIWA protocol.  Monitor for DTs.  3.  Hypertension.  For now PRN Catapres.  4.  Dyslipidemia.  Placed on home dose statin.  5.  Deconditioning.  Advance activity.  PT eval.   Patient was switched from observation to inpatient as initially he was thought to be having alcohol withdrawal seizure however his symptoms did not match that of alcohol withdrawal hence a full  seizure work-up was initiated and neurology was called, he is now been placed on Keppra.  Continue treatment for potential DTs and deconditioning quiring PT.  Still quite frail.   Family Communication  :  None  Code Status :  Full  Disposition Plan  :  Med - surg, SNF in am  Consults  : Neurology  Procedures  :    CT head.  Nonacute.  MRI brain and EEG.  Both nonacute.  DVT Prophylaxis  :  Heparin   Lab Results  Component Value Date   PLT 218 06/26/2018    Diet :  Diet Order            DIET DYS 3 Room service appropriate? Yes; Fluid consistency: Thin  Diet effective now               Inpatient Medications Scheduled Meds: . aspirin EC  81 mg Oral Daily  . chlordiazePOXIDE  10 mg Oral TID  . feeding supplement (ENSURE ENLIVE)  237 mL Oral BID BM  . folic acid  1 mg Oral Daily  . gemfibrozil  600 mg Oral BID  . heparin injection (subcutaneous)  5,000 Units Subcutaneous Q8H  . levETIRAcetam  500 mg Oral BID  . LORazepam  0-4 mg Intravenous Q12H   Or  . LORazepam  0-4 mg Oral Q12H  . multivitamin with minerals  1 tablet Oral Daily  . [START ON 06/30/2018] thiamine  100 mg Oral Daily   Continuous Infusions:  PRN Meds:.acetaminophen, LORazepam, ondansetron (ZOFRAN) IV  Antibiotics  :   Anti-infectives (From admission, onward)   None        Objective:   Vitals:   06/27/18 2120 06/27/18 2252 06/28/18 0007 06/28/18 0543  BP: (!) 145/86 (!) 142/73 140/78 (!) 115/56  Pulse: 62 64 (!) 58 (!) 49  Resp: 20 20 20 19   Temp: 98.2 F  (36.8 C) 98.4 F (36.9 C) 97.9 F (36.6 C)   TempSrc: Oral Oral Oral   SpO2: 99% 99% 99% 100%  Weight:        Wt Readings from Last 3 Encounters:  06/26/18 71.7 kg  01/08/18 67.7 kg  08/27/16 68.5 kg     Intake/Output Summary (Last 24 hours) at 06/28/2018 1051 Last data filed at 06/28/2018 4098 Gross per 24 hour  Intake 480 ml  Output 1550 ml  Net -1070 ml     Physical Exam  Awake, mildly confused, No new F.N deficits, NoDTs  Blake [name].AT,PERRAL Supple Neck,No JVD, No cervical lymphadenopathy appriciated.  Symmetrical Chest wall movement, Good air movement bilaterally, CTAB RRR,No Gallops, Rubs or new Murmurs, No Parasternal Heave +ve B.Sounds, Abd Soft, No tenderness, No organomegaly appriciated, No rebound - guarding or rigidity. No Cyanosis, Clubbing or edema, No new Rash or bruise    Data Review:    CBC Recent Labs  Lab 06/25/18 1537 06/26/18 0521  WBC 7.6 10.1  HGB 15.7 14.5  HCT 47.8 44.0  PLT 214 218  MCV 105.8* 105.3*  MCH 34.7* 34.7*  MCHC 32.8 33.0  RDW 13.2 13.2  LYMPHSABS 1.2  --   MONOABS 0.6  --   EOSABS 0.1  --   BASOSABS 0.1  --     Chemistries  Recent Labs  Lab 06/25/18 1537 06/26/18 0521 06/27/18 0401  NA 138 138 137  K 4.4 3.8 3.9  CL 108 108 109  CO2 21* 23 24  GLUCOSE 93 89 110*  BUN 7* 7* 10  CREATININE 0.70 0.83  0.83  CALCIUM 8.7* 8.5* 8.3*  MG  --  2.0 1.8  AST 23 21  --   ALT 11 11  --   ALKPHOS 60 54  --   BILITOT 0.8 1.2  --    ------------------------------------------------------------------------------------------------------------------ No results for input(s): CHOL, HDL, LDLCALC, TRIG, CHOLHDL, LDLDIRECT in the last 72 hours.  Lab Results  Component Value Date   HGBA1C 4.5 (L) 08/27/2016   ------------------------------------------------------------------------------------------------------------------ No results for input(s): TSH, T4TOTAL, T3FREE, THYROIDAB in the last 72 hours.  Invalid input(s):  FREET3 ------------------------------------------------------------------------------------------------------------------ No results for input(s): VITAMINB12, FOLATE, FERRITIN, TIBC, IRON, RETICCTPCT in the last 72 hours.  Coagulation profile Recent Labs  Lab 06/25/18 1537  INR 0.97    No results for input(s): DDIMER in the last 72 hours.  Cardiac Enzymes No results for input(s): CKMB, TROPONINI, MYOGLOBIN in the last 168 hours.  Invalid input(s): CK ------------------------------------------------------------------------------------------------------------------ No results found for: BNP  Micro Results No results found for this or any previous visit (from the past 240 hour(s)).  Radiology Reports Dg Chest 2 View  Result Date: 06/25/2018 CLINICAL DATA:  Cough EXAM: CHEST - 2 VIEW COMPARISON:  10/14/2014 FINDINGS: There is no focal parenchymal opacity. There is no pleural effusion or pneumothorax. The heart and mediastinal contours are unremarkable. There is a healed old posttraumatic deformity of left proximal humerus. There is an age-indeterminate L1 vertebral body compression fracture. IMPRESSION: No active cardiopulmonary disease. Electronically Signed   By: Kathreen Devoid   On: 06/25/2018 16:31   Ct Head Wo Contrast  Result Date: 06/25/2018 CLINICAL DATA:  Left-sided weakness and altered mental status EXAM: CT HEAD WITHOUT CONTRAST TECHNIQUE: Contiguous axial images were obtained from the base of the skull through the vertex without intravenous contrast. COMPARISON:  January 07, 2018 FINDINGS: Brain: Moderate diffuse atrophy is stable. There is no intracranial mass, hemorrhage, extra-axial fluid collection, or midline shift. There is patchy small vessel disease in the centra semiovale bilaterally. No new brain parenchymal lesions evident. No acute infarct is appreciable. Vascular: No hyperdense vessels evident. There is calcification in each carotid siphon region. Skull: Bony calvarium  appears intact. Sinuses/Orbits: There is a small retention cyst in the inferomedial left maxillary antrum. There is mucosal thickening in several ethmoid air cells bilaterally. Postoperative changes are noted in the left lateral orbital wall and anterior left maxillary antrum. Orbits appear symmetric bilaterally. There is evidence of previous cataract removals bilaterally. Other: Mastoid air cells are clear. There is debris in each external auditory canal. IMPRESSION: Atrophy with patchy periventricular small vessel disease. No evident acute infarct. No mass or hemorrhage. There are foci of arterial vascular calcification. Foci of paranasal sinus disease noted with areas of postoperative change in the left facial region. There is probable cerumen in each external auditory canal. Electronically Signed   By: Lowella Grip III M.D.   On: 06/25/2018 16:01   Mr Brain Wo Contrast  Result Date: 06/27/2018 CLINICAL DATA:  77 year old male with left side weakness and altered mental status. EXAM: MRI HEAD WITHOUT CONTRAST TECHNIQUE: Multiplanar, multiecho pulse sequences of the brain and surrounding structures were obtained without intravenous contrast. COMPARISON:  Head CT 06/25/2018.  Brain MRI 10/15/2014. FINDINGS: Brain: Stable cerebral volume since the prior MRI. No restricted diffusion to suggest acute infarction. No midline shift, mass effect, evidence of mass lesion, ventriculomegaly, extra-axial collection or acute intracranial hemorrhage. Cervicomedullary junction and pituitary are within normal limits. Stable gray-white matter differentiation throughout the brain. Prominent mineralization of the bilateral basal ganglia. Mild  for age patchy cerebral white matter T2 and FLAIR hyperintensity. No cortical encephalomalacia or definite chronic cerebral blood products identified. No new signal abnormality. Vascular: Major intracranial vascular flow voids are stable since 2016. Skull and upper cervical spine:  Chronic ligamentous hypertrophy about the odontoid. Normal visible bone marrow signal. Sinuses/Orbits: Stable and negative. Other: Mastoid air cells remain clear. Visible internal auditory structures appear normal. Scalp and face soft tissues appear negative. IMPRESSION: No acute intracranial abnormality and stable MRI appearance of the brain since 2016. Electronically Signed   By: Genevie Ann M.D.   On: 06/27/2018 08:24    Time Spent in minutes  30   Lala Lund M.D on 06/28/2018 at 10:51 AM  To page go to [email or web address] - password [facility]

## 2018-06-29 MED ORDER — ENSURE ENLIVE PO LIQD: 237.0000 mL | Freq: Two times a day (BID) | ORAL | 12 refills | Status: DC

## 2018-06-29 MED ORDER — CHLORDIAZEPOXIDE HCL 5 MG PO CAPS: ORAL_CAPSULE | ORAL | 0 refills | Status: DC

## 2018-06-29 MED ORDER — LEVETIRACETAM 500 MG PO TABS: 500.0000 mg | ORAL_TABLET | Freq: Two times a day (BID) | ORAL | Status: DC

## 2018-06-29 NOTE — Progress Notes (Signed)
Physical Therapy Treatment Patient Details Name: Blake [name] MRN: [id number] DOB: [birth year] Today's Date: 06/29/2018    History of Present Illness Pt is a 77 y.o. male admitted 06/25/18 with left-sided weakness and AMS; admitted for alcohol withdrawal seizures. MRI showed no acute intracranial abnormality. EEG with no epileptiform activity noted. PMH significant for EtOH abuse, previous alcohol withdrawal seizure, COPD, GERD, HTN, HLD, pulmonary nodule, and CVA.   PT Comments    Pt slowly progressing with mobility. Continues to demonstrate poor insight into deficits, decreased safety awareness, and impaired balance, requiring assist to prevent repeated losses of balance with ambulation and dynamic standing tasks. Pt remains motivated to participate. Continue to recommend SNF-level therapies.    Follow Up Recommendations  SNF;Supervision/Assistance - 24 hour     Equipment Recommendations  None recommended by PT    Recommendations for Other Services       Precautions / Restrictions Precautions Precautions: Fall Restrictions Weight Bearing Restrictions: No    Mobility  Bed Mobility               General bed mobility comments: Seated EOB finishing breakfast  Transfers Overall transfer level: Needs assistance Equipment used: Rolling walker (2 wheeled) Transfers: Sit to/from Stand Sit to Stand: Min assist         General transfer comment: Able to stand with min guard, requiring minA to prevent posterior LOB balance once standing  Ambulation/Gait Ambulation/Gait assistance: Mod assist;Min guard   Assistive device: Rolling walker (2 wheeled) Gait Pattern/deviations: Shuffle;Staggering right;Staggering left;Trunk flexed Gait velocity: Decreased Gait velocity interpretation: <1.8 ft/sec, indicate of risk for recurrent falls General Gait Details: Poor safety awareness throughout. Pt stopping to reach for something outside of BOS requiring modA to prevent anterior LOB,  then drinking from cup and required modA to prevent posterior LOB; cues for correction. Slow, shuffling amb, able to lengthen stride intermittently with repeated cues   Stairs             Wheelchair Mobility    Modified Rankin (Stroke Patients Only)       Balance Overall balance assessment: Needs assistance;History of Falls Sitting-balance support: Feet supported;No upper extremity supported Sitting balance-Leahy Scale: Fair     Standing balance support: During functional activity Standing balance-Leahy Scale: Poor Standing balance comment: Requires external support for dynamic standing tasks                            Cognition Arousal/Alertness: Awake/alert Behavior During Therapy: WFL for tasks assessed/performed Overall Cognitive Status: Impaired/Different from baseline Area of Impairment: Attention;Memory;Following commands;Safety/judgement;Awareness;Problem solving                   Current Attention Level: Selective Memory: Decreased Wishart-term memory Following Commands: Follows multi-step commands inconsistently;Follows one step commands with increased time Safety/Judgement: Decreased awareness of safety;Decreased awareness of deficits Awareness: Emergent Problem Solving: Slow processing;Requires verbal cues General Comments: Very poor awareness throughout session      Exercises      General Comments        Pertinent Vitals/Pain Pain Assessment: No/denies pain    Home Living                      Prior Function            PT Goals (current goals can now be found in the care plan section) Acute Rehab PT Goals Patient Stated Goal: Agreeable to SNF prior to returning  home PT Goal Formulation: With patient Time For Goal Achievement: 07/10/18 Potential to Achieve Goals: Fair Progress towards PT goals: Progressing toward goals    Frequency    Min 3X/week      PT Plan Current plan remains appropriate     Co-evaluation              AM-PAC PT "6 Clicks" Daily Activity  Outcome Measure  Difficulty turning over in bed (including adjusting bedclothes, sheets and blankets)?: A Little Difficulty moving from lying on back to sitting on the side of the bed? : A Little Difficulty sitting down on and standing up from a chair with arms (e.g., wheelchair, bedside commode, etc,.)?: Unable Help needed moving to and from a bed to chair (including a wheelchair)?: A Little Help needed walking in hospital room?: A Little Help needed climbing 3-5 steps with a railing? : A Lot 6 Click Score: 15    End of Session Equipment Utilized During Treatment: Gait belt Activity Tolerance: Patient tolerated treatment well Patient left: in chair;with call bell/phone within reach;with chair alarm set Nurse Communication: Mobility status PT Visit Diagnosis: Unsteadiness on feet (R26.81);Muscle weakness (generalized) (M62.81)     Time: 1032-1100 PT Time Calculation (min) (ACUTE ONLY): 28 min  Charges:  $Gait Training: 8-22 mins $Therapeutic Activity: 8-22 mins                    Mabeline Caras, PT, DPT Acute Rehabilitation Services  Pager [phone] Office [place] 06/29/2018, 11:52 AM

## 2018-06-29 NOTE — Progress Notes (Signed)
Marissa Nestle Kadrmas to be D/C'd Skilled nursing facility per MD order.  Discussed prescriptions and follow up appointments with the patient. Prescriptions given to patient, medication list explained in detail. Pt verbalized understanding. Report called to accepting RN at [facility] and all questions reviewed and answered.  Allergies as of 06/29/2018   No Known Allergies     Medication List    STOP taking these medications   losartan 50 MG tablet Commonly known as:  COZAAR   oxyCODONE 5 MG immediate release tablet Commonly known as:  Oxy IR/ROXICODONE   temazepam 30 MG capsule Commonly known as:  RESTORIL     TAKE these medications   aspirin EC 81 MG tablet Take 1 tablet (81 mg total) by mouth daily.   chlordiazePOXIDE 5 MG capsule Commonly known as:  LIBRIUM Please dispense 18 pills - Take 1 pill three times a day for 3 days, then Take 1 pill two times a day for 3 days, then Take 1 pill once a day for 3 days and stop.   cholecalciferol 1000 units tablet Commonly known as:  VITAMIN D Take 1 tablet (1,000 Units total) by mouth daily.   feeding supplement (ENSURE ENLIVE) Liqd Take 237 mLs by mouth 2 (two) times daily between meals.   folic acid 1 MG tablet Commonly known as:  FOLVITE Take 1 tablet (1 mg total) by mouth daily.   gemfibrozil 600 MG tablet Commonly known as:  LOPID Take 1 tablet (600 mg total) by mouth 2 (two) times daily.   levETIRAcetam 500 MG tablet Commonly known as:  KEPPRA Take 1 tablet (500 mg total) by mouth 2 (two) times daily.   thiamine 100 MG tablet Take 1 tablet (100 mg total) by mouth daily.       Vitals:   06/29/18 0017 06/29/18 0524  BP: 124/67 122/77  Pulse: 79 80  Resp: 16 (!) 22  Temp: 99.5 F (37.5 C) 98.8 F (37.1 C)  SpO2: 93% 94%    Skin clean, dry and intact without evidence of skin break down, no evidence of skin tears noted. Pt denies pain at this time. No complaints noted.  An After Visit Summary was printed and given to  the patient. Patient escorted via stretcher with PTAR.  Chapman Fitch BSN, RN [employer] [address] Phone [phone]

## 2018-06-29 NOTE — Clinical Social Work Note (Signed)
Clinical Social Work Assessment  Patient Details  Name: Blake [name] MRN: [id number] Date of Birth: 04-21-1941  Date of referral:  06/29/18               Reason for consult:  Facility Placement                Permission sought to share information with:  Facility Sport and exercise psychologist, Family Supports Permission granted to share information::  Yes, Verbal Permission Granted  Name::        Agency::  SNFs  Relationship::     Contact Information:     Housing/Transportation Living arrangements for the past 2 months:  Single Family Home Source of Information:  Patient Patient Interpreter Needed:  None Criminal Activity/Legal Involvement Pertinent to Current Situation/Hospitalization:  No - Comment as needed Significant Relationships:  Other Family Members Lives with:  Self Do you feel safe going back to the place where you live?  No Need for family participation in patient care:  No (Coment)  Care giving concerns:  CSW received consult for possible SNF placement at time of discharge. CSW spoke with patient regarding PT recommendation of SNF placement at time of discharge. Patient reported that he has no support at home. Patient expressed understanding of PT recommendation and is agreeable to SNF placement at time of discharge. CSW to continue to follow and assist with discharge planning needs.   Social Worker assessment / plan:  CSW spoke with patient concerning possibility of rehab at [facility] before returning home.  Employment status:  Retired Forensic scientist:  Medicare PT Recommendations:  Mobile / Referral to community resources:  [place]  Patient/Family's Response to care:  Patient recognizes need for rehab before returning home and is agreeable to a SNF in [place]. Patient reported preference for [facility] since it is close to the hospital. He did not like the options in [place].  Patient/Family's Understanding of and  Emotional Response to Diagnosis, Current Treatment, and Prognosis:  Patient/family is realistic regarding therapy needs and expressed being hopeful for SNF placement. Patient expressed understanding of CSW role and discharge process as well as medical condition. No questions/concerns about plan or treatment.    Emotional Assessment Appearance:  Appears stated age Attitude/Demeanor/Rapport:  Guarded Affect (typically observed):  Accepting, Appropriate Orientation:  Oriented to Self, Oriented to Place, Oriented to  Time, Oriented to Situation Alcohol / Substance use:  Alcohol Use Psych involvement (Current and /or in the community):  No (Comment)  Discharge Needs  Concerns to be addressed:  Care Coordination Readmission within the last 30 days:  No Current discharge risk:  None Barriers to Discharge:  No Barriers Identified   Benard Halsted, LCSW 06/29/2018, 11:00 AM

## 2018-06-29 NOTE — Progress Notes (Signed)
Patient will DC to: [facility] Anticipated DC date: 06/29/18 Family notified: Patient declined Transport by: PTAR 12pm   Per MD patient ready for DC to [facility]. RN, patient, patient's family, and facility notified of DC. Discharge Summary and FL2 sent to facility. RN to call report prior to discharge [phone]). DC packet on chart. Ambulance transport requested for patient.   CSW will sign off for now as social work intervention is no longer needed. Please consult [place] again if new needs arise.  Cedric Fishman, LCSW Clinical Social Worker [phone]

## 2018-06-29 NOTE — Plan of Care (Signed)

## 2018-06-29 NOTE — Discharge Summary (Signed)
Blake [name]:[id number] DOB: 09-Feb-1941 DOA: 06/25/2018  PCP: Flossie Buffy, NP  Admit date: 06/25/2018  Discharge date: 06/29/2018  Admitted From: Home  Disposition:  SNF   Recommendations for Outpatient Follow-up:   Follow up with PCP in 1-2 weeks  PCP Please obtain BMP/CBC, 2 view CXR in 1week,  (see Discharge instructions)   PCP Please follow up on the following pending results:    Home Health: None   Equipment/Devices: None  Consultations: Neuro Discharge Condition: Fair   CODE STATUS: Full   Diet Recommendation: Dysphagia 3 with feeding assistance and aspiration precautions  Diet Order            DIET DYS 3 Room service appropriate? Yes; Fluid consistency: Thin  Diet effective now               Chief Complaint  Patient presents with  . Altered Mental Status     Brief history of present illness from the day of admission and additional interim summary    Blake [name] a 77 y.o.malewith medical history significant forCOPD, hypertension, pulmonary nodule, alcohol abuse,alcohol withdrawal induced seizures who presented to [facility] ED from home where he lives with friends due to uncontrollable left-sided shakingmovementsthat started the morning of the day of admission.  Apparently he drinks about 6 to 12 bottles of beer daily and skipped alcoholic for a day, he does not recall why in the ER he was admitted with a running diagnosis of focal seizure mostly affecting his left side, CT head was unremarkable, neurologist Dr. Cheral Marker was consulted over the phone who recommended that his presentation was typical of his previous presentation not too long ago.  He was admitted for seizures.                                                                 Hospital Course    1.  Seizure.  This likely  was partial complex seizure, initially thought to be alcohol withdrawal, however symptoms did not match, he was kept in the hospital and seizure work-up was initiated which included CT brain, MRI and EEG which were all unremarkable.  Neuro was consulted.  He has now been placed on Keppra which will be continued, he has some generalized deconditioning and weakness and does not have good living arrangements, seen by PT and will be discharged to SNF today if bed is available.  He is now close to his baseline and feels well no subjective complaints.  2.  History of heavy alcohol abuse and smoking.  Counseled to quit all, signs of withdrawal or DTs, gentle Librium taper, continue folic acid and thiamine at SNF.  3.  Hypertension.  Pressure stable here without any medications on board.  4.  Dyslipidemia.  Placed on home dose statin.  5.  Deconditioning.    PT eval will require SNF placement.  6.  Dysphasia due to deconditioning.  On dysphagia 3 diet and rapidly improving, have speech therapy to evaluate the patient in 1 to 2 days and advance to regular consistency heart healthy if tolerated.   Discharge diagnosis     Active Problems:   CIGARETTE SMOKER   Essential hypertension   Cerebral atrophy (HCC)   Hyperlipidemia   Chronic obstructive pulmonary disease (COPD) (HCC)   Alcohol withdrawal seizure ([place])   Protein-calorie malnutrition, severe    Discharge instructions    Discharge Instructions    Discharge instructions   Complete by:  As directed    Follow with Primary MD Nche, Charlene Brooke, NP in 7 days   Get CBC, CMP, 2 view Chest X ray checked  by Primary MD or SNF MD in 5-7 days   Activity: As tolerated with Full fall precautions use walker/cane & assistance as needed  Disposition Home    Diet: Dysphagia 3 diet with feeding assistance and aspiration precautions.  Speech therapy to reevaluate and advance as tolerated.  Special Instructions: If you have smoked or chewed  Tobacco  in the last 2 yrs please stop smoking, stop any regular Alcohol  and or any Recreational drug use.  On your next visit with your primary care physician please Get Medicines reviewed and adjusted.  Please request your Prim.MD to go over all Hospital Tests and Procedure/Radiological results at the follow up, please get all Hospital records sent to your Prim MD by signing hospital release before you go home.  If you experience worsening of your admission symptoms, develop shortness of breath, life threatening emergency, suicidal or homicidal thoughts you must seek medical attention immediately by calling 911 or calling your MD immediately  if symptoms less severe.  You Must read complete instructions/literature along with all the possible adverse reactions/side effects for all the Medicines you take and that have been prescribed to you. Take any new Medicines after you have completely understood and accpet all the possible adverse reactions/side effects.   Increase activity slowly   Complete by:  As directed       Discharge Medications   Allergies as of 06/29/2018   No Known Allergies     Medication List    STOP taking these medications   losartan 50 MG tablet Commonly known as:  COZAAR   oxyCODONE 5 MG immediate release tablet Commonly known as:  Oxy IR/ROXICODONE   temazepam 30 MG capsule Commonly known as:  RESTORIL     TAKE these medications   aspirin EC 81 MG tablet Take 1 tablet (81 mg total) by mouth daily.   chlordiazePOXIDE 5 MG capsule Commonly known as:  LIBRIUM Please dispense 18 pills - Take 1 pill three times a day for 3 days, then Take 1 pill two times a day for 3 days, then Take 1 pill once a day for 3 days and stop.   cholecalciferol 1000 units tablet Commonly known as:  VITAMIN D Take 1 tablet (1,000 Units total) by mouth daily.   feeding supplement (ENSURE ENLIVE) Liqd Take 237 mLs by mouth 2 (two) times daily between meals.   folic acid 1 MG  tablet Commonly known as:  FOLVITE Take 1 tablet (1 mg total) by mouth daily.   gemfibrozil 600 MG tablet Commonly known as:  LOPID Take 1 tablet (600 mg total) by mouth 2 (two) times daily.   levETIRAcetam 500 MG tablet Commonly known  as:  KEPPRA Take 1 tablet (500 mg total) by mouth 2 (two) times daily.   thiamine 100 MG tablet Take 1 tablet (100 mg total) by mouth daily.       Follow-up Information    Nche, Charlene Brooke, NP. Schedule an appointment as soon as possible for a visit in 1 week(s).   Specialty:  Internal Medicine Contact information: [address] [place] [address] [phone]           Major procedures and Radiology Reports - PLEASE review detailed and final reports thoroughly  -         Dg Chest 2 View  Result Date: 06/25/2018 CLINICAL DATA:  Cough EXAM: CHEST - 2 VIEW COMPARISON:  10/14/2014 FINDINGS: There is no focal parenchymal opacity. There is no pleural effusion or pneumothorax. The heart and mediastinal contours are unremarkable. There is a healed old posttraumatic deformity of left proximal humerus. There is an age-indeterminate L1 vertebral body compression fracture. IMPRESSION: No active cardiopulmonary disease. Electronically Signed   By: Kathreen Devoid   On: 06/25/2018 16:31   Ct Head Wo Contrast  Result Date: 06/25/2018 CLINICAL DATA:  Left-sided weakness and altered mental status EXAM: CT HEAD WITHOUT CONTRAST TECHNIQUE: Contiguous axial images were obtained from the base of the skull through the vertex without intravenous contrast. COMPARISON:  January 07, 2018 FINDINGS: Brain: Moderate diffuse atrophy is stable. There is no intracranial mass, hemorrhage, extra-axial fluid collection, or midline shift. There is patchy small vessel disease in the centra semiovale bilaterally. No new brain parenchymal lesions evident. No acute infarct is appreciable. Vascular: No hyperdense vessels evident. There is calcification in each carotid siphon  region. Skull: Bony calvarium appears intact. Sinuses/Orbits: There is a small retention cyst in the inferomedial left maxillary antrum. There is mucosal thickening in several ethmoid air cells bilaterally. Postoperative changes are noted in the left lateral orbital wall and anterior left maxillary antrum. Orbits appear symmetric bilaterally. There is evidence of previous cataract removals bilaterally. Other: Mastoid air cells are clear. There is debris in each external auditory canal. IMPRESSION: Atrophy with patchy periventricular small vessel disease. No evident acute infarct. No mass or hemorrhage. There are foci of arterial vascular calcification. Foci of paranasal sinus disease noted with areas of postoperative change in the left facial region. There is probable cerumen in each external auditory canal. Electronically Signed   By: Lowella Grip III M.D.   On: 06/25/2018 16:01   Mr Brain Wo Contrast  Result Date: 06/27/2018 CLINICAL DATA:  77 year old male with left side weakness and altered mental status. EXAM: MRI HEAD WITHOUT CONTRAST TECHNIQUE: Multiplanar, multiecho pulse sequences of the brain and surrounding structures were obtained without intravenous contrast. COMPARISON:  Head CT 06/25/2018.  Brain MRI 10/15/2014. FINDINGS: Brain: Stable cerebral volume since the prior MRI. No restricted diffusion to suggest acute infarction. No midline shift, mass effect, evidence of mass lesion, ventriculomegaly, extra-axial collection or acute intracranial hemorrhage. Cervicomedullary junction and pituitary are within normal limits. Stable gray-white matter differentiation throughout the brain. Prominent mineralization of the bilateral basal ganglia. Mild for age patchy cerebral white matter T2 and FLAIR hyperintensity. No cortical encephalomalacia or definite chronic cerebral blood products identified. No new signal abnormality. Vascular: Major intracranial vascular flow voids are stable since 2016. Skull  and upper cervical spine: Chronic ligamentous hypertrophy about the odontoid. Normal visible bone marrow signal. Sinuses/Orbits: Stable and negative. Other: Mastoid air cells remain clear. Visible internal auditory structures appear normal. Scalp and face soft tissues  appear negative. IMPRESSION: No acute intracranial abnormality and stable MRI appearance of the brain since 2016. Electronically Signed   By: Genevie Ann M.D.   On: 06/27/2018 08:24    Micro Results     No results found for this or any previous visit (from the past 240 hour(s)).  Today   Subjective    Breyon Sigg today has no headache,no chest abdominal pain,no new weakness tingling or numbness, feels much better    Objective   Blood pressure 122/77, pulse 80, temperature 98.8 F (37.1 C), resp. rate (!) 22, weight 71.7 kg, SpO2 94 %.   Intake/Output Summary (Last 24 hours) at 06/29/2018 0826 Last data filed at 06/29/2018 0514 Gross per 24 hour  Intake 720 ml  Output 800 ml  Net -80 ml    Exam  Awake Alert,  No new F.N deficits, Normal affect [place].AT,PERRAL Supple Neck,No JVD, No cervical lymphadenopathy appriciated.  Symmetrical Chest wall movement, Good air movement bilaterally, CTAB RRR,No Gallops,Rubs or new Murmurs, No Parasternal Heave +ve B.Sounds, Abd Soft, Non tender, No organomegaly appriciated, No rebound -guarding or rigidity. No Cyanosis, Clubbing or edema, No new Rash or bruise   Data Review   CBC w Diff:  Lab Results  Component Value Date   WBC 10.1 06/26/2018   HGB 14.5 06/26/2018   HCT 44.0 06/26/2018   PLT 218 06/26/2018   LYMPHOPCT 16 06/25/2018   MONOPCT 7 06/25/2018   EOSPCT 1 06/25/2018   BASOPCT 1 06/25/2018    CMP:  Lab Results  Component Value Date   NA 137 06/27/2018   K 3.9 06/27/2018   CL 109 06/27/2018   CO2 24 06/27/2018   BUN 10 06/27/2018   CREATININE 0.83 06/27/2018   PROT 5.4 (L) 06/26/2018   ALBUMIN 3.3 (L) 06/26/2018   BILITOT 1.2 06/26/2018   ALKPHOS 54  06/26/2018   AST 21 06/26/2018   ALT 11 06/26/2018  .   Total Time in preparing paper work, data evaluation and todays exam - 75 minutes  Lala Lund M.D on 06/29/2018 at 8:26 AM  [facility]   Office  [phone]

## 2018-06-29 NOTE — Discharge Instructions (Signed)
Follow with Primary MD Nche, Charlene Brooke, NP in 7 days   Get CBC, CMP, 2 view Chest X ray checked  by Primary MD or SNF MD in 5-7 days   Activity: As tolerated with Full fall precautions use walker/cane & assistance as needed  Disposition Home    Diet: Dysphagia 3 diet with feeding assistance and aspiration precautions.  Speech therapy to reevaluate and advance as tolerated.  Special Instructions: If you have smoked or chewed Tobacco  in the last 2 yrs please stop smoking, stop any regular Alcohol  and or any Recreational drug use.  On your next visit with your primary care physician please Get Medicines reviewed and adjusted.  Please request your Prim.MD to go over all Hospital Tests and Procedure/Radiological results at the follow up, please get all Hospital records sent to your Prim MD by signing hospital release before you go home.  If you experience worsening of your admission symptoms, develop shortness of breath, life threatening emergency, suicidal or homicidal thoughts you must seek medical attention immediately by calling 911 or calling your MD immediately  if symptoms less severe.  You Must read complete instructions/literature along with all the possible adverse reactions/side effects for all the Medicines you take and that have been prescribed to you. Take any new Medicines after you have completely understood and accpet all the possible adverse reactions/side effects.

## 2018-06-29 NOTE — Clinical Social Work Placement (Signed)
   CLINICAL SOCIAL WORK PLACEMENT  NOTE  Date:  06/29/2018  Patient Details  Name: Blake [name] MRN: [id number] Date of Birth: 1941/05/10  Clinical Social Work is seeking post-discharge placement for this patient at the [place] level of care (*CSW will initial, date and re-position this form in  chart as items are completed):  Yes   Patient/family provided with [place] Work Department's list of facilities offering this level of care within the geographic area requested by the patient (or if unable, by the patient's family).  Yes   Patient/family informed of their freedom to choose among providers that offer the needed level of care, that participate in Medicare, Medicaid or managed care program needed by the patient, have an available bed and are willing to accept the patient.  Yes   Patient/family informed of [facility]'s ownership interest in [facility] and [facility], as well as of the fact that they are under no obligation to receive care at these facilities.  PASRR submitted to EDS on       PASRR number received on       Existing PASRR number confirmed on 06/28/18     FL2 transmitted to all facilities in geographic area requested by pt/family on 06/28/18     FL2 transmitted to all facilities within larger geographic area on       Patient informed that his/her managed care company has contracts with or will negotiate with certain facilities, including the following:        Yes   Patient/family informed of bed offers received.  Patient chooses bed at [facility] recommends and patient chooses bed at      Patient to be transferred to [facility] on 06/29/18.  Patient to be transferred to facility by [facility]     Patient family notified on 06/29/18 of transfer.  Name of family member notified:  Patient declined     PHYSICIAN Please sign FL2, Please prepare priority discharge summary,  including medications     Additional Comment:    _______________________________________________ Benard Halsted, LCSW 06/29/2018, 11:01 AM

## 2018-06-30 ENCOUNTER — Encounter: Payer: Self-pay | Admitting: Adult Health

## 2018-06-30 ENCOUNTER — Non-Acute Institutional Stay (SKILLED_NURSING_FACILITY): Payer: Medicare Other | Admitting: Adult Health

## 2018-06-30 DIAGNOSIS — E559 Vitamin D deficiency, unspecified: Secondary | ICD-10-CM

## 2018-06-30 DIAGNOSIS — E785 Hyperlipidemia, unspecified: Secondary | ICD-10-CM

## 2018-06-30 DIAGNOSIS — I10 Essential (primary) hypertension: Secondary | ICD-10-CM | POA: Diagnosis not present

## 2018-06-30 DIAGNOSIS — G40909 Epilepsy, unspecified, not intractable, without status epilepticus: Secondary | ICD-10-CM | POA: Diagnosis not present

## 2018-06-30 DIAGNOSIS — F1010 Alcohol abuse, uncomplicated: Secondary | ICD-10-CM | POA: Diagnosis not present

## 2018-06-30 DIAGNOSIS — R5381 Other malaise: Secondary | ICD-10-CM

## 2018-06-30 DIAGNOSIS — B370 Candidal stomatitis: Secondary | ICD-10-CM

## 2018-06-30 DIAGNOSIS — R1310 Dysphagia, unspecified: Secondary | ICD-10-CM

## 2018-06-30 NOTE — Progress Notes (Signed)
Location:  [place] Room Number: 630-Z Place of Service:  SNF (31) Provider:  Durenda Age, NP  Patient Care Team: Nche, Charlene Brooke, NP as PCP - General (Internal Medicine)  Extended Emergency Contact Information Primary Emergency Contact: Alvira Monday [place] Mobile Phone: [phone] Relation: Niece Secondary Emergency Contact: Tamala Fothergill [place] Mobile Phone: [phone] Relation: Other  Code Status:  Full Code  Goals of care: Advanced Directive information Advanced Directives 06/29/2018  Does Patient Have a Medical Advance Directive? No  Type of Advance Directive -  Copy of [place] in Chart? -  Would patient like information on creating a medical advance directive? No - Patient declined  Pre-existing out of facility DNR order (yellow form or pink MOST form) -     Chief Complaint  Patient presents with  . Acute Visit    Hospital followup, status post hospitalization at [facility] 9/29-10/3/19 for seizure    HPI:  Pt is a 77 y.o. male seen today for hospital followup.  He was admitted to [place] for [place]-term rehabilitation, status post hospitalization at [facility] 9/29-10/3/19 for altered mental status.  He has a PMH of alcohol withdrawal seizures, alcohol abuse, COPD, hypertension, and a pulmonary nodule.  He had uncontrollable left-sided shaking movements and was brought to the hospital by his friends who lives with him.  He has been drinking 6-12 bottles of beer daily and skip alcohol for a day.  CT head was unremarkable.  Neurologist, Dr. Cheral Marker, was consulted over the phone who recommended that his presentation was typical of his previous presentation not too long ago.  Seizure was thought to be due to alcohol withdrawal.  He was started on Keppra.  He was seen in his room today.  Noted to have thick whitish coating on his tongue.  He denies oral  pain.   Past Medical History:  Diagnosis Date  . Chronic insomnia   . Cigarette smoker   . COPD (chronic obstructive pulmonary disease) ([place])   . Diverticulosis of colon   . Elevated prostate specific antigen (PSA)   . ETOH abuse   . GERD (gastroesophageal reflux disease)   . Hx of colonic polyps   . Hyperlipidemia   . Hypertension   . Pulmonary nodule   . Shortness of breath   . Stroke [facility])    Past Surgical History:  Procedure Laterality Date  . COLONOSCOPY    . needle biopsy RUL nodule  01/2009   benign  . TONSILLECTOMY  1947  . VASECTOMY      No Known Allergies  Outpatient Encounter Medications as of 06/30/2018  Medication Sig  . aspirin EC 81 MG tablet Take 1 tablet (81 mg total) by mouth daily.  . chlordiazePOXIDE (LIBRIUM) 5 MG capsule Please dispense 18 pills - Take 1 pill three times a day for 3 days, then Take 1 pill two times a day for 3 days, then Take 1 pill once a day for 3 days and stop.  . cholecalciferol (VITAMIN D) 1000 units tablet Take 1 tablet (1,000 Units total) by mouth daily.  . feeding supplement, ENSURE ENLIVE, (ENSURE ENLIVE) LIQD Take 237 mLs by mouth 2 (two) times daily between meals.  . folic acid (FOLVITE) 1 MG tablet Take 1 tablet (1 mg total) by mouth daily.  Marland Kitchen gemfibrozil (LOPID) 600 MG tablet Take 1 tablet (600 mg total) by mouth 2 (two) times daily.  Marland Kitchen levETIRAcetam (KEPPRA) 500 MG tablet  Take 1 tablet (500 mg total) by mouth 2 (two) times daily.  Marland Kitchen thiamine 100 MG tablet Take 1 tablet (100 mg total) by mouth daily.   No facility-administered encounter medications on file as of 06/30/2018.     Review of Systems  GENERAL: No change in appetite, no fatigue, no weight changes, no fever, chills  MOUTH and THROAT: Denies oral discomfort, gingival pain or bleeding RESPIRATORY: no cough, SOB, DOE, wheezing, hemoptysis CARDIAC: No chest pain, edema or palpitations GI: No abdominal pain, diarrhea, constipation, heart burn, nausea or  vomiting GU: Denies dysuria, frequency, hematuria, incontinence, or discharge PSYCHIATRIC: Denies feelings of depression or anxiety. No report of hallucinations, insomnia, paranoia, or agitation   Immunization History  Administered Date(s) Administered  . H1N1 10/22/2008  . Influenza Split 06/28/2011, 06/15/2012  . Influenza Whole 07/26/2007, 08/11/2010  . Influenza, High Dose Seasonal PF 08/27/2016  . Pneumococcal Polysaccharide-23 08/10/2010, 08/27/2016  . Tdap 06/15/2012, 05/08/2013, 10/14/2014   Pertinent  Health Maintenance Due  Topic Date Due  . OPHTHALMOLOGY EXAM  05/12/1951  . COLONOSCOPY  07/26/2016  . HEMOGLOBIN A1C  02/25/2017  . FOOT EXAM  08/27/2017  . URINE MICROALBUMIN  08/27/2017  . PNA vac Low Risk Adult (2 of 2 - PCV13) 08/27/2017  . INFLUENZA VACCINE  04/27/2018       Physical Exam  GENERAL APPEARANCE: Well nourished. In no acute distress. Normal body habitus SKIN:  Skin is warm and dry.  MOUTH and THROAT: Lips are without lesions.  Thick whitish coating on tongue  RESPIRATORY: Breathing is even & unlabored, BS CTAB CARDIAC: RRR, no murmur,no extra heart sounds, no edema GI: Abdomen soft, normal BS, no masses, no tenderness EXTREMITIES:  Able to move X 4 extremities NEUROLOGICAL: There is no tremor. Speech is clear PSYCHIATRIC: Alert and oriented X 3. Affect and behavior are appropriate   Labs reviewed: Recent Labs    01/07/18 1340  01/07/18 1558  06/25/18 1537 06/26/18 0521 06/27/18 0401  NA 134*   < >  --    < > 138 138 137  K 3.8   < >  --    < > 4.4 3.8 3.9  CL 101   < >  --    < > 108 108 109  CO2 22  --   --    < > 21* 23 24  GLUCOSE 76   < >  --    < > 93 89 110*  BUN 8   < >  --    < > 7* 7* 10  CREATININE 0.85   < >  --    < > 0.70 0.83 0.83  CALCIUM 8.9  --   --    < > 8.7* 8.5* 8.3*  MG 1.9  --   --   --   --  2.0 1.8  PHOS  --   --  2.8  --   --  3.2  --    < > = values in this interval not displayed.   Recent Labs     01/08/18 0627 06/25/18 1537 06/26/18 0521  AST 22 23 21   ALT 10* 11 11  ALKPHOS 60 60 54  BILITOT 1.1 0.8 1.2  PROT 5.4* 5.8* 5.4*  ALBUMIN 3.1* 3.5 3.3*   Recent Labs    01/07/18 1340 01/07/18 1344 06/25/18 1537 06/26/18 0521  WBC 10.5  --  7.6 10.1  NEUTROABS 8.8*  --  5.7  --   HGB 15.9 16.3  15.7 14.5  HCT 47.1 48.0 47.8 44.0  MCV 101.1*  --  105.8* 105.3*  PLT 193  --  214 218   Lab Results  Component Value Date   TSH 0.347 (L) 07/04/2012   Lab Results  Component Value Date   HGBA1C 4.5 (L) 08/27/2016   Lab Results  Component Value Date   CHOL 165 08/27/2016   HDL 52.20 08/27/2016   LDLCALC 96 08/27/2016   LDLDIRECT 79.9 09/30/2010   TRIG 82.0 08/27/2016   CHOLHDL 3 08/27/2016    Significant Diagnostic Results in last 30 days:  Dg Chest 2 View  Result Date: 06/25/2018 CLINICAL DATA:  Cough EXAM: CHEST - 2 VIEW COMPARISON:  10/14/2014 FINDINGS: There is no focal parenchymal opacity. There is no pleural effusion or pneumothorax. The heart and mediastinal contours are unremarkable. There is a healed old posttraumatic deformity of left proximal humerus. There is an age-indeterminate L1 vertebral body compression fracture. IMPRESSION: No active cardiopulmonary disease. Electronically Signed   By: Kathreen Devoid   On: 06/25/2018 16:31   Ct Head Wo Contrast  Result Date: 06/25/2018 CLINICAL DATA:  Left-sided weakness and altered mental status EXAM: CT HEAD WITHOUT CONTRAST TECHNIQUE: Contiguous axial images were obtained from the base of the skull through the vertex without intravenous contrast. COMPARISON:  January 07, 2018 FINDINGS: Brain: Moderate diffuse atrophy is stable. There is no intracranial mass, hemorrhage, extra-axial fluid collection, or midline shift. There is patchy small vessel disease in the centra semiovale bilaterally. No new brain parenchymal lesions evident. No acute infarct is appreciable. Vascular: No hyperdense vessels evident. There is calcification  in each carotid siphon region. Skull: Bony calvarium appears intact. Sinuses/Orbits: There is a small retention cyst in the inferomedial left maxillary antrum. There is mucosal thickening in several ethmoid air cells bilaterally. Postoperative changes are noted in the left lateral orbital wall and anterior left maxillary antrum. Orbits appear symmetric bilaterally. There is evidence of previous cataract removals bilaterally. Other: Mastoid air cells are clear. There is debris in each external auditory canal. IMPRESSION: Atrophy with patchy periventricular small vessel disease. No evident acute infarct. No mass or hemorrhage. There are foci of arterial vascular calcification. Foci of paranasal sinus disease noted with areas of postoperative change in the left facial region. There is probable cerumen in each external auditory canal. Electronically Signed   By: Lowella Grip III M.D.   On: 06/25/2018 16:01   Mr Brain Wo Contrast  Result Date: 06/27/2018 CLINICAL DATA:  77 year old male with left side weakness and altered mental status. EXAM: MRI HEAD WITHOUT CONTRAST TECHNIQUE: Multiplanar, multiecho pulse sequences of the brain and surrounding structures were obtained without intravenous contrast. COMPARISON:  Head CT 06/25/2018.  Brain MRI 10/15/2014. FINDINGS: Brain: Stable cerebral volume since the prior MRI. No restricted diffusion to suggest acute infarction. No midline shift, mass effect, evidence of mass lesion, ventriculomegaly, extra-axial collection or acute intracranial hemorrhage. Cervicomedullary junction and pituitary are within normal limits. Stable gray-white matter differentiation throughout the brain. Prominent mineralization of the bilateral basal ganglia. Mild for age patchy cerebral white matter T2 and FLAIR hyperintensity. No cortical encephalomalacia or definite chronic cerebral blood products identified. No new signal abnormality. Vascular: Major intracranial vascular flow voids are  stable since 2016. Skull and upper cervical spine: Chronic ligamentous hypertrophy about the odontoid. Normal visible bone marrow signal. Sinuses/Orbits: Stable and negative. Other: Mastoid air cells remain clear. Visible internal auditory structures appear normal. Scalp and face soft tissues appear negative. IMPRESSION: No acute intracranial  abnormality and stable MRI appearance of the brain since 2016. Electronically Signed   By: Genevie Ann M.D.   On: 06/27/2018 08:24    Assessment/Plan  1. Seizure disorder ([place]) - thought to be from alcohol withdrawal, continue Keppra 500 mg 1 tab every 12 hours, continue chlordiazepoxide tapering dose,  2. Alcohol abuse - counseled patient, folic acid 1 mg 1 tab daily and thiamine 100 mg 1 tab daily   3. Essential hypertension -well-controlled, not on any antihypertensive medication   4. Dyslipidemia -continue gemfibrozil 600 mg 1 tab twice daily Lab Results  Component Value Date   CHOL 165 08/27/2016   HDL 52.20 08/27/2016   LDLCALC 96 08/27/2016   LDLDIRECT 79.9 09/30/2010   TRIG 82.0 08/27/2016   CHOLHDL 3 08/27/2016    5. Vitamin D deficiency -continue vitamin D3 1000 units 1 tab daily  6. Dysphagia -continue dysphagia 3 diet and thin liquids, aspiration precautions  7. Oral candida -  will start nystatin 100,000 units/mL 5 mL swish and spit 4 times daily x2 weeks  8.  Physical deconditioning - for PT and OT, for therapeutic and strengthening exercises, fall precautions   Family/ staff Communication: Discussed plan of care with patient.  Labs/tests ordered: None  Goals of care:   [place]-term rehabilitation.   Durenda Age, NP [facility] [phone] (Monday-Friday 8:00 a.m. - 5:00 p.m.) [phone] (after hours)

## 2018-07-04 ENCOUNTER — Encounter: Payer: Self-pay | Admitting: Internal Medicine

## 2018-07-04 ENCOUNTER — Non-Acute Institutional Stay (SKILLED_NURSING_FACILITY): Payer: Medicare Other | Admitting: Internal Medicine

## 2018-07-04 DIAGNOSIS — J42 Unspecified chronic bronchitis: Secondary | ICD-10-CM | POA: Diagnosis not present

## 2018-07-04 DIAGNOSIS — E43 Unspecified severe protein-calorie malnutrition: Secondary | ICD-10-CM

## 2018-07-04 DIAGNOSIS — F17210 Nicotine dependence, cigarettes, uncomplicated: Secondary | ICD-10-CM | POA: Diagnosis not present

## 2018-07-04 DIAGNOSIS — F1010 Alcohol abuse, uncomplicated: Secondary | ICD-10-CM | POA: Diagnosis not present

## 2018-07-04 NOTE — Assessment & Plan Note (Signed)
Cigarettes found in his room 07/03/2018 Nicotine patch ordered
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
## 2018-07-04 NOTE — Assessment & Plan Note (Signed)
Protein supplementation. °

## 2018-07-04 NOTE — Assessment & Plan Note (Signed)
Chronic cough with minimal sputum production DuoNebs 4 times daily as needed

## 2018-07-04 NOTE — Patient Instructions (Signed)
See assessment and plan under each diagnosis in the problem list and acutely for this visit 

## 2018-07-04 NOTE — Assessment & Plan Note (Addendum)
Continue Librium protocol, thiamine, folic acid Keppra to treat alcohol withdrawal seizure

## 2018-07-06 ENCOUNTER — Emergency Department (HOSPITAL_COMMUNITY): Payer: Medicare Other

## 2018-07-06 ENCOUNTER — Encounter: Payer: Self-pay | Admitting: Adult Health

## 2018-07-06 ENCOUNTER — Non-Acute Institutional Stay (SKILLED_NURSING_FACILITY): Payer: Medicare Other | Admitting: Adult Health

## 2018-07-06 ENCOUNTER — Inpatient Hospital Stay (HOSPITAL_COMMUNITY)
Admission: EM | Admit: 2018-07-06 | Discharge: 2018-07-24 | DRG: 870 | Disposition: A | Discharge status: Skilled Nursing Facility | Payer: Medicare Other | Source: Skilled Nursing Facility | Attending: Internal Medicine | Admitting: Internal Medicine

## 2018-07-06 ENCOUNTER — Other Ambulatory Visit: Payer: Self-pay

## 2018-07-06 ENCOUNTER — Encounter (HOSPITAL_COMMUNITY): Payer: Self-pay

## 2018-07-06 DIAGNOSIS — K449 Diaphragmatic hernia without obstruction or gangrene: Secondary | ICD-10-CM | POA: Diagnosis present

## 2018-07-06 DIAGNOSIS — R1310 Dysphagia, unspecified: Secondary | ICD-10-CM

## 2018-07-06 DIAGNOSIS — J90 Pleural effusion, not elsewhere classified: Secondary | ICD-10-CM | POA: Diagnosis not present

## 2018-07-06 DIAGNOSIS — I10 Essential (primary) hypertension: Secondary | ICD-10-CM | POA: Diagnosis present

## 2018-07-06 DIAGNOSIS — Z833 Family history of diabetes mellitus: Secondary | ICD-10-CM

## 2018-07-06 DIAGNOSIS — Z01818 Encounter for other preprocedural examination: Secondary | ICD-10-CM

## 2018-07-06 DIAGNOSIS — F17210 Nicotine dependence, cigarettes, uncomplicated: Secondary | ICD-10-CM | POA: Diagnosis present

## 2018-07-06 DIAGNOSIS — J9602 Acute respiratory failure with hypercapnia: Secondary | ICD-10-CM | POA: Diagnosis not present

## 2018-07-06 DIAGNOSIS — Z7401 Bed confinement status: Secondary | ICD-10-CM | POA: Diagnosis not present

## 2018-07-06 DIAGNOSIS — R918 Other nonspecific abnormal finding of lung field: Secondary | ICD-10-CM | POA: Diagnosis not present

## 2018-07-06 DIAGNOSIS — Z86010 Personal history of colonic polyps: Secondary | ICD-10-CM

## 2018-07-06 DIAGNOSIS — Z79899 Other long term (current) drug therapy: Secondary | ICD-10-CM

## 2018-07-06 DIAGNOSIS — J189 Pneumonia, unspecified organism: Secondary | ICD-10-CM | POA: Diagnosis present

## 2018-07-06 DIAGNOSIS — F419 Anxiety disorder, unspecified: Secondary | ICD-10-CM | POA: Diagnosis present

## 2018-07-06 DIAGNOSIS — K5750 Diverticulosis of both small and large intestine without perforation or abscess without bleeding: Secondary | ICD-10-CM | POA: Diagnosis present

## 2018-07-06 DIAGNOSIS — Y95 Nosocomial condition: Secondary | ICD-10-CM | POA: Diagnosis present

## 2018-07-06 DIAGNOSIS — Z978 Presence of other specified devices: Secondary | ICD-10-CM

## 2018-07-06 DIAGNOSIS — F1020 Alcohol dependence, uncomplicated: Secondary | ICD-10-CM | POA: Diagnosis present

## 2018-07-06 DIAGNOSIS — D7589 Other specified diseases of blood and blood-forming organs: Secondary | ICD-10-CM | POA: Diagnosis present

## 2018-07-06 DIAGNOSIS — G9341 Metabolic encephalopathy: Secondary | ICD-10-CM | POA: Diagnosis present

## 2018-07-06 DIAGNOSIS — R6521 Severe sepsis with septic shock: Secondary | ICD-10-CM | POA: Diagnosis present

## 2018-07-06 DIAGNOSIS — Z681 Body mass index (BMI) 19 or less, adult: Secondary | ICD-10-CM | POA: Diagnosis not present

## 2018-07-06 DIAGNOSIS — M40209 Unspecified kyphosis, site unspecified: Secondary | ICD-10-CM | POA: Diagnosis present

## 2018-07-06 DIAGNOSIS — R05 Cough: Secondary | ICD-10-CM | POA: Diagnosis not present

## 2018-07-06 DIAGNOSIS — Z7982 Long term (current) use of aspirin: Secondary | ICD-10-CM

## 2018-07-06 DIAGNOSIS — K224 Dyskinesia of esophagus: Secondary | ICD-10-CM | POA: Diagnosis present

## 2018-07-06 DIAGNOSIS — J690 Pneumonitis due to inhalation of food and vomit: Secondary | ICD-10-CM | POA: Diagnosis present

## 2018-07-06 DIAGNOSIS — G40909 Epilepsy, unspecified, not intractable, without status epilepticus: Secondary | ICD-10-CM | POA: Diagnosis present

## 2018-07-06 DIAGNOSIS — J181 Lobar pneumonia, unspecified organism: Secondary | ICD-10-CM

## 2018-07-06 DIAGNOSIS — R933 Abnormal findings on diagnostic imaging of other parts of digestive tract: Secondary | ICD-10-CM | POA: Diagnosis not present

## 2018-07-06 DIAGNOSIS — E876 Hypokalemia: Secondary | ICD-10-CM | POA: Diagnosis not present

## 2018-07-06 DIAGNOSIS — J9601 Acute respiratory failure with hypoxia: Secondary | ICD-10-CM | POA: Diagnosis not present

## 2018-07-06 DIAGNOSIS — M2550 Pain in unspecified joint: Secondary | ICD-10-CM | POA: Diagnosis not present

## 2018-07-06 DIAGNOSIS — F0281 Dementia in other diseases classified elsewhere with behavioral disturbance: Secondary | ICD-10-CM | POA: Diagnosis not present

## 2018-07-06 DIAGNOSIS — J9600 Acute respiratory failure, unspecified whether with hypoxia or hypercapnia: Secondary | ICD-10-CM | POA: Diagnosis not present

## 2018-07-06 DIAGNOSIS — I69328 Other speech and language deficits following cerebral infarction: Secondary | ICD-10-CM | POA: Diagnosis not present

## 2018-07-06 DIAGNOSIS — E440 Moderate protein-calorie malnutrition: Secondary | ICD-10-CM | POA: Diagnosis present

## 2018-07-06 DIAGNOSIS — K5710 Diverticulosis of small intestine without perforation or abscess without bleeding: Secondary | ICD-10-CM | POA: Diagnosis not present

## 2018-07-06 DIAGNOSIS — R001 Bradycardia, unspecified: Secondary | ICD-10-CM | POA: Diagnosis not present

## 2018-07-06 DIAGNOSIS — J449 Chronic obstructive pulmonary disease, unspecified: Secondary | ICD-10-CM | POA: Diagnosis present

## 2018-07-06 DIAGNOSIS — Z4682 Encounter for fitting and adjustment of non-vascular catheter: Secondary | ICD-10-CM | POA: Diagnosis not present

## 2018-07-06 DIAGNOSIS — R410 Disorientation, unspecified: Secondary | ICD-10-CM | POA: Diagnosis not present

## 2018-07-06 DIAGNOSIS — X58XXXA Exposure to other specified factors, initial encounter: Secondary | ICD-10-CM | POA: Diagnosis not present

## 2018-07-06 DIAGNOSIS — R4701 Aphasia: Secondary | ICD-10-CM | POA: Diagnosis present

## 2018-07-06 DIAGNOSIS — J168 Pneumonia due to other specified infectious organisms: Secondary | ICD-10-CM | POA: Diagnosis not present

## 2018-07-06 DIAGNOSIS — K219 Gastro-esophageal reflux disease without esophagitis: Secondary | ICD-10-CM | POA: Diagnosis present

## 2018-07-06 DIAGNOSIS — A419 Sepsis, unspecified organism: Principal | ICD-10-CM | POA: Diagnosis present

## 2018-07-06 DIAGNOSIS — R404 Transient alteration of awareness: Secondary | ICD-10-CM | POA: Diagnosis not present

## 2018-07-06 DIAGNOSIS — Z9089 Acquired absence of other organs: Secondary | ICD-10-CM

## 2018-07-06 DIAGNOSIS — G20 Parkinson's disease: Secondary | ICD-10-CM | POA: Diagnosis not present

## 2018-07-06 DIAGNOSIS — Z781 Physical restraint status: Secondary | ICD-10-CM

## 2018-07-06 DIAGNOSIS — R0602 Shortness of breath: Secondary | ICD-10-CM | POA: Diagnosis not present

## 2018-07-06 DIAGNOSIS — K222 Esophageal obstruction: Secondary | ICD-10-CM | POA: Diagnosis not present

## 2018-07-06 DIAGNOSIS — T17890A Other foreign object in other parts of respiratory tract causing asphyxiation, initial encounter: Secondary | ICD-10-CM | POA: Diagnosis not present

## 2018-07-06 DIAGNOSIS — J811 Chronic pulmonary edema: Secondary | ICD-10-CM | POA: Diagnosis not present

## 2018-07-06 DIAGNOSIS — E785 Hyperlipidemia, unspecified: Secondary | ICD-10-CM | POA: Diagnosis present

## 2018-07-06 DIAGNOSIS — G9340 Encephalopathy, unspecified: Secondary | ICD-10-CM | POA: Diagnosis not present

## 2018-07-06 DIAGNOSIS — W19XXXA Unspecified fall, initial encounter: Secondary | ICD-10-CM

## 2018-07-06 DIAGNOSIS — R000 Tachycardia, unspecified: Secondary | ICD-10-CM | POA: Diagnosis present

## 2018-07-06 DIAGNOSIS — Z9852 Vasectomy status: Secondary | ICD-10-CM

## 2018-07-06 DIAGNOSIS — E8770 Fluid overload, unspecified: Secondary | ICD-10-CM | POA: Diagnosis present

## 2018-07-06 DIAGNOSIS — R1314 Dysphagia, pharyngoesophageal phase: Secondary | ICD-10-CM | POA: Diagnosis present

## 2018-07-06 DIAGNOSIS — R569 Unspecified convulsions: Secondary | ICD-10-CM | POA: Diagnosis not present

## 2018-07-06 DIAGNOSIS — R0902 Hypoxemia: Secondary | ICD-10-CM | POA: Diagnosis not present

## 2018-07-06 DIAGNOSIS — Y92230 Patient room in hospital as the place of occurrence of the external cause: Secondary | ICD-10-CM | POA: Diagnosis not present

## 2018-07-06 DIAGNOSIS — J9690 Respiratory failure, unspecified, unspecified whether with hypoxia or hypercapnia: Secondary | ICD-10-CM | POA: Diagnosis not present

## 2018-07-06 DIAGNOSIS — Z801 Family history of malignant neoplasm of trachea, bronchus and lung: Secondary | ICD-10-CM

## 2018-07-06 LAB — CBC WITH DIFFERENTIAL/PLATELET
BAND NEUTROPHILS: 2 %
BASOS PCT: 0 %
Basophils Absolute: 0 10*3/uL (ref 0.0–0.1)
Eosinophils Absolute: 0 10*3/uL (ref 0.0–0.5)
Eosinophils Relative: 0 %
HCT: 47.2 % (ref 39.0–52.0)
Hemoglobin: 15.2 g/dL (ref 13.0–17.0)
Lymphocytes Relative: 4 %
Lymphs Abs: 1.3 10*3/uL (ref 0.7–4.0)
MCH: 34.1 pg — AB (ref 26.0–34.0)
MCHC: 32.2 g/dL (ref 30.0–36.0)
MCV: 105.8 fL — AB (ref 80.0–100.0)
MYELOCYTES: 1 %
Monocytes Absolute: 1.7 10*3/uL — ABNORMAL HIGH (ref 0.1–1.0)
Monocytes Relative: 5 %
NEUTROS ABS: 30.3 10*3/uL — AB (ref 1.7–7.7)
NRBC: 0 /100{WBCs}
Neutrophils Relative %: 88 %
PLATELETS: 369 10*3/uL (ref 150–400)
RBC: 4.46 MIL/uL (ref 4.22–5.81)
RDW: 14 % (ref 11.5–15.5)
WBC: 33.3 10*3/uL — AB (ref 4.0–10.5)

## 2018-07-06 LAB — BASIC METABOLIC PANEL
ANION GAP: 12 (ref 5–15)
BUN: 15 mg/dL (ref 8–23)
CALCIUM: 8.9 mg/dL (ref 8.9–10.3)
CO2: 25 mmol/L (ref 22–32)
Chloride: 99 mmol/L (ref 98–111)
Creatinine, Ser: 0.86 mg/dL (ref 0.61–1.24)
GFR calc Af Amer: >60 mL/min (ref >60)
GLUCOSE: 143 mg/dL — AB (ref 70–99)
POTASSIUM: 4.3 mmol/L (ref 3.5–5.1)
SODIUM: 136 mmol/L (ref 135–145)

## 2018-07-06 LAB — I-STAT CG4 LACTIC ACID, ED: Lactic Acid, Venous: 2.12 mmol/L (ref 0.5–1.9)

## 2018-07-06 MED ORDER — PIPERACILLIN-TAZOBACTAM 3.375 G IVPB 30 MIN
3.3750 g | Freq: Once | INTRAVENOUS | Status: AC
  Administered 2018-07-06: 3.375 g via INTRAVENOUS
  Filled 2018-07-06: qty 50

## 2018-07-06 MED ORDER — SODIUM CHLORIDE 0.9 % IV BOLUS
1000.0000 mL | Freq: Once | INTRAVENOUS | Status: AC
  Administered 2018-07-06: 1000 mL via INTRAVENOUS

## 2018-07-06 MED ORDER — IPRATROPIUM-ALBUTEROL 0.5-2.5 (3) MG/3ML IN SOLN
3.0000 mL | Freq: Four times a day (QID) | RESPIRATORY_TRACT | Status: DC | PRN
  Administered 2018-07-06: 3 mL via RESPIRATORY_TRACT
  Filled 2018-07-06: qty 3

## 2018-07-06 NOTE — ED Provider Notes (Addendum)
[place] EMERGENCY DEPARTMENT Provider Note   CSN: [id number] Arrival date & time: 07/06/18  1856     History   Chief Complaint Chief Complaint  Patient presents with  . Cough    HPI Blake [name] is a 77 y.o. male.  The history is provided by the patient and medical records. No language interpreter was used.   Blake [name] is a 77 y.o. male  with a PMH listed below including COPD, GERD, prior stroke who presents to the Emergency Department from [facility] facility for worsening productive cough and shortness of breath noticed by facility.  They were unable to provide me any details about when symptoms started.  Per facility, there were no fevers noted.  No medications given prior to arrival for symptoms.  He does have history of a prior stroke and difficulty communicating.  He was unable to provide any history regarding today's events.  Level V caveat applies 2/2 Mental status, prior stroke   Past Medical History:  Diagnosis Date  . Chronic insomnia   . Cigarette smoker   . COPD (chronic obstructive pulmonary disease) ([place])   . Diverticulosis of colon   . Elevated prostate specific antigen (PSA)   . ETOH abuse   . GERD (gastroesophageal reflux disease)   . Hx of colonic polyps   . Hyperlipidemia   . Hypertension   . Pulmonary nodule   . Shortness of breath   . Stroke [facility])     Patient Active Problem List   Diagnosis Date Noted  . Pneumonia 07/06/2018  . Protein-calorie malnutrition, severe 06/26/2018  . Alcohol withdrawal seizure ([place]) 06/25/2018  . New onset seizure ([place]) 01/07/2018  . Hypertension 01/07/2018  . GERD (gastroesophageal reflux disease) 01/07/2018  . Hyperlipidemia 01/07/2018  . Cigarette smoker 01/07/2018  . Alcohol abuse 01/07/2018  . Acute alcohol abuse, with delirium ([place])   . Acute encephalopathy 10/14/2014  . Unsteady gait 10/14/2014  . Alcohol withdrawal delirium ([place]) 10/14/2014  . Lipoma of neck 12/22/2012  .  Cerebral atrophy ([place]) 09/28/2012  . Cerebrovascular small vessel disease 09/28/2012  . Left humeral fracture 07/04/2012  . Delirium 07/04/2012  . ETOH abuse   . ELEVATED PROSTATE SPECIFIC ANTIGEN 09/30/2010  . COPD (chronic obstructive pulmonary disease) ([place]) 01/28/2009  . PULMONARY NODULE 01/28/2009  . INSOMNIA, CHRONIC 10/15/2007  . COLONIC POLYPS 10/10/2007  . Type 2 diabetes mellitus ([place]) 10/10/2007  . Dyslipidemia 10/10/2007  . Essential hypertension 10/10/2007  . GERD 10/10/2007  . DIVERTICULOSIS OF COLON 10/10/2007    Past Surgical History:  Procedure Laterality Date  . COLONOSCOPY    . needle biopsy RUL nodule  01/2009   benign  . TONSILLECTOMY  1947  . VASECTOMY          Home Medications    Prior to Admission medications   Medication Sig Start Date End Date Taking? Authorizing Provider  aspirin 81 MG chewable tablet Chew 81 mg by mouth daily.   Yes [provider]  bisacodyl (DULCOLAX) 10 MG suppository Place 10 mg rectally once as needed (for constipation not relieved by Milk of Magnesia).   Yes [provider]  cefTRIAXone (ROCEPHIN) 1 g injection Inject 1 g into the muscle See admin instructions. Inject 1 gram IM once a day for 7 days 07/06/18 07/13/18 Yes [provider]  Cholecalciferol (VITAMIN D-3) 1000 units CAPS Take 1,000 Units by mouth daily.   Yes [provider]  folic acid (FOLVITE) 1 MG tablet Take 1  tablet (1 mg total) by mouth daily. 08/27/16  Yes Nche, Charlene Brooke, NP  gemfibrozil (LOPID) 600 MG tablet Take 1 tablet (600 mg total) by mouth 2 (two) times daily. 08/27/16  Yes Nche, Charlene Brooke, NP  guaiFENesin (ROBITUSSIN) 100 MG/5ML liquid Take 200 mg by mouth every 4 (four) hours as needed for cough. x48 hours   Yes [provider]  ipratropium-albuterol (DUONEB) 0.5-2.5 (3) MG/3ML SOLN Take 3 mLs by nebulization See admin instructions. Nebulize and inhale 3 ml's into the lungs at 6 AM, 2 PM, and 10 PM  for 5 days with a start date of 07/06/18 AND 3 ml's every six hours as needed for coughing   Yes [provider]  levETIRAcetam (KEPPRA) 500 MG tablet Take 1 tablet (500 mg total) by mouth 2 (two) times daily. Patient taking differently: Take 500 mg by mouth every 12 (twelve) hours.  06/29/18  Yes Thurnell Lose, MD  magnesium hydroxide (MILK OF MAGNESIA) 400 MG/5ML suspension Take 30 mLs by mouth once as needed for mild constipation.   Yes [provider]  nicotine (NICODERM CQ - DOSED IN MG/24 HOURS) 21 mg/24hr patch Place 21 mg onto the skin daily.   Yes [provider]  Nutritional Supplements (NUTRITIONAL SUPPLEMENT PLUS) LIQD Take 120 mLs by mouth See admin instructions. MedPass: Drink 120 ml's by mouth two times a day   Yes [provider]  nystatin (MYCOSTATIN) 100000 UNIT/ML suspension Use as directed 5 mLs in the mouth or throat See admin instructions. Swish and spit 5 ml's four times a day for 14 days- Start: 07/04/18 & Stop: 07/14/18 07/04/18 07/14/18 Yes [provider]  Sodium Phosphates (RA SALINE ENEMA) 19-7 GM/118ML ENEM Place 1 enema rectally once as needed (for constipation not relieved by Dulcolax suppository and call MD if no relief from enema).   Yes [provider]  thiamine 100 MG tablet Take 1 tablet (100 mg total) by mouth daily. 08/27/16  Yes Nche, Charlene Brooke, NP  aspirin EC 81 MG tablet Take 1 tablet (81 mg total) by mouth daily. Patient not taking: Reported on 07/06/2018 08/27/16   Nche, Charlene Brooke, NP  chlordiazePOXIDE (LIBRIUM) 5 MG capsule Please dispense 18 pills - Take 1 pill three times a day for 3 days, then Take 1 pill two times a day for 3 days, then Take 1 pill once a day for 3 days and stop. Patient taking differently: Take 5 mg by mouth See admin instructions. Take 5 mg by mouth once a day for 3 days, then stop/Start: 07/09/18 & Stop: 07/12/18 06/29/18   Thurnell Lose, MD  cholecalciferol (VITAMIN D)  1000 units tablet Take 1 tablet (1,000 Units total) by mouth daily. Patient not taking: Reported on 07/06/2018 08/27/16   Nche, Charlene Brooke, NP  saccharomyces boulardii (FLORASTOR) 250 MG capsule Take 250 mg by mouth See admin instructions. Take 250 mg by mouth 2 times a day for 10 days 07/07/18 07/16/18  [provider]    Family History Family History  Problem Relation Age of Onset  . Diabetes Father   . Lung cancer Brother   . Colon cancer Neg Hx   . Esophageal cancer Neg Hx   . Stomach cancer Neg Hx     Social History Social History   Tobacco Use  . Smoking status: Current Every Day Smoker    Packs/day: 1.00    Years: 55.00    Pack years: 55.00    Types: Cigarettes  .  Smokeless tobacco: Never Used  Substance Use Topics  . Alcohol use: Yes    Alcohol/week: 24.0 standard drinks    Types: 24 Cans of beer per week    Comment: 12-24 beer daily  . Drug use: No     Allergies   Patient has no known allergies.   Review of Systems Review of Systems  Unable to perform ROS: Other (Mental status, prior stroke)  Constitutional: Negative for fever.  HENT: Positive for congestion.   Respiratory: Positive for cough and shortness of breath.      Physical Exam Updated Vital Signs BP 126/79   Pulse (!) 106   Temp (!) 96.6 F (35.9 C) (Rectal)   Resp (!) 31   SpO2 94%   Physical Exam  Constitutional: He is oriented to person, place, and time. He appears well-developed and well-nourished. No distress.  HENT:  Head: Normocephalic and atraumatic.  Cardiovascular: Normal rate, regular rhythm and normal heart sounds.  No murmur heard. Pulmonary/Chest: Effort normal. No respiratory distress.  Crackles to left lung fields.  Right lung sounds clear.  No wheezing appreciated.  Abdominal: Soft. He exhibits no distension. There is no tenderness.  Musculoskeletal: He exhibits no edema.  Neurological: He is alert and oriented to person, place, and time.  Skin: Skin is  warm and dry.  Nursing note and vitals reviewed.    ED Treatments / Results  Labs (all labs ordered are listed, but only abnormal results are displayed) Labs Reviewed  CBC WITH DIFFERENTIAL/PLATELET - Abnormal; Notable for the following components:      Result Value   WBC 33.3 (*)    MCV 105.8 (*)    MCH 34.1 (*)    Neutro Abs 30.3 (*)    Monocytes Absolute 1.7 (*)    All other components within normal limits  BASIC METABOLIC PANEL - Abnormal; Notable for the following components:   Glucose, Bld 143 (*)    All other components within normal limits  I-STAT CG4 LACTIC ACID, ED - Abnormal; Notable for the following components:   Lactic Acid, Venous 2.12 (*)    All other components within normal limits  URINE CULTURE  CULTURE, BLOOD (ROUTINE X 2)  PH, GASTRIC FLUID (GASTROCCULT CARD)  URINALYSIS, ROUTINE W REFLEX MICROSCOPIC    EKG None  Radiology Dg Chest Portable 1 View  Result Date: 07/06/2018 CLINICAL DATA:  Pt arrives to ED per [facility] from [facility] which he is there for rehab for seizures per paperwork. Pt arrives for productive cough and shortness of breath, unknown when it started. Per [place] paperwork, pt is alert and disoriented at baseline EXAM: PORTABLE CHEST 1 VIEW COMPARISON:  06/25/2018 FINDINGS: Heart size is normal. There is patchy infiltrate within the LEFT LOWER lobe, having developed since the previous exam. Numerous old LEFT rib fractures. Deformity of the LEFT humerus. Heart size is normal. There is atherosclerotic calcification of the thoracic aorta. No pulmonary edema. IMPRESSION: LEFT LOWER lobe infiltrate consistent with infectious process. Followup PA and lateral chest X-ray is recommended in 3-4 weeks following trial of antibiotic therapy to ensure resolution and exclude underlying malignancy. Electronically Signed   By: Nolon Nations M.D.   On: 07/06/2018 19:54    Procedures Procedures (including critical care time)  CRITICAL CARE Performed by:  Ozella Almond Ward   Total critical care time: 35 minutes  Critical care time was exclusive of separately billable procedures and treating other patients.  Critical care was necessary to treat or prevent imminent or life-threatening  deterioration.  Critical care was time spent personally by me on the following activities: development of treatment plan with patient and/or surrogate as well as nursing, discussions with consultants, evaluation of patient's response to treatment, examination of patient, obtaining history from patient or surrogate, ordering and performing treatments and interventions, ordering and review of laboratory studies, ordering and review of radiographic studies, pulse oximetry and re-evaluation of patient's condition.  Medications Ordered in ED Medications  sodium chloride 0.9 % bolus 1,000 mL (1,000 mLs Intravenous New Bag/Given 07/06/18 2050)  sodium chloride 0.9 % bolus 1,000 mL (1,000 mLs Intravenous New Bag/Given 07/06/18 2117)  piperacillin-tazobactam (ZOSYN) IVPB 3.375 g (0 g Intravenous Stopped 07/06/18 2124)     Initial Impression / Assessment and Plan / ED Course  I have reviewed the triage vital signs and the nursing notes.  Pertinent labs & imaging results that were available during my care of the patient were reviewed by me and considered in my medical decision making (see chart for details).    DMANI MIZER is a 77 y.o. male who presents to ED from facility for productive cough and shortness of breath. Upon ED arrival, patient with large amount of OP secretions which were suctioned. Left lung crackles. Concerned for aspiration PNA.  Labs reviewed and notable for leukocytosis of 33.3, lactic of 2.12. 2L fluid given. Blood culture obtained. CXR c/w left lower lobe PNA. Started on zosyn. Hospitalist consulted who will admit.     Patient discussed with Dr. Wilson Singer who agrees with treatment plan.    Final Clinical Impressions(s) / ED Diagnoses   Final  diagnoses:  Pneumonia of left lower lobe due to infectious organism [facility])    ED Discharge Orders    None       Ward, Ozella Almond, PA-C 07/06/18 2138    Virgel Manifold, MD 07/07/18 [place], Ozella Almond, PA-C 07/21/18 1337    Virgel Manifold, MD 07/21/18 1501

## 2018-07-06 NOTE — ED Triage Notes (Signed)
Pt arrives to ED per [facility] from [facility] which he is there for for rehab for seizures per paperwork. Pt arrives for productive cough and shortness of breath, unknown when it started. Per [place] paperwork, pt is alert and disoriented at baseline.

## 2018-07-06 NOTE — ED Notes (Signed)
Condom cath placed.

## 2018-07-06 NOTE — ED Notes (Signed)
Pt has condom cath on, will get urine sample when able.

## 2018-07-06 NOTE — H&P (Addendum)
History and Physical    Alani Lacivita Ramaker ZOX:[id number] DOB: 06-06-1941 DOA: 07/06/2018  PCP: Hendricks Limes, MD Patient coming from: [facility]  Chief Complaint: Cough, shortness of breath  HPI: Blake [name] is a 77 y.o. male with medical history significant of COPD, GERD, CVA, hypertension, hyperlipidemia presenting to the hospital from [facility] home for further evaluation of worsening productive cough and shortness of breath noticed at the facility.  Staff at the facility were unable to provide the ED provider with any details about when the symptoms started.  No fevers were noted in the facility.  Patient has a history of prior stroke and is unable to communicate.  Per [employer] paperwork, he is alert and disoriented at baseline.  No history could be obtained from him.  Per chart review, patient was seen at [facility] today and noted to have O2 saturation in the 80s earlier this morning.  Chest x-ray revealed left lower lobe pneumonia.  He was noted to have confusion and was seen on the floor in the afternoon.  He was able to move all of his extremities without difficulty.  She was started on Rocephin and DuoNeb's at the PCPs office.    Patient was seen and examined at bedside.  He was awake and alert.  Oriented to self.  Satting well on 6 L of oxygen via nasal cannula.  ED Course: Tachycardic (pulse 110) and hypertensive blood pressure (157/81) on arrival.  Satting well on 6 L of supplemental oxygen via nasal cannula.  He was noted to have large amounts of oropharyngeal secretions in the ED which were suctioned.  Labs showing white count 33.3.  Lactic acid 2.1. Kidney function at baseline.  Chest x-ray showing left lower lobe infiltrate. [facility] paged to admit.   Review of Systems: As per HPI otherwise 10 point review of systems negative.  Past Medical History:  Diagnosis Date  . Chronic insomnia   . Cigarette smoker   . COPD (chronic obstructive pulmonary  disease) ([place])   . Diverticulosis of colon   . Elevated prostate specific antigen (PSA)   . ETOH abuse   . GERD (gastroesophageal reflux disease)   . Hx of colonic polyps   . Hyperlipidemia   . Hypertension   . Pulmonary nodule   . Shortness of breath   . Stroke [facility])     Past Surgical History:  Procedure Laterality Date  . COLONOSCOPY    . needle biopsy RUL nodule  01/2009   benign  . TONSILLECTOMY  1947  . VASECTOMY       reports that he has been smoking cigarettes. He has a 55.00 pack-year smoking history. He has never used smokeless tobacco. He reports that he drinks about 24.0 standard drinks of alcohol per week. He reports that he does not use drugs.  No Known Allergies  Family History  Problem Relation Age of Onset  . Diabetes Father   . Lung cancer Brother   . Colon cancer Neg Hx   . Esophageal cancer Neg Hx   . Stomach cancer Neg Hx     Prior to Admission medications   Medication Sig Start Date End Date Taking? Authorizing Provider  aspirin 81 MG chewable tablet Chew 81 mg by mouth daily.   Yes [provider]  bisacodyl (DULCOLAX) 10 MG suppository Place 10 mg rectally once as needed (for constipation not relieved by Milk of Magnesia).   Yes [provider]  cefTRIAXone (ROCEPHIN) 1 g  injection Inject 1 g into the muscle See admin instructions. Inject 1 gram IM once a day for 7 days 07/06/18 07/13/18 Yes [provider]  Cholecalciferol (VITAMIN D-3) 1000 units CAPS Take 1,000 Units by mouth daily.   Yes [provider]  folic acid (FOLVITE) 1 MG tablet Take 1 tablet (1 mg total) by mouth daily. 08/27/16  Yes Nche, Charlene Brooke, NP  gemfibrozil (LOPID) 600 MG tablet Take 1 tablet (600 mg total) by mouth 2 (two) times daily. 08/27/16  Yes Nche, Charlene Brooke, NP  guaiFENesin (ROBITUSSIN) 100 MG/5ML liquid Take 200 mg by mouth every 4 (four) hours as needed for cough. x48 hours   Yes [provider]  ipratropium-albuterol  (DUONEB) 0.5-2.5 (3) MG/3ML SOLN Take 3 mLs by nebulization See admin instructions. Nebulize and inhale 3 ml's into the lungs at 6 AM, 2 PM, and 10 PM for 5 days with a start date of 07/06/18 AND 3 ml's every six hours as needed for coughing   Yes [provider]  levETIRAcetam (KEPPRA) 500 MG tablet Take 1 tablet (500 mg total) by mouth 2 (two) times daily. Patient taking differently: Take 500 mg by mouth every 12 (twelve) hours.  06/29/18  Yes Thurnell Lose, MD  magnesium hydroxide (MILK OF MAGNESIA) 400 MG/5ML suspension Take 30 mLs by mouth once as needed for mild constipation.   Yes [provider]  nicotine (NICODERM CQ - DOSED IN MG/24 HOURS) 21 mg/24hr patch Place 21 mg onto the skin daily.   Yes [provider]  Nutritional Supplements (NUTRITIONAL SUPPLEMENT PLUS) LIQD Take 120 mLs by mouth See admin instructions. MedPass: Drink 120 ml's by mouth two times a day   Yes [provider]  nystatin (MYCOSTATIN) 100000 UNIT/ML suspension Use as directed 5 mLs in the mouth or throat See admin instructions. Swish and spit 5 ml's four times a day for 14 days- Start: 07/04/18 & Stop: 07/14/18 07/04/18 07/14/18 Yes [provider]  Sodium Phosphates (RA SALINE ENEMA) 19-7 GM/118ML ENEM Place 1 enema rectally once as needed (for constipation not relieved by Dulcolax suppository and call MD if no relief from enema).   Yes [provider]  thiamine 100 MG tablet Take 1 tablet (100 mg total) by mouth daily. 08/27/16  Yes Nche, Charlene Brooke, NP  aspirin EC 81 MG tablet Take 1 tablet (81 mg total) by mouth daily. Patient not taking: Reported on 07/06/2018 08/27/16   Nche, Charlene Brooke, NP  chlordiazePOXIDE (LIBRIUM) 5 MG capsule Please dispense 18 pills - Take 1 pill three times a day for 3 days, then Take 1 pill two times a day for 3 days, then Take 1 pill once a day for 3 days and stop. Patient taking differently: Take 5 mg by mouth See admin  instructions. Take 5 mg by mouth once a day for 3 days, then stop/Start: 07/09/18 & Stop: 07/12/18 06/29/18   Thurnell Lose, MD  cholecalciferol (VITAMIN D) 1000 units tablet Take 1 tablet (1,000 Units total) by mouth daily. Patient not taking: Reported on 07/06/2018 08/27/16   Nche, Charlene Brooke, NP  saccharomyces boulardii (FLORASTOR) 250 MG capsule Take 250 mg by mouth See admin instructions. Take 250 mg by mouth 2 times a day for 10 days 07/07/18 07/16/18  [provider]    Physical Exam: Vitals:   07/07/18 0219 07/07/18 0221 07/07/18 0237 07/07/18 0315  BP:  (!) 120/52 (!) 119/50   Pulse:      Resp:  20    Temp:      TempSrc:      SpO2: 97% 97% 98% 94%  Weight:      Height:    5\' 5"  (1.651 m)   Physical Exam  Constitutional: No distress.  HENT:  Head: Normocephalic and atraumatic.  Eyes: Right eye exhibits no discharge. Left eye exhibits no discharge.  Neck: Neck supple. No tracheal deviation present.  Cardiovascular: Normal rate, regular rhythm and intact distal pulses.  Pulmonary/Chest: Effort normal. He has no wheezes.  Decreased breath sounds at left lung base. Oropharyngeal secretions requiring suctioning.  Abdominal: Soft. Bowel sounds are normal. He exhibits no distension. There is no tenderness.  Musculoskeletal: He exhibits no edema.  Neurological:  Awake and alert Oriented to self  Skin: Skin is warm and dry. He is not diaphoretic.     Labs on Admission: I have personally reviewed following labs and imaging studies  CBC: Recent Labs  Lab 07/06/18 1932  WBC 33.3*  NEUTROABS 30.3*  HGB 15.2  HCT 47.2  MCV 105.8*  PLT 341   Basic Metabolic Panel: Recent Labs  Lab 07/06/18 1932  NA 136  K 4.3  CL 99  CO2 25  GLUCOSE 143*  BUN 15  CREATININE 0.86  CALCIUM 8.9   GFR: Estimated Creatinine Clearance: 62.6 mL/min (by C-G formula based on SCr of 0.86 mg/dL). Liver Function Tests: No results for input(s): AST, ALT, ALKPHOS, BILITOT,  PROT, ALBUMIN in the last 168 hours. No results for input(s): LIPASE, AMYLASE in the last 168 hours. No results for input(s): AMMONIA in the last 168 hours. Coagulation Profile: No results for input(s): INR, PROTIME in the last 168 hours. Cardiac Enzymes: No results for input(s): CKTOTAL, CKMB, CKMBINDEX, TROPONINI in the last 168 hours. BNP (last 3 results) No results for input(s): PROBNP in the last 8760 hours. HbA1C: No results for input(s): HGBA1C in the last 72 hours. CBG: No results for input(s): GLUCAP in the last 168 hours. Lipid Profile: No results for input(s): CHOL, HDL, LDLCALC, TRIG, CHOLHDL, LDLDIRECT in the last 72 hours. Thyroid Function Tests: No results for input(s): TSH, T4TOTAL, FREET4, T3FREE, THYROIDAB in the last 72 hours. Anemia Panel: No results for input(s): VITAMINB12, FOLATE, FERRITIN, TIBC, IRON, RETICCTPCT in the last 72 hours. Urine analysis:    Component Value Date/Time   COLORURINE YELLOW 07/06/2018 2039   APPEARANCEUR CLEAR 07/06/2018 2039   LABSPEC 1.027 07/06/2018 2039   PHURINE 5.0 07/06/2018 2039   GLUCOSEU NEGATIVE 07/06/2018 2039   GLUCOSEU NEGATIVE 02/02/2011 1543   HGBUR NEGATIVE 07/06/2018 2039   [place] NEGATIVE 07/06/2018 2039   [place] NEGATIVE 07/06/2018 2039   PROTEINUR 30 (A) 07/06/2018 2039   UROBILINOGEN 1.0 10/14/2014 1211   NITRITE NEGATIVE 07/06/2018 2039   LEUKOCYTESUR NEGATIVE 07/06/2018 2039    Radiological Exams on Admission: Dg Chest Portable 1 View  Result Date: 07/06/2018 CLINICAL DATA:  Pt arrives to ED per [facility] from [facility] which he is there for rehab for seizures per paperwork. Pt arrives for productive cough and shortness of breath, unknown when it started. Per [place] paperwork, pt is alert and disoriented at baseline EXAM: PORTABLE CHEST 1 VIEW COMPARISON:  06/25/2018 FINDINGS: Heart size is normal. There is patchy infiltrate within the LEFT LOWER lobe, having developed since the previous exam.  Numerous old LEFT rib fractures. Deformity of the LEFT humerus. Heart size is normal. There is atherosclerotic calcification of the thoracic aorta. No pulmonary edema. IMPRESSION: LEFT LOWER lobe infiltrate consistent with infectious  process. Followup PA and lateral chest X-ray is recommended in 3-4 weeks following trial of antibiotic therapy to ensure resolution and exclude underlying malignancy. Electronically Signed   By: Nolon Nations M.D.   On: 07/06/2018 19:54    EKG: Independently reviewed.  Sinus tachycardia with irregular rate, borderline PR prolongation.  Assessment/Plan Principal Problem:   Aspiration pneumonia (HCC) Active Problems:   Seizure disorder (HCC)   HLD (hyperlipidemia)   Acute hypoxemic respiratory failure (HCC)   Acute hypoxemic respiratory failure secondary to aspiration pneumonia -Aspiration risk increased due to increased oral secretions. -Satting well on 6 L of supplemental oxygen via nasal cannula.  Fully awake and alert.  Oriented to self (baseline). -Afebrile.  Labs showing white count 33.3.  Lactic acid 2.1 > 0.9 with IV fluids. Chest x-ray showing left lower lobe infiltrate. -IV Zosyn -IV fluids -Continue to trend lactate  -They were able to draw blood cultures from only one arm in the ED.  Results pending. -UA pending, urine culture pending to rule out concomitant urinary source of infection -N.p.o. at this time -SLP eval -Aspiration precautions -DuoNebs every 6 hours -Continuous pulse ox -CBC in am  Seizure disorder -Continue home Keppra in IV form  Hyperlipidemia -Continue home gemfibrozil if patient passes swallowing eval  DVT prophylaxis: Lovenox Code Status: Full code.  Unable to discuss with patient. Family Communication: No family present at bedside. Disposition Plan: Anticipate discharge to nursing home in 3 to 5 days. Consults called: None Admission status: Inpatient  It is my clinical opinion that admission to INPATIENT is  reasonable and necessary in this 77 y.o. male . presenting with symptoms of worsening productive cough and shortness of breath, concerning for aspiration pneumonia . in the context of PMH including: CVA . with pertinent positives on physical exam including: Supplemental oxygen requirement . and pertinent positives on radiographic and laboratory data including: Leukocytosis, left lower lobe infiltrate on chest x-ray . Workup and treatment include IV antibiotic, IV fluid  Given the aforementioned, the predictability of an adverse outcome is felt to be significant. I expect that the patient will require at least 2 midnights in the hospital to treat this condition.   Shela Leff MD [facility] Pager [phone]  If 7PM-7AM, please contact night-coverage [email or web address] Password [email or web address]  07/07/2018, 3:28 AM

## 2018-07-06 NOTE — ED Notes (Signed)
Gastroccult card ordered from facilities management

## 2018-07-06 NOTE — ED Notes (Addendum)
Per PA Ward, one set of cultures that was drawn before antibiotics were started is sufficient.

## 2018-07-06 NOTE — Progress Notes (Signed)
Location:  [facility] Room Number: 308-A Place of Service:  SNF ([id number])SNF Provider:  Senaida Lange. Medina-Vargas - NP PCP: Flossie Buffy, NP  Patient Care Team: Nche, Charlene Brooke, NP as PCP - General (Internal Medicine)  Extended Emergency Contact Information Primary Emergency Contact: Alvira Monday [place] Mobile Phone: [phone] Relation: Niece Secondary Emergency Contact: Tamala Fothergill [place] Mobile Phone: [phone] Relation: Other  Code Status:   Goals of care: Advanced Directive information Advanced Directives 06/29/2018  Does Patient Have a Medical Advance Directive? No  Type of Advance Directive -  Copy of [place] in Chart? -  Would patient like information on creating a medical advance directive? No - Patient declined  Pre-existing out of facility DNR order (yellow form or pink MOST form) -     Chief Complaint  Patient presents with  . Acute Visit    Shortness of breath    HPI:  Pt is a 77 y.o. male seen today for SOB. He was noted to have O2 sat in the 80s earlier this morning. He was noted to have productive cough. Auscultation of chest revealed left lung field with moderate wheezing. Chest x-ray revealed left lower lobe pneumonia. He was noted to have confusion and was seen to be on the floor in the afternoon. He is able to move all of his extremities without difficulty. He was noted to have left hand skin tears.   Past Medical History:  Diagnosis Date  . Chronic insomnia   . Cigarette smoker   . COPD (chronic obstructive pulmonary disease) ([place])   . Diverticulosis of colon   . Elevated prostate specific antigen (PSA)   . ETOH abuse   . GERD (gastroesophageal reflux disease)   . Hx of colonic polyps   . Hyperlipidemia   . Hypertension   . Pulmonary nodule   . Shortness of breath   . Stroke [facility])    Past Surgical History:  Procedure Laterality Date   . COLONOSCOPY    . needle biopsy RUL nodule  01/2009   benign  . TONSILLECTOMY  1947  . VASECTOMY      No Known Allergies  Allergies as of 07/06/2018   No Known Allergies     Medication List        Accurate as of 07/06/18  4:35 PM. Always use your most recent med list.          aspirin EC 81 MG tablet Take 1 tablet (81 mg total) by mouth daily.   chlordiazePOXIDE 5 MG capsule Commonly known as:  LIBRIUM Please dispense 18 pills - Take 1 pill three times a day for 3 days, then Take 1 pill two times a day for 3 days, then Take 1 pill once a day for 3 days and stop.   cholecalciferol 1000 units tablet Commonly known as:  VITAMIN D Take 1 tablet (1,000 Units total) by mouth daily.   folic acid 1 MG tablet Commonly known as:  FOLVITE Take 1 tablet (1 mg total) by mouth daily.   gemfibrozil 600 MG tablet Commonly known as:  LOPID Take 1 tablet (600 mg total) by mouth 2 (two) times daily.   guaiFENesin 100 MG/5ML liquid Commonly known as:  ROBITUSSIN Take 200 mg by mouth every 4 (four) hours as needed for cough. x48 hours   levETIRAcetam 500 MG tablet Commonly known as:  KEPPRA Take 1 tablet (500 mg total) by mouth 2 (  two) times daily.   nicotine 21 mg/24hr patch Commonly known as:  NICODERM CQ - dosed in mg/24 hours Place 21 mg onto the skin daily.   NUTRITIONAL SUPPLEMENT PLUS Liqd Take 120 mLs by mouth 2 (two) times daily. MedPass   nystatin 100000 UNIT/ML suspension Commonly known as:  MYCOSTATIN Take 5 mLs by mouth 4 (four) times daily.   thiamine 100 MG tablet Take 1 tablet (100 mg total) by mouth daily.       REVIEW OF SYSTEMS    GENERAL: no change in appetite MOUTH and THROAT: Denies oral discomfort, gingival pain or bleeding, pain from teeth or hoarseness   RESPIRATORY: +cough CARDIAC: no chest pain, edema or palpitations GI: no abdominal pain, diarrhea, constipation, heart burn, nausea or vomiting GU: Denies dysuria, frequency, hematuria,  incontinence, or discharge PSYCHIATRIC: Denies feeling of depression or anxiety. No report of hallucinations, insomnia, paranoia, or agitation   Immunization History  Administered Date(s) Administered  . H1N1 10/22/2008  . Influenza Split 06/28/2011, 06/15/2012  . Influenza Whole 07/26/2007, 08/11/2010  . Influenza, High Dose Seasonal PF 08/27/2016  . Pneumococcal Polysaccharide-23 08/10/2010, 08/27/2016  . Tdap 06/15/2012, 05/08/2013, 10/14/2014   Pertinent  Health Maintenance Due  Topic Date Due  . OPHTHALMOLOGY EXAM  05/12/1951  . COLONOSCOPY  07/26/2016  . HEMOGLOBIN A1C  02/25/2017  . FOOT EXAM  08/27/2017  . URINE MICROALBUMIN  08/27/2017  . PNA vac Low Risk Adult (2 of 2 - PCV13) 08/27/2017  . INFLUENZA VACCINE  04/27/2018    Vitals:   07/06/18 1633  BP: 126/72  Pulse: 92  Resp: 17  Temp: (!) 97.4 F (36.3 C)  TempSrc: Oral  SpO2: 92%  Weight: 151 lb 3.2 oz (68.6 kg)  Height: 5\' 11"  (1.803 m)   Body mass index is 21.09 kg/m. PHYSICAL EXAMINATION  GENERAL APPEARANCE:  In no acute distress.  SKIN:  Left hand with 2 skin tears MOUTH and THROAT: Lips are without lesions. Oral mucosa is moist and without lesions.  RESPIRATORY: wheezing on left lung field, O2 @ 2L/min via [place] CARDIAC: RRR, no murmur,no extra heart sounds, no edema GI: abdomen soft, normal BS, no masses, no tenderness EXTREMITIES:  Able to move X 4 extremities NEUROLOGICAL: There is no tremor.  PSYCHIATRIC:  Affect and behavior are appropriate   Labs reviewed: Recent Labs    01/07/18 1340  01/07/18 1558  06/25/18 1537 06/26/18 0521 06/27/18 0401  NA 134*   < >  --    < > 138 138 137  K 3.8   < >  --    < > 4.4 3.8 3.9  CL 101   < >  --    < > 108 108 109  CO2 22  --   --    < > 21* 23 24  GLUCOSE 76   < >  --    < > 93 89 110*  BUN 8   < >  --    < > 7* 7* 10  CREATININE 0.85   < >  --    < > 0.70 0.83 0.83  CALCIUM 8.9  --   --    < > 8.7* 8.5* 8.3*  MG 1.9  --   --   --   --  2.0  1.8  PHOS  --   --  2.8  --   --  3.2  --    < > = values in this interval not displayed.  Recent Labs    01/08/18 0627 06/25/18 1537 06/26/18 0521  AST 22 23 21   ALT 10* 11 11  ALKPHOS 60 60 54  BILITOT 1.1 0.8 1.2  PROT 5.4* 5.8* 5.4*  ALBUMIN 3.1* 3.5 3.3*   Recent Labs    01/07/18 1340 01/07/18 1344 06/25/18 1537 06/26/18 0521  WBC 10.5  --  7.6 10.1  NEUTROABS 8.8*  --  5.7  --   HGB 15.9 16.3 15.7 14.5  HCT 47.1 48.0 47.8 44.0  MCV 101.1*  --  105.8* 105.3*  PLT 193  --  214 218   Lab Results  Component Value Date   TSH 0.347 (L) 07/04/2012   Lab Results  Component Value Date   HGBA1C 4.5 (L) 08/27/2016   Lab Results  Component Value Date   CHOL 165 08/27/2016   HDL 52.20 08/27/2016   LDLCALC 96 08/27/2016   LDLDIRECT 79.9 09/30/2010   TRIG 82.0 08/27/2016   CHOLHDL 3 08/27/2016    Significant Diagnostic Results in last 30 days:  Dg Chest 2 View  Result Date: 06/25/2018 CLINICAL DATA:  Cough EXAM: CHEST - 2 VIEW COMPARISON:  10/14/2014 FINDINGS: There is no focal parenchymal opacity. There is no pleural effusion or pneumothorax. The heart and mediastinal contours are unremarkable. There is a healed old posttraumatic deformity of left proximal humerus. There is an age-indeterminate L1 vertebral body compression fracture. IMPRESSION: No active cardiopulmonary disease. Electronically Signed   By: Kathreen Devoid   On: 06/25/2018 16:31   Ct Head Wo Contrast  Result Date: 06/25/2018 CLINICAL DATA:  Left-sided weakness and altered mental status EXAM: CT HEAD WITHOUT CONTRAST TECHNIQUE: Contiguous axial images were obtained from the base of the skull through the vertex without intravenous contrast. COMPARISON:  January 07, 2018 FINDINGS: Brain: Moderate diffuse atrophy is stable. There is no intracranial mass, hemorrhage, extra-axial fluid collection, or midline shift. There is patchy small vessel disease in the centra semiovale bilaterally. No new brain parenchymal  lesions evident. No acute infarct is appreciable. Vascular: No hyperdense vessels evident. There is calcification in each carotid siphon region. Skull: Bony calvarium appears intact. Sinuses/Orbits: There is a small retention cyst in the inferomedial left maxillary antrum. There is mucosal thickening in several ethmoid air cells bilaterally. Postoperative changes are noted in the left lateral orbital wall and anterior left maxillary antrum. Orbits appear symmetric bilaterally. There is evidence of previous cataract removals bilaterally. Other: Mastoid air cells are clear. There is debris in each external auditory canal. IMPRESSION: Atrophy with patchy periventricular small vessel disease. No evident acute infarct. No mass or hemorrhage. There are foci of arterial vascular calcification. Foci of paranasal sinus disease noted with areas of postoperative change in the left facial region. There is probable cerumen in each external auditory canal. Electronically Signed   By: Lowella Grip III M.D.   On: 06/25/2018 16:01   Mr Brain Wo Contrast  Result Date: 06/27/2018 CLINICAL DATA:  77 year old male with left side weakness and altered mental status. EXAM: MRI HEAD WITHOUT CONTRAST TECHNIQUE: Multiplanar, multiecho pulse sequences of the brain and surrounding structures were obtained without intravenous contrast. COMPARISON:  Head CT 06/25/2018.  Brain MRI 10/15/2014. FINDINGS: Brain: Stable cerebral volume since the prior MRI. No restricted diffusion to suggest acute infarction. No midline shift, mass effect, evidence of mass lesion, ventriculomegaly, extra-axial collection or acute intracranial hemorrhage. Cervicomedullary junction and pituitary are within normal limits. Stable gray-white matter differentiation throughout the brain. Prominent mineralization of the bilateral basal ganglia. Mild  for age patchy cerebral white matter T2 and FLAIR hyperintensity. No cortical encephalomalacia or definite chronic  cerebral blood products identified. No new signal abnormality. Vascular: Major intracranial vascular flow voids are stable since 2016. Skull and upper cervical spine: Chronic ligamentous hypertrophy about the odontoid. Normal visible bone marrow signal. Sinuses/Orbits: Stable and negative. Other: Mastoid air cells remain clear. Visible internal auditory structures appear normal. Scalp and face soft tissues appear negative. IMPRESSION: No acute intracranial abnormality and stable MRI appearance of the brain since 2016. Electronically Signed   By: Genevie Ann M.D.   On: 06/27/2018 08:24    Assessment/Plan  1. HCAP (healthcare-associated pneumonia) -chest x-ray showed left lower lobe pneumonia, will start Rocephin 1 g IM daily x7 days and Florastor 250 mg 1 capsule twice daily x10 days, DuoNeb 1 neb q. 6 AM, 2 PM 10 PM x5 days and Q 6 hours PRN for SOB/wheezing, O2 @ 2L/min via [place]   2. Dysphagia, unspecified type - currently having speech therapy, aspiration precautions, continue pure diet with honey thickened liquids   3. Fall, initial encounter - monitor patient frequently, fall precautions    Family/ staff Communication: Discussed plan of care with patient.  Labs/tests ordered:  Chest x-ray  Goal of care:  [place]-term care

## 2018-07-07 ENCOUNTER — Inpatient Hospital Stay (HOSPITAL_COMMUNITY): Payer: Medicare Other

## 2018-07-07 DIAGNOSIS — J690 Pneumonitis due to inhalation of food and vomit: Secondary | ICD-10-CM | POA: Diagnosis present

## 2018-07-07 DIAGNOSIS — J9602 Acute respiratory failure with hypercapnia: Secondary | ICD-10-CM

## 2018-07-07 DIAGNOSIS — J9601 Acute respiratory failure with hypoxia: Secondary | ICD-10-CM

## 2018-07-07 DIAGNOSIS — G40909 Epilepsy, unspecified, not intractable, without status epilepticus: Secondary | ICD-10-CM

## 2018-07-07 DIAGNOSIS — E785 Hyperlipidemia, unspecified: Secondary | ICD-10-CM | POA: Diagnosis present

## 2018-07-07 DIAGNOSIS — Z01818 Encounter for other preprocedural examination: Secondary | ICD-10-CM

## 2018-07-07 DIAGNOSIS — J189 Pneumonia, unspecified organism: Secondary | ICD-10-CM

## 2018-07-07 LAB — CBC
HCT: 36.6 % — ABNORMAL LOW (ref 39.0–52.0)
HEMOGLOBIN: 11.4 g/dL — AB (ref 13.0–17.0)
MCH: 33.8 pg (ref 26.0–34.0)
MCHC: 31.1 g/dL (ref 30.0–36.0)
MCV: 108.6 fL — ABNORMAL HIGH (ref 80.0–100.0)
NRBC: 0 % (ref 0.0–0.2)
PLATELETS: 272 10*3/uL (ref 150–400)
RBC: 3.37 MIL/uL — AB (ref 4.22–5.81)
RDW: 14.3 % (ref 11.5–15.5)
WBC: 23.3 10*3/uL — ABNORMAL HIGH (ref 4.0–10.5)

## 2018-07-07 LAB — BLOOD GAS, ARTERIAL
ACID-BASE DEFICIT: 1.3 mmol/L (ref 0.0–2.0)
Acid-base deficit: 2.5 mmol/L — ABNORMAL HIGH (ref 0.0–2.0)
Bicarbonate: 27.1 mmol/L (ref 20.0–28.0)
Bicarbonate: 23.7 mmol/L (ref 20.0–28.0)
DRAWN BY: 51155
Drawn by: 404151
FIO2: 100
FIO2: 100
MECHVT: 490 mL
O2 SAT: 91.8 %
O2 SAT: 98.9 %
PCO2 ART: 56.1 mmHg — AB (ref 32.0–48.0)
PEEP: 5 cmH2O
PH ART: 7.122 — AB (ref 7.350–7.450)
Patient temperature: 98.6
Patient temperature: 98.6
RATE: 20 resp/min
pCO2 arterial: 86.9 mmHg (ref 32.0–48.0)
pH, Arterial: 7.249 — ABNORMAL LOW (ref 7.350–7.450)
pO2, Arterial: 76.4 mmHg — ABNORMAL LOW (ref 83.0–108.0)
pO2, Arterial: 185 mmHg — ABNORMAL HIGH (ref 83.0–108.0)

## 2018-07-07 LAB — URINALYSIS, ROUTINE W REFLEX MICROSCOPIC
Bilirubin Urine: NEGATIVE
Glucose, UA: NEGATIVE mg/dL
HGB URINE DIPSTICK: NEGATIVE
Ketones, ur: NEGATIVE mg/dL
Leukocytes, UA: NEGATIVE
Nitrite: NEGATIVE
PROTEIN: 30 mg/dL — AB
Specific Gravity, Urine: 1.027 (ref 1.005–1.030)
pH: 5 (ref 5.0–8.0)

## 2018-07-07 LAB — GLUCOSE, CAPILLARY
GLUCOSE-CAPILLARY: 137 mg/dL — AB (ref 70–99)
GLUCOSE-CAPILLARY: 60 mg/dL — AB (ref 70–99)
Glucose-Capillary: 101 mg/dL — ABNORMAL HIGH (ref 70–99)
Glucose-Capillary: 75 mg/dL (ref 70–99)

## 2018-07-07 LAB — LACTIC ACID, PLASMA
LACTIC ACID, VENOUS: 0.9 mmol/L (ref 0.5–1.9)
Lactic Acid, Venous: 0.8 mmol/L (ref 0.5–1.9)

## 2018-07-07 LAB — BASIC METABOLIC PANEL
ANION GAP: 8 (ref 5–15)
BUN: 15 mg/dL (ref 8–23)
CALCIUM: 6.3 mg/dL — AB (ref 8.9–10.3)
CO2: 22 mmol/L (ref 22–32)
CREATININE: 0.72 mg/dL (ref 0.61–1.24)
Chloride: 112 mmol/L — ABNORMAL HIGH (ref 98–111)
GFR calc non Af Amer: >60 mL/min (ref >60)
GLUCOSE: 86 mg/dL (ref 70–99)
Potassium: 3.7 mmol/L (ref 3.5–5.1)
Sodium: 142 mmol/L (ref 135–145)

## 2018-07-07 LAB — MRSA PCR SCREENING: MRSA by PCR: NEGATIVE

## 2018-07-07 MED ORDER — MIDAZOLAM HCL 2 MG/2ML IJ SOLN
INTRAMUSCULAR | Status: AC
  Administered 2018-07-07: 2 mg via INTRAVENOUS
  Filled 2018-07-07: qty 2

## 2018-07-07 MED ORDER — MIDAZOLAM HCL 2 MG/2ML IJ SOLN
1.0000 mg | INTRAMUSCULAR | Status: DC | PRN
  Administered 2018-07-10 – 2018-07-16 (×14): 1 mg via INTRAVENOUS
  Filled 2018-07-07 (×15): qty 2

## 2018-07-07 MED ORDER — MIDAZOLAM HCL 2 MG/2ML IJ SOLN
1.0000 mg | INTRAMUSCULAR | Status: DC | PRN
  Administered 2018-07-11 (×2): 1 mg via INTRAVENOUS
  Filled 2018-07-07 (×2): qty 2

## 2018-07-07 MED ORDER — PHENYLEPHRINE HCL-NACL 10-0.9 MG/250ML-% IV SOLN
0.0000 ug/min | INTRAVENOUS | Status: DC
  Administered 2018-07-07: 100 ug/min via INTRAVENOUS
  Administered 2018-07-07: 30 ug/min via INTRAVENOUS
  Administered 2018-07-07: 70 ug/min via INTRAVENOUS
  Administered 2018-07-07: 40 ug/min via INTRAVENOUS
  Administered 2018-07-07: 13.333 ug/min via INTRAVENOUS
  Administered 2018-07-07: 20 ug/min via INTRAVENOUS
  Administered 2018-07-08: 30 ug/min via INTRAVENOUS
  Filled 2018-07-07 (×10): qty 250

## 2018-07-07 MED ORDER — VITAMIN D 1000 UNITS PO TABS
1000.0000 [IU] | ORAL_TABLET | Freq: Every day | ORAL | Status: DC
  Administered 2018-07-07 – 2018-07-24 (×18): 1000 [IU] via ORAL
  Filled 2018-07-07 (×23): qty 1

## 2018-07-07 MED ORDER — ENOXAPARIN SODIUM 40 MG/0.4ML ~~LOC~~ SOLN
40.0000 mg | Freq: Every day | SUBCUTANEOUS | Status: DC
  Administered 2018-07-07 – 2018-07-19 (×13): 40 mg via SUBCUTANEOUS
  Filled 2018-07-07 (×13): qty 0.4

## 2018-07-07 MED ORDER — DEXTROSE 50 % IV SOLN
INTRAVENOUS | Status: AC
  Administered 2018-07-07: 25 mL
  Filled 2018-07-07: qty 50

## 2018-07-07 MED ORDER — FOLIC ACID 1 MG PO TABS: 1.0000 mg | ORAL_TABLET | Freq: Every day | ORAL | Status: DC

## 2018-07-07 MED ORDER — FENTANYL CITRATE (PF) 100 MCG/2ML IJ SOLN: 50.0000 ug | Freq: Once | INTRAMUSCULAR | Status: AC

## 2018-07-07 MED ORDER — SODIUM CHLORIDE 0.9 % IV SOLN
INTRAVENOUS | Status: DC | PRN
  Administered 2018-07-07: 250 mL via INTRAVENOUS

## 2018-07-07 MED ORDER — BOOST / RESOURCE BREEZE PO LIQD CUSTOM: 237.0000 mL | Freq: Two times a day (BID) | ORAL | Status: DC

## 2018-07-07 MED ORDER — LACTATED RINGERS IV BOLUS
1000.0000 mL | Freq: Once | INTRAVENOUS | Status: AC
  Administered 2018-07-07: 1000 mL via INTRAVENOUS

## 2018-07-07 MED ORDER — SODIUM CHLORIDE 0.9 % IV BOLUS
500.0000 mL | Freq: Once | INTRAVENOUS | Status: AC
  Administered 2018-07-07: 500 mL via INTRAVENOUS

## 2018-07-07 MED ORDER — FAMOTIDINE IN NACL 20-0.9 MG/50ML-% IV SOLN
20.0000 mg | Freq: Two times a day (BID) | INTRAVENOUS | Status: DC
  Administered 2018-07-07 – 2018-07-10 (×7): 20 mg via INTRAVENOUS
  Filled 2018-07-07 (×7): qty 50

## 2018-07-07 MED ORDER — MIDAZOLAM HCL 2 MG/2ML IJ SOLN
2.0000 mg | Freq: Once | INTRAMUSCULAR | Status: AC
  Administered 2018-07-07: 2 mg via INTRAVENOUS

## 2018-07-07 MED ORDER — BISACODYL 10 MG RE SUPP: 10.0000 mg | Freq: Once | RECTAL | Status: DC | PRN

## 2018-07-07 MED ORDER — MAGNESIUM HYDROXIDE 400 MG/5ML PO SUSP: 30.0000 mL | Freq: Once | ORAL | Status: DC | PRN

## 2018-07-07 MED ORDER — ASPIRIN 81 MG PO CHEW
81.0000 mg | CHEWABLE_TABLET | Freq: Every day | ORAL | Status: DC
  Administered 2018-07-08 – 2018-07-18 (×11): 81 mg
  Filled 2018-07-07 (×10): qty 1

## 2018-07-07 MED ORDER — ETOMIDATE 2 MG/ML IV SOLN
20.0000 mg | Freq: Once | INTRAVENOUS | Status: AC
  Administered 2018-07-07: 20 mg via INTRAVENOUS

## 2018-07-07 MED ORDER — SODIUM CHLORIDE 0.9 % IV SOLN
INTRAVENOUS | Status: DC
  Administered 2018-07-07: 01:00:00 via INTRAVENOUS
  Administered 2018-07-07: 1000 mL via INTRAVENOUS
  Administered 2018-07-07: 04:00:00 via INTRAVENOUS
  Administered 2018-07-07: 950 mL via INTRAVENOUS
  Administered 2018-07-08 – 2018-07-21 (×9): via INTRAVENOUS

## 2018-07-07 MED ORDER — CHLORHEXIDINE GLUCONATE 0.12% ORAL RINSE (MEDLINE KIT)
15.0000 mL | Freq: Two times a day (BID) | OROMUCOSAL | Status: DC
  Administered 2018-07-07 – 2018-07-16 (×20): 15 mL via OROMUCOSAL

## 2018-07-07 MED ORDER — GEMFIBROZIL 600 MG PO TABS
600.0000 mg | ORAL_TABLET | Freq: Two times a day (BID) | ORAL | Status: DC
  Filled 2018-07-07: qty 1

## 2018-07-07 MED ORDER — ORAL CARE MOUTH RINSE
15.0000 mL | OROMUCOSAL | Status: DC
  Administered 2018-07-07 – 2018-07-16 (×95): 15 mL via OROMUCOSAL

## 2018-07-07 MED ORDER — FENTANYL BOLUS VIA INFUSION
25.0000 ug | INTRAVENOUS | Status: DC | PRN
  Administered 2018-07-07 – 2018-07-11 (×7): 25 ug via INTRAVENOUS
  Filled 2018-07-07: qty 25

## 2018-07-07 MED ORDER — IPRATROPIUM-ALBUTEROL 0.5-2.5 (3) MG/3ML IN SOLN
3.0000 mL | Freq: Four times a day (QID) | RESPIRATORY_TRACT | Status: DC
  Administered 2018-07-07 – 2018-07-16 (×39): 3 mL via RESPIRATORY_TRACT
  Filled 2018-07-07 (×39): qty 3

## 2018-07-07 MED ORDER — VITAMIN B-1 100 MG PO TABS: 100.0000 mg | ORAL_TABLET | Freq: Every day | ORAL | Status: DC

## 2018-07-07 MED ORDER — GEMFIBROZIL 600 MG PO TABS
600.0000 mg | ORAL_TABLET | Freq: Two times a day (BID) | ORAL | Status: DC
  Administered 2018-07-07 – 2018-07-18 (×18): 600 mg
  Filled 2018-07-07 (×23): qty 1

## 2018-07-07 MED ORDER — FENTANYL 2500MCG IN NS 250ML (10MCG/ML) PREMIX INFUSION
25.0000 ug/h | INTRAVENOUS | Status: DC
  Administered 2018-07-07: 50 ug/h via INTRAVENOUS
  Administered 2018-07-08: 75 ug/h via INTRAVENOUS
  Administered 2018-07-09: 100 ug/h via INTRAVENOUS
  Administered 2018-07-10: 75 ug/h via INTRAVENOUS
  Filled 2018-07-07 (×4): qty 250

## 2018-07-07 MED ORDER — FENTANYL CITRATE (PF) 100 MCG/2ML IJ SOLN
INTRAMUSCULAR | Status: AC
  Administered 2018-07-07: 100 ug
  Filled 2018-07-07: qty 2

## 2018-07-07 MED ORDER — PIPERACILLIN-TAZOBACTAM 3.375 G IVPB
3.3750 g | Freq: Three times a day (TID) | INTRAVENOUS | Status: AC
  Administered 2018-07-07 – 2018-07-16 (×30): 3.375 g via INTRAVENOUS
  Filled 2018-07-07 (×30): qty 50

## 2018-07-07 MED ORDER — NICOTINE 21 MG/24HR TD PT24
21.0000 mg | MEDICATED_PATCH | Freq: Every day | TRANSDERMAL | Status: DC
  Administered 2018-07-07 – 2018-07-24 (×18): 21 mg via TRANSDERMAL
  Filled 2018-07-07 (×18): qty 1

## 2018-07-07 MED ORDER — ASPIRIN 81 MG PO CHEW
81.0000 mg | CHEWABLE_TABLET | Freq: Every day | ORAL | Status: DC
  Administered 2018-07-07: 81 mg via ORAL
  Filled 2018-07-07: qty 1

## 2018-07-07 MED ORDER — FENTANYL CITRATE (PF) 100 MCG/2ML IJ SOLN: 100.0000 ug | Freq: Once | INTRAMUSCULAR | Status: DC

## 2018-07-07 MED ORDER — LEVETIRACETAM 500 MG PO TABS: 500.0000 mg | ORAL_TABLET | Freq: Two times a day (BID) | ORAL | Status: DC

## 2018-07-07 MED ORDER — FOLIC ACID 5 MG/ML IJ SOLN
1.0000 mg | Freq: Every day | INTRAMUSCULAR | Status: DC
  Administered 2018-07-07 – 2018-07-10 (×4): 1 mg via INTRAVENOUS
  Filled 2018-07-07 (×5): qty 0.2

## 2018-07-07 MED ORDER — SODIUM CHLORIDE 0.9 % IV SOLN
1.0000 g | Freq: Once | INTRAVENOUS | Status: AC
  Administered 2018-07-07: 1 g via INTRAVENOUS
  Filled 2018-07-07: qty 10

## 2018-07-07 MED ORDER — THIAMINE HCL 100 MG/ML IJ SOLN
100.0000 mg | Freq: Every day | INTRAMUSCULAR | Status: DC
  Administered 2018-07-07 – 2018-07-10 (×4): 100 mg via INTRAVENOUS
  Filled 2018-07-07 (×4): qty 2

## 2018-07-07 MED ORDER — NOREPINEPHRINE 4 MG/250ML-% IV SOLN: 0.0000 ug/min | INTRAVENOUS | Status: DC

## 2018-07-07 MED ORDER — LEVETIRACETAM IN NACL 500 MG/100ML IV SOLN
500.0000 mg | Freq: Two times a day (BID) | INTRAVENOUS | Status: DC
  Administered 2018-07-07 – 2018-07-18 (×23): 500 mg via INTRAVENOUS
  Filled 2018-07-07 (×24): qty 100

## 2018-07-07 NOTE — Progress Notes (Signed)
Initial Nutrition Assessment  DOCUMENTATION CODES:   Non-severe (moderate) malnutrition in context of chronic illness  INTERVENTION:   If unable to extubate patient within the next 24 hours, recommend begin TF via OGT:  Vital AF 1.2 at 50 ml/h (1200 ml per day)  Pro-stat 30 ml once daily  Provides 1540 kcal, 105 gm protein, 973 ml free water daily  NUTRITION DIAGNOSIS:   Moderate Malnutrition related to chronic illness(COPD) as evidenced by moderate fat depletion, severe fat depletion, mild muscle depletion, moderate muscle depletion.  GOAL:   Patient will meet greater than or equal to 90% of their needs  MONITOR:   Vent status, Skin, Labs, I & O's  REASON FOR ASSESSMENT:   Ventilator, Malnutrition Screening Tool    ASSESSMENT:   77 yo male with PMH of COPD, pulmonary nodule, smoker, seizures, HTN, HLD, GERD, alcohol abuse, stroke who was admitted on 10/10 with HCAP. Respiratory status declined and required transfer to the ICU for intubation on 10/11.  Patient is currently intubated on ventilator support; OGT in place. MV: 6.5 L/min Temp (24hrs), Avg:98 F (36.7 C), Min:96.6 F (35.9 C), Max:99 F (37.2 C)    Labs reviewed. Medications reviewed and include Vitamin D, folic acid, thiamine, and neosynephrine.  Per review of weight encounters, weight has fluctuated over the past 6 months, but no significant weight changes noted.  NUTRITION - FOCUSED PHYSICAL EXAM:    Most Recent Value  Orbital Region  Moderate depletion  Upper Arm Region  Moderate depletion  Thoracic and Lumbar Region  Severe depletion  Buccal Region  Unable to assess  Temple Region  Moderate depletion  Clavicle Bone Region  Moderate depletion  Clavicle and Acromion Bone Region  Moderate depletion  Scapular Bone Region  Unable to assess  Dorsal Hand  Mild depletion  Patellar Region  Moderate depletion  Anterior Thigh Region  Moderate depletion  Posterior Calf Region  Moderate depletion   Edema (RD Assessment)  None  Hair  Reviewed  Eyes  Unable to assess  Mouth  Unable to assess  Skin  Reviewed  Nails  Reviewed       Diet Order:   Diet Order            Diet NPO time specified  Diet effective now              EDUCATION NEEDS:   No education needs have been identified at this time  Skin:  Skin Assessment: Reviewed RN Assessment  Last BM:  PTA  Height:   Ht Readings from Last 1 Encounters:  07/07/18 5\' 11"  (1.803 m)    Weight:   Wt Readings from Last 1 Encounters:  07/06/18 67.8 kg    Ideal Body Weight:  78.2 kg  BMI:  Body mass index is 20.85 kg/m.  Estimated Nutritional Needs:   Kcal:  0177  Protein:  100-115 gm  Fluid:  1.8-2 L    Molli Barrows, RD, LDN, [place] Pager [phone] After Hours Pager [phone]

## 2018-07-07 NOTE — Progress Notes (Signed)
Pt transported from 2W16 to 2M05 on NRB to be intubated.

## 2018-07-07 NOTE — Progress Notes (Signed)
Called by bedside RN with concerns for SOB, desaturation, and AMS within a 30 minute time period. Pt saturation levels 60-70's.. On assessment, pt LS severely diminished and agonal breathing noted. ABG shows a pH of 7.122, CO2 86.9, PO2 76.4. Will contact CCM for further management and probable intubation  Respiratory Distress - ABG - CCM notified for possible intubation  Lovey Newcomer, NP [facility] 7p-7a [phone]

## 2018-07-07 NOTE — Progress Notes (Signed)
Went in to see patient just before 02:00, found patient with shallow breathing, coarse crackles, gurgling sounds in throat, oxygen sat 64% on 6L nasal canula, lips and fingertips blue, patient responding to verbal and painful stimuli, opening eyes but only making incomprehensible sounds, oral suction attempted but was only able to get up small amount of tenacious secretions, respiratory therapy called, placed on 100% non-rebreather, oxygen saturations slowly up to 91-92%, Rapid response called, patient then NT suctioned with minimal results, oxygen sats up to 97-98% remaining on non-rebreather, patient still with shallow intermittent agonal breathing, NP at bedside, next of kin called and left message, patient transferred to ICU at 02:53.

## 2018-07-07 NOTE — Progress Notes (Signed)
Chaplain responded to spiritual care consult. It said palliative care in note. Pt was ventilated, with eyes closed. Chaplain offered silent prayer and will refer pt to unit chaplain as well as palliative care chaplain. Tamsen Snider  [phone]

## 2018-07-07 NOTE — Procedures (Signed)
Intubation Procedure Note Blake [name] [id number] Mar 03, 1941  Procedure: Intubation Indications: Respiratory insufficiency  Procedure Details Consent: Unable to obtain consent because of emergent medical necessity. Time Out: Verified patient identification, verified procedure, site/side was marked, verified correct patient position, special equipment/implants available, medications/allergies/relevent history reviewed, required imaging and test results available.  Performed  Drugs:  100 mcg Fentanyl, 2 mg Versed, 20 mg Etomidate. VL x 1 with # 3 blade. Grade 1 view. 7.5 tube visualized passing through vocal cords. Following intubation:  positive color change on ETCO2, condensation seen in endotracheal tube, equal breath sounds bilaterally.  Evaluation Hemodynamic Status: Transient hypotension treated with fluid; O2 sats: stable throughout Patient's Current Condition: stable Complications: No apparent complications Patient did tolerate procedure well. Chest X-ray ordered to verify placement.  CXR: tube position acceptable.   Blake [name], [place] [facility] Pager: [phone]  or [phone] 07/07/2018, 5:15 AM

## 2018-07-07 NOTE — Significant Event (Signed)
Rapid Response Event Note Called by Nursing staff for acute hypoxia sats 60% and AMS  Overview: Time Called: 0208 Arrival Time: 0215 Event Type: Respiratory  Initial Focused Assessment: Blake [name] is a very ill appearing man lying in the bed arousable to only noxious stimulus.  Pt has labored breathing with an inability to protect his airway.  Blake [name] has been notified and awaiting response.  Pt is sitting upright in the bed with a NRB mask and sats currently 92%.  NTS x1 and thick tenacious purulent sections with some blood obtained.  Sats increased to 96-97% on NRB.  ABG obtained.  [name] to bedside and PCCM consulted for transfer to ICU.  Interventions: -NRB mask -NTS x1 -tx to 22m05    Event Summary: Pt transferred to 36m05 and primary care handed off to [name] ended 0320  [name]

## 2018-07-07 NOTE — Progress Notes (Signed)
SLP Cancellation Note  Patient Details Name: Blake [name] MRN: [id number] DOB: [birth year]   Cancelled treatment:       Reason Eval/Treat Not Completed: Medical issues which prohibited therapy. Pt now intubated. Will sign off, please reorder when pt progresses.    Melaney Tellefsen, Katherene Ponto 07/07/2018, 9:58 AM

## 2018-07-07 NOTE — Consult Note (Signed)
NAME:  Blake [name], MRN:  [id number], DOB:  Jan 29, 1941, LOS: 1 ADMISSION DATE:  07/06/2018, CONSULTATION DATE:  07/07/18 REFERRING MD:  Marlowe Sax CHIEF COMPLAINT:  AMS   Brief History   Blake [name] is a 77 y.o. male from [facility] who was admitted with HCAP and later developed worsening hypoxia with hypercapnia.  Deemed to not be a candidate for BiPAP due to secretions; therefore, transferred to the ICU for intubation.  Significant Hospital Events   10/10 > admit. 10/11 > transfer to ICU for intubation.  Consults: date of consult/date signed off & final recs:  PCCM.  Procedures (surgical and bedside):  ETT 10/11 >   Significant Diagnostic Tests:  CXR 10/10 > LLL infiltrate.  Micro Data: Blood 10/10 >  Sputum 10/10 >  Urine 10/10 >   Antimicrobials:  Zosyn 10/10 >   Subjective:  On NRB, unresponsive.  Objective   Blood pressure (!) 119/50, pulse (!) 102, temperature (!) 97.5 F (36.4 C), temperature source Axillary, resp. rate 20, height 5\' 11"  (1.803 m), weight 67.8 kg, SpO2 98 %.        Intake/Output Summary (Last 24 hours) at 07/07/2018 0251 Last data filed at 07/07/2018 7829 Gross per 24 hour  Intake 2291.72 ml  Output 50 ml  Net 2241.72 ml   Filed Weights   07/06/18 2234  Weight: 67.8 kg    Examination: General: Adult male, chronically ill appearing, unresponsive. Neuro: Unresponsive. HEENT: [place]/AT. Sclerae anicteric.  NRB in place. Cardiovascular: RRR, no M/R/G.  Lungs: Respirations labored and paradoxical.  Coarse bilaterally L > R. Abdomen: BS x 4, soft, NT/ND.  Musculoskeletal: No gross deformities, no edema.  Skin: Intact, warm, no rashes.  Assessment & Plan:   Acute hypercapnic and hypoxemic respiratory failure - not a candidate for BiPAP due to secretions. - Intubate now. - F/u ABG.  HCAP. - Continue zosyn. - Ok to hold vanc given negative MRSA PCR. - Follow cultures.  Hypotension - presumed due to RSI as was  normotensive prior. - 500cc bolus now, might need additional.  Hx COPD, tobacco dependence. - Continue preadmission Duonebs, nicotine patch.  Hx Stroke. - Continue preadmission ASA.  Hx Seizures. - Continue preadmission keppra.  Hx HTN, HLD. - Continue preadmission gemfibrozil.  Hx EtOH abuse. - Thiamine / Folate.  Disposition / Summary of Today's Plan 07/07/18   ICU. Continue vent support and f/u on repeat ABG. Continue zosyn.    Diet: NPO. Pain/Anxiety/Delirium protocol (if indicated): Fentanyl gtt / Midazolam PRN. VAP protocol (if indicated): In place. DVT prophylaxis: SCD's / Lovenox. GI prophylaxis: famotidine. Hyperglycemia protocol: None. Mobility: Bedrest. Code Status: Full. Family Communication: None available.  Labs   CBC: Recent Labs  Lab 07/06/18 1932  WBC 33.3*  NEUTROABS 30.3*  HGB 15.2  HCT 47.2  MCV 105.8*  PLT 562   Basic Metabolic Panel: Recent Labs  Lab 07/06/18 1932  NA 136  K 4.3  CL 99  CO2 25  GLUCOSE 143*  BUN 15  CREATININE 0.86  CALCIUM 8.9   GFR: Estimated Creatinine Clearance: 69 mL/min (by C-G formula based on SCr of 0.86 mg/dL). Recent Labs  Lab 07/06/18 1932 07/06/18 2047 07/07/18 0116  WBC 33.3*  --   --   LATICACIDVEN  --  2.12* 0.9   Liver Function Tests: No results for input(s): AST, ALT, ALKPHOS, BILITOT, PROT, ALBUMIN in the last 168 hours. No results for input(s): LIPASE, AMYLASE in the last 168 hours. No results for  input(s): AMMONIA in the last 168 hours. ABG    Component Value Date/Time   PHART 7.122 (LL) 07/07/2018 0158   PCO2ART 86.9 (HH) 07/07/2018 0158   PO2ART 76.4 (L) 07/07/2018 0158   HCO3 27.1 07/07/2018 0158   TCO2 25 01/07/2018 1344   ACIDBASEDEF 1.3 07/07/2018 0158   O2SAT 91.8 07/07/2018 0158    Coagulation Profile: No results for input(s): INR, PROTIME in the last 168 hours. Cardiac Enzymes: No results for input(s): CKTOTAL, CKMB, CKMBINDEX, TROPONINI in the last 168  hours. HbA1C: Hgb A1c MFr Bld  Date/Time Value Ref Range Status  08/27/2016 10:20 AM 4.5 (L) 4.6 - 6.5 % Final    Comment:    Glycemic Control Guidelines for People with Diabetes:Non Diabetic:  <6%Goal of Therapy: <7%Additional Action Suggested:  >8%   10/15/2014 03:17 AM 4.7 <5.7 % Final    Comment:    (NOTE)                                                                       According to the ADA Clinical Practice Recommendations for 2011, when HbA1c is used as a screening test:  >=6.5%   Diagnostic of Diabetes Mellitus           (if abnormal result is confirmed) 5.7-6.4%   Increased risk of developing Diabetes Mellitus References:Diagnosis and Classification of Diabetes Mellitus,Diabetes VQMG,8676,19(JKDTO 1):S62-S69 and Standards of Medical Care in         Diabetes - 2011,Diabetes IZTI,4580,99 (Suppl 1):S11-S61.    CBG: No results for input(s): GLUCAP in the last 168 hours.  Admitting History of Present Illness.   Pt is encephelopathic; therefore, this HPI is obtained from chart review. Blake [name] is a 77 y.o. male who has a PMH as outlined below and who resides at [facility].  He presented to [facility] ED on 10/10 with worsening cough, SOB, AMS.    He was seen at [facility] earlier that day and was found to have hypoxia with sats in 80s.  CXR showed LLL PNA.  Later in the day, he began to have AMS then was sent to ED.  In ED, he had copious secretions.  He required NRB to maintain SpO2 > 90%.  He was admitted by [facility] to SDU. Early AM 10/11, he became less responsive.  ABG demonstrated acute hypercapnic and hypoxemic respiratory failure (7.12 / 87 / 76).  Due to his secretions, he was deemed to not be a candidate for BiPAP. PCCM was subsequently consulted for concerns pt would require intubation.  He had recent admission 06/25/18 through 06/29/18 for seizures and EtOH withdrawal and was discharged to [facility] for continued rehab efforts.  Review of Systems:    Unable to obtain as pt is encephalopathic.  Past medical history  He,  has a past medical history of Chronic insomnia, Cigarette smoker, COPD (chronic obstructive pulmonary disease) ([place]), Diverticulosis of colon, Elevated prostate specific antigen (PSA), ETOH abuse, GERD (gastroesophageal reflux disease), colonic polyps, Hyperlipidemia, Hypertension, Pulmonary nodule, Shortness of breath, and Stroke ([place]).   Surgical History    Past Surgical History:  Procedure Laterality Date  . COLONOSCOPY    . needle biopsy RUL nodule  01/2009   benign  . TONSILLECTOMY  [place] History   Social History   Socioeconomic History  . Marital status: Widowed    Spouse name: Mary(deceased 01/02/99)  . Number of children: 1  . Years of education: Not on file  . Highest education level: Not on file  Occupational History  . 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 Financial resource strain: Not on file  . Food insecurity:    Worry: Not on file    Inability: Not on file  . Transportation needs:    Medical: Not on file    Non-medical: Not on file  Tobacco Use  . Smoking status: Current Every Day Smoker    Packs/day: 1.00    Years: 55.00    Pack years: 55.00    Types: Cigarettes  . Smokeless tobacco: Never Used  Substance and Sexual Activity  . Alcohol use: Yes    Alcohol/week: 24.0 standard drinks    Types: 24 Cans of beer per week    Comment: 12-24 beer daily  . Drug use: No  . Sexual activity: Never  Lifestyle  . Physical activity:    Days per week: Not on file    Minutes per session: Not on file  . Stress: Not on file  Relationships  . Social connections:    Talks on phone: Not on file    Gets together: Not on file    Attends religious service: Not on file    Active member of club or organization: Not on file    Attends meetings of clubs or organizations: Not on file    Relationship status: Not on file  . Intimate partner  violence:    Fear of current or ex partner: Not on file    Emotionally abused: Not on file    Physically abused: Not on file    Forced sexual activity: Not on file  Other Topics Concern  . Not on file  Social History Narrative   1 son that died in a MVA  ,  reports that he has been smoking cigarettes. He has a 55.00 pack-year smoking history. He has never used smokeless tobacco. He reports that he drinks about 24.0 standard drinks of alcohol per week. He reports that he does not use drugs.   Family history   His family history includes Diabetes in his father; Lung cancer in his brother. There is no history of Colon cancer, Esophageal cancer, or Stomach cancer.   Allergies No Known Allergies  Home meds  Prior to Admission medications   Medication Sig Start Date End Date Taking? Authorizing Provider  aspirin 81 MG chewable tablet Chew 81 mg by mouth daily.   Yes [provider]  bisacodyl (DULCOLAX) 10 MG suppository Place 10 mg rectally once as needed (for constipation not relieved by Milk of Magnesia).   Yes [provider]  cefTRIAXone (ROCEPHIN) 1 g injection Inject 1 g into the muscle See admin instructions. Inject 1 gram IM once a day for 7 days 07/06/18 07/13/18 Yes [provider]  Cholecalciferol (VITAMIN D-3) 1000 units CAPS Take 1,000 Units by mouth daily.   Yes [provider]  folic acid (FOLVITE) 1 MG tablet Take 1 tablet (1 mg total) by mouth daily. 08/27/16  Yes Nche, Charlene Brooke, NP  gemfibrozil (LOPID) 600 MG tablet Take 1 tablet (600 mg total) by mouth 2 (two) times daily. 08/27/16  Yes Nche, Charlene Brooke, NP  guaiFENesin (ROBITUSSIN) 100 MG/5ML liquid Take 200 mg by mouth every 4 (four) hours as needed for cough. x48 hours   Yes [provider]  ipratropium-albuterol (DUONEB) 0.5-2.5 (3) MG/3ML SOLN Take 3 mLs by nebulization See admin instructions. Nebulize and inhale 3 ml's into the lungs at 6 AM, 2 PM, and 10 PM for 5 days  with a start date of 07/06/18 AND 3 ml's every six hours as needed for coughing   Yes [provider]  levETIRAcetam (KEPPRA) 500 MG tablet Take 1 tablet (500 mg total) by mouth 2 (two) times daily. Patient taking differently: Take 500 mg by mouth every 12 (twelve) hours.  06/29/18  Yes Thurnell Lose, MD  magnesium hydroxide (MILK OF MAGNESIA) 400 MG/5ML suspension Take 30 mLs by mouth once as needed for mild constipation.   Yes [provider]  nicotine (NICODERM CQ - DOSED IN MG/24 HOURS) 21 mg/24hr patch Place 21 mg onto the skin daily.   Yes [provider]  Nutritional Supplements (NUTRITIONAL SUPPLEMENT PLUS) LIQD Take 120 mLs by mouth See admin instructions. MedPass: Drink 120 ml's by mouth two times a day   Yes [provider]  nystatin (MYCOSTATIN) 100000 UNIT/ML suspension Use as directed 5 mLs in the mouth or throat See admin instructions. Swish and spit 5 ml's four times a day for 14 days- Start: 07/04/18 & Stop: 07/14/18 07/04/18 07/14/18 Yes [provider]  Sodium Phosphates (RA SALINE ENEMA) 19-7 GM/118ML ENEM Place 1 enema rectally once as needed (for constipation not relieved by Dulcolax suppository and call MD if no relief from enema).   Yes [provider]  thiamine 100 MG tablet Take 1 tablet (100 mg total) by mouth daily. 08/27/16  Yes Nche, Charlene Brooke, NP  aspirin EC 81 MG tablet Take 1 tablet (81 mg total) by mouth daily. Patient not taking: Reported on 07/06/2018 08/27/16   Nche, Charlene Brooke, NP  chlordiazePOXIDE (LIBRIUM) 5 MG capsule Please dispense 18 pills - Take 1 pill three times a day for 3 days, then Take 1 pill two times a day for 3 days, then Take 1 pill once a day for 3 days and stop. Patient taking differently: Take 5 mg by mouth See admin instructions. Take 5 mg by mouth once a day for 3 days, then stop/Start: 07/09/18 & Stop: 07/12/18 06/29/18   Thurnell Lose, MD  cholecalciferol (VITAMIN D) 1000 units  tablet Take 1 tablet (1,000 Units total) by mouth daily. Patient not taking: Reported on 07/06/2018 08/27/16   Nche, Charlene Brooke, NP  saccharomyces boulardii (FLORASTOR) 250 MG capsule Take 250 mg by mouth See admin instructions. Take 250 mg by mouth 2 times a day for 10 days 07/07/18 07/16/18  [provider]    CC time: 30 min.   [name], [place] [facility] Pager: [phone]  or [phone] 07/07/2018, 2:51 AM

## 2018-07-07 NOTE — H&P (Addendum)
Pt is from [facility].  Pt now intubated - prognosis unclear and therefore pt is not appropriate for LTACH consideration at this time (CM received consult).  CSW/CM will continue to follow for appropriate discharge disposition

## 2018-07-07 NOTE — Progress Notes (Signed)
NAME:  Blake [name], MRN:  [id number], DOB:  06-05-1941, LOS: 1 ADMISSION DATE:  07/06/2018, CONSULTATION DATE:  07/07/18 REFERRING MD:  Marlowe Sax CHIEF COMPLAINT:  AMS   Brief History   Blake [name] is a 77 y.o. male from [facility] who was admitted with HCAP and later developed worsening hypoxia with hypercapnia.  Deemed to not be a candidate for BiPAP due to secretions; therefore, transferred to the ICU for intubation.  Significant Hospital Events   10/10 > admit. 10/11 > transfer to ICU for intubation.  Consults: date of consult/date signed off & final recs:  PCCM.  Procedures (surgical and bedside):  ETT 10/11 >   Significant Diagnostic Tests:  CXR 10/10 > LLL infiltrate.  Micro Data: Blood 10/10 >  Sputum 10/10 >  Urine 10/10 >   Antimicrobials:  Zosyn 10/10 >   Subjective:  Now intubated and sedated, FiO2 0.60, PEEP 5 On phenylephrine 90, Poor urine output reported  Objective   Blood pressure (!) 105/52, pulse (!) 50, temperature 97.8 F (36.6 C), temperature source Oral, resp. rate 20, height 5\' 5"  (1.651 m), weight 67.8 kg, SpO2 100 %.    Vent Mode: PRVC FiO2 (%):  [50 %-100 %] 50 % Set Rate:  [20 bmp] 20 bmp Vt Set:  [490 mL] 490 mL PEEP:  [5 cmH20] 5 cmH20 Plateau Pressure:  [14 cmH20-16 cmH20] 14 cmH20   Intake/Output Summary (Last 24 hours) at 07/07/2018 1231 Last data filed at 07/07/2018 1100 Gross per 24 hour  Intake 5364.02 ml  Output 615 ml  Net 4749.02 ml   Filed Weights   07/06/18 2234  Weight: 67.8 kg    Examination: General: Ill appearing elderly man on mechanical ventilation Neuro: Grimace with pain, no response to voice, does not follow commands on sedation HEENT: ET tube in good position, no oral lesions or secretions Cardiovascular: Regular, no murmur Lungs: Coarse bilateral breath sounds, more so on the left, no wheezing Abdomen: Soft, nondistended with positive bowel sounds Musculoskeletal: No deformities, no  edema Skin: Warm, no rash  Assessment & Plan:   Acute hypercapnic and hypoxemic respiratory failure - not a candidate for BiPAP due to secretions. -Mechanically ventilated, continue PRVC cc per kilogram -Wean FiO2 as able, PEEP at 5 -Follow chest x-ray, ABG intermittently  Left lower lobe HCAP. -Continue Zosyn pending culture data, tailor as possible -Hold off vancomycin, add if cultures dictate or if he decompensates -Follow chest x-ray  Shock, presumed multifactorial including septic and hypovolemic, possible component of sedating medication -Wean phenylephrine as able -Consider CVP if shock persists -IV fluid bolus now, continue volume resuscitation  Hx COPD, tobacco dependence. -Bronchodilators, nicotine patch  Hx Stroke. -Continue pre-admission aspirin  Hx Seizures. -Continue his home Keppra  Hx HTN, HLD. -Remains on gemfibrozil  Hx EtOH abuse. -Thiamine and folic acid added -Watch for evidence of withdrawal symptoms  Disposition / Summary of Today's Plan 07/07/18   ICU. Continue vent support and f/u on repeat ABG. Continue zosyn.    Diet: NPO. Pain/Anxiety/Delirium protocol (if indicated): Fentanyl gtt / Midazolam PRN. VAP protocol (if indicated): In place. DVT prophylaxis: SCD's / Lovenox. GI prophylaxis: famotidine. Hyperglycemia protocol: None. Mobility: Bedrest. Code Status: Full. Family Communication: None available.  Labs   CBC: Recent Labs  Lab 07/06/18 1932 07/07/18 0401  WBC 33.3* 23.3*  NEUTROABS 30.3*  --   HGB 15.2 11.4*  HCT 47.2 36.6*  MCV 105.8* 108.6*  PLT 369 878   Basic Metabolic Panel:  Recent Labs  Lab 07/06/18 1932 07/07/18 0401  NA 136 142  K 4.3 3.7  CL 99 112*  CO2 25 22  GLUCOSE 143* 86  BUN 15 15  CREATININE 0.86 0.72  CALCIUM 8.9 6.3*   GFR: Estimated Creatinine Clearance: 67.3 mL/min (by C-G formula based on SCr of 0.72 mg/dL). Recent Labs  Lab 07/06/18 1932 07/06/18 2047 07/07/18 0116 07/07/18 0401   WBC 33.3*  --   --  23.3*  LATICACIDVEN  --  2.12* 0.9 0.8   Liver Function Tests: No results for input(s): AST, ALT, ALKPHOS, BILITOT, PROT, ALBUMIN in the last 168 hours. No results for input(s): LIPASE, AMYLASE in the last 168 hours. No results for input(s): AMMONIA in the last 168 hours. ABG    Component Value Date/Time   PHART 7.249 (L) 07/07/2018 0400   PCO2ART 56.1 (H) 07/07/2018 0400   PO2ART 185 (H) 07/07/2018 0400   HCO3 23.7 07/07/2018 0400   TCO2 25 01/07/2018 1344   ACIDBASEDEF 2.5 (H) 07/07/2018 0400   O2SAT 98.9 07/07/2018 0400    Coagulation Profile: No results for input(s): INR, PROTIME in the last 168 hours. Cardiac Enzymes: No results for input(s): CKTOTAL, CKMB, CKMBINDEX, TROPONINI in the last 168 hours. HbA1C: Hgb A1c MFr Bld  Date/Time Value Ref Range Status  08/27/2016 10:20 AM 4.5 (L) 4.6 - 6.5 % Final    Comment:    Glycemic Control Guidelines for People with Diabetes:Non Diabetic:  <6%Goal of Therapy: <7%Additional Action Suggested:  >8%   10/15/2014 03:17 AM 4.7 <5.7 % Final    Comment:    (NOTE)                                                                       According to the ADA Clinical Practice Recommendations for 2011, when HbA1c is used as a screening test:  >=6.5%   Diagnostic of Diabetes Mellitus           (if abnormal result is confirmed) 5.7-6.4%   Increased risk of developing Diabetes Mellitus References:Diagnosis and Classification of Diabetes Mellitus,Diabetes UKGU,5427,06(CBJSE 1):S62-S69 and Standards of Medical Care in         Diabetes - 2011,Diabetes GBTD,1761,60 (Suppl 1):S11-S61.    CBG: Recent Labs  Lab 07/07/18 0254  GLUCAP 101*    Admitting History of Present Illness.   Pt is encephelopathic; therefore, this HPI is obtained from chart review. Blake [name] is a 77 y.o. male who has a PMH as outlined below and who resides at [facility].  He presented to [facility] ED on 10/10 with worsening cough, SOB, AMS.     He was seen at [facility] earlier that day and was found to have hypoxia with sats in 80s.  CXR showed LLL PNA.  Later in the day, he began to have AMS then was sent to ED.  In ED, he had copious secretions.  He required NRB to maintain SpO2 > 90%.  He was admitted by [facility] to SDU. Early AM 10/11, he became less responsive.  ABG demonstrated acute hypercapnic and hypoxemic respiratory failure (7.12 / 87 / 76).  Due to his secretions, he was deemed to not be a candidate for BiPAP. PCCM was subsequently consulted  for concerns pt would require intubation.  He had recent admission 06/25/18 through 06/29/18 for seizures and EtOH withdrawal and was discharged to [facility] for continued rehab efforts.  Independent CC time 32 minutes  Baltazar Apo, MD, PhD 07/07/2018, 12:38 PM [facility] [phone] or if no answer [phone]

## 2018-07-07 NOTE — Progress Notes (Signed)
Pharmacy Antibiotic Note  Blake [name] is a 77 y.o. male admitted on 07/06/2018 with aspiration PNA.  Pharmacy has been consulted for Zosyn dosing. Pt from [place]. WBC is elevated. Renal function OK.   Plan: Zosyn 3.375G IV q8h to be infused over 4 hours Low threshold to add MRSA coverage  Trend WBC, temp, renal function  Height: 5\' 11"  (180.3 cm) Weight: 149 lb 7.6 oz (67.8 kg) IBW/kg (Calculated) : 75.3  Temp (24hrs), Avg:97.6 F (36.4 C), Min:96.6 F (35.9 C), Max:98.8 F (37.1 C)  Recent Labs  Lab 07/06/18 1932 07/06/18 2047  WBC 33.3*  --   CREATININE 0.86  --   LATICACIDVEN  --  2.12*    Estimated Creatinine Clearance: 69 mL/min (by C-G formula based on SCr of 0.86 mg/dL).    No Known Allergies   Narda Bonds 07/07/2018 12:46 AM

## 2018-07-08 ENCOUNTER — Inpatient Hospital Stay (HOSPITAL_COMMUNITY): Payer: Medicare Other

## 2018-07-08 DIAGNOSIS — J690 Pneumonitis due to inhalation of food and vomit: Secondary | ICD-10-CM

## 2018-07-08 LAB — GLUCOSE, CAPILLARY
GLUCOSE-CAPILLARY: 108 mg/dL — AB (ref 70–99)
GLUCOSE-CAPILLARY: 76 mg/dL (ref 70–99)
Glucose-Capillary: 120 mg/dL — ABNORMAL HIGH (ref 70–99)
Glucose-Capillary: 75 mg/dL (ref 70–99)
Glucose-Capillary: 84 mg/dL (ref 70–99)
Glucose-Capillary: 91 mg/dL (ref 70–99)
Glucose-Capillary: 96 mg/dL (ref 70–99)

## 2018-07-08 LAB — POCT I-STAT 3, ART BLOOD GAS (G3+)
Acid-base deficit: 6 mmol/L — ABNORMAL HIGH (ref 0.0–2.0)
Bicarbonate: 19.7 mmol/L — ABNORMAL LOW (ref 20.0–28.0)
O2 SAT: 96 %
TCO2: 21 mmol/L — AB (ref 22–32)
pCO2 arterial: 38.9 mmHg (ref 32.0–48.0)
pH, Arterial: 7.315 — ABNORMAL LOW (ref 7.350–7.450)
pO2, Arterial: 87 mmHg (ref 83.0–108.0)

## 2018-07-08 LAB — BASIC METABOLIC PANEL
ANION GAP: 7 (ref 5–15)
BUN: 16 mg/dL (ref 8–23)
CHLORIDE: 115 mmol/L — AB (ref 98–111)
CO2: 21 mmol/L — AB (ref 22–32)
CREATININE: 0.82 mg/dL (ref 0.61–1.24)
Calcium: 7.7 mg/dL — ABNORMAL LOW (ref 8.9–10.3)
GFR calc Af Amer: >60 mL/min (ref >60)
GFR calc non Af Amer: >60 mL/min (ref >60)
Glucose, Bld: 99 mg/dL (ref 70–99)
Potassium: 4 mmol/L (ref 3.5–5.1)
Sodium: 143 mmol/L (ref 135–145)

## 2018-07-08 LAB — CBC
HEMATOCRIT: 36 % — AB (ref 39.0–52.0)
HEMOGLOBIN: 11.6 g/dL — AB (ref 13.0–17.0)
MCH: 34.5 pg — AB (ref 26.0–34.0)
MCHC: 32.2 g/dL (ref 30.0–36.0)
MCV: 107.1 fL — AB (ref 80.0–100.0)
Platelets: 353 10*3/uL (ref 150–400)
RBC: 3.36 MIL/uL — AB (ref 4.22–5.81)
RDW: 14.5 % (ref 11.5–15.5)
WBC: 24 10*3/uL — ABNORMAL HIGH (ref 4.0–10.5)
nRBC: 0 % (ref 0.0–0.2)

## 2018-07-08 LAB — MAGNESIUM: Magnesium: 2 mg/dL (ref 1.7–2.4)

## 2018-07-08 LAB — PHOSPHORUS: PHOSPHORUS: 2.5 mg/dL (ref 2.5–4.6)

## 2018-07-08 MED ORDER — VITAL HIGH PROTEIN PO LIQD: 1000.0000 mL | ORAL | Status: DC

## 2018-07-08 MED ORDER — VITAL AF 1.2 CAL PO LIQD
1000.0000 mL | ORAL | Status: DC
  Administered 2018-07-08 – 2018-07-15 (×6): 1000 mL

## 2018-07-08 NOTE — Progress Notes (Signed)
NAME:  Blake [name], MRN:  [id number], DOB:  1941/05/16, LOS: 2 ADMISSION DATE:  07/06/2018, CONSULTATION DATE:  07/07/18 REFERRING MD:  Marlowe Sax CHIEF COMPLAINT:  AMS   Brief History   Blake [name] is a 77 y.o. male from [facility] who was admitted with HCAP and later developed worsening hypoxia with hypercapnia.  Deemed to not be a candidate for BiPAP due to secretions; therefore, transferred to the ICU for intubation.  Significant Hospital Events   10/10 > admit. 10/11 > transfer to ICU for intubation.  Consults: date of consult/date signed off & final recs:  PCCM.  Procedures (surgical and bedside):  ETT 10/11 >   Significant Diagnostic Tests:  CXR 10/10 > LLL infiltrate.  Micro Data: Blood 10/10 >  Sputum 10/10 >  Urine 10/10 >   Antimicrobials:  Zosyn 10/10 >   Subjective:  Failed wean this am.  Weaned off pressors this am .  UOP remains low , + 9 L bal since admit   Objective   Blood pressure (!) 141/66, pulse (!) 101, temperature 99.2 F (37.3 C), temperature source Axillary, resp. rate (!) 30, height _0  (1.651 m), weight 67.8 kg, SpO2 92 %.    Vent Mode: PRVC FiO2 (%):  [40 %-50 %] 40 % Set Rate:  [20 bmp] 20 bmp Vt Set:  [490 mL] 490 mL PEEP:  [5 cmH20] 5 cmH20 Pressure Support:  [12 cmH20] 12 cmH20 Plateau Pressure:  [14 cmH20-27 cmH20] 27 cmH20   Intake/Output Summary (Last 24 hours) at 07/08/2018 1057 Last data filed at 07/08/2018 1000 Gross per 24 hour  Intake 5415.16 ml  Output 760 ml  Net 4655.16 ml   Filed Weights   07/06/18 2234  Weight: 67.8 kg    Examination: General: Ill-appearing elderly male on vent  Neuro: Withdraws to pain follows intermittent commands on sedation  HEENT: ETT in place  Cardiovascular: RRR, no MRG  Lungs: Coarse rhonchi bilaterally  Abdomen: Soft, nondistended positive bowel sounds  Musculoskeletal: No deformities, trace edema  Skin: Warm and intact   Assessment & Plan:   Acute hypercapnic  and hypoxemic respiratory failure - not a candidate for BiPAP due to secretions. -Mechanically ventilated, continue PRVC cc per kilogram -Wean FiO2 as able, PEEP at 5 -Follow chest x-ray, ABG intermittently  Left lower lobe HCAP. -Continue Zosyn pending culture data, tailor as possible -Hold off vancomycin, add if cultures dictate or if he decompensates -Follow chest x-ray  Shock, presumed multifactorial including septic and hypovolemic, possible component of sedating medication -Wean phenylephrine -off 10/12  -continue on IVF   Hx COPD, tobacco dependence. -Bronchodilators, nicotine patch  Hx Stroke. -Continue pre-admission aspirin  Hx Seizures. -Continue his home Keppra  Hx HTN, HLD. -Remains on gemfibrozil  Hx EtOH abuse. -Thiamine and folic acid added -Watch for evidence of withdrawal symptoms  Disposition / Summary of Today's Plan 07/08/18   ICU. Continue vent support  Continue zosyn. Add TF     Diet: TF  Pain/Anxiety/Delirium protocol (if indicated): Fentanyl gtt / Midazolam PRN. VAP protocol (if indicated): In place. DVT prophylaxis: SCD's / Lovenox. GI prophylaxis: famotidine. Hyperglycemia protocol: None. Mobility: Bedrest. Code Status: Full. Family Communication: None available.  Labs   CBC: Recent Labs  Lab 07/06/18 1932 07/07/18 0401 07/08/18 0409  WBC 33.3* 23.3* 24.0*  NEUTROABS 30.3*  --   --   HGB 15.2 11.4* 11.6*  HCT 47.2 36.6* 36.0*  MCV 105.8* 108.6* 107.1*  PLT 369 272 353  Basic Metabolic Panel: Recent Labs  Lab 07/06/18 1932 07/07/18 0401 07/08/18 0409  NA 136 142 143  K 4.3 3.7 4.0  CL 99 112* 115*  CO2 25 22 21*  GLUCOSE 143* 86 99  BUN _0 CREATININE 0.86 0.72 0.82  CALCIUM 8.9 6.3* 7.7*  MG  --   --  2.0  PHOS  --   --  2.5   GFR: Estimated Creatinine Clearance: 65.6 mL/min (by C-G formula based on SCr of 0.82 mg/dL). Recent Labs  Lab 07/06/18 1932 07/06/18 2047 07/07/18 0116 07/07/18 0401  07/08/18 0409  WBC 33.3*  --   --  23.3* 24.0*  LATICACIDVEN  --  2.12* 0.9 0.8  --    Liver Function Tests: No results for input(s): AST, ALT, ALKPHOS, BILITOT, PROT, ALBUMIN in the last 168 hours. No results for input(s): LIPASE, AMYLASE in the last 168 hours. No results for input(s): AMMONIA in the last 168 hours. ABG    Component Value Date/Time   PHART 7.315 (L) 07/08/2018 0445   PCO2ART 38.9 07/08/2018 0445   PO2ART 87.0 07/08/2018 0445   HCO3 19.7 (L) 07/08/2018 0445   TCO2 21 (L) 07/08/2018 0445   ACIDBASEDEF 6.0 (H) 07/08/2018 0445   O2SAT 96.0 07/08/2018 0445    Coagulation Profile: No results for input(s): INR, PROTIME in the last 168 hours. Cardiac Enzymes: No results for input(s): CKTOTAL, CKMB, CKMBINDEX, TROPONINI in the last 168 hours. HbA1C: Hgb A1c MFr Bld  Date/Time Value Ref Range Status  08/27/2016 10:20 AM 4.5 (L) 4.6 - 6.5 % Final    Comment:    Glycemic Control Guidelines for People with Diabetes:Non Diabetic:  <6%Goal of Therapy: <7%Additional Action Suggested:  >8%   10/15/2014 03:17 AM 4.7 <5.7 % Final    Comment:    (NOTE)                                                                       According to the ADA Clinical Practice Recommendations for 2011, when HbA1c is used as a screening test:  >=6.5%   Diagnostic of Diabetes Mellitus           (if abnormal result is confirmed) 5.7-6.4%   Increased risk of developing Diabetes Mellitus References:Diagnosis and Classification of Diabetes Mellitus,Diabetes IOEV,0350,09(FGHWE 1):S62-S69 and Standards of Medical Care in         Diabetes - 2011,Diabetes XHBZ,1696,78 (Suppl 1):S11-S61.    CBG: Recent Labs  Lab 07/07/18 1920 07/07/18 2332 07/08/18 0042 07/08/18 0337 07/08/18 0715  GLUCAP 137* 60* 120* 76 84    Admitting History of Present Illness.   Pt is encephelopathic; therefore, this HPI is obtained from chart review. Blake [name] is a 77 y.o. male who has a PMH as outlined below and  who resides at [facility].  He presented to [facility] ED on 10/10 with worsening cough, SOB, AMS.    He was seen at [facility] earlier that day and was found to have hypoxia with sats in 80s.  CXR showed LLL PNA.  Later in the day, he began to have AMS then was sent to ED.  In ED, he had copious secretions.  He required NRB to maintain SpO2 >  90%.  He was admitted by [facility] to SDU. Early AM 10/11, he became less responsive.  ABG demonstrated acute hypercapnic and hypoxemic respiratory failure (7.12 / 87 / 76).  Due to his secretions, he was deemed to not be a candidate for BiPAP. PCCM was subsequently consulted for concerns pt would require intubation.  He had recent admission 06/25/18 through 06/29/18 for seizures and EtOH withdrawal and was discharged to [facility] for continued rehab efforts.  Tammy Parrett NP-C  [facility]  [phone]

## 2018-07-08 NOTE — Progress Notes (Signed)
Hypoglycemic Event  CBG: 60  Treatment: D50 IV 25 mL  Symptoms: None  Follow-up CBG: [id number]:[id number] CBG Result:120  Possible Reasons for Event: Unknown  Comments/MD notified:Elink    Kendalyn Cranfield J Jarrah Babich

## 2018-07-08 NOTE — Progress Notes (Signed)
Nutrition Follow-up  DOCUMENTATION CODES:   Non-severe (moderate) malnutrition in context of chronic illness  INTERVENTION:  - Will order TF: Vital 1.2 @ 20 mL/hr to advance by 10 mL every 4 hours to reach goal rate of 60 mL/hr. - At goal rate, this regimen will provide 1728 kcal, 108 grams of protein, and 1168 mL free water.    NUTRITION DIAGNOSIS:   Moderate Malnutrition related to chronic illness(COPD) as evidenced by moderate fat depletion, severe fat depletion, mild muscle depletion, moderate muscle depletion. -ongoing  GOAL:   Patient will meet greater than or equal to 90% of their needs -unmet at this time  MONITOR:   Vent status, TF tolerance, Weight trends, Labs  REASON FOR ASSESSMENT:   Consult Enteral/tube feeding initiation and management  ASSESSMENT:   77 yo male with PMH of COPD, pulmonary nodule, smoker, seizures, HTN, HLD, GERD, alcohol abuse, stroke who was admitted on 10/10 with HCAP. Respiratory status declined and required transfer to the ICU for intubation on 10/11.  No new weight since admission. Patient remains intubated with OGT in place. TF order as outlined above; slow advancement given malnutrition and inability to obtain information about nutrition/PO intakes PTA. No family/visitors present at this time.    Patient is currently intubated on ventilator support MV: 11.3 L/min Temp (24hrs), Avg:99.4 F (37.4 C), Min:98.7 F (37.1 C), Max:99.8 F (37.7 C) Propofol: none BP: 103/59 and MAP: 73  Medications reviewed; 1000 units vitamin D/day, 20 mg IV Pepcid BID, 1 mg folic acid/day, 151 mg IV thiamine/day.  Labs reviewed; CBGs: 120, 76, 84, and 96 mg/dL today, Cl: 115 mmol/L, Ca: 7.7 mg/dL. K, Mg, and Phos are all WDL.  IVF; NS @ 100 mL/hr. Drips; Neo @ 10 mcg/min, Fentanyl @ 75 mcg/hr.   Diet Order:   Diet Order            Diet NPO time specified  Diet effective now              EDUCATION NEEDS:   No education needs have been  identified at this time  Skin:  Skin Assessment: Reviewed RN Assessment  Last BM:  PTA/unknown  Height:   Ht Readings from Last 1 Encounters:  07/07/18 5\' 5"  (1.651 m)    Weight:   Wt Readings from Last 1 Encounters:  07/06/18 67.8 kg    Ideal Body Weight:  78.2 kg  BMI:  Body mass index is 24.87 kg/m.  Estimated Nutritional Needs:   Kcal:  7616  Protein:  100-115 gm  Fluid:  1.8-2 L     Jarome Matin, MS, RD, LDN, [facility] Inpatient Clinical Dietitian Pager # [phone] After hours/weekend pager # [phone]

## 2018-07-09 ENCOUNTER — Inpatient Hospital Stay: Payer: Self-pay

## 2018-07-09 ENCOUNTER — Inpatient Hospital Stay (HOSPITAL_COMMUNITY): Payer: Medicare Other

## 2018-07-09 LAB — GLUCOSE, CAPILLARY
GLUCOSE-CAPILLARY: 111 mg/dL — AB (ref 70–99)
GLUCOSE-CAPILLARY: 143 mg/dL — AB (ref 70–99)
GLUCOSE-CAPILLARY: 162 mg/dL — AB (ref 70–99)
GLUCOSE-CAPILLARY: 176 mg/dL — AB (ref 70–99)
GLUCOSE-CAPILLARY: 181 mg/dL — AB (ref 70–99)
Glucose-Capillary: 198 mg/dL — ABNORMAL HIGH (ref 70–99)

## 2018-07-09 LAB — BASIC METABOLIC PANEL
ANION GAP: 6 (ref 5–15)
BUN: 14 mg/dL (ref 8–23)
CALCIUM: 7.5 mg/dL — AB (ref 8.9–10.3)
CO2: 20 mmol/L — AB (ref 22–32)
CREATININE: 0.81 mg/dL (ref 0.61–1.24)
Chloride: 116 mmol/L — ABNORMAL HIGH (ref 98–111)
Glucose, Bld: 165 mg/dL — ABNORMAL HIGH (ref 70–99)
Potassium: 3.6 mmol/L (ref 3.5–5.1)
SODIUM: 142 mmol/L (ref 135–145)

## 2018-07-09 LAB — CBC
HEMATOCRIT: 36.1 % — AB (ref 39.0–52.0)
Hemoglobin: 11.4 g/dL — ABNORMAL LOW (ref 13.0–17.0)
MCH: 34 pg (ref 26.0–34.0)
MCHC: 31.6 g/dL (ref 30.0–36.0)
MCV: 107.8 fL — ABNORMAL HIGH (ref 80.0–100.0)
NRBC: 0 % (ref 0.0–0.2)
PLATELETS: 348 10*3/uL (ref 150–400)
RBC: 3.35 MIL/uL — ABNORMAL LOW (ref 4.22–5.81)
RDW: 14.6 % (ref 11.5–15.5)
WBC: 18 10*3/uL — AB (ref 4.0–10.5)

## 2018-07-09 MED ORDER — SODIUM CHLORIDE 0.9% FLUSH
10.0000 mL | INTRAVENOUS | Status: DC | PRN
  Administered 2018-07-19: 10 mL
  Administered 2018-07-21 (×2): 20 mL
  Filled 2018-07-09 (×3): qty 40

## 2018-07-09 MED ORDER — SODIUM CHLORIDE 0.9% FLUSH
10.0000 mL | Freq: Two times a day (BID) | INTRAVENOUS | Status: DC
  Administered 2018-07-10 – 2018-07-12 (×4): 10 mL
  Administered 2018-07-12: 20 mL
  Administered 2018-07-13 – 2018-07-20 (×14): 10 mL
  Administered 2018-07-21: 20 mL
  Administered 2018-07-22 – 2018-07-23 (×2): 10 mL

## 2018-07-09 MED ORDER — CHLORHEXIDINE GLUCONATE CLOTH 2 % EX PADS
6.0000 | MEDICATED_PAD | Freq: Every day | CUTANEOUS | Status: DC
  Administered 2018-07-09 – 2018-07-24 (×15): 6 via TOPICAL

## 2018-07-09 MED ORDER — FUROSEMIDE 10 MG/ML IJ SOLN
20.0000 mg | Freq: Once | INTRAMUSCULAR | Status: AC
  Administered 2018-07-09: 20 mg via INTRAVENOUS
  Filled 2018-07-09: qty 2

## 2018-07-09 NOTE — Progress Notes (Signed)
Dear Doctor: Blake [name] This patient has been identified as a candidate for PICC for the following reason (s): IV therapy over 48 hours, poor veins/poor circulatory system (CHF, COPD, emphysema, diabetes, steroid use, IV drug abuse, etc.) and restarts due to phlebitis and infiltration in 24 hours If you agree, please write an order for the indicated device. For any questions contact the Vascular Access Team at [phone] if no answer, please leave a message.  Thank you for supporting the early vascular access assessment program.

## 2018-07-09 NOTE — Progress Notes (Signed)
Peripherally Inserted Central Catheter/Midline Placement  The IV Nurse has discussed with the patient and/or persons authorized to consent for the patient, the purpose of this procedure and the potential benefits and risks involved with this procedure.  The benefits include less needle sticks, lab draws from the catheter, and the patient may be discharged home with the catheter. Risks include, but not limited to, infection, bleeding, blood clot (thrombus formation), and puncture of an artery; nerve damage and irregular heartbeat and possibility to perform a PICC exchange if needed/ordered by physician.  Alternatives to this procedure were also discussed.  Bard Power PICC patient education guide, fact sheet on infection prevention and patient information card has been provided to patient /or left at bedside.  telephone consent obtained from niece.  PICC/Midline Placement Documentation  PICC Double Lumen 56/15/37 PICC Right Basilic 42 cm 0 cm (Active)  Indication for Insertion or Continuance of Line Vasoactive infusions;Poor Vasculature-patient has had multiple peripheral attempts or PIVs lasting less than 24 hours 07/09/2018  4:00 PM  Exposed Catheter (cm) 0 cm 07/09/2018  4:00 PM  Site Assessment Clean;Dry;Intact 07/09/2018  4:00 PM  Lumen #1 Status Flushed;Saline locked;Blood return noted 07/09/2018  4:00 PM  Lumen #2 Status Flushed;Saline locked;Blood return noted 07/09/2018  4:00 PM  Dressing Type Transparent 07/09/2018  4:00 PM  Dressing Status Clean;Dry;Intact;Antimicrobial disc in place 07/09/2018  4:00 PM  Line Care Connections checked and tightened 07/09/2018  4:00 PM  Line Adjustment (NICU/IV Team Only) Yes 07/09/2018  4:00 PM  Dressing Intervention New dressing 07/09/2018  4:00 PM  Dressing Change Due 07/16/18 07/09/2018  4:00 PM       Rolena Infante 07/09/2018, 4:38 PM

## 2018-07-09 NOTE — Progress Notes (Signed)
NAME:  YUTA CIPOLLONE, MRN:  [id number], DOB:  November 14, 1940, LOS: 3 ADMISSION DATE:  07/06/2018, CONSULTATION DATE:  07/07/18 REFERRING MD:  Marlowe Sax CHIEF COMPLAINT:  AMS   Brief History   CAM DAUPHIN is a 77 y.o. male from [facility] who was admitted with HCAP and later developed worsening hypoxia with hypercapnia.  Deemed to not be a candidate for BiPAP due to secretions; therefore, transferred to the ICU for intubation.  Significant Hospital Events   10/10 > admit. 10/11 > transfer to ICU for intubation.  Consults: date of consult/date signed off & final recs:  PCCM.  Procedures (surgical and bedside):  ETT 10/11 >   Significant Diagnostic Tests:  CXR 10/10 > LLL infiltrate.  Micro Data: Blood 10/10 >  Sputum 10/10 >  Urine 10/10 >   Antimicrobials:  Zosyn 10/10 >   Subjective:  Weaning this am . Remains off pressors  UOP remains low , +11L I/O    Objective   Blood pressure (!) 110/58, pulse 72, temperature 99.5 F (37.5 C), temperature source Axillary, resp. rate (!) 23, height 5\' 5"  (1.651 m), weight 75.5 kg, SpO2 97 %.    Vent Mode: PSV;CPAP FiO2 (%):  [40 %] 40 % Set Rate:  [20 bmp] 20 bmp Vt Set:  [490 mL] 490 mL PEEP:  [5 cmH20] 5 cmH20 Pressure Support:  [10 cmH20] 10 cmH20 Plateau Pressure:  [15 cmH20-21 cmH20] 15 cmH20   Intake/Output Summary (Last 24 hours) at 07/09/2018 0844 Last data filed at 07/09/2018 0800 Gross per 24 hour  Intake 3766.17 ml  Output 850 ml  Net 2916.17 ml   Filed Weights   07/06/18 2234 07/09/18 0342  Weight: 67.8 kg 75.5 kg    Examination: General: Ill-appearing elderly male on vent  Neuro: Asleep on sedation follows commands intermittently  HEENT: ETT in place  Cardiovascular: RRR, no MRG Lungs: Coarse rhonchi bilaterally  Abdomen: Soft positive bowel sounds tube feeds  Musculoskeletal:  no deformities, intact  Skin: Intact warm   Assessment & Plan:   Acute hypercapnic and hypoxemic respiratory  failure - not a candidate for BiPAP due to secretions. -Mechanically ventilated,   PLAN  Continue Vent support  Wean as able  VAP  Follow chest x-ray, ABG intermittently  Left lower lobe HCAP. -Continue Zosyn pending culture data, tailor as possible -Hold off vancomycin, add if cultures dictate or if he decompensates -Follow chest x-ray  Shock, presumed multifactorial including septic and hypovolemic, possible component of sedating medication>resolved  -phenylephrine -off 10/12  -KVO IVF   Volume Overload -11L positive since admit   -Gentle diuresis -Lasix 20mg  IV x 1     Hx COPD, tobacco dependence. -Bronchodilators, nicotine patch  Hx Stroke. -Continue pre-admission aspirin  Hx Seizures. -Continue his home Keppra  Hx HTN, HLD. -Remains on gemfibrozil  Hx EtOH abuse. -Thiamine and folic acid added -Watch for evidence of withdrawal symptoms  Disposition / Summary of Today's Plan 07/09/18   ICU. Continue vent support  Continue zosyn. Add TF     Diet: TF  Pain/Anxiety/Delirium protocol (if indicated): Fentanyl gtt / Midazolam PRN. VAP protocol (if indicated): In place. DVT prophylaxis: SCD's / Lovenox. GI prophylaxis: famotidine. Hyperglycemia protocol: None. Mobility: Bedrest. Code Status: Full. Family Communication: None available.  Labs   CBC: Recent Labs  Lab 07/06/18 1932 07/07/18 0401 07/08/18 0409 07/09/18 0632  WBC 33.3* 23.3* 24.0* 18.0*  NEUTROABS 30.3*  --   --   --   HGB 15.2  11.4* 11.6* 11.4*  HCT 47.2 36.6* 36.0* 36.1*  MCV 105.8* 108.6* 107.1* 107.8*  PLT 369 272 353 761   Basic Metabolic Panel: Recent Labs  Lab 07/06/18 1932 07/07/18 0401 07/08/18 0409 07/09/18 0632  NA 136 142 143 142  K 4.3 3.7 4.0 3.6  CL 99 112* 115* 116*  CO2 25 22 21* 20*  GLUCOSE 143* 86 99 165*  BUN 15 15 16 14   CREATININE 0.86 0.72 0.82 0.81  CALCIUM 8.9 6.3* 7.7* 7.5*  MG  --   --  2.0  --   PHOS  --   --  2.5  --    GFR: Estimated  Creatinine Clearance: 72.5 mL/min (by C-G formula based on SCr of 0.81 mg/dL). Recent Labs  Lab 07/06/18 1932 07/06/18 2047 07/07/18 0116 07/07/18 0401 07/08/18 0409 07/09/18 0632  WBC 33.3*  --   --  23.3* 24.0* 18.0*  LATICACIDVEN  --  2.12* 0.9 0.8  --   --    Liver Function Tests: No results for input(s): AST, ALT, ALKPHOS, BILITOT, PROT, ALBUMIN in the last 168 hours. No results for input(s): LIPASE, AMYLASE in the last 168 hours. No results for input(s): AMMONIA in the last 168 hours. ABG    Component Value Date/Time   PHART 7.315 (L) 07/08/2018 0445   PCO2ART 38.9 07/08/2018 0445   PO2ART 87.0 07/08/2018 0445   HCO3 19.7 (L) 07/08/2018 0445   TCO2 21 (L) 07/08/2018 0445   ACIDBASEDEF 6.0 (H) 07/08/2018 0445   O2SAT 96.0 07/08/2018 0445    Coagulation Profile: No results for input(s): INR, PROTIME in the last 168 hours. Cardiac Enzymes: No results for input(s): CKTOTAL, CKMB, CKMBINDEX, TROPONINI in the last 168 hours. HbA1C: Hgb A1c MFr Bld  Date/Time Value Ref Range Status  08/27/2016 10:20 AM 4.5 (L) 4.6 - 6.5 % Final    Comment:    Glycemic Control Guidelines for People with Diabetes:Non Diabetic:  <6%Goal of Therapy: <7%Additional Action Suggested:  >8%   10/15/2014 03:17 AM 4.7 <5.7 % Final    Comment:    (NOTE)                                                                       According to the ADA Clinical Practice Recommendations for 2011, when HbA1c is used as a screening test:  >=6.5%   Diagnostic of Diabetes Mellitus           (if abnormal result is confirmed) 5.7-6.4%   Increased risk of developing Diabetes Mellitus References:Diagnosis and Classification of Diabetes Mellitus,Diabetes YWVP,7106,26(RSWNI 1):S62-S69 and Standards of Medical Care in         Diabetes - 2011,Diabetes OEVO,3500,93 (Suppl 1):S11-S61.    CBG: Recent Labs  Lab 07/08/18 1521 07/08/18 1917 07/08/18 2347 07/09/18 0338 07/09/18 0706  GLUCAP 75 91 108* 111* 143*     Admitting History of Present Illness.   Pt is encephelopathic; therefore, this HPI is obtained from chart review. KEGAN MCKEITHAN is a 77 y.o. male who has a PMH as outlined below and who resides at [facility].  He presented to [facility] ED on 10/10 with worsening cough, SOB, AMS.    He was seen at [facility] earlier that day  and was found to have hypoxia with sats in 80s.  CXR showed LLL PNA.  Later in the day, he began to have AMS then was sent to ED.  In ED, he had copious secretions.  He required NRB to maintain SpO2 > 90%.  He was admitted by [facility] to SDU. Early AM 10/11, he became less responsive.  ABG demonstrated acute hypercapnic and hypoxemic respiratory failure (7.12 / 87 / 76).  Due to his secretions, he was deemed to not be a candidate for BiPAP. PCCM was subsequently consulted for concerns pt would require intubation.  He had recent admission 06/25/18 through 06/29/18 for seizures and EtOH withdrawal and was discharged to [facility] for continued rehab efforts.  Kyl Givler NP-C  [facility]  [phone]

## 2018-07-10 ENCOUNTER — Inpatient Hospital Stay (HOSPITAL_COMMUNITY): Payer: Medicare Other

## 2018-07-10 DIAGNOSIS — E440 Moderate protein-calorie malnutrition: Secondary | ICD-10-CM | POA: Diagnosis present

## 2018-07-10 LAB — BASIC METABOLIC PANEL
Anion gap: 4 — ABNORMAL LOW (ref 5–15)
BUN: 15 mg/dL (ref 8–23)
CHLORIDE: 115 mmol/L — AB (ref 98–111)
CO2: 24 mmol/L (ref 22–32)
CREATININE: 0.72 mg/dL (ref 0.61–1.24)
Calcium: 7.7 mg/dL — ABNORMAL LOW (ref 8.9–10.3)
Glucose, Bld: 202 mg/dL — ABNORMAL HIGH (ref 70–99)
Potassium: 3.4 mmol/L — ABNORMAL LOW (ref 3.5–5.1)
SODIUM: 143 mmol/L (ref 135–145)

## 2018-07-10 LAB — GLUCOSE, CAPILLARY
GLUCOSE-CAPILLARY: 198 mg/dL — AB (ref 70–99)
GLUCOSE-CAPILLARY: 168 mg/dL — AB (ref 70–99)
Glucose-Capillary: 158 mg/dL — ABNORMAL HIGH (ref 70–99)
Glucose-Capillary: 173 mg/dL — ABNORMAL HIGH (ref 70–99)
Glucose-Capillary: 175 mg/dL — ABNORMAL HIGH (ref 70–99)
Glucose-Capillary: 210 mg/dL — ABNORMAL HIGH (ref 70–99)

## 2018-07-10 LAB — CBC
HCT: 36.5 % — ABNORMAL LOW (ref 39.0–52.0)
Hemoglobin: 11.6 g/dL — ABNORMAL LOW (ref 13.0–17.0)
MCH: 33.8 pg (ref 26.0–34.0)
MCHC: 31.8 g/dL (ref 30.0–36.0)
MCV: 106.4 fL — ABNORMAL HIGH (ref 80.0–100.0)
Platelets: 352 10*3/uL (ref 150–400)
RBC: 3.43 MIL/uL — ABNORMAL LOW (ref 4.22–5.81)
RDW: 14.6 % (ref 11.5–15.5)
WBC: 18.7 10*3/uL — AB (ref 4.0–10.5)
nRBC: 0 % (ref 0.0–0.2)

## 2018-07-10 MED ORDER — POTASSIUM CHLORIDE 20 MEQ/15ML (10%) PO SOLN
40.0000 meq | Freq: Once | ORAL | Status: AC
  Administered 2018-07-10: 40 meq
  Filled 2018-07-10: qty 30

## 2018-07-10 MED ORDER — FUROSEMIDE 10 MG/ML IJ SOLN
20.0000 mg | Freq: Two times a day (BID) | INTRAMUSCULAR | Status: DC
  Administered 2018-07-10 – 2018-07-13 (×8): 20 mg via INTRAVENOUS
  Filled 2018-07-10 (×8): qty 2

## 2018-07-10 MED ORDER — FOLIC ACID 1 MG PO TABS
1.0000 mg | ORAL_TABLET | Freq: Every day | ORAL | Status: DC
  Administered 2018-07-11 – 2018-07-18 (×8): 1 mg
  Filled 2018-07-10 (×8): qty 1

## 2018-07-10 MED ORDER — VITAMIN B-1 100 MG PO TABS
100.0000 mg | ORAL_TABLET | Freq: Every day | ORAL | Status: DC
  Administered 2018-07-11 – 2018-07-18 (×8): 100 mg
  Filled 2018-07-10 (×8): qty 1

## 2018-07-10 MED ORDER — SODIUM CHLORIDE 3 % IN NEBU
4.0000 mL | INHALATION_SOLUTION | Freq: Two times a day (BID) | RESPIRATORY_TRACT | Status: AC
  Administered 2018-07-10 – 2018-07-13 (×6): 4 mL via RESPIRATORY_TRACT
  Filled 2018-07-10 (×6): qty 4

## 2018-07-10 MED ORDER — PANTOPRAZOLE SODIUM 40 MG PO PACK
40.0000 mg | PACK | Freq: Every day | ORAL | Status: DC
  Administered 2018-07-10 – 2018-07-15 (×6): 40 mg
  Filled 2018-07-10 (×8): qty 20

## 2018-07-10 NOTE — Progress Notes (Signed)
Pharmacy Antibiotic Note  Blake [name] is a 77 y.o. male admitted on 07/06/2018 with pneumonia.  Pharmacy has been consulted for zosyn dosing. Patient is on day 4 of treatment with zosyn. Patient's renal function remains stable, patient is current afebrile, WBC count is trending down from admission (33.3 > 18.7), patient's lactic acid has also trended down from admission from 2.12 to 0.8.   Plan: Continue Zosyn 3.375g IV q8h (4 hour infusion). Continue following renal function, CBC, clinical progression, and LOT   Height: 5\' 5"  (165.1 cm) Weight: 173 lb 15.1 oz (78.9 kg) IBW/kg (Calculated) : 61.5  Temp (24hrs), Avg:99.2 F (37.3 C), Min:98.1 F (36.7 C), Max:99.8 F (37.7 C)  Recent Labs  Lab 07/06/18 1932 07/06/18 2047 07/07/18 0116 07/07/18 0401 07/08/18 0409 07/09/18 0632 07/10/18 0357  WBC 33.3*  --   --  23.3* 24.0* 18.0* 18.7*  CREATININE 0.86  --   --  0.72 0.82 0.81 0.72  LATICACIDVEN  --  2.12* 0.9 0.8  --   --   --     Estimated Creatinine Clearance: 74.9 mL/min (by C-G formula based on SCr of 0.72 mg/dL).    No Known Allergies  Antimicrobials this admission: Zosyn 10/10>>   Dose adjustments this admission: N/A  Microbiology results: 10/10 BCx x1: NGTD 10/10 MRSA PCR: negative  Thank you for allowing pharmacy to be a part of this patient's care.  Leron Croak, PharmD PGY1 Pharmacy Resident Phone: [phone]  Please check AMION for all [place] phone numbers 07/10/2018 3:21 PM

## 2018-07-10 NOTE — Progress Notes (Signed)
CSW aware that pt is from [facility]. CSW continues to follow for further assessment of needs as pt remains intubated at this time at 40 percent. CSW will assess pt once alert and oriented.    Virgie Dad Chou Busler, MSW, [facility] Clinical Social Worker [phone]

## 2018-07-10 NOTE — Progress Notes (Signed)
NAME:  Blake [name], MRN:  [id number], DOB:  [birth year], LOS: 4 ADMISSION DATE:  07/06/2018, CONSULTATION DATE:  07/07/18 REFERRING MD:  Marlowe Sax CHIEF COMPLAINT:  AMS   Brief History   Blake [name] is a 77 y.o. male  Smoker from [facility] who was admitted with LLL HCAP and later developed worsening hypoxia with hypercapnia.  Intubated for acute hypercarbic respiratory failure  He had recent admission 06/25/18 through 06/29/18 for seizures and EtOH withdrawal and was discharged to [facility] for continued rehab efforts.  Significant Hospital Events   10/10 > admit. 10/11 > transfer to ICU for intubation.  Consults: date of consult/date signed off & final recs:  PCCM.  Procedures (surgical and bedside):  ETT 10/11 >   Significant Diagnostic Tests:  CXR 10/10 > LLL infiltrate.  Micro Data: Blood 10/10 > neg  Sputum 10/14 >    Antimicrobials:  Zosyn 10/10 >   Subjective:   Agitated this morning on wake-up assessment, back on fentanyl drip Afebrile Good urine output with Lasix   Objective   Blood pressure (!) 106/59, pulse 85, temperature 98.8 F (37.1 C), temperature source Axillary, resp. rate (!) 23, height 5\' 5"  (1.651 m), weight 78.9 kg, SpO2 97 %.    Vent Mode: PRVC FiO2 (%):  [40 %] 40 % Set Rate:  [16 bmp-20 bmp] 16 bmp Vt Set:  [490 mL] 490 mL PEEP:  [5 cmH20] 5 cmH20 Pressure Support:  [10 cmH20] 10 cmH20 Plateau Pressure:  [14 cmH20-22 cmH20] 14 cmH20   Intake/Output Summary (Last 24 hours) at 07/10/2018 1234 Last data filed at 07/10/2018 1100 Gross per 24 hour  Intake 2335.76 ml  Output 1430 ml  Net 905.76 ml   Filed Weights   07/06/18 2234 07/09/18 0342 07/10/18 0400  Weight: 67.8 kg 75.5 kg 78.9 kg    Examination: Gen. acutely ill-appearing elderly man, in no distress, orally intubated, sedated on fentanyl drip ENT - no pallor/icterus Neck: No JVD, no thyromegaly, no carotid bruits Lungs: no use of accessory muscles, no dullness  to percussion, decreased without rales or rhonchi , prolonged expiration and ventilator waveform Cardiovascular: Rhythm regular, heart sounds  normal, no murmurs or gallops, 1+ peripheral edema Abdomen: soft and non-tender, no hepatosplenomegaly, BS normal. Musculoskeletal: No deformities, no cyanosis or clubbing Neuro: Sedated, RA SS -2   Chest x-ray personally reviewed which shows left lower lobe infiltrate,?  Effusion  Assessment & Plan:   Acute hypercapnic and hypoxemic respiratory failure -   PLAN  Ventilator settings reviewed and adjusted Spontaneous breathing trials on pressure support  Left lower lobe HCAP. -Continue Zosyn , send respiratory culture -Add hypertonic saline nebs for thick secretions  Hx COPD, tobacco dependence. -Bronchodilators, nicotine patch   Shock, presumed multifactorial including septic and hypovolemic, possible component of sedating medication>resolved  -phenylephrine -off 10/12    Volume Overload -11L positive since admit , good response to Lasix  -Gentle diuresis -Lasix 20mg  IV q12 Hypokalemia will be repleted   Hx Stroke. -Continue pre-admission aspirin  Hx Seizures. -Continue his home Keppra  Hx HTN, HLD. -Remains on gemfibrozil  Hx EtOH abuse. -Thiamine and folic acid added -Watch for evidence of withdrawal symptoms  Disposition / Summary of Today's Plan 07/10/18   Diuresis with Lasix, add hypertonic saline nebs for thick secretions Spontaneous breathing trials but hold off extubation until secretions decreased      DVT prophylaxis: SCD's / Lovenox. GI prophylaxis: famotidine. Hyperglycemia protocol: None. Mobility: Bedrest. Code Status: Full. Family  Communication: None available.  Labs   CBC: Recent Labs  Lab 07/06/18 1932 07/07/18 0401 07/08/18 0409 07/09/18 0632 07/10/18 0357  WBC 33.3* 23.3* 24.0* 18.0* 18.7*  NEUTROABS 30.3*  --   --   --   --   HGB 15.2 11.4* 11.6* 11.4* 11.6*  HCT 47.2 36.6* 36.0*  36.1* 36.5*  MCV 105.8* 108.6* 107.1* 107.8* 106.4*  PLT 369 272 353 348 992   Basic Metabolic Panel: Recent Labs  Lab 07/06/18 1932 07/07/18 0401 07/08/18 0409 07/09/18 0632 07/10/18 0357  NA 136 142 143 142 143  K 4.3 3.7 4.0 3.6 3.4*  CL 99 112* 115* 116* 115*  CO2 25 22 21* 20* 24  GLUCOSE 143* 86 99 165* 202*  BUN 15 15 16 14 15   CREATININE 0.86 0.72 0.82 0.81 0.72  CALCIUM 8.9 6.3* 7.7* 7.5* 7.7*  MG  --   --  2.0  --   --   PHOS  --   --  2.5  --   --    GFR: Estimated Creatinine Clearance: 74.9 mL/min (by C-G formula based on SCr of 0.72 mg/dL). Recent Labs  Lab 07/06/18 2047 07/07/18 0116 07/07/18 0401 07/08/18 0409 07/09/18 0632 07/10/18 0357  WBC  --   --  23.3* 24.0* 18.0* 18.7*  LATICACIDVEN 2.12* 0.9 0.8  --   --   --    Liver Function Tests: No results for input(s): AST, ALT, ALKPHOS, BILITOT, PROT, ALBUMIN in the last 168 hours. No results for input(s): LIPASE, AMYLASE in the last 168 hours. No results for input(s): AMMONIA in the last 168 hours. ABG    Component Value Date/Time   PHART 7.315 (L) 07/08/2018 0445   PCO2ART 38.9 07/08/2018 0445   PO2ART 87.0 07/08/2018 0445   HCO3 19.7 (L) 07/08/2018 0445   TCO2 21 (L) 07/08/2018 0445   ACIDBASEDEF 6.0 (H) 07/08/2018 0445   O2SAT 96.0 07/08/2018 0445    Coagulation Profile: No results for input(s): INR, PROTIME in the last 168 hours. Cardiac Enzymes: No results for input(s): CKTOTAL, CKMB, CKMBINDEX, TROPONINI in the last 168 hours. HbA1C: Hgb A1c MFr Bld  Date/Time Value Ref Range Status  08/27/2016 10:20 AM 4.5 (L) 4.6 - 6.5 % Final    Comment:    Glycemic Control Guidelines for People with Diabetes:Non Diabetic:  <6%Goal of Therapy: <7%Additional Action Suggested:  >8%   10/15/2014 03:17 AM 4.7 <5.7 % Final    Comment:    (NOTE)                                                                       According to the ADA Clinical Practice Recommendations for 2011, when HbA1c is used as a  screening test:  >=6.5%   Diagnostic of Diabetes Mellitus           (if abnormal result is confirmed) 5.7-6.4%   Increased risk of developing Diabetes Mellitus References:Diagnosis and Classification of Diabetes Mellitus,Diabetes EQAS,3419,62(IWLNL 1):S62-S69 and Standards of Medical Care in         Diabetes - 2011,Diabetes GXQJ,1941,74 (Suppl 1):S11-S61.    CBG: Recent Labs  Lab 07/09/18 1925 07/09/18 2339 07/10/18 0345 07/10/18 0706 07/10/18 1105  GLUCAP 198* 176* 198* 210* 158*  My independent critical care time x 64m  Kara Mead MD. FCCP. [facility] Pager [phone] If no response call [phone]   07/10/2018

## 2018-07-10 NOTE — Progress Notes (Signed)
Inpatient Diabetes Program Recommendations  AACE/ADA: New Consensus Statement on Inpatient Glycemic Control (2015)  Target Ranges:  Prepandial:   less than 140 mg/dL      Peak postprandial:   less than 180 mg/dL (1-2 hours)      Critically ill patients:  140 - 180 mg/dL   Results for STEPEHN, ECKARD (MRN [id number]) as of 07/10/2018 12:12  Ref. Range 07/09/2018 19:25 07/09/2018 23:39 07/10/2018 03:45 07/10/2018 07:06 07/10/2018 11:05  Glucose-Capillary Latest Ref Range: 70 - 99 mg/dL 198 (H) 176 (H) 198 (H) 210 (H) 158 (H)   Review of Glycemic Control  Diabetes history: None  Current orders for Inpatient glycemic control: None  Inpatient Diabetes Program Recommendations:    Noted patient on Tube Feeds. Consider ICU Glycemic Control phase 1 SQ insulin 1-3 units Q4 hours to prevent hyperglycemia.   Thanks,  Tama Headings RN, MSN, BC-ADM Inpatient Diabetes Coordinator Team Pager [phone] (8a-5p)

## 2018-07-11 ENCOUNTER — Encounter (HOSPITAL_COMMUNITY): Payer: Medicare Other

## 2018-07-11 ENCOUNTER — Inpatient Hospital Stay (HOSPITAL_COMMUNITY): Payer: Medicare Other

## 2018-07-11 LAB — GLUCOSE, CAPILLARY
GLUCOSE-CAPILLARY: 227 mg/dL — AB (ref 70–99)
GLUCOSE-CAPILLARY: 179 mg/dL — AB (ref 70–99)
GLUCOSE-CAPILLARY: 184 mg/dL — AB (ref 70–99)
Glucose-Capillary: 169 mg/dL — ABNORMAL HIGH (ref 70–99)
Glucose-Capillary: 152 mg/dL — ABNORMAL HIGH (ref 70–99)

## 2018-07-11 LAB — CBC
HEMATOCRIT: 36.7 % — AB (ref 39.0–52.0)
HEMOGLOBIN: 11.4 g/dL — AB (ref 13.0–17.0)
MCH: 33.2 pg (ref 26.0–34.0)
MCHC: 31.1 g/dL (ref 30.0–36.0)
MCV: 107 fL — AB (ref 80.0–100.0)
Platelets: 372 10*3/uL (ref 150–400)
RBC: 3.43 MIL/uL — AB (ref 4.22–5.81)
RDW: 14.7 % (ref 11.5–15.5)
WBC: 17.3 10*3/uL — ABNORMAL HIGH (ref 4.0–10.5)
nRBC: 0 % (ref 0.0–0.2)

## 2018-07-11 LAB — BASIC METABOLIC PANEL
Anion gap: 5 (ref 5–15)
BUN: 15 mg/dL (ref 8–23)
CHLORIDE: 112 mmol/L — AB (ref 98–111)
CO2: 27 mmol/L (ref 22–32)
Calcium: 7.9 mg/dL — ABNORMAL LOW (ref 8.9–10.3)
Creatinine, Ser: 0.77 mg/dL (ref 0.61–1.24)
GFR calc Af Amer: >60 mL/min (ref >60)
GFR calc non Af Amer: >60 mL/min (ref >60)
GLUCOSE: 220 mg/dL — AB (ref 70–99)
POTASSIUM: 3.7 mmol/L (ref 3.5–5.1)
Sodium: 144 mmol/L (ref 135–145)

## 2018-07-11 LAB — CULTURE, BLOOD (ROUTINE X 2): Culture: NO GROWTH

## 2018-07-11 LAB — PHOSPHORUS: Phosphorus: 3.2 mg/dL (ref 2.5–4.6)

## 2018-07-11 LAB — MAGNESIUM: MAGNESIUM: 1.9 mg/dL (ref 1.7–2.4)

## 2018-07-11 MED ORDER — POTASSIUM CHLORIDE 20 MEQ/15ML (10%) PO SOLN
40.0000 meq | Freq: Once | ORAL | Status: AC
  Administered 2018-07-11: 40 meq
  Filled 2018-07-11: qty 30

## 2018-07-11 MED ORDER — INSULIN ASPART 100 UNIT/ML ~~LOC~~ SOLN
0.0000 [IU] | SUBCUTANEOUS | Status: DC
  Administered 2018-07-11 – 2018-07-12 (×11): 2 [IU] via SUBCUTANEOUS
  Administered 2018-07-13 (×2): 1 [IU] via SUBCUTANEOUS
  Administered 2018-07-13 (×3): 2 [IU] via SUBCUTANEOUS
  Administered 2018-07-13: 3 [IU] via SUBCUTANEOUS
  Administered 2018-07-14 (×2): 2 [IU] via SUBCUTANEOUS
  Administered 2018-07-14: 1 [IU] via SUBCUTANEOUS
  Administered 2018-07-14: 2 [IU] via SUBCUTANEOUS
  Administered 2018-07-14: 1 [IU] via SUBCUTANEOUS
  Administered 2018-07-14 – 2018-07-15 (×2): 2 [IU] via SUBCUTANEOUS
  Administered 2018-07-15 (×3): 1 [IU] via SUBCUTANEOUS
  Administered 2018-07-15: 2 [IU] via SUBCUTANEOUS
  Administered 2018-07-16: 1 [IU] via SUBCUTANEOUS
  Administered 2018-07-16 (×2): 2 [IU] via SUBCUTANEOUS
  Administered 2018-07-16: 1 [IU] via SUBCUTANEOUS
  Administered 2018-07-16: 2 [IU] via SUBCUTANEOUS
  Administered 2018-07-17: 1 [IU] via SUBCUTANEOUS
  Administered 2018-07-17: 2 [IU] via SUBCUTANEOUS
  Administered 2018-07-17: 1 [IU] via SUBCUTANEOUS

## 2018-07-11 MED ORDER — DEXMEDETOMIDINE HCL IN NACL 400 MCG/100ML IV SOLN
0.4000 ug/kg/h | INTRAVENOUS | Status: DC
  Administered 2018-07-11: 0.4 ug/kg/h via INTRAVENOUS
  Administered 2018-07-11 (×2): 0.9 ug/kg/h via INTRAVENOUS
  Administered 2018-07-12: 0.6 ug/kg/h via INTRAVENOUS
  Filled 2018-07-11 (×4): qty 100

## 2018-07-11 NOTE — Progress Notes (Signed)
76 year old smoker, nursing home resident admitted with left lower lobe HCAP and intubated for acute hypercarbic respiratory failure. He had recent admission 06/25/18 through 06/29/18 for seizures and EtOH withdrawal and was discharged to [facility] for rehab   He is afebrile, but appears extremely anxious while leaning on pressure support 10/5 with low tidal volumes.  Respiratory rate intermittently goes into the 30s, Breath sounds are bilateral decreased with scattered rhonchi, 1+ edema, no JVD, S1-S2 normal.  Labs were reviewed which show hypokalemia and stable leukocytosis. Chest x-ray personally reviewed which shows persistent left lower lobe consolidation. CBGs are high.  Impression/plan Acute hypercarbic respiratory failure-ventilator settings were reviewed and adjusted, please support trials as he tolerates, continue bronchodilators for underlying COPD. Continue Zosyn for left lower lobe HCAP while awaiting cultures. Added hypertonic saline nebs for thick secretions  Acute metabolic encephalopathy-add Precedex low-dose for severe anxiety and keep fentanyl intermittent for pain/sedation. Keppra will be continued. No obvious withdrawal symptoms but hopefully Precedex will help  Hypokalemia will be repleted.  My critical care time x 80m  Kara Mead MD. FCCP. [facility] Pager [phone] If no response call [phone]   07/11/2018

## 2018-07-11 NOTE — Progress Notes (Signed)
Pt placed back on full vent support due to increased RR and low VT.  RN changing sedation medications.

## 2018-07-11 NOTE — Progress Notes (Signed)
Nutrition Follow-up  DOCUMENTATION CODES:   Non-severe (moderate) malnutrition in context of chronic illness  INTERVENTION:   Continue TF via OGT:  Vital AF 1.2 at 60 ml/h (1440 ml per day)  Provides 1728 kcal, 108 gm protein, 1168 ml free water daily  NUTRITION DIAGNOSIS:   Moderate Malnutrition related to chronic illness(COPD) as evidenced by moderate fat depletion, severe fat depletion, mild muscle depletion, moderate muscle depletion.  Ongoing  GOAL:   Patient will meet greater than or equal to 90% of their needs  Met with TF  MONITOR:   Vent status, TF tolerance, Weight trends, Labs  ASSESSMENT:   77 yo male with PMH of COPD, pulmonary nodule, smoker, seizures, HTN, HLD, GERD, alcohol abuse, stroke who was admitted on 10/10 with HCAP. Respiratory status declined and required transfer to the ICU for intubation on 10/11.  TF was initiated on 10/12, currently receiving Vital AF 1.2 at 60 ml/h via OGT, providing 1728 kcal, 108 gm protein, 1168 ml free water daily. Patient tolerating without difficulty to meet 100% of estimated nutrition needs.  Patient remains intubated on ventilator support. MV: 10 L/min Temp (24hrs), Avg:99.2 F (37.3 C), Min:98.4 F (36.9 C), Max:100 F (37.8 C)  Labs reviewed. Magnesium, phosphorus, and potassium are WNL. CBG's: [phone] Medications reviewed and include folic acid, vitamin D, lasix, novolog, thiamine, precedex.   Weight trending up. I/O +10.2 L since admission  Diet Order:   Diet Order            Diet NPO time specified  Diet effective now              EDUCATION NEEDS:   No education needs have been identified at this time  Skin:  Skin Assessment: Reviewed RN Assessment  Last BM:  10/15 (type 4)  Height:   Ht Readings from Last 1 Encounters:  07/07/18 '5\' 5"'$  (1.651 m)    Weight:   Wt Readings from Last 1 Encounters:  07/11/18 79.4 kg   07/06/18 67.8 kg   Ideal Body Weight:  78.2 kg  BMI:   Body mass index is 29.13 kg/m.  Estimated Nutritional Needs:   Kcal:  8101  Protein:  100-115 gm  Fluid:  1.8-2 L    Molli Barrows, RD, LDN, [place] Pager [phone] After Hours Pager [phone]

## 2018-07-12 ENCOUNTER — Inpatient Hospital Stay (HOSPITAL_COMMUNITY): Payer: Medicare Other

## 2018-07-12 DIAGNOSIS — G9340 Encephalopathy, unspecified: Secondary | ICD-10-CM

## 2018-07-12 LAB — PHOSPHORUS: Phosphorus: 3.1 mg/dL (ref 2.5–4.6)

## 2018-07-12 LAB — CBC
HCT: 37.9 % — ABNORMAL LOW (ref 39.0–52.0)
Hemoglobin: 11.7 g/dL — ABNORMAL LOW (ref 13.0–17.0)
MCH: 33.3 pg (ref 26.0–34.0)
MCHC: 30.9 g/dL (ref 30.0–36.0)
MCV: 108 fL — AB (ref 80.0–100.0)
PLATELETS: 389 10*3/uL (ref 150–400)
RBC: 3.51 MIL/uL — ABNORMAL LOW (ref 4.22–5.81)
RDW: 14.6 % (ref 11.5–15.5)
WBC: 16 10*3/uL — ABNORMAL HIGH (ref 4.0–10.5)
nRBC: 0 % (ref 0.0–0.2)

## 2018-07-12 LAB — CULTURE, RESPIRATORY W GRAM STAIN

## 2018-07-12 LAB — GLUCOSE, CAPILLARY
GLUCOSE-CAPILLARY: 179 mg/dL — AB (ref 70–99)
GLUCOSE-CAPILLARY: 152 mg/dL — AB (ref 70–99)
Glucose-Capillary: 158 mg/dL — ABNORMAL HIGH (ref 70–99)
Glucose-Capillary: 158 mg/dL — ABNORMAL HIGH (ref 70–99)
Glucose-Capillary: 174 mg/dL — ABNORMAL HIGH (ref 70–99)
Glucose-Capillary: 195 mg/dL — ABNORMAL HIGH (ref 70–99)
Glucose-Capillary: 199 mg/dL — ABNORMAL HIGH (ref 70–99)

## 2018-07-12 LAB — BASIC METABOLIC PANEL
Anion gap: 5 (ref 5–15)
BUN: 21 mg/dL (ref 8–23)
CO2: 31 mmol/L (ref 22–32)
CREATININE: 0.77 mg/dL (ref 0.61–1.24)
Calcium: 8.2 mg/dL — ABNORMAL LOW (ref 8.9–10.3)
Chloride: 108 mmol/L (ref 98–111)
GFR calc Af Amer: >60 mL/min (ref >60)
GLUCOSE: 210 mg/dL — AB (ref 70–99)
POTASSIUM: 3.8 mmol/L (ref 3.5–5.1)
SODIUM: 144 mmol/L (ref 135–145)

## 2018-07-12 LAB — MAGNESIUM: MAGNESIUM: 2.2 mg/dL (ref 1.7–2.4)

## 2018-07-12 LAB — CULTURE, RESPIRATORY
CULTURE: NO GROWTH
GRAM STAIN: NONE SEEN

## 2018-07-12 MED ORDER — CLONAZEPAM 0.1 MG/ML ORAL SUSPENSION: 0.5000 mg | Freq: Every day | ORAL | Status: DC

## 2018-07-12 MED ORDER — FENTANYL 2500MCG IN NS 250ML (10MCG/ML) PREMIX INFUSION
0.0000 ug/h | INTRAVENOUS | Status: DC
  Administered 2018-07-12: 50 ug/h via INTRAVENOUS
  Administered 2018-07-13: 150 ug/h via INTRAVENOUS
  Administered 2018-07-14: 200 ug/h via INTRAVENOUS
  Administered 2018-07-14: 175 ug/h via INTRAVENOUS
  Administered 2018-07-15 – 2018-07-16 (×2): 200 ug/h via INTRAVENOUS
  Filled 2018-07-12 (×6): qty 250

## 2018-07-12 MED ORDER — QUETIAPINE FUMARATE 50 MG PO TABS
50.0000 mg | ORAL_TABLET | Freq: Two times a day (BID) | ORAL | Status: DC
  Administered 2018-07-12 – 2018-07-14 (×5): 50 mg
  Filled 2018-07-12 (×5): qty 1

## 2018-07-12 MED ORDER — FENTANYL CITRATE (PF) 100 MCG/2ML IJ SOLN
25.0000 ug | INTRAMUSCULAR | Status: DC | PRN
  Administered 2018-07-12: 50 ug via INTRAVENOUS
  Administered 2018-07-12: 25 ug via INTRAVENOUS
  Administered 2018-07-12 (×2): 50 ug via INTRAVENOUS
  Filled 2018-07-12 (×4): qty 2

## 2018-07-12 MED ORDER — FENTANYL CITRATE (PF) 100 MCG/2ML IJ SOLN
50.0000 ug | INTRAMUSCULAR | Status: DC | PRN
  Administered 2018-07-12: 100 ug via INTRAVENOUS
  Administered 2018-07-12: 50 ug via INTRAVENOUS
  Administered 2018-07-12 – 2018-07-13 (×3): 100 ug via INTRAVENOUS
  Administered 2018-07-13 – 2018-07-14 (×3): 50 ug via INTRAVENOUS
  Administered 2018-07-15 (×2): 100 ug via INTRAVENOUS
  Filled 2018-07-12: qty 2

## 2018-07-12 MED ORDER — CLONAZEPAM 0.5 MG PO TBDP
0.5000 mg | ORAL_TABLET | Freq: Every day | ORAL | Status: DC
  Administered 2018-07-12 – 2018-07-18 (×7): 0.5 mg
  Filled 2018-07-12 (×7): qty 1

## 2018-07-12 MED ORDER — HALOPERIDOL LACTATE 5 MG/ML IJ SOLN
1.0000 mg | INTRAMUSCULAR | Status: DC | PRN
  Administered 2018-07-12: 4 mg via INTRAVENOUS
  Administered 2018-07-12: 1 mg via INTRAVENOUS
  Administered 2018-07-18: 2.5 mg via INTRAVENOUS
  Administered 2018-07-19: 4 mg via INTRAVENOUS
  Administered 2018-07-20: 2 mg via INTRAVENOUS
  Administered 2018-07-20: 4 mg via INTRAVENOUS
  Filled 2018-07-12 (×7): qty 1

## 2018-07-12 MED ORDER — POTASSIUM CHLORIDE 20 MEQ/15ML (10%) PO SOLN
40.0000 meq | Freq: Once | ORAL | Status: AC
  Administered 2018-07-12: 40 meq
  Filled 2018-07-12: qty 30

## 2018-07-12 NOTE — Progress Notes (Signed)
Patient continues to pull at ETT patient has safety mitten in place and does follow commands. Dr. Elsworth Soho in to see patient will review medication with pharmacy.

## 2018-07-12 NOTE — Progress Notes (Signed)
NAME:  Blake [name], MRN:  [id number], DOB:  03-18-1941, LOS: 6 ADMISSION DATE:  07/06/2018, CONSULTATION DATE:  07/07/18 REFERRING MD:  Marlowe Sax CHIEF COMPLAINT:  AMS   Brief History   Blake [name] is a 77 y.o. male  Smoker from [facility] who was admitted with LLL HCAP and later developed worsening hypoxia with hypercapnia.  Intubated for acute hypercarbic respiratory failure  He had recent admission 06/25/18 through 06/29/18 for seizures and EtOH withdrawal and was discharged to [facility] for continued rehab efforts.  Significant Hospital Events   10/10 > admit. 10/11 > transfer to ICU for intubation. 10/15 precedex >> brady 20's on 0.6  Consults: date of consult/date signed off & final recs:  PCCM.  Procedures (surgical and bedside):  ETT 10/11 >   Significant Diagnostic Tests:  CXR 10/10 > LLL infiltrate.  Micro Data: Blood 10/10 > neg  Sputum 10/14 >    Antimicrobials:  Zosyn 10/10 >   Subjective:  Remains critically ill, orally intubated, severe intermittent agitation in spite of intermittent fentanyl and Versed. Precedex stopped overnight due to severe bradycardia Good urine output with Lasix   Objective   Blood pressure (!) 150/75, pulse (!) 105, temperature 98.8 F (37.1 C), temperature source Oral, resp. rate (!) 43, height 5\' 5"  (1.651 m), weight 76 kg, SpO2 95 %.    Vent Mode: PRVC FiO2 (%):  [40 %-50 %] 50 % Set Rate:  [16 bmp] 16 bmp Vt Set:  [490 mL] 490 mL PEEP:  [5 cmH20] 5 cmH20 Pressure Support:  [15 cmH20] 15 cmH20 Plateau Pressure:  [15 cmH20-32 cmH20] 32 cmH20   Intake/Output Summary (Last 24 hours) at 07/12/2018 1004 Last data filed at 07/12/2018 0900 Gross per 24 hour  Intake 2266.34 ml  Output 1990 ml  Net 276.34 ml   Filed Weights   07/10/18 0400 07/11/18 0402 07/12/18 0319  Weight: 78.9 kg 79.4 kg 76 kg    Examination:  Elderly man, mild respiratory distress Agitated, eyes wide open, does not follow commands,  reaching for ET tube Decreased breath sounds bilateral, few scattered rhonchi S1-S2 normal, tacky 1+ edema both arms and legs Soft and nontender abdomen  Chest x-ray personally reviewed from today which shows mild improvement in left lower lobe aeration but suspect effusion  Resolved problems Shock, presumed multifactorial including septic and hypovolemic, possible component of sedating medication>> phenylephrine -off 10/12   Assessment & Plan:   Acute hypercapnic and hypoxemic respiratory failure -   PLAN  Ventilator settings reviewed and adjusted Spontaneous breathing trials -tolerates pressure support 10/5 but limited by agitation  Left lower lobe HCAP. -Continue Zosyn , respiratory culture negative so far -ct hypertonic saline nebs for thick secretions -Position left side up and chest PT to help with secretions  Hx COPD, tobacco dependence. -Bronchodilators, nicotine patch   Volume Overload -10L positive since admit , good response to Lasix  -ct Lasix 20mg  IV q12 Hypokalemia will be repleted  Acute encephalopathy Hx Stroke & Seizures. -Continue pre-admission aspirin &  Keppra -Did not tolerate Precedex due to severe bradycardia, add Haldol as needed -Add Seroquel 50 twice daily and Klonopin 0.5 mg and titrate to effect  Hx HTN, HLD. -Remains on gemfibrozil  Hx EtOH abuse. -Thiamine and folate -Watch for evidence of withdrawal symptoms  Disposition / Summary of Today's Plan 07/12/18    Weaning is still limited by agitation and secretions      DVT prophylaxis: SCD's / Lovenox. GI prophylaxis: famotidine.  Code Status: Full. Family Communication: None available.  Labs   CBC: Recent Labs  Lab 07/06/18 1932  07/08/18 0409 07/09/18 1191 07/10/18 0357 07/11/18 0358 07/12/18 0400  WBC 33.3*   < > 24.0* 18.0* 18.7* 17.3* 16.0*  NEUTROABS 30.3*  --   --   --   --   --   --   HGB 15.2   < > 11.6* 11.4* 11.6* 11.4* 11.7*  HCT 47.2   < > 36.0* 36.1*  36.5* 36.7* 37.9*  MCV 105.8*   < > 107.1* 107.8* 106.4* 107.0* 108.0*  PLT 369   < > 353 348 352 372 389   < > = values in this interval not displayed.   Basic Metabolic Panel: Recent Labs  Lab 07/08/18 0409 07/09/18 0632 07/10/18 0357 07/11/18 0358 07/12/18 0400  NA 143 142 143 144 144  K 4.0 3.6 3.4* 3.7 3.8  CL 115* 116* 115* 112* 108  CO2 21* 20* 24 27 31   GLUCOSE 99 165* 202* 220* 210*  BUN 16 14 15 15 21   CREATININE 0.82 0.81 0.72 0.77 0.77  CALCIUM 7.7* 7.5* 7.7* 7.9* 8.2*  MG 2.0  --   --  1.9 2.2  PHOS 2.5  --   --  3.2 3.1   GFR: Estimated Creatinine Clearance: 73.6 mL/min (by C-G formula based on SCr of 0.77 mg/dL). Recent Labs  Lab 07/06/18 2047 07/07/18 0116 07/07/18 0401  07/09/18 0632 07/10/18 0357 07/11/18 0358 07/12/18 0400  WBC  --   --  23.3*   < > 18.0* 18.7* 17.3* 16.0*  LATICACIDVEN 2.12* 0.9 0.8  --   --   --   --   --    < > = values in this interval not displayed.   Liver Function Tests: No results for input(s): AST, ALT, ALKPHOS, BILITOT, PROT, ALBUMIN in the last 168 hours. No results for input(s): LIPASE, AMYLASE in the last 168 hours. No results for input(s): AMMONIA in the last 168 hours. ABG    Component Value Date/Time   PHART 7.315 (L) 07/08/2018 0445   PCO2ART 38.9 07/08/2018 0445   PO2ART 87.0 07/08/2018 0445   HCO3 19.7 (L) 07/08/2018 0445   TCO2 21 (L) 07/08/2018 0445   ACIDBASEDEF 6.0 (H) 07/08/2018 0445   O2SAT 96.0 07/08/2018 0445    Coagulation Profile: No results for input(s): INR, PROTIME in the last 168 hours. Cardiac Enzymes: No results for input(s): CKTOTAL, CKMB, CKMBINDEX, TROPONINI in the last 168 hours. HbA1C: Hgb A1c MFr Bld  Date/Time Value Ref Range Status  08/27/2016 10:20 AM 4.5 (L) 4.6 - 6.5 % Final    Comment:    Glycemic Control Guidelines for People with Diabetes:Non Diabetic:  <6%Goal of Therapy: <7%Additional Action Suggested:  >8%   10/15/2014 03:17 AM 4.7 <5.7 % Final    Comment:     (NOTE)                                                                       According to the ADA Clinical Practice Recommendations for 2011, when HbA1c is used as a screening test:  >=6.5%   Diagnostic of Diabetes Mellitus           (if abnormal result  is confirmed) 5.7-6.4%   Increased risk of developing Diabetes Mellitus References:Diagnosis and Classification of Diabetes Mellitus,Diabetes FYBO,1751,02(HENID 1):S62-S69 and Standards of Medical Care in         Diabetes - 2011,Diabetes POEU,2353,61 (Suppl 1):S11-S61.    CBG: Recent Labs  Lab 07/11/18 1535 07/11/18 1916 07/12/18 0003 07/12/18 0343 07/12/18 0713  GLUCAP 169* 152* 174* 199* 195*    My critical care time x 2m  Kara Mead MD. FCCP. [place] [facility] Pager [phone] If no response call [phone]   07/12/2018

## 2018-07-12 NOTE — Progress Notes (Signed)
eLink Physician-Brief Progress Note Patient Name: Blake [name] DOB: Jul 16, 1941 MRN: [id number]   Date of Service  07/12/2018  HPI/Events of Note  Pain - Nursing requests pain medicine.   eICU Interventions  Will order: 1. Fentanyl 25-50 mcg IV Q 2 hours PRN severe pain.      Intervention Category Major Interventions: Other:  Anyelina Claycomb Cornelia Copa 07/12/2018, 1:12 AM

## 2018-07-13 ENCOUNTER — Inpatient Hospital Stay (HOSPITAL_COMMUNITY): Payer: Medicare Other

## 2018-07-13 LAB — MAGNESIUM: Magnesium: 2.1 mg/dL (ref 1.7–2.4)

## 2018-07-13 LAB — PHOSPHORUS: PHOSPHORUS: 4 mg/dL (ref 2.5–4.6)

## 2018-07-13 LAB — CBC
HEMATOCRIT: 36.4 % — AB (ref 39.0–52.0)
HEMOGLOBIN: 11.5 g/dL — AB (ref 13.0–17.0)
MCH: 33.6 pg (ref 26.0–34.0)
MCHC: 31.6 g/dL (ref 30.0–36.0)
MCV: 106.4 fL — ABNORMAL HIGH (ref 80.0–100.0)
Platelets: 426 10*3/uL — ABNORMAL HIGH (ref 150–400)
RBC: 3.42 MIL/uL — ABNORMAL LOW (ref 4.22–5.81)
RDW: 14.6 % (ref 11.5–15.5)
WBC: 11.9 10*3/uL — AB (ref 4.0–10.5)
nRBC: 0 % (ref 0.0–0.2)

## 2018-07-13 LAB — BASIC METABOLIC PANEL
Anion gap: 7 (ref 5–15)
BUN: 18 mg/dL (ref 8–23)
CHLORIDE: 104 mmol/L (ref 98–111)
CO2: 33 mmol/L — AB (ref 22–32)
Calcium: 8.1 mg/dL — ABNORMAL LOW (ref 8.9–10.3)
Creatinine, Ser: 0.71 mg/dL (ref 0.61–1.24)
GFR calc non Af Amer: >60 mL/min (ref >60)
Glucose, Bld: 164 mg/dL — ABNORMAL HIGH (ref 70–99)
POTASSIUM: 3.4 mmol/L — AB (ref 3.5–5.1)
Sodium: 144 mmol/L (ref 135–145)

## 2018-07-13 LAB — GLUCOSE, CAPILLARY
GLUCOSE-CAPILLARY: 143 mg/dL — AB (ref 70–99)
GLUCOSE-CAPILLARY: 151 mg/dL — AB (ref 70–99)
GLUCOSE-CAPILLARY: 207 mg/dL — AB (ref 70–99)
Glucose-Capillary: 163 mg/dL — ABNORMAL HIGH (ref 70–99)
Glucose-Capillary: 200 mg/dL — ABNORMAL HIGH (ref 70–99)
Glucose-Capillary: 146 mg/dL — ABNORMAL HIGH (ref 70–99)

## 2018-07-13 LAB — PROCALCITONIN: Procalcitonin: 0.12 ng/mL

## 2018-07-13 MED ORDER — POTASSIUM CHLORIDE 20 MEQ/15ML (10%) PO SOLN
40.0000 meq | Freq: Once | ORAL | Status: AC
  Administered 2018-07-13: 40 meq
  Filled 2018-07-13: qty 30

## 2018-07-13 NOTE — Progress Notes (Signed)
RT NOTE: RT stopped chest PT (via chest vest) early due to patients RR in the 70's. RT will try again later today. Patient is currently tolerating CPAP/PSV 10/5 well. RT will continue to monitor.

## 2018-07-13 NOTE — Progress Notes (Signed)
77 year old smoker, nursing home resident admitted with left lower lobe HCAP and intubated for acute hypercarbic respiratory failure. He had recent admission 06/25/18 through 06/29/18 for seizures and EtOH withdrawal and was discharged to [facility] for rehab   His weaning attempts on pressure support continue to be limited by his intermittent agitation. When not agitated, decreased breath sounds bilateral without rhonchi, soft nontender abdomen and follows one-step commands, does not appear to be in pain.  He is afebrile and continues with moderate amount of secretions.  Labs show mild hypokalemia and improved leukocytosis. Chest x-ray personally reviewed shows left lower lobe aeration somewhat suspicion for small effusion.  Impression/plan  Acute hypercarbic respiratory failure -continue weaning attempts on pressure support not ready for extubation yet, limited by secretions and agitation Continue hypertonic saline to mobilize secretions and chest PT  HCAP left lower lobe-continue Zosyn until secretions persist, use procalcitonin to guide duration  Acute metabolic encephalopathy/extreme agitation-Precedex caused severe bradycardia, continue Keppra. Continue  low-dose Seroquel and Klonopin, okay to use fentanyl drip as needed. Hypokalemia will be repleted.  We will try to reach niece but unfortunately no POA to establish re- intubation status  My critical care time x 39m  Kara Mead MD. FCCP.  Pulmonary & Critical care Pager [phone] If no response call [phone]   07/13/2018

## 2018-07-13 NOTE — Progress Notes (Signed)
NAME:  Blake [name], MRN:  [id number], DOB:  1941-05-31, LOS: 7 ADMISSION DATE:  07/06/2018, CONSULTATION DATE:  07/07/18 REFERRING MD:  Marlowe Sax CHIEF COMPLAINT:  AMS   Brief History   Blake [name] is a 77 y.o. male  Smoker from [facility] who was admitted with LLL HCAP and later developed worsening hypoxia with hypercapnia.  Intubated for acute hypercarbic respiratory failure  He had recent admission 06/25/18 through 06/29/18 for seizures and EtOH withdrawal and was discharged to [facility] for continued rehab efforts.  Significant Hospital Events   10/10 > admit. 10/11 > transfer to ICU for intubation. 10/15 precedex >> brady 20's on 0.6 (avoid precedex)   Consults: date of consult/date signed off & final recs:  PCCM.  Procedures (surgical and bedside):  ETT 10/11 >   Significant Diagnostic Tests:  CXR 10/10 > LLL infiltrate.  Micro Data: Blood 10/10 > neg  Sputum 10/14 >   Antimicrobials:  Zosyn 10/10 >   Subjective:  Remains critically ill, orally intubated, severe intermittent agitation now being treated w/ seroqual/klonopin in addition to prn fentanyl and Versed. Good urine output with Lasix  Objective   Blood pressure (!) 100/55, pulse 75, temperature 98.8 F (37.1 C), temperature source Oral, resp. rate (!) 22, height 5\' 5"  (1.651 m), weight 75.1 kg, SpO2 95 %.    Vent Mode: CPAP;PSV FiO2 (%):  [40 %] 40 % Set Rate:  [16 bmp] 16 bmp Vt Set:  [490 mL] 490 mL PEEP:  [5 cmH20] 5 cmH20 Pressure Support:  [10 cmH20] 10 cmH20 Plateau Pressure:  [12 cmH20-22 cmH20] 15 cmH20   Intake/Output Summary (Last 24 hours) at 07/13/2018 1125 Last data filed at 07/13/2018 1100 Gross per 24 hour  Intake 2419.68 ml  Output 3060 ml  Net -640.32 ml   Filed Weights   07/11/18 0402 07/12/18 0319 07/13/18 0225  Weight: 79.4 kg 76 kg 75.1 kg    Examination:  Elderly man, intubated  Was sleeping soundly, aroused to belly palpation, smiled calmly and went back  to sleep Decrease breath sounds, mild rhonchi, lung exam hindered by vest S1/s2, rr on my exam 2+ edema to legs bilaterally. Soft belly, no pain response to exam  Chest x-ray personally reviewed from today which shows mild improvement in left lower lobe aeration but suspect effusion. Also question of developing right sided lung findings  Resolved problems Shock, presumed multifactorial including septic and hypovolemic, possible component of sedating medication>> phenylephrine -off 10/12   Assessment & Plan:   Acute hypercapnic and hypoxemic respiratory failure -  PLAN  Ventilator settings reviewed and adjusted Spontaneous breathing trials -tolerates pressure support 10/5 but limited by agitation, hopefully seroquel/klonopin will make future attempts succesful  Left lower lobe HCAP. -Continue Zosyn , respiratory culture negative so far -ct hypertonic saline nebs for thick secretions -Position left side up and chest PT to help with secretions (patient was laying flat on exam)  Hx COPD, tobacco dependence. -Bronchodilators, nicotine patch  Volume Overload - 2.5L positive since admit , good response to Lasix -ct Lasix 20mg  IV q12 Hypokalemia will be repleted  Acute encephalopathy Hx Stroke & Seizures. -Continue pre-admission aspirin &  Keppra -Did not tolerate Precedex due to severe bradycardia, add Haldol as needed -Add Seroquel 50 twice daily and Klonopin 0.5 mg and titrate to effect  Hx HTN, HLD. -Remains on gemfibrozil  Hx EtOH abuse. -Thiamine and folate -Watch for evidence of withdrawal symptoms  Disposition / Summary of Today's Plan 07/13/18  Weaning is still limited by agitation and secretions, patient calm on rounds today but was in mitts      DVT prophylaxis: SCD's / Lovenox. GI prophylaxis: famotidine.  Code Status: Full. Family Communication: None available.  Labs   CBC: Recent Labs  Lab 07/06/18 1932  07/09/18 3016 07/10/18 0357 07/11/18 0358  07/12/18 0400 07/13/18 0341  WBC 33.3*   < > 18.0* 18.7* 17.3* 16.0* 11.9*  NEUTROABS 30.3*  --   --   --   --   --   --   HGB 15.2   < > 11.4* 11.6* 11.4* 11.7* 11.5*  HCT 47.2   < > 36.1* 36.5* 36.7* 37.9* 36.4*  MCV 105.8*   < > 107.8* 106.4* 107.0* 108.0* 106.4*  PLT 369   < > 348 352 372 389 426*   < > = values in this interval not displayed.   Basic Metabolic Panel: Recent Labs  Lab 07/08/18 0409 07/09/18 0632 07/10/18 0357 07/11/18 0358 07/12/18 0400 07/13/18 0341  NA 143 142 143 144 144 144  K 4.0 3.6 3.4* 3.7 3.8 3.4*  CL 115* 116* 115* 112* 108 104  CO2 21* 20* 24 27 31  33*  GLUCOSE 99 165* 202* 220* 210* 164*  BUN 16 14 15 15 21 18   CREATININE 0.82 0.81 0.72 0.77 0.77 0.71  CALCIUM 7.7* 7.5* 7.7* 7.9* 8.2* 8.1*  MG 2.0  --   --  1.9 2.2 2.1  PHOS 2.5  --   --  3.2 3.1 4.0   GFR: Estimated Creatinine Clearance: 73.2 mL/min (by C-G formula based on SCr of 0.71 mg/dL). Recent Labs  Lab 07/06/18 2047 07/07/18 0116 07/07/18 0401  07/10/18 0357 07/11/18 0358 07/12/18 0400 07/13/18 0341  PROCALCITON  --   --   --   --   --   --   --  0.12  WBC  --   --  23.3*   < > 18.7* 17.3* 16.0* 11.9*  LATICACIDVEN 2.12* 0.9 0.8  --   --   --   --   --    < > = values in this interval not displayed.   Liver Function Tests: No results for input(s): AST, ALT, ALKPHOS, BILITOT, PROT, ALBUMIN in the last 168 hours. No results for input(s): LIPASE, AMYLASE in the last 168 hours. No results for input(s): AMMONIA in the last 168 hours. ABG    Component Value Date/Time   PHART 7.315 (L) 07/08/2018 0445   PCO2ART 38.9 07/08/2018 0445   PO2ART 87.0 07/08/2018 0445   HCO3 19.7 (L) 07/08/2018 0445   TCO2 21 (L) 07/08/2018 0445   ACIDBASEDEF 6.0 (H) 07/08/2018 0445   O2SAT 96.0 07/08/2018 0445    Coagulation Profile: No results for input(s): INR, PROTIME in the last 168 hours. Cardiac Enzymes: No results for input(s): CKTOTAL, CKMB, CKMBINDEX, TROPONINI in the last 168  hours. HbA1C: Hgb A1c MFr Bld  Date/Time Value Ref Range Status  08/27/2016 10:20 AM 4.5 (L) 4.6 - 6.5 % Final    Comment:    Glycemic Control Guidelines for People with Diabetes:Non Diabetic:  <6%Goal of Therapy: <7%Additional Action Suggested:  >8%   10/15/2014 03:17 AM 4.7 <5.7 % Final    Comment:    (NOTE)  According to the ADA Clinical Practice Recommendations for 2011, when HbA1c is used as a screening test:  >=6.5%   Diagnostic of Diabetes Mellitus           (if abnormal result is confirmed) 5.7-6.4%   Increased risk of developing Diabetes Mellitus References:Diagnosis and Classification of Diabetes Mellitus,Diabetes OEUM,3536,14(ERXVQ 1):S62-S69 and Standards of Medical Care in         Diabetes - 2011,Diabetes MGQQ,7619,50 (Suppl 1):S11-S61.    CBG: Recent Labs  Lab 07/12/18 1532 07/12/18 1929 07/12/18 2336 07/13/18 0401 07/13/18 [place], DO  PGY2 [place] 07/13/2018 11:43 AM    07/13/2018

## 2018-07-13 NOTE — Care Management (Addendum)
Pt is from [facility]  Pt remains on ventilator via ETT tube - pt may get trach.  Pt will be a potential candidate for LTACH once trached.  CM will continue to follow

## 2018-07-14 ENCOUNTER — Inpatient Hospital Stay (HOSPITAL_COMMUNITY): Payer: Medicare Other

## 2018-07-14 LAB — CBC
HCT: 37.1 % — ABNORMAL LOW (ref 39.0–52.0)
Hemoglobin: 11.6 g/dL — ABNORMAL LOW (ref 13.0–17.0)
MCH: 33.4 pg (ref 26.0–34.0)
MCHC: 31.3 g/dL (ref 30.0–36.0)
MCV: 106.9 fL — ABNORMAL HIGH (ref 80.0–100.0)
Platelets: 455 10*3/uL — ABNORMAL HIGH (ref 150–400)
RBC: 3.47 MIL/uL — ABNORMAL LOW (ref 4.22–5.81)
RDW: 14.7 % (ref 11.5–15.5)
WBC: 11.9 10*3/uL — ABNORMAL HIGH (ref 4.0–10.5)
nRBC: 0 % (ref 0.0–0.2)

## 2018-07-14 LAB — MAGNESIUM: Magnesium: 2.3 mg/dL (ref 1.7–2.4)

## 2018-07-14 LAB — GLUCOSE, CAPILLARY
GLUCOSE-CAPILLARY: 176 mg/dL — AB (ref 70–99)
GLUCOSE-CAPILLARY: 138 mg/dL — AB (ref 70–99)
Glucose-Capillary: 159 mg/dL — ABNORMAL HIGH (ref 70–99)
Glucose-Capillary: 152 mg/dL — ABNORMAL HIGH (ref 70–99)
Glucose-Capillary: 151 mg/dL — ABNORMAL HIGH (ref 70–99)
Glucose-Capillary: 137 mg/dL — ABNORMAL HIGH (ref 70–99)

## 2018-07-14 LAB — EXPECTORATED SPUTUM ASSESSMENT W REFEX TO RESP CULTURE

## 2018-07-14 LAB — BASIC METABOLIC PANEL
Anion gap: 7 (ref 5–15)
BUN: 20 mg/dL (ref 8–23)
CALCIUM: 8.1 mg/dL — AB (ref 8.9–10.3)
CHLORIDE: 103 mmol/L (ref 98–111)
CO2: 33 mmol/L — AB (ref 22–32)
CREATININE: 0.78 mg/dL (ref 0.61–1.24)
GFR calc non Af Amer: >60 mL/min (ref >60)
GLUCOSE: 180 mg/dL — AB (ref 70–99)
Potassium: 3.7 mmol/L (ref 3.5–5.1)
Sodium: 143 mmol/L (ref 135–145)

## 2018-07-14 LAB — PHOSPHORUS: Phosphorus: 3.9 mg/dL (ref 2.5–4.6)

## 2018-07-14 LAB — PROCALCITONIN: Procalcitonin: 0.12 ng/mL

## 2018-07-14 LAB — EXPECTORATED SPUTUM ASSESSMENT W GRAM STAIN, RFLX TO RESP C

## 2018-07-14 MED ORDER — FUROSEMIDE 10 MG/ML IJ SOLN
40.0000 mg | Freq: Two times a day (BID) | INTRAMUSCULAR | Status: DC
  Administered 2018-07-14 – 2018-07-21 (×14): 40 mg via INTRAVENOUS
  Filled 2018-07-14 (×15): qty 4

## 2018-07-14 MED ORDER — QUETIAPINE FUMARATE 100 MG PO TABS
100.0000 mg | ORAL_TABLET | Freq: Two times a day (BID) | ORAL | Status: DC
  Administered 2018-07-14 – 2018-07-18 (×7): 100 mg
  Filled 2018-07-14 (×8): qty 1

## 2018-07-14 MED ORDER — MIDAZOLAM HCL 2 MG/2ML IJ SOLN
INTRAMUSCULAR | Status: AC
  Administered 2018-07-14: 2 mg
  Filled 2018-07-14: qty 2

## 2018-07-14 NOTE — Progress Notes (Signed)
77 year old smoker, nursing home resident admitted 10/10 with left lower lobeHCAPand intubated for acute hypercarbic respiratory failure.He had recent admission 06/25/18 through 06/29/18 for seizures and EtOH withdrawal and was discharged to [facility] for rehab  He appears a little calmer this morning, decreased breath sounds on left, no rhonchi, follows one-step commands, nonfocal grossly, soft and nontender abdomen, S1-S2 normal. Moderate secretions per RN.  Labs show normal electrolytes and improved leukocytosis. Personally reviewed chest x-ray shows mild improvement in left lower lobe aeration and bilateral small effusions.  Impression/plan  Acute hypercarbic respiratory failure-try to wean on lower pressure support, extubation is being limited by agitation and copious secretions. Continue chest PT and hypertonic saline for toilet. Increase Lasix 40 every 12 for negative balance, he remains 10 L positive.  Left lower lobe HCAP -cultures negative, treated with Zosyn for total 10 days until 58/83  Acute metabolic encephalopathy with breakthrough agitation-did not tolerate Precedex due to severe bradycardia, increase Seroquel 200 mg twice daily and continue Klonopin 0.5, lower fentanyl drip.  Niece will be updated today. My critical care time x 70m  Kara Mead MD. FCCP.  Pulmonary & Critical care Pager [phone] If no response call [phone]   07/14/2018

## 2018-07-14 NOTE — Progress Notes (Signed)
RT NOTE: RT holding chest PT (via chest vest) at this time due to bronchoscopy procedure. RT will continue chest PT at next scheduled time. RT will continue to monitor.

## 2018-07-14 NOTE — Procedures (Signed)
Bronchoscopy Procedure Note BECKHAM CAPISTRAN [id number] 1941/09/18  Procedure: Bronchoscopy Indications: Remove secretions  Procedure Details Consent: Risks of procedure as well as the alternatives and risks of each were explained to the (patient/caregiver).  Consent for procedure obtained. Time Out: Verified patient identification, verified procedure, site/side was marked, verified correct patient position, special equipment/implants available, medications/allergies/relevent history reviewed, required imaging and test results available.  Performed  In preparation for procedure, patient was given 100% FiO2 and bronchoscope lubricated. Sedation: Benzodiazepines versed 2 , fent 100 mcg  Airway entered and the following bronchi were examined: Bronchi.   Procedures performed: BAL performed.Thick secretions suctioned out of both lower airways Bronchoscope removed.  , Patient placed back on 100% FiO2 at conclusion of procedure.    Evaluation Hemodynamic Status: BP stable throughout; O2 sats: stable throughout Patient's Current Condition: stable Specimens:  Sent purulent fluid Complications: No apparent complications Patient did tolerate procedure well.   Leanna Sato Elsworth [place] 07/14/2018

## 2018-07-14 NOTE — Progress Notes (Signed)
Pharmacy Antibiotic Note  Blake [name] is a 77 y.o. male admitted on 07/06/2018 with pneumonia.  Pharmacy has been consulted for zosyn dosing. Patient is on day 8 of 10 of treatment with zosyn. Patient's renal function remains stable, patient is current afebrile, WBC count is trending down from admission (33.3 > 22.9), patient's lactic acid has also trended down from admission from 2.12 to 0.8. PCT is unremarkable at 0.12.  Plan: Continue Zosyn 3.375g IV q8h (4 hour infusion) through 10/20 per MD Continue following renal function, CBC, clinical progression  Height: 5\' 5"  (165.1 cm) Weight: 167 lb 15.9 oz (76.2 kg) IBW/kg (Calculated) : 61.5  Temp (24hrs), Avg:98.9 F (37.2 C), Min:98.3 F (36.8 C), Max:100.1 F (37.8 C)  Recent Labs  Lab 07/10/18 0357 07/11/18 0358 07/12/18 0400 07/13/18 0341 07/14/18 0319  WBC 18.7* 17.3* 16.0* 11.9* 11.9*  CREATININE 0.72 0.77 0.77 0.71 0.78    Estimated Creatinine Clearance: 73.7 mL/min (by C-G formula based on SCr of 0.78 mg/dL).    No Known Allergies  Antimicrobials this admission: Zosyn 10/10>> (10/20)  Dose adjustments this admission: N/A  Microbiology results: 10/14 Tracheal aspirate: NGTD 10/10 BCx x1: NGTD 10/10 MRSA PCR: negative  Thank you for allowing pharmacy to be a part of this patient's care.  Leron Croak, PharmD PGY1 Pharmacy Resident Phone: [phone]  Please check AMION for all [place] phone numbers 07/14/2018 10:43 AM

## 2018-07-14 NOTE — Procedures (Signed)
Bedside Bronchoscopy Procedure Note Blake [name] [id number] 04-01-1941  Procedure: Bronchoscopy Indications: Remove secretions  Procedure Details: ET Tube Size: ET Tube secured at lip (cm): 23 Bite block in place: Yes In preparation for procedure, Patient hyper-oxygenated with 100 % FiO2 Airway entered and the following bronchi were examined: LLL.   Patient placed back on 100% FiO2 at conclusion of procedure.    Evaluation BP (!) 83/48 (BP Location: Left Arm)   Pulse 91   Temp 98.8 F (37.1 C) (Oral)   Resp 16   Ht 5\' 5"  (1.651 m)   Wt 76.2 kg   SpO2 98%   BMI 27.96 kg/m  Breath Sounds:Clear and Diminished O2 sats: stable throughout Patient's Current Condition: stable Specimens:  None Complications: No apparent complications Patient did tolerate procedure well.  RT at bedside for bronch. 19ml of saline was used during procedure. Patient is currently sating 98%, HR 91, RR16 and BP 139/60. RT will continue to monitor.   Vernona Rieger 07/14/2018, 4:07 PM

## 2018-07-15 ENCOUNTER — Inpatient Hospital Stay (HOSPITAL_COMMUNITY): Payer: Medicare Other

## 2018-07-15 LAB — GLUCOSE, CAPILLARY
GLUCOSE-CAPILLARY: 144 mg/dL — AB (ref 70–99)
GLUCOSE-CAPILLARY: 140 mg/dL — AB (ref 70–99)
Glucose-Capillary: 167 mg/dL — ABNORMAL HIGH (ref 70–99)
Glucose-Capillary: 163 mg/dL — ABNORMAL HIGH (ref 70–99)
Glucose-Capillary: 141 mg/dL — ABNORMAL HIGH (ref 70–99)

## 2018-07-15 LAB — BASIC METABOLIC PANEL
Anion gap: 7 (ref 5–15)
BUN: 24 mg/dL — ABNORMAL HIGH (ref 8–23)
CALCIUM: 8 mg/dL — AB (ref 8.9–10.3)
CO2: 35 mmol/L — AB (ref 22–32)
CREATININE: 0.8 mg/dL (ref 0.61–1.24)
Chloride: 102 mmol/L (ref 98–111)
GFR calc Af Amer: >60 mL/min (ref >60)
Glucose, Bld: 179 mg/dL — ABNORMAL HIGH (ref 70–99)
Potassium: 3.4 mmol/L — ABNORMAL LOW (ref 3.5–5.1)
Sodium: 144 mmol/L (ref 135–145)

## 2018-07-15 LAB — CBC
HEMATOCRIT: 37.9 % — AB (ref 39.0–52.0)
Hemoglobin: 11.8 g/dL — ABNORMAL LOW (ref 13.0–17.0)
MCH: 33.6 pg (ref 26.0–34.0)
MCHC: 31.1 g/dL (ref 30.0–36.0)
MCV: 108 fL — ABNORMAL HIGH (ref 80.0–100.0)
PLATELETS: 460 10*3/uL — AB (ref 150–400)
RBC: 3.51 MIL/uL — ABNORMAL LOW (ref 4.22–5.81)
RDW: 14.7 % (ref 11.5–15.5)
WBC: 12 10*3/uL — ABNORMAL HIGH (ref 4.0–10.5)
nRBC: 0 % (ref 0.0–0.2)

## 2018-07-15 LAB — PROCALCITONIN: PROCALCITONIN: 0.1 ng/mL

## 2018-07-15 LAB — PHOSPHORUS: PHOSPHORUS: 3.4 mg/dL (ref 2.5–4.6)

## 2018-07-15 LAB — MAGNESIUM: Magnesium: 2.1 mg/dL (ref 1.7–2.4)

## 2018-07-15 MED ORDER — POTASSIUM CHLORIDE 20 MEQ/15ML (10%) PO SOLN
40.0000 meq | Freq: Once | ORAL | Status: AC
  Administered 2018-07-15: 40 meq
  Filled 2018-07-15: qty 30

## 2018-07-15 NOTE — Progress Notes (Signed)
NAME:  Blake [name], MRN:  [id number], DOB:  Jul 23, 1941, LOS: 9 ADMISSION DATE:  07/06/2018, CONSULTATION DATE:  07/07/18 REFERRING MD:  Marlowe Sax CHIEF COMPLAINT:  AMS   Brief History   Blake [name] is a 77 y.o. male  Smoker from [facility] who was admitted with LLL HCAP and later developed worsening hypoxia with hypercapnia.  Intubated for acute hypercarbic respiratory failure  He had recent admission 06/25/18 through 06/29/18 for seizures and EtOH withdrawal and was discharged to [facility] for continued rehab efforts.  Significant Hospital Events   10/10 > admit. 10/11 > transfer to ICU for intubation. 10/15 precedex >> brady 20's on 0.6 (avoid precedex)   Consults: date of consult/date signed off & final recs:  PCCM.  Procedures (surgical and bedside):  ETT 10/11 >  bscopy 10/18   Significant Diagnostic Tests:  CXR 10/10 > LLL infiltrate.  Micro Data: Blood 10/10 > neg  Sputum 10/14 > ng resp 10/18 >>  Antimicrobials:  Zosyn 10/10 >   Subjective:  He remains critically ill on mechanical ventilation. Calm today on low-dose fentanyl drip. Afebrile    Objective   Blood pressure (!) 111/58, pulse 88, temperature 98.9 F (37.2 C), temperature source Axillary, resp. rate (!) 23, height 5\' 5"  (1.651 m), weight 76.4 kg, SpO2 94 %.    Vent Mode: CPAP;PSV FiO2 (%):  [30 %-100 %] 50 % Set Rate:  [16 bmp] 16 bmp Vt Set:  [490 mL] 490 mL PEEP:  [5 cmH20] 5 cmH20 Pressure Support:  [5 cmH20-8 cmH20] 5 cmH20 Plateau Pressure:  [11 cmH20-14 cmH20] 11 cmH20   Intake/Output Summary (Last 24 hours) at 07/15/2018 0858 Last data filed at 07/15/2018 0800 Gross per 24 hour  Intake 2239.06 ml  Output 2375 ml  Net -135.94 ml   Filed Weights   07/13/18 0225 07/14/18 0415 07/15/18 0435  Weight: 75.1 kg 76.2 kg 76.4 kg    Examination: Elderly man, appears chronically ill, calm, on low-dose fentanyl drip. Decreased breath sounds on left, clear on right, no  rhonchi S1-S2 normal Soft and nontender abdomen 1+ bipedal edema Follows one-step commands and nods to questions, grossly nonfocal  Chest x-ray from 10/19 personally reviewed which shows persistent left lower lobe consolidation   Resolved problems Shock, presumed multifactorial including septic and hypovolemic, possible component of sedating medication>> phenylephrine -off 10/12   Assessment & Plan:   Acute hypercapnic and hypoxemic respiratory failure -  PLAN  Spontaneous breathing trials -tolerates pressure support 5/5 with good tidal volume, mental status more appropriate. May be a good window for trial of extubation  Left lower lobe HCAP. -Continue Zosyn x 10 days total, respiratory culture negative so far -ct hypertonic saline nebs for thick secretions -Chest PT  Hx COPD, tobacco dependence. -Bronchodilators, nicotine patch  Volume Overload -  -ct Lasix 40mg  IV q12 Hypokalemia will be repleted  Acute encephalopathy Hx Stroke & Seizures. -Continue pre-admission aspirin &  Keppra -Did not tolerate Precedex due to severe bradycardia, -Titrated Seroquel 100 twice daily and Klonopin 0.5 mg daily  Hx HTN, HLD. -Remains on gemfibrozil  Hx EtOH abuse. -Thiamine and folate -No  evidence of withdrawal symptoms  Disposition / Summary of Today's Plan 07/15/18   Hopeful that we are at a point where we can consider extubation today or within the next 24 hours , secretions only limiting factor now      DVT prophylaxis: SCD's / Lovenox. GI prophylaxis: famotidine.  Code Status: Full. Family Communication: Niece Santiago Glad  from [facility] updated 10/18  Labs   CBC: Recent Labs  Lab 07/11/18 0358 07/12/18 0400 07/13/18 0341 07/14/18 0319 07/15/18 0308  WBC 17.3* 16.0* 11.9* 11.9* 12.0*  HGB 11.4* 11.7* 11.5* 11.6* 11.8*  HCT 36.7* 37.9* 36.4* 37.1* 37.9*  MCV 107.0* 108.0* 106.4* 106.9* 108.0*  PLT 372 389 426* 455* 409*   Basic Metabolic Panel: Recent  Labs  Lab 07/11/18 0358 07/12/18 0400 07/13/18 0341 07/14/18 0319 07/15/18 0308  NA 144 144 144 143 144  K 3.7 3.8 3.4* 3.7 3.4*  CL 112* 108 104 103 102  CO2 27 31 33* 33* 35*  GLUCOSE 220* 210* 164* 180* 179*  BUN 15 21 18 20  24*  CREATININE 0.77 0.77 0.71 0.78 0.80  CALCIUM 7.9* 8.2* 8.1* 8.1* 8.0*  MG 1.9 2.2 2.1 2.3 2.1  PHOS 3.2 3.1 4.0 3.9 3.4   GFR: Estimated Creatinine Clearance: 73.8 mL/min (by C-G formula based on SCr of 0.8 mg/dL). Recent Labs  Lab 07/12/18 0400 07/13/18 0341 07/14/18 0319 07/15/18 0308  PROCALCITON  --  0.12 0.12 0.10  WBC 16.0* 11.9* 11.9* 12.0*   Liver Function Tests: No results for input(s): AST, ALT, ALKPHOS, BILITOT, PROT, ALBUMIN in the last 168 hours. No results for input(s): LIPASE, AMYLASE in the last 168 hours. No results for input(s): AMMONIA in the last 168 hours. ABG    Component Value Date/Time   PHART 7.315 (L) 07/08/2018 0445   PCO2ART 38.9 07/08/2018 0445   PO2ART 87.0 07/08/2018 0445   HCO3 19.7 (L) 07/08/2018 0445   TCO2 21 (L) 07/08/2018 0445   ACIDBASEDEF 6.0 (H) 07/08/2018 0445   O2SAT 96.0 07/08/2018 0445    Coagulation Profile: No results for input(s): INR, PROTIME in the last 168 hours. Cardiac Enzymes: No results for input(s): CKTOTAL, CKMB, CKMBINDEX, TROPONINI in the last 168 hours. HbA1C: Hgb A1c MFr Bld  Date/Time Value Ref Range Status  08/27/2016 10:20 AM 4.5 (L) 4.6 - 6.5 % Final    Comment:    Glycemic Control Guidelines for People with Diabetes:Non Diabetic:  <6%Goal of Therapy: <7%Additional Action Suggested:  >8%   10/15/2014 03:17 AM 4.7 <5.7 % Final    Comment:    (NOTE)                                                                       According to the ADA Clinical Practice Recommendations for 2011, when HbA1c is used as a screening test:  >=6.5%   Diagnostic of Diabetes Mellitus           (if abnormal result is confirmed) 5.7-6.4%   Increased risk of developing Diabetes  [place] References:Diagnosis and Classification of Diabetes Mellitus,Diabetes WJXB,1478,29(FAOZH 1):S62-S69 and Standards of Medical Care in         Diabetes - 2011,Diabetes YQMV,7846,96 (Suppl 1):S11-S61.    CBG: Recent Labs  Lab 07/14/18 1123 07/14/18 1509 07/14/18 1914 07/14/18 2309 07/15/18 0312  GLUCAP 152* 151* 138* 137* 167*    My critical care time x 32 min  Kara Mead MD. FCCP. [place] [facility] Pager [phone] If no response call [phone]    07/15/2018 8:58 AM

## 2018-07-16 LAB — GLUCOSE, CAPILLARY
GLUCOSE-CAPILLARY: 130 mg/dL — AB (ref 70–99)
GLUCOSE-CAPILLARY: 172 mg/dL — AB (ref 70–99)
Glucose-Capillary: 112 mg/dL — ABNORMAL HIGH (ref 70–99)
Glucose-Capillary: 118 mg/dL — ABNORMAL HIGH (ref 70–99)
Glucose-Capillary: 150 mg/dL — ABNORMAL HIGH (ref 70–99)
Glucose-Capillary: 161 mg/dL — ABNORMAL HIGH (ref 70–99)
Glucose-Capillary: 176 mg/dL — ABNORMAL HIGH (ref 70–99)

## 2018-07-16 LAB — CBC
HCT: 38.8 % — ABNORMAL LOW (ref 39.0–52.0)
Hemoglobin: 12 g/dL — ABNORMAL LOW (ref 13.0–17.0)
MCH: 33.6 pg (ref 26.0–34.0)
MCHC: 30.9 g/dL (ref 30.0–36.0)
MCV: 108.7 fL — AB (ref 80.0–100.0)
PLATELETS: 496 10*3/uL — AB (ref 150–400)
RBC: 3.57 MIL/uL — ABNORMAL LOW (ref 4.22–5.81)
RDW: 14.4 % (ref 11.5–15.5)
WBC: 12.8 10*3/uL — ABNORMAL HIGH (ref 4.0–10.5)
nRBC: 0 % (ref 0.0–0.2)

## 2018-07-16 LAB — BASIC METABOLIC PANEL
Anion gap: 5 (ref 5–15)
BUN: 23 mg/dL (ref 8–23)
CO2: 38 mmol/L — ABNORMAL HIGH (ref 22–32)
Calcium: 8.5 mg/dL — ABNORMAL LOW (ref 8.9–10.3)
Chloride: 101 mmol/L (ref 98–111)
Creatinine, Ser: 0.68 mg/dL (ref 0.61–1.24)
GFR calc Af Amer: >60 mL/min (ref >60)
GLUCOSE: 151 mg/dL — AB (ref 70–99)
Potassium: 3.3 mmol/L — ABNORMAL LOW (ref 3.5–5.1)
Sodium: 144 mmol/L (ref 135–145)

## 2018-07-16 MED ORDER — IPRATROPIUM-ALBUTEROL 0.5-2.5 (3) MG/3ML IN SOLN
3.0000 mL | Freq: Three times a day (TID) | RESPIRATORY_TRACT | Status: DC
  Administered 2018-07-17 – 2018-07-23 (×18): 3 mL via RESPIRATORY_TRACT
  Filled 2018-07-16 (×20): qty 3

## 2018-07-16 MED ORDER — ORAL CARE MOUTH RINSE
15.0000 mL | Freq: Two times a day (BID) | OROMUCOSAL | Status: DC
  Administered 2018-07-16 – 2018-07-24 (×14): 15 mL via OROMUCOSAL

## 2018-07-16 MED ORDER — POTASSIUM CHLORIDE 20 MEQ/15ML (10%) PO SOLN
40.0000 meq | Freq: Once | ORAL | Status: AC
  Administered 2018-07-16: 40 meq
  Filled 2018-07-16: qty 30

## 2018-07-16 MED ORDER — METHYLPREDNISOLONE SODIUM SUCC 125 MG IJ SOLR
40.0000 mg | Freq: Every day | INTRAMUSCULAR | Status: AC
  Administered 2018-07-16 – 2018-07-18 (×3): 40 mg via INTRAVENOUS
  Filled 2018-07-16 (×3): qty 2

## 2018-07-16 NOTE — Procedures (Signed)
Extubation Procedure Note  Patient Details:   Name: Blake [name] DOB: 1940-10-06 MRN: [id number]   Airway Documentation:    Vent end date: 07/16/18 Vent end time: 1012   Evaluation  O2 sats: stable throughout Complications: No apparent complications Patient did tolerate procedure well. Bilateral Breath Sounds: Diminished   Yes   RT extubated patient to 4L [place] per MD order with RN at bedside. Patient had a positive cuff leak. No stridor noted. RT worked with patient on IS and patient completed 530ml. Patient currently looks comfortable and says his breathing is fine. RT will continue to monitor.   Vernona Rieger 07/16/2018, 10:15 AM

## 2018-07-16 NOTE — Progress Notes (Signed)
1000 125 ml of Fentanyl wasted witnessed by Shea Stakes, RN

## 2018-07-16 NOTE — Progress Notes (Signed)
NAME:  Blake [name], MRN:  [id number], DOB:  [birth year], LOS: 64 ADMISSION DATE:  07/06/2018, CONSULTATION DATE:  07/07/18 REFERRING MD:  Marlowe Sax CHIEF COMPLAINT:  AMS   Brief History   Blake [name] is a 77 y.o. male  Smoker from [facility] who was admitted with LLL HCAP and later developed worsening hypoxia with hypercapnia.  Intubated for acute hypercarbic respiratory failure  He had recent admission 06/25/18 through 06/29/18 for seizures and EtOH withdrawal and was discharged to [facility] for continued rehab efforts.  Significant Hospital Events   10/10 > admit. 10/11 > transfer to ICU for intubation. 10/15 precedex >> brady 20's on 0.6 (avoid precedex) 10/17 added seroquel /klonopin 10/19 weaned PS 5/5 x 30 mis but then tachypenic   Consults: date of consult/date signed off & final recs:  PCCM.  Procedures (surgical and bedside):  ETT 10/11 >  bscopy 10/18 >> thick secretions BLL  Significant Diagnostic Tests:    Micro Data: Blood 10/10 > neg  Sputum 10/14 > ng resp 10/18 >>ng  Antimicrobials:  Zosyn 10/10 >   Subjective:   Afebrile. Remains critically ill, intermittent agitation on fentanyl drip which is higher than yesterday. Overnight had mucous plugs and needed bag lavage    Objective   Blood pressure (!) 126/55, pulse 97, temperature 99.2 F (37.3 C), temperature source Axillary, resp. rate (!) 29, height 5\' 5"  (1.651 m), weight 76.4 kg, SpO2 92 %.    Vent Mode: CPAP;PSV FiO2 (%):  [50 %] 50 % Set Rate:  [16 bmp] 16 bmp Vt Set:  [490 mL] 490 mL PEEP:  [5 cmH20] 5 cmH20 Pressure Support:  [5 cmH20-8 cmH20] 8 cmH20 Plateau Pressure:  [13 cmH20-26 cmH20] 13 cmH20   Intake/Output Summary (Last 24 hours) at 07/16/2018 0901 Last data filed at 07/16/2018 0800 Gross per 24 hour  Intake 2175.2 ml  Output 3270 ml  Net -1094.8 ml   Filed Weights   07/14/18 0415 07/15/18 0435 07/16/18 0455  Weight: 76.2 kg 76.4 kg 76.4 kg     Examination:  Elderly man, appears chronically ill, calm with intermittent agitation on fentanyl drip RA SS -1, grossly nonfocal. Decreased breath sounds on the left, clear on the right, faint rhonchi S1-S2 normal Soft and nontender abdomen No pallor/icterus 1+ edema  Chest x-ray from 10/19 personally reviewed which shows persistent left lower lobe consolidation with slight improved aeration   Resolved problems Shock, presumed multifactorial including septic and hypovolemic, possible component of sedating medication>> phenylephrine -off 10/12   Assessment & Plan:   Acute hypercapnic and hypoxemic respiratory failure -  PLAN  He appears to tolerate pressure support weaning, agitation is better controlled but secretions still limiting factor for extubation Approaching window for trial of extubation  Left lower lobe HCAP. -Since culture negative can discontinue Zosyn today, total 10 days -ct hypertonic saline nebs for thick secretions -Chest PT  Hx COPD, tobacco dependence. -Bronchodilators, nicotine patch -Add Solu-Medrol 40 qd  Volume Overload -  -ct Lasix 40mg  IV q12 for negative balance Hypokalemia will be repleted  Acute encephalopathy Hx Stroke & Seizures. -Continue pre-admission aspirin &  Keppra -Did not tolerate Precedex due to severe bradycardia, -Titrated Seroquel 100 twice daily and Klonopin 0.5 mg daily  Hx HTN, HLD. -Remains on gemfibrozil  Hx EtOH abuse. -Thiamine and folate -No  evidence of withdrawal symptoms  Disposition / Summary of Today's Plan 07/16/18   Hopefully approaching window for extubation, secretions only limiting factor now , agitation is  better controlled He is obviously very deconditioned and will need aggressive rehab     DVT prophylaxis: SCD's / Lovenox. GI prophylaxis: famotidine.  Code Status: Full. Family Communication: Niece Santiago Glad from [place] updated 10/18  Labs   CBC: Recent Labs  Lab  07/12/18 0400 07/13/18 0341 07/14/18 0319 07/15/18 0308 07/16/18 0402  WBC 16.0* 11.9* 11.9* 12.0* 12.8*  HGB 11.7* 11.5* 11.6* 11.8* 12.0*  HCT 37.9* 36.4* 37.1* 37.9* 38.8*  MCV 108.0* 106.4* 106.9* 108.0* 108.7*  PLT 389 426* 455* 460* 616*   Basic Metabolic Panel: Recent Labs  Lab 07/11/18 0358 07/12/18 0400 07/13/18 0341 07/14/18 0319 07/15/18 0308 07/16/18 0402  NA 144 144 144 143 144 144  K 3.7 3.8 3.4* 3.7 3.4* 3.3*  CL 112* 108 104 103 102 101  CO2 27 31 33* 33* 35* 38*  GLUCOSE 220* 210* 164* 180* 179* 151*  BUN 15 21 18 20  24* 23  CREATININE 0.77 0.77 0.71 0.78 0.80 0.68  CALCIUM 7.9* 8.2* 8.1* 8.1* 8.0* 8.5*  MG 1.9 2.2 2.1 2.3 2.1  --   PHOS 3.2 3.1 4.0 3.9 3.4  --    GFR: Estimated Creatinine Clearance: 73.8 mL/min (by C-G formula based on SCr of 0.68 mg/dL). Recent Labs  Lab 07/13/18 0341 07/14/18 0319 07/15/18 0308 07/16/18 0402  PROCALCITON 0.12 0.12 0.10  --   WBC 11.9* 11.9* 12.0* 12.8*   Liver Function Tests: No results for input(s): AST, ALT, ALKPHOS, BILITOT, PROT, ALBUMIN in the last 168 hours. No results for input(s): LIPASE, AMYLASE in the last 168 hours. No results for input(s): AMMONIA in the last 168 hours. ABG    Component Value Date/Time   PHART 7.315 (L) 07/08/2018 0445   PCO2ART 38.9 07/08/2018 0445   PO2ART 87.0 07/08/2018 0445   HCO3 19.7 (L) 07/08/2018 0445   TCO2 21 (L) 07/08/2018 0445   ACIDBASEDEF 6.0 (H) 07/08/2018 0445   O2SAT 96.0 07/08/2018 0445    Coagulation Profile: No results for input(s): INR, PROTIME in the last 168 hours. Cardiac Enzymes: No results for input(s): CKTOTAL, CKMB, CKMBINDEX, TROPONINI in the last 168 hours. HbA1C: Hgb A1c MFr Bld  Date/Time Value Ref Range Status  08/27/2016 10:20 AM 4.5 (L) 4.6 - 6.5 % Final    Comment:    Glycemic Control Guidelines for People with Diabetes:Non Diabetic:  <6%Goal of Therapy: <7%Additional Action Suggested:  >8%   10/15/2014 03:17 AM 4.7 <5.7 % Final     Comment:    (NOTE)                                                                       According to the ADA Clinical Practice Recommendations for 2011, when HbA1c is used as a screening test:  >=6.5%   Diagnostic of Diabetes Mellitus           (if abnormal result is confirmed) 5.7-6.4%   Increased risk of developing Diabetes Mellitus References:Diagnosis and Classification of Diabetes Mellitus,Diabetes WVPX,1062,69(SWNIO 1):S62-S69 and Standards of Medical Care in         Diabetes - 2011,Diabetes EVOJ,5009,38 (Suppl 1):S11-S61.    CBG: Recent Labs  Lab 07/15/18 1525 07/15/18 1909 07/15/18 2352 07/16/18 0412 07/16/18 0714  GLUCAP 163* 141* 150*  130* 161*    My critical care time x 10m  Kara Mead MD. FCCP. [facility] Pager [phone] If no response call [phone]    07/16/2018 9:01 AM

## 2018-07-17 ENCOUNTER — Inpatient Hospital Stay (HOSPITAL_COMMUNITY): Payer: Medicare Other

## 2018-07-17 LAB — CBC
HCT: 37.5 % — ABNORMAL LOW (ref 39.0–52.0)
HEMOGLOBIN: 11.8 g/dL — AB (ref 13.0–17.0)
MCH: 33.5 pg (ref 26.0–34.0)
MCHC: 31.5 g/dL (ref 30.0–36.0)
MCV: 106.5 fL — ABNORMAL HIGH (ref 80.0–100.0)
PLATELETS: 511 10*3/uL — AB (ref 150–400)
RBC: 3.52 MIL/uL — ABNORMAL LOW (ref 4.22–5.81)
RDW: 14 % (ref 11.5–15.5)
WBC: 12.5 10*3/uL — ABNORMAL HIGH (ref 4.0–10.5)
nRBC: 0 % (ref 0.0–0.2)

## 2018-07-17 LAB — GLUCOSE, CAPILLARY
GLUCOSE-CAPILLARY: 122 mg/dL — AB (ref 70–99)
GLUCOSE-CAPILLARY: 174 mg/dL — AB (ref 70–99)
Glucose-Capillary: 109 mg/dL — ABNORMAL HIGH (ref 70–99)
Glucose-Capillary: 142 mg/dL — ABNORMAL HIGH (ref 70–99)
Glucose-Capillary: 104 mg/dL — ABNORMAL HIGH (ref 70–99)

## 2018-07-17 LAB — PHOSPHORUS: Phosphorus: 3.2 mg/dL (ref 2.5–4.6)

## 2018-07-17 LAB — BASIC METABOLIC PANEL
Anion gap: 8 (ref 5–15)
BUN: 26 mg/dL — AB (ref 8–23)
CHLORIDE: 104 mmol/L (ref 98–111)
CO2: 34 mmol/L — ABNORMAL HIGH (ref 22–32)
CREATININE: 0.89 mg/dL (ref 0.61–1.24)
Calcium: 8.7 mg/dL — ABNORMAL LOW (ref 8.9–10.3)
GFR calc Af Amer: >60 mL/min (ref >60)
GFR calc non Af Amer: >60 mL/min (ref >60)
GLUCOSE: 122 mg/dL — AB (ref 70–99)
Potassium: 3.2 mmol/L — ABNORMAL LOW (ref 3.5–5.1)
SODIUM: 146 mmol/L — AB (ref 135–145)

## 2018-07-17 LAB — MAGNESIUM: MAGNESIUM: 2.3 mg/dL (ref 1.7–2.4)

## 2018-07-17 LAB — CULTURE, RESPIRATORY W GRAM STAIN

## 2018-07-17 MED ORDER — PANTOPRAZOLE SODIUM 40 MG PO TBEC: 40.0000 mg | DELAYED_RELEASE_TABLET | Freq: Every day | ORAL | Status: DC

## 2018-07-17 MED ORDER — POTASSIUM CHLORIDE 10 MEQ/100ML IV SOLN
10.0000 meq | INTRAVENOUS | Status: AC
  Administered 2018-07-17 (×6): 10 meq via INTRAVENOUS
  Filled 2018-07-17 (×6): qty 100

## 2018-07-17 MED ORDER — PANTOPRAZOLE SODIUM 40 MG PO PACK
40.0000 mg | PACK | Freq: Every day | ORAL | Status: DC
  Administered 2018-07-17: 40 mg
  Filled 2018-07-17 (×2): qty 20

## 2018-07-17 NOTE — Progress Notes (Signed)
Modified Barium Swallow Progress Note  Patient Details  Name: Blake [name] MRN: [id number] Date of Birth: [birth year]  Today's Date: 07/17/2018  Modified Barium Swallow completed.  Full report located under Chart Review in the Imaging Section.  Brief recommendations include the following:  Clinical Impression  Pt presents with a primary esophageal, secondary pharyngeal dysphagia.  Swallow response occurred at the  pyriforms for liquids, valleculae for purees, with adequate airway closure but reduced pharyngeal/base of tongue constriction, leading to diffuse residue throughout the pharynx for all consistencies.  After several boluses of barium, material was viewed to backflow from the esophagus into the pharynx.  A scan of the esophagus revealed barium residue throughout.  Remarkably, there was no aspiration despite consumption of at least four oz of thin liquid barium and 2 oz applesauce. (There was penetration of thin liquids, but it was ejected from larynx upon completion of swallow -Penetration-Aspiration Scale rating of 2).    After study ended, pt regurgitated copious quantities of barium into emesis bag.  Per Sept 2019 clinical swallow evaluation, pt demonstrated s/s of an esophageal dysphagia.  MBS results reveal what is likely a chronic disorder  For now, recommend starting with ice chips/sips of water; crush meds and give with applesauce.  SLP will follow for safety/diet advancement.  If clinical presentation does not improve in a few days, pt may need esophageal w/u. D/W pt, who has poor awareness, and RN.     Swallow Evaluation Recommendations   Recommended Consults: Consider esophageal assessment   SLP Diet Recommendations: Other (Comment)(sips and chips)   Liquid Administration via: Straw;Cup   Medication Administration: Crushed with puree       Compensations: Slow rate;Small sips/bites   Postural Changes: Remain semi-upright after after feeds/meals (Comment);Seated  upright at 90 degrees          Annelyse Rey L. Tivis Ringer, [place] Office number [phone] Pager [phone]   Juan Quam Laurice 07/17/2018,2:29 PM

## 2018-07-17 NOTE — Progress Notes (Signed)
CSW aware that pt is now extubated. Per reports pt may be eligible for [facility] however has not been determined fully at this moment. CSW following to assess pt once more alert and oriented in the event that pt meets criteria to return to [place].      Virgie Dad Sonnet Rizor, MSW, [place] [facility] Clinical Social Worker [phone]

## 2018-07-17 NOTE — Evaluation (Signed)
Physical Therapy Evaluation Patient Details Name: Blake [name] MRN: [id number] DOB: 1941-01-01 Today's Date: 07/17/2018   History of Present Illness  77 y.o. male admitted on 07/06/18 from [facility] due to cough and SOB.  Dx with L Lower lobe HCAP and intubated for acuty hypercarbic respiratory failure from 07/07/18-07/16/18.  He had recent admission 06/25/18 through 06/29/18 for seizures and EtOH withdrawal and was discharged to [facility] for rehab.  Pt with significant PMH of stroke, HTN, ETOH abuse, COPD, insomnia, and (+) smoker.  Clinical Impression  Pt was attempting to get up OOB when PT entered the room.  He was standing EOB with prevalon boots donned and wrapped up in all of his cords including his posey belt.  Pt is very unsteady and weak in standing with no awareness that this is the case. He was ambulatory a month ago with PT when he was last admitted to [facility].  Right now, pt would require two people for safe gait, so I will bring a second person and a portable O2 tank tomorrow to re-attempt.   PT to follow acutely for deficits listed below.      Follow Up Recommendations SNF;Supervision/Assistance - 24 hour(from [facility] per chart)    Equipment Recommendations  Rolling walker with 5" wheels    Recommendations for Other Services   NA    Precautions / Restrictions Precautions Precautions: Fall Restrictions Weight Bearing Restrictions: No      Mobility  Bed Mobility Overal bed mobility: Needs Assistance Bed Mobility: Supine to Sit;Sit to Supine     Supine to sit: Min assist;HOB elevated Sit to supine: Min assist;HOB elevated   General bed mobility comments: Min assist to safely transition to EOB and back into bed.    Transfers Overall transfer level: Needs assistance Equipment used: None Transfers: Sit to/from Stand Sit to Stand: Mod assist;From elevated surface         General transfer comment: Mod assist from elevated bed to stand.  Posterior preference and  weak and shaking legs.   Ambulation/Gait             General Gait Details: NT as would be safer at this time with second person to assist.  Will re-attempt tomorrow with RW, O2 and second person.          Balance Overall balance assessment: Needs assistance Sitting-balance support: Feet supported;Bilateral upper extremity supported Sitting balance-Leahy Scale: Poor Sitting balance - Comments: min assist Postural control: Posterior lean Standing balance support: Bilateral upper extremity supported Standing balance-Leahy Scale: Poor Standing balance comment: needs external assist to stand.                              Pertinent Vitals/Pain Pain Assessment: No/denies pain    Home Living Family/patient expects to be discharged to:: Skilled nursing facility(from [facility])                            Hand Dominance   Dominant Hand: Right    Extremity/Trunk Assessment   Upper Extremity Assessment Upper Extremity Assessment: Generalized weakness    Lower Extremity Assessment Lower Extremity Assessment: Generalized weakness(able to lift both legs in bed against gravity)    Cervical / Trunk Assessment Cervical / Trunk Assessment: Normal  Communication   Communication: No difficulties  Cognition Arousal/Alertness: Awake/alert Behavior During Therapy: WFL for tasks assessed/performed Overall Cognitive Status: Impaired/Different from baseline Area of Impairment:  Orientation;Memory;Following commands;Safety/judgement;Awareness;Attention                 Orientation Level: Disoriented to;Place;Time;Situation Current Attention Level: Sustained Memory: Decreased recall of precautions;Decreased [place]-term memory Following Commands: Follows one step commands consistently Safety/Judgement: Decreased awareness of safety;Decreased awareness of deficits Awareness: Intellectual   General Comments: Pt trying to climb out of bed despite restraints and  multiple lines.  He cannot tell me where he is or that he was recently at [facility] fo rehab.  He could not tell me if he used a RW or if he used O2 before admission.  He is a very high fall risk as he is weak and trying to get up on his own.       General Comments General comments (skin integrity, edema, etc.): VSS EOB with O2 [place].  BP lower in supine, but came up appropriately EOB.         Assessment/Plan    PT Assessment Patient needs continued PT services  PT Problem List Decreased strength;Decreased activity tolerance;Decreased mobility;Decreased balance;Decreased cognition;Decreased knowledge of use of DME;Cardiopulmonary status limiting activity       PT Treatment Interventions DME instruction;Gait training;Functional mobility training;Therapeutic activities;Therapeutic exercise;Balance training;Patient/family education;Cognitive remediation    PT Goals (Current goals can be found in the Care Plan section)  Acute Rehab PT Goals Patient Stated Goal: to get up PT Goal Formulation: With patient Time For Goal Achievement: 07/31/18 Potential to Achieve Goals: Good    Frequency Min 2X/week           AM-PAC PT "6 Clicks" Daily Activity  Outcome Measure Difficulty turning over in bed (including adjusting bedclothes, sheets and blankets)?: Unable Difficulty moving from lying on back to sitting on the side of the bed? : Unable Difficulty sitting down on and standing up from a chair with arms (e.g., wheelchair, bedside commode, etc,.)?: Unable Help needed moving to and from a bed to chair (including a wheelchair)?: A Lot Help needed walking in hospital room?: A Lot Help needed climbing 3-5 steps with a railing? : Total 6 Click Score: 8    End of Session Equipment Utilized During Treatment: Gait belt;Oxygen(2.5 L O2 [place]) Activity Tolerance: Patient tolerated treatment well Patient left: in bed;with bed alarm set;with call bell/phone within reach;with restraints reapplied(posey  belt) Nurse Communication: Mobility status PT Visit Diagnosis: Unsteadiness on feet (R26.81);Muscle weakness (generalized) (M62.81);Difficulty in walking, not elsewhere classified (R26.2)    Time: 7116-5790 PT Time Calculation (min) (ACUTE ONLY): 23 min   Charges:           Wells Guiles B. Kloe Oates, PT, DPT  Acute Rehabilitation #[phone] pager #[phone] office   PT Evaluation $PT Eval Moderate Complexity: 1 Mod PT Treatments $Therapeutic Activity: 8-22 mins        07/17/2018, 5:08 PM

## 2018-07-17 NOTE — Clinical Social Work Note (Signed)
Clinical Social Work Assessment  Patient Details  Name: Blake [name] MRN: [id number] Date of Birth: 1940/10/23  Date of referral:  07/17/18               Reason for consult:  Facility Placement, Discharge Planning                Permission sought to share information with:  Other Permission granted to share information::  No  Name::     none given  Agency::   none give  Relationship::  none given  Contact Information:  none given  Housing/Transportation Living arrangements for the past 2 months:  Skilled Nursing Facility([facility]. ) Source of Information:  Patient Patient Interpreter Needed:  None Criminal Activity/Legal Involvement Pertinent to Current Situation/Hospitalization:  No - Comment as needed Significant Relationships:  None Lives with:  Facility Resident Do you feel safe going back to the place where you live?  Yes Need for family participation in patient care:  Yes (Comment)  Care giving concerns: CSW following pt for further discharge needs back to SNF. CSW aware that pt currently being recommended for LTAC.   Social Worker assessment / plan:  CSW spoke with pt at bedside as well as with facility admission. CSW was informed by both that pt is from [facility] where pt went for rehab. CSW was advised that pt had only been at the facility for a week and then began to have difficulties medically at the facility. CSW was advised by facility that pt can return to the facility at the time of discharge if no other level of care is needed. Pt agreeable to return to facility as needed.  During this assessment pt coughed and answered questions approprietly.  Employment status:  Retired Forensic scientist:  Medicare PT Recommendations:  LTAC, [place] / Referral to community resources:  [place]  Patient/Family's Response to care:  Pt's response to care appeared to be understanding and agreeable to plan of care at this time.    Patient/Family's Understanding of and Emotional Response to Diagnosis, Current Treatment, and Prognosis:  At this time no further questions or concerns have been presented to CSW.   Emotional Assessment Appearance:  Appears stated age Attitude/Demeanor/Rapport:  Engaged Affect (typically observed):  Pleasant Orientation:  Oriented to Self, Oriented to Situation, Oriented to Place, Oriented to  Time Alcohol / Substance use:  Not Applicable Psych involvement (Current and /or in the community):  No (Comment)  Discharge Needs  Concerns to be addressed:  Basic Needs Readmission within the last 30 days:  Yes Current discharge risk:  Dependent with Mobility Barriers to Discharge:  Continued Medical Work up   [employer], [place] 07/17/2018, 10:53 AM

## 2018-07-17 NOTE — Care Management (Signed)
Pt is now extubated on 3 liters [place].  CM discussed discharge plan with collaborative team - discharge plan will be SNF - pt does not require LTACH level of care at this time

## 2018-07-17 NOTE — Evaluation (Signed)
Clinical/Bedside Swallow Evaluation Patient Details  Name: Blake [name] MRN: [id number] Date of Birth: Jan 13, 1941  Today's Date: 07/17/2018 Time: SLP Start Time (ACUTE ONLY): 51 SLP Stop Time (ACUTE ONLY): 0945 SLP Time Calculation (min) (ACUTE ONLY): 25 min  Past Medical History:  Past Medical History:  Diagnosis Date  . Chronic insomnia   . Cigarette smoker   . COPD (chronic obstructive pulmonary disease) ([place])   . Diverticulosis of colon   . Elevated prostate specific antigen (PSA)   . ETOH abuse   . GERD (gastroesophageal reflux disease)   . Hx of colonic polyps   . Hyperlipidemia   . Hypertension   . Pulmonary nodule   . Shortness of breath   . Stroke [facility])    Past Surgical History:  Past Surgical History:  Procedure Laterality Date  . COLONOSCOPY    . needle biopsy RUL nodule  01/2009   benign  . TONSILLECTOMY  1947  . VASECTOMY     HPI:  77 year old smoker, nursing home resident admitted with left lower lobe HCAP and intubated for acute hypercarbic respiratory failure. He had recent admission 06/25/18 through 06/29/18 for seizures and EtOH withdrawal and was discharged to [facility] for rehab    Assessment / Plan / Recommendation Clinical Impression  Patient presents with mild to moderate oropharyngeal dysphagia. Patient was extubated 24 hours ago and was intubated for 9 days. At bedside patient tolerate ice chips with no evidence of overt aspiration, however with sequencial sips of water and straw sips a delayed throat clear (1/2 trials) and a delayed cough X1. Pt tolerated puree with no overt aspiration and good bolus control. He had prolonged mastication (edematous, no dentures present) of cracker, but with liquid wash and multiple swallows effectively cleared with no overt aspiration. Patient's voice was clear and respiratory status was stable throughout evaluation with O2 sats remaining 89%. Due to prolonged intubation a MBS is recommended to rule out silent  aspiration. Patient to remain NPO with ice chips only with nurse.  SLP Visit Diagnosis: Dysphagia, oropharyngeal phase (R13.12)    Aspiration Risk  Moderate aspiration risk    Diet Recommendation NPO(ice chips with nurse after good oral care)        Other  Recommendations Oral Care Recommendations: Oral care BID   Follow up Recommendation   MBS                 Prognosis Prognosis for Safe Diet Advancement: Good      Swallow Study   General Date of Onset: 07/06/18 HPI: 77 year old smoker, nursing home resident admitted with left lower lobe HCAP and intubated for acute hypercarbic respiratory failure. He had recent admission 06/25/18 through 06/29/18 for seizures and EtOH withdrawal and was discharged to [facility] for rehab  Type of Study: Bedside Swallow Evaluation Previous Swallow Assessment: 06/26/18 Diet Prior to this Study: NPO Temperature Spikes Noted: No Respiratory Status: Nasal cannula History of Recent Intubation: Yes Date extubated: 07/16/18 Behavior/Cognition: Alert;Cooperative;Pleasant mood Oral Cavity Assessment: Within Functional Limits Oral Care Completed by SLP: No Oral Cavity - Dentition: Edentulous Vision: Functional for self-feeding Self-Feeding Abilities: Needs assist Patient Positioning: Upright in bed Baseline Vocal Quality: Normal Volitional Cough: Strong Volitional Swallow: Able to elicit    Oral/Motor/Sensory Function Overall Oral Motor/Sensory Function: Mild impairment Facial ROM: Within Functional Limits Facial Symmetry: Within Functional Limits Facial Strength: Within Functional Limits Facial Sensation: Within Functional Limits Lingual ROM: Within Functional Limits Lingual Symmetry: Within Functional Limits Lingual Strength: Within Functional Limits Lingual Sensation:  Within Functional Limits   Ice Chips Ice chips: Within functional limits Presentation: Spoon   Thin Liquid Thin Liquid: Impaired Presentation: Cup;Straw Pharyngeal  Phase  Impairments: Suspected delayed Swallow;Decreased hyoid-laryngeal movement;Throat Clearing - Delayed;Cough - Delayed    Nectar Thick Nectar Thick Liquid: Not tested   Honey Thick Honey Thick Liquid: Not tested   Puree Puree: Within functional limits Presentation: Self Fed   Solid     Solid: Impaired Presentation: Self Fed Oral Phase Impairments: Impaired mastication Oral Phase Functional Implications: Impaired mastication Pharyngeal Phase Impairments: Multiple swallows      [name] [name], MA, CCC-SLP 07/17/2018 9:55 AM

## 2018-07-17 NOTE — Progress Notes (Addendum)
NAME:  Blake [name], MRN:  [id number], DOB:  28-Aug-1941, LOS: 4 ADMISSION DATE:  07/06/2018, CONSULTATION DATE:  07/07/18 REFERRING MD:  Marlowe Sax, CHIEF COMPLAINT:  confusion   Brief History   77 y/o male presented from a SNF with HCAP intubated for acute respiratory failure with hypoxemia.    Past Medical History  Stroke, EtOH abuse, GERD, COPD  Significant Hospital Events   10/10 > admit. 10/11 > transfer to ICU for intubation. 10/15 precedex >> brady 20's on 0.6 (avoid precedex) 10/17 added seroquel /klonopin 10/19 weaned PS 5/5 x 30 mis but then tachypenic 10/20 extubated  Consults: date of consult/date signed off & final recs:  PCCM   Procedures (surgical and bedside):  ETT 10/11 > 10/20 Bronchoscopy 10/18 > thick secretions lower lobes  Significant Diagnostic Tests:  10/21 SLP evaluation> appears to have esophageal dysmotility  Micro Data:  Micro Data: Blood 10/10 > neg  Sputum 10/14 > ng resp 10/18 >>ng  Antimicrobials:  Antimicrobials:  Zosyn 10/10 >   Subjective:  Extubated Had what appeared to be slow esophageal movement on SLP evaluation  Objective   Blood pressure 116/77, pulse 72, temperature 98.2 F (36.8 C), temperature source Oral, resp. rate (!) 22, height 5\' 5"  (1.651 m), weight 64.9 kg, SpO2 98 %.        Intake/Output Summary (Last 24 hours) at 07/17/2018 1556 Last data filed at 07/17/2018 1500 Gross per 24 hour  Intake 475.54 ml  Output 2350 ml  Net -1874.46 ml   Filed Weights   07/15/18 0435 07/16/18 0455 07/17/18 0341  Weight: 76.4 kg 76.4 kg 64.9 kg    Examination: General:  Resting comfortably in bed HENT: NCAT OP clear PULM: Rhonchi, coughing, normal effort CV: RRR, no mgr GI: BS+, soft, nontender MSK: normal bulk and tone Neuro: awake, alert, no distress, MAEW   Resolved Hospital Problem list   Septic shock Acute respiratory failure   Assessment & Plan:  Aspiration pneumonia:  > monitor off of antibiotics >  remains aspiration risk so NPO > consider NG tube  Esophageal dysmotility > would follow up bedside eval by SLP on 10/22, if still a problem then check DG esophagus (esophagram)  COPD > wean off solumedrol this wee > continue scheduled duoneb  Acute pulmonary edema > continue diuresis  History of stroke and seizures > keppra  Acute metabolic encephalopathy > continue seroquel > prn haldol > clonazepam  EtOH abuse > thiamine/folate  Disposition / Summary of Today's Plan 07/17/18   To SDU, TRH     Diet: NPO Pain/Anxiety/Delirium protocol (if indicated): n/a VAP protocol (if indicated): n/a DVT prophylaxis: lovenox GI prophylaxis: famotidine Hyperglycemia protocol: monitor glucose Mobility: PT consult Code Status: full Family Communication: none bedside  Labs   CBC: Recent Labs  Lab 07/13/18 0341 07/14/18 0319 07/15/18 0308 07/16/18 0402 07/17/18 0331  WBC 11.9* 11.9* 12.0* 12.8* 12.5*  HGB 11.5* 11.6* 11.8* 12.0* 11.8*  HCT 36.4* 37.1* 37.9* 38.8* 37.5*  MCV 106.4* 106.9* 108.0* 108.7* 106.5*  PLT 426* 455* 460* 496* 511*    Basic Metabolic Panel: Recent Labs  Lab 07/12/18 0400 07/13/18 0341 07/14/18 0319 07/15/18 0308 07/16/18 0402 07/17/18 0331  NA 144 144 143 144 144 146*  K 3.8 3.4* 3.7 3.4* 3.3* 3.2*  CL 108 104 103 102 101 104  CO2 31 33* 33* 35* 38* 34*  GLUCOSE 210* 164* 180* 179* 151* 122*  BUN 21 18 20  24* 23 26*  CREATININE 0.77 0.71 0.78 0.80  0.68 0.89  CALCIUM 8.2* 8.1* 8.1* 8.0* 8.5* 8.7*  MG 2.2 2.1 2.3 2.1  --  2.3  PHOS 3.1 4.0 3.9 3.4  --  3.2   GFR: Estimated Creatinine Clearance: 60.5 mL/min (by C-G formula based on SCr of 0.89 mg/dL). Recent Labs  Lab 07/13/18 0341 07/14/18 0319 07/15/18 0308 07/16/18 0402 07/17/18 0331  PROCALCITON 0.12 0.12 0.10  --   --   WBC 11.9* 11.9* 12.0* 12.8* 12.5*    Liver Function Tests: No results for input(s): AST, ALT, ALKPHOS, BILITOT, PROT, ALBUMIN in the last 168 hours. No  results for input(s): LIPASE, AMYLASE in the last 168 hours. No results for input(s): AMMONIA in the last 168 hours.  ABG    Component Value Date/Time   PHART 7.315 (L) 07/08/2018 0445   PCO2ART 38.9 07/08/2018 0445   PO2ART 87.0 07/08/2018 0445   HCO3 19.7 (L) 07/08/2018 0445   TCO2 21 (L) 07/08/2018 0445   ACIDBASEDEF 6.0 (H) 07/08/2018 0445   O2SAT 96.0 07/08/2018 0445     Coagulation Profile: No results for input(s): INR, PROTIME in the last 168 hours.  Cardiac Enzymes: No results for input(s): CKTOTAL, CKMB, CKMBINDEX, TROPONINI in the last 168 hours.  HbA1C: Hgb A1c MFr Bld  Date/Time Value Ref Range Status  08/27/2016 10:20 AM 4.5 (L) 4.6 - 6.5 % Final    Comment:    Glycemic Control Guidelines for People with Diabetes:Non Diabetic:  <6%Goal of Therapy: <7%Additional Action Suggested:  >8%   10/15/2014 03:17 AM 4.7 <5.7 % Final    Comment:    (NOTE)                                                                       According to the ADA Clinical Practice Recommendations for 2011, when HbA1c is used as a screening test:  >=6.5%   Diagnostic of Diabetes Mellitus           (if abnormal result is confirmed) 5.7-6.4%   Increased risk of developing Diabetes Mellitus References:Diagnosis and Classification of Diabetes Mellitus,Diabetes VQMG,8676,19(JKDTO 1):S62-S69 and Standards of Medical Care in         Diabetes - 2011,Diabetes IZTI,4580,99 (Suppl 1):S11-S61.     CBG: Recent Labs  Lab 07/16/18 2350 07/17/18 0336 07/17/18 0732 07/17/18 1120 07/17/18 1507  GLUCAP 112* 109* 122* 142* 174*     Roselie Awkward, MD [place] Pager: [phone] Cell: [phone] After 3pm or if no response, call [phone]

## 2018-07-18 DIAGNOSIS — E440 Moderate protein-calorie malnutrition: Secondary | ICD-10-CM

## 2018-07-18 DIAGNOSIS — G40909 Epilepsy, unspecified, not intractable, without status epilepticus: Secondary | ICD-10-CM

## 2018-07-18 LAB — GLUCOSE, CAPILLARY
GLUCOSE-CAPILLARY: 91 mg/dL (ref 70–99)
GLUCOSE-CAPILLARY: 108 mg/dL — AB (ref 70–99)
GLUCOSE-CAPILLARY: 169 mg/dL — AB (ref 70–99)
Glucose-Capillary: 129 mg/dL — ABNORMAL HIGH (ref 70–99)
Glucose-Capillary: 97 mg/dL (ref 70–99)

## 2018-07-18 LAB — BASIC METABOLIC PANEL
ANION GAP: 8 (ref 5–15)
BUN: 24 mg/dL — ABNORMAL HIGH (ref 8–23)
CO2: 29 mmol/L (ref 22–32)
Calcium: 8.7 mg/dL — ABNORMAL LOW (ref 8.9–10.3)
Chloride: 106 mmol/L (ref 98–111)
Creatinine, Ser: 0.78 mg/dL (ref 0.61–1.24)
Glucose, Bld: 107 mg/dL — ABNORMAL HIGH (ref 70–99)
POTASSIUM: 3.1 mmol/L — AB (ref 3.5–5.1)
Sodium: 143 mmol/L (ref 135–145)

## 2018-07-18 MED ORDER — GEMFIBROZIL 600 MG PO TABS
600.0000 mg | ORAL_TABLET | Freq: Two times a day (BID) | ORAL | Status: DC
  Administered 2018-07-18 – 2018-07-24 (×12): 600 mg via ORAL
  Filled 2018-07-18 (×14): qty 1

## 2018-07-18 MED ORDER — LEVETIRACETAM 500 MG PO TABS
500.0000 mg | ORAL_TABLET | Freq: Two times a day (BID) | ORAL | Status: DC
  Administered 2018-07-18 – 2018-07-24 (×13): 500 mg via ORAL
  Filled 2018-07-18 (×13): qty 1

## 2018-07-18 MED ORDER — QUETIAPINE FUMARATE 25 MG PO TABS
100.0000 mg | ORAL_TABLET | Freq: Two times a day (BID) | ORAL | Status: DC
  Administered 2018-07-18 – 2018-07-21 (×6): 100 mg via ORAL
  Filled 2018-07-18 (×6): qty 4

## 2018-07-18 MED ORDER — CLONAZEPAM 0.125 MG PO TBDP
0.5000 mg | ORAL_TABLET | Freq: Every day | ORAL | Status: DC
  Administered 2018-07-19 – 2018-07-24 (×5): 0.5 mg via ORAL
  Filled 2018-07-18 (×8): qty 4

## 2018-07-18 MED ORDER — VITAMIN B-1 100 MG PO TABS
100.0000 mg | ORAL_TABLET | Freq: Every day | ORAL | Status: DC
  Administered 2018-07-19 – 2018-07-24 (×6): 100 mg via ORAL
  Filled 2018-07-18 (×6): qty 1

## 2018-07-18 MED ORDER — ASPIRIN 81 MG PO CHEW
81.0000 mg | CHEWABLE_TABLET | Freq: Every day | ORAL | Status: DC
  Administered 2018-07-19 – 2018-07-24 (×6): 81 mg via ORAL
  Filled 2018-07-18 (×6): qty 1

## 2018-07-18 MED ORDER — FOLIC ACID 1 MG PO TABS
1.0000 mg | ORAL_TABLET | Freq: Every day | ORAL | Status: DC
  Administered 2018-07-19 – 2018-07-24 (×6): 1 mg via ORAL
  Filled 2018-07-18 (×6): qty 1

## 2018-07-18 MED ORDER — PREDNISONE 20 MG PO TABS
20.0000 mg | ORAL_TABLET | Freq: Every day | ORAL | Status: DC
  Administered 2018-07-19 – 2018-07-20 (×2): 20 mg via ORAL
  Filled 2018-07-18 (×2): qty 1

## 2018-07-18 MED ORDER — PANTOPRAZOLE SODIUM 40 MG PO TBEC
40.0000 mg | DELAYED_RELEASE_TABLET | Freq: Every day | ORAL | Status: DC
  Administered 2018-07-18 – 2018-07-24 (×7): 40 mg via ORAL
  Filled 2018-07-18 (×7): qty 1

## 2018-07-18 NOTE — Progress Notes (Signed)
Nutrition Follow-up  DOCUMENTATION CODES:   Non-severe (moderate) malnutrition in context of chronic illness  INTERVENTION:    Diet advancement as able per SLP.  RD to add appropriate PO supplements when diet advanced.  NUTRITION DIAGNOSIS:   Moderate Malnutrition related to chronic illness(COPD) as evidenced by moderate fat depletion, severe fat depletion, mild muscle depletion, moderate muscle depletion.  Ongoing  GOAL:   Patient will meet greater than or equal to 90% of their needs  Unmet  MONITOR:   Diet advancement, PO intake  ASSESSMENT:   77 yo male with PMH of COPD, pulmonary nodule, smoker, seizures, HTN, HLD, GERD, alcohol abuse, stroke who was admitted on 10/10 with HCAP. Respiratory status declined and required transfer to the ICU for intubation on 10/11.  Patient was extubated on 10/20. TF off since extubation, OGT was removed.   SLP following for dysphagia that is suspected to be chronic. S/P MBS 10/21. Currently recommended to have sips/chips and medications with puree. Per RN, patient is doing well with pills in applesauce and sips/chips.   Per review of MD progress notes, patient would likely not do well with alternate means of nutrition. SLP to advance diet as tolerated.  Labs reviewed. Medications reviewed. I/O +5 L since admission. Current weight is 2.9 kg below admission weight.   Diet Order:   Diet Order            Diet NPO time specified  Diet effective now              EDUCATION NEEDS:   No education needs have been identified at this time  Skin:  Skin Assessment: Reviewed RN Assessment  Last BM:  10/21 (type 5)  Height:   Ht Readings from Last 3 Encounters:  07/07/18 5\' 5"  (1.651 m)  07/06/18 5\' 11"  (1.803 m)  07/04/18 5\' 11"  (1.803 m)    Weight:   Wt Readings from Last 1 Encounters:  07/17/18 64.9 kg    Estimated Nutritional Needs:   Kcal:  1700-1900  Protein:  80-90 gm  Fluid:  1.7-1.9 L    Molli Barrows, RD, LDN, [place] Pager [phone] After Hours Pager [phone]

## 2018-07-18 NOTE — Progress Notes (Signed)
Physical Therapy Treatment Patient Details Name: Blake [name] MRN: [id number] DOB: Nov 08, 1940 Today's Date: 07/18/2018    History of Present Illness 77 y.o. male admitted on 07/06/18 from [facility] due to cough and SOB.  Dx with L Lower lobe HCAP and intubated for acuty hypercarbic respiratory failure from 07/07/18-07/16/18.  He had recent admission 06/25/18 through 06/29/18 for seizures and EtOH withdrawal and was discharged to [facility] for rehab.  Pt with significant PMH of stroke, HTN, ETOH abuse, COPD, insomnia, and (+) smoker.    PT Comments    Pt was able to walk a [place] distance both with and without RW and both were mod assist of two people.  Pt's O2 sats dropped to 87% on 4L O2 [place] during gait, increased to 6L briefly and pt came up to 94% with pursed lip breathing and a seated rest break.  O2 returned to 4 L at end of session.  PT will continue to follow acutely for safe mobility progression  Follow Up Recommendations  SNF;Other (comment)(from [place] per chart)     Equipment Recommendations  Rolling walker with 5" wheels    Recommendations for Other Services   NA     Precautions / Restrictions Precautions Precautions: Fall;Other (comment) Precaution Comments: swallow precautions, monitor O2 sats Restrictions Weight Bearing Restrictions: No    Mobility  Bed Mobility Overal bed mobility: Needs Assistance Bed Mobility: Supine to Sit     Supine to sit: Min assist;HOB elevated     General bed mobility comments: Min assist to support trunk to get to sitting EOB.  Pt able to move both legs over.   Transfers Overall transfer level: Needs assistance Equipment used: 2 person hand held assist Transfers: Sit to/from Stand Sit to Stand: Mod assist;+2 physical assistance         General transfer comment: Mod assist to stand EOB with two person hand held assist.  Pt's feet sliding forward despite having gripper socks donned.  Feet blocked for safety,  Stood from  transport chair with RW and that went better, heavy min assist.    Ambulation/Gait Ambulation/Gait assistance: Mod assist;+2 physical assistance Gait Distance (Feet): 10 Feet Assistive device: Rolling walker (2 wheeled);2 person hand held assist Gait Pattern/deviations: Shuffle;Leaning posteriorly     General Gait Details: Pt with staggering, shuffling gait pattern with posterior lean over flexed knees.  Mildly better with RW than without RW, but still tries to sit prematurely .        Balance Overall balance assessment: Needs assistance Sitting-balance support: Feet supported;Bilateral upper extremity supported Sitting balance-Leahy Scale: Poor Sitting balance - Comments: min assist EOB due to posterior lean Postural control: Posterior lean Standing balance support: Bilateral upper extremity supported Standing balance-Leahy Scale: Poor Standing balance comment: mod assist with and without RW to support trunk for balance and prevent feet from sliding.                              Cognition Arousal/Alertness: Awake/alert Behavior During Therapy: WFL for tasks assessed/performed Overall Cognitive Status: Impaired/Different from baseline Area of Impairment: Memory;Following commands;Safety/judgement;Awareness;Problem solving                     Memory: Decreased [place]-term memory(did not recall me from yesterday) Following Commands: Follows one step commands consistently Safety/Judgement: Decreased awareness of safety;Decreased awareness of deficits Awareness: Intellectual Problem Solving: Difficulty sequencing;Requires verbal cues;Requires tactile cues General Comments: Pt continues to be impulsive,  trying to get up OOB on his own despite balance and strength deficits.  Safety sitter in room now.           General Comments General comments (skin integrity, edema, etc.): Pt's O2 sats were 87% on 4L O2 [place] during mobility.       Pertinent Vitals/Pain Pain  Assessment: No/denies pain Faces Pain Scale: No hurt           PT Goals (current goals can now be found in the care plan section) Acute Rehab PT Goals Patient Stated Goal: to get out of here Progress towards PT goals: Progressing toward goals    Frequency    Min 2X/week      PT Plan Current plan remains appropriate       AM-PAC PT "6 Clicks" Daily Activity  Outcome Measure  Difficulty turning over in bed (including adjusting bedclothes, sheets and blankets)?: Unable Difficulty moving from lying on back to sitting on the side of the bed? : Unable Difficulty sitting down on and standing up from a chair with arms (e.g., wheelchair, bedside commode, etc,.)?: Unable Help needed moving to and from a bed to chair (including a wheelchair)?: A Lot Help needed walking in hospital room?: A Lot Help needed climbing 3-5 steps with a railing? : Total 6 Click Score: 8    End of Session Equipment Utilized During Treatment: Gait belt;Oxygen(4-6L O2 [place]) Activity Tolerance: Patient limited by fatigue Patient left: in bed;with bed alarm set;with call bell/phone within reach;with nursing/sitter in room Nurse Communication: Mobility status PT Visit Diagnosis: Unsteadiness on feet (R26.81);Muscle weakness (generalized) (M62.81);Difficulty in walking, not elsewhere classified (R26.2)     Time: 0737-1062 PT Time Calculation (min) (ACUTE ONLY): 34 min  Charges:  $Gait Training: 8-22 mins $Therapeutic Activity: 8-22 mins                    Havilah Topor B. Maika Kaczmarek, PT, DPT  Acute Rehabilitation [phone] pager #[phone] office   07/18/2018, 4:58 PM

## 2018-07-18 NOTE — Progress Notes (Signed)
Pt monitored for 1 hr out of restraints, pt calm, cooperative and following commands restraints discontinued.

## 2018-07-18 NOTE — Progress Notes (Signed)
Pt arrived to unit with out soft waist restraint pt calm and following commands and listening to staff.

## 2018-07-18 NOTE — Progress Notes (Signed)
[place] TEAM 1 - Stepdown/ICU TEAM  Blake [name]  GLO:[id number] DOB: Mar 29, 1941 DOA: 07/06/2018 PCP: Hendricks Limes, MD    Brief Narrative:  77 y/o male w/ a hx of CVA, EtOH abuse, GERD, and COPD who presented from a SNF with HCAP intubated for acute respiratory failure with hypoxemia.    Significant Events: 10/10 admit 10/11 transfer to ICU for intubation 10/15 precedex > brady 20's on 0.6 (avoid precedex) 10/17 added seroquel / klonopin 10/18 bronchoscopy - thick secretions lower lobes  10/19 weaned PS 5/5 x 30 mis but then tachypenic 10/20 extubated  Subjective: Pt is awake and conversant. He appears mildly confused, and is agitated at times. He frequently becomes angry w/ the staff and resists assistance at times. He denies cp, n/v, or abdom pain. His RN reports that he is tolerating applesauce w/ his meds w/o trouble.   Assessment & Plan:  Aspiration pneumonia remains aspiration risk due to what appears to be signif esophageal dysphagia - now off abx - no clinical signs of ongoing infection   Esophageal dysmotility check DG esophagus to r/o an easily treatable issue such as a web - would likely not do well w/ alternate means of nutrition - will likely have to accept whatever diet SLP feels is best tolerated  COPD wean off solumedrol - continue scheduled duoneb  Acute pulmonary edema Wean O2 as able   History of stroke   Hx of Seizures Likely related to EtOH withdrawal   Acute metabolic encephalopathy continue seroquel + prn haldol  EtOH abuse Thiamine/folate continue   DVT prophylaxis: lovenox  Code Status: FULL CODE Family Communication: no family present at time of exam  Disposition Plan: SDU due to need for high RN involvement   Consultants:  PCCM  Antimicrobials:  Zosyn 10/10 > 10/20  Objective: Blood pressure 107/67, pulse 69, temperature 98.5 F (36.9 C), temperature source Oral, resp. rate (!) 25, height 5\' 5"  (1.651 m), weight 64.9  kg, SpO2 93 %.  Intake/Output Summary (Last 24 hours) at 07/18/2018 0919 Last data filed at 07/18/2018 0600 Gross per 24 hour  Intake 892.24 ml  Output 2310 ml  Net -1417.76 ml   Filed Weights   07/15/18 0435 07/16/18 0455 07/17/18 0341  Weight: 76.4 kg 76.4 kg 64.9 kg    Examination: General: No acute respiratory distress Lungs: mild bibasilar crackles - no wheezing  Cardiovascular: Regular rate and rhythm without murmur gallop or rub normal S1 and S2 Abdomen: Nontender, nondistended, soft, bowel sounds positive, no rebound, no ascites, no appreciable mass Extremities: No significant cyanosis, clubbing, or edema bilateral lower extremities  CBC: Recent Labs  Lab 07/15/18 0308 07/16/18 0402 07/17/18 0331  WBC 12.0* 12.8* 12.5*  HGB 11.8* 12.0* 11.8*  HCT 37.9* 38.8* 37.5*  MCV 108.0* 108.7* 106.5*  PLT 460* 496* 188*   Basic Metabolic Panel: Recent Labs  Lab 07/14/18 0319 07/15/18 0308 07/16/18 0402 07/17/18 0331  NA 143 144 144 146*  K 3.7 3.4* 3.3* 3.2*  CL 103 102 101 104  CO2 33* 35* 38* 34*  GLUCOSE 180* 179* 151* 122*  BUN 20 24* 23 26*  CREATININE 0.78 0.80 0.68 0.89  CALCIUM 8.1* 8.0* 8.5* 8.7*  MG 2.3 2.1  --  2.3  PHOS 3.9 3.4  --  3.2   GFR: Estimated Creatinine Clearance: 60.5 mL/min (by C-G formula based on SCr of 0.89 mg/dL).  Liver Function Tests: No results for input(s): AST, ALT, ALKPHOS, BILITOT, PROT, ALBUMIN in the last  168 hours. No results for input(s): LIPASE, AMYLASE in the last 168 hours. No results for input(s): AMMONIA in the last 168 hours.   HbA1C: Hgb A1c MFr Bld  Date/Time Value Ref Range Status  08/27/2016 10:20 AM 4.5 (L) 4.6 - 6.5 % Final    Comment:    Glycemic Control Guidelines for People with Diabetes:Non Diabetic:  <6%Goal of Therapy: <7%Additional Action Suggested:  >8%   10/15/2014 03:17 AM 4.7 <5.7 % Final    Comment:    (NOTE)                                                                       According  to the ADA Clinical Practice Recommendations for 2011, when HbA1c is used as a screening test:  >=6.5%   Diagnostic of Diabetes Mellitus           (if abnormal result is confirmed) 5.7-6.4%   Increased risk of developing Diabetes Mellitus References:Diagnosis and Classification of Diabetes Mellitus,Diabetes PRFF,6384,66(ZLDJT 1):S62-S69 and Standards of Medical Care in         Diabetes - 2011,Diabetes TSVX,7939,03 (Suppl 1):S11-S61.     CBG: Recent Labs  Lab 07/17/18 1507 07/17/18 1918 07/17/18 2342 07/18/18 0418 07/18/18 0728  GLUCAP 174* 104* 97 91 108*    Recent Results (from the past 240 hour(s))  Culture, respiratory (non-expectorated)     Status: None   Collection Time: 07/10/18 12:17 PM  Result Value Ref Range Status   Specimen Description TRACHEAL ASPIRATE  Final   Special Requests NONE  Final   Gram Stain NO WBC SEEN NO ORGANISMS SEEN   Final   Culture   Final    NO GROWTH 2 DAYS Performed at [facility], [place] [address], [place], [place] [address]    Report Status 07/12/2018 FINAL  Final  Expectorated sputum assessment w rflx to resp cult     Status: None   Collection Time: 07/14/18  3:52 PM  Result Value Ref Range Status   Specimen Description SPUTUM  Final   Special Requests NONE  Final   Sputum evaluation   Final    THIS SPECIMEN IS ACCEPTABLE FOR SPUTUM CULTURE Performed at [facility], [place] [address], [place], [place] [address]    Report Status 07/14/2018 FINAL  Final  Culture, respiratory     Status: None   Collection Time: 07/14/18  3:52 PM  Result Value Ref Range Status   Specimen Description SPUTUM  Final   Special Requests NONE Reflexed from [id number]  Final   Gram Stain   Final    ABUNDANT WBC PRESENT, PREDOMINANTLY PMN NO ORGANISMS SEEN Performed at [facility], [place] [address] [place], [place] [address]    Culture RARE CANDIDA ALBICANS  Final   Report Status 07/17/2018 FINAL  Final     Scheduled Meds: . aspirin  81 mg  Per Tube Daily  . Chlorhexidine Gluconate Cloth  6 each Topical Daily  . cholecalciferol  1,000 Units Oral Daily  . clonazepam  0.5 mg Per Tube Daily  . enoxaparin (LOVENOX) injection  40 mg Subcutaneous Daily  . fentaNYL (SUBLIMAZE) injection  100 mcg Intravenous Once  . folic acid  1 mg Per Tube Daily  .  furosemide  40 mg Intravenous Q12H  . gemfibrozil  600 mg Per Tube BID AC  . insulin aspart  0-9 Units Subcutaneous Q4H  . ipratropium-albuterol  3 mL Nebulization TID  . mouth rinse  15 mL Mouth Rinse BID  . methylPREDNISolone (SOLU-MEDROL) injection  40 mg Intravenous Daily  . nicotine  21 mg Transdermal Daily  . pantoprazole sodium  40 mg Per Tube Daily  . QUEtiapine  100 mg Per Tube BID  . sodium chloride flush  10-40 mL Intracatheter Q12H  . thiamine  100 mg Per Tube Daily     LOS: 12 days   Cherene Altes, MD [facility] Office  [phone] Pager - Text Page per Shea Evans  If 7PM-7AM, please contact night-coverage per Amion 07/18/2018, 9:19 AM

## 2018-07-18 NOTE — Progress Notes (Signed)
  Speech Language Pathology Treatment: Dysphagia  Patient Details Name: Blake [name] MRN: [id number] DOB: 1940-12-16 Today's Date: 07/18/2018 Time: 2094-7096 SLP Time Calculation (min) (ACUTE ONLY): 12 min  Assessment / Plan / Recommendation Clinical Impression  RN reports good tolerance of meds in puree, ice chips so far, having just received meds prior to SLP arrival. SLP offered trials of purees and thin liquids with strong coughing that followed each bolus administration. This persisted despite Mod cues for smaller bites and then even SLP-administered boluses for further regulation of size, pacing. Question if he is still not ready for larger volumes of intake. Would continue with meds crushed in puree, sips and chips intermittently with good oral hygiene. If symptoms persist, may benefit from esophageal w/u and/or GI consult.   HPI HPI: 77 year old smoker, nursing home resident admitted with left lower lobe HCAP and intubated for nine days for acute hypercarbic respiratory failure. He had recent admission 06/25/18 through 06/29/18 for seizures and EtOH withdrawal and was discharged to [facility] for rehab       SLP Plan  Continue with current plan of care       Recommendations  Diet recommendations: (sips and chips) Medication Administration: Crushed with puree Supervision: Full supervision/cueing for compensatory strategies Compensations: Slow rate;Small sips/bites;Follow solids with liquid Postural Changes and/or Swallow Maneuvers: Seated upright 90 degrees;Upright 30-60 min after meal                Oral Care Recommendations: Oral care QID Follow up Recommendations: Skilled Nursing facility SLP Visit Diagnosis: Dysphagia, pharyngoesophageal phase (R13.14) Plan: Continue with current plan of care       GO                Germain Osgood 07/18/2018, 1:34 PM  Germain Osgood, M.A. [place] Acute Environmental education officer [phone] Office  [phone]

## 2018-07-19 ENCOUNTER — Inpatient Hospital Stay (HOSPITAL_COMMUNITY): Payer: Medicare Other

## 2018-07-19 DIAGNOSIS — R933 Abnormal findings on diagnostic imaging of other parts of digestive tract: Secondary | ICD-10-CM

## 2018-07-19 LAB — COMPREHENSIVE METABOLIC PANEL
ALT: 16 U/L (ref 0–44)
AST: 20 U/L (ref 15–41)
Albumin: 2.5 g/dL — ABNORMAL LOW (ref 3.5–5.0)
Alkaline Phosphatase: 51 U/L (ref 38–126)
Anion gap: 10 (ref 5–15)
BILIRUBIN TOTAL: 0.7 mg/dL (ref 0.3–1.2)
BUN: 22 mg/dL (ref 8–23)
CO2: 27 mmol/L (ref 22–32)
CREATININE: 0.82 mg/dL (ref 0.61–1.24)
Calcium: 8.8 mg/dL — ABNORMAL LOW (ref 8.9–10.3)
Chloride: 105 mmol/L (ref 98–111)
GFR calc non Af Amer: >60 mL/min (ref >60)
Glucose, Bld: 87 mg/dL (ref 70–99)
Potassium: 3 mmol/L — ABNORMAL LOW (ref 3.5–5.1)
Sodium: 142 mmol/L (ref 135–145)
TOTAL PROTEIN: 6.6 g/dL (ref 6.5–8.1)

## 2018-07-19 LAB — CBC
HCT: 41.6 % (ref 39.0–52.0)
Hemoglobin: 13 g/dL (ref 13.0–17.0)
MCH: 32.4 pg (ref 26.0–34.0)
MCHC: 31.3 g/dL (ref 30.0–36.0)
MCV: 103.7 fL — ABNORMAL HIGH (ref 80.0–100.0)
NRBC: 0 % (ref 0.0–0.2)
PLATELETS: 539 10*3/uL — AB (ref 150–400)
RBC: 4.01 MIL/uL — ABNORMAL LOW (ref 4.22–5.81)
RDW: 13.3 % (ref 11.5–15.5)
WBC: 9.2 10*3/uL (ref 4.0–10.5)

## 2018-07-19 LAB — MAGNESIUM: MAGNESIUM: 2.2 mg/dL (ref 1.7–2.4)

## 2018-07-19 MED ORDER — ENOXAPARIN SODIUM 40 MG/0.4ML ~~LOC~~ SOLN
40.0000 mg | Freq: Every day | SUBCUTANEOUS | Status: DC
  Administered 2018-07-21 – 2018-07-24 (×4): 40 mg via SUBCUTANEOUS
  Filled 2018-07-19 (×4): qty 0.4

## 2018-07-19 NOTE — Progress Notes (Signed)
  Speech Language Pathology Treatment: Dysphagia  Patient Details Name: Blake [name] MRN: [id number] DOB: Feb 17, 1941 Today's Date: 07/19/2018 Time: 8811-0315 SLP Time Calculation (min) (ACUTE ONLY): 15 min  Assessment / Plan / Recommendation Clinical Impression  Results of esophagram noted, with dysmotility/tertiary contractions, but also with possible stricture and stasis of barium. Upon SLP arrival, pt was looking for his "spit cup" and promptly expectorate what appeared to be a mixture of secretions and applesauce. Pt had confirmed that he had recently had some applesauce during med administration. SLP provided cup sips of water to try to facilitate esophageal clearance. Mod-Max verbal//tactile cues were needed for slower pacing, as he can be impulsive. This produced immediate, but prolonged, hard coughing response. Question if GI involvement could be beneficial given concern for stricture. Reached out to MD to discuss. Will f/u as able.   HPI HPI: 77 year old smoker, nursing home resident admitted with left lower lobe HCAP and intubated for nine days for acute hypercarbic respiratory failure. He had recent admission 06/25/18 through 06/29/18 for seizures and EtOH withdrawal and was discharged to [facility] for [facility]hab       SLP Plan  Continue with current plan of care       Recommendations  Diet recommendations: Other(comment)(sips and chips) Liquids provided via: Cup;Straw Medication Administration: Crushed with puree Supervision: Full supervision/cueing for compensatory strategies Compensations: Slow rate;Small sips/bites;Follow solids with liquid Postural Changes and/or Swallow Maneuvers: Seated upright 90 degrees;Upright 30-60 min after meal                Oral Care Recommendations: Oral care QID Follow up Recommendations: Skilled Nursing facility SLP Visit Diagnosis: Dysphagia, pharyngoesophageal phase (R13.14) Plan: Continue with current plan of care       GO                 Germain Osgood 07/19/2018, 12:01 PM  Germain Osgood, M.A. [place] Acute Environmental education officer [phone] Office [phone]

## 2018-07-19 NOTE — Care Management Important Message (Signed)
Important Message  Patient Details  Name: Blake [name] MRN: [id number] Date of Birth: [birth year]   Medicare Important Message Given:  No    Blake [name] 07/19/2018, 1:05 PM

## 2018-07-19 NOTE — Progress Notes (Addendum)
Blake [name] - Stepdown/ICU TEAM  Blake [name]  Blake [name] DOB: 11-29-1940 DOA: 07/06/2018 PCP: Hendricks Limes, MD    Brief Narrative:  77 y/o male w/ a hx of CVA, EtOH abuse, GERD, and COPD who presented from a SNF with HCAP intubated for acute respiratory failure with hypoxemia.    Significant Events: 10/10 admit 10/11 transfer to ICU for intubation 10/15 precedex > brady 20's on 0.6 (avoid precedex) 10/17 added seroquel / klonopin 10/18 bronchoscopy - thick secretions lower lobes  10/19 weaned PS 5/5 x 30 mis but then tachypenic 10/20 extubated  Subjective: Patient pleasant today.  Has been at times agitated with staff earlier.  Says he has no complaints.  Assessment & Plan:  Aspiration pneumonia Completed antibiotics.  See below  Esophageal dysmotility/lower esophageal stricture Consulted gastroenterology to evaluate for expansion.  Patient remains an aspiration risk.  COPD Stable.  Solu-Medrol changed to prednisone.  Acute pulmonary edema Wean O2 as able   History of stroke   Hx of Seizures Likely related to EtOH withdrawal   Moderate protein calorie malnutrition: Patient meets criteria as evidenced by moderate and severe fat depletion and mild to moderate muscle depletion.  Seen by nutrition.  Once he is better able to take p.o., could look at trying to improve this.  Acute metabolic encephalopathy likely in the setting of chronic dementia continue seroquel + prn haldol.  Still gets episodes of occasional agitation  EtOH abuse Thiamine/folate continue   DVT prophylaxis: lovenox  Code Status: FULL CODE Family Communication: no family present at time of exam  Disposition Plan: Continue in stepdown due to patient's confusion, nursing time commitment  Consultants:  PCCM  Antimicrobials:  Zosyn 10/10-10/20  Objective: Blood pressure (!) 114/58, pulse 72, temperature 97.7 F (36.5 C), temperature source Oral, resp. rate (!) 23, height 5'  11" ([name].803 m), weight 64.8 kg, SpO2 98 %.  Intake/Output Summary (Last 24 hours) at 07/19/2018 1440 Last data filed at 07/19/2018 0314 Gross per 24 hour  Intake 10 ml  Output 1850 ml  Net -1840 ml   Filed Weights   07/16/18 0455 07/17/18 0341 07/18/18 1611  Weight: 76.4 kg 64.9 kg 64.8 kg    Examination: General: Oriented x2, no acute distress HEENT: Normocephalic and atraumatic, mucous membranes are slightly dry Neck: Supple, no JVD Lungs: Decreased breath sounds bibasilar Cardiovascular: Regular rate and rhythm, S1-S2 Abdomen: Soft, nontender, nondistended, hypoactive bowel sounds Extremities: No significant cyanosis, clubbing, or edema bilateral lower extremities Skin: Dry, exudative dermatitis  CBC: Recent Labs  Lab 07/16/18 0402 07/17/18 0331 07/19/18 0408  WBC 12.8* 12.5* 9.2  HGB 12.0* 11.8* 13.0  HCT 38.8* 37.5* 41.6  MCV 108.7* 106.5* 103.7*  PLT 496* 511* 573*   Basic Metabolic Panel: Recent Labs  Lab 07/14/18 0319 07/15/18 0308  07/17/18 0331 07/18/18 0945 07/19/18 0408  NA 143 144   < > 146* 143 142  K 3.7 3.4*   < > 3.2* 3.[name]* 3.0*  CL 103 102   < > 104 106 105  CO2 33* 35*   < > 34* 29 27  GLUCOSE 180* 179*   < > 122* 107* 87  BUN 20 24*   < > 26* 24* 22  CREATININE 0.78 0.80   < > 0.89 0.78 0.82  CALCIUM 8.[name]* 8.0*   < > 8.7* 8.7* 8.8*  MG 2.3 2.[name]  --  2.3  --  2.2  PHOS 3.9 3.4  --  3.2  --   --    < > =  values in this interval not displayed.   GFR: Estimated Creatinine Clearance: 69.[name] mL/min (by C-G formula based on SCr of 0.82 mg/dL).  Liver Function Tests: Recent Labs  Lab 07/19/18 0408  AST 20  ALT 16  ALKPHOS 51  BILITOT 0.7  PROT 6.6  ALBUMIN 2.5*   No results for input(s): LIPASE, AMYLASE in the last 168 hours. No results for input(s): AMMONIA in the last 168 hours.   HbA1C: Hgb A1c MFr Bld  Date/Time Value Ref Range Status  08/27/2016 10:20 AM 4.5 (L) 4.6 - 6.5 % Final    Comment:    Glycemic Control Guidelines for  People with Diabetes:Non Diabetic:  <6%Goal of Therapy: <7%Additional Action Suggested:  >8%   10/15/2014 03:17 AM 4.7 <5.7 % Final    Comment:    (NOTE)                                                                       According to the ADA Clinical Practice Recommendations for 2011, when HbA1c is used as a screening test:  >=6.5%   Diagnostic of Diabetes Mellitus           (if abnormal result is confirmed) 5.7-6.4%   Increased risk of developing Diabetes Mellitus References:Diagnosis and Classification of Diabetes Mellitus,Diabetes ZOXW,9604,54(UJWJX [name]):S62-S69 and Standards of Medical Care in         Diabetes - 2011,Diabetes BJYN,8295,62 (Suppl [name]):S11-S61.     CBG: Recent Labs  Lab 07/17/18 2342 07/18/18 0418 07/18/18 0728 07/18/18 1116 07/18/18 1526  GLUCAP 97 91 108* 129* 169*    Recent Results (from the past 240 hour(s))  Culture, respiratory (non-expectorated)     Status: None   Collection Time: 07/10/18 12:17 PM  Result Value Ref Range Status   Specimen Description TRACHEAL ASPIRATE  Final   Special Requests NONE  Final   Gram Stain NO WBC SEEN NO ORGANISMS SEEN   Final   Culture   Final    NO GROWTH 2 DAYS Performed at [facility], [place] [address], [place], [place] [address]    Report Status 07/12/2018 FINAL  Final  Expectorated sputum assessment w rflx to resp cult     Status: None   Collection Time: 07/14/18  3:52 PM  Result Value Ref Range Status   Specimen Description SPUTUM  Final   Special Requests NONE  Final   Sputum evaluation   Final    THIS SPECIMEN IS ACCEPTABLE FOR SPUTUM CULTURE Performed at [facility], [place] [address] [place], [place] [address]    Report Status 07/14/2018 FINAL  Final  Culture, respiratory     Status: None   Collection Time: 07/14/18  3:52 PM  Result Value Ref Range Status   Specimen Description SPUTUM  Final   Special Requests NONE Reflexed from [id number]  Final   Gram Stain   Final    ABUNDANT WBC  PRESENT, PREDOMINANTLY PMN NO ORGANISMS SEEN Performed at [facility], [place] [address], [place], [place] [address]    Culture RARE CANDIDA ALBICANS  Final   Report Status 07/17/2018 FINAL  Final     Scheduled Meds: . aspirin  81 mg Oral Daily  . Chlorhexidine Gluconate Cloth  6  each Topical Daily  . cholecalciferol  [name],000 Units Oral Daily  . clonazepam  0.5 mg Oral Daily  . enoxaparin (LOVENOX) injection  40 mg Subcutaneous Daily  . folic acid  [name] mg Oral Daily  . furosemide  40 mg Intravenous Q12H  . gemfibrozil  600 mg Oral BID AC  . ipratropium-albuterol  3 mL Nebulization TID  . levETIRAcetam  500 mg Oral BID  . mouth rinse  15 mL Mouth Rinse BID  . nicotine  21 mg Transdermal Daily  . pantoprazole  40 mg Oral Q1200  . predniSONE  20 mg Oral Q breakfast  . QUEtiapine  100 mg Oral BID  . sodium chloride flush  10-40 mL Intracatheter Q12H  . thiamine  100 mg Oral Daily     LOS: 13 days   Annita Brod, MD [facility]  To reach me or the doctor on call, go to: [email or web address] Password [email or web address]  If 7PM-7AM, please contact night-coverage per Amion 07/19/2018, 2:40 PM

## 2018-07-19 NOTE — Consult Note (Signed)
[facility] Consult: 3:22 PM 07/19/2018  LOS: 13 days    Referring Provider: Dr Gevena Barre  Primary Care Physician:  Hendricks Limes, MD Primary Gastroenterologist:  Dr Delfin Edis.       Reason for Consultation:  Dysphagia.     HPI: Blake [name] is a 77 y.o. male.  Hx COPD.  Hypertension.  Alcohol abuse.  Alcohol withdrawal seizures.  CVA with aphasia.  Brain atrophy and small vessel disease on recent CT scans. History of adenomatous colon polyps in 1997, 1999, 2002, 2007.  Latest colonoscopy 06/2011 by Dr. Delfin Edis at which time she removed 3 polyps ranging in size from 2 to 10 mm.  Sessile type.  Pathology tubular adenomas without high-grade dysplasia or malignancy.  Moderate sigmoid diverticulosis noted Patient says he has had upper endoscopy but he may be confusing this with bronchoscopy. There is no records of EGD in the chart.   06/25/2018 -06/29/2018 admission with recurrent alcohol withdrawal seizures.  He was deconditioned, well-nourished and had dysphagia.  SLP recommended him following a dysphagia 3 diet with advancement to regular consistent see diet in the near future.  Discharged to SNF.  Patient readmitted 07/07/18 with HCAP, hypoxia, confusion.  He was intubated on the vent from 10/11 through 10/20.  He underwent bronchoscopy 10/18.  Noting thick secretions in the lower lobes.  There is concern about aspiration leading to the pneumonia and he underwent Modified barium swallow study 10/ 23/2019 with reduced pharyngeal/base of tongue constriction leading to diffuse pharyngeal residue.  Scan of esophagus revealed barium residue throughout.  Remarkably unfortunately there was no aspiration.  Completion of the study patient regurgitated copious quantities of barium.  SLP recommended ice  chips/sips of water, crushed meds given with applesauce. Barium esophagram 07/18/2018 reveals distal esophageal stricture extending for a length of about 17 mm, appearance benign.  Significant esophageal dysmotility, tertiary contractions and barium stasis noted as well.  Pt himself is unaware of having any difficulty swallowing.  He denies nausea, dysphagia.  No spitting up.  Past Medical History:  Diagnosis Date  . Chronic insomnia   . Cigarette smoker   . COPD (chronic obstructive pulmonary disease) ([place])   . Diverticulosis of colon   . Elevated prostate specific antigen (PSA)   . ETOH abuse   . GERD (gastroesophageal reflux disease)   . Hx of colonic polyps   . Hyperlipidemia   . Hypertension   . Pulmonary nodule   . Shortness of breath   . Stroke [facility])     Past Surgical History:  Procedure Laterality Date  . COLONOSCOPY    . needle biopsy RUL nodule  01/2009   benign  . TONSILLECTOMY  1947  . VASECTOMY      Prior to Admission medications   Medication Sig Start Date End Date Taking? Authorizing Provider  aspirin 81 MG chewable tablet Chew 81 mg by mouth daily.   Yes [provider]  bisacodyl (DULCOLAX) 10 MG suppository Place 10 mg rectally once as needed (for constipation not relieved by Milk of Magnesia).  Yes [provider]  Cholecalciferol (VITAMIN D-3) 1000 units CAPS Take 1,000 Units by mouth daily.   Yes [provider]  folic acid (FOLVITE) 1 MG tablet Take 1 tablet (1 mg total) by mouth daily. 08/27/16  Yes Nche, Charlene Brooke, NP  gemfibrozil (LOPID) 600 MG tablet Take 1 tablet (600 mg total) by mouth 2 (two) times daily. 08/27/16  Yes Nche, Charlene Brooke, NP  guaiFENesin (ROBITUSSIN) 100 MG/5ML liquid Take 200 mg by mouth every 4 (four) hours as needed for cough. x48 hours   Yes [provider]  ipratropium-albuterol (DUONEB) 0.5-2.5 (3) MG/3ML SOLN Take 3 mLs by nebulization See admin instructions. Nebulize and inhale 3 ml's  into the lungs at 6 AM, 2 PM, and 10 PM for 5 days with a start date of 07/06/18 AND 3 ml's every six hours as needed for coughing   Yes [provider]  levETIRAcetam (KEPPRA) 500 MG tablet Take 1 tablet (500 mg total) by mouth 2 (two) times daily. Patient taking differently: Take 500 mg by mouth every 12 (twelve) hours.  06/29/18  Yes Thurnell Lose, MD  magnesium hydroxide (MILK OF MAGNESIA) 400 MG/5ML suspension Take 30 mLs by mouth once as needed for mild constipation.   Yes [provider]  nicotine (NICODERM CQ - DOSED IN MG/24 HOURS) 21 mg/24hr patch Place 21 mg onto the skin daily.   Yes [provider]  Nutritional Supplements (NUTRITIONAL SUPPLEMENT PLUS) LIQD Take 120 mLs by mouth See admin instructions. MedPass: Drink 120 ml's by mouth two times a day   Yes [provider]  Sodium Phosphates (RA SALINE ENEMA) 19-7 GM/118ML ENEM Place 1 enema rectally once as needed (for constipation not relieved by Dulcolax suppository and call MD if no relief from enema).   Yes [provider]  thiamine 100 MG tablet Take 1 tablet (100 mg total) by mouth daily. 08/27/16  Yes Nche, Charlene Brooke, NP  aspirin EC 81 MG tablet Take 1 tablet (81 mg total) by mouth daily. Patient not taking: Reported on 07/06/2018 08/27/16   Nche, Charlene Brooke, NP  chlordiazePOXIDE (LIBRIUM) 5 MG capsule Please dispense 18 pills - Take 1 pill three times a day for 3 days, then Take 1 pill two times a day for 3 days, then Take 1 pill once a day for 3 days and stop. Patient taking differently: Take 5 mg by mouth See admin instructions. Take 5 mg by mouth once a day for 3 days, then stop/Start: 07/09/18 & Stop: 07/12/18 06/29/18   Thurnell Lose, MD  cholecalciferol (VITAMIN D) 1000 units tablet Take 1 tablet (1,000 Units total) by mouth daily. Patient not taking: Reported on 07/06/2018 08/27/16   Nche, Charlene Brooke, NP    Scheduled Meds: . aspirin  81 mg Oral Daily  .  Chlorhexidine Gluconate Cloth  6 each Topical Daily  . cholecalciferol  1,000 Units Oral Daily  . clonazepam  0.5 mg Oral Daily  . enoxaparin (LOVENOX) injection  40 mg Subcutaneous Daily  . folic acid  1 mg Oral Daily  . furosemide  40 mg Intravenous Q12H  . gemfibrozil  600 mg Oral BID AC  . ipratropium-albuterol  3 mL Nebulization TID  . levETIRAcetam  500 mg Oral BID  . mouth rinse  15 mL Mouth Rinse BID  . nicotine  21 mg Transdermal Daily  . pantoprazole  40 mg Oral Q1200  . predniSONE  20 mg Oral Q breakfast  . QUEtiapine  100  mg Oral BID  . sodium chloride flush  10-40 mL Intracatheter Q12H  . thiamine  100 mg Oral Daily   Infusions: . sodium chloride Stopped (07/17/18 14-Jan-2046)   PRN Meds: haloperidol lactate, magnesium hydroxide, sodium chloride flush   Allergies as of 07/06/2018  . (No Known Allergies)    Family History  Problem Relation Age of Onset  . Diabetes Father   . Lung cancer Brother   . Colon cancer Neg Hx   . Esophageal cancer Neg Hx   . Stomach cancer Neg Hx     Social History   Socioeconomic History  . Marital status: Widowed    Spouse name: Mary(deceased 01/15/1999)  . Number of children: 1  . Years of education: Not on file  . Highest education level: Not on file  Occupational History  . 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 Financial resource strain: Not on file  . Food insecurity:    Worry: Not on file    Inability: Not on file  . Transportation needs:    Medical: Not on file    Non-medical: Not on file  Tobacco Use  . Smoking status: Current Every Day Smoker    Packs/day: 1.00    Years: 55.00    Pack years: 55.00    Types: Cigarettes  . Smokeless tobacco: Never Used  Substance and Sexual Activity  . Alcohol use: Yes    Alcohol/week: 24.0 standard drinks    Types: 24 Cans of beer per week    Comment: 12-24 beer daily  . Drug use: No  . Sexual activity: Never  Lifestyle  . Physical activity:      Days per week: Not on file    Minutes per session: Not on file  . Stress: Not on file  Relationships  . Social connections:    Talks on phone: Not on file    Gets together: Not on file    Attends religious service: Not on file    Active member of club or organization: Not on file    Attends meetings of clubs or organizations: Not on file    Relationship status: Not on file  . Intimate partner violence:    Fear of current or ex partner: Not on file    Emotionally abused: Not on file    Physically abused: Not on file    Forced sexual activity: Not on file  Other Topics Concern  . Not on file  Social History Narrative   1 son that died in a MVA    REVIEW OF SYSTEMS: Constitutional: Patient denies weakness.  He feels pretty good. ENT:  No nose bleeds Pulm: Denies shortness of breath.  Some cough. CV:  No palpitations, no LE edema.  No chest pain GU:  No hematuria, no frequency GI:  Per HPI Heme: No reports of unusual bleeding or bruising. Transfusions: None mentioned in the chart. Neuro:  No headaches, no peripheral tingling or numbness Derm:  No itching, no rash or sores.  Endocrine:  No sweats or chills.  No polyuria or dysuria Immunization: Reviewed his immunization record.  I do not see a flu shot for this current flu season. Travel:  None beyond local counties in last few months.    PHYSICAL EXAM: Vital signs in last 24 hours: Vitals:   07/19/18 1201 07/19/18 1433  BP: (!) 114/58   Pulse:    Resp: (!) 23   Temp: 97.7 F (36.5 C)  SpO2:  98%   Wt Readings from Last 3 Encounters:  07/18/18 64.8 kg  07/06/18 68.6 kg  07/04/18 72.9 kg    General: Thin, somewhat chronically ill looking WM who is alert, comfortable, congenial. Head: No facial asymmetry or swelling.  No signs of head trauma. Eyes: Scleral icterus.  No conjunctival pallor.  EOMI. Ears: Slight HOH. Nose: Congestion, no discharge.  Nasal cannula oxygen in place at 3 L Mouth: Oropharynx moist,  clear.  Tongue midline. Each is a little bit garbled but mostly accurate and understandable. Neck: No JVD, no masses, no thyromegaly Lungs: Crackles in the left base.  Slightly dyspneic.  No cough. Heart: Regular.  S1-S2 present.  No MRG. Abdomen: Soft.  Not tender or distended.  No HSM, masses, bruits, hernias..   Rectal: Deferred Musc/Skeltl: Kyphosis Extremities: Thin.  No edema. Neurologic: Alert.  Appropriate.  Follows commands.  Answers questions appropriately.  H slightly impaired but certainly if presents the aphasia is currently mild.  Moves all 4 limbs, no tremor.  Limb strength not tested. Skin: Ringworm type lesions on his neck. Tattoos: None observed.   Psych: Calm, pleasant, animated.  Cooperative.  Intake/Output from previous day: 10/22 0701 - 10/23 0700 In: 140 [I.V.:140] Out: 2600 [Urine:2600] Intake/Output this shift: No intake/output data recorded.  LAB RESULTS: Recent Labs    07/17/18 0331 07/19/18 0408  WBC 12.5* 9.2  HGB 11.8* 13.0  HCT 37.5* 41.6  PLT 511* 539*   BMET Lab Results  Component Value Date   NA 142 07/19/2018   NA 143 07/18/2018   NA 146 (H) 07/17/2018   K 3.0 (L) 07/19/2018   K 3.1 (L) 07/18/2018   K 3.2 (L) 07/17/2018   CL 105 07/19/2018   CL 106 07/18/2018   CL 104 07/17/2018   CO2 27 07/19/2018   CO2 29 07/18/2018   CO2 34 (H) 07/17/2018   GLUCOSE 87 07/19/2018   GLUCOSE 107 (H) 07/18/2018   GLUCOSE 122 (H) 07/17/2018   BUN 22 07/19/2018   BUN 24 (H) 07/18/2018   BUN 26 (H) 07/17/2018   CREATININE 0.82 07/19/2018   CREATININE 0.78 07/18/2018   CREATININE 0.89 07/17/2018   CALCIUM 8.8 (L) 07/19/2018   CALCIUM 8.7 (L) 07/18/2018   CALCIUM 8.7 (L) 07/17/2018   LFT Recent Labs    07/19/18 0408  PROT 6.6  ALBUMIN 2.5*  AST 20  ALT 16  ALKPHOS 51  BILITOT 0.7   PT/INR Lab Results  Component Value Date   INR 0.97 06/25/2018   INR 1.01 01/07/2018   INR 1.04 10/14/2014   Hepatitis Panel No results for  input(s): HEPBSAG, HCVAB, HEPAIGM, HEPBIGM in the last 72 hours. C-Diff No components found for: CDIFF Lipase     Component Value Date/Time   LIPASE 30 10/14/2014 1213    Drugs of Abuse     Component Value Date/Time   LABOPIA NONE DETECTED 06/25/2018 1933   COCAINSCRNUR NONE DETECTED 06/25/2018 1933   LABBENZ NONE DETECTED 06/25/2018 1933   AMPHETMU NONE DETECTED 06/25/2018 1933   THCU NONE DETECTED 06/25/2018 1933   LABBARB NONE DETECTED 06/25/2018 1933     RADIOLOGY STUDIES: Dg Esophagus  Result Date: 07/19/2018 CLINICAL DATA:  Patient underwent a swallowing function study yesterday. Noted to have significant esophageal residual. Current exam ordered for further assessment. History of stroke. EXAM: ESOPHOGRAM/BARIUM SWALLOW TECHNIQUE: Single contrast examination was performed using  thin barium. FLUOROSCOPY TIME:  Fluoroscopy Time:  1 minutes and 12 seconds Radiation Exposure Index (  if provided by the fluoroscopic device): 29 Number of Acquired Spot Images: 596 micro Gy per meter squared COMPARISON:  Prior chest radiographs. FINDINGS: Esophagus is normal in course and caliber.  No esophageal masses. There is narrowing of the distal esophagus above a small hiatal hernia. Narrowing extends for approximately 17 mm in length. This portion of the esophagus dilates to a maximum of 6-7 mm. No other a soft joint narrowing. No evidence of active inflammation or ulceration. There is significant dysmotility with diffuse tertiary contractions from the mid through the distal esophagus leading to significant barium stasis. No reflux was documented during the exam. IMPRESSION: 1. Distal esophageal stricture extending for a length of approximately 17 mm, narrowing the esophagus to approximately 6-7 mm. This has the appearance of a benign stricture. 2. Significant esophageal dysmotility with tertiary contractions and barium stasis. Electronically Signed   By: Lajean Manes M.D.   On: 07/19/2018 09:23       IMPRESSION:   *    Dysphagia.  Based on esophagram study, he has both esophageal dysmotility as well as likely esophageal stricture.  *   HCAP with respiratory failure requiring several days of intubation.  Extubated on 10/20.  Respiratory status currently stable  *    Hypokalemia.  *    Macrocytosis, not currently anemic but recent minor anemia.  *    Alcoholism.  History of alcohol withdrawal seizures on admissions in the past few months.  Has been living at his SNF so assume he has been abstinent from alcohol.    PLAN:     *   Needs EGD with possible esophageal dilatation.  The soonest this would be done is tomorrow.  We will keep him n.p.o. after clear liquid breakfast in case we can get to them.  Also will hold tomorrow's Lovenox in case we need to dilate his esophagus, will resume this on 10/25.   Azucena Freed  07/19/2018, 3:22 PM Phone [phone]

## 2018-07-19 NOTE — Care Management Important Message (Signed)
Important Message  Patient Details  Name: [name] MRN: [id number] Date of Birth: 08-05-1941   Medicare Important Message Given:     Correction due to patient illness he did not sign unsigned copy left   Agness Sibrian 07/19/2018, 1:05 PM

## 2018-07-20 DIAGNOSIS — G20 Parkinson's disease: Secondary | ICD-10-CM

## 2018-07-20 DIAGNOSIS — F0281 Dementia in other diseases classified elsewhere with behavioral disturbance: Secondary | ICD-10-CM

## 2018-07-20 DIAGNOSIS — R1310 Dysphagia, unspecified: Secondary | ICD-10-CM

## 2018-07-20 MED ORDER — PREDNISONE 10 MG PO TABS
10.0000 mg | ORAL_TABLET | Freq: Every day | ORAL | Status: DC
  Administered 2018-07-21 – 2018-07-24 (×4): 10 mg via ORAL
  Filled 2018-07-20 (×4): qty 1

## 2018-07-20 MED ORDER — POTASSIUM CHLORIDE 20 MEQ PO PACK: 40.0000 meq | PACK | Freq: Two times a day (BID) | ORAL | Status: DC

## 2018-07-20 MED ORDER — POTASSIUM CHLORIDE 20 MEQ PO PACK
40.0000 meq | PACK | Freq: Two times a day (BID) | ORAL | Status: AC
  Administered 2018-07-20 (×2): 40 meq via ORAL
  Filled 2018-07-20 (×2): qty 2

## 2018-07-20 NOTE — Progress Notes (Signed)
Daily Rounding Note  07/20/2018, 12:40 PM  LOS: 14 days   SUBJECTIVE:   Chief complaint: silent dysphagia and aspiration   Pt observed coughing with po.  OBJECTIVE:         Vital signs in last 24 hours:    Temp:  [97.7 F (36.5 C)] 97.7 F (36.5 C) (10/24 0736) Pulse Rate:  [72-84] 72 (10/24 0812) Resp:  [13-27] 27 (10/24 0812) BP: (114-138)/(73-95) 116/95 (10/24 0812) SpO2:  [94 %-98 %] 94 % (10/24 0812) Last BM Date: 07/19/18 Filed Weights   07/16/18 0455 07/17/18 0341 07/18/18 1611  Weight: 76.4 kg 64.9 kg 64.8 kg   General: frail, weak, sleeping but arouseable   Heart: RRR Chest: diminished but clear BS bil   .  Wet vocal quality and slurred speach Abdomen: soft, NT.  Active BS  Extremities: no CCE Neuro/Psych:  Oriented to year, state, self.  Asking about procedure  Intake/Output from previous day: 10/23 0701 - 10/24 0700 In: 120 [P.O.:120] Out: -   Intake/Output this shift: No intake/output data recorded.  Lab Results: Recent Labs    07/19/18 0408  WBC 9.2  HGB 13.0  HCT 41.6  PLT 539*   BMET Recent Labs    07/18/18 0945 07/19/18 0408  NA 143 142  K 3.1* 3.0*  CL 106 105  CO2 29 27  GLUCOSE 107* 87  BUN 24* 22  CREATININE 0.78 0.82  CALCIUM 8.7* 8.8*   LFT Recent Labs    07/19/18 0408  PROT 6.6  ALBUMIN 2.5*  AST 20  ALT 16  ALKPHOS 51  BILITOT 0.7   PT/INR No results for input(s): LABPROT, INR in the last 72 hours. Hepatitis Panel No results for input(s): HEPBSAG, HCVAB, HEPAIGM, HEPBIGM in the last 72 hours.  Studies/Results: Dg Esophagus  Result Date: 07/19/2018 CLINICAL DATA:  Patient underwent a swallowing function study yesterday. Noted to have significant esophageal residual. Current exam ordered for further assessment. History of stroke. EXAM: ESOPHOGRAM/BARIUM SWALLOW TECHNIQUE: Single contrast examination was performed using  thin barium. FLUOROSCOPY TIME:   Fluoroscopy Time:  1 minutes and 12 seconds Radiation Exposure Index (if provided by the fluoroscopic device): 29 Number of Acquired Spot Images: 596 micro Gy per meter squared COMPARISON:  Prior chest radiographs. FINDINGS: Esophagus is normal in course and caliber.  No esophageal masses. There is narrowing of the distal esophagus above a small hiatal hernia. Narrowing extends for approximately 17 mm in length. This portion of the esophagus dilates to a maximum of 6-7 mm. No other a soft joint narrowing. No evidence of active inflammation or ulceration. There is significant dysmotility with diffuse tertiary contractions from the mid through the distal esophagus leading to significant barium stasis. No reflux was documented during the exam. IMPRESSION: 1. Distal esophageal stricture extending for a length of approximately 17 mm, narrowing the esophagus to approximately 6-7 mm. This has the appearance of a benign stricture. 2. Significant esophageal dysmotility with tertiary contractions and barium stasis. Electronically Signed   By: Lajean Manes M.D.   On: 07/19/2018 09:23   Scheduled Meds: . aspirin  81 mg Oral Daily  . Chlorhexidine Gluconate Cloth  6 each Topical Daily  . cholecalciferol  1,000 Units Oral Daily  . clonazepam  0.5 mg Oral Daily  . [START ON 07/21/2018] enoxaparin (LOVENOX) injection  40 mg Subcutaneous Daily  . folic acid  1 mg Oral Daily  . furosemide  40 mg Intravenous Q12H  .  gemfibrozil  600 mg Oral BID AC  . ipratropium-albuterol  3 mL Nebulization TID  . levETIRAcetam  500 mg Oral BID  . mouth rinse  15 mL Mouth Rinse BID  . nicotine  21 mg Transdermal Daily  . pantoprazole  40 mg Oral Q1200  . predniSONE  20 mg Oral Q breakfast  . QUEtiapine  100 mg Oral BID  . sodium chloride flush  10-40 mL Intracatheter Q12H  . thiamine  100 mg Oral Daily   Continuous Infusions: . sodium chloride Stopped (07/17/18 2047)   PRN Meds:.haloperidol lactate, magnesium hydroxide, sodium  chloride flush  ASSESMENT:   *   Dysphagia.   dysmotility and esoph stricture on esophagram  Daily empiric oral PPI in place.    *   HCAP.     PLAN   *   EGD, possible dilatation.botox injection tomorrow    Azucena Freed  07/20/2018, 12:40 PM Phone [phone]

## 2018-07-20 NOTE — Progress Notes (Signed)
Nutrition Follow-up  DOCUMENTATION CODES:   Non-severe (moderate) malnutrition in context of chronic illness  INTERVENTION:   - Diet advancement as able per SLP  - Once diet advanced, Ensure Enlive po BID, each supplement provides 350 kcal and 20 grams of protein  - Once diet advanced, Magic cup BID with meals, each supplement provides 290 kcal and 9 grams of protein  NUTRITION DIAGNOSIS:   Moderate Malnutrition related to chronic illness (COPD) as evidenced by moderate fat depletion, severe fat depletion, mild muscle depletion, moderate muscle depletion.  Ongoing  GOAL:   Patient will meet greater than or equal to 90% of their needs  Unmet  MONITOR:   Diet advancement, PO intake  REASON FOR ASSESSMENT:   Consult Enteral/tube feeding initiation and management  ASSESSMENT:   77 yo male with PMH of COPD, pulmonary nodule, smoker, seizures, HTN, HLD, GERD, alcohol abuse, stroke who was admitted on 10/10 with HCAP. Respiratory status declined and required transfer to the ICU for intubation on 10/11.  10/12 - tube feeding initiated 10/20 - extubated  Spoke with NT at bedside. Pt sleeping soundly at time of visit. Per NT, pt has been complaining of hunger, stating, "there better be a tray here when I get back." Noted plan for EGD today or possibly on Friday.  SLP did not see pt this AM as pt was NPO for EGD with possible dilation pending. Per previous SLP notes, recommendations for sips and chips.  Spoke with RN who reports pt was able to have clear liquid breakfast tray then NPO after breakfast pending EGD. RN states pt did "okay" with breakfast tray. RN unsure whether pt will have EGD today or tomorrow. Per RN, pt "gulps" liquids down and has to be reminded to slow down.  Pt with significant weight fluctuations between 64.8 kg and 79.4 kg since admission. Weight on admission recorded as 67.8 kg; current weight is 64.8 kg. Suspect pt has had true weight loss that may be  masked by fluid fluctuations. Will continue to follow weight trends during admission.  Medications reviewed and include: vitamin D 0960 units daily, folic acid 1 mg daily, Lasix 40 mg q 12 hours, Lopid 600 mg BID, Protonix 40 mg daily, thiamine 100 mg daily  Labs reviewed: potassium 3.0 (L)  Diet Order:   Diet Order            Diet clear liquid Room service appropriate? Yes; Fluid consistency: Thin  Diet effective 0500 tomorrow              EDUCATION NEEDS:   No education needs have been identified at this time  Skin:  Skin Assessment: Reviewed RN Assessment  Last BM:  10/23 medium type 5  Height:   Ht Readings from Last 1 Encounters:  07/18/18 5\' 11"  (1.803 m)    Weight:   Wt Readings from Last 1 Encounters:  07/18/18 64.8 kg    Ideal Body Weight:  78.2 kg  BMI:  Body mass index is 19.92 kg/m.  Estimated Nutritional Needs:   Kcal:  1700-1900  Protein:  80-90 gm  Fluid:  1.7-1.9 L    Gaynell Face, MS, RD, LDN Inpatient Clinical Dietitian Pager: [phone] Weekend/After Hours: [phone]

## 2018-07-20 NOTE — Progress Notes (Signed)
SLP Cancellation Note  Patient Details Name: Blake [name] MRN: [id number] DOB: [birth year]   Cancelled treatment:       Reason Eval/Treat Not Completed: Medical issues which prohibited therapy. Pt NPO for EGD with possible dilation pending. Will follow up.  Deneise Lever, [place], CCC-SLP Speech-Language Pathologist Acute Rehabilitation Services Pager: [phone] Office: [phone]    Aliene Altes 07/20/2018, 8:48 AM

## 2018-07-20 NOTE — Progress Notes (Signed)
[place] TEAM 1 - Stepdown/ICU TEAM  Blake [name]  TML:[id number] DOB: August 06, 1941 DOA: 07/06/2018 PCP: Hendricks Limes, MD    Brief Narrative:  77 y/o male w/ a hx of CVA, EtOH abuse, GERD, and COPD who presented from a SNF with HCAP intubated for acute respiratory failure with hypoxemia.    Significant Events: 10/10 admit 10/11 transfer to ICU for intubation 10/15 precedex > brady 20's on 0.6 (avoid precedex) 10/17 added seroquel / klonopin 10/18 bronchoscopy - thick secretions lower lobes  10/19 weaned PS 5/5 x 30 mis but then tachypenic 10/20 extubated  Subjective: Patient more somnolent today.  He gets at times agitated with staff, especially at night  Assessment & Plan:  Aspiration pneumonia Completed antibiotics-10-day course of Zosyn  Esophageal dysmotility/lower esophageal stricture Seen by GI.  Plans for endoscopy either later today or tomorrow.  Hopefully can improve stricture which will allow patient to eat and be less of an aspiration risk  COPD Stable.  Solu-Medrol changed to prednisone, continue to titrate down  Acute pulmonary edema Wean O2 as able   History of stroke   Hx of Seizures Likely related to EtOH withdrawal.  No current seizure activity  Moderate protein calorie malnutrition: Patient meets criteria as evidenced by moderate and severe fat depletion and mild to moderate muscle depletion.  Seen by nutrition.  Once he is better able to take p.o., could look at trying to improve this.  Acute metabolic encephalopathy likely in the setting of chronic dementia continue seroquel + prn haldol.  Still gets episodes of occasional agitation.  We will try Ativan at night scheduled  EtOH abuse Thiamine/folate continue   DVT prophylaxis: lovenox  Code Status: FULL CODE Family Communication: no family present at time Disposition Plan: Continue in stepdown due to patient's confusion, nursing time commitment  Consultants:  PCCM  Antimicrobials:   Zosyn 10/10-10/20  Objective: Blood pressure 116/65, pulse 83, temperature 97.7 F (36.5 C), temperature source Oral, resp. rate (!) 23, height 5\' 11"  (1.803 m), weight 64.8 kg, SpO2 92 %.  Intake/Output Summary (Last 24 hours) at 07/20/2018 1403 Last data filed at 07/19/2018 2347 Gross per 24 hour  Intake 120 ml  Output -  Net 120 ml   Filed Weights   07/16/18 0455 07/17/18 0341 07/18/18 1611  Weight: 76.4 kg 64.9 kg 64.8 kg    Examination: General: Somnolent, no acute distress HEENT: Normocephalic and atraumatic, mucous membranes are dry Neck: Supple, no JVD Lungs: Decreased breath sounds bibasilar Cardiovascular: Regular rate and rhythm, S1-S2, occasional ectopic beat Abdomen: Soft, nontender, nondistended, hypoactive bowel sounds Extremities: No significant cyanosis, clubbing, or edema bilateral lower extremities Skin: Dry, exudative dermatitis Psychiatry: Underlying dementia  CBC: Recent Labs  Lab 07/16/18 0402 07/17/18 0331 07/19/18 0408  WBC 12.8* 12.5* 9.2  HGB 12.0* 11.8* 13.0  HCT 38.8* 37.5* 41.6  MCV 108.7* 106.5* 103.7*  PLT 496* 511* 681*   Basic Metabolic Panel: Recent Labs  Lab 07/14/18 0319 07/15/18 0308  07/17/18 0331 07/18/18 0945 07/19/18 0408  NA 143 144   < > 146* 143 142  K 3.7 3.4*   < > 3.2* 3.1* 3.0*  CL 103 102   < > 104 106 105  CO2 33* 35*   < > 34* 29 27  GLUCOSE 180* 179*   < > 122* 107* 87  BUN 20 24*   < > 26* 24* 22  CREATININE 0.78 0.80   < > 0.89 0.78 0.82  CALCIUM 8.1* 8.0*   < >  8.7* 8.7* 8.8*  MG 2.3 2.1  --  2.3  --  2.2  PHOS 3.9 3.4  --  3.2  --   --    < > = values in this interval not displayed.   GFR: Estimated Creatinine Clearance: 69.1 mL/min (by C-G formula based on SCr of 0.82 mg/dL).  Liver Function Tests: Recent Labs  Lab 07/19/18 0408  AST 20  ALT 16  ALKPHOS 51  BILITOT 0.7  PROT 6.6  ALBUMIN 2.5*   No results for input(s): LIPASE, AMYLASE in the last 168 hours. No results for input(s):  AMMONIA in the last 168 hours.   HbA1C: Hgb A1c MFr Bld  Date/Time Value Ref Range Status  08/27/2016 10:20 AM 4.5 (L) 4.6 - 6.5 % Final    Comment:    Glycemic Control Guidelines for People with Diabetes:Non Diabetic:  <6%Goal of Therapy: <7%Additional Action Suggested:  >8%   10/15/2014 03:17 AM 4.7 <5.7 % Final    Comment:    (NOTE)                                                                       According to the ADA Clinical Practice Recommendations for 2011, when HbA1c is used as a screening test:  >=6.5%   Diagnostic of Diabetes Mellitus           (if abnormal result is confirmed) 5.7-6.4%   Increased risk of developing Diabetes Mellitus References:Diagnosis and Classification of Diabetes Mellitus,Diabetes NWGN,5621,30(QMVHQ 1):S62-S69 and Standards of Medical Care in         Diabetes - 2011,Diabetes IONG,2952,84 (Suppl 1):S11-S61.     CBG: Recent Labs  Lab 07/17/18 2342 07/18/18 0418 07/18/18 0728 07/18/18 1116 07/18/18 1526  GLUCAP 97 91 108* 129* 169*    Recent Results (from the past 240 hour(s))  Expectorated sputum assessment w rflx to resp cult     Status: None   Collection Time: 07/14/18  3:52 PM  Result Value Ref Range Status   Specimen Description SPUTUM  Final   Special Requests NONE  Final   Sputum evaluation   Final    THIS SPECIMEN IS ACCEPTABLE FOR SPUTUM CULTURE Performed at [facility], [place] [address] [place], [place] [address]    Report Status 07/14/2018 FINAL  Final  Culture, respiratory     Status: None   Collection Time: 07/14/18  3:52 PM  Result Value Ref Range Status   Specimen Description SPUTUM  Final   Special Requests NONE Reflexed from [id number]  Final   Gram Stain   Final    ABUNDANT WBC PRESENT, PREDOMINANTLY PMN NO ORGANISMS SEEN Performed at [facility], [place] [address], [place], [place] [address]    Culture RARE CANDIDA ALBICANS  Final   Report Status 07/17/2018 FINAL  Final     Scheduled Meds: . aspirin   81 mg Oral Daily  . Chlorhexidine Gluconate Cloth  6 each Topical Daily  . cholecalciferol  1,000 Units Oral Daily  . clonazepam  0.5 mg Oral Daily  . [START ON 07/21/2018] enoxaparin (LOVENOX) injection  40 mg Subcutaneous Daily  . folic acid  1 mg Oral Daily  . furosemide  40 mg Intravenous Q12H  . gemfibrozil  600 mg Oral BID AC  . ipratropium-albuterol  3 mL Nebulization TID  . levETIRAcetam  500 mg Oral BID  . mouth rinse  15 mL Mouth Rinse BID  . nicotine  21 mg Transdermal Daily  . pantoprazole  40 mg Oral Q1200  . predniSONE  20 mg Oral Q breakfast  . QUEtiapine  100 mg Oral BID  . sodium chloride flush  10-40 mL Intracatheter Q12H  . thiamine  100 mg Oral Daily     LOS: 14 days   Annita Brod, MD [facility]  To reach me or the doctor on call, go to: [email or web address] Password [email or web address]  If 7PM-7AM, please contact night-coverage per Amion 07/20/2018, 2:03 PM

## 2018-07-20 NOTE — Plan of Care (Signed)
  Problem: Education: Goal: Knowledge of General Education information will improve Description Including pain rating scale, medication(s)/side effects and non-pharmacologic comfort measures Outcome: Progressing   Problem: Health Behavior/Discharge Planning: Goal: Ability to manage health-related needs will improve Outcome: Progressing   Problem: Clinical Measurements: Goal: Ability to maintain clinical measurements within normal limits will improve Outcome: Progressing Goal: Will remain free from infection Outcome: Progressing Goal: Diagnostic test results will improve Outcome: Progressing Goal: Respiratory complications will improve Outcome: Progressing Goal: Cardiovascular complication will be avoided Outcome: Progressing   Problem: Activity: Goal: Risk for activity intolerance will decrease Outcome: Progressing   Problem: Nutrition: Goal: Adequate nutrition will be maintained Outcome: Progressing   Problem: Coping: Goal: Level of anxiety will decrease Outcome: Progressing   Problem: Elimination: Goal: Will not experience complications related to bowel motility Outcome: Progressing Goal: Will not experience complications related to urinary retention Outcome: Progressing   Problem: Pain Managment: Goal: General experience of comfort will improve Outcome: Progressing   Problem: Safety: Goal: Ability to remain free from injury will improve Outcome: Progressing   Problem: Skin Integrity: Goal: Risk for impaired skin integrity will decrease Outcome: Progressing   Problem: Spiritual Needs Goal: Ability to function at adequate level Outcome: Progressing   Problem: Activity: Goal: Ability to tolerate increased activity will improve Outcome: Progressing   Problem: Clinical Measurements: Goal: Ability to maintain a body temperature in the normal range will improve Outcome: Progressing   Problem: Respiratory: Goal: Ability to maintain adequate ventilation will  improve Outcome: Progressing Goal: Ability to maintain a clear airway will improve Outcome: Progressing   Problem: Respiratory: Goal: Ability to maintain a clear airway and adequate ventilation will improve Outcome: Progressing   Problem: Role Relationship: Goal: Method of communication will improve Outcome: Progressing   Problem: Cardiac: Goal: Ability to maintain adequate oxygenation and ventilation will improve Outcome: Progressing

## 2018-07-20 NOTE — H&P (View-Only) (Signed)
Daily Rounding Note  07/20/2018, 12:40 PM  LOS: 14 days   SUBJECTIVE:   Chief complaint: silent dysphagia and aspiration   Pt observed coughing with po.  OBJECTIVE:         Vital signs in last 24 hours:    Temp:  [97.7 F (36.5 C)] 97.7 F (36.5 C) (10/24 0736) Pulse Rate:  [72-84] 72 (10/24 0812) Resp:  [13-27] 27 (10/24 0812) BP: (114-138)/(73-95) 116/95 (10/24 0812) SpO2:  [94 %-98 %] 94 % (10/24 0812) Last BM Date: 07/19/18 Filed Weights   07/16/18 0455 07/17/18 0341 07/18/18 1611  Weight: 76.4 kg 64.9 kg 64.8 kg   General: frail, weak, sleeping but arouseable   Heart: RRR Chest: diminished but clear BS bil   .  Wet vocal quality and slurred speach Abdomen: soft, NT.  Active BS  Extremities: no CCE Neuro/Psych:  Oriented to year, state, self.  Asking about procedure  Intake/Output from previous day: 10/23 0701 - 10/24 0700 In: 120 [P.O.:120] Out: -   Intake/Output this shift: No intake/output data recorded.  Lab Results: Recent Labs    07/19/18 0408  WBC 9.2  HGB 13.0  HCT 41.6  PLT 539*   BMET Recent Labs    07/18/18 0945 07/19/18 0408  NA 143 142  K 3.1* 3.0*  CL 106 105  CO2 29 27  GLUCOSE 107* 87  BUN 24* 22  CREATININE 0.78 0.82  CALCIUM 8.7* 8.8*   LFT Recent Labs    07/19/18 0408  PROT 6.6  ALBUMIN 2.5*  AST 20  ALT 16  ALKPHOS 51  BILITOT 0.7   PT/INR No results for input(s): LABPROT, INR in the last 72 hours. Hepatitis Panel No results for input(s): HEPBSAG, HCVAB, HEPAIGM, HEPBIGM in the last 72 hours.  Studies/Results: Dg Esophagus  Result Date: 07/19/2018 CLINICAL DATA:  Patient underwent a swallowing function study yesterday. Noted to have significant esophageal residual. Current exam ordered for further assessment. History of stroke. EXAM: ESOPHOGRAM/BARIUM SWALLOW TECHNIQUE: Single contrast examination was performed using  thin barium. FLUOROSCOPY TIME:   Fluoroscopy Time:  1 minutes and 12 seconds Radiation Exposure Index (if provided by the fluoroscopic device): 29 Number of Acquired Spot Images: 596 micro Gy per meter squared COMPARISON:  Prior chest radiographs. FINDINGS: Esophagus is normal in course and caliber.  No esophageal masses. There is narrowing of the distal esophagus above a small hiatal hernia. Narrowing extends for approximately 17 mm in length. This portion of the esophagus dilates to a maximum of 6-7 mm. No other a soft joint narrowing. No evidence of active inflammation or ulceration. There is significant dysmotility with diffuse tertiary contractions from the mid through the distal esophagus leading to significant barium stasis. No reflux was documented during the exam. IMPRESSION: 1. Distal esophageal stricture extending for a length of approximately 17 mm, narrowing the esophagus to approximately 6-7 mm. This has the appearance of a benign stricture. 2. Significant esophageal dysmotility with tertiary contractions and barium stasis. Electronically Signed   By: Lajean Manes M.D.   On: 07/19/2018 09:23   Scheduled Meds: . aspirin  81 mg Oral Daily  . Chlorhexidine Gluconate Cloth  6 each Topical Daily  . cholecalciferol  1,000 Units Oral Daily  . clonazepam  0.5 mg Oral Daily  . [START ON 07/21/2018] enoxaparin (LOVENOX) injection  40 mg Subcutaneous Daily  . folic acid  1 mg Oral Daily  . furosemide  40 mg Intravenous Q12H  .  gemfibrozil  600 mg Oral BID AC  . ipratropium-albuterol  3 mL Nebulization TID  . levETIRAcetam  500 mg Oral BID  . mouth rinse  15 mL Mouth Rinse BID  . nicotine  21 mg Transdermal Daily  . pantoprazole  40 mg Oral Q1200  . predniSONE  20 mg Oral Q breakfast  . QUEtiapine  100 mg Oral BID  . sodium chloride flush  10-40 mL Intracatheter Q12H  . thiamine  100 mg Oral Daily   Continuous Infusions: . sodium chloride Stopped (07/17/18 2047)   PRN Meds:.haloperidol lactate, magnesium hydroxide, sodium  chloride flush  ASSESMENT:   *   Dysphagia.   dysmotility and esoph stricture on esophagram  Daily empiric oral PPI in place.    *   HCAP.     PLAN   *   EGD, possible dilatation.botox injection tomorrow    Azucena Freed  07/20/2018, 12:40 PM Phone [phone]

## 2018-07-21 ENCOUNTER — Inpatient Hospital Stay (HOSPITAL_COMMUNITY): Payer: Medicare Other | Admitting: Anesthesiology

## 2018-07-21 ENCOUNTER — Encounter (HOSPITAL_COMMUNITY)
Admission: EM | Disposition: A | Discharge status: Skilled Nursing Facility | Payer: Self-pay | Source: Skilled Nursing Facility | Attending: Pulmonary Disease

## 2018-07-21 ENCOUNTER — Encounter (HOSPITAL_COMMUNITY): Payer: Self-pay | Admitting: Anesthesiology

## 2018-07-21 ENCOUNTER — Telehealth: Payer: Self-pay

## 2018-07-21 DIAGNOSIS — K5750 Diverticulosis of both small and large intestine without perforation or abscess without bleeding: Secondary | ICD-10-CM

## 2018-07-21 DIAGNOSIS — K449 Diaphragmatic hernia without obstruction or gangrene: Secondary | ICD-10-CM

## 2018-07-21 DIAGNOSIS — R933 Abnormal findings on diagnostic imaging of other parts of digestive tract: Secondary | ICD-10-CM

## 2018-07-21 DIAGNOSIS — K222 Esophageal obstruction: Secondary | ICD-10-CM

## 2018-07-21 HISTORY — PX: ESOPHAGOGASTRODUODENOSCOPY (EGD) WITH PROPOFOL: SHX5813

## 2018-07-21 LAB — BASIC METABOLIC PANEL
Anion gap: 9 (ref 5–15)
BUN: 17 mg/dL (ref 8–23)
CALCIUM: 8.5 mg/dL — AB (ref 8.9–10.3)
CO2: 25 mmol/L (ref 22–32)
CREATININE: 0.82 mg/dL (ref 0.61–1.24)
Chloride: 104 mmol/L (ref 98–111)
GFR calc non Af Amer: >60 mL/min (ref >60)
Glucose, Bld: 106 mg/dL — ABNORMAL HIGH (ref 70–99)
Potassium: 3.6 mmol/L (ref 3.5–5.1)
SODIUM: 138 mmol/L (ref 135–145)

## 2018-07-21 SURGERY — ESOPHAGOGASTRODUODENOSCOPY (EGD) WITH PROPOFOL
Anesthesia: Monitor Anesthesia Care

## 2018-07-21 MED ORDER — QUETIAPINE FUMARATE 25 MG PO TABS
50.0000 mg | ORAL_TABLET | Freq: Two times a day (BID) | ORAL | Status: DC
  Administered 2018-07-21 – 2018-07-24 (×6): 50 mg via ORAL
  Filled 2018-07-21 (×6): qty 2

## 2018-07-21 MED ORDER — SODIUM CHLORIDE 0.9 % IV SOLN
INTRAVENOUS | Status: DC | PRN
  Administered 2018-07-21: 20 ug/min via INTRAVENOUS

## 2018-07-21 MED ORDER — HALOPERIDOL LACTATE 5 MG/ML IJ SOLN
1.0000 mg | INTRAMUSCULAR | Status: DC | PRN
  Administered 2018-07-21 – 2018-07-23 (×2): 2 mg via INTRAVENOUS
  Filled 2018-07-21 (×2): qty 1

## 2018-07-21 MED ORDER — PROPOFOL 500 MG/50ML IV EMUL
INTRAVENOUS | Status: DC | PRN
  Administered 2018-07-21: 100 ug/kg/min via INTRAVENOUS

## 2018-07-21 MED ORDER — PROPOFOL 10 MG/ML IV BOLUS
INTRAVENOUS | Status: DC | PRN
  Administered 2018-07-21: 20 mg via INTRAVENOUS
  Administered 2018-07-21: 30 mg via INTRAVENOUS

## 2018-07-21 SURGICAL SUPPLY — 15 items

## 2018-07-21 NOTE — Transfer of Care (Signed)
Immediate Anesthesia Transfer of Care Note  Patient: Blake [name]  Procedure(s) Performed: ESOPHAGOGASTRODUODENOSCOPY (EGD) WITH PROPOFOL (N/A )  Patient Location: PACU and Endoscopy Unit  Anesthesia Type:MAC  Level of Consciousness: awake, drowsy and patient cooperative  Airway & Oxygen Therapy: Patient Spontanous Breathing and Patient connected to nasal cannula oxygen  Post-op Assessment: Report given to RN and Post -op Vital signs reviewed and stable  Post vital signs: Reviewed and stable  Last Vitals:  Vitals Value Taken Time  BP 81/48 07/21/2018 11:19 AM  Temp 36.6 C 07/21/2018 11:18 AM  Pulse 73 07/21/2018 11:19 AM  Resp 34 07/21/2018 11:19 AM  SpO2 96 % 07/21/2018 11:19 AM  Vitals shown include unvalidated device data.  Last Pain:  Vitals:   07/21/18 1118  TempSrc: Oral  PainSc:       Patients Stated Pain Goal: 0 (46/00/29 8473)  Complications: No apparent anesthesia complications

## 2018-07-21 NOTE — Anesthesia Postprocedure Evaluation (Signed)
Anesthesia Post Note  Patient: Blake [name]  Procedure(s) Performed: ESOPHAGOGASTRODUODENOSCOPY (EGD) WITH PROPOFOL (N/A )     Patient location during evaluation: PACU Anesthesia Type: MAC Level of consciousness: awake and alert Pain management: pain level controlled Vital Signs Assessment: post-procedure vital signs reviewed and stable Respiratory status: spontaneous breathing, nonlabored ventilation, respiratory function stable and patient connected to nasal cannula oxygen Cardiovascular status: stable and blood pressure returned to baseline Postop Assessment: no apparent nausea or vomiting Anesthetic complications: no    Last Vitals:  Vitals:   07/21/18 1120 07/21/18 1130  BP: (!) 90/57 (!) 92/48  Pulse: 73 70  Resp: 20 15  Temp:    SpO2: 96% 95%    Last Pain:  Vitals:   07/21/18 1130  TempSrc:   PainSc: 0-No pain                 Minnie Legros A.

## 2018-07-21 NOTE — Anesthesia Procedure Notes (Signed)
Procedure Name: MAC Date/Time: 07/21/2018 11:02 AM Performed by: Renato Shin, CRNA Pre-anesthesia Checklist: Patient identified, Emergency Drugs available, Suction available and Patient being monitored Patient Re-evaluated:Patient Re-evaluated prior to induction Oxygen Delivery Method: Nasal cannula Preoxygenation: Pre-oxygenation with 100% oxygen Induction Type: IV induction Placement Confirmation: positive ETCO2,  CO2 detector and breath sounds checked- equal and bilateral Dental Injury: Teeth and Oropharynx as per pre-operative assessment

## 2018-07-21 NOTE — Telephone Encounter (Signed)
appt made for 11/22 at 1:30 pm with Dr Bryan Lemma

## 2018-07-21 NOTE — Progress Notes (Signed)
[place] TEAM 1 - Stepdown/ICU TEAM  Blake [name]  GEX:[id number] DOB: Sep 29, 1940 DOA: 07/06/2018 PCP: Hendricks Limes, MD    Brief Narrative:  77 y/o male w/ a hx of CVA, EtOH abuse, GERD, and COPD who presented from a SNF with HCAP and was intubated for acute respiratory failure with hypoxemia.    Significant Events: 10/10 admit 10/11 transfer to ICU for intubation 10/15 precedex > brady 20's on 0.6 (avoid precedex) 10/17 added seroquel / klonopin 10/18 bronchoscopy - thick secretions lower lobes  10/19 weaned PS 5/5 x 30 mis but then tachypenic 10/20 extubated  Subjective: Much more alert and calm today compared to when I saw him 3 days ago. Eating full liquid diet w/o trouble. Denies cp, sob, n/v, or abdom pain.   Assessment & Plan:  Aspiration pneumonia Completed a 10 day course of Zosyn - no evidence of active infection at this time   Esophageal dysphagia  DG esophagus noted stricture - EGD 10/25 noted benign esophageal stenosis which was dilated w/ the scope - will need repeat EGD in 8 weeks to f/u and dilate again - diet being advanced as tolerates presently   COPD titrating off steroids - continue scheduled duoneb - clinically improving   Acute pulmonary edema Wean O2 as able - clinically resolved   History of stroke   Hx of Seizures Likely related to EtOH withdrawal   Acute metabolic encephalopathy continue seroquel + prn haldol  EtOH abuse Thiamine/folate continue   DVT prophylaxis: lovenox  Code Status: FULL CODE Family Communication: no family present at time of exam  Disposition Plan: transfer to tele bed - advance diet as able - once stable on diet will be ready for SNF   Consultants:  PCCM  Antimicrobials:  Zosyn 10/10 > 10/20  Objective: Blood pressure (!) 118/44, pulse 84, temperature (!) 97.5 F (36.4 C), temperature source Oral, resp. rate 20, height 5\' 11"  (1.803 m), weight 64.8 kg, SpO2 94 %.  Intake/Output Summary (Last 24  hours) at 07/21/2018 1541 Last data filed at 07/21/2018 1300 Gross per 24 hour  Intake 300 ml  Output 2500 ml  Net -2200 ml   Filed Weights   07/16/18 0455 07/17/18 0341 07/18/18 1611  Weight: 76.4 kg 64.9 kg 64.8 kg    Examination: General: No acute respiratory distress - much more alert and calm  Lungs: CTA th/o - no wheezing  Cardiovascular: Regular rate and rhythm without murmur  Abdomen: thin, NT/ND, soft, bowel sounds positive, no rebound Extremities: No signif edema B LE  CBC: Recent Labs  Lab 07/16/18 0402 07/17/18 0331 07/19/18 0408  WBC 12.8* 12.5* 9.2  HGB 12.0* 11.8* 13.0  HCT 38.8* 37.5* 41.6  MCV 108.7* 106.5* 103.7*  PLT 496* 511* 010*   Basic Metabolic Panel: Recent Labs  Lab 07/15/18 0308  07/17/18 0331 07/18/18 0945 07/19/18 0408 07/21/18 0353  NA 144   < > 146* 143 142 138  K 3.4*   < > 3.2* 3.1* 3.0* 3.6  CL 102   < > 104 106 105 104  CO2 35*   < > 34* 29 27 25   GLUCOSE 179*   < > 122* 107* 87 106*  BUN 24*   < > 26* 24* 22 17  CREATININE 0.80   < > 0.89 0.78 0.82 0.82  CALCIUM 8.0*   < > 8.7* 8.7* 8.8* 8.5*  MG 2.1  --  2.3  --  2.2  --   PHOS 3.4  --  3.2  --   --   --    < > = values in this interval not displayed.   GFR: Estimated Creatinine Clearance: 69.1 mL/min (by C-G formula based on SCr of 0.82 mg/dL).  Liver Function Tests: Recent Labs  Lab 07/19/18 0408  AST 20  ALT 16  ALKPHOS 51  BILITOT 0.7  PROT 6.6  ALBUMIN 2.5*    HbA1C: Hgb A1c MFr Bld  Date/Time Value Ref Range Status  08/27/2016 10:20 AM 4.5 (L) 4.6 - 6.5 % Final    Comment:    Glycemic Control Guidelines for People with Diabetes:Non Diabetic:  <6%Goal of Therapy: <7%Additional Action Suggested:  >8%   10/15/2014 03:17 AM 4.7 <5.7 % Final    Comment:    (NOTE)                                                                       According to the ADA Clinical Practice Recommendations for 2011, when HbA1c is used as a screening test:  >=6.5%    Diagnostic of Diabetes Mellitus           (if abnormal result is confirmed) 5.7-6.4%   Increased risk of developing Diabetes Mellitus References:Diagnosis and Classification of Diabetes Mellitus,Diabetes STMH,9622,29(NLGXQ 1):S62-S69 and Standards of Medical Care in         Diabetes - 2011,Diabetes JJHE,1740,81 (Suppl 1):S11-S61.     CBG: Recent Labs  Lab 07/17/18 2342 07/18/18 0418 07/18/18 0728 07/18/18 1116 07/18/18 1526  GLUCAP 97 91 108* 129* 169*    Recent Results (from the past 240 hour(s))  Expectorated sputum assessment w rflx to resp cult     Status: None   Collection Time: 07/14/18  3:52 PM  Result Value Ref Range Status   Specimen Description SPUTUM  Final   Special Requests NONE  Final   Sputum evaluation   Final    THIS SPECIMEN IS ACCEPTABLE FOR SPUTUM CULTURE Performed at [facility], [place] [address], [place], [place] [address]    Report Status 07/14/2018 FINAL  Final  Culture, respiratory     Status: None   Collection Time: 07/14/18  3:52 PM  Result Value Ref Range Status   Specimen Description SPUTUM  Final   Special Requests NONE Reflexed from [id number]  Final   Gram Stain   Final    ABUNDANT WBC PRESENT, PREDOMINANTLY PMN NO ORGANISMS SEEN Performed at [facility], [place] [address] [place], [place] [address]    Culture RARE CANDIDA ALBICANS  Final   Report Status 07/17/2018 FINAL  Final     Scheduled Meds: . aspirin  81 mg Oral Daily  . Chlorhexidine Gluconate Cloth  6 each Topical Daily  . cholecalciferol  1,000 Units Oral Daily  . clonazepam  0.5 mg Oral Daily  . enoxaparin (LOVENOX) injection  40 mg Subcutaneous Daily  . folic acid  1 mg Oral Daily  . gemfibrozil  600 mg Oral BID AC  . ipratropium-albuterol  3 mL Nebulization TID  . levETIRAcetam  500 mg Oral BID  . mouth rinse  15 mL Mouth Rinse BID  . nicotine  21 mg Transdermal Daily  . pantoprazole  40 mg Oral Q1200  . predniSONE  10  mg Oral Q breakfast  . QUEtiapine  50 mg  Oral BID  . sodium chloride flush  10-40 mL Intracatheter Q12H  . thiamine  100 mg Oral Daily     LOS: 15 days   Cherene Altes, MD [facility] Office  [phone] Pager - Text Page per Shea Evans  If 7PM-7AM, please contact night-coverage per Amion 07/21/2018, 3:41 PM

## 2018-07-21 NOTE — Interval H&P Note (Signed)
History and Physical Interval Note:  07/21/2018 11:07 AM  Blake [name]  has presented today for surgery, with the diagnosis of Dysphagia.  Possible esophageal stricture.  The various methods of treatment have been discussed with the patient and family. After consideration of risks, benefits and other options for treatment, the patient has consented to  Procedure(s): ESOPHAGOGASTRODUODENOSCOPY (EGD) WITH PROPOFOL (N/A) BALLOON DILATION (N/A) as a surgical intervention .  The patient's history has been reviewed, patient examined, no change in status, stable for surgery.  I have reviewed the patient's chart and labs.  Questions were answered to the patient's satisfaction.     Blake [name]

## 2018-07-21 NOTE — Op Note (Signed)
[facility] Patient Name: Blake [name] Procedure Date : 07/21/2018 MRN: [id number] Attending MD: Gerrit Heck , MD Date of Birth: 11-02-1940 CSN: [id number] Age: 77 Admit Type: Inpatient Procedure:                Upper GI endoscopy Indications:              Abnormal UGI series Providers:                Gerrit Heck, MD, Vista Lawman, RN, Elspeth Cho Tech., Technician, Lowell Bouton, CRNA Referring MD:              Medicines:                Monitored Anesthesia Care Complications:            No immediate complications. Estimated Blood Loss:     Estimated blood loss was minimal. Procedure:                Pre-Anesthesia Assessment:                           - Prior to the procedure, a History and Physical                            was performed, and patient medications and                            allergies were reviewed. The patient's tolerance of                            previous anesthesia was also reviewed. The risks                            and benefits of the procedure and the sedation                            options and risks were discussed with the patient.                            All questions were answered, and informed consent                            was obtained. Prior Anticoagulants: The patient has                            taken aspirin, last dose was day of procedure. Also                            administered Lovenox this morning. ASA Grade                            Assessment: III - A patient with severe systemic  disease. After reviewing the risks and benefits,                            the patient was deemed in satisfactory condition to                            undergo the procedure.                           After obtaining informed consent, the endoscope was                            passed under direct vision. Throughout the                            procedure, the  patient's blood pressure, pulse, and                            oxygen saturations were monitored continuously. The                            GIF-H190 ([id number]) Olympus Adult EGD was introduced                            through the mouth, and advanced to the second part                            of duodenum. The upper GI endoscopy was                            accomplished without difficulty. The patient                            tolerated the procedure well. Scope In: Scope Out: Findings:      One benign-appearing, intrinsic moderate stenosis was found 37 cm from       the incisors. This stenosis measured 1 cm (in length). The stenosis was       traversed. This was dilated with the endoscope alone. The dilation site       was examined and showed mild mucosal disruption. Estimated blood loss       was minimal.      A 3 cm hiatal hernia was present.      Esophagogastric landmarks were identified: the Z-line was found at 38       cm, the gastroesophageal junction was found at 38 cm and the site of       hiatal narrowing was found at 41 cm from the incisors.      The lower third of the esophagus was mildly tortuous.      The entire examined stomach was normal.      A medium non-bleeding diverticulum was found in the second portion of       the duodenum.      Normal mucosa was found in the duodenal bulb, in the first portion of       the duodenum and in the second portion of the duodenum. Impression:               -  Benign-appearing esophageal stenosis. This was                            dilated with the endoscope alone with an                            appropriate small mucosal rent.                           - 3 cm hiatal hernia.                           - Esophagogastric landmarks identified as above.                           - Tortuous esophagus.                           - Normal stomach.                           - Non-bleeding duodenal diverticulum.                            - Normal mucosa was found in the duodenal bulb, in                            the first portion of the duodenum and in the second                            portion of the duodenum.                           - No specimens collected. Recommendation:           - Return patient to hospital ward for ongoing care.                           - Resume previous diet per Nutrition                            recommendations based on recent UGI series and MBS                            study results.                           - Continue present medications.                           - Repeat upper endoscopy in 8 weeks for retreatment                            and further dilation. Recommend holding ASA as                            possible for  that procedure to allow for further                            balloon dilation.                           - Return to GI clinic at appointment to be                            scheduled.                           - Pending response to serial endoscopy with                            dilations, may also need to consider evaluation                            with Esophageal Manometry. Procedure Code(s):        --- Professional ---                           [phone], Esophagogastroduodenoscopy, flexible,                            transoral; diagnostic, including collection of                            specimen(s) by brushing or washing, when performed                            (separate procedure) Diagnosis Code(s):        --- Professional ---                           K22.2, Esophageal obstruction                           K44.9, Diaphragmatic hernia without obstruction or                            gangrene                           Q39.9, Congenital malformation of esophagus,                            unspecified                           K57.10, Diverticulosis of small intestine without                            perforation or abscess without  bleeding                           R93.3, Abnormal findings on diagnostic imaging of  other parts of digestive tract CPT copyright 2018 American Medical Association. All rights reserved. The codes documented in this report are preliminary and upon coder review may  be revised to meet current compliance requirements. Gerrit Heck, MD 07/21/2018 11:25:11 AM Number of Addenda: 0

## 2018-07-21 NOTE — Anesthesia Preprocedure Evaluation (Addendum)
Anesthesia Evaluation  Patient identified by MRN, date of birth, ID band Patient awake    Reviewed: Allergy & Precautions, NPO status , Patient's Chart, lab work & pertinent test results  Airway Mallampati: II  TM Distance: >3 FB Neck ROM: Full    Dental  (+) Edentulous Upper, Edentulous Lower   Pulmonary shortness of breath, pneumonia, resolved, COPD,  COPD inhaler, Current Smoker,    Pulmonary exam normal breath sounds clear to auscultation       Cardiovascular hypertension, Pt. on medications Normal cardiovascular exam Rhythm:Regular Rate:Normal     Neuro/Psych Seizures -, Well Controlled,  PSYCHIATRIC DISORDERS CVA    GI/Hepatic GERD  Medicated and Controlled,(+)     substance abuse  alcohol use, Dysphagia Possible esophageal stricture   Endo/Other  diabetes, Well ControlledHyperlipidemia  Renal/GU negative Renal ROS   Elevated PSA    Musculoskeletal  (+) Arthritis ,   Abdominal   Peds  Hematology   Anesthesia Other Findings   Reproductive/Obstetrics                            Anesthesia Physical Anesthesia Plan  ASA: III  Anesthesia Plan: MAC   Post-op Pain Management:    Induction: Intravenous  PONV Risk Score and Plan: 1 and Ondansetron  Airway Management Planned: Natural Airway and Nasal Cannula  Additional Equipment:   Intra-op Plan:   Post-operative Plan:   Informed Consent: I have reviewed the patients History and Physical, chart, labs and discussed the procedure including the risks, benefits and alternatives for the proposed anesthesia with the patient or authorized representative who has indicated his/her understanding and acceptance.     Plan Discussed with: CRNA and Surgeon  Anesthesia Plan Comments:         Anesthesia Quick Evaluation

## 2018-07-21 NOTE — Progress Notes (Signed)
   07/21/18 1817  What Happened  Was fall witnessed? Yes  Who witnessed fall? Myrlene Broker, RN  Patients activity before fall to/from bed, chair, or stretcher  Point of contact buttocks  Was patient injured? No  Follow Up  MD notified Dr. Thereasa Solo  Time MD notified [phone]  Family notified Yes-comment  Time family notified  (Did not answer phone)  Additional tests No  Progress note created (see row info) Yes  Adult Fall Risk Assessment  Risk Factor Category (scoring not indicated) Fall has occurred during this admission (document High fall risk)  Patient Fall Risk Level High fall risk  Adult Fall Risk Interventions  Required Bundle Interventions *See Row Information* High fall risk - low, moderate, and high requirements implemented  Screening for Fall Injury Risk (To be completed on HIGH fall risk patients) - Assessing Need for Low Bed  Risk For Fall Injury- Low Bed Criteria Previous fall this admission  Will Implement Low Bed and Floor Mats Yes  Screening for Fall Injury Risk (To be completed on HIGH fall risk patients who do not meet crieteria for Low Bed) - Assessing Need for Floor Mats Only  Risk For Fall Injury- Criteria for Floor Mats None identified - No additional interventions needed  Will Implement Floor Mats Yes

## 2018-07-21 NOTE — Telephone Encounter (Signed)
-----   Message from Irving Copas., MD sent at 07/21/2018 11:51 AM EDT ----- Regarding: Patient for follow up Dear Blake [name], Can you please set this patient for a clinic follow up with myself or Dr. Bryan Lemma in the next 3-4 weeks Dysphagia and esophageal stricture follow up. Will determine based on his symptoms need for repeat Hospital EGD. Thanks.  Chester Holstein

## 2018-07-21 NOTE — Progress Notes (Signed)
Pt in endoscope and will try again at another time.    07/21/18 1000  PT Visit Information  Last PT Received On 07/21/18  Reason Eval/Treat Not Completed Patient at procedure or test/unavailable   Mee Hives, PT MS Acute Rehab Dept. Number: [place] [facility] [place]

## 2018-07-21 NOTE — Progress Notes (Signed)
SLP Cancellation Note  Patient Details Name: Blake [name] MRN: [id number] DOB: October 26, 1940   Cancelled treatment:       Reason Eval/Treat Not Completed: Medical issues which prohibited therapy. Per chart, pt NPO pending possible EGD/dilation today. Will f/u as able.   Germain Osgood 07/21/2018, 9:23 AM  Germain Osgood, M.A. [place] Acute Environmental education officer [phone] Office [phone]

## 2018-07-21 NOTE — Progress Notes (Signed)
  Speech Language Pathology Treatment: Dysphagia  Patient Details Name: Blake [name] MRN: [id number] DOB: 1941/08/12 Today's Date: 07/21/2018 Time: 1350-1404 SLP Time Calculation (min) (ACUTE ONLY): 14 min  Assessment / Plan / Recommendation Clinical Impression  Pt was seen this afternoon after EGD with dilation this morning. MD has since started full liquid diet. Upon arrival, pt was consuming thin and nectar thick (tomato juice) liquids off his lunch tray. Pt says he is not sure if he can notice any changes, although pt also had minimal awareness of prior deficits. He does not have strong cough response that has been observed over the last several days. An intermittent, more subtle cough was observed, although not unexpected given esophageal dysmotility and secondary pharyngeal effects. Would continue current diet for now. Min cues and education were provided for aspiration and esophageal precautions. Now that pt is being allowed POs, he also seems less impulsive and more receptive to education (although suspect he will need reinforcement for delayed recall). SLP to follow for tolerance as diet is advanced by MD.   HPI HPI: 77 year old smoker, nursing home resident admitted with left lower lobe HCAP and intubated for nine days for acute hypercarbic respiratory failure. He had recent admission 06/25/18 through 06/29/18 for seizures and EtOH withdrawal and was discharged to [facility] for rehab       SLP Plan  Continue with current plan of care       Recommendations  Diet recommendations: Other(comment)(full liquid diet started by MD) Liquids provided via: Cup;Straw Medication Administration: Crushed with puree Supervision: Full supervision/cueing for compensatory strategies Compensations: Slow rate;Small sips/bites;Follow solids with liquid Postural Changes and/or Swallow Maneuvers: Seated upright 90 degrees;Upright 30-60 min after meal                Oral Care Recommendations: Oral  care QID Follow up Recommendations: Skilled Nursing facility SLP Visit Diagnosis: Dysphagia, pharyngoesophageal phase (R13.14) Plan: Continue with current plan of care       GO                Germain Osgood 07/21/2018, 2:10 PM  Germain Osgood, M.A. [place] Acute Environmental education officer [phone] Office [phone]

## 2018-07-22 DIAGNOSIS — K222 Esophageal obstruction: Secondary | ICD-10-CM

## 2018-07-22 NOTE — Progress Notes (Signed)
[place] TEAM 1 - Stepdown/ICU TEAM  Blake [name]  DDU:[id number] DOB: 06/18/1941 DOA: 07/06/2018 PCP: Hendricks Limes, MD    Brief Narrative:  77 y/o male w/ a hx of CVA, EtOH abuse, GERD, and COPD who presented from a SNF with HCAP and was intubated for acute respiratory failure with hypoxemia.    Significant Events: 10/10 admit 10/11 transfer to ICU for intubation 10/15 precedex > brady 20's on 0.6 (avoid precedex) 10/17 added seroquel / klonopin 10/18 bronchoscopy - thick secretions lower lobes  10/19 weaned PS 5/5 x 30 mis but then tachypenic 10/20 extubated 10/25 EGD >> benign appearing intrinsic moderate stenosis measuring 1 cm in length found 37 cm from the incisors.  Dilation with endoscope only  Subjective:  He reports he had a poor night sleep as he was woken up by staff multiple times, while he denies any complaints, reports he is tolerating his clear diet  Assessment & Plan:  Aspiration pneumonia Completed a 10 day course of Zosyn - no evidence of active infection at this time   Esophageal dysphagia  DG esophagus noted stricture - EGD 10/25 noted benign esophageal stenosis which was dilated w/ the scope - will need repeat EGD in 8 weeks to f/u and dilate again -rating his clear diet, advance to soft GI  COPD titrating off steroids - continue scheduled duoneb - clinically improving   Acute pulmonary edema Wean O2 as able - clinically resolved   History of stroke   Hx of Seizures Likely related to EtOH withdrawal   Acute metabolic encephalopathy continue seroquel + prn haldol  EtOH abuse Thiamine/folate continue   DVT prophylaxis: lovenox  Code Status: FULL CODE Family Communication: no family present at time of exam  Disposition Plan: SNF Consultants:  PCCM GI Antimicrobials:  Zosyn 10/10 > 10/20  Objective: Blood pressure (!) 141/82, pulse 77, temperature 98 F (36.7 C), temperature source Oral, resp. rate 18, height 5\' 11"  (1.803 m),  weight 64.8 kg, SpO2 95 %.  Intake/Output Summary (Last 24 hours) at 07/22/2018 1336 Last data filed at 07/22/2018 1149 Gross per 24 hour  Intake 120 ml  Output 400 ml  Net -280 ml   Filed Weights   07/16/18 0455 07/17/18 0341 07/18/18 1611  Weight: 76.4 kg 64.9 kg 64.8 kg    Examination:  Sleeping comfortably, but wakes up and answer questions appropriately Symmetrical Chest wall movement, Good air movement bilaterally, CTAB RRR,No Gallops,Rubs or new Murmurs, No Parasternal Heave +ve B.Sounds, Abd Soft, No tenderness, No rebound - guarding or rigidity. No Cyanosis, Clubbing or edema, No new Rash or bruise     CBC: Recent Labs  Lab 07/16/18 0402 07/17/18 0331 07/19/18 0408  WBC 12.8* 12.5* 9.2  HGB 12.0* 11.8* 13.0  HCT 38.8* 37.5* 41.6  MCV 108.7* 106.5* 103.7*  PLT 496* 511* 237*   Basic Metabolic Panel: Recent Labs  Lab 07/17/18 0331 07/18/18 0945 07/19/18 0408 07/21/18 0353  NA 146* 143 142 138  K 3.2* 3.1* 3.0* 3.6  CL 104 106 105 104  CO2 34* 29 27 25   GLUCOSE 122* 107* 87 106*  BUN 26* 24* 22 17  CREATININE 0.89 0.78 0.82 0.82  CALCIUM 8.7* 8.7* 8.8* 8.5*  MG 2.3  --  2.2  --   PHOS 3.2  --   --   --    GFR: Estimated Creatinine Clearance: 69.1 mL/min (by C-G formula based on SCr of 0.82 mg/dL).  Liver Function Tests: Recent Labs  Lab  07/19/18 0408  AST 20  ALT 16  ALKPHOS 51  BILITOT 0.7  PROT 6.6  ALBUMIN 2.5*    HbA1C: Hgb A1c MFr Bld  Date/Time Value Ref Range Status  08/27/2016 10:20 AM 4.5 (L) 4.6 - 6.5 % Final    Comment:    Glycemic Control Guidelines for People with Diabetes:Non Diabetic:  <6%Goal of Therapy: <7%Additional Action Suggested:  >8%   10/15/2014 03:17 AM 4.7 <5.7 % Final    Comment:    (NOTE)                                                                       According to the ADA Clinical Practice Recommendations for 2011, when HbA1c is used as a screening test:  >=6.5%   Diagnostic of Diabetes Mellitus            (if abnormal result is confirmed) 5.7-6.4%   Increased risk of developing Diabetes Mellitus References:Diagnosis and Classification of Diabetes Mellitus,Diabetes WEXH,3716,96(VELFY 1):S62-S69 and Standards of Medical Care in         Diabetes - 2011,Diabetes BOFB,5102,58 (Suppl 1):S11-S61.     CBG: Recent Labs  Lab 07/17/18 2342 07/18/18 0418 07/18/18 0728 07/18/18 1116 07/18/18 1526  GLUCAP 97 91 108* 129* 169*    Recent Results (from the past 240 hour(s))  Expectorated sputum assessment w rflx to resp cult     Status: None   Collection Time: 07/14/18  3:52 PM  Result Value Ref Range Status   Specimen Description SPUTUM  Final   Special Requests NONE  Final   Sputum evaluation   Final    THIS SPECIMEN IS ACCEPTABLE FOR SPUTUM CULTURE Performed at [facility], [place] [address] [place], [place] [address]    Report Status 07/14/2018 FINAL  Final  Culture, respiratory     Status: None   Collection Time: 07/14/18  3:52 PM  Result Value Ref Range Status   Specimen Description SPUTUM  Final   Special Requests NONE Reflexed from [id number]  Final   Gram Stain   Final    ABUNDANT WBC PRESENT, PREDOMINANTLY PMN NO ORGANISMS SEEN Performed at [facility], [place] [address] [place], [place] [address]    Culture RARE CANDIDA ALBICANS  Final   Report Status 07/17/2018 FINAL  Final     Scheduled Meds: . aspirin  81 mg Oral Daily  . Chlorhexidine Gluconate Cloth  6 each Topical Daily  . cholecalciferol  1,000 Units Oral Daily  . clonazepam  0.5 mg Oral Daily  . enoxaparin (LOVENOX) injection  40 mg Subcutaneous Daily  . folic acid  1 mg Oral Daily  . gemfibrozil  600 mg Oral BID AC  . ipratropium-albuterol  3 mL Nebulization TID  . levETIRAcetam  500 mg Oral BID  . mouth rinse  15 mL Mouth Rinse BID  . nicotine  21 mg Transdermal Daily  . pantoprazole  40 mg Oral Q1200  . predniSONE  10 mg Oral Q breakfast  . QUEtiapine  50 mg Oral BID  . sodium chloride flush   10-40 mL Intracatheter Q12H  . thiamine  100 mg Oral Daily     LOS: 16 days   Phillips Climes, MD [facility] Office  [phone] Pager - Text Page per Shea Evans  If 7PM-7AM, please contact night-coverage per Amion 07/22/2018, 1:36 PM

## 2018-07-22 NOTE — Progress Notes (Addendum)
     [facility] Progress Note   Chief Complaint:   dsyphagia   SUBJECTIVE:   No complaints. Doesn't want to wake up right now. Hasn't tried to eat since dilation   ASSESSMENT AND PLAN:   Dysphagia. EGD yesterday >> benign appearing intrinsic moderate stenosis measuring 1 cm in length found 37 cm from the incisors.  Dilation with endoscope only. -sleeping and doesn't want to wake up / sit up. Says he hasn't tried to eat since dilation. Breakfast has not yet been delivered. Spoke with nurse and she will encourage him to eat.  -Probably needs repeat upper endoscopy in approximately 8 weeks for retreatment and further dilation.  Follow-up appointment already scheduled for 11/22 at 1:30pm  Addendum : just spoke with RN, patient consume 100% of full liquid diet. Asking for more. Will advance to soft diet  Hx of ETOH abuse, none in months  HCAP, reason for admission   OBJECTIVE:     Vital signs in last 24 hours: Temp:  [97.5 F (36.4 C)-98 F (36.7 C)] 98 F (36.7 C) (10/26 0653) Pulse Rate:  [70-87] 77 (10/26 0653) Resp:  [10-21] 18 (10/26 0653) BP: (90-141)/(44-84) 141/82 (10/26 0653) SpO2:  [92 %-98 %] 95 % (10/26 0653) Last BM Date: 07/21/18 General:   Lying on his side in bed. Doesn't want to be disturbed EENT:  Normal hearing Heart:  Regular rate and rhythm Pulm: Normal respiratory effort Abdomen:  Soft, nondistended, nontender.  Normal bowel sounds       BMET Recent Labs    07/21/18 0353  NA 138  K 3.6  CL 104  CO2 25  GLUCOSE 106*  BUN 17  CREATININE 0.82  CALCIUM 8.5*       LOS: 16 days   Tye Savoy ,NP 07/22/2018, 9:22 AM

## 2018-07-23 MED ORDER — IPRATROPIUM-ALBUTEROL 0.5-2.5 (3) MG/3ML IN SOLN: 3.0000 mL | Freq: Four times a day (QID) | RESPIRATORY_TRACT | Status: DC | PRN

## 2018-07-23 NOTE — Progress Notes (Signed)
Blake [name]  WUJ:[id number] DOB: 1940/10/20 DOA: 07/06/2018 PCP: Hendricks Limes, MD    Brief Narrative:  77 y/o male w/ a hx of CVA, EtOH abuse, GERD, and COPD who presented from a SNF with HCAP and was intubated for acute respiratory failure with hypoxemia.    Significant Events: 10/10 admit 10/11 transfer to ICU for intubation 10/15 precedex > brady 20's on 0.6 (avoid precedex) 10/17 added seroquel / klonopin 10/18 bronchoscopy - thick secretions lower lobes  10/19 weaned PS 5/5 x 30 mis but then tachypenic 10/20 extubated 10/25 EGD >> benign appearing intrinsic moderate stenosis measuring 1 cm in length found 37 cm from the incisors.  Dilation with endoscope only  Subjective:  He reports night sleep, denies any problems swallowing, he remains on clear liquid diet .  Assessment & Plan:  Aspiration pneumonia Completed a 10 day course of Zosyn - no evidence of active infection at this time   Esophageal dysphagia  DG esophagus noted stricture - EGD 10/25 noted benign esophageal stenosis which was dilated w/ the scope - will need repeat EGD in 8 weeks to f/u and dilate again -tolerating clear liquid diet, I will advance to soft diet today  COPD titrating off steroids - continue scheduled duoneb - clinically improving   Acute pulmonary edema Wean O2 as able - clinically resolved   History of stroke   Hx of Seizures Likely related to EtOH withdrawal   Acute metabolic encephalopathy continue seroquel + prn haldol  EtOH abuse Thiamine/folate continue   DVT prophylaxis: lovenox  Code Status: FULL CODE Family Communication: no family present at time of exam  Disposition Plan: SNF tomorrow if tolerates soft diet Consultants:  PCCM GI Antimicrobials:  Zosyn 10/10 > 10/20  Objective: Blood pressure (!) 177/94, pulse (!) 53, temperature 97.7 F (36.5 C), temperature source Oral, resp. rate 16, height 5\' 11"  (1.803 m), weight 64.8 kg, SpO2 92  %.  Intake/Output Summary (Last 24 hours) at 07/23/2018 1307 Last data filed at 07/23/2018 1100 Gross per 24 hour  Intake 200 ml  Output 475 ml  Net -275 ml   Filed Weights   07/16/18 0455 07/17/18 0341 07/18/18 1611  Weight: 76.4 kg 64.9 kg 64.8 kg    Examination:  Awake Alert, pleasant, laying in bed in no apparent distress Symmetrical Chest wall movement, Good air movement bilaterally, CTAB RRR,No Gallops,Rubs or new Murmurs, No Parasternal Heave +ve B.Sounds, Abd Soft, No tenderness, No rebound - guarding or rigidity. No Cyanosis, Clubbing or edema, No new Rash or bruise      CBC: Recent Labs  Lab 07/17/18 0331 07/19/18 0408  WBC 12.5* 9.2  HGB 11.8* 13.0  HCT 37.5* 41.6  MCV 106.5* 103.7*  PLT 511* 956*   Basic Metabolic Panel: Recent Labs  Lab 07/17/18 0331 07/18/18 0945 07/19/18 0408 07/21/18 0353  NA 146* 143 142 138  K 3.2* 3.1* 3.0* 3.6  CL 104 106 105 104  CO2 34* 29 27 25   GLUCOSE 122* 107* 87 106*  BUN 26* 24* 22 17  CREATININE 0.89 0.78 0.82 0.82  CALCIUM 8.7* 8.7* 8.8* 8.5*  MG 2.3  --  2.2  --   PHOS 3.2  --   --   --    GFR: Estimated Creatinine Clearance: 69.1 mL/min (by C-G formula based on SCr of 0.82 mg/dL).  Liver Function Tests: Recent Labs  Lab 07/19/18 0408  AST 20  ALT 16  ALKPHOS 51  BILITOT 0.7  PROT  6.6  ALBUMIN 2.5*    HbA1C: Hgb A1c MFr Bld  Date/Time Value Ref Range Status  08/27/2016 10:20 AM 4.5 (L) 4.6 - 6.5 % Final    Comment:    Glycemic Control Guidelines for People with Diabetes:Non Diabetic:  <6%Goal of Therapy: <7%Additional Action Suggested:  >8%   10/15/2014 03:17 AM 4.7 <5.7 % Final    Comment:    (NOTE)                                                                       According to the ADA Clinical Practice Recommendations for 2011, when HbA1c is used as a screening test:  >=6.5%   Diagnostic of Diabetes Mellitus           (if abnormal result is confirmed) 5.7-6.4%   Increased risk of  developing Diabetes Mellitus References:Diagnosis and Classification of Diabetes Mellitus,Diabetes ZDGL,8756,43(PIRJJ 1):S62-S69 and Standards of Medical Care in         Diabetes - 2011,Diabetes OACZ,6606,30 (Suppl 1):S11-S61.     CBG: Recent Labs  Lab 07/17/18 2342 07/18/18 0418 07/18/18 0728 07/18/18 1116 07/18/18 1526  GLUCAP 97 91 108* 129* 169*    Recent Results (from the past 240 hour(s))  Expectorated sputum assessment w rflx to resp cult     Status: None   Collection Time: 07/14/18  3:52 PM  Result Value Ref Range Status   Specimen Description SPUTUM  Final   Special Requests NONE  Final   Sputum evaluation   Final    THIS SPECIMEN IS ACCEPTABLE FOR SPUTUM CULTURE Performed at [facility], [place] [address] [place], [place] [address]    Report Status 07/14/2018 FINAL  Final  Culture, respiratory     Status: None   Collection Time: 07/14/18  3:52 PM  Result Value Ref Range Status   Specimen Description SPUTUM  Final   Special Requests NONE Reflexed from [id number]  Final   Gram Stain   Final    ABUNDANT WBC PRESENT, PREDOMINANTLY PMN NO ORGANISMS SEEN Performed at [facility], [place] [address], [place], [place] [address]    Culture RARE CANDIDA ALBICANS  Final   Report Status 07/17/2018 FINAL  Final     Scheduled Meds: . aspirin  81 mg Oral Daily  . Chlorhexidine Gluconate Cloth  6 each Topical Daily  . cholecalciferol  1,000 Units Oral Daily  . clonazepam  0.5 mg Oral Daily  . enoxaparin (LOVENOX) injection  40 mg Subcutaneous Daily  . folic acid  1 mg Oral Daily  . gemfibrozil  600 mg Oral BID AC  . levETIRAcetam  500 mg Oral BID  . mouth rinse  15 mL Mouth Rinse BID  . nicotine  21 mg Transdermal Daily  . pantoprazole  40 mg Oral Q1200  . predniSONE  10 mg Oral Q breakfast  . QUEtiapine  50 mg Oral BID  . sodium chloride flush  10-40 mL Intracatheter Q12H  . thiamine  100 mg Oral Daily     LOS: 1 days   Phillips Climes, MD [facility] Office  [phone] Pager - Text Page per Shea Evans  If 7PM-7AM, please contact night-coverage per Amion 07/23/2018, 1:07 PM

## 2018-07-24 ENCOUNTER — Encounter (HOSPITAL_COMMUNITY): Payer: Self-pay | Admitting: Gastroenterology

## 2018-07-24 MED ORDER — QUETIAPINE FUMARATE 50 MG PO TABS: 25.0000 mg | ORAL_TABLET | Freq: Every day | ORAL | Status: DC

## 2018-07-24 NOTE — Discharge Instructions (Signed)
Follow with Primary MD Hendricks Limes, MD in 7 days   Get CBC, CMP,checked  by Primary MD next visit.    Activity: As tolerated with Full fall precautions use walker/cane & assistance as needed   Disposition Home    Diet: Pured diet with thin liquid, he can be advanced to soft diet once he has dentures .  On your next visit with your primary care physician please Get Medicines reviewed and adjusted.   Please request your Prim.MD to go over all Hospital Tests and Procedure/Radiological results at the follow up, please get all Hospital records sent to your Prim MD by signing hospital release before you go home.   If you experience worsening of your admission symptoms, develop shortness of breath, life threatening emergency, suicidal or homicidal thoughts you must seek medical attention immediately by calling 911 or calling your MD immediately  if symptoms less severe.  You Must read complete instructions/literature along with all the possible adverse reactions/side effects for all the Medicines you take and that have been prescribed to you. Take any new Medicines after you have completely understood and accpet all the possible adverse reactions/side effects.   Do not drive, operating heavy machinery, perform activities at heights, swimming or participation in water activities or provide baby sitting services if your were admitted for syncope or siezures until you have seen by Primary MD or a Neurologist and advised to do so again.  Do not drive when taking Pain medications.    Do not take more than prescribed Pain, Sleep and Anxiety Medications  Special Instructions: If you have smoked or chewed Tobacco  in the last 2 yrs please stop smoking, stop any regular Alcohol  and or any Recreational drug use.  Wear Seat belts while driving.   Please note  You were cared for by a hospitalist during your hospital stay. If you have any questions about your discharge medications or the  care you received while you were in the hospital after you are discharged, you can call the unit and asked to speak with the hospitalist on call if the hospitalist that took care of you is not available. Once you are discharged, your primary care physician will handle any further medical issues. Please note that NO REFILLS for any discharge medications will be authorized once you are discharged, as it is imperative that you return to your primary care physician (or establish a relationship with a primary care physician if you do not have one) for your aftercare needs so that they can reassess your need for medications and monitor your lab values.

## 2018-07-24 NOTE — Discharge Summary (Addendum)
Blake [name], is a 77 y.o. male  DOB 1941/02/21  MRN [id number].  Admission date:  07/06/2018  Admitting Physician  Shela Leff, MD  Discharge Date:  07/24/2018   Primary MD  Hendricks Limes, MD  Recommendations for primary care physician for things to follow:  -Please check CBC, BMP 3 days -Pured diet with thin liquid, can be advanced to soft diet once he has dentures. -Follow with GI as an outpatient, please see appointment below   Admission Diagnosis  Pneumonia of left lower lobe due to infectious organism [facility] [J18.1]   Discharge Diagnosis  Pneumonia of left lower lobe due to infectious organism [facility]) [J18.1]   Principal Problem:   Aspiration pneumonia ([place]) Active Problems:   Seizure disorder ([place])   HLD (hyperlipidemia)   Acute hypoxemic respiratory failure ([place])   Encounter for intubation   Acute respiratory failure with hypercapnia ([place])   HCAP (healthcare-associated pneumonia)   Malnutrition of moderate degree   Dysphagia   Abnormal finding on GI tract imaging   Esophageal stricture      Past Medical History:  Diagnosis Date  . Chronic insomnia   . Cigarette smoker   . COPD (chronic obstructive pulmonary disease) ([place])   . Diverticulosis of colon   . Elevated prostate specific antigen (PSA)   . ETOH abuse   . GERD (gastroesophageal reflux disease)   . Hx of colonic polyps   . Hyperlipidemia   . Hypertension   . Pulmonary nodule   . Shortness of breath   . Stroke [facility])     Past Surgical History:  Procedure Laterality Date  . COLONOSCOPY    . ESOPHAGOGASTRODUODENOSCOPY (EGD) WITH PROPOFOL N/A 07/21/2018   Procedure: ESOPHAGOGASTRODUODENOSCOPY (EGD) WITH PROPOFOL;  Surgeon: Lavena Bullion, DO;  Location: [facility];  Service: Gastroenterology;  Laterality: N/A;  . needle biopsy RUL nodule  01/2009   benign  . TONSILLECTOMY  1947  . VASECTOMY          History of present illness and  Hospital Course:     Kindly see H&P for history of present illness and admission details, please review complete Labs, Consult reports and Test reports for all details in brief  HPI  from the history and physical done on the day of admission 07/06/2018  HPI: Blake [name] is a 77 y.o. male with medical history significant of COPD, GERD, CVA, hypertension, hyperlipidemia presenting to the hospital from [place] home for further evaluation of worsening productive cough and shortness of breath noticed at the facility.  Staff at the facility were unable to provide the ED provider with any details about when the symptoms started.  No fevers were noted in the facility.  Patient has a history of prior stroke and is unable to communicate.  Per [employer] paperwork, he is alert and disoriented at baseline.  No history could be obtained from him.  Per chart review, patient was seen at [facility] today and noted to have O2 saturation in the 80s earlier  this morning.  Chest x-ray revealed left lower lobe pneumonia.  He was noted to have confusion and was seen on the floor in the afternoon.  He was able to move all of his extremities without difficulty.  She was started on Rocephin and DuoNeb's at the PCPs office.    Patient was seen and examined at bedside.  He was awake and alert.  Oriented to self.  Satting well on 6 L of oxygen via nasal cannula.  ED Course: Tachycardic (pulse 110) and hypertensive blood pressure (157/81) on arrival.  Satting well on 6 L of supplemental oxygen via nasal cannula.  He was noted to have large amounts of oropharyngeal secretions in the ED which were suctioned.  Labs showing white count 33.3.  Lactic acid 2.1. Kidney function at baseline.  Chest x-ray showing left lower lobe infiltrate. TRH paged to admit.    Hospital Course   77 y/o male w/ a hx of CVA, EtOH abuse, GERD, and COPD who presented from a SNF with HCAP  and was intubated for acute respiratory failure with hypoxemia.  Significant Events: 10/10 admit 10/11 transfer to ICU for intubation 10/15 precedex > brady 20's on 0.6 (avoid precedex) 10/18 bronchoscopy - thick secretions lower lobes  10/19 weaned PS 5/5 x 30 mis but then tachypenic 10/20 extubated 10/25 EGD >> benign appearingintrinsic moderate stenosis measuring 1 cm in length found 37 cm from the incisors. Dilation with endoscope only   Aspiration pneumonia Completed a 10 day course of Zosyn - no evidence of active infection at this time   Esophageal dysphagia  DG esophagus noted stricture - EGD 10/25 noted benign esophageal stenosis which was dilated w/ the scope - will need repeat EGD in 8 weeks to f/u and dilate again -tolerating clear liquid diet, he is tolerating soft diet, but his main issue is he currently has no dentures, so he will be discharged on pured diet, to be advanced to soft when his dentures are found  COPD Treated with steroids, no further prednisone on discharge g   Acute pulmonary edema Wean O2 as able - clinically resolved   History of stroke  Continue with aspirin on discharge  Hx of Seizures Likely related to EtOH withdrawal, continue with home meds  Acute metabolic encephalopathy continue seroquel + prn haldol  EtOH abuse Thiamine/folate continue   Discharge Condition:  Stable   Follow UP  Follow-up Information    Cirigliano, Vito V, DO Follow up on 08/18/2018.   Specialty:  Gastroenterology Why:  1:30 PM follow up with gastro MD.   Contact information: [place] [phone]             Discharge Instructions  and  Discharge Medications     Discharge Instructions    Discharge instructions   Complete by:  As directed    Follow with Primary MD Hendricks Limes, MD in 7 days   Get CBC, CMP,checked  by Primary MD next visit.    Activity: As tolerated with Full fall precautions use  walker/cane & assistance as needed   Disposition Home    Diet: Pured diet with thin liquid, he can be advanced to soft diet once he has dentures .  On your next visit with your primary care physician please Get Medicines reviewed and adjusted.   Please request your Prim.MD to go over all Hospital Tests and Procedure/Radiological results at the follow up, please get all Hospital records sent to your Prim MD by  signing hospital release before you go home.   If you experience worsening of your admission symptoms, develop shortness of breath, life threatening emergency, suicidal or homicidal thoughts you must seek medical attention immediately by calling 911 or calling your MD immediately  if symptoms less severe.  You Must read complete instructions/literature along with all the possible adverse reactions/side effects for all the Medicines you take and that have been prescribed to you. Take any new Medicines after you have completely understood and accpet all the possible adverse reactions/side effects.   Do not drive, operating heavy machinery, perform activities at heights, swimming or participation in water activities or provide baby sitting services if your were admitted for syncope or siezures until you have seen by Primary MD or a Neurologist and advised to do so again.  Do not drive when taking Pain medications.    Do not take more than prescribed Pain, Sleep and Anxiety Medications  Special Instructions: If you have smoked or chewed Tobacco  in the last 2 yrs please stop smoking, stop any regular Alcohol  and or any Recreational drug use.  Wear Seat belts while driving.   Please note  You were cared for by a hospitalist during your hospital stay. If you have any questions about your discharge medications or the care you received while you were in the hospital after you are discharged, you can call the unit and asked to speak with the hospitalist on call if the hospitalist that  took care of you is not available. Once you are discharged, your primary care physician will handle any further medical issues. Please note that NO REFILLS for any discharge medications will be authorized once you are discharged, as it is imperative that you return to your primary care physician (or establish a relationship with a primary care physician if you do not have one) for your aftercare needs so that they can reassess your need for medications and monitor your lab values.   Increase activity slowly   Complete by:  As directed      Allergies as of 07/24/2018   No Known Allergies     Medication List    STOP taking these medications   cefTRIAXone 1 g injection Commonly known as:  ROCEPHIN   nystatin 100000 UNIT/ML suspension Commonly known as:  MYCOSTATIN   saccharomyces boulardii 250 MG capsule Commonly known as:  FLORASTOR     TAKE these medications   aspirin 81 MG chewable tablet Chew 81 mg by mouth daily. What changed:  Another medication with the same name was removed. Continue taking this medication, and follow the directions you see here.   bisacodyl 10 MG suppository Commonly known as:  DULCOLAX Place 10 mg rectally once as needed (for constipation not relieved by Milk of Magnesia).   chlordiazePOXIDE 5 MG capsule Commonly known as:  LIBRIUM Please dispense 18 pills - Take 1 pill three times a day for 3 days, then Take 1 pill two times a day for 3 days, then Take 1 pill once a day for 3 days and stop. What changed:    how much to take  how to take this  when to take this  additional instructions   Vitamin D-3 1000 units Caps Take 1,000 Units by mouth daily.   cholecalciferol 1000 units tablet Commonly known as:  VITAMIN D Take 1 tablet (1,000 Units total) by mouth daily.   folic acid 1 MG tablet Commonly known as:  FOLVITE Take 1 tablet (1 mg  total) by mouth daily.   gemfibrozil 600 MG tablet Commonly known as:  LOPID Take 1 tablet (600 mg total)  by mouth 2 (two) times daily.   guaiFENesin 100 MG/5ML liquid Commonly known as:  ROBITUSSIN Take 200 mg by mouth every 4 (four) hours as needed for cough. x48 hours   ipratropium-albuterol 0.5-2.5 (3) MG/3ML Soln Commonly known as:  DUONEB Take 3 mLs by nebulization See admin instructions. Nebulize and inhale 3 ml's into the lungs at 6 AM, 2 PM, and 10 PM for 5 days with a start date of 07/06/18 AND 3 ml's every six hours as needed for coughing   levETIRAcetam 500 MG tablet Commonly known as:  KEPPRA Take 1 tablet (500 mg total) by mouth 2 (two) times daily. What changed:  when to take this   magnesium hydroxide 400 MG/5ML suspension Commonly known as:  MILK OF MAGNESIA Take 30 mLs by mouth once as needed for mild constipation.   nicotine 21 mg/24hr patch Commonly known as:  NICODERM CQ - dosed in mg/24 hours Place 21 mg onto the skin daily.   NUTRITIONAL SUPPLEMENT PLUS Liqd Take 120 mLs by mouth See admin instructions. MedPass: Drink 120 ml's by mouth two times a day   QUEtiapine 50 MG tablet Commonly known as:  SEROQUEL Take 0.5 tablets (25 mg total) by mouth at bedtime.   RA SALINE ENEMA 19-7 GM/118ML Enem Place 1 enema rectally once as needed (for constipation not relieved by Dulcolax suppository and call MD if no relief from enema).   thiamine 100 MG tablet Take 1 tablet (100 mg total) by mouth daily.         Diet and Activity recommendation: See Discharge Instructions above   Consults obtained -   PCCM GI  Major procedures and Radiology Reports - PLEASE review detailed and final reports for all details, in brief -   10/11  intubation 10/18 bronchoscopy - thick secretions lower lobes  10/20 extubated 10/25 EGD >> benign appearingintrinsic moderate stenosis measuring 1 cm in length found 37 cm from the incisors. Dilation with endoscope only    Dg Chest 2 View  Result Date: 06/25/2018 CLINICAL DATA:  Cough EXAM: CHEST - 2 VIEW COMPARISON:   10/14/2014 FINDINGS: There is no focal parenchymal opacity. There is no pleural effusion or pneumothorax. The heart and mediastinal contours are unremarkable. There is a healed old posttraumatic deformity of left proximal humerus. There is an age-indeterminate L1 vertebral body compression fracture. IMPRESSION: No active cardiopulmonary disease. Electronically Signed   By: Kathreen Devoid   On: 06/25/2018 16:31   Ct Head Wo Contrast  Result Date: 06/25/2018 CLINICAL DATA:  Left-sided weakness and altered mental status EXAM: CT HEAD WITHOUT CONTRAST TECHNIQUE: Contiguous axial images were obtained from the base of the skull through the vertex without intravenous contrast. COMPARISON:  January 07, 2018 FINDINGS: Brain: Moderate diffuse atrophy is stable. There is no intracranial mass, hemorrhage, extra-axial fluid collection, or midline shift. There is patchy small vessel disease in the centra semiovale bilaterally. No new brain parenchymal lesions evident. No acute infarct is appreciable. Vascular: No hyperdense vessels evident. There is calcification in each carotid siphon region. Skull: Bony calvarium appears intact. Sinuses/Orbits: There is a small retention cyst in the inferomedial left maxillary antrum. There is mucosal thickening in several ethmoid air cells bilaterally. Postoperative changes are noted in the left lateral orbital wall and anterior left maxillary antrum. Orbits appear symmetric bilaterally. There is evidence of previous cataract removals bilaterally. Other: Mastoid  air cells are clear. There is debris in each external auditory canal. IMPRESSION: Atrophy with patchy periventricular small vessel disease. No evident acute infarct. No mass or hemorrhage. There are foci of arterial vascular calcification. Foci of paranasal sinus disease noted with areas of postoperative change in the left facial region. There is probable cerumen in each external auditory canal. Electronically Signed   By: Lowella Grip III M.D.   On: 06/25/2018 16:01   Mr Brain Wo Contrast  Result Date: 06/27/2018 CLINICAL DATA:  77 year old male with left side weakness and altered mental status. EXAM: MRI HEAD WITHOUT CONTRAST TECHNIQUE: Multiplanar, multiecho pulse sequences of the brain and surrounding structures were obtained without intravenous contrast. COMPARISON:  Head CT 06/25/2018.  Brain MRI 10/15/2014. FINDINGS: Brain: Stable cerebral volume since the prior MRI. No restricted diffusion to suggest acute infarction. No midline shift, mass effect, evidence of mass lesion, ventriculomegaly, extra-axial collection or acute intracranial hemorrhage. Cervicomedullary junction and pituitary are within normal limits. Stable gray-white matter differentiation throughout the brain. Prominent mineralization of the bilateral basal ganglia. Mild for age patchy cerebral white matter T2 and FLAIR hyperintensity. No cortical encephalomalacia or definite chronic cerebral blood products identified. No new signal abnormality. Vascular: Major intracranial vascular flow voids are stable since 2016. Skull and upper cervical spine: Chronic ligamentous hypertrophy about the odontoid. Normal visible bone marrow signal. Sinuses/Orbits: Stable and negative. Other: Mastoid air cells remain clear. Visible internal auditory structures appear normal. Scalp and face soft tissues appear negative. IMPRESSION: No acute intracranial abnormality and stable MRI appearance of the brain since 2016. Electronically Signed   By: Genevie Ann M.D.   On: 06/27/2018 08:24   Dg Esophagus  Result Date: 07/19/2018 CLINICAL DATA:  Patient underwent a swallowing function study yesterday. Noted to have significant esophageal residual. Current exam ordered for further assessment. History of stroke. EXAM: ESOPHOGRAM/BARIUM SWALLOW TECHNIQUE: Single contrast examination was performed using  thin barium. FLUOROSCOPY TIME:  Fluoroscopy Time:  1 minutes and 12 seconds Radiation  Exposure Index (if provided by the fluoroscopic device): 29 Number of Acquired Spot Images: 596 micro Gy per meter squared COMPARISON:  Prior chest radiographs. FINDINGS: Esophagus is normal in course and caliber.  No esophageal masses. There is narrowing of the distal esophagus above a small hiatal hernia. Narrowing extends for approximately 17 mm in length. This portion of the esophagus dilates to a maximum of 6-7 mm. No other a soft joint narrowing. No evidence of active inflammation or ulceration. There is significant dysmotility with diffuse tertiary contractions from the mid through the distal esophagus leading to significant barium stasis. No reflux was documented during the exam. IMPRESSION: 1. Distal esophageal stricture extending for a length of approximately 17 mm, narrowing the esophagus to approximately 6-7 mm. This has the appearance of a benign stricture. 2. Significant esophageal dysmotility with tertiary contractions and barium stasis. Electronically Signed   By: Lajean Manes M.D.   On: 07/19/2018 09:23   Dg Chest Port 1 View  Result Date: 07/17/2018 CLINICAL DATA:  Respiratory failure EXAM: PORTABLE CHEST 1 VIEW COMPARISON:  July 15, 2018 FINDINGS: Central catheter tip is in the superior vena cava. No pneumothorax. There is persistent small left pleural effusion with consolidation in the left base. Right lung is clear. Note that there is overall mild hyperexpansion. Heart size and pulmonary vascularity are normal. There is aortic atherosclerosis. Bones are osteoporotic. There is evidence of old rib trauma on the left. IMPRESSION: Central catheter tip in superior vena cava. No  pneumothorax. Consolidation left base with small left pleural effusion, stable. No new opacity. Underlying hyperexpansion. Stable cardiac silhouette. There is aortic atherosclerosis. Aortic Atherosclerosis (ICD10-I70.0). Electronically Signed   By: Lowella Grip III M.D.   On: 07/17/2018 07:17   Dg Chest Port 1  View  Result Date: 07/15/2018 CLINICAL DATA:  Acute respiratory failure. EXAM: PORTABLE CHEST 1 VIEW COMPARISON:  07/14/2018 FINDINGS: The heart size is normal. Endotracheal tube is in satisfactory position. NG tube courses off the inferior border of the film. Mild edema is again seen. Progressive left basilar airspace opacity is noted. More mild right basilar opacities are noted. The visualized soft tissues and bony thorax are unremarkable. IMPRESSION: 1. Progressive left basilar airspace disease worrisome for pneumonia. 2. Mild edema unchanged. 3. Support apparatus stable. Electronically Signed   By: San Morelle M.D.   On: 07/15/2018 07:20   Dg Chest Port 1 View  Result Date: 07/14/2018 CLINICAL DATA:  77 year old male with left lower lobe pneumonia. Intubated. EXAM: PORTABLE CHEST 1 VIEW COMPARISON:  07/13/2018 and earlier. FINDINGS: Portable AP semi upright view at 0422 hours. Stable endotracheal tube tip at the level the clavicles. Enteric tube courses to the abdomen, tip not included. Stable PICC line. Stable cardiac size and mediastinal contours. Stable lung volumes. Veiling opacity at both lung bases, greater on the left. Dense retrocardiac opacity. No pneumothorax. Stable pulmonary vascularity without edema. Chronic appearing left upper rib fractures. Paucity of bowel gas in the visible abdomen. IMPRESSION: 1.  Stable lines and tubes. 2. Ventilation not significantly changed. Bilateral pleural effusions suspected probably greater on the left. Superimposed left lung base atelectasis or consolidation. Electronically Signed   By: Genevie Ann M.D.   On: 07/14/2018 07:12   Dg Chest Port 1 View  Result Date: 07/13/2018 CLINICAL DATA:  Acute respiratory failure EXAM: PORTABLE CHEST 1 VIEW COMPARISON:  07/12/2018 FINDINGS: Left lower lobe airspace disease unchanged. Mild right lower lobe airspace disease unchanged. Endotracheal tube in good position. Right arm PICC tip at the cavoatrial junction  unchanged. NG tube enters the stomach with the tip not visualized. IMPRESSION: Support lines remain in good position. No significant change in bibasilar consolidation left greater than right Electronically Signed   By: Franchot Gallo M.D.   On: 07/13/2018 07:51   Dg Chest Port 1 View  Result Date: 07/12/2018 CLINICAL DATA:  Acute respiratory failure. EXAM: PORTABLE CHEST 1 VIEW COMPARISON:  07/11/2018 FINDINGS: Endotracheal tube terminates 4.7 cm above the carina. Enteric tube courses into the left upper abdomen with tip not imaged. Right PICC terminates over the lower SVC. The lateral costophrenic angles were incompletely imaged. Left greater than right basilar opacities have not significantly changed with persistent left and possibly small right pleural effusions. No pneumothorax is identified. IMPRESSION: Unchanged left greater than right basilar airspace disease and pleural effusions. Electronically Signed   By: Logan Bores M.D.   On: 07/12/2018 10:01   Dg Chest Port 1 View  Result Date: 07/11/2018 CLINICAL DATA:  Acute respiratory failure. EXAM: PORTABLE CHEST 1 VIEW COMPARISON:  07/10/2018 FINDINGS: Endotracheal tube terminates 4 cm above the carina. Enteric tube courses into the left upper abdomen with tip not imaged. Right PICC is unchanged. Cardiomediastinal silhouette is unchanged. Left lung base opacity has not significantly changed and likely represents a combination of pleural effusion and consolidation. Hazy right basilar opacity appears slightly increased and may reflect in part a small veiling pleural effusion. No pneumothorax is identified. IMPRESSION: 1. Unchanged left basilar consolidation and left  pleural effusion. 2. Slightly increased right basilar opacity which may reflect a small pleural effusion and atelectasis or infectious infiltrate. Electronically Signed   By: Logan Bores M.D.   On: 07/11/2018 08:41   Dg Chest Port 1 View  Result Date: 07/10/2018 CLINICAL DATA:   77 year old male with pneumonia. EXAM: PORTABLE CHEST 1 VIEW COMPARISON:  07/09/2018. FINDINGS: Endotracheal tube tip 4.1 cm above the carina. Right central line tip distal superior vena cava. No pneumothorax. Heart size within normal limits. Persistent consolidation left lung base which may represent infiltrate or atelectasis with associated pleural effusion. Limited for evaluating for underlying mass. Recommend follow-up until clearance. Calcified tortuous aorta. Remote left-sided rib fractures. IMPRESSION: 1. Persistent left lower lobe consolidation which may represent infiltrate or atelectasis with associated pleural effusion. Follow-up until clearance recommended. 2.  Aortic Atherosclerosis (ICD10-I70.0). Electronically Signed   By: Genia Del M.D.   On: 07/10/2018 07:22   Dg Chest Port 1 View  Result Date: 07/09/2018 CLINICAL DATA:  Follow-up pneumonia EXAM: PORTABLE CHEST 1 VIEW COMPARISON:  07/08/2018 FINDINGS: Cardiac shadow is stable. Endotracheal tube and nasogastric catheter are again seen in satisfactory position. Persistent left basilar infiltrate and associated effusion is noted. No bony abnormality is noted. IMPRESSION: Persistent left basilar effusion and infiltrate. Electronically Signed   By: Inez Catalina M.D.   On: 07/09/2018 08:28   Portable Chest Xray  Result Date: 07/08/2018 CLINICAL DATA:  Check endotracheal tube placement EXAM: PORTABLE CHEST 1 VIEW COMPARISON:  07/07/2018 FINDINGS: Cardiac shadow is within normal limits. Endotracheal tube and nasogastric catheter are again seen and stable. The lungs are well aerated bilaterally. Persistent left basilar infiltrate is seen. The right lung demonstrates mild basilar atelectasis. No pneumothorax is noted. Small left pleural effusion is noted. Multiple rib fractures are noted on the left. IMPRESSION: Stable left basilar infiltrate Tubes and lines in satisfactory position. Electronically Signed   By: Inez Catalina M.D.   On: 07/08/2018  07:34   Dg Chest Port 1 View  Result Date: 07/07/2018 CLINICAL DATA:  Intubation EXAM: PORTABLE CHEST 1 VIEW COMPARISON:  07/06/2018 FINDINGS: Cardiac enlargement. No vascular congestion. Airspace infiltration in the left lower lung consistent with pneumonia or possibly aspiration. Probable left pleural effusion. Old bilateral rib fractures. Calcification of the aorta. An endotracheal tube is been placed with tip measuring 3.7 cm above the carina. Enteric tube tip is off the field of view but below the left hemidiaphragm. IMPRESSION: Appliances appear to be in satisfactory location. Cardiac enlargement. Infiltration and effusion in the left lower lung. Electronically Signed   By: Lucienne Capers M.D.   On: 07/07/2018 03:39   Dg Chest Portable 1 View  Result Date: 07/06/2018 CLINICAL DATA:  Pt arrives to ED per [facility] from [place] which he is there for rehab for seizures per paperwork. Pt arrives for productive cough and shortness of breath, unknown when it started. Per [facility] [place], pt is alert and disoriented at baseline EXAM: PORTABLE CHEST 1 VIEW COMPARISON:  06/25/2018 FINDINGS: Heart size is normal. There is patchy infiltrate within the LEFT LOWER lobe, having developed since the previous exam. Numerous old LEFT rib fractures. Deformity of the LEFT humerus. Heart size is normal. There is atherosclerotic calcification of the thoracic aorta. No pulmonary edema. IMPRESSION: LEFT LOWER lobe infiltrate consistent with infectious process. Followup PA and lateral chest X-ray is recommended in 3-4 weeks following trial of antibiotic therapy to ensure resolution and exclude underlying malignancy. Electronically Signed   By: Nolon Nations M.D.  On: 07/06/2018 19:54   Dg Swallowing Func-speech Pathology  Result Date: 07/17/2018 Objective Swallowing Evaluation: Type of Study: MBS-Modified Barium Swallow Study  Patient Details Name: BALIN VANDEGRIFT MRN: [id number] Date of Birth: 01-13-1941 Today's  Date: 07/17/2018 Time: SLP Start Time (ACUTE ONLY): 1330 -SLP Stop Time (ACUTE ONLY): 8469 SLP Time Calculation (min) (ACUTE ONLY): 35 min Past Medical History: Past Medical History: Diagnosis Date . Chronic insomnia  . Cigarette smoker  . COPD (chronic obstructive pulmonary disease) ([place])  . Diverticulosis of colon  . Elevated prostate specific antigen (PSA)  . ETOH abuse  . GERD (gastroesophageal reflux disease)  . Hx of colonic polyps  . Hyperlipidemia  . Hypertension  . Pulmonary nodule  . Shortness of breath  . Stroke [facility])  Past Surgical History: Past Surgical History: Procedure Laterality Date . COLONOSCOPY   . needle biopsy RUL nodule  01/2009  benign . TONSILLECTOMY  1947 . VASECTOMY   HPI: 77 year old smoker, nursing home resident admitted with left lower lobe HCAP and intubated for nine days for acute hypercarbic respiratory failure. He had recent admission 06/25/18 through 06/29/18 for seizures and EtOH withdrawal and was discharged to [facility] for rehab  Subjective: alert, cooperative, follows commands Assessment / Plan / Recommendation CHL IP CLINICAL IMPRESSIONS 07/17/2018 Clinical Impression Pt presents with a primary esophageal and secondary pharyngeal dysphagia.  Swallow response occurred at the  pyriforms for liquids, valleculae for purees, with adequate airway closure but reduced pharyngeal/base of tongue constriction, leading to diffuse residue throughout the pharynx for all consistencies.  After several boluses of barium, material was viewed to backflow from the esophagus into the pharynx.  A scan of the esophagus revealed barium residue throughout.  Remarkably, there was no aspiration despite consumption of at least four oz of thin liquid barium and 2 oz applesauce. (There was penetration of thin liquids, but it was ejected from larynx upon completion of swallow -Penetration-Aspiration Scale rating of 2).  After study ended, pt regurgitated copious quantities of barium into emesis bag.  Per Sept  2019 clinical swallow evaluation, pt demonstrated s/s of an esophageal dysphagia.  MBS results reveal what is likely a chronic disorder  For now, recommend starting with ice chips/sips of water; crush meds and give with applesauce.  SLP will follow for safety/diet advancement.  If clinical presentation does not improved in a few days, pt may need esophageal w/u. D/W pt, who has poor awareness, and RN.  SLP Visit Diagnosis Dysphagia, pharyngoesophageal phase (R13.14) Attention and concentration deficit following -- Frontal lobe and executive function deficit following -- Impact on safety and function Moderate aspiration risk   CHL IP TREATMENT RECOMMENDATION 07/17/2018 Treatment Recommendations Therapy as outlined in treatment plan below   Prognosis 07/17/2018 Prognosis for Safe Diet Advancement Good Barriers to Reach Goals -- Barriers/Prognosis Comment -- CHL IP DIET RECOMMENDATION 07/17/2018 SLP Diet Recommendations Other (Comment) Liquid Administration via Straw;Cup Medication Administration Crushed with puree Compensations Slow rate;Small sips/bites Postural Changes Remain semi-upright after after feeds/meals (Comment);Seated upright at 90 degrees   CHL IP OTHER RECOMMENDATIONS 07/17/2018 Recommended Consults Consider esophageal assessment Oral Care Recommendations -- Other Recommendations --   CHL IP FOLLOW UP RECOMMENDATIONS 07/17/2018 Follow up Recommendations Skilled Nursing facility   [facility] IP FREQUENCY AND DURATION 07/17/2018 Speech Therapy Frequency (ACUTE ONLY) min 3x week Treatment Duration 1 week      CHL IP ORAL PHASE 07/17/2018 Oral Phase WFL Oral - Pudding Teaspoon -- Oral - Pudding Cup -- Oral - Honey Teaspoon -- Oral -  Honey Cup -- Oral - Nectar Teaspoon -- Oral - Nectar Cup -- Oral - Nectar Straw -- Oral - Thin Teaspoon -- Oral - Thin Cup -- Oral - Thin Straw -- Oral - Puree -- Oral - Mech Soft -- Oral - Regular -- Oral - Multi-Consistency -- Oral - Pill -- Oral Phase - Comment --  CHL IP PHARYNGEAL  PHASE 07/17/2018 Pharyngeal Phase Impaired Pharyngeal- Pudding Teaspoon -- Pharyngeal -- Pharyngeal- Pudding Cup -- Pharyngeal -- Pharyngeal- Honey Teaspoon -- Pharyngeal -- Pharyngeal- Honey Cup -- Pharyngeal -- Pharyngeal- Nectar Teaspoon -- Pharyngeal -- Pharyngeal- Nectar Cup Delayed swallow initiation-vallecula;Reduced pharyngeal peristalsis;Reduced tongue base retraction;Pharyngeal residue - valleculae;Pharyngeal residue - pyriform Pharyngeal -- Pharyngeal- Nectar Straw -- Pharyngeal -- Pharyngeal- Thin Teaspoon -- Pharyngeal -- Pharyngeal- Thin Cup Delayed swallow initiation-pyriform sinuses;Reduced pharyngeal peristalsis;Reduced tongue base retraction;Pharyngeal residue - valleculae;Pharyngeal residue - pyriform;Penetration/Aspiration before swallow Pharyngeal Material enters airway, remains ABOVE vocal cords then ejected out Pharyngeal- Thin Straw Delayed swallow initiation-pyriform sinuses;Reduced pharyngeal peristalsis;Reduced tongue base retraction;Pharyngeal residue - valleculae;Pharyngeal residue - pyriform Pharyngeal -- Pharyngeal- Puree Delayed swallow initiation-vallecula;Reduced tongue base retraction;Pharyngeal residue - valleculae;Pharyngeal residue - pyriform Pharyngeal -- Pharyngeal- Mechanical Soft -- Pharyngeal -- Pharyngeal- Regular -- Pharyngeal -- Pharyngeal- Multi-consistency -- Pharyngeal -- Pharyngeal- Pill -- Pharyngeal -- Pharyngeal Comment --  CHL IP CERVICAL ESOPHAGEAL PHASE 07/17/2018 Cervical Esophageal Phase Impaired Pudding Teaspoon -- Pudding Cup -- Honey Teaspoon -- Honey Cup -- Nectar Teaspoon -- Nectar Cup -- Nectar Straw -- Thin Teaspoon -- Thin Cup -- Thin Straw -- Puree -- Mechanical Soft -- Regular -- Multi-consistency -- Pill -- Cervical Esophageal Comment -- Juan Quam Laurice 07/17/2018, 2:41 PM              [place] Ekg Site Rite  Result Date: 07/09/2018 If Site Rite image not attached, placement could not be confirmed due to current cardiac rhythm.   Micro  Results     Recent Results (from the past 240 hour(s))  Expectorated sputum assessment w rflx to resp cult     Status: None   Collection Time: 07/14/18  3:52 PM  Result Value Ref Range Status   Specimen Description SPUTUM  Final   Special Requests NONE  Final   Sputum evaluation   Final    THIS SPECIMEN IS ACCEPTABLE FOR SPUTUM CULTURE Performed at [facility], [address] [place], [place] [address]    Report Status 07/14/2018 FINAL  Final  Culture, respiratory     Status: None   Collection Time: 07/14/18  3:52 PM  Result Value Ref Range Status   Specimen Description SPUTUM  Final   Special Requests NONE Reflexed from [id number]  Final   Gram Stain   Final    ABUNDANT WBC PRESENT, PREDOMINANTLY PMN NO ORGANISMS SEEN Performed at [facility], [place] [address], [place], [place] [address]    Culture RARE CANDIDA ALBICANS  Final   Report Status 07/17/2018 FINAL  Final       Today   Subjective:   [name] today ports he had a good night sleep, denies any cough, chest pain, or dysphagia.   Objective:   Blood pressure 127/67, pulse 89, temperature 98 F (36.7 C), temperature source Oral, resp. rate 20, height 5\' 11"  (1.803 m), weight 64.8 kg, SpO2 91 %.   Intake/Output Summary (Last 24 hours) at 07/24/2018 1232 Last data filed at 07/24/2018 1001 Gross per 24 hour  Intake 120 ml  Output 925 ml  Net -805 ml  Exam  Awake Alert, pleasant, laying in bed in no apparent distress Symmetrical Chest wall movement, Good air movement bilaterally, CTAB RRR,No Gallops,Rubs or new Murmurs, No Parasternal Heave +ve B.Sounds, Abd Soft, No tenderness, No rebound - guarding or rigidity. No Cyanosis, Clubbing or edema, No new Rash or bruise    Data Review   CBC w Diff:  Lab Results  Component Value Date   WBC 9.2 07/19/2018   HGB 13.0 07/19/2018   HCT 41.6 07/19/2018   PLT 539 (H) 07/19/2018   LYMPHOPCT 4 07/06/2018   BANDSPCT 2 07/06/2018   MONOPCT 5  07/06/2018   EOSPCT 0 07/06/2018   BASOPCT 0 07/06/2018    CMP:  Lab Results  Component Value Date   NA 138 07/21/2018   K 3.6 07/21/2018   CL 104 07/21/2018   CO2 25 07/21/2018   BUN 17 07/21/2018   CREATININE 0.82 07/21/2018   PROT 6.6 07/19/2018   ALBUMIN 2.5 (L) 07/19/2018   BILITOT 0.7 07/19/2018   ALKPHOS 51 07/19/2018   AST 20 07/19/2018   ALT 16 07/19/2018  .   Total Time in preparing paper work, data evaluation and todays exam - 30 minutes  Phillips Climes M.D on 07/24/2018 at 12:32 PM  [facility]   Office  [phone]

## 2018-07-24 NOTE — Progress Notes (Signed)
  Speech Language Pathology Treatment: Dysphagia  Patient Details Name: Blake [name] MRN: [id number] DOB: 11-Jun-1941 Today's Date: 07/24/2018 Time: 1025-1039 SLP Time Calculation (min) (ACUTE ONLY): 14 min  Assessment / Plan / Recommendation Clinical Impression  Patient observed with breakfast tray. He had eaten most of tray when arrived. CNS was present and reported no coughing or emesis. His diet is currently soft, thin liquids.  Patient finished his french toast and water as I observed with no clearing of throat, coughing and with clear vocal quality. Recommend patient to continue current diet as he has historically not been aware of esophageal symptoms and the soft diet being so well tolerated. He continues to need supervision during meals for providing cues to eat slowly. No further speech therapy recommended will sign off.    HPI HPI: 76 year old smoker, nursing home resident admitted with left lower lobe HCAP and intubated for nine days for acute hypercarbic respiratory failure. He had recent admission 06/25/18 through 06/29/18 for seizures and EtOH withdrawal and was discharged to [facility] for rehab       SLP Plan  All goals met       Recommendations  Diet recommendations: Dysphagia 3 (mechanical soft);Thin liquid Liquids provided via: Cup;Straw Medication Administration: Crushed with puree Supervision: Full supervision/cueing for compensatory strategies Compensations: Slow rate;Small sips/bites;Follow solids with liquid Postural Changes and/or Swallow Maneuvers: Seated upright 90 degrees;Upright 30-60 min after meal                Oral Care Recommendations: Oral care QID Follow up Recommendations: Skilled Nursing facility SLP Visit Diagnosis: Dysphagia, pharyngoesophageal phase (R13.14) Plan: All goals met       [place], MA, CCC-SLP 07/24/2018 10:45 AM

## 2018-07-24 NOTE — Progress Notes (Addendum)
Patient transferred to 5W. CSW following for discharge needs.   Percell Locus Kelli Robeck LCSW [phone]

## 2018-07-24 NOTE — NC FL2 (Signed)
[place] LEVEL OF CARE SCREENING TOOL     IDENTIFICATION  Patient Name: Blake [name] Birthdate: Apr 28, 1941 Sex: male Admission Date (Current Location): 07/06/2018  [facility] and [place] Number:  [employer] and Address:  [facility], [place] [address], [place], [place] [address]      Provider Number: [id number]  Attending Physician Name and Address:  Albertine Patricia, MD  Relative Name and Phone Number:  Santiago Glad niece, [phone]    Current Level of Care: Hospital Recommended Level of Care: [place] Prior Approval Number:    Date Approved/Denied:   PASRR Number: [id number]  Discharge Plan: SNF    Current Diagnoses: Patient Active Problem List   Diagnosis Date Noted  . Esophageal stricture   . Abnormal finding on GI tract imaging   . Dysphagia 07/20/2018  . Malnutrition of moderate degree 07/10/2018  . Aspiration pneumonia ([place]) 07/07/2018  . Seizure disorder ([place]) 07/07/2018  . HLD (hyperlipidemia) 07/07/2018  . Acute hypoxemic respiratory failure ([place]) 07/07/2018  . Encounter for intubation   . Acute respiratory failure with hypercapnia ([place])   . HCAP (healthcare-associated pneumonia)   . Protein-calorie malnutrition, severe 06/26/2018  . Alcohol withdrawal seizure ([place]) 06/25/2018  . New onset seizure ([place]) 01/07/2018  . Hypertension 01/07/2018  . GERD (gastroesophageal reflux disease) 01/07/2018  . Hyperlipidemia 01/07/2018  . Cigarette smoker 01/07/2018  . Alcohol abuse 01/07/2018  . Acute alcohol abuse, with delirium ([place])   . Acute encephalopathy 10/14/2014  . Unsteady gait 10/14/2014  . Alcohol withdrawal delirium ([place]) 10/14/2014  . Lipoma of neck 12/22/2012  . Cerebral atrophy ([place]) 09/28/2012  . Cerebrovascular small vessel disease 09/28/2012  . Left humeral fracture 07/04/2012  . Delirium 07/04/2012  . ETOH abuse   . ELEVATED PROSTATE SPECIFIC ANTIGEN 09/30/2010  . COPD (chronic  obstructive pulmonary disease) ([place]) 01/28/2009  . PULMONARY NODULE 01/28/2009  . INSOMNIA, CHRONIC 10/15/2007  . COLONIC POLYPS 10/10/2007  . Type 2 diabetes mellitus ([place]) 10/10/2007  . Dyslipidemia 10/10/2007  . Essential hypertension 10/10/2007  . GERD 10/10/2007  . DIVERTICULOSIS OF COLON 10/10/2007    Orientation RESPIRATION BLADDER Height & Weight     Self, Time, Situation, Place  Normal Continent Weight: 64.8 kg Height:  5\' 11"  (180.3 cm)  BEHAVIORAL SYMPTOMS/MOOD NEUROLOGICAL BOWEL NUTRITION STATUS      Continent Diet(Please see DC Summary)  AMBULATORY STATUS COMMUNICATION OF NEEDS Skin   Extensive Assist Verbally Normal                       Personal Care Assistance Level of Assistance  Bathing, Feeding, Dressing Bathing Assistance: Maximum assistance Feeding assistance: Independent Dressing Assistance: Limited assistance     Functional Limitations Info  Sight, Hearing, Speech Sight Info: Impaired Hearing Info: Adequate Speech Info: Adequate    SPECIAL CARE FACTORS FREQUENCY  PT (By licensed PT), OT (By licensed OT), Speech therapy     PT Frequency: 5x/week OT Frequency: 3x/week     Speech Therapy Frequency: 2x/week      Contractures Contractures Info: Not present    Additional Factors Info  Code Status, Allergies, Psychotropic Code Status Info: Full Allergies Info: NKA Psychotropic Info: Klonopin; Seroquel         Current Medications (07/24/2018):  This is the current hospital active medication list Current Facility-Administered Medications  Medication Dose Route Frequency Provider Last Rate Last Dose  . aspirin chewable tablet 81 mg  81 mg Oral Daily  Cirigliano, Vito V, DO   81 mg at 07/23/18 0957  . Chlorhexidine Gluconate Cloth 2 % PADS 6 each  6 each Topical Daily Cirigliano, Vito V, DO   6 each at 07/23/18 1000  . cholecalciferol (VITAMIN D) tablet 1,000 Units  1,000 Units Oral Daily Cirigliano, Vito V, DO   1,000 Units at 07/23/18  0957  . clonazePAM (KLONOPIN) disintegrating tablet 0.5 mg  0.5 mg Oral Daily Cirigliano, Vito V, DO   0.5 mg at 07/23/18 0956  . enoxaparin (LOVENOX) injection 40 mg  40 mg Subcutaneous Daily Cirigliano, Vito V, DO   40 mg at 07/23/18 1001  . folic acid (FOLVITE) tablet 1 mg  1 mg Oral Daily Cirigliano, Vito V, DO   1 mg at 07/23/18 0956  . gemfibrozil (LOPID) tablet 600 mg  600 mg Oral BID AC Cirigliano, Vito V, DO   600 mg at 07/23/18 1744  . haloperidol lactate (HALDOL) injection 1-2 mg  1-2 mg Intravenous Q3H PRN Cherene Altes, MD   2 mg at 07/23/18 2208  . ipratropium-albuterol (DUONEB) 0.5-2.5 (3) MG/3ML nebulizer solution 3 mL  3 mL Nebulization Q6H PRN Cirigliano, Vito V, DO      . levETIRAcetam (KEPPRA) tablet 500 mg  500 mg Oral BID Cirigliano, Vito V, DO   500 mg at 07/23/18 2209  . magnesium hydroxide (MILK OF MAGNESIA) suspension 30 mL  30 mL Oral Once PRN Cirigliano, Vito V, DO      . MEDLINE mouth rinse  15 mL Mouth Rinse BID Cirigliano, Vito V, DO   15 mL at 07/23/18 2211  . nicotine (NICODERM CQ - dosed in mg/24 hours) patch 21 mg  21 mg Transdermal Daily Cirigliano, Vito V, DO   21 mg at 07/23/18 1004  . pantoprazole (PROTONIX) EC tablet 40 mg  40 mg Oral Q1200 Cirigliano, Vito V, DO   40 mg at 07/23/18 0956  . predniSONE (DELTASONE) tablet 10 mg  10 mg Oral Q breakfast Cirigliano, Vito V, DO   10 mg at 07/23/18 0957  . QUEtiapine (SEROQUEL) tablet 50 mg  50 mg Oral BID Cirigliano, Vito V, DO   50 mg at 07/23/18 2209  . sodium chloride flush (NS) 0.9 % injection 10-40 mL  10-40 mL Intracatheter Q12H Cirigliano, Vito V, DO   10 mL at 07/23/18 2213  . sodium chloride flush (NS) 0.9 % injection 10-40 mL  10-40 mL Intracatheter PRN Cirigliano, Vito V, DO   20 mL at 07/21/18 2027  . thiamine (VITAMIN B-1) tablet 100 mg  100 mg Oral Daily Cirigliano, Vito V, DO   100 mg at 07/23/18 1740     Discharge Medications: Please see discharge summary for a list of discharge  medications.  Relevant Imaging Results:  Relevant Lab Results:   Additional Information SSN: [place], [place]

## 2018-07-24 NOTE — Progress Notes (Signed)
Patient was discharged to nursing home [facility]) by MD order; discharged instructions review and sent to facility via PTAR; PICC line DIC; facility was called and report was given to the nurse who is going to receive the patient; patient will be transported to facility via [place].

## 2018-07-24 NOTE — Progress Notes (Signed)
PICC removed per order.  Vaseline gauze and gauze placed over site, and is clean, dry, and intact.  Patient aware not to take dressing off and not get it wet for 24 hours.  Patient and RN aware that patient on bed rest for 30 minutes which ends at 1435. Confirmed bed alarm is on.

## 2018-07-24 NOTE — Progress Notes (Signed)
Patient will DC to: [facility] Anticipated DC date: 07/24/18 Family notified: Niece, Santiago Glad Transport by: PTAR 2:30pm   Per MD patient ready for DC to [place]. RN, patient, patient's family, and facility notified of DC. Discharge Summary and FL2 sent to facility. RN to call report prior to discharge [phone]). DC packet on chart. Ambulance transport requested for patient.   CSW will sign off for now as social work intervention is no longer needed. Please consult [place] again if new needs arise.  Cedric Fishman, LCSW Clinical Social Worker [phone]

## 2018-07-25 ENCOUNTER — Non-Acute Institutional Stay (SKILLED_NURSING_FACILITY): Payer: Medicare Other | Admitting: Internal Medicine

## 2018-07-25 ENCOUNTER — Encounter: Payer: Self-pay | Admitting: Internal Medicine

## 2018-07-25 ENCOUNTER — Telehealth: Payer: Self-pay

## 2018-07-25 DIAGNOSIS — L719 Rosacea, unspecified: Secondary | ICD-10-CM

## 2018-07-25 DIAGNOSIS — K222 Esophageal obstruction: Secondary | ICD-10-CM | POA: Diagnosis not present

## 2018-07-25 DIAGNOSIS — J189 Pneumonia, unspecified organism: Secondary | ICD-10-CM | POA: Diagnosis not present

## 2018-07-25 DIAGNOSIS — R1310 Dysphagia, unspecified: Secondary | ICD-10-CM | POA: Diagnosis not present

## 2018-07-25 DIAGNOSIS — R1319 Other dysphagia: Secondary | ICD-10-CM

## 2018-07-25 NOTE — Telephone Encounter (Signed)
Possible re-admission to facility. This is a patient you were seeing at [facility] . [facility] F/U is needed if patient was re-admitted to facility upon discharge. Hospital discharge from [facility] on 07/24/2018

## 2018-07-25 NOTE — Assessment & Plan Note (Signed)
Schedule follow-up EGD

## 2018-07-25 NOTE — Patient Instructions (Signed)
See assessment and plan under each diagnosis in the problem list and acutely for this visit 

## 2018-07-25 NOTE — Progress Notes (Signed)
NURSING HOME LOCATION:  [facility] ROOM NUMBER:  [id number]  CODE STATUS:  Full Code  PCP:  Hendricks Limes, MD  [place] [id number]  This is a [place] readmission within 30 days.  Interim medical record and care since last [place] visit was updated with review of diagnostic studies and change in clinical status since last visit were documented.  HPI: Patient was rehospitalized 10/10-10/28/2019 [facility] pneumonia; aspiration etiology was suspect.  This was complicated by acute hypoxic respiratory failure.  Presentation was of progressive shortness of breath and O2 sats in the 80s despite neb treatmants initiated at the SNF.  In the ED he was placed on 6 L of supplemental oxygen via nasal cannula.  Suctioning was required for large volumes of oral pharyngeal secretions.  White blood count was 33,300, lactic acid level 2.1, and chest x-ray findings as noted above. He was transferred to the ICU for intubation because of respiratory failure.  Bradycardia in the 20s was noted post Precedex 10/15.   Bronchoscopy was performed 10/18, this revealed thick secretions in the lower lobes.  Patient was extubated 10/20.  As part of aspiration and esophageal dysphagia evaluation,EGD was performed 10/25.  This revealed a benign appearing intrinsic moderate stenosis measuring 1 cm 37 cm from the incisors.  Dilation was performed with endoscope.  Repeat EGD to follow-up the lesion and possibly dilate the esophagus was to be scheduled in 8 weeks.  He was tolerating soft diet but the patient had no dentures.  He was discharged on pured diet to be advanced to soft when dentures could be located.  He completed a 10-day course of Zosyn for clinical aspiration pneumonia. The history above is in the context of a history of seizures possibly related to alcohol withdrawal.  Also he has had intermittent acute metabolic encephalopathy for which Seroquel and as needed & Haldol were  prescribed.  Thiamine and folate were continued because of his history of alcohol abuse.  Review of systems: Responses are somewhat questionable as he gave the date as November 24, 2017.  He states that he still has a slight cough with scant white sputum.  He denies any other signs of upper or lower respiratory tract symptoms.  He also denies any active GI symptoms.  Constitutional: No fever, significant weight change  Eyes: No redness, discharge, pain, vision change ENT/mouth: No nasal congestion,  purulent discharge, earache, change in hearing, sore throat  Cardiovascular: No chest pain, palpitations, paroxysmal nocturnal dyspnea, claudication, edema  Respiratory: No  production, hemoptysis, significant snoring, apnea   Gastrointestinal: No heartburn, dysphagia, abdominal pain, nausea /vomiting, rectal bleeding, melena, change in bowels Genitourinary: No dysuria, hematuria, pyuria, incontinence, nocturia Musculoskeletal: No joint stiffness, joint swelling, weakness, pain Dermatologic: No rash, pruritus, change in appearance of skin Neurologic: No dizziness, headache, syncope, seizures, numbness, tingling Psychiatric: No significant anxiety, depression, insomnia, anorexia Endocrine: No change in hair/skin/nails, excessive thirst, excessive hunger, excessive urination  Hematologic/lymphatic: No significant bruising, lymphadenopathy, abnormal bleeding Allergy/immunology: No itchy/watery eyes, significant sneezing, urticaria, angioedema  Physical exam:  Pertinent or positive findings: He appears somewhat chronically ill.  Alopecia is present.  Initially he was very lethargic but could be aroused.  He has slight ptosis of the right eye.  The right pupil was asymmetrically enlarged compared to the left.  Complete dentures are in place.  Rosacea is present in an irregular pattern over the lower face.  Heart sounds are markedly distant and almost silent.  Heart sounds cannot even be heard in the  epigastrium.  Breath sounds are decreased as well except for some rales in the LLL > RLL.  Dorsalis pedis pulses are decreased compared to the posterior tibial pulses.  He has irregular hyperpigmented scarring over the forearms interspersed with irregular vitiliginous lesions.  The nails are nicotine stained.  He has severe clubbing.  General appearance:  no acute distress, increased work of breathing is present.   Lymphatic: No lymphadenopathy about the head, neck, axilla. Eyes: No conjunctival inflammation or lid edema is present. There is no scleral icterus. Ears:  External ear exam shows no significant lesions or deformities.   Nose:  External nasal examination shows no deformity or inflammation. Nasal mucosa are pink and moist without lesions, exudates Oral exam:  Lips and gums are healthy appearing. There is no oropharyngeal erythema or exudate. Neck:  No thyromegaly, masses, tenderness noted.    Abdomen: Bowel sounds are normal. Abdomen is soft and nontender with no organomegaly, hernias, masses. GU: Deferred  Extremities:  No cyanosis,  edema  Skin: Warm & dry w/o tenting.  See summary under each active problem in the Problem List with associated updated therapeutic plan

## 2018-07-25 NOTE — Assessment & Plan Note (Addendum)
Speech therapy monitor at the SNF Advance diet as tolerated as dentures have been found

## 2018-07-25 NOTE — Assessment & Plan Note (Signed)
Continue pulmonary toilet at SNF. 

## 2018-07-26 ENCOUNTER — Encounter: Payer: Self-pay | Admitting: Internal Medicine

## 2018-08-11 ENCOUNTER — Encounter: Payer: Self-pay | Admitting: Adult Health

## 2018-08-11 ENCOUNTER — Non-Acute Institutional Stay (SKILLED_NURSING_FACILITY): Payer: Medicare Other | Admitting: Adult Health

## 2018-08-11 DIAGNOSIS — K222 Esophageal obstruction: Secondary | ICD-10-CM

## 2018-08-11 DIAGNOSIS — E785 Hyperlipidemia, unspecified: Secondary | ICD-10-CM | POA: Diagnosis not present

## 2018-08-11 DIAGNOSIS — F29 Unspecified psychosis not due to a substance or known physiological condition: Secondary | ICD-10-CM

## 2018-08-11 DIAGNOSIS — F1010 Alcohol abuse, uncomplicated: Secondary | ICD-10-CM

## 2018-08-11 DIAGNOSIS — G40909 Epilepsy, unspecified, not intractable, without status epilepticus: Secondary | ICD-10-CM

## 2018-08-11 DIAGNOSIS — J42 Unspecified chronic bronchitis: Secondary | ICD-10-CM

## 2018-08-11 DIAGNOSIS — G4700 Insomnia, unspecified: Secondary | ICD-10-CM

## 2018-08-11 DIAGNOSIS — E559 Vitamin D deficiency, unspecified: Secondary | ICD-10-CM

## 2018-08-11 DIAGNOSIS — J690 Pneumonitis due to inhalation of food and vomit: Secondary | ICD-10-CM | POA: Diagnosis not present

## 2018-08-11 DIAGNOSIS — F17210 Nicotine dependence, cigarettes, uncomplicated: Secondary | ICD-10-CM

## 2018-08-11 MED ORDER — FOLIC ACID 1 MG PO TABS: 1.0000 mg | ORAL_TABLET | Freq: Every day | ORAL | 0 refills | Status: DC

## 2018-08-11 MED ORDER — IPRATROPIUM-ALBUTEROL 0.5-2.5 (3) MG/3ML IN SOLN: 3.0000 mL | RESPIRATORY_TRACT | 0 refills | Status: DC

## 2018-08-11 MED ORDER — QUETIAPINE FUMARATE 50 MG PO TABS: 25.0000 mg | ORAL_TABLET | Freq: Every day | ORAL | 0 refills | Status: DC

## 2018-08-11 MED ORDER — THIAMINE HCL 100 MG PO TABS: 100.0000 mg | ORAL_TABLET | Freq: Every day | ORAL | 0 refills | Status: DC

## 2018-08-11 MED ORDER — NICOTINE 21 MG/24HR TD PT24: 21.0000 mg | MEDICATED_PATCH | Freq: Every day | TRANSDERMAL | 0 refills | Status: DC

## 2018-08-11 MED ORDER — GEMFIBROZIL 600 MG PO TABS: 600.0000 mg | ORAL_TABLET | Freq: Two times a day (BID) | ORAL | 0 refills | Status: DC

## 2018-08-11 MED ORDER — LEVETIRACETAM 500 MG PO TABS: 500.0000 mg | ORAL_TABLET | Freq: Two times a day (BID) | ORAL | 0 refills | Status: DC

## 2018-08-11 MED ORDER — VITAMIN D-3 25 MCG (1000 UT) PO CAPS: 1000.0000 [IU] | ORAL_CAPSULE | Freq: Every day | ORAL | 0 refills | Status: DC

## 2018-08-11 NOTE — Progress Notes (Signed)
Location:  [facility] Room Number: 119-A Place of Service:  SNF (31) Provider:  Durenda Age, NP  Patient Care Team: Hendricks Limes, MD as PCP - General (Internal Medicine)  Extended Emergency Contact Information Primary Emergency Contact: Alvira Monday [place] Mobile Phone: [phone] Relation: Niece Secondary Emergency Contact: Tamala Fothergill [place] Mobile Phone: [phone] Relation: Other  Code Status:  Full Code  Goals of care: Advanced Directive information Advanced Directives 07/06/2018  Does Patient Have a Medical Advance Directive? No  Type of Advance Directive -  Copy of [place] in Chart? -  Would patient like information on creating a medical advance directive? No - Patient declined  Pre-existing out of facility DNR order (yellow form or pink MOST form) -     Chief Complaint  Patient presents with  . Discharge Note    Patient is seen for discharge, discharging home 08/12/18.    HPI:  Pt is a 77 y.o. male seen today for discharge. He will have Home health PT, OT and ST.   He has been re-admitted to [facility] on 07/24/18 after a recent hospitalization for aspiration pneumonia. He was intubated on 07/07/18 for acute respiratory failure with hypoxemia. Bronchoscopy showed thick secretions on lower lobes. He was extubated on 07/16/18. EGD done on 07/21/18 showed benign appearing intrinsic moderate stenosis measuring 1 cm in length.  Dilation with endoscope was performed on 07/21/18. Of note, before hospitalization, he was having [place]-term rehabilitation at [facility] due to a recent hospitalization for altered mental status/seizures which was thought to be due to alcohol withdrawal.  He was started on Keppra.  Patient was admitted to this facility for [place]-term rehabilitation after the patient's recent hospitalization.  Patient  has completed SNF rehabilitation and therapy has cleared the patient for discharge.   Past Medical History:  Diagnosis Date  . Chronic insomnia   . Cigarette smoker   . COPD (chronic obstructive pulmonary disease) ([place])   . Diverticulosis of colon   . Elevated prostate specific antigen (PSA)   . ETOH abuse   . GERD (gastroesophageal reflux disease)   . Hx of colonic polyps   . Hyperlipidemia   . Hypertension   . Pulmonary nodule   . Shortness of breath   . Stroke [facility])    Past Surgical History:  Procedure Laterality Date  . COLONOSCOPY    . ESOPHAGOGASTRODUODENOSCOPY (EGD) WITH PROPOFOL N/A 07/21/2018   Procedure: ESOPHAGOGASTRODUODENOSCOPY (EGD) WITH PROPOFOL;  Surgeon: Lavena Bullion, DO;  Location: [facility];  Service: Gastroenterology;  Laterality: N/A;  . needle biopsy RUL nodule  01/2009   benign  . TONSILLECTOMY  1947  . VASECTOMY      No Known Allergies  Outpatient Encounter Medications as of 08/11/2018  Medication Sig  . acetaminophen (TYLENOL) 325 MG tablet Take 650 mg by mouth every 6 (six) hours as needed.  Marland Kitchen aspirin 81 MG chewable tablet Chew 81 mg by mouth daily.  . Cholecalciferol (VITAMIN D-3) 1000 units CAPS Take 1,000 Units by mouth daily.  . folic acid (FOLVITE) 1 MG tablet Take 1 tablet (1 mg total) by mouth daily.  Marland Kitchen gemfibrozil (LOPID) 600 MG tablet Take 1 tablet (600 mg total) by mouth 2 (two) times daily.  Marland Kitchen ipratropium-albuterol (DUONEB) 0.5-2.5 (3) MG/3ML SOLN Take 3 mLs by nebulization See admin instructions. Nebulize and inhale 3 ml's into the lungs at 6 AM, 2 PM, and 10 PM for  5 days with a start date of 07/06/18 AND 3 ml's every six hours as needed for coughing  . levETIRAcetam (KEPPRA) 500 MG tablet Take 1 tablet (500 mg total) by mouth 2 (two) times daily.  . magnesium hydroxide (MILK OF MAGNESIA) 400 MG/5ML suspension Take 30 mLs by mouth once as needed for mild constipation.  . Melatonin 3 MG TABS Take 3 mg by mouth at bedtime.  .  nicotine (NICODERM CQ - DOSED IN MG/24 HOURS) 21 mg/24hr patch Place 21 mg onto the skin daily.  . Nutritional Supplements (NUTRITIONAL SUPPLEMENT PLUS) LIQD Take 120 mLs by mouth See admin instructions. MedPass: Drink 120 ml's by mouth two times a day  . QUEtiapine (SEROQUEL) 50 MG tablet Take 0.5 tablets (25 mg total) by mouth at bedtime.  . Sodium Phosphates (RA SALINE ENEMA) 19-7 GM/118ML ENEM Place 1 enema rectally once as needed (for constipation not relieved by Dulcolax suppository and call MD if no relief from enema).  . thiamine 100 MG tablet Take 1 tablet (100 mg total) by mouth daily.  . [DISCONTINUED] bisacodyl (DULCOLAX) 10 MG suppository Place 10 mg rectally once as needed (for constipation not relieved by Milk of Magnesia).  . [DISCONTINUED] chlordiazePOXIDE (LIBRIUM) 5 MG capsule Please dispense 18 pills - Take 1 pill three times a day for 3 days, then Take 1 pill two times a day for 3 days, then Take 1 pill once a day for 3 days and stop.   No facility-administered encounter medications on file as of 08/11/2018.     Review of Systems  GENERAL: No change in appetite, no fatigue, no weight changes, no fever, chills or weakness MOUTH and THROAT: Denies oral discomfort, gingival pain or bleeding RESPIRATORY: no cough, SOB, DOE, wheezing, hemoptysis CARDIAC: No chest pain, edema or palpitations GI: No abdominal pain, diarrhea, constipation, heart burn, nausea or vomiting GU: Denies dysuria, frequency, hematuria, incontinence, or discharge PSYCHIATRIC: Denies feelings of depression or anxiety. No report of hallucinations, insomnia, paranoia, or agitation   Immunization History  Administered Date(s) Administered  . H1N1 10/22/2008  . Influenza Split 06/28/2011, 06/15/2012  . Influenza Whole 07/26/2007, 08/11/2010  . Influenza, High Dose Seasonal PF 08/27/2016  . Pneumococcal Polysaccharide-23 08/10/2010, 08/27/2016  . Tdap 06/15/2012, 05/08/2013, 10/14/2014   Pertinent   Health Maintenance Due  Topic Date Due  . OPHTHALMOLOGY EXAM  05/12/1951  . COLONOSCOPY  07/26/2016  . HEMOGLOBIN A1C  02/25/2017  . FOOT EXAM  08/27/2017  . URINE MICROALBUMIN  08/27/2017  . PNA vac Low Risk Adult (2 of 2 - PCV13) 08/27/2017  . INFLUENZA VACCINE  04/27/2018       Vitals:   08/11/18 0842  BP: 128/80  Pulse: 94  Resp: 18  Temp: 97.6 F (36.4 C)  TempSrc: Oral  SpO2: 98%  Weight: 142 lb 3.2 oz (64.5 kg)  Height: 5\' 11"  (1.803 m)   Body mass index is 19.83 kg/m.  Physical Exam  GENERAL APPEARANCE: Well nourished. In no acute distress.  SKIN:  Skin is warm and dry.  MOUTH and THROAT: Lips are without lesions. Oral mucosa is moist and without lesions. Tongue is normal in shape, size, and color and without lesions RESPIRATORY: Breathing is even & unlabored, BS CTAB CARDIAC: RRR, no murmur,no extra heart sounds, no edema GI: Abdomen soft, normal BS, no masses, no tenderness EXTREMITIES:  Able to move X 4 extremities. NEUROLOGICAL: There is no tremor. Speech is clear PSYCHIATRIC: Alert and oriented X 3. Affect and behavior are appropriate  Labs reviewed: Recent Labs    07/14/18 0319 07/15/18 0308  07/17/18 0331 07/18/18 0945 07/19/18 0408 07/21/18 0353  NA 143 144   < > 146* 143 142 138  K 3.7 3.4*   < > 3.2* 3.1* 3.0* 3.6  CL 103 102   < > 104 106 105 104  CO2 33* 35*   < > 34* 29 27 25   GLUCOSE 180* 179*   < > 122* 107* 87 106*  BUN 20 24*   < > 26* 24* 22 17  CREATININE 0.78 0.80   < > 0.89 0.78 0.82 0.82  CALCIUM 8.1* 8.0*   < > 8.7* 8.7* 8.8* 8.5*  MG 2.3 2.1  --  2.3  --  2.2  --   PHOS 3.9 3.4  --  3.2  --   --   --    < > = values in this interval not displayed.   Recent Labs    06/25/18 1537 06/26/18 0521 07/19/18 0408  AST 23 21 20   ALT 11 11 16   ALKPHOS 60 54 51  BILITOT 0.8 1.2 0.7  PROT 5.8* 5.4* 6.6  ALBUMIN 3.5 3.3* 2.5*   Recent Labs    01/07/18 1340  06/25/18 1537  07/06/18 1932  07/16/18 0402 07/17/18 0331  07/19/18 0408  WBC 10.5  --  7.6   < > 33.3*   < > 12.8* 12.5* 9.2  NEUTROABS 8.8*  --  5.7  --  30.3*  --   --   --   --   HGB 15.9   < > 15.7   < > 15.2   < > 12.0* 11.8* 13.0  HCT 47.1   < > 47.8   < > 47.2   < > 38.8* 37.5* 41.6  MCV 101.1*  --  105.8*   < > 105.8*   < > 108.7* 106.5* 103.7*  PLT 193  --  214   < > 369   < > 496* 511* 539*   < > = values in this interval not displayed.   Lab Results  Component Value Date   TSH 0.347 (L) 07/04/2012   Lab Results  Component Value Date   HGBA1C 4.5 (L) 08/27/2016   Lab Results  Component Value Date   CHOL 165 08/27/2016   HDL 52.20 08/27/2016   LDLCALC 96 08/27/2016   LDLDIRECT 79.9 09/30/2010   TRIG 82.0 08/27/2016   CHOLHDL 3 08/27/2016    Significant Diagnostic Results in last 30 days:  Dg Esophagus  Result Date: 07/19/2018 CLINICAL DATA:  Patient underwent a swallowing function study yesterday. Noted to have significant esophageal residual. Current exam ordered for further assessment. History of stroke. EXAM: ESOPHOGRAM/BARIUM SWALLOW TECHNIQUE: Single contrast examination was performed using  thin barium. FLUOROSCOPY TIME:  Fluoroscopy Time:  1 minutes and 12 seconds Radiation Exposure Index (if provided by the fluoroscopic device): 29 Number of Acquired Spot Images: 596 micro Gy per meter squared COMPARISON:  Prior chest radiographs. FINDINGS: Esophagus is normal in course and caliber.  No esophageal masses. There is narrowing of the distal esophagus above a small hiatal hernia. Narrowing extends for approximately 17 mm in length. This portion of the esophagus dilates to a maximum of 6-7 mm. No other a soft joint narrowing. No evidence of active inflammation or ulceration. There is significant dysmotility with diffuse tertiary contractions from the mid through the distal esophagus leading to significant barium stasis. No reflux was documented during the exam. IMPRESSION: 1.  Distal esophageal stricture extending for a length of  approximately 17 mm, narrowing the esophagus to approximately 6-7 mm. This has the appearance of a benign stricture. 2. Significant esophageal dysmotility with tertiary contractions and barium stasis. Electronically Signed   By: Lajean Manes M.D.   On: 07/19/2018 09:23   Dg Chest Port 1 View  Result Date: 07/17/2018 CLINICAL DATA:  Respiratory failure EXAM: PORTABLE CHEST 1 VIEW COMPARISON:  July 15, 2018 FINDINGS: Central catheter tip is in the superior vena cava. No pneumothorax. There is persistent small left pleural effusion with consolidation in the left base. Right lung is clear. Note that there is overall mild hyperexpansion. Heart size and pulmonary vascularity are normal. There is aortic atherosclerosis. Bones are osteoporotic. There is evidence of old rib trauma on the left. IMPRESSION: Central catheter tip in superior vena cava. No pneumothorax. Consolidation left base with small left pleural effusion, stable. No new opacity. Underlying hyperexpansion. Stable cardiac silhouette. There is aortic atherosclerosis. Aortic Atherosclerosis (ICD10-I70.0). Electronically Signed   By: Lowella Grip III M.D.   On: 07/17/2018 07:17   Dg Chest Port 1 View  Result Date: 07/15/2018 CLINICAL DATA:  Acute respiratory failure. EXAM: PORTABLE CHEST 1 VIEW COMPARISON:  07/14/2018 FINDINGS: The heart size is normal. Endotracheal tube is in satisfactory position. NG tube courses off the inferior border of the film. Mild edema is again seen. Progressive left basilar airspace opacity is noted. More mild right basilar opacities are noted. The visualized soft tissues and bony thorax are unremarkable. IMPRESSION: 1. Progressive left basilar airspace disease worrisome for pneumonia. 2. Mild edema unchanged. 3. Support apparatus stable. Electronically Signed   By: San Morelle M.D.   On: 07/15/2018 07:20   Dg Chest Port 1 View  Result Date: 07/14/2018 CLINICAL DATA:  77 year old male with left lower  lobe pneumonia. Intubated. EXAM: PORTABLE CHEST 1 VIEW COMPARISON:  07/13/2018 and earlier. FINDINGS: Portable AP semi upright view at 0422 hours. Stable endotracheal tube tip at the level the clavicles. Enteric tube courses to the abdomen, tip not included. Stable PICC line. Stable cardiac size and mediastinal contours. Stable lung volumes. Veiling opacity at both lung bases, greater on the left. Dense retrocardiac opacity. No pneumothorax. Stable pulmonary vascularity without edema. Chronic appearing left upper rib fractures. Paucity of bowel gas in the visible abdomen. IMPRESSION: 1.  Stable lines and tubes. 2. Ventilation not significantly changed. Bilateral pleural effusions suspected probably greater on the left. Superimposed left lung base atelectasis or consolidation. Electronically Signed   By: Genevie Ann M.D.   On: 07/14/2018 07:12   Dg Chest Port 1 View  Result Date: 07/13/2018 CLINICAL DATA:  Acute respiratory failure EXAM: PORTABLE CHEST 1 VIEW COMPARISON:  07/12/2018 FINDINGS: Left lower lobe airspace disease unchanged. Mild right lower lobe airspace disease unchanged. Endotracheal tube in good position. Right arm PICC tip at the cavoatrial junction unchanged. NG tube enters the stomach with the tip not visualized. IMPRESSION: Support lines remain in good position. No significant change in bibasilar consolidation left greater than right Electronically Signed   By: Franchot Gallo M.D.   On: 07/13/2018 07:51   Dg Swallowing Func-speech Pathology  Result Date: 07/17/2018 Objective Swallowing Evaluation: Type of Study: MBS-Modified Barium Swallow Study  Patient Details Name: Blake [name] MRN: [id number] Date of Birth: Oct 27, 1940 Today's Date: 07/17/2018 Time: SLP Start Time (ACUTE ONLY): 1330 -SLP Stop Time (ACUTE ONLY): 8127 SLP Time Calculation (min) (ACUTE ONLY): 35 min Past Medical History: Past Medical History: Diagnosis Date .  Chronic insomnia  . Cigarette smoker  . COPD (chronic obstructive  pulmonary disease) ([place])  . Diverticulosis of colon  . Elevated prostate specific antigen (PSA)  . ETOH abuse  . GERD (gastroesophageal reflux disease)  . Hx of colonic polyps  . Hyperlipidemia  . Hypertension  . Pulmonary nodule  . Shortness of breath  . Stroke [facility])  Past Surgical History: Past Surgical History: Procedure Laterality Date . COLONOSCOPY   . needle biopsy RUL nodule  01/2009  benign . TONSILLECTOMY  1947 . VASECTOMY   HPI: 77 year old smoker, nursing home resident admitted with left lower lobe HCAP and intubated for nine days for acute hypercarbic respiratory failure. He had recent admission 06/25/18 through 06/29/18 for seizures and EtOH withdrawal and was discharged to [facility] for rehab  Subjective: alert, cooperative, follows commands Assessment / Plan / Recommendation CHL IP CLINICAL IMPRESSIONS 07/17/2018 Clinical Impression Pt presents with a primary esophageal and secondary pharyngeal dysphagia.  Swallow response occurred at the  pyriforms for liquids, valleculae for purees, with adequate airway closure but reduced pharyngeal/base of tongue constriction, leading to diffuse residue throughout the pharynx for all consistencies.  After several boluses of barium, material was viewed to backflow from the esophagus into the pharynx.  A scan of the esophagus revealed barium residue throughout.  Remarkably, there was no aspiration despite consumption of at least four oz of thin liquid barium and 2 oz applesauce. (There was penetration of thin liquids, but it was ejected from larynx upon completion of swallow -Penetration-Aspiration Scale rating of 2).  After study ended, pt regurgitated copious quantities of barium into emesis bag.  Per Sept 2019 clinical swallow evaluation, pt demonstrated s/s of an esophageal dysphagia.  MBS results reveal what is likely a chronic disorder  For now, recommend starting with ice chips/sips of water; crush meds and give with applesauce.  SLP will follow for safety/diet  advancement.  If clinical presentation does not improved in a few days, pt may need esophageal w/u. D/W pt, who has poor awareness, and RN.  SLP Visit Diagnosis Dysphagia, pharyngoesophageal phase (R13.14) Attention and concentration deficit following -- Frontal lobe and executive function deficit following -- Impact on safety and function Moderate aspiration risk   CHL IP TREATMENT RECOMMENDATION 07/17/2018 Treatment Recommendations Therapy as outlined in treatment plan below   Prognosis 07/17/2018 Prognosis for Safe Diet Advancement Good Barriers to Reach Goals -- Barriers/Prognosis Comment -- CHL IP DIET RECOMMENDATION 07/17/2018 SLP Diet Recommendations Other (Comment) Liquid Administration via Straw;Cup Medication Administration Crushed with puree Compensations Slow rate;Small sips/bites Postural Changes Remain semi-upright after after feeds/meals (Comment);Seated upright at 90 degrees   CHL IP OTHER RECOMMENDATIONS 07/17/2018 Recommended Consults Consider esophageal assessment Oral Care Recommendations -- Other Recommendations --   CHL IP FOLLOW UP RECOMMENDATIONS 07/17/2018 Follow up Recommendations Skilled Nursing facility   [facility] IP FREQUENCY AND DURATION 07/17/2018 Speech Therapy Frequency (ACUTE ONLY) min 3x week Treatment Duration 1 week      CHL IP ORAL PHASE 07/17/2018 Oral Phase WFL Oral - Pudding Teaspoon -- Oral - Pudding Cup -- Oral - Honey Teaspoon -- Oral - Honey Cup -- Oral - Nectar Teaspoon -- Oral - Nectar Cup -- Oral - Nectar Straw -- Oral - Thin Teaspoon -- Oral - Thin Cup -- Oral - Thin Straw -- Oral - Puree -- Oral - Mech Soft -- Oral - Regular -- Oral - Multi-Consistency -- Oral - Pill -- Oral Phase - Comment --  CHL IP PHARYNGEAL PHASE 07/17/2018 Pharyngeal Phase Impaired Pharyngeal-  Pudding Teaspoon -- Pharyngeal -- Pharyngeal- Pudding Cup -- Pharyngeal -- Pharyngeal- Honey Teaspoon -- Pharyngeal -- Pharyngeal- Honey Cup -- Pharyngeal -- Pharyngeal- Nectar Teaspoon -- Pharyngeal --  Pharyngeal- Nectar Cup Delayed swallow initiation-vallecula;Reduced pharyngeal peristalsis;Reduced tongue base retraction;Pharyngeal residue - valleculae;Pharyngeal residue - pyriform Pharyngeal -- Pharyngeal- Nectar Straw -- Pharyngeal -- Pharyngeal- Thin Teaspoon -- Pharyngeal -- Pharyngeal- Thin Cup Delayed swallow initiation-pyriform sinuses;Reduced pharyngeal peristalsis;Reduced tongue base retraction;Pharyngeal residue - valleculae;Pharyngeal residue - pyriform;Penetration/Aspiration before swallow Pharyngeal Material enters airway, remains ABOVE vocal cords then ejected out Pharyngeal- Thin Straw Delayed swallow initiation-pyriform sinuses;Reduced pharyngeal peristalsis;Reduced tongue base retraction;Pharyngeal residue - valleculae;Pharyngeal residue - pyriform Pharyngeal -- Pharyngeal- Puree Delayed swallow initiation-vallecula;Reduced tongue base retraction;Pharyngeal residue - valleculae;Pharyngeal residue - pyriform Pharyngeal -- Pharyngeal- Mechanical Soft -- Pharyngeal -- Pharyngeal- Regular -- Pharyngeal -- Pharyngeal- Multi-consistency -- Pharyngeal -- Pharyngeal- Pill -- Pharyngeal -- Pharyngeal Comment --  CHL IP CERVICAL ESOPHAGEAL PHASE 07/17/2018 Cervical Esophageal Phase Impaired Pudding Teaspoon -- Pudding Cup -- Honey Teaspoon -- Honey Cup -- Nectar Teaspoon -- Nectar Cup -- Nectar Straw -- Thin Teaspoon -- Thin Cup -- Thin Straw -- Puree -- Mechanical Soft -- Regular -- Multi-consistency -- Pill -- Cervical Esophageal Comment -- Juan Quam Laurice 07/17/2018, 2:41 PM               Assessment/Plan  1. Aspiration pneumonia of left lower lobe, unspecified aspiration pneumonia type ([place]) -completed antibiotic therapy, resolved   2. Seizure disorder (HCC) - levETIRAcetam (KEPPRA) 500 MG tablet; Take 1 tablet (500 mg total) by mouth 2 (two) times daily.  Dispense: 60 tablet; Refill: 0  3. ETOH abuse - folic acid (FOLVITE) 1 MG tablet; Take 1 tablet (1 mg total) by mouth daily.   Dispense: 30 tablet; Refill: 0 - thiamine 100 MG tablet; Take 1 tablet (100 mg total) by mouth daily.  Dispense: 30 tablet; Refill: 0  4. Dyslipidemia - gemfibrozil (LOPID) 600 MG tablet; Take 1 tablet (600 mg total) by mouth 2 (two) times daily.  Dispense: 60 tablet; Refill: 0  5. Esophageal stricture - EGD done on 07/21/18 showed benign appearing intrinsic moderate stenosis measuring 1 cm in length.  Dilation with endoscope was performed on 07/21/18, will have [place] to work on swallowing functions   6. Chronic bronchitis, unspecified chronic bronchitis type ([place]) - ipratropium-albuterol (DUONEB) 0.5-2.5 (3) MG/3ML SOLN; Take 3 mLs by nebulization See admin instructions. Nebulize and inhale 3 ml's into the lungs at 6 AM, 2 PM, and 10 PM for 5 days with a start date of 07/06/18 AND 3 ml's every six hours as needed for coughing  Dispense: 3 mL; Refill: 0  7. Cigarette smoker - nicotine (NICODERM CQ - DOSED IN MG/24 HOURS) 21 mg/24hr patch; Place 1 patch (21 mg total) onto the skin daily.  Dispense: 28 patch; Refill: 0  8. Insomnia, unspecified type -continue melatonin 3 mg 1 tab nightly   9. Psychosis, unspecified psychosis type ([place]) - QUEtiapine (SEROQUEL) 50 MG tablet; Take 0.5 tablets (25 mg total) by mouth at bedtime.  Dispense: 15 tablet; Refill: 0  10. Vitamin D deficiency - Cholecalciferol (VITAMIN D-3) 25 MCG (1000 UT) CAPS; Take 1 capsule (1,000 Units total) by mouth daily.  Dispense: 30 capsule; Refill: 0    I have filled out patient's discharge paperwork and written prescriptions.  Patient will receive home health PT, OT, and ST.  DME provided:  None  Total discharge time: Less than 30 minutes  Discharge time involved coordination of the  discharge process with Education officer, museum, nursing staff and therapy department. Medical justification for home health services verified.   Durenda Age, NP [facility] and Adult Medicine [phone]  (Monday-Friday 8:00 a.m. - 5:00 p.m.) [phone] (after hours)

## 2018-08-16 DIAGNOSIS — G40909 Epilepsy, unspecified, not intractable, without status epilepticus: Secondary | ICD-10-CM

## 2018-08-16 DIAGNOSIS — J690 Pneumonitis due to inhalation of food and vomit: Secondary | ICD-10-CM | POA: Diagnosis not present

## 2018-08-16 DIAGNOSIS — R41840 Attention and concentration deficit: Secondary | ICD-10-CM | POA: Diagnosis not present

## 2018-08-16 DIAGNOSIS — I10 Essential (primary) hypertension: Secondary | ICD-10-CM

## 2018-08-16 DIAGNOSIS — J449 Chronic obstructive pulmonary disease, unspecified: Secondary | ICD-10-CM | POA: Diagnosis not present

## 2018-08-18 ENCOUNTER — Ambulatory Visit: Payer: Medicare Other | Admitting: Gastroenterology

## 2019-01-09 ENCOUNTER — Emergency Department (HOSPITAL_COMMUNITY): Payer: Medicare Other

## 2019-01-09 ENCOUNTER — Inpatient Hospital Stay (HOSPITAL_COMMUNITY)
Admission: EM | Admit: 2019-01-09 | Discharge: 2019-01-13 | DRG: 064 | Disposition: A | Discharge status: Skilled Nursing Facility | Payer: Medicare Other | Attending: Family Medicine | Admitting: Family Medicine

## 2019-01-09 ENCOUNTER — Other Ambulatory Visit: Payer: Self-pay

## 2019-01-09 ENCOUNTER — Encounter (HOSPITAL_COMMUNITY): Payer: Self-pay | Admitting: Student

## 2019-01-09 DIAGNOSIS — I639 Cerebral infarction, unspecified: Secondary | ICD-10-CM | POA: Diagnosis not present

## 2019-01-09 DIAGNOSIS — K219 Gastro-esophageal reflux disease without esophagitis: Secondary | ICD-10-CM | POA: Diagnosis present

## 2019-01-09 DIAGNOSIS — G9341 Metabolic encephalopathy: Secondary | ICD-10-CM | POA: Diagnosis present

## 2019-01-09 DIAGNOSIS — R296 Repeated falls: Secondary | ICD-10-CM | POA: Diagnosis present

## 2019-01-09 DIAGNOSIS — W19XXXA Unspecified fall, initial encounter: Secondary | ICD-10-CM

## 2019-01-09 DIAGNOSIS — Z833 Family history of diabetes mellitus: Secondary | ICD-10-CM

## 2019-01-09 DIAGNOSIS — Z20828 Contact with and (suspected) exposure to other viral communicable diseases: Secondary | ICD-10-CM | POA: Diagnosis present

## 2019-01-09 DIAGNOSIS — G40909 Epilepsy, unspecified, not intractable, without status epilepticus: Secondary | ICD-10-CM

## 2019-01-09 DIAGNOSIS — G9340 Encephalopathy, unspecified: Secondary | ICD-10-CM | POA: Diagnosis present

## 2019-01-09 DIAGNOSIS — F10231 Alcohol dependence with withdrawal delirium: Secondary | ICD-10-CM | POA: Diagnosis present

## 2019-01-09 DIAGNOSIS — E785 Hyperlipidemia, unspecified: Secondary | ICD-10-CM | POA: Diagnosis present

## 2019-01-09 DIAGNOSIS — F1010 Alcohol abuse, uncomplicated: Secondary | ICD-10-CM

## 2019-01-09 DIAGNOSIS — Z7982 Long term (current) use of aspirin: Secondary | ICD-10-CM

## 2019-01-09 DIAGNOSIS — Z801 Family history of malignant neoplasm of trachea, bronchus and lung: Secondary | ICD-10-CM

## 2019-01-09 DIAGNOSIS — K5730 Diverticulosis of large intestine without perforation or abscess without bleeding: Secondary | ICD-10-CM | POA: Diagnosis present

## 2019-01-09 DIAGNOSIS — Z79899 Other long term (current) drug therapy: Secondary | ICD-10-CM

## 2019-01-09 DIAGNOSIS — E1151 Type 2 diabetes mellitus with diabetic peripheral angiopathy without gangrene: Secondary | ICD-10-CM | POA: Diagnosis present

## 2019-01-09 DIAGNOSIS — F5104 Psychophysiologic insomnia: Secondary | ICD-10-CM | POA: Diagnosis present

## 2019-01-09 DIAGNOSIS — Z8719 Personal history of other diseases of the digestive system: Secondary | ICD-10-CM

## 2019-01-09 DIAGNOSIS — I10 Essential (primary) hypertension: Secondary | ICD-10-CM | POA: Diagnosis present

## 2019-01-09 DIAGNOSIS — F29 Unspecified psychosis not due to a substance or known physiological condition: Secondary | ICD-10-CM

## 2019-01-09 DIAGNOSIS — R1314 Dysphagia, pharyngoesophageal phase: Secondary | ICD-10-CM | POA: Diagnosis present

## 2019-01-09 DIAGNOSIS — F17210 Nicotine dependence, cigarettes, uncomplicated: Secondary | ICD-10-CM | POA: Diagnosis present

## 2019-01-09 DIAGNOSIS — E860 Dehydration: Secondary | ICD-10-CM | POA: Diagnosis present

## 2019-01-09 DIAGNOSIS — R4182 Altered mental status, unspecified: Secondary | ICD-10-CM | POA: Diagnosis not present

## 2019-01-09 DIAGNOSIS — J449 Chronic obstructive pulmonary disease, unspecified: Secondary | ICD-10-CM | POA: Diagnosis present

## 2019-01-09 DIAGNOSIS — R509 Fever, unspecified: Secondary | ICD-10-CM | POA: Diagnosis present

## 2019-01-09 DIAGNOSIS — D72829 Elevated white blood cell count, unspecified: Secondary | ICD-10-CM | POA: Diagnosis present

## 2019-01-09 DIAGNOSIS — Z8673 Personal history of transient ischemic attack (TIA), and cerebral infarction without residual deficits: Secondary | ICD-10-CM

## 2019-01-09 LAB — COMPREHENSIVE METABOLIC PANEL
ALT: 14 U/L (ref 0–44)
AST: 44 U/L — ABNORMAL HIGH (ref 15–41)
Albumin: 4.2 g/dL (ref 3.5–5.0)
Alkaline Phosphatase: 90 U/L (ref 38–126)
Anion gap: 14 (ref 5–15)
BUN: 15 mg/dL (ref 8–23)
CO2: 18 mmol/L — ABNORMAL LOW (ref 22–32)
Calcium: 9.6 mg/dL (ref 8.9–10.3)
Chloride: 104 mmol/L (ref 98–111)
Creatinine, Ser: 1.16 mg/dL (ref 0.61–1.24)
GFR calc Af Amer: >60 mL/min (ref >60)
GFR calc non Af Amer: >60 mL/min (ref >60)
Glucose, Bld: 92 mg/dL (ref 70–99)
Potassium: 4.7 mmol/L (ref 3.5–5.1)
Sodium: 136 mmol/L (ref 135–145)
Total Bilirubin: 1.9 mg/dL — ABNORMAL HIGH (ref 0.3–1.2)
Total Protein: 6.7 g/dL (ref 6.5–8.1)

## 2019-01-09 LAB — CBC WITH DIFFERENTIAL/PLATELET
Abs Immature Granulocytes: 0.14 10*3/uL — ABNORMAL HIGH (ref 0.00–0.07)
Basophils Absolute: 0 10*3/uL (ref 0.0–0.1)
Basophils Relative: 0 %
Eosinophils Absolute: 0.1 10*3/uL (ref 0.0–0.5)
Eosinophils Relative: 0 %
HCT: 46.7 % (ref 39.0–52.0)
Hemoglobin: 15.4 g/dL (ref 13.0–17.0)
Immature Granulocytes: 1 %
Lymphocytes Relative: 10 %
Lymphs Abs: 1.6 10*3/uL (ref 0.7–4.0)
MCH: 31.8 pg (ref 26.0–34.0)
MCHC: 33 g/dL (ref 30.0–36.0)
MCV: 96.3 fL (ref 80.0–100.0)
Monocytes Absolute: 1.3 10*3/uL — ABNORMAL HIGH (ref 0.1–1.0)
Monocytes Relative: 8 %
Neutro Abs: 13.7 10*3/uL — ABNORMAL HIGH (ref 1.7–7.7)
Neutrophils Relative %: 81 %
Platelets: 237 10*3/uL (ref 150–400)
RBC: 4.85 MIL/uL (ref 4.22–5.81)
RDW: 13.2 % (ref 11.5–15.5)
WBC: 16.8 10*3/uL — ABNORMAL HIGH (ref 4.0–10.5)
nRBC: 0 % (ref 0.0–0.2)

## 2019-01-09 LAB — PROTIME-INR
INR: 1 (ref 0.8–1.2)
Prothrombin Time: 13.2 seconds (ref 11.4–15.2)

## 2019-01-09 LAB — ETHANOL: Alcohol, Ethyl (B): <10 mg/dL (ref <10)

## 2019-01-09 LAB — LACTIC ACID, PLASMA: Lactic Acid, Venous: 2.7 mmol/L (ref 0.5–1.9)

## 2019-01-09 LAB — CBG MONITORING, ED: Glucose-Capillary: 85 mg/dL (ref 70–99)

## 2019-01-09 MED ORDER — LORAZEPAM 2 MG/ML IJ SOLN
1.0000 mg | Freq: Once | INTRAMUSCULAR | Status: AC
  Administered 2019-01-09: 1 mg via INTRAMUSCULAR
  Filled 2019-01-09: qty 1

## 2019-01-09 MED ORDER — ACETAMINOPHEN 650 MG RE SUPP
650.0000 mg | Freq: Once | RECTAL | Status: AC
  Administered 2019-01-10: 650 mg via RECTAL
  Filled 2019-01-09: qty 1

## 2019-01-09 MED ORDER — SODIUM CHLORIDE 0.9 % IV BOLUS
1000.0000 mL | Freq: Once | INTRAVENOUS | Status: AC
  Administered 2019-01-10: 1000 mL via INTRAVENOUS

## 2019-01-09 NOTE — ED Notes (Signed)
EMS reports initial BP at 120s/60s, then elevated at 765Y systolic upon arrival. Pt continuously moving around

## 2019-01-09 NOTE — ED Provider Notes (Signed)
78 year old male received at sign out from [place] pending labs, imaging, and re-evaluation. Per her HPI:   "Blake [name] is a 78 y.o. male with a hx of COPD, GERD, prior stroke, HTN, hyperlipidemia, seizure disorder, EtOH abuse, and T2DM who arrives to the ED via EMS for AMS w/ fall. Per EMS to triage team- EMS called for fall x 2- once last night and 1 x tonight. Roommate to EMS reported witnessed falls, no head injury. He informed EMS that patient has been confused over the past day, last known normal was 1900 04/13 (>24 hours prior).   LEVEL 5 CAVEAT TO AMS, PATIENT UNABLE TO PROVIDE HX.  Occasionally stating inappropriate words."   Patient was given Ativan for agitation prior to my examination.  Patient was sleeping soundly and arousable to loud voice and painful stimuli.  Nursing staff reported that prior to Ativan administration that the patient had been awake and walking around the room.  He was oriented to his name and the year of his birth, [birth year].  Physical Exam  BP (!) 103/49   Pulse 65   Temp 99.5 F (37.5 C) (Rectal)   Resp 17   Ht 5\' 10"  (1.778 m)   Wt 68 kg   SpO2 92%   BMI 21.52 kg/m   Physical Exam Vitals signs and nursing note reviewed.  Constitutional:      Appearance: He is well-developed. He is not toxic-appearing.     Comments: Cachectic, disheveled, chronically ill-appearing elderly gentleman.  HENT:     Head: Normocephalic.  Eyes:     Conjunctiva/sclera: Conjunctivae normal.     Comments: Left pupil is reactive to light.  Right pupil is enlarged and irregular.  Neck:     Comments: No meningismus Cardiovascular:     Rate and Rhythm: Normal rate.     Comments: Radial and DP pulses are 2+ and symmetric. Pulmonary:     Effort: Pulmonary effort is normal.     Breath sounds: No stridor.     Comments: Barrel chested Musculoskeletal: Normal range of motion.  Neurological:     Comments: Somnolent, but arousable to loud voice and painful stimuli.   Patient has been oriented to self since arrival in the ED.     ED Course/Procedures     .Critical Care Performed by: Joanne Gavel, PA-C Authorized by: Joanne Gavel, PA-C   Critical care provider statement:    Critical care time (minutes):  40   Critical care time was exclusive of:  Separately billable procedures and treating other patients and teaching time   Critical care was necessary to treat or prevent imminent or life-threatening deterioration of the following conditions:  CNS failure or compromise   Critical care was time spent personally by me on the following activities:  Ordering and performing treatments and interventions, ordering and review of laboratory studies, ordering and review of radiographic studies, pulse oximetry, re-evaluation of patient's condition, review of old charts, obtaining history from patient or surrogate and examination of patient   I assumed direction of critical care for this patient from another provider in my specialty: no      MDM   78 year old male received at signout from [facility] pending labs, imaging, and disposition.  The patient has a history of diabetes mellitus type 2, COPD, HLD, seizure disorder, complicated alcohol withdrawal, delirium, CVA, and HCAP.  I am unable to locate the contact information of the roommate who called EMS prior to the patient's arrival.  The patient was empirically started on antibiotics after he had a rectal temp of 100.5 on arrival to the ER.  He has had no tachycardia and has been normotensive and without hypoxia.  He was empirically started on vancomycin and Zosyn for suspected sepsis.  On exam, the patient is oriented to self only.  Exam is limited as he does not follow commands.  He has an irregular and slightly enlarged right pupil, but this appears chronic based on chart review secondary to previous eye surgery.  Neurologic exam is limited given the patient's ability to participate.  Per chart review,  the patient has presented to the ER previously with encephalopathy thought to be secondary to complicated alcohol withdrawal as well as seizure-like activity that were also thought to be secondary to alcohol withdrawal.  Most recently, the patient was seen for pneumonia in October 2019.  He has had no seizure-like activity since arrival to the ER.  Ethanol level is negative.  Ammonia is normal.    Labs are notable for leukocytosis of 13.9 with elevated neutrophil count of 10.6.  Lactate is initially elevated at 2.7.  UA with moderate bilirubin and ketones, likely secondary to dehydration.  Chest x-ray is unremarkable.  pH is 7.4.  CT head and cervical spine are negative.  UDS is unremarkable.  AST is elevated at approximately 2-1 2 ALT, which is chronic and likely secondary to chronic alcohol use.  Blood cultures x2 are pending.   Considered hyperammonemia, but ammonia was normal.  Differential diagnosis includes a CVA, polysubstance use, COPD exacerbation with hypercarbia, bacteremia, or encephalopathy secondary to complicated alcohol withdrawal my differential diagnosis.at this time, I have a low suspicion for meningitis given the patient's physical examination and lack of meningeal signs.  After examining the patient, I suspect rectal temperature of 100.5 is falsely elevated given that the patient was thrashing prior to obtaining this measurement.  The patient was given a banana bag given concern for Warnicke's encephalopathy.  Patient was discussed independently evaluated by Dr. Leonides Schanz, attending physician.  COVID-19 test is negative.  Discussed the patient with Dr. Hal Hope, hospitalist, who recommends MRI brain and plans to re-evaluate the patient after COVID-19 testing has resulted.   MRI with 8 mm acute/early subacute infarction within the right caudate head.  Dr. Hal Hope will admit. The patient appears reasonably stabilized for admission considering the current resources, flow, and capabilities  available in the ED at this time, and I doubt any other [facility] requiring further screening and/or treatment in the ED prior to admission.       Joline Maxcy A, PA-C 01/10/19 9628    Ward, Delice Bison, DO 01/10/19 3662

## 2019-01-09 NOTE — ED Notes (Signed)
Nurse will collect dark green for ammonia test.

## 2019-01-09 NOTE — ED Notes (Signed)
Attempted to collect pt's COVID swab. Pt was uncooperative, thrashing head side to side. Unable to collect. PA aware

## 2019-01-09 NOTE — ED Triage Notes (Signed)
Pt coming by [facility] after roommate called EMS after 2 falls, 1 fall last night and 1 this evening. Hx of stroke and pt verbalized "I am having a stroke". EMS found on on floor against wall. Roommate reports witnessed fall and that pt did not hit his head. Pt been confused over past day and last known normal was 1900 4/13. Pupil abnormality noted

## 2019-01-09 NOTE — ED Notes (Signed)
Pt placed on 2L [place] after Ativan admin

## 2019-01-09 NOTE — ED Provider Notes (Addendum)
[facility] EMERGENCY DEPARTMENT Provider Note   CSN: [id number] Arrival date & time: 01/09/19  2153    History   Chief Complaint Chief Complaint  Patient presents with  . Fall    HPI Blake [name] is a 78 y.o. male with a hx of COPD, GERD, prior stroke, HTN, hyperlipidemia, seizure disorder, EtOH abuse, and T2DM who arrives to the ED via EMS for AMS w/ fall. Per EMS to triage team- EMS called for fall x 2- once last night and 1 x tonight. Roommate to EMS reported witnessed falls, no head injury. He informed EMS that patient has been confused over the past day, last known normal was 1900 04/13 (>24 hours prior). No reports of seizure activity. EMS notes poor living conditions, bed bugs present.   LEVEL 5 CAVEAT TO AMS, PATIENT UNABLE TO PROVIDE HX.  Occasionally stating inappropriate words.     HPI  Past Medical History:  Diagnosis Date  . Chronic insomnia   . Cigarette smoker   . COPD (chronic obstructive pulmonary disease) ([place])   . Diverticulosis of colon   . Elevated prostate specific antigen (PSA)   . ETOH abuse   . GERD (gastroesophageal reflux disease)   . Hx of colonic polyps   . Hyperlipidemia   . Hypertension   . Pulmonary nodule   . Shortness of breath   . Stroke [facility])     Patient Active Problem List   Diagnosis Date Noted  . Esophageal stricture   . Abnormal finding on GI tract imaging   . Dysphagia 07/20/2018  . Malnutrition of moderate degree 07/10/2018  . Aspiration pneumonia ([place]) 07/07/2018  . Seizure disorder ([place]) 07/07/2018  . HLD (hyperlipidemia) 07/07/2018  . Acute hypoxemic respiratory failure ([place]) 07/07/2018  . Encounter for intubation   . Acute respiratory failure with hypercapnia ([place])   . HCAP (healthcare-associated pneumonia)   . Protein-calorie malnutrition, severe 06/26/2018  . Alcohol withdrawal seizure ([place]) 06/25/2018  . New onset seizure ([place]) 01/07/2018  . Hypertension 01/07/2018  . GERD (gastroesophageal  reflux disease) 01/07/2018  . Hyperlipidemia 01/07/2018  . Cigarette smoker 01/07/2018  . Alcohol abuse 01/07/2018  . Acute alcohol abuse, with delirium ([place])   . Acute encephalopathy 10/14/2014  . Unsteady gait 10/14/2014  . Alcohol withdrawal delirium ([place]) 10/14/2014  . Lipoma of neck 12/22/2012  . Cerebral atrophy ([place]) 09/28/2012  . Cerebrovascular small vessel disease 09/28/2012  . Left humeral fracture 07/04/2012  . Delirium 07/04/2012  . ETOH abuse   . ELEVATED PROSTATE SPECIFIC ANTIGEN 09/30/2010  . COPD (chronic obstructive pulmonary disease) ([place]) 01/28/2009  . PULMONARY NODULE 01/28/2009  . INSOMNIA, CHRONIC 10/15/2007  . COLONIC POLYPS 10/10/2007  . Type 2 diabetes mellitus ([place]) 10/10/2007  . Dyslipidemia 10/10/2007  . Essential hypertension 10/10/2007  . GERD 10/10/2007  . DIVERTICULOSIS OF COLON 10/10/2007    Past Surgical History:  Procedure Laterality Date  . COLONOSCOPY    . ESOPHAGOGASTRODUODENOSCOPY (EGD) WITH PROPOFOL N/A 07/21/2018   Procedure: ESOPHAGOGASTRODUODENOSCOPY (EGD) WITH PROPOFOL;  Surgeon: Lavena Bullion, DO;  Location: [facility];  Service: Gastroenterology;  Laterality: N/A;  . needle biopsy RUL nodule  01/2009   benign  . TONSILLECTOMY  1947  . VASECTOMY          Home Medications    Prior to Admission medications   Medication Sig Start Date End Date Taking? Authorizing Provider  acetaminophen (TYLENOL) 325 MG tablet Take 650 mg by mouth every 6 (six) hours as needed.  [provider]  aspirin 81 MG chewable tablet Chew 81 mg by mouth daily.    [provider]  Cholecalciferol (VITAMIN D-3) 25 MCG (1000 UT) CAPS Take 1 capsule (1,000 Units total) by mouth daily. 08/11/18   Medina-Vargas, Monina C, NP  folic acid (FOLVITE) 1 MG tablet Take 1 tablet (1 mg total) by mouth daily. 08/11/18   Medina-Vargas, Monina C, NP  gemfibrozil (LOPID) 600 MG tablet Take 1 tablet (600 mg total) by mouth 2 (two) times daily.  08/11/18   Medina-Vargas, Monina C, NP  ipratropium-albuterol (DUONEB) 0.5-2.5 (3) MG/3ML SOLN Take 3 mLs by nebulization See admin instructions. Nebulize and inhale 3 ml's into the lungs at 6 AM, 2 PM, and 10 PM for 5 days with a start date of 07/06/18 AND 3 ml's every six hours as needed for coughing 08/11/18   Medina-Vargas, Monina C, NP  levETIRAcetam (KEPPRA) 500 MG tablet Take 1 tablet (500 mg total) by mouth 2 (two) times daily. 08/11/18   Medina-Vargas, Monina C, NP  magnesium hydroxide (MILK OF MAGNESIA) 400 MG/5ML suspension Take 30 mLs by mouth once as needed for mild constipation.    [provider]  Melatonin 3 MG TABS Take 3 mg by mouth at bedtime.    [provider]  nicotine (NICODERM CQ - DOSED IN MG/24 HOURS) 21 mg/24hr patch Place 1 patch (21 mg total) onto the skin daily. 08/11/18   Medina-Vargas, Monina C, NP  Nutritional Supplements (NUTRITIONAL SUPPLEMENT PLUS) LIQD Take 120 mLs by mouth See admin instructions. MedPass: Drink 120 ml's by mouth two times a day    [provider]  QUEtiapine (SEROQUEL) 50 MG tablet Take 0.5 tablets (25 mg total) by mouth at bedtime. 08/11/18   Medina-Vargas, Monina C, NP  Sodium Phosphates (RA SALINE ENEMA) 19-7 GM/118ML ENEM Place 1 enema rectally once as needed (for constipation not relieved by Dulcolax suppository and call MD if no relief from enema).    [provider]  thiamine 100 MG tablet Take 1 tablet (100 mg total) by mouth daily. 08/11/18   Medina-Vargas, Senaida Lange, NP    Family History Family History  Problem Relation Age of Onset  . Diabetes Father   . Lung cancer Brother   . Colon cancer Neg Hx   . Esophageal cancer Neg Hx   . Stomach cancer Neg Hx     Social History Social History   Tobacco Use  . Smoking status: Current Every Day Smoker    Packs/day: 1.00    Years: 55.00    Pack years: 55.00    Types: Cigarettes  . Smokeless tobacco: Never Used  . Tobacco comment: not smoking @  SNF  Substance Use Topics  . Alcohol use: Yes    Alcohol/week: 24.0 standard drinks    Types: 24 Cans of beer per week    Comment: 12-24 beer daily  . Drug use: No     Allergies   Patient has no known allergies.   Review of Systems Review of Systems  Unable to perform ROS: Mental status change     Physical Exam Updated Vital Signs BP 118/69   Pulse (!) 58   Temp (!) 100.4 F (38 C) (Rectal)   Resp 20   SpO2 99%   Physical Exam Vitals signs and nursing note reviewed.  Constitutional:      General: He is awake. He is not in acute distress.    Appearance: He is well-developed. He is cachectic. He is  ill-appearing (chronically).     Comments: unkempt  HENT:     Head: Normocephalic and atraumatic. No raccoon eyes or Battle's sign.  Eyes:     General:        Right eye: No discharge.        Left eye: No discharge.     Conjunctiva/sclera: Conjunctivae normal.     Comments: R pupil is irregular/enlarged. L pupil reactive to light.   Neck:     Musculoskeletal: Normal range of motion and neck supple. No neck rigidity or spinous process tenderness.  Cardiovascular:     Rate and Rhythm: Normal rate and regular rhythm.     Comments: 2+ radial pulses.  Pulmonary:     Effort: Pulmonary effort is normal. No respiratory distress.     Breath sounds: Normal breath sounds. No wheezing, rhonchi or rales.  Chest:     Chest wall: No tenderness.  Abdominal:     General: There is no distension.     Palpations: Abdomen is soft.     Tenderness: There is no abdominal tenderness.  Musculoskeletal:     Comments: Multiple small excoriations noted to the LEs without surrounding erythema/warmth. Onychomycosis changes noted to the toe nails. LUE: skin tear to proximal forearm, no active bleeding, moving elbow without difficulty, nontender.   Skin:    General: Skin is warm and dry.     Findings: No rash.  Neurological:     Mental Status: He is alert.     GCS: GCS eye subscore is 4. GCS  verbal subscore is 3. GCS motor subscore is 5.     Comments: Intermittently w/ inappropriate words. Protecting airway. Does not answer questions appropriately or cooperate w/ neuro exam. Moving all extremities somewhat. No active seizure activity. Exam limited.   Psychiatric:        Behavior: Behavior normal.    ED Treatments / Results  Labs (all labs ordered are listed, but only abnormal results are displayed) Labs Reviewed  SARS CORONAVIRUS 2 (HOSPITAL ORDER, [facility])  COMPREHENSIVE METABOLIC PANEL  CBC WITH DIFFERENTIAL/PLATELET  URINALYSIS, COMPLETE (UACMP) WITH MICROSCOPIC  ETHANOL  RAPID URINE DRUG SCREEN, HOSP PERFORMED  AMMONIA  LEVETIRACETAM LEVEL  LACTIC ACID, PLASMA  LACTIC ACID, PLASMA  PROTIME-INR  CBG MONITORING, ED  CBG MONITORING, ED    EKG EKG Interpretation  Date/Time:  Tuesday January 09 2019 22:05:56 EDT Ventricular Rate:  73 PR Interval:    QRS Duration: 116 QT Interval:  419 QTC Calculation: 462 R Axis:   -68 Text Interpretation:  Sinus or ectopic atrial rhythm Prolonged PR interval Left anterior fascicular block Probable anteroseptal infarct, old Minimal ST elevation, lateral leads Confirmed by Dene Gentry [phone]) on 01/09/2019 10:22:19 PM   Radiology No results found.  Procedures Procedures (including critical care time)  Medications Ordered in ED Medications  LORazepam (ATIVAN) injection 1 mg (1 mg Intramuscular Given 01/09/19 2322)    Initial Impression / Assessment and Plan / ED Course  I have reviewed the triage vital signs and the nursing notes.  Pertinent labs & imaging results that were available during my care of the patient were reviewed by me and considered in my medical decision making (see chart for details).   Patient presents to the ED with [place]. Last known normal > 24 hours prior, therefore not a code stroke.  Chronically ill appearing, altered, uncooperative w/ neuro exam, GCS of 12,  protecting airway. Initial vitals without significant abnormality. DDX: ICH,  ischemic CVA,  metabolic disturbance, infectious process, EtOH intoxication, illicit substance use, s/p seizurel. Plan for rectal temp, labs, CT head/neck w/ fall & inability to provide hx, CXR & EKG.   Rectal temp elevated at 100.4  23:17: Per RN patient became very agitated w/ attempt at nasopharyngeal swab-- standing at the door naked, stating his name repeatedly, RN requesting something for agitation, 1 mg of IM ativan ordered.   23:20: RE-EVAL: Patient now w/ increased mobility & speaking-  primarily stating his name, does state that year is 38. Remains confused. Pending work-up.   CBG: 85 CXR: Negative CBC: Leukocytosis @ 16.8 w/ L shift. No anemia.  CMP: Mildly low bicarb @ 18. No significant abnormality w/ LFTs/renal function.  PT/INR: WNL.  Ethanol: WNL Lactic acid: Elevated @ 2.7. --- 1L of fluids ordered.   Suppository tylenol ordered for fever.  RN plans for in and out cath when back from CT.   00:00: Patient care signed out to [place] PA-C at change of shift pending remaining work-up, re-eval, and appropriate disposition.   This is a shared visit with supervising physician Dr. Francia Greaves who has independently evaluated patient & provided guidance in evaluation/management.    Final Clinical Impressions(s) / ED Diagnoses   Final diagnoses:  None    ED Discharge Orders    None       Amaryllis Dyke, PA-C 01/10/19 0001    Mataya Kilduff, Glynda Jaeger, PA-C 01/10/19 0004    Amaryllis Dyke, PA-C 01/10/19 0011    Valarie Merino, MD 01/10/19 1701

## 2019-01-09 NOTE — ED Notes (Signed)
Pt transported to CT on monitor.

## 2019-01-10 ENCOUNTER — Other Ambulatory Visit (HOSPITAL_COMMUNITY): Payer: Medicare Other

## 2019-01-10 ENCOUNTER — Inpatient Hospital Stay (HOSPITAL_COMMUNITY): Payer: Medicare Other

## 2019-01-10 ENCOUNTER — Encounter (HOSPITAL_COMMUNITY): Payer: Medicare Other

## 2019-01-10 ENCOUNTER — Encounter (HOSPITAL_COMMUNITY): Payer: Self-pay | Admitting: Internal Medicine

## 2019-01-10 ENCOUNTER — Emergency Department (HOSPITAL_COMMUNITY): Payer: Medicare Other

## 2019-01-10 DIAGNOSIS — K5730 Diverticulosis of large intestine without perforation or abscess without bleeding: Secondary | ICD-10-CM | POA: Diagnosis present

## 2019-01-10 DIAGNOSIS — Z8719 Personal history of other diseases of the digestive system: Secondary | ICD-10-CM | POA: Diagnosis not present

## 2019-01-10 DIAGNOSIS — R4182 Altered mental status, unspecified: Secondary | ICD-10-CM | POA: Diagnosis present

## 2019-01-10 DIAGNOSIS — G40909 Epilepsy, unspecified, not intractable, without status epilepticus: Secondary | ICD-10-CM | POA: Diagnosis present

## 2019-01-10 DIAGNOSIS — Z833 Family history of diabetes mellitus: Secondary | ICD-10-CM | POA: Diagnosis not present

## 2019-01-10 DIAGNOSIS — Z8673 Personal history of transient ischemic attack (TIA), and cerebral infarction without residual deficits: Secondary | ICD-10-CM | POA: Diagnosis not present

## 2019-01-10 DIAGNOSIS — I351 Nonrheumatic aortic (valve) insufficiency: Secondary | ICD-10-CM | POA: Diagnosis not present

## 2019-01-10 DIAGNOSIS — J449 Chronic obstructive pulmonary disease, unspecified: Secondary | ICD-10-CM | POA: Diagnosis present

## 2019-01-10 DIAGNOSIS — R509 Fever, unspecified: Secondary | ICD-10-CM | POA: Diagnosis present

## 2019-01-10 DIAGNOSIS — F10231 Alcohol dependence with withdrawal delirium: Secondary | ICD-10-CM | POA: Diagnosis present

## 2019-01-10 DIAGNOSIS — D72829 Elevated white blood cell count, unspecified: Secondary | ICD-10-CM | POA: Diagnosis present

## 2019-01-10 DIAGNOSIS — I639 Cerebral infarction, unspecified: Secondary | ICD-10-CM | POA: Diagnosis present

## 2019-01-10 DIAGNOSIS — I10 Essential (primary) hypertension: Secondary | ICD-10-CM | POA: Diagnosis present

## 2019-01-10 DIAGNOSIS — F17210 Nicotine dependence, cigarettes, uncomplicated: Secondary | ICD-10-CM | POA: Diagnosis present

## 2019-01-10 DIAGNOSIS — E785 Hyperlipidemia, unspecified: Secondary | ICD-10-CM | POA: Diagnosis present

## 2019-01-10 DIAGNOSIS — R1314 Dysphagia, pharyngoesophageal phase: Secondary | ICD-10-CM | POA: Diagnosis present

## 2019-01-10 DIAGNOSIS — E860 Dehydration: Secondary | ICD-10-CM | POA: Diagnosis present

## 2019-01-10 DIAGNOSIS — F1010 Alcohol abuse, uncomplicated: Secondary | ICD-10-CM

## 2019-01-10 DIAGNOSIS — Z79899 Other long term (current) drug therapy: Secondary | ICD-10-CM | POA: Diagnosis not present

## 2019-01-10 DIAGNOSIS — Z7982 Long term (current) use of aspirin: Secondary | ICD-10-CM | POA: Diagnosis not present

## 2019-01-10 DIAGNOSIS — Z20828 Contact with and (suspected) exposure to other viral communicable diseases: Secondary | ICD-10-CM | POA: Diagnosis present

## 2019-01-10 DIAGNOSIS — F5104 Psychophysiologic insomnia: Secondary | ICD-10-CM | POA: Diagnosis present

## 2019-01-10 DIAGNOSIS — K219 Gastro-esophageal reflux disease without esophagitis: Secondary | ICD-10-CM | POA: Diagnosis present

## 2019-01-10 DIAGNOSIS — E1151 Type 2 diabetes mellitus with diabetic peripheral angiopathy without gangrene: Secondary | ICD-10-CM | POA: Diagnosis present

## 2019-01-10 DIAGNOSIS — Z801 Family history of malignant neoplasm of trachea, bronchus and lung: Secondary | ICD-10-CM | POA: Diagnosis not present

## 2019-01-10 DIAGNOSIS — G9341 Metabolic encephalopathy: Secondary | ICD-10-CM | POA: Diagnosis present

## 2019-01-10 DIAGNOSIS — R296 Repeated falls: Secondary | ICD-10-CM | POA: Diagnosis present

## 2019-01-10 LAB — LIPID PANEL
Cholesterol: 131 mg/dL (ref 0–200)
HDL: 49 mg/dL (ref >40)
LDL Cholesterol: 69 mg/dL (ref 0–99)
Total CHOL/HDL Ratio: 2.7 RATIO
Triglycerides: 66 mg/dL (ref <150)
VLDL: 13 mg/dL (ref 0–40)

## 2019-01-10 LAB — URINALYSIS, COMPLETE (UACMP) WITH MICROSCOPIC
Bacteria, UA: NONE SEEN
Glucose, UA: NEGATIVE mg/dL
Ketones, ur: 40 mg/dL — AB
Leukocytes,Ua: NEGATIVE
Nitrite: NEGATIVE
Protein, ur: NEGATIVE mg/dL
Specific Gravity, Urine: 1.025 (ref 1.005–1.030)
pH: 5.5 (ref 5.0–8.0)

## 2019-01-10 LAB — CBC WITH DIFFERENTIAL/PLATELET
Abs Immature Granulocytes: 0.06 10*3/uL (ref 0.00–0.07)
Basophils Absolute: 0 10*3/uL (ref 0.0–0.1)
Basophils Relative: 0 %
Eosinophils Absolute: 0.1 10*3/uL (ref 0.0–0.5)
Eosinophils Relative: 1 %
HCT: 39.8 % (ref 39.0–52.0)
Hemoglobin: 13.7 g/dL (ref 13.0–17.0)
Immature Granulocytes: 0 %
Lymphocytes Relative: 12 %
Lymphs Abs: 1.7 10*3/uL (ref 0.7–4.0)
MCH: 33.2 pg (ref 26.0–34.0)
MCHC: 34.4 g/dL (ref 30.0–36.0)
MCV: 96.4 fL (ref 80.0–100.0)
Monocytes Absolute: 1.4 10*3/uL — ABNORMAL HIGH (ref 0.1–1.0)
Monocytes Relative: 10 %
Neutro Abs: 10.6 10*3/uL — ABNORMAL HIGH (ref 1.7–7.7)
Neutrophils Relative %: 77 %
Platelets: 197 10*3/uL (ref 150–400)
RBC: 4.13 MIL/uL — ABNORMAL LOW (ref 4.22–5.81)
RDW: 13.4 % (ref 11.5–15.5)
WBC: 13.9 10*3/uL — ABNORMAL HIGH (ref 4.0–10.5)
nRBC: 0 % (ref 0.0–0.2)

## 2019-01-10 LAB — COMPREHENSIVE METABOLIC PANEL
ALT: 16 U/L (ref 0–44)
AST: 39 U/L (ref 15–41)
Albumin: 3.6 g/dL (ref 3.5–5.0)
Alkaline Phosphatase: 71 U/L (ref 38–126)
Anion gap: 13 (ref 5–15)
BUN: 16 mg/dL (ref 8–23)
CO2: 18 mmol/L — ABNORMAL LOW (ref 22–32)
Calcium: 9.1 mg/dL (ref 8.9–10.3)
Chloride: 104 mmol/L (ref 98–111)
Creatinine, Ser: 1.09 mg/dL (ref 0.61–1.24)
GFR calc Af Amer: >60 mL/min (ref >60)
GFR calc non Af Amer: >60 mL/min (ref >60)
Glucose, Bld: 90 mg/dL (ref 70–99)
Potassium: 4.4 mmol/L (ref 3.5–5.1)
Sodium: 135 mmol/L (ref 135–145)
Total Bilirubin: 2.1 mg/dL — ABNORMAL HIGH (ref 0.3–1.2)
Total Protein: 5.9 g/dL — ABNORMAL LOW (ref 6.5–8.1)

## 2019-01-10 LAB — RAPID URINE DRUG SCREEN, HOSP PERFORMED
Amphetamines: NOT DETECTED
Barbiturates: NOT DETECTED
Benzodiazepines: NOT DETECTED
Cocaine: NOT DETECTED
Opiates: NOT DETECTED
Tetrahydrocannabinol: NOT DETECTED

## 2019-01-10 LAB — POCT I-STAT 7, (LYTES, BLD GAS, ICA,H+H)
Acid-base deficit: 5 mmol/L — ABNORMAL HIGH (ref 0.0–2.0)
Bicarbonate: 18.3 mmol/L — ABNORMAL LOW (ref 20.0–28.0)
Calcium, Ion: 1.26 mmol/L (ref 1.15–1.40)
HCT: 43 % (ref 39.0–52.0)
Hemoglobin: 14.6 g/dL (ref 13.0–17.0)
O2 Saturation: 96 %
Patient temperature: 100.4
Potassium: 4.1 mmol/L (ref 3.5–5.1)
Sodium: 135 mmol/L (ref 135–145)
TCO2: 19 mmol/L — ABNORMAL LOW (ref 22–32)
pCO2 arterial: 29.4 mmHg — ABNORMAL LOW (ref 32.0–48.0)
pH, Arterial: 7.405 (ref 7.350–7.450)
pO2, Arterial: 85 mmHg (ref 83.0–108.0)

## 2019-01-10 LAB — TROPONIN I: Troponin I: 0.03 ng/mL (ref <0.03)

## 2019-01-10 LAB — HEMOGLOBIN A1C
Hgb A1c MFr Bld: 4.7 % — ABNORMAL LOW (ref 4.8–5.6)
Mean Plasma Glucose: 88.19 mg/dL

## 2019-01-10 LAB — ECHOCARDIOGRAM COMPLETE
Height: 70 in
Weight: 2400 oz

## 2019-01-10 LAB — AMMONIA: Ammonia: 19 umol/L (ref 9–35)

## 2019-01-10 LAB — LACTIC ACID, PLASMA: Lactic Acid, Venous: 1.7 mmol/L (ref 0.5–1.9)

## 2019-01-10 LAB — CK: Total CK: 766 U/L — ABNORMAL HIGH (ref 49–397)

## 2019-01-10 LAB — TSH: TSH: 0.391 u[IU]/mL (ref 0.350–4.500)

## 2019-01-10 LAB — SARS CORONAVIRUS 2 BY RT PCR (HOSPITAL ORDER, PERFORMED IN ~~LOC~~ HOSPITAL LAB): SARS Coronavirus 2: NEGATIVE

## 2019-01-10 MED ORDER — LORAZEPAM 1 MG PO TABS
1.0000 mg | ORAL_TABLET | Freq: Four times a day (QID) | ORAL | Status: AC | PRN
  Administered 2019-01-11: 1 mg via ORAL
  Filled 2019-01-10: qty 1

## 2019-01-10 MED ORDER — ORAL CARE MOUTH RINSE
15.0000 mL | Freq: Two times a day (BID) | OROMUCOSAL | Status: DC
  Administered 2019-01-11 – 2019-01-12 (×3): 15 mL via OROMUCOSAL

## 2019-01-10 MED ORDER — ASPIRIN 325 MG PO TABS
325.0000 mg | ORAL_TABLET | Freq: Every day | ORAL | Status: DC
  Administered 2019-01-10 – 2019-01-13 (×4): 325 mg via ORAL
  Filled 2019-01-10 (×4): qty 1

## 2019-01-10 MED ORDER — THIAMINE HCL 100 MG/ML IJ SOLN
Freq: Once | INTRAVENOUS | Status: AC
  Administered 2019-01-10: 02:00:00 via INTRAVENOUS
  Filled 2019-01-10: qty 1000

## 2019-01-10 MED ORDER — THIAMINE HCL 100 MG/ML IJ SOLN
250.0000 mg | Freq: Three times a day (TID) | INTRAVENOUS | Status: DC
  Administered 2019-01-12 – 2019-01-13 (×2): 250 mg via INTRAVENOUS
  Filled 2019-01-10 (×4): qty 2.5
  Filled 2019-01-10: qty 2

## 2019-01-10 MED ORDER — LORAZEPAM 2 MG/ML IJ SOLN: 1.0000 mg | Freq: Four times a day (QID) | INTRAMUSCULAR | Status: AC | PRN

## 2019-01-10 MED ORDER — SODIUM CHLORIDE 0.9 % IV SOLN
INTRAVENOUS | Status: AC
  Administered 2019-01-10 (×2): via INTRAVENOUS

## 2019-01-10 MED ORDER — FOLIC ACID 1 MG PO TABS
1.0000 mg | ORAL_TABLET | Freq: Every day | ORAL | Status: DC
  Administered 2019-01-10 – 2019-01-13 (×4): 1 mg via ORAL
  Filled 2019-01-10 (×4): qty 1

## 2019-01-10 MED ORDER — IOHEXOL 350 MG/ML SOLN
75.0000 mL | Freq: Once | INTRAVENOUS | Status: AC | PRN
  Administered 2019-01-10: 75 mL via INTRAVENOUS

## 2019-01-10 MED ORDER — VITAMIN B-1 100 MG PO TABS: 100.0000 mg | ORAL_TABLET | Freq: Every day | ORAL | Status: DC

## 2019-01-10 MED ORDER — ENOXAPARIN SODIUM 40 MG/0.4ML ~~LOC~~ SOLN: 40.0000 mg | SUBCUTANEOUS | Status: DC

## 2019-01-10 MED ORDER — ACETAMINOPHEN 650 MG RE SUPP: 650.0000 mg | RECTAL | Status: DC | PRN

## 2019-01-10 MED ORDER — ACETAMINOPHEN 160 MG/5ML PO SOLN: 650.0000 mg | ORAL | Status: DC | PRN

## 2019-01-10 MED ORDER — LEVETIRACETAM IN NACL 500 MG/100ML IV SOLN
500.0000 mg | Freq: Two times a day (BID) | INTRAVENOUS | Status: DC
  Administered 2019-01-10 – 2019-01-13 (×7): 500 mg via INTRAVENOUS
  Filled 2019-01-10 (×7): qty 100

## 2019-01-10 MED ORDER — VANCOMYCIN HCL 10 G IV SOLR
1750.0000 mg | INTRAVENOUS | Status: DC
  Administered 2019-01-11: 1750 mg via INTRAVENOUS
  Filled 2019-01-10 (×2): qty 1750

## 2019-01-10 MED ORDER — ASPIRIN 300 MG RE SUPP
300.0000 mg | Freq: Every day | RECTAL | Status: DC
  Filled 2019-01-10: qty 1

## 2019-01-10 MED ORDER — PIPERACILLIN-TAZOBACTAM 3.375 G IVPB
3.3750 g | Freq: Three times a day (TID) | INTRAVENOUS | Status: DC
  Administered 2019-01-10 – 2019-01-11 (×4): 3.375 g via INTRAVENOUS
  Filled 2019-01-10 (×8): qty 50

## 2019-01-10 MED ORDER — VANCOMYCIN HCL IN DEXTROSE 1-5 GM/200ML-% IV SOLN
1000.0000 mg | Freq: Once | INTRAVENOUS | Status: AC
  Administered 2019-01-10: 1000 mg via INTRAVENOUS
  Filled 2019-01-10: qty 200

## 2019-01-10 MED ORDER — PIPERACILLIN-TAZOBACTAM 3.375 G IVPB 30 MIN
3.3750 g | Freq: Once | INTRAVENOUS | Status: AC
  Administered 2019-01-10: 3.375 g via INTRAVENOUS
  Filled 2019-01-10: qty 50

## 2019-01-10 MED ORDER — ACETAMINOPHEN 325 MG PO TABS: 650.0000 mg | ORAL_TABLET | ORAL | Status: DC | PRN

## 2019-01-10 MED ORDER — ADULT MULTIVITAMIN W/MINERALS CH
1.0000 | ORAL_TABLET | Freq: Every day | ORAL | Status: DC
  Administered 2019-01-10 – 2019-01-13 (×4): 1 via ORAL
  Filled 2019-01-10 (×4): qty 1

## 2019-01-10 MED ORDER — THIAMINE HCL 100 MG/ML IJ SOLN
500.0000 mg | Freq: Three times a day (TID) | INTRAVENOUS | Status: AC
  Administered 2019-01-10 – 2019-01-12 (×9): 500 mg via INTRAVENOUS
  Filled 2019-01-10 (×4): qty 5
  Filled 2019-01-10: qty 4
  Filled 2019-01-10 (×5): qty 5
  Filled 2019-01-10: qty 2

## 2019-01-10 MED ORDER — STROKE: EARLY STAGES OF RECOVERY BOOK: Freq: Once | Status: DC

## 2019-01-10 MED ORDER — CHLORHEXIDINE GLUCONATE 0.12 % MT SOLN
15.0000 mL | Freq: Two times a day (BID) | OROMUCOSAL | Status: DC
  Administered 2019-01-10 – 2019-01-13 (×6): 15 mL via OROMUCOSAL
  Filled 2019-01-10 (×5): qty 15

## 2019-01-10 NOTE — ED Notes (Signed)
ED TO INPATIENT HANDOFF REPORT  ED Nurse Name and Phone #: [phone] Lucita Ferrara Name/Age/Gender Blake [name] 78 y.o. male Room/Bed: 031C/031C  Code Status   Code Status: Prior  Home/SNF/Other Home Patient oriented to: self Is this baseline? No   Triage Complete: Triage complete  Chief Complaint fall  Triage Note Pt coming by [facility] after roommate called EMS after 2 falls, 1 fall last night and 1 this evening. Hx of stroke and pt verbalized "I am having a stroke". EMS found on on floor against wall. Roommate reports witnessed fall and that pt did not hit his head. Pt been confused over past day and last known normal was 1900 4/13. Pupil abnormality noted   Allergies No Known Allergies  Level of Care/Admitting Diagnosis ED Disposition    ED Disposition Condition Comment   Admit  The patient appears reasonably stabilized for admission considering the current resources, flow, and capabilities available in the ED at this time, and I doubt any other [facility] requiring further screening and/or treatment in the ED prior to admission is  present.       B Medical/Surgery History Past Medical History:  Diagnosis Date  . Chronic insomnia   . Cigarette smoker   . COPD (chronic obstructive pulmonary disease) ([place])   . Diverticulosis of colon   . Elevated prostate specific antigen (PSA)   . ETOH abuse   . GERD (gastroesophageal reflux disease)   . Hx of colonic polyps   . Hyperlipidemia   . Hypertension   . Pulmonary nodule   . Shortness of breath   . Stroke [facility])    Past Surgical History:  Procedure Laterality Date  . COLONOSCOPY    . ESOPHAGOGASTRODUODENOSCOPY (EGD) WITH PROPOFOL N/A 07/21/2018   Procedure: ESOPHAGOGASTRODUODENOSCOPY (EGD) WITH PROPOFOL;  Surgeon: Lavena Bullion, DO;  Location: [place];  Service: Gastroenterology;  Laterality: N/A;  . needle biopsy RUL nodule  01/2009   benign  . TONSILLECTOMY  1947  . VASECTOMY       A IV  Location/Drains/Wounds Patient Lines/Drains/Airways Status   Active Line/Drains/Airways    Name:   Placement date:   Placement time:   Site:   Days:   Peripheral IV 01/10/19 Right Hand   01/10/19    -    Hand   less than 1   Peripheral IV 01/10/19 Posterior;Right Forearm   01/10/19    0032    Forearm   less than 1   Wound 05/07/13 Laceration Ear Right   05/07/13    2340    Ear   2074   Wound 05/07/13 Skin tear Arm Right   05/07/13    2341    Arm   2074          Intake/Output Last 24 hours No intake or output data in the 24 hours ending 01/10/19 0242  Labs/Imaging Results for orders placed or performed during the hospital encounter of 01/09/19 (from the past 48 hour(s))  Comprehensive metabolic panel     Status: Abnormal   Collection Time: 01/09/19 10:18 PM  Result Value Ref Range   Sodium 136 135 - 145 mmol/L   Potassium 4.7 3.5 - 5.1 mmol/L   Chloride 104 98 - 111 mmol/L   CO2 18 (L) 22 - 32 mmol/L   Glucose, Bld 92 70 - 99 mg/dL   BUN 15 8 - 23 mg/dL   Creatinine, Ser 1.16 0.61 - 1.24 mg/dL   Calcium 9.6 8.9 - 10.3 mg/dL  Total Protein 6.7 6.5 - 8.1 g/dL   Albumin 4.2 3.5 - 5.0 g/dL   AST 44 (H) 15 - 41 U/L   ALT 14 0 - 44 U/L   Alkaline Phosphatase 90 38 - 126 U/L   Total Bilirubin 1.9 (H) 0.3 - 1.2 mg/dL   GFR calc non Af Amer >60 >60 mL/min   GFR calc Af Amer >60 >60 mL/min   Anion gap 14 5 - 15    Comment: Performed at [facility] [address], [place], [place] [address]  CBC WITH DIFFERENTIAL     Status: Abnormal   Collection Time: 01/09/19 10:18 PM  Result Value Ref Range   WBC 16.8 (H) 4.0 - 10.5 K/uL   RBC 4.85 4.22 - 5.81 MIL/uL   Hemoglobin 15.4 13.0 - 17.0 g/dL   HCT 46.7 39.0 - 52.0 %   MCV 96.3 80.0 - 100.0 fL   MCH 31.8 26.0 - 34.0 pg   MCHC 33.0 30.0 - 36.0 g/dL   RDW 13.2 11.5 - 15.5 %   Platelets 237 150 - 400 K/uL   nRBC 0.0 0.0 - 0.2 %   Neutrophils Relative % 81 %   Neutro Abs 13.7 (H) 1.7 - 7.7 K/uL   Lymphocytes Relative 10 %    Lymphs Abs 1.6 0.7 - 4.0 K/uL   Monocytes Relative 8 %   Monocytes Absolute 1.3 (H) 0.1 - 1.0 K/uL   Eosinophils Relative 0 %   Eosinophils Absolute 0.1 0.0 - 0.5 K/uL   Basophils Relative 0 %   Basophils Absolute 0.0 0.0 - 0.1 K/uL   Immature Granulocytes 1 %   Abs Immature Granulocytes 0.14 (H) 0.00 - 0.07 K/uL    Comment: Performed at [facility] [address], [place], [place] [address]  Ethanol     Status: None   Collection Time: 01/09/19 10:18 PM  Result Value Ref Range   Alcohol, Ethyl (B) <10 <10 mg/dL    Comment: (NOTE) Lowest detectable limit for serum alcohol is 10 mg/dL. For medical purposes only. Performed at [facility], [place] [address] [place], [place] [address]   CBG monitoring, ED     Status: None   Collection Time: 01/09/19 10:57 PM  Result Value Ref Range   Glucose-Capillary 85 70 - 99 mg/dL  Lactic acid, plasma     Status: Abnormal   Collection Time: 01/09/19 10:58 PM  Result Value Ref Range   Lactic Acid, Venous 2.7 (HH) 0.5 - 1.9 mmol/L    Comment: CRITICAL RESULT CALLED TO, READ BACK BY AND VERIFIED WITH: Rogelio Waynick S,.RN 047/14/20 2350 WAYK Performed at [facility], [place] [address] [place], [place] [address]   Protime-INR     Status: None   Collection Time: 01/09/19 11:14 PM  Result Value Ref Range   Prothrombin Time 13.2 11.4 - 15.2 seconds   INR 1.0 0.8 - 1.2    Comment: (NOTE) INR goal varies based on device and disease states. Performed at [facility], [place] [address], [place], [place] [address]   I-STAT 7, (LYTES, BLD GAS, ICA, H+H)     Status: Abnormal   Collection Time: 01/10/19 12:22 AM  Result Value Ref Range   pH, Arterial 7.405 7.350 - 7.450   pCO2 arterial 29.4 (L) 32.0 - 48.0 mmHg   pO2, Arterial 85.0 83.0 - 108.0 mmHg   Bicarbonate 18.3 (L) 20.0 - 28.0 mmol/L   TCO2 19 (L) 22 -  32 mmol/L   O2 Saturation 96.0 %   Acid-base deficit 5.0 (H) 0.0 - 2.0 mmol/L   Sodium 135 135 - 145 mmol/L   Potassium  4.1 3.5 - 5.1 mmol/L   Calcium, Ion 1.26 1.15 - 1.40 mmol/L   HCT 43.0 39.0 - 52.0 %   Hemoglobin 14.6 13.0 - 17.0 g/dL   Patient temperature 100.4 F    Collection site RADIAL, ALLEN'S TEST ACCEPTABLE    Drawn by RT    Sample type ARTERIAL   Urinalysis, Complete w Microscopic     Status: Abnormal   Collection Time: 01/10/19 12:24 AM  Result Value Ref Range   Color, Urine YELLOW YELLOW   APPearance CLEAR CLEAR   Specific Gravity, Urine 1.025 1.005 - 1.030   pH 5.5 5.0 - 8.0   Glucose, UA NEGATIVE NEGATIVE mg/dL   Hgb urine dipstick SMALL (A) NEGATIVE   Bilirubin Urine MODERATE (A) NEGATIVE   Ketones, ur 40 (A) NEGATIVE mg/dL   Protein, ur NEGATIVE NEGATIVE mg/dL   Nitrite NEGATIVE NEGATIVE   Leukocytes,Ua NEGATIVE NEGATIVE   Squamous Epithelial / LPF 0-5 0 - 5   WBC, UA 0-5 0 - 5 WBC/hpf   RBC / HPF 11-20 0 - 5 RBC/hpf   Bacteria, UA NONE SEEN NONE SEEN    Comment: Performed at [facility], [address], [place], [place] [address]  Rapid urine drug screen (hospital performed)     Status: None   Collection Time: 01/10/19 12:24 AM  Result Value Ref Range   Opiates NONE DETECTED NONE DETECTED   Cocaine NONE DETECTED NONE DETECTED   Benzodiazepines NONE DETECTED NONE DETECTED   Amphetamines NONE DETECTED NONE DETECTED   Tetrahydrocannabinol NONE DETECTED NONE DETECTED   Barbiturates NONE DETECTED NONE DETECTED    Comment: (NOTE) DRUG SCREEN FOR MEDICAL PURPOSES ONLY.  IF CONFIRMATION IS NEEDED FOR ANY PURPOSE, NOTIFY LAB WITHIN 5 DAYS. LOWEST DETECTABLE LIMITS FOR URINE DRUG SCREEN Drug Class                     Cutoff (ng/mL) Amphetamine and metabolites    1000 Barbiturate and metabolites    200 Benzodiazepine                 235 Tricyclics and metabolites     300 Opiates and metabolites        300 Cocaine and metabolites        300 THC                            50 Performed at [facility], [place] [address], [place],  [address]   Ammonia      Status: None   Collection Time: 01/10/19 12:55 AM  Result Value Ref Range   Ammonia 19 9 - 35 umol/L    Comment: Performed at [facility], [place] [address], [place], [place] [address]  Lactic acid, plasma     Status: None   Collection Time: 01/10/19  1:11 AM  Result Value Ref Range   Lactic Acid, Venous 1.7 0.5 - 1.9 mmol/L    Comment: Performed at [facility] [address], [place], [place] [address]   Ct Head Wo Contrast  Result Date: 01/10/2019 CLINICAL DATA:  Recent falls EXAM: CT HEAD WITHOUT CONTRAST CT CERVICAL SPINE WITHOUT CONTRAST TECHNIQUE: Multidetector CT imaging of the head and cervical spine was performed following  the standard protocol without intravenous contrast. Multiplanar CT image reconstructions of the cervical spine were also generated. COMPARISON:  06/25/2018 FINDINGS: CT HEAD FINDINGS Brain: Chronic atrophic and ischemic changes are noted. No findings to suggest acute hemorrhage, acute infarction or space-occupying mass lesion are noted. Vascular: No hyperdense vessel or unexpected calcification. Skull: Normal. Negative for fracture or focal lesion. Sinuses/Orbits: Postsurgical changes in the anterior wall of the left maxillary antrum and lateral wall left orbit are again seen and stable. Other: None CT CERVICAL SPINE FINDINGS Alignment: Within normal limits. Skull base and vertebrae: 7 cervical segments are well visualized. Vertebral body height is well maintained. Disc space narrowing is noted from C3-C7 with mild osteophytic changes. Mild facet hypertrophic changes are noted. No acute fracture or acute facet abnormality is noted. Mild neural foraminal narrowing is noted bilaterally. Soft tissues and spinal canal: Surrounding soft tissues are within normal limits. Upper chest: Visualized lung apices are unremarkable. Other: None IMPRESSION: CT of the head: Chronic atrophic and ischemic changes without acute intracranial abnormality. CT of the cervical spine:  Multilevel degenerative change without acute abnormality. Electronically Signed   By: [place] Catalina M.D.   On: 01/10/2019 01:17   Ct Cervical Spine Wo Contrast  Result Date: 01/10/2019 CLINICAL DATA:  Recent falls EXAM: CT HEAD WITHOUT CONTRAST CT CERVICAL SPINE WITHOUT CONTRAST TECHNIQUE: Multidetector CT imaging of the head and cervical spine was performed following the standard protocol without intravenous contrast. Multiplanar CT image reconstructions of the cervical spine were also generated. COMPARISON:  06/25/2018 FINDINGS: CT HEAD FINDINGS Brain: Chronic atrophic and ischemic changes are noted. No findings to suggest acute hemorrhage, acute infarction or space-occupying mass lesion are noted. Vascular: No hyperdense vessel or unexpected calcification. Skull: Normal. Negative for fracture or focal lesion. Sinuses/Orbits: Postsurgical changes in the anterior wall of the left maxillary antrum and lateral wall left orbit are again seen and stable. Other: None CT CERVICAL SPINE FINDINGS Alignment: Within normal limits. Skull base and vertebrae: 7 cervical segments are well visualized. Vertebral body height is well maintained. Disc space narrowing is noted from C3-C7 with mild osteophytic changes. Mild facet hypertrophic changes are noted. No acute fracture or acute facet abnormality is noted. Mild neural foraminal narrowing is noted bilaterally. Soft tissues and spinal canal: Surrounding soft tissues are within normal limits. Upper chest: Visualized lung apices are unremarkable. Other: None IMPRESSION: CT of the head: Chronic atrophic and ischemic changes without acute intracranial abnormality. CT of the cervical spine: Multilevel degenerative change without acute abnormality. Electronically Signed   By: [place] Catalina M.D.   On: 01/10/2019 01:17   Dg Chest Portable 1 View  Result Date: 01/09/2019 CLINICAL DATA:  Altered mental status and recent falls EXAM: PORTABLE CHEST 1 VIEW COMPARISON:  07/17/2018  FINDINGS: Cardiac shadow is within normal limits. Aortic calcifications are again noted. The lungs are well aerated bilaterally without focal infiltrate or sizable effusion. No bony abnormality is seen. Old rib fractures are noted on the left. IMPRESSION: No acute abnormality noted. Electronically Signed   By: [place] Catalina M.D.   On: 01/09/2019 23:33    Pending Labs Unresulted Labs (From admission, onward)    Start     Ordered   01/10/19 0004  Blood culture (routine x 2)  BLOOD CULTURE X 2,   STAT     01/10/19 0003   01/09/19 2258  SARS Coronavirus 2 [name] order, Performed in [place] hospital lab)  (Novel Coronavirus, NAA ([place]) with precautions panel)  Once,  R     01/09/19 2257   01/09/19 2220  Levetiracetam level  Once,   R     01/09/19 2219          Vitals/Pain Today's Vitals   01/10/19 0200 01/10/19 0206 01/10/19 0215 01/10/19 0230  BP: 124/63 124/63 (!) 132/53 (!) 142/60  Pulse: (!) 57 88 92 (!) 59  Resp: 19  18 17   Temp:      TempSrc:      SpO2: 97%  95% 98%  Weight:      Height:      PainSc:        Isolation Precautions Droplet and Contact precautions  Medications Medications  LORazepam (ATIVAN) injection 1 mg (1 mg Intramuscular Given 01/09/19 2322)  sodium chloride 0.9 % bolus 1,000 mL (1,000 mLs Intravenous New Bag/Given 01/10/19 0039)  acetaminophen (TYLENOL) suppository 650 mg (650 mg Rectal Given 01/10/19 0007)  vancomycin (VANCOCIN) IVPB 1000 mg/200 mL premix (1,000 mg Intravenous New Bag/Given 01/10/19 0115)  piperacillin-tazobactam (ZOSYN) IVPB 3.375 g (0 g Intravenous Stopped 01/10/19 0145)  sodium chloride 0.9 % 1,000 mL with thiamine 832 mg, folic acid 1 mg, multivitamins adult 10 mL infusion ( Intravenous New Bag/Given 01/10/19 0205)    Mobility walks with person assist High fall risk   Focused Assessments Neuro Assessment Handoff:  Swallow screen pass? N/A         Neuro Assessment: Exceptions to WDL Neuro Checks:      Last  Documented NIHSS Modified Score:   Has TPA been given? No If patient is a Neuro Trauma and patient is going to OR before floor call report to [facility] nurse: [phone] or [phone]     R Recommendations: See Admitting Provider Note  Report given to:   Additional Notes: N/A

## 2019-01-10 NOTE — ED Notes (Signed)
Pt transported to MRI 

## 2019-01-10 NOTE — ED Notes (Signed)
ABG collected by respiratory

## 2019-01-10 NOTE — Progress Notes (Signed)
Pt with some confusion. He said the names: Levada Dy and Nicole Kindred but will not say who these people are. CM called a Nicole Kindred on his contacts with the phone going straight to voicemail and unable to leave a message. CM then called a niece that was listed and voicemail left. No return call. CM following.

## 2019-01-10 NOTE — Progress Notes (Signed)
EEG Complete  Results Pending 

## 2019-01-10 NOTE — ED Notes (Signed)
Lab to collect second set of blood cultures.

## 2019-01-10 NOTE — Progress Notes (Signed)
Carotid duplex       has been completed. Preliminary results can be found under CV proc through chart review. Blake [name], BS, RDMS, RVT   

## 2019-01-10 NOTE — Progress Notes (Signed)
PT Cancellation Note  Patient Details Name: KENRIC GINGER MRN: [id number] DOB: 08-04-1941   Cancelled Treatment:    Reason Eval/Treat Not Completed: Patient at procedure or test/unavailable. Pt off of the floor for CT. PT will continue to f/u acutely as available.    Clearnce Sorrel Hser [place]anger 01/10/2019, 2:33 PM

## 2019-01-10 NOTE — Progress Notes (Signed)
Echocardiogram 2D Echocardiogram has been performed.  Blake [name] 01/10/2019, 1:53 PM

## 2019-01-10 NOTE — Consult Note (Addendum)
Referring Physician: Dr. Hal Hope    Chief Complaint: Acute stroke  HPI: Blake [name] is an 78 y.o. male with a prior history of stroke as well as seizure disorder who presented to the ED via EMS after roommate noted 2 falls, 1 last night and 1 on Tuesday evening. He did not strike his head, per report. LKN was 1900 on 4/13. Also had been confused over the past day. No reported seizure activity. EMS reported initial BP at 120s/60s, then elevated at 947S systolic upon arrival. The patient apparently had verbalized "I am having a stroke" and EMS found him on the floor against a wall.    MRI in the ED reveals acute/subacute right caudate head ischemic infarction.   His PMHx includes smoking, COPD, EtOH abuse, GERD, HLD, HTN and stroke.   Home medications include ASA, Keppra and thiamine.   LSN: 1900 on 4/13 tPA Given: No: Out of time window  Past Medical History:  Diagnosis Date  . Chronic insomnia   . Cigarette smoker   . COPD (chronic obstructive pulmonary disease) ([place])   . Diverticulosis of colon   . Elevated prostate specific antigen (PSA)   . ETOH abuse   . GERD (gastroesophageal reflux disease)   . Hx of colonic polyps   . Hyperlipidemia   . Hypertension   . Pulmonary nodule   . Shortness of breath   . Stroke [facility])     Past Surgical History:  Procedure Laterality Date  . COLONOSCOPY    . ESOPHAGOGASTRODUODENOSCOPY (EGD) WITH PROPOFOL N/A 07/21/2018   Procedure: ESOPHAGOGASTRODUODENOSCOPY (EGD) WITH PROPOFOL;  Surgeon: Lavena Bullion, DO;  Location: [place];  Service: Gastroenterology;  Laterality: N/A;  . needle biopsy RUL nodule  01/2009   benign  . TONSILLECTOMY  1947  . VASECTOMY      Family History  Problem Relation Age of Onset  . Diabetes Father   . Lung cancer Brother   . Colon cancer Neg Hx   . Esophageal cancer Neg Hx   . Stomach cancer Neg Hx    Social History:  reports that he has been smoking cigarettes. He has a 55.00 pack-year smoking  history. He has never used smokeless tobacco. He reports current alcohol use of about 24.0 standard drinks of alcohol per week. He reports that he does not use drugs.  Allergies: No Known Allergies  Home Medications:   ROS: The patient is unable to provide a ROS due to AMS.    Physical Examination: Blood pressure (!) 105/43, pulse (!) 57, temperature 99.5 F (37.5 C), temperature source Rectal, resp. rate 14, height 5\' 10"  (1.778 m), weight 68 kg, SpO2 98 %.  HEENT: Normocephalic/atraumatic Lungs: Respirations unlabored Ext: No cyanosis or pallor.   Neurologic Examination: Ment: Initially asleep and difficult to arouse, he awakens to repeated noxious stimuli to a confused state. Minimal verbal output, with perseverative responses to questions and commands. Does not answer any orientation questions. No gross dysarthria. Does not name objects but does repeat "5" several times after examiner asks patient if he can see his hand.  CN: Right pupil irregular, reactive from 7 mm to 5 mm. Left pupil reactive at 4 to 3 mm. Decreased visual acuity in right eye. EOMI without nystagmus. No ptosis. Face symmetric. Reacts to touch. Hearing intact to some questions. Phonation intact. Does not protrude tongue to command.  Motor: Increased flexor tone in upper extremities with equivocal asymmetry. Will raise arms antigravity without drift to command. Withdraws BLE with 2-3/5  strength to noxious plantar stimulation.  Sensory: As above to noxious.  Reflexes: Low-amplitude 3+ bilateral brachioradialis and patellae. Toes mute.  Cerebellar: Does not follow commands for testing. Gait: Unsteady. Able to bear own weight. No clear asymmetry.   Results for orders placed or performed during the hospital encounter of 01/09/19 (from the past 48 hour(s))  Comprehensive metabolic panel     Status: Abnormal   Collection Time: 01/09/19 10:18 PM  Result Value Ref Range   Sodium 136 135 - 145 mmol/L   Potassium 4.7 3.5 -  5.1 mmol/L   Chloride 104 98 - 111 mmol/L   CO2 18 (L) 22 - 32 mmol/L   Glucose, Bld 92 70 - 99 mg/dL   BUN 15 8 - 23 mg/dL   Creatinine, Ser 1.16 0.61 - 1.24 mg/dL   Calcium 9.6 8.9 - 10.3 mg/dL   Total Protein 6.7 6.5 - 8.1 g/dL   Albumin 4.2 3.5 - 5.0 g/dL   AST 44 (H) 15 - 41 U/L   ALT 14 0 - 44 U/L   Alkaline Phosphatase 90 38 - 126 U/L   Total Bilirubin 1.9 (H) 0.3 - 1.2 mg/dL   GFR calc non Af Amer >60 >60 mL/min   GFR calc Af Amer >60 >60 mL/min   Anion gap 14 5 - 15    Comment: Performed at [facility], [address], [place], [place] [address]  CBC WITH DIFFERENTIAL     Status: Abnormal   Collection Time: 01/09/19 10:18 PM  Result Value Ref Range   WBC 16.8 (H) 4.0 - 10.5 K/uL   RBC 4.85 4.22 - 5.81 MIL/uL   Hemoglobin 15.4 13.0 - 17.0 g/dL   HCT 46.7 39.0 - 52.0 %   MCV 96.3 80.0 - 100.0 fL   MCH 31.8 26.0 - 34.0 pg   MCHC 33.0 30.0 - 36.0 g/dL   RDW 13.2 11.5 - 15.5 %   Platelets 237 150 - 400 K/uL   nRBC 0.0 0.0 - 0.2 %   Neutrophils Relative % 81 %   Neutro Abs 13.7 (H) 1.7 - 7.7 K/uL   Lymphocytes Relative 10 %   Lymphs Abs 1.6 0.7 - 4.0 K/uL   Monocytes Relative 8 %   Monocytes Absolute 1.3 (H) 0.1 - 1.0 K/uL   Eosinophils Relative 0 %   Eosinophils Absolute 0.1 0.0 - 0.5 K/uL   Basophils Relative 0 %   Basophils Absolute 0.0 0.0 - 0.1 K/uL   Immature Granulocytes 1 %   Abs Immature Granulocytes 0.14 (H) 0.00 - 0.07 K/uL    Comment: Performed at [facility] [address] [place], [place] [address]  Ethanol     Status: None   Collection Time: 01/09/19 10:18 PM  Result Value Ref Range   Alcohol, Ethyl (B) <10 <10 mg/dL    Comment: (NOTE) Lowest detectable limit for serum alcohol is 10 mg/dL. For medical purposes only. Performed at [facility], [place] [address] [place], [place] [address]   CBG monitoring, ED     Status: None   Collection Time: 01/09/19 10:57 PM  Result Value Ref Range   Glucose-Capillary 85 70 - 99 mg/dL   Lactic acid, plasma     Status: Abnormal   Collection Time: 01/09/19 10:58 PM  Result Value Ref Range   Lactic Acid, Venous 2.7 (HH) 0.5 - 1.9 mmol/L    Comment: CRITICAL RESULT CALLED TO, READ BACK BY AND VERIFIED WITH: GRINDSTAFF S,.RN  047/14/20 2350 WAYK Performed at [facility] [address], [place], [place] [address]   Protime-INR     Status: None   Collection Time: 01/09/19 11:14 PM  Result Value Ref Range   Prothrombin Time 13.2 11.4 - 15.2 seconds   INR 1.0 0.8 - 1.2    Comment: (NOTE) INR goal varies based on device and disease states. Performed at [facility], [place] [address], [place], [place] [address]   I-STAT 7, (LYTES, BLD GAS, ICA, H+H)     Status: Abnormal   Collection Time: 01/10/19 12:22 AM  Result Value Ref Range   pH, Arterial 7.405 7.350 - 7.450   pCO2 arterial 29.4 (L) 32.0 - 48.0 mmHg   pO2, Arterial 85.0 83.0 - 108.0 mmHg   Bicarbonate 18.3 (L) 20.0 - 28.0 mmol/L   TCO2 19 (L) 22 - 32 mmol/L   O2 Saturation 96.0 %   Acid-base deficit 5.0 (H) 0.0 - 2.0 mmol/L   Sodium 135 135 - 145 mmol/L   Potassium 4.1 3.5 - 5.1 mmol/L   Calcium, Ion 1.26 1.15 - 1.40 mmol/L   HCT 43.0 39.0 - 52.0 %   Hemoglobin 14.6 13.0 - 17.0 g/dL   Patient temperature 100.4 F    Collection site RADIAL, ALLEN'S TEST ACCEPTABLE    Drawn by RT    Sample type ARTERIAL   Urinalysis, Complete w Microscopic     Status: Abnormal   Collection Time: 01/10/19 12:24 AM  Result Value Ref Range   Color, Urine YELLOW YELLOW   APPearance CLEAR CLEAR   Specific Gravity, Urine 1.025 1.005 - 1.030   pH 5.5 5.0 - 8.0   Glucose, UA NEGATIVE NEGATIVE mg/dL   Hgb urine dipstick SMALL (A) NEGATIVE   Bilirubin Urine MODERATE (A) NEGATIVE   Ketones, ur 40 (A) NEGATIVE mg/dL   Protein, ur NEGATIVE NEGATIVE mg/dL   Nitrite NEGATIVE NEGATIVE   Leukocytes,Ua NEGATIVE NEGATIVE   Squamous Epithelial / LPF 0-5 0 - 5   WBC, UA 0-5 0 - 5 WBC/hpf   RBC / HPF 11-20 0 - 5 RBC/hpf    Bacteria, UA NONE SEEN NONE SEEN    Comment: Performed at [facility], [place] [address] [place], [place] [address]  Rapid urine drug screen (hospital performed)     Status: None   Collection Time: 01/10/19 12:24 AM  Result Value Ref Range   Opiates NONE DETECTED NONE DETECTED   Cocaine NONE DETECTED NONE DETECTED   Benzodiazepines NONE DETECTED NONE DETECTED   Amphetamines NONE DETECTED NONE DETECTED   Tetrahydrocannabinol NONE DETECTED NONE DETECTED   Barbiturates NONE DETECTED NONE DETECTED    Comment: (NOTE) DRUG SCREEN FOR MEDICAL PURPOSES ONLY.  IF CONFIRMATION IS NEEDED FOR ANY PURPOSE, NOTIFY LAB WITHIN 5 DAYS. LOWEST DETECTABLE LIMITS FOR URINE DRUG SCREEN Drug Class                     Cutoff (ng/mL) Amphetamine and metabolites    1000 Barbiturate and metabolites    200 Benzodiazepine                 914 Tricyclics and metabolites     300 Opiates and metabolites        300 Cocaine and metabolites        300 THC  50 Performed at [facility], [place] [address] [place], [place] [address]   Ammonia     Status: None   Collection Time: 01/10/19 12:55 AM  Result Value Ref Range   Ammonia 19 9 - 35 umol/L    Comment: Performed at [facility], [place] [address] [place], [place] [address]  Lactic acid, plasma     Status: None   Collection Time: 01/10/19  1:11 AM  Result Value Ref Range   Lactic Acid, Venous 1.7 0.5 - 1.9 mmol/L    Comment: Performed at [facility] [address] [place], [place] [address]  SARS Coronavirus 2 [facility] order, Performed in [facility] hospital lab)     Status: None   Collection Time: 01/10/19  1:47 AM  Result Value Ref Range   SARS Coronavirus 2 NEGATIVE NEGATIVE    Comment: (NOTE) If result is NEGATIVE SARS-CoV-2 target nucleic acids are NOT DETECTED. The SARS-CoV-2 RNA is generally detectable in upper and lower  respiratory specimens during the acute phase of infection. The lowest   concentration of SARS-CoV-2 viral copies this assay can detect is 250  copies / mL. A negative result does not preclude SARS-CoV-2 infection  and should not be used as the sole basis for treatment or other  patient management decisions.  A negative result may occur with  improper specimen collection / handling, submission of specimen other  than nasopharyngeal swab, presence of viral mutation(s) within the  areas targeted by this assay, and inadequate number of viral copies  (<250 copies / mL). A negative result must be combined with clinical  observations, patient history, and epidemiological information. If result is POSITIVE SARS-CoV-2 target nucleic acids are DETECTED. The SARS-CoV-2 RNA is generally detectable in upper and lower  respiratory specimens dur ing the acute phase of infection.  Positive  results are indicative of active infection with SARS-CoV-2.  Clinical  correlation with patient history and other diagnostic information is  necessary to determine patient infection status.  Positive results do  not rule out bacterial infection or co-infection with other viruses. If result is PRESUMPTIVE POSTIVE SARS-CoV-2 nucleic acids MAY BE PRESENT.   A presumptive positive result was obtained on the submitted specimen  and confirmed on repeat testing.  While 2019 novel coronavirus  (SARS-CoV-2) nucleic acids may be present in the submitted sample  additional confirmatory testing may be necessary for epidemiological  and / or clinical management purposes  to differentiate between  SARS-CoV-2 and other Sarbecovirus currently known to infect humans.  If clinically indicated additional testing with an alternate test  methodology [phone]) is advised. The SARS-CoV-2 RNA is generally  detectable in upper and lower respiratory sp ecimens during the acute  phase of infection. The expected result is Negative. Fact Sheet for Patients:  StrictlyIdeas.no Fact Sheet  for Healthcare Providers: BankingDealers.co.za This test is not yet approved or cleared by the [place] FDA and has been authorized for detection and/or diagnosis of SARS-CoV-2 by FDA under an Emergency Use Authorization (EUA).  This EUA will remain in effect (meaning this test can be used) for the duration of the COVID-19 declaration under Section 564(b)(1) of the Act, 21 U.S.C. section 360bbb-3(b)(1), unless the authorization is terminated or revoked sooner. Performed at [facility], [place] [address], [place],  [address]   CBC with Differential/Platelet     Status: Abnormal   Collection Time: 01/10/19  4:33 AM  Result Value Ref Range   WBC  13.9 (H) 4.0 - 10.5 K/uL   RBC 4.13 (L) 4.22 - 5.81 MIL/uL   Hemoglobin 13.7 13.0 - 17.0 g/dL   HCT 39.8 39.0 - 52.0 %   MCV 96.4 80.0 - 100.0 fL   MCH 33.2 26.0 - 34.0 pg   MCHC 34.4 30.0 - 36.0 g/dL   RDW 13.4 11.5 - 15.5 %   Platelets 197 150 - 400 K/uL   nRBC 0.0 0.0 - 0.2 %   Neutrophils Relative % 77 %   Neutro Abs 10.6 (H) 1.7 - 7.7 K/uL   Lymphocytes Relative 12 %   Lymphs Abs 1.7 0.7 - 4.0 K/uL   Monocytes Relative 10 %   Monocytes Absolute 1.4 (H) 0.1 - 1.0 K/uL   Eosinophils Relative 1 %   Eosinophils Absolute 0.1 0.0 - 0.5 K/uL   Basophils Relative 0 %   Basophils Absolute 0.0 0.0 - 0.1 K/uL   Immature Granulocytes 0 %   Abs Immature Granulocytes 0.06 0.00 - 0.07 K/uL    Comment: Performed at [facility] [address], [place], [place] [address]   Ct Head Wo Contrast  Result Date: 01/10/2019 CLINICAL DATA:  Recent falls EXAM: CT HEAD WITHOUT CONTRAST CT CERVICAL SPINE WITHOUT CONTRAST TECHNIQUE: Multidetector CT imaging of the head and cervical spine was performed following the standard protocol without intravenous contrast. Multiplanar CT image reconstructions of the cervical spine were also generated. COMPARISON:  06/25/2018 FINDINGS: CT HEAD FINDINGS Brain: Chronic atrophic and  ischemic changes are noted. No findings to suggest acute hemorrhage, acute infarction or space-occupying mass lesion are noted. Vascular: No hyperdense vessel or unexpected calcification. Skull: Normal. Negative for fracture or focal lesion. Sinuses/Orbits: Postsurgical changes in the anterior wall of the left maxillary antrum and lateral wall left orbit are again seen and stable. Other: None CT CERVICAL SPINE FINDINGS Alignment: Within normal limits. Skull base and vertebrae: 7 cervical segments are well visualized. Vertebral body height is well maintained. Disc space narrowing is noted from C3-C7 with mild osteophytic changes. Mild facet hypertrophic changes are noted. No acute fracture or acute facet abnormality is noted. Mild neural foraminal narrowing is noted bilaterally. Soft tissues and spinal canal: Surrounding soft tissues are within normal limits. Upper chest: Visualized lung apices are unremarkable. Other: None IMPRESSION: CT of the head: Chronic atrophic and ischemic changes without acute intracranial abnormality. CT of the cervical spine: Multilevel degenerative change without acute abnormality. Electronically Signed   By: Inez Catalina M.D.   On: 01/10/2019 01:17   Ct Cervical Spine Wo Contrast  Result Date: 01/10/2019 CLINICAL DATA:  Recent falls EXAM: CT HEAD WITHOUT CONTRAST CT CERVICAL SPINE WITHOUT CONTRAST TECHNIQUE: Multidetector CT imaging of the head and cervical spine was performed following the standard protocol without intravenous contrast. Multiplanar CT image reconstructions of the cervical spine were also generated. COMPARISON:  06/25/2018 FINDINGS: CT HEAD FINDINGS Brain: Chronic atrophic and ischemic changes are noted. No findings to suggest acute hemorrhage, acute infarction or space-occupying mass lesion are noted. Vascular: No hyperdense vessel or unexpected calcification. Skull: Normal. Negative for fracture or focal lesion. Sinuses/Orbits: Postsurgical changes in the anterior  wall of the left maxillary antrum and lateral wall left orbit are again seen and stable. Other: None CT CERVICAL SPINE FINDINGS Alignment: Within normal limits. Skull base and vertebrae: 7 cervical segments are well visualized. Vertebral body height is well maintained. Disc space narrowing is noted from C3-C7 with mild osteophytic changes. Mild facet hypertrophic changes are noted. No acute fracture  or acute facet abnormality is noted. Mild neural foraminal narrowing is noted bilaterally. Soft tissues and spinal canal: Surrounding soft tissues are within normal limits. Upper chest: Visualized lung apices are unremarkable. Other: None IMPRESSION: CT of the head: Chronic atrophic and ischemic changes without acute intracranial abnormality. CT of the cervical spine: Multilevel degenerative change without acute abnormality. Electronically Signed   By: Inez Catalina M.D.   On: 01/10/2019 01:17   Mr Brain Wo Contrast  Result Date: 01/10/2019 CLINICAL DATA:  78 y/o  M; altered mental status and fall. EXAM: MRI HEAD WITHOUT CONTRAST TECHNIQUE: Multiplanar, multiecho pulse sequences of the brain and surrounding structures were obtained without intravenous contrast. COMPARISON:  01/09/2019 CT head. FINDINGS: Brain: 8 mm focus of reduced diffusion within the right caudate head (series 5, image 55) compatible with acute/early subacute infarction. No associated hemorrhage or mass effect. Motion degradation T2 and T2 FLAIR weighted sequences. Early confluent nonspecific T2 FLAIR hyperintensities in subcortical and periventricular white matter are compatible with moderate chronic microvascular ischemic changes. Moderate volume loss of the brain. No intracranial hemorrhage, extra-axial collection, hydrocephalus or herniation. Vascular: Normal flow voids. Skull and upper cervical spine: Normal marrow signal. Sinuses/Orbits: Negative. Other: None. IMPRESSION: 1. 8 mm acute/early subacute infarction within the right caudate head.  No hemorrhage or mass effect. 2. Moderate chronic microvascular ischemic changes and volume loss of the brain. These results were called by telephone at the time of interpretation on 01/10/2019 at 3:59 am to Dr. Leonides Schanz , who verbally acknowledged these results. Electronically Signed   By: Kristine Garbe M.D.   On: 01/10/2019 04:00   Dg Chest Portable 1 View  Result Date: 01/09/2019 CLINICAL DATA:  Altered mental status and recent falls EXAM: PORTABLE CHEST 1 VIEW COMPARISON:  07/17/2018 FINDINGS: Cardiac shadow is within normal limits. Aortic calcifications are again noted. The lungs are well aerated bilaterally without focal infiltrate or sizable effusion. No bony abnormality is seen. Old rib fractures are noted on the left. IMPRESSION: No acute abnormality noted. Electronically Signed   By: Inez Catalina M.D.   On: 01/09/2019 23:33    Assessment: 78 y.o. male with history of stroke presenting to the ED after 2 falls. MRI reveals acute ischemic infarction. 1. Exam without clear asymmetry. Findings consistent with AMS versus aphasia.  2. MRI brain:  8 mm acute/early subacute infarction within the right caudate head. No hemorrhage or mass effect. Moderate chronic microvascular ischemic changes and volume loss of the brain.  3. Stroke Risk Factors - smoking, HLD, HTN and prior stroke 4. EtOH abuse with level in ED < 10. High risk for withdrawal syndrome, including seizures.  5. The right caudate head stroke appears too small to fully account for his symptoms/signs. DDx also includes acute Wernicke's encephalopathy.   Recommendations: 1. Continue Keppra at 500 mg BID. Administer IV.  2. Agree with IV thiamine supplementation. Switch to high dose protocol: 500 mg IV TID x 3 days, then 250 mg IV TID x 3 days, then 100 mg po qd thereafter. I have ordered the first 3 days of the high-dose protocol.  3. MRA head 4. Carotid ultrasound 5. TTE 6. Cardiac telemetry 7. Permissive HTN x 24 hours 8.  Continue home ASA. Unclear if he has been compliant.  9. HgbA1c, fasting lipid panel 10. PT consult, OT consult, Speech consult 11. Frequent neuro checks 12. CIWA protocol    @Electronically  signed: Dr. Kerney Elbe 01/10/2019, 5:21 AM

## 2019-01-10 NOTE — Procedures (Signed)
History: 78 year old male being evaluated for seizures in the setting of recent infarct.  Sedation: None  Technique: This is a 21 channel routine scalp EEG performed at the bedside with bipolar and monopolar montages arranged in accordance to the international 10/20 system of electrode placement. One channel was dedicated to EKG recording.    Background: The background consists of intermixed alpha and beta activities. There is a well defined posterior dominant rhythm of 10-11 hz that attenuates with eye opening. Sleep is recorded with normal appearing structures.   Photic stimulation: Physiologic driving is performed  EEG Abnormalities: None  Clinical Interpretation: This normal EEG is recorded in the waking and sleep state. There was no seizure or seizure predisposition recorded on this study. Please note that lack of epileptiform activity on EEG does not preclude the possibility of epilepsy.   Roland Rack, MD [facility] [phone]  If 7pm- 7am, please page neurology on call as listed in [place].

## 2019-01-10 NOTE — Evaluation (Signed)
Clinical/Bedside Swallow Evaluation Patient Details  Name: Blake [name] MRN: [id number] Date of Birth: 1941-07-25  Today's Date: 01/10/2019 Time: SLP Start Time (ACUTE ONLY): 1124 SLP Stop Time (ACUTE ONLY): 1140 SLP Time Calculation (min) (ACUTE ONLY): 16 min  Past Medical History:  Past Medical History:  Diagnosis Date  . Chronic insomnia   . Cigarette smoker   . COPD (chronic obstructive pulmonary disease) ([place])   . Diverticulosis of colon   . Elevated prostate specific antigen (PSA)   . ETOH abuse   . GERD (gastroesophageal reflux disease)   . Hx of colonic polyps   . Hyperlipidemia   . Hypertension   . Pulmonary nodule   . Shortness of breath   . Stroke [facility])    Past Surgical History:  Past Surgical History:  Procedure Laterality Date  . COLONOSCOPY    . ESOPHAGOGASTRODUODENOSCOPY (EGD) WITH PROPOFOL N/A 07/21/2018   Procedure: ESOPHAGOGASTRODUODENOSCOPY (EGD) WITH PROPOFOL;  Surgeon: Lavena Bullion, DO;  Location: [place];  Service: Gastroenterology;  Laterality: N/A;  . needle biopsy RUL nodule  01/2009   benign  . TONSILLECTOMY  1947  . VASECTOMY     HPI:  Blake [name] is a 78 y.o. male with with history of stroke seizures, alcohol abuse, COPD was brought to the ER after patient's friend called EMS since patient had 2 falls over the last 24 hours with confusion. CT head was unremarkable. COVID-19 test was negative. MRI 8 mm acute/early subacute infarction within the right caudate head. No hemorrhage or mass effect, moderate chronic microvascular ischemic changes and volume loss of the brain. Barium esophagram 07/19/18 distal esophageal stricture extending for a length of approximately 17 mm, narrowing the esophagus to approximately 6-7 mm. This has the appearance of a benign stricture, significant esophageal dysmotility with tertiary contractions and barium stasis. MBS 07/15/18 primary esophageal dysphagia, backflow from esophagus into pharynx, residue  throughout esophagus. Flash penetration of thin. CXR No acute abnormality noted.    Assessment / Plan / Recommendation Clinical Impression  Significant cough after consumption of 3 oz water indicative of possible inadequate airway protection. Bedside swallow cannot differentiate pt's baseline esophageal dysphagia for appreciable dysmotility and stasis between effects from acute neurological event. Oral reside and prolonged mastication/transit with cracker solid; no observable difficulty with puree. MBS recommended and scheduled for 1430 today- keep npo until then.      SLP Visit Diagnosis: Dysphagia, unspecified (R13.10)    Aspiration Risk  Moderate aspiration risk    Diet Recommendation NPO   Medication Administration: Via alternative means    Other  Recommendations Oral Care Recommendations: Oral care QID   Follow up Recommendations        Frequency and Duration            Prognosis        Swallow Study   General HPI: Blake [name] is a 78 y.o. male with with history of stroke seizures, alcohol abuse, COPD was brought to the ER after patient's friend called EMS since patient had 2 falls over the last 24 hours with confusion. CT head was unremarkable. COVID-19 test was negative. MRI 8 mm acute/early subacute infarction within the right caudate head. No hemorrhage or mass effect, moderate chronic microvascular ischemic changes and volume loss of the brain. Barium esophagram 07/19/18 distal esophageal stricture extending for a length of approximately 17 mm, narrowing the esophagus to approximately 6-7 mm. This has the appearance of a benign stricture, significant esophageal dysmotility with tertiary contractions and  barium stasis. MBS 07/15/18 primary esophageal dysphagia, backflow from esophagus into pharynx, residue throughout esophagus. Flash penetration of thin. CXR No acute abnormality noted.  Type of Study: Bedside Swallow Evaluation Previous Swallow Assessment: (yes, see  HPI) Diet Prior to this Study: NPO Temperature Spikes Noted: No Respiratory Status: Room air History of Recent Intubation: No Behavior/Cognition: Alert;Cooperative;Pleasant mood;Confused;Other (Comment)(HOH) Oral Cavity Assessment: Within Functional Limits Oral Care Completed by SLP: No Oral Cavity - Dentition: Dentures, bottom;Dentures, top Vision: Functional for self-feeding Self-Feeding Abilities: Needs assist;Needs set up Patient Positioning: Upright in bed Baseline Vocal Quality: Normal Volitional Cough: Other (Comment)(unable to hear or imitate SLPsuspect strong volitional cough) Volitional Swallow: Unable to elicit    Oral/Motor/Sensory Function Overall Oral Motor/Sensory Function: Within functional limits   Ice Chips Ice chips: Impaired   Thin Liquid Thin Liquid: Impaired Presentation: Cup;Straw Pharyngeal  Phase Impairments: Cough - Immediate    Nectar Thick Nectar Thick Liquid: Not tested   Honey Thick Honey Thick Liquid: Not tested   Puree Puree: Within functional limits   Solid     Solid: Impaired Oral Phase Functional Implications: Oral residue;Prolonged oral transit      Houston Siren 01/10/2019,11:58 AM   Orbie Pyo Colvin Caroli.Ed Risk analyst [phone] Office [phone]

## 2019-01-10 NOTE — ED Notes (Signed)
Pt currently asleep, environment secured

## 2019-01-10 NOTE — H&P (Signed)
History and Physical    Blake [name] KYH:[id number] DOB: Jul 11, 1941 DOA: 01/09/2019  PCP: Patient, No Pcp Per  Patient coming from: Home.  Chief Complaint: Fall and confusion.  HPI: Blake [name] is a 78 y.o. male with with history of previous stroke seizures alcohol abuse COPD was brought to the ER after patient's friend called EMS since patient had 2 falls over the last 24 hours and has been not himself with confusion.  On arrival at the patient's home patient was found to be confused.  No further history is obtained.  Most of the history I obtained from ER physician as patient is confused.  ED Course: In the ER patient was confused agitated requiring Ativan.  Alcohol levels were negative.  Ammonia was 19.  AST was 44 ALT was 14 creatinine 1.1 WBC count was 16.8 hemoglobin 15.4 platelets 237.  CT head was unremarkable patient was febrile temperature of 100.4.  Chest x-ray unremarkable.  UA showing negative for nitrites bacteria none WBC none.  COVID-19 test was negative.  EKG was showing normal sinus rhythm heart rate 62 bpm.  Blood cultures were sent and empirically started on antibiotics for unknown source of infection.  MRI brain was ordered and it shows acute stroke.  Neurology was consulted and admitted for acute stroke acute encephalopathy.  Review of Systems: As per HPI, rest all negative.   Past Medical History:  Diagnosis Date  . Chronic insomnia   . Cigarette smoker   . COPD (chronic obstructive pulmonary disease) ([place])   . Diverticulosis of colon   . Elevated prostate specific antigen (PSA)   . ETOH abuse   . GERD (gastroesophageal reflux disease)   . Hx of colonic polyps   . Hyperlipidemia   . Hypertension   . Pulmonary nodule   . Shortness of breath   . Stroke [facility])     Past Surgical History:  Procedure Laterality Date  . COLONOSCOPY    . ESOPHAGOGASTRODUODENOSCOPY (EGD) WITH PROPOFOL N/A 07/21/2018   Procedure: ESOPHAGOGASTRODUODENOSCOPY (EGD) WITH PROPOFOL;   Surgeon: Lavena Bullion, DO;  Location: [place];  Service: Gastroenterology;  Laterality: N/A;  . needle biopsy RUL nodule  01/2009   benign  . TONSILLECTOMY  1947  . VASECTOMY       reports that he has been smoking cigarettes. He has a 55.00 pack-year smoking history. He has never used smokeless tobacco. He reports current alcohol use of about 24.0 standard drinks of alcohol per week. He reports that he does not use drugs.  No Known Allergies  Family History  Problem Relation Age of Onset  . Diabetes Father   . Lung cancer Brother   . Colon cancer Neg Hx   . Esophageal cancer Neg Hx   . Stomach cancer Neg Hx     Prior to Admission medications   Medication Sig Start Date End Date Taking? Authorizing Provider  acetaminophen (TYLENOL) 325 MG tablet Take 650 mg by mouth every 6 (six) hours as needed.    [provider]  aspirin 81 MG chewable tablet Chew 81 mg by mouth daily.    [provider]  Cholecalciferol (VITAMIN D-3) 25 MCG (1000 UT) CAPS Take 1 capsule (1,000 Units total) by mouth daily. 08/11/18   Medina-Vargas, Monina C, NP  folic acid (FOLVITE) 1 MG tablet Take 1 tablet (1 mg total) by mouth daily. 08/11/18   Medina-Vargas, Monina C, NP  gemfibrozil (LOPID) 600 MG tablet Take 1 tablet (600 mg total) by  mouth 2 (two) times daily. 08/11/18   Medina-Vargas, Monina C, NP  ipratropium-albuterol (DUONEB) 0.5-2.5 (3) MG/3ML SOLN Take 3 mLs by nebulization See admin instructions. Nebulize and inhale 3 ml's into the lungs at 6 AM, 2 PM, and 10 PM for 5 days with a start date of 07/06/18 AND 3 ml's every six hours as needed for coughing 08/11/18   Medina-Vargas, Monina C, NP  levETIRAcetam (KEPPRA) 500 MG tablet Take 1 tablet (500 mg total) by mouth 2 (two) times daily. 08/11/18   Medina-Vargas, Monina C, NP  magnesium hydroxide (MILK OF MAGNESIA) 400 MG/5ML suspension Take 30 mLs by mouth once as needed for mild constipation.    [provider]   Melatonin 3 MG TABS Take 3 mg by mouth at bedtime.    [provider]  nicotine (NICODERM CQ - DOSED IN MG/24 HOURS) 21 mg/24hr patch Place 1 patch (21 mg total) onto the skin daily. 08/11/18   Medina-Vargas, Monina C, NP  Nutritional Supplements (NUTRITIONAL SUPPLEMENT PLUS) LIQD Take 120 mLs by mouth See admin instructions. MedPass: Drink 120 ml's by mouth two times a day    [provider]  QUEtiapine (SEROQUEL) 50 MG tablet Take 0.5 tablets (25 mg total) by mouth at bedtime. 08/11/18   Medina-Vargas, Monina C, NP  Sodium Phosphates (RA SALINE ENEMA) 19-7 GM/118ML ENEM Place 1 enema rectally once as needed (for constipation not relieved by Dulcolax suppository and call MD if no relief from enema).    [provider]  thiamine 100 MG tablet Take 1 tablet (100 mg total) by mouth daily. 08/11/18   Medina-Vargas, Jaymes Graff C, NP    Physical Exam: Vitals:   01/10/19 0250 01/10/19 0300 01/10/19 0445 01/10/19 0500  BP:  125/60 (!) 120/50 (!) 105/43  Pulse:  77 76 (!) 57  Resp:  16 15 14   Temp: 99.5 F (37.5 C)     TempSrc: Rectal     SpO2:  95% 98% 98%  Weight:      Height:          Constitutional: Moderately built and nourished. Vitals:   01/10/19 0250 01/10/19 0300 01/10/19 0445 01/10/19 0500  BP:  125/60 (!) 120/50 (!) 105/43  Pulse:  77 76 (!) 57  Resp:  16 15 14   Temp: 99.5 F (37.5 C)     TempSrc: Rectal     SpO2:  95% 98% 98%  Weight:      Height:       Eyes: Anicteric no pallor. ENMT: No discharge from the ears eyes nose and mouth. Neck: No mass felt.  No neck rigidity. Respiratory: No rhonchi or crepitations. Cardiovascular: S1-S2 heard. Abdomen: Soft nontender bowel sounds present. Musculoskeletal: No edema. Skin: Chronic skin changes. Neurologic: Patient is drowsy arousable oriented to his name moves all extremities but confused.  Pupils are reacting to light. Psychiatric: Patient is confused.  Oriented to his name.   Labs on Admission:  I have personally reviewed following labs and imaging studies  CBC: Recent Labs  Lab 01/09/19 2218 01/10/19 0022 01/10/19 0433  WBC 16.8*  --  13.9*  NEUTROABS 13.7*  --  10.6*  HGB 15.4 14.6 13.7  HCT 46.7 43.0 39.8  MCV 96.3  --  96.4  PLT 237  --  616   Basic Metabolic Panel: Recent Labs  Lab 01/09/19 2218 01/10/19 0022  NA 136 135  K 4.7 4.1  CL 104  --   CO2 18*  --   GLUCOSE 92  --  BUN 15  --   CREATININE 1.16  --   CALCIUM 9.6  --    GFR: Estimated Creatinine Clearance: 51.3 mL/min (by C-G formula based on SCr of 1.16 mg/dL). Liver Function Tests: Recent Labs  Lab 01/09/19 2218  AST 44*  ALT 14  ALKPHOS 90  BILITOT 1.9*  PROT 6.7  ALBUMIN 4.2   No results for input(s): LIPASE, AMYLASE in the last 168 hours. Recent Labs  Lab 01/10/19 0055  AMMONIA 19   Coagulation Profile: Recent Labs  Lab 01/09/19 2314  INR 1.0   Cardiac Enzymes: No results for input(s): CKTOTAL, CKMB, CKMBINDEX, TROPONINI in the last 168 hours. BNP (last 3 results) No results for input(s): PROBNP in the last 8760 hours. HbA1C: No results for input(s): HGBA1C in the last 72 hours. CBG: Recent Labs  Lab 01/09/19 2257  GLUCAP 85   Lipid Profile: No results for input(s): CHOL, HDL, LDLCALC, TRIG, CHOLHDL, LDLDIRECT in the last 72 hours. Thyroid Function Tests: No results for input(s): TSH, T4TOTAL, FREET4, T3FREE, THYROIDAB in the last 72 hours. Anemia Panel: No results for input(s): VITAMINB12, FOLATE, FERRITIN, TIBC, IRON, RETICCTPCT in the last 72 hours. Urine analysis:    Component Value Date/Time   COLORURINE YELLOW 01/10/2019 0024   APPEARANCEUR CLEAR 01/10/2019 0024   LABSPEC 1.025 01/10/2019 0024   PHURINE 5.5 01/10/2019 0024   GLUCOSEU NEGATIVE 01/10/2019 0024   GLUCOSEU NEGATIVE 02/02/2011 1543   HGBUR SMALL (A) 01/10/2019 0024   BILIRUBINUR MODERATE (A) 01/10/2019 0024   KETONESUR 40 (A) 01/10/2019 0024   PROTEINUR NEGATIVE 01/10/2019 0024    UROBILINOGEN 1.0 10/14/2014 1211   NITRITE NEGATIVE 01/10/2019 0024   LEUKOCYTESUR NEGATIVE 01/10/2019 0024   Sepsis Labs: @LABRCNTIP (procalcitonin:4,lacticidven:4) ) Recent Results (from the past 240 hour(s))  SARS Coronavirus 2 [facility] order, Performed in [place] hospital lab)     Status: None   Collection Time: 01/10/19  1:47 AM  Result Value Ref Range Status   SARS Coronavirus 2 NEGATIVE NEGATIVE Final    Comment: (NOTE) If result is NEGATIVE SARS-CoV-2 target nucleic acids are NOT DETECTED. The SARS-CoV-2 RNA is generally detectable in upper and lower  respiratory specimens during the acute phase of infection. The lowest  concentration of SARS-CoV-2 viral copies this assay can detect is 250  copies / mL. A negative result does not preclude SARS-CoV-2 infection  and should not be used as the sole basis for treatment or other  patient management decisions.  A negative result may occur with  improper specimen collection / handling, submission of specimen other  than nasopharyngeal swab, presence of viral mutation(s) within the  areas targeted by this assay, and inadequate number of viral copies  (<250 copies / mL). A negative result must be combined with clinical  observations, patient history, and epidemiological information. If result is POSITIVE SARS-CoV-2 target nucleic acids are DETECTED. The SARS-CoV-2 RNA is generally detectable in upper and lower  respiratory specimens dur ing the acute phase of infection.  Positive  results are indicative of active infection with SARS-CoV-2.  Clinical  correlation with patient history and other diagnostic information is  necessary to determine patient infection status.  Positive results do  not rule out bacterial infection or co-infection with other viruses. If result is PRESUMPTIVE POSTIVE SARS-CoV-2 nucleic acids MAY BE PRESENT.   A presumptive positive result was obtained on the submitted specimen  and confirmed on repeat  testing.  While 2019 novel coronavirus  (SARS-CoV-2) nucleic acids may be present in  the submitted sample  additional confirmatory testing may be necessary for epidemiological  and / or clinical management purposes  to differentiate between  SARS-CoV-2 and other Sarbecovirus currently known to infect humans.  If clinically indicated additional testing with an alternate test  methodology [id number]) is advised. The SARS-CoV-2 RNA is generally  detectable in upper and lower respiratory sp ecimens during the acute  phase of infection. The expected result is Negative. Fact Sheet for Patients:  StrictlyIdeas.no Fact Sheet for Healthcare Providers: BankingDealers.co.za This test is not yet approved or cleared by the [place] FDA and has been authorized for detection and/or diagnosis of SARS-CoV-2 by FDA under an Emergency Use Authorization (EUA).  This EUA will remain in effect (meaning this test can be used) for the duration of the COVID-19 declaration under Section 564(b)(1) of the Act, 21 U.S.C. section 360bbb-3(b)(1), unless the authorization is terminated or revoked sooner. Performed at [facility], [place] [address], [place], [place] [address]      Radiological Exams on Admission: Ct Head Wo Contrast  Result Date: 01/10/2019 CLINICAL DATA:  Recent falls EXAM: CT HEAD WITHOUT CONTRAST CT CERVICAL SPINE WITHOUT CONTRAST TECHNIQUE: Multidetector CT imaging of the head and cervical spine was performed following the standard protocol without intravenous contrast. Multiplanar CT image reconstructions of the cervical spine were also generated. COMPARISON:  06/25/2018 FINDINGS: CT HEAD FINDINGS Brain: Chronic atrophic and ischemic changes are noted. No findings to suggest acute hemorrhage, acute infarction or space-occupying mass lesion are noted. Vascular: No hyperdense vessel or unexpected calcification. Skull: Normal. Negative for fracture  or focal lesion. Sinuses/Orbits: Postsurgical changes in the anterior wall of the left maxillary antrum and lateral wall left orbit are again seen and stable. Other: None CT CERVICAL SPINE FINDINGS Alignment: Within normal limits. Skull base and vertebrae: 7 cervical segments are well visualized. Vertebral body height is well maintained. Disc space narrowing is noted from C3-C7 with mild osteophytic changes. Mild facet hypertrophic changes are noted. No acute fracture or acute facet abnormality is noted. Mild neural foraminal narrowing is noted bilaterally. Soft tissues and spinal canal: Surrounding soft tissues are within normal limits. Upper chest: Visualized lung apices are unremarkable. Other: None IMPRESSION: CT of the head: Chronic atrophic and ischemic changes without acute intracranial abnormality. CT of the cervical spine: Multilevel degenerative change without acute abnormality. Electronically Signed   By: Inez Catalina M.D.   On: 01/10/2019 01:17   Ct Cervical Spine Wo Contrast  Result Date: 01/10/2019 CLINICAL DATA:  Recent falls EXAM: CT HEAD WITHOUT CONTRAST CT CERVICAL SPINE WITHOUT CONTRAST TECHNIQUE: Multidetector CT imaging of the head and cervical spine was performed following the standard protocol without intravenous contrast. Multiplanar CT image reconstructions of the cervical spine were also generated. COMPARISON:  06/25/2018 FINDINGS: CT HEAD FINDINGS Brain: Chronic atrophic and ischemic changes are noted. No findings to suggest acute hemorrhage, acute infarction or space-occupying mass lesion are noted. Vascular: No hyperdense vessel or unexpected calcification. Skull: Normal. Negative for fracture or focal lesion. Sinuses/Orbits: Postsurgical changes in the anterior wall of the left maxillary antrum and lateral wall left orbit are again seen and stable. Other: None CT CERVICAL SPINE FINDINGS Alignment: Within normal limits. Skull base and vertebrae: 7 cervical segments are well  visualized. Vertebral body height is well maintained. Disc space narrowing is noted from C3-C7 with mild osteophytic changes. Mild facet hypertrophic changes are noted. No acute fracture or acute facet abnormality is noted. Mild neural foraminal narrowing is noted bilaterally. Soft tissues  and spinal canal: Surrounding soft tissues are within normal limits. Upper chest: Visualized lung apices are unremarkable. Other: None IMPRESSION: CT of the head: Chronic atrophic and ischemic changes without acute intracranial abnormality. CT of the cervical spine: Multilevel degenerative change without acute abnormality. Electronically Signed   By: Inez Catalina M.D.   On: 01/10/2019 01:17   Mr Brain Wo Contrast  Result Date: 01/10/2019 CLINICAL DATA:  78 y/o  M; altered mental status and fall. EXAM: MRI HEAD WITHOUT CONTRAST TECHNIQUE: Multiplanar, multiecho pulse sequences of the brain and surrounding structures were obtained without intravenous contrast. COMPARISON:  01/09/2019 CT head. FINDINGS: Brain: 8 mm focus of reduced diffusion within the right caudate head (series 5, image 55) compatible with acute/early subacute infarction. No associated hemorrhage or mass effect. Motion degradation T2 and T2 FLAIR weighted sequences. Early confluent nonspecific T2 FLAIR hyperintensities in subcortical and periventricular white matter are compatible with moderate chronic microvascular ischemic changes. Moderate volume loss of the brain. No intracranial hemorrhage, extra-axial collection, hydrocephalus or herniation. Vascular: Normal flow voids. Skull and upper cervical spine: Normal marrow signal. Sinuses/Orbits: Negative. Other: None. IMPRESSION: 1. 8 mm acute/early subacute infarction within the right caudate head. No hemorrhage or mass effect. 2. Moderate chronic microvascular ischemic changes and volume loss of the brain. These results were called by telephone at the time of interpretation on 01/10/2019 at 3:59 am to Dr. Leonides Schanz ,  who verbally acknowledged these results. Electronically Signed   By: Kristine Garbe M.D.   On: 01/10/2019 04:00   Dg Chest Portable 1 View  Result Date: 01/09/2019 CLINICAL DATA:  Altered mental status and recent falls EXAM: PORTABLE CHEST 1 VIEW COMPARISON:  07/17/2018 FINDINGS: Cardiac shadow is within normal limits. Aortic calcifications are again noted. The lungs are well aerated bilaterally without focal infiltrate or sizable effusion. No bony abnormality is seen. Old rib fractures are noted on the left. IMPRESSION: No acute abnormality noted. Electronically Signed   By: Inez Catalina M.D.   On: 01/09/2019 23:33    EKG: Independently reviewed.  Normal sinus rhythm.  Assessment/Plan Principal Problem:   Acute CVA (cerebrovascular accident) ([place]) Active Problems:   ETOH abuse   Acute encephalopathy   Hypertension   Seizure disorder ([place])    1. Acute CVA -consulted neurology on-call.  Since patient is confused I have kept patient n.p.o. and consulted speech therapist for swallow.  Check MRA brain 2D echo carotid Doppler physical therapy consult on aspirin.  Statins and patient can take orally. 2. Acute encephalopathy -could be multifactorial.  Among the differentials alcohol withdrawal with possible seizure postictal state and also Wernicke's encephalopathy secondary to thiamine deficiency.  Thiamine levels have been sent EEG has been ordered Keppra has been dosed IV.  No definite signs of encephalitis or meningitis but empirically on antibiotics as patient was febrile.  Follow cultures.  May need lumbar puncture if symptoms do not improve.  Neurology on case. 3. EtOH abuse on alcohol withdrawal protocol. 4. Seizure disorder on Keppra IV until patient can swallow. 5. COPD presently not wheezing.  In addition I have ordered troponin repeat labs including metabolic panel CBC CK levels which all are pending.   DVT prophylaxis: SCDs in anticipation of possible procedure. Code  Status: Full code. Family Communication: No family at the bedside. Disposition Plan: To be determined. Consults called: Neurology. Admission status: Inpatient.   Rise Patience MD [facility] Pager [phone].  If 7PM-7AM, please contact night-coverage [email or web address] Password [facility]  01/10/2019, 5:49 AM

## 2019-01-10 NOTE — Progress Notes (Signed)
Pharmacy Antibiotic Note  Blake [name] is a 78 y.o. male admitted on 01/09/2019 with acute ischemic infarction and sepsis.  Pharmacy has been consulted for vancomycin and Zosyn dosing.  Plan: Zosyn 3.375g IV q8h (4 hour infusion).  Vancomycin 1,000 mg IV x 1 (given in the ED) followed by: Vancomycin 1,750 mg IV Q 36 hrs. Goal AUC 400-550. Expected AUC: 507.3 SCr used: 1.16  Monitor cultures, LOT, and clinical status  Height: 5\' 10"  (177.8 cm) Weight: 150 lb (68 kg) IBW/kg (Calculated) : 73  Temp (24hrs), Avg:99.7 F (37.6 C), Min:99.1 F (37.3 C), Max:100.4 F (38 C)  Recent Labs  Lab 01/09/19 2218 01/09/19 2258 01/10/19 0111 01/10/19 0433  WBC 16.8*  --   --  13.9*  CREATININE 1.16  --   --   --   LATICACIDVEN  --  2.7* 1.7  --     Estimated Creatinine Clearance: 51.3 mL/min (by C-G formula based on SCr of 1.16 mg/dL).    No Known Allergies  Antimicrobials this admission: Vancomycin 4/15 >> Zosyn 4/15 >>  Dose adjustments this admission: None  Microbiology results: 4/15 BCx: sent 4/15 COVID-19: negative  Thank you for allowing pharmacy to be a part of this patient's care.  Vertis Kelch, PharmD PGY1 Pharmacy Resident Phone [phone] 01/10/2019       6:05 AM

## 2019-01-10 NOTE — Progress Notes (Signed)
Modified Barium Swallow Progress Note  Patient Details  Name: Blake [name] MRN: [id number] Date of Birth: 1941/03/10  Today's Date: 01/10/2019  Modified Barium Swallow completed.  Full report located under Chart Review in the Imaging Section.  Brief recommendations include the following:  Clinical Impression  Pt demonstrates min-mild oropharyngeal dysphagia (oral > pharyngeal) and evidence of previously diagnosed esophageal dysphagia. Difficulty gathering and containing bolus in a cupped manner resulting in sublingual spill with thin and nectar. Mild lingual residue, sensed with subswallow to clear. Overall, pharyngeal phase was within functional limits. There was minimal residue in vallecular that did not increase as study progressed. Laryngeal elevation and epiglottic deflection was functional to prevent any penetration or aspiration during studys. Video image of esophagus showed slow transit and periods of widened then very narrow places along esophagus. Recommend Dys 2, thin liquids, straws allowed, remain upright 45 min after meals, small sips and pills whole in applesauce. ST will continue to follow.      Swallow Evaluation Recommendations  Dys 2 texture, thin liquids, pills whole in appleauce  2x week x 2 weeks     Houston Siren M.Ed Actor Pager [phone] Office [phone]                                          Houston Siren 01/10/2019,4:04 PM

## 2019-01-10 NOTE — Progress Notes (Signed)
Patient seen and evaluated, chart reviewed, please see EMR for updated orders. Please see full H&P dictated by admitting physician Dr. Hal Hope for same date of service.    Brain MRI IMPRESSION: 1) 8 mm acute/early subacute infarction within the right caudate head. No hemorrhage or mass effect. 2. Moderate chronic microvascular ischemic changes and volume loss of the brain.  Brief Summary 78 y.o. male with with history of previous stroke seizures alcohol abuse COPD admitted on 01/10/2019 with recurrent falls and confusion/agitation-COVID-19 test negative, MRI brain confirms right caudate stroke. As per neurologist MRI findings of small right caudate stroke may not fully explain patient's neuro symptoms,    A/p  1) acute infarction of the right caudate--- neuro consult appreciated,, echo pending, carotid artery Dopplers without hemodynamically stenosis,, swallow eval pending, aspirin and Lipitor as ordered, allow permissive hypertension... A1c and lipid profile pending,, PT and OT eval pending,   2) metabolic encephalopathy with confusion in an alcoholic male--- ??? DTs... ?? SZ--currently on IV Keppra, use lorazepam as needed per CIWA protocol, Neurologist recommends high-dose thiamine IV at 500 mg 3 times daily x3 days then 250 mg IV 3 times daily x3 days then 100 mg p.o. after that thiamine and folic acid--- EEG pending.... As per neurologist MRI findings of small right caudate stroke may not fully explain patient's neuro symptoms, indicates encephalopathy cannot be ruled out, alcohol withdrawal is another differential , as well as seizures  3)Fever--- continue IV Vanco and Zosyn pending cultures  4)COPD/Smoker--- chest x-ray without acute pneumonia, no acute flareup, continue bronchodilators  5)H/o Sz--- EEG pending, continue Keppra   Patient seen and evaluated, chart reviewed, please see EMR for updated orders. Please see full H&P dictated by admitting physician Dr. Hal Hope for same  date of service.

## 2019-01-10 NOTE — Progress Notes (Signed)
STROKE TEAM PROGRESS NOTE   INTERVAL HISTORY I have personally reviewed history of presenting illness with the patient.  He presented with 2 recent falls followed by some confusion.  MRI does show tiny right caudate infarct I am not too sure whether this is related to his presentation or an incidental finding.  He denies any history suggestive of recent seizure and states he was compliant with his seizure medicines.  Vitals:   01/10/19 0700 01/10/19 0841 01/10/19 0842 01/10/19 1224  BP: 112/72 90/62  130/74  Pulse: (!) 59 (!) 49 (!) 52 (!) 58  Resp: 15 15  19   Temp:  (!) 97.5 F (36.4 C)  98.4 F (36.9 C)  TempSrc:  Oral  Oral  SpO2: 96% 100% 100% 98%  Weight:      Height:        CBC:  Recent Labs  Lab 01/09/19 2218 01/10/19 0022 01/10/19 0433  WBC 16.8*  --  13.9*  NEUTROABS 13.7*  --  10.6*  HGB 15.4 14.6 13.7  HCT 46.7 43.0 39.8  MCV 96.3  --  96.4  PLT 237  --  237    Basic Metabolic Panel:  Recent Labs  Lab 01/09/19 2218 01/10/19 0022 01/10/19 0433  NA 136 135 135  K 4.7 4.1 4.4  CL 104  --  104  CO2 18*  --  18*  GLUCOSE 92  --  90  BUN 15  --  16  CREATININE 1.16  --  1.09  CALCIUM 9.6  --  9.1   Lipid Panel:     Component Value Date/Time   CHOL 131 01/10/2019 0433   TRIG 66 01/10/2019 0433   HDL 49 01/10/2019 0433   CHOLHDL 2.7 01/10/2019 0433   VLDL 13 01/10/2019 0433   LDLCALC 69 01/10/2019 0433   HgbA1c:  Lab Results  Component Value Date   HGBA1C 4.7 (L) 01/10/2019   Urine Drug Screen:     Component Value Date/Time   LABOPIA NONE DETECTED 01/10/2019 0024   COCAINSCRNUR NONE DETECTED 01/10/2019 0024   LABBENZ NONE DETECTED 01/10/2019 0024   AMPHETMU NONE DETECTED 01/10/2019 0024   THCU NONE DETECTED 01/10/2019 0024   LABBARB NONE DETECTED 01/10/2019 0024    Alcohol Level     Component Value Date/Time   ETH <10 01/09/2019 2218    IMAGING Ct Head Wo Contrast  Result Date: 01/10/2019 CLINICAL DATA:  Recent falls EXAM: CT HEAD  WITHOUT CONTRAST CT CERVICAL SPINE WITHOUT CONTRAST TECHNIQUE: Multidetector CT imaging of the head and cervical spine was performed following the standard protocol without intravenous contrast. Multiplanar CT image reconstructions of the cervical spine were also generated. COMPARISON:  06/25/2018 FINDINGS: CT HEAD FINDINGS Brain: Chronic atrophic and ischemic changes are noted. No findings to suggest acute hemorrhage, acute infarction or space-occupying mass lesion are noted. Vascular: No hyperdense vessel or unexpected calcification. Skull: Normal. Negative for fracture or focal lesion. Sinuses/Orbits: Postsurgical changes in the anterior wall of the left maxillary antrum and lateral wall left orbit are again seen and stable. Other: None CT CERVICAL SPINE FINDINGS Alignment: Within normal limits. Skull base and vertebrae: 7 cervical segments are well visualized. Vertebral body height is well maintained. Disc space narrowing is noted from C3-C7 with mild osteophytic changes. Mild facet hypertrophic changes are noted. No acute fracture or acute facet abnormality is noted. Mild neural foraminal narrowing is noted bilaterally. Soft tissues and spinal canal: Surrounding soft tissues are within normal limits. Upper chest: Visualized lung apices  are unremarkable. Other: None IMPRESSION: CT of the head: Chronic atrophic and ischemic changes without acute intracranial abnormality. CT of the cervical spine: Multilevel degenerative change without acute abnormality. Electronically Signed   By: Inez Catalina M.D.   On: 01/10/2019 01:17   Ct Cervical Spine Wo Contrast  Result Date: 01/10/2019 CLINICAL DATA:  Recent falls EXAM: CT HEAD WITHOUT CONTRAST CT CERVICAL SPINE WITHOUT CONTRAST TECHNIQUE: Multidetector CT imaging of the head and cervical spine was performed following the standard protocol without intravenous contrast. Multiplanar CT image reconstructions of the cervical spine were also generated. COMPARISON:   06/25/2018 FINDINGS: CT HEAD FINDINGS Brain: Chronic atrophic and ischemic changes are noted. No findings to suggest acute hemorrhage, acute infarction or space-occupying mass lesion are noted. Vascular: No hyperdense vessel or unexpected calcification. Skull: Normal. Negative for fracture or focal lesion. Sinuses/Orbits: Postsurgical changes in the anterior wall of the left maxillary antrum and lateral wall left orbit are again seen and stable. Other: None CT CERVICAL SPINE FINDINGS Alignment: Within normal limits. Skull base and vertebrae: 7 cervical segments are well visualized. Vertebral body height is well maintained. Disc space narrowing is noted from C3-C7 with mild osteophytic changes. Mild facet hypertrophic changes are noted. No acute fracture or acute facet abnormality is noted. Mild neural foraminal narrowing is noted bilaterally. Soft tissues and spinal canal: Surrounding soft tissues are within normal limits. Upper chest: Visualized lung apices are unremarkable. Other: None IMPRESSION: CT of the head: Chronic atrophic and ischemic changes without acute intracranial abnormality. CT of the cervical spine: Multilevel degenerative change without acute abnormality. Electronically Signed   By: Inez Catalina M.D.   On: 01/10/2019 01:17   Mr Brain Wo Contrast  Result Date: 01/10/2019 CLINICAL DATA:  78 y/o  M; altered mental status and fall. EXAM: MRI HEAD WITHOUT CONTRAST TECHNIQUE: Multiplanar, multiecho pulse sequences of the brain and surrounding structures were obtained without intravenous contrast. COMPARISON:  01/09/2019 CT head. FINDINGS: Brain: 8 mm focus of reduced diffusion within the right caudate head (series 5, image 55) compatible with acute/early subacute infarction. No associated hemorrhage or mass effect. Motion degradation T2 and T2 FLAIR weighted sequences. Early confluent nonspecific T2 FLAIR hyperintensities in subcortical and periventricular white matter are compatible with moderate  chronic microvascular ischemic changes. Moderate volume loss of the brain. No intracranial hemorrhage, extra-axial collection, hydrocephalus or herniation. Vascular: Normal flow voids. Skull and upper cervical spine: Normal marrow signal. Sinuses/Orbits: Negative. Other: None. IMPRESSION: 1. 8 mm acute/early subacute infarction within the right caudate head. No hemorrhage or mass effect. 2. Moderate chronic microvascular ischemic changes and volume loss of the brain. These results were called by telephone at the time of interpretation on 01/10/2019 at 3:59 am to Dr. Leonides Schanz , who verbally acknowledged these results. Electronically Signed   By: Kristine Garbe M.D.   On: 01/10/2019 04:00   Dg Pelvis Portable  Result Date: 01/10/2019 CLINICAL DATA:  Fall.  Initial encounter. EXAM: PORTABLE PELVIS 1-2 VIEWS COMPARISON:  None. FINDINGS: There is no evidence of pelvic fracture or diastasis. No pelvic bone lesions are seen. IMPRESSION: No acute abnormality. Electronically Signed   By: San Morelle M.D.   On: 01/10/2019 07:11   Dg Chest Portable 1 View  Result Date: 01/09/2019 CLINICAL DATA:  Altered mental status and recent falls EXAM: PORTABLE CHEST 1 VIEW COMPARISON:  07/17/2018 FINDINGS: Cardiac shadow is within normal limits. Aortic calcifications are again noted. The lungs are well aerated bilaterally without focal infiltrate or sizable effusion. No bony  abnormality is seen. Old rib fractures are noted on the left. IMPRESSION: No acute abnormality noted. Electronically Signed   By: Inez Catalina M.D.   On: 01/09/2019 23:33   Vas US Carotid (at [place] Only)  Result Date: 01/10/2019 Carotid Arterial Duplex Study Indications: CVA. Performing Technologist: June Leap RDMS, RVT  Examination Guidelines: A complete evaluation includes B-mode imaging, spectral Doppler, color Doppler, and power Doppler as needed of all accessible portions of each vessel. Bilateral testing is considered an integral  part of a complete examination. Limited examinations for reoccurring indications may be performed as noted.  Right Carotid Findings: +----------+--------+--------+--------+------------+--------+           PSV cm/sEDV cm/sStenosisDescribe    Comments +----------+--------+--------+--------+------------+--------+ CCA Prox  114     21                                   +----------+--------+--------+--------+------------+--------+ CCA Distal75      19                                   +----------+--------+--------+--------+------------+--------+ ICA Prox  74      28      1-39%   heterogenous         +----------+--------+--------+--------+------------+--------+ ICA Distal116     33                                   +----------+--------+--------+--------+------------+--------+ ECA       112     15                                   +----------+--------+--------+--------+------------+--------+ +---------+--------+--+--------+-+---------+ VertebralPSV cm/s20EDV cm/s6Antegrade +---------+--------+--+--------+-+---------+  Left Carotid Findings: +----------+--------+--------+--------+------------+--------+           PSV cm/sEDV cm/sStenosisDescribe    Comments +----------+--------+--------+--------+------------+--------+ CCA Prox  95      10                                   +----------+--------+--------+--------+------------+--------+ CCA Distal78      7                                    +----------+--------+--------+--------+------------+--------+ ICA Prox  61      23      1-39%   heterogenous         +----------+--------+--------+--------+------------+--------+ ICA Distal77      22                                   +----------+--------+--------+--------+------------+--------+ ECA       71      12                                   +----------+--------+--------+--------+------------+--------+ +---------+--------+--+--------+-+---------+  VertebralPSV cm/s33EDV cm/s4Antegrade +---------+--------+--+--------+-+---------+  Summary: Right Carotid: Velocities in the right ICA are consistent with a 1-39% stenosis. Left Carotid: Velocities in the left ICA are consistent with a  1-39% stenosis. Vertebrals: Bilateral vertebral arteries demonstrate antegrade flow. *See table(s) above for measurements and observations.  Electronically signed by Antony Contras MD on 01/10/2019 at 12:37:16 PM.   Final     PHYSICAL EXAM Pleasant elderly male currently not in distress. . Afebrile. Head is nontraumatic. Neck is supple without bruit.    Cardiac exam no murmur or gallop. Lungs are clear to auscultation. Distal pulses are well felt. Neurological Exam :  Awake alert.  Oriented x2.  Diminished attention, registration and recall.  Easily distractible.  Slightly confused.  No aphasia or apraxia.  Follows simple midline and one-step commands.  Extraocular movements are full range without nystagmus.  Right pupil is postsurgical large and minimally reactive.  Left pupil is 4 mm and reactive.  Vision acuity is poor in the right eye.  Face is symmetric.  Tongue midline.  Hearing diminished bilaterally.  Motor system exam reveals no upper or lower extremity drift but moves lower extremities less than upper extremities.  There is antigravity strength in the lower extremities but not very cooperative for detailed testing.  No focal weakness.  Sensation appears intact.  Reflexes are symmetric.  Plantars downgoing.  Gait not tested.  ASSESSMENT/PLAN Mr. VENKAT ANKNEY is a 78 y.o. male with history of CVA, SZ, COPD, GERD, HTN, HLD, GERD, smoking, ETOH use presenting after 2 falls with confusion.    Stroke:  right caudate infarct not related to presenting symptoms, infarct incidental and related small vessel disease  CXR NAD  CT head chronic small vessel disease  & atrophy. No acute stroke.  CT CS multilevel degenerative changes  MRI  R caudate infarct. Mod Small  vessel disease. Atrophy.   CTA head & neck pending  Carotid Doppler  B ICA 1-39% stenosis, VAs antegrade   2D Echo pending   LDL 69  HgbA1c 4.7  SCDs for VTE prophylaxis  aspirin 81 mg daily prior to admission, now on aspirin 325 mg daily. Continue aspirin 325 mg alone at d/c  Therapy recommendations:  pending   Disposition:  pending   Hypertension  Elevated > 200 on arrival in ED  Stable . Ok for BP goal normotensive  Hyperlipidemia  Home meds:  Lopid 600  LDL 69, at goal < 70  Other Stroke Risk Factors  Advanced age  Cigarette smoker, advised to stop smoking  ETOH use, advised to drink no more than 2 drink(s) a day. On CIWA protocol  Hx stroke/TIA - Reported hx of strokes but all MRIs in system negative  Other Active Problems  Metabolic encephalopathy w/ confusion in an alcoholic male w/ hx seizures. Likely Baseline dementia  Seizure d/o on keppra. EEG pending   Fever on Vanc and zosyn. TM 100.4. WBC 16.8. lactic acid 2.7  COPD/smoker  Elevated troponin 0.03  Hospital day # 0  I have personally obtained history,examined this patient, reviewed notes, independently viewed imaging studies, participated in medical decision making and plan of care.ROS completed by me personally and pertinent positives fully documented  I have made any additions or clarifications directly to the above note.  Patient presented with a couple of falls and confusion MRI does show a right basal ganglia lacunar infarct and not sure that is responsible for his presentation.  Recommend check EEG for seizures and continue Keppra in the current dose and look for reversible causes of confusion like infection.  Aspirin for stroke prevention and aggressive risk factor modification.  Discussed with Dr. Joesph Fillers.  Greater than 50% time during  this 35-minute visit was spent on counseling and coordination of care about lacunar stroke and confusion and seizures and answering questions  Antony Contras, MD Medical Director [facility] Pager: [phone] 01/10/2019 3:10 PM   To contact Stroke Continuity provider, please refer to [email or web address] After hours, contact General Neurology

## 2019-01-11 MED ORDER — ENOXAPARIN SODIUM 40 MG/0.4ML ~~LOC~~ SOLN
40.0000 mg | SUBCUTANEOUS | Status: DC
  Administered 2019-01-11 – 2019-01-12 (×2): 40 mg via SUBCUTANEOUS
  Filled 2019-01-11 (×2): qty 0.4

## 2019-01-11 MED ORDER — SODIUM CHLORIDE 0.9 % IV SOLN
2.0000 g | INTRAVENOUS | Status: DC
  Administered 2019-01-11: 16:00:00 2 g via INTRAVENOUS
  Filled 2019-01-11 (×2): qty 20

## 2019-01-11 NOTE — Evaluation (Signed)
Occupational Therapy Evaluation Patient Details Name: Blake [name] MRN: [id number] DOB: [birth year] Today's Date: 01/11/2019    History of Present Illness Pt is a 78 y.o. male admitted 01/09/19 with AMS after two recent falls at home. ETOH (-). COVID (-). MRI shows acute/early subacute infarct in R caudate; no hemorrhage or mass effect. Other imaging negative for acute abnormality. Per neurology, the R caudate head stroke appears too small to fully account for signs/symptoms; DDx also includes acute Wernicke's encephalopathy. PMH includes stroke, ETOH abuse, COPD, HTN.   Clinical Impression   Pt seen for OT eval today. Pt presents with decreased balance, decreased activity tolerance, impaired cognition including safety, judgement, insight and problem solving. Pt would benefit from skilled OT to address these deficits to maximize independence. Pt states he has some friends who could assist with home mgmt task but not physical assistance.  Pt pleasantly confused during session and no family/freinds present to verify information    Follow Up Recommendations  SNF;CIR    Equipment Recommendations  Other (comment)(to be deferred to next venue)    Recommendations for Other Services Rehab consult     Precautions / Restrictions Precautions Precautions: Fall Restrictions Weight Bearing Restrictions: No      Mobility Bed Mobility Overal bed mobility: Needs Assistance Bed Mobility: Sit to Supine;Rolling;Supine to Sit Rolling: Independent   Supine to sit: Supervision Sit to supine: Supervision   General bed mobility comments: for safety   Transfers Overall transfer level: Needs assistance Equipment used: Rolling walker (2 wheeled) Transfers: Sit to/from Stand Sit to Stand: Min assist         General transfer comment: pt required min steadying assist when backing up    Balance Overall balance assessment: Needs assistance Sitting-balance support: No upper extremity supported;Feet  supported Sitting balance-Leahy Scale: Good(able to bend over to reach feet and to shfit side to side to adjust bedding and gown without LOB)     Standing balance support: No upper extremity supported Standing balance-Leahy Scale: Fair Standing balance comment: Pt with LOB when backing up; pt requires min guard to min a during functional tasks.                           ADL either performed or assessed with clinical judgement   ADL Overall ADL's : Needs assistance/impaired Eating/Feeding: Modified independent(extra time)   Grooming: Wash/dry hands;Wash/dry face;Set up(pt would likely need cues to initiate)   Upper Body Bathing: Supervision/ safety;Sitting   Lower Body Bathing: Minimal assistance;Sit to/from stand Lower Body Bathing Details (indicate cue type and reason): Pt had difficulty putting sock on due to decreased trunk and LE flexibility and decreased problem solving; pt also required min a for dynamic standing balance (supervision for static balance) Upper Body Dressing : Minimal assistance   Lower Body Dressing: Minimal assistance   Toilet Transfer: Minimal assistance;Ambulation Toilet Transfer Details (indicate cue type and reason): RW -pt required min steadying when backing up Shattuck and Hygiene: Supervision/safety   Tub/ Banker: Minimal assistance           Vision Baseline Vision/History: No visual deficits(per pt - pt with large pupil for R eye and pinpoint for L eye. Pt denies any visual disturbances) Patient Visual Report: No change from baseline Vision Assessment?: No apparent visual deficits;Yes(no apparent functional visual deficits)     Perception     Praxis      Pertinent Vitals/Pain Pain Assessment: No/denies pain  Hand Dominance Right   Extremity/Trunk Assessment Upper Extremity Assessment Upper Extremity Assessment: Overall WFL for tasks assessed   Lower Extremity Assessment Lower Extremity  Assessment: Generalized weakness       Communication Communication Communication: Hard of hearing   Cognition Arousal/Alertness: Awake/alert Behavior During Therapy: WFL for tasks assessed/performed Overall Cognitive Status: No family/caregiver present to determine baseline cognitive functioning Area of Impairment: Orientation;Memory;Following commands;Safety/judgement;Awareness;Problem solving                   Memory: Decreased recall of precautions;Decreased [place]-term memory Following Commands: Follows one step commands with increased time Safety/Judgement: Decreased awareness of deficits;Decreased awareness of safety   Problem Solving: Difficulty sequencing;Requires verbal cues;Requires tactile cues General Comments: Pt with history of signficant alcohol abuse unsure of baseline for cognitive functioning and no family present to provide info   General Comments       Exercises     Shoulder Instructions      Home Living Family/patient expects to be discharged to:: Private residence Living Arrangements: Alone Available Help at Discharge: Family;Friend(s);Available PRN/intermittently(pt states friends could do shopping etc but not help with any physical care) Type of Home: Mobile home Home Access: Level entry     Home Layout: One level     Bathroom Shower/Tub: Walk-in shower(pt reported shower stall to OT)   Bathroom Toilet: Standard     Home Equipment: None   Additional Comments: unsure of accuracy of information provided as pt was confused throughout with no family/caregivers present to confirm information      Prior Functioning/Environment Level of Independence: Independent        Comments: unsure of accuracy of information provided as pt was confused throughout with no family/caregivers present to confirm information        OT Problem List: Decreased activity tolerance;Decreased cognition;Impaired balance (sitting and/or standing);Decreased safety  awareness      OT Treatment/Interventions: Self-care/ADL training;Therapeutic exercise;Neuromuscular education;Therapeutic activities;Patient/family education;Balance training    OT Goals(Current goals can be found in the care plan section) Acute Rehab OT Goals Patient Stated Goal: I am doing fine not sure I have a goal OT Goal Formulation: With patient Time For Goal Achievement: 01/11/19 Potential to Achieve Goals: Good  OT Frequency: Min 2X/week   Barriers to D/C:            Co-evaluation              AM-PAC OT "6 Clicks" Daily Activity     Outcome Measure Help from another person eating meals?: None Help from another person taking care of personal grooming?: None Help from another person toileting, which includes using toliet, bedpan, or urinal?: A Little Help from another person bathing (including washing, rinsing, drying)?: A Little Help from another person to put on and taking off regular upper body clothing?: A Little Help from another person to put on and taking off regular lower body clothing?: A Little 6 Click Score: 20   End of Session Equipment Utilized During Treatment: Gait belt;Rolling walker  Activity Tolerance: Patient tolerated treatment well Patient left: in bed;with call bell/phone within reach;with bed alarm set  OT Visit Diagnosis: Unsteadiness on feet (R26.81);Other symptoms and signs involving cognitive function                Time: 5400-8676 OT Time Calculation (min): 38 min Charges:  OT General Charges $OT Visit: 1 Visit OT Evaluation $OT Eval Low Complexity: 1 Low OT Treatments $Self Care/Home Management : 8-22 mins  Quay Burow, OTR/L 01/11/2019, 11:50 AM

## 2019-01-11 NOTE — Care Management (Addendum)
Received a call back from patients niece: Blake [name]. She is in agreement with SNF rehab at d/c. She states he doesn't have Blake support he needs at d/c.  CM spoke to Blake [name] and he is agreeable to SNF rehab at d/c. He asked to be faxed out in his [place] [facility]. CSW updated.

## 2019-01-11 NOTE — Progress Notes (Signed)
Ok to optimize the zosyn to ceftriaxone since he doesn't have any hx of resistance. Abx will likely be dced tomorrow per Dr. Theola Sequin, PharmD, BCIDP, AAHIVP, CPP Infectious Disease Pharmacist 01/11/2019 2:38 PM

## 2019-01-11 NOTE — NC FL2 (Signed)
[place] LEVEL OF CARE SCREENING TOOL     IDENTIFICATION  Patient Name: Blake [name] Birthdate: March 18, 1941 Sex: male Admission Date (Current Location): 01/09/2019  [facility] and [place] Number:  Herbalist and Address:  [facility] [place], [place] [address] [place], [place] [address]      Provider Number: [id number]  Attending Physician Name and Address:  Roxan Hockey, MD  Relative Name and Phone Number:       Current Level of Care: Hospital Recommended Level of Care: [place] Prior Approval Number:    Date Approved/Denied:   PASRR Number:  [id number]   Discharge Plan: SNF    Current Diagnoses: Patient Active Problem List   Diagnosis Date Noted  . Acute CVA (cerebrovascular accident) ([place]) 01/10/2019  . Esophageal stricture   . Abnormal finding on GI tract imaging   . Dysphagia 07/20/2018  . Malnutrition of moderate degree 07/10/2018  . Aspiration pneumonia ([place]) 07/07/2018  . Seizure disorder ([place]) 07/07/2018  . HLD (hyperlipidemia) 07/07/2018  . Acute hypoxemic respiratory failure ([place]) 07/07/2018  . Encounter for intubation   . Acute respiratory failure with hypercapnia ([place])   . HCAP (healthcare-associated pneumonia)   . Protein-calorie malnutrition, severe 06/26/2018  . Alcohol withdrawal seizure ([place]) 06/25/2018  . New onset seizure ([place]) 01/07/2018  . Hypertension 01/07/2018  . GERD (gastroesophageal reflux disease) 01/07/2018  . Hyperlipidemia 01/07/2018  . Cigarette smoker 01/07/2018  . Alcohol abuse 01/07/2018  . Acute alcohol abuse, with delirium ([place])   . Acute encephalopathy 10/14/2014  . Unsteady gait 10/14/2014  . Alcohol withdrawal delirium ([place]) 10/14/2014  . Lipoma of neck 12/22/2012  . Cerebral atrophy ([place]) 09/28/2012  . Cerebrovascular small vessel disease 09/28/2012  . Left humeral fracture 07/04/2012  . Delirium 07/04/2012  . ETOH abuse   . ELEVATED PROSTATE SPECIFIC  ANTIGEN 09/30/2010  . COPD (chronic obstructive pulmonary disease) ([place]) 01/28/2009  . PULMONARY NODULE 01/28/2009  . INSOMNIA, CHRONIC 10/15/2007  . COLONIC POLYPS 10/10/2007  . Type 2 diabetes mellitus ([place]) 10/10/2007  . Dyslipidemia 10/10/2007  . Essential hypertension 10/10/2007  . GERD 10/10/2007  . DIVERTICULOSIS OF COLON 10/10/2007    Orientation RESPIRATION BLADDER Height & Weight     Self, Situation, Place  Normal Continent Weight: 68 kg Height:  5\' 10"  (177.8 cm)  BEHAVIORAL SYMPTOMS/MOOD NEUROLOGICAL BOWEL NUTRITION STATUS    Convulsions/Seizures Continent Diet(dysphagia 2 with thin liquids)  AMBULATORY STATUS COMMUNICATION OF NEEDS Skin   Extensive Assist Verbally Skin abrasions(Lt arm)                       Personal Care Assistance Level of Assistance  Bathing, Feeding, Dressing Bathing Assistance: Limited assistance Feeding assistance: Limited assistance Dressing Assistance: Limited assistance     Functional Limitations Info  Sight, Hearing, Speech Sight Info: Impaired Hearing Info: Impaired Speech Info: Adequate    SPECIAL CARE FACTORS FREQUENCY  PT (By licensed PT), OT (By licensed OT), Speech therapy     PT Frequency: 5x/wk OT Frequency: 5x/wk     Speech Therapy Frequency: 5x/wk      Contractures Contractures Info: Not present    Additional Factors Info  Code Status, Allergies Code Status Info: full Allergies Info: NKA           Current Medications (01/11/2019):  This is the current hospital active medication list Current Facility-Administered Medications  Medication Dose Route Frequency Provider Last Rate Last Dose  .  stroke: mapping  our early stages of recovery book   Does not apply Once Rise Patience, MD      . acetaminophen (TYLENOL) tablet 650 mg  650 mg Oral Q4H PRN Rise Patience, MD       Or  . acetaminophen (TYLENOL) solution 650 mg  650 mg Per Tube Q4H PRN Rise Patience, MD       Or  . acetaminophen  (TYLENOL) suppository 650 mg  650 mg Rectal Q4H PRN Rise Patience, MD      . aspirin suppository 300 mg  300 mg Rectal Daily Rise Patience, MD       Or  . aspirin tablet 325 mg  325 mg Oral Daily Rise Patience, MD   325 mg at 01/11/19 0901  . cefTRIAXone (ROCEPHIN) 2 g in sodium chloride 0.9 % 100 mL IVPB  2 g Intravenous Q24H Emokpae, Courage, MD      . chlorhexidine (PERIDEX) 0.12 % solution 15 mL  15 mL Mouth Rinse BID Rise Patience, MD   15 mL at 01/11/19 0902  . folic acid (FOLVITE) tablet 1 mg  1 mg Oral Daily Emokpae, Courage, MD   1 mg at 01/11/19 0901  . levETIRAcetam (KEPPRA) IVPB 500 mg/100 mL premix  500 mg Intravenous Q12H Rise Patience, MD 400 mL/hr at 01/11/19 0901 500 mg at 01/11/19 0901  . LORazepam (ATIVAN) tablet 1 mg  1 mg Oral Q6H PRN Roxan Hockey, MD       Or  . LORazepam (ATIVAN) injection 1 mg  1 mg Intravenous Q6H PRN Emokpae, Courage, MD      . MEDLINE mouth rinse  15 mL Mouth Rinse q12n4p Rise Patience, MD      . multivitamin with minerals tablet 1 tablet  1 tablet Oral Daily Roxan Hockey, MD   1 tablet at 01/11/19 0902  . [START ON 01/12/2019] thiamine (B-1) 250 mg in sodium chloride 0.9 % 50 mL IVPB  250 mg Intravenous TID Roxan Hockey, MD      . Derrill Memo ON 01/16/2019] thiamine (VITAMIN B-1) tablet 100 mg  100 mg Oral Daily Emokpae, Courage, MD      . thiamine 500mg  in normal saline (22ml) IVPB  500 mg Intravenous TID Kerney Elbe, MD 100 mL/hr at 01/11/19 0946 500 mg at 01/11/19 0946  . vancomycin (VANCOCIN) 1,750 mg in sodium chloride 0.9 % 500 mL IVPB  1,750 mg Intravenous Q36H Ronna Polio, RPH 250 mL/hr at 01/11/19 1257 1,750 mg at 01/11/19 1257     Discharge Medications: Please see discharge summary for a list of discharge medications.  Relevant Imaging Results:  Relevant Lab Results:   Additional Information SSN: [id number]  Pollie Friar, RN

## 2019-01-11 NOTE — Progress Notes (Signed)
Pharmacy is unable to confirm the medications the patient was taking at home. All options have been exhausted and a resolution to the situation is not expected.   All of the prescriptions on his list were prescribed in November when he was discharged from [facility]. There is no evidence that they were ever filled. Family was unable to help confirm if he was taking any medications PTA.   Where possible, their outpatient pharmacy(s) have been contacted for the last time prescriptions were filled and that information has been added to each medication in an Order Note (highlighted yellow below the medication).  Please contact pharmacy if further assistance is needed.   Romeo Rabon, PharmD. Mobile: [phone]. 01/11/2019,8:05 AM.

## 2019-01-11 NOTE — Progress Notes (Signed)
Physical Therapy Evaluation  Clinical Assessment: Pt presented supine in bed with HOB elevated, awake and willing to participate in therapy session. Pt pleasantly confused throughout. Pt reported that he was previously independent with all functional mobility and ADLs; however, no family/caregivers present to confirm information provided by pt. At the time of evaluation, pt required min guard for bed mobility, min A for transfers and mod A to take a few steps in his room. Pt would continue to benefit from skilled physical therapy services at this time while admitted and after d/c to address the below listed limitations in order to improve overall safety and independence with functional mobility.  Sherie Don, [place], DPT  Acute Rehabilitation Services Pager [phone] Office [phone]    01/11/19 1122  PT Visit Information  Last PT Received On 01/11/19  Assistance Needed +1 (+2 for chair follow might be helpful for amb progression)  History of Present Illness Pt is a 78 y.o. male admitted 01/09/19 with AMS after two recent falls at home. ETOH (-). COVID (-). MRI shows acute/early subacute infarct in R caudate; no hemorrhage or mass effect. Other imaging negative for acute abnormality. Per neurology, the R caudate head stroke appears too small to fully account for signs/symptoms; DDx also includes acute Wernicke's encephalopathy. PMH includes stroke, ETOH abuse, COPD, HTN.  Precautions  Precautions Fall  Restrictions  Weight Bearing Restrictions No  Home Living  Family/patient expects to be discharged to: Private residence  Living Arrangements Alone  Available Help at Discharge Family;Friend(s);Available PRN/intermittently  Home Access Level entry  Home Layout One level  Bathroom Shower/Tub Tub/shower unit  Home Equipment None  Additional Comments unsure of accuracy of information provided as pt was confused throughout with no family/caregivers present to confirm information  Prior  Function  Level of Independence Independent  Comments unsure of accuracy of information provided as pt was confused throughout with no family/caregivers present to confirm information  Communication  Communication No difficulties  Pain Assessment  Pain Assessment No/denies pain  Cognition  Arousal/Alertness Awake/alert  Behavior During Therapy [place] for tasks assessed/performed  Overall Cognitive Status No family/caregiver present to determine baseline cognitive functioning  Area of Impairment Orientation;Memory;Following commands;Safety/judgement;Awareness;Problem solving  Orientation Level Disoriented to;Time;Situation  Memory Decreased recall of precautions;Decreased [place]-term memory  Following Commands Follows one step commands with increased time  Safety/Judgement Decreased awareness of deficits;Decreased awareness of safety  Problem Solving Difficulty sequencing;Requires verbal cues;Requires tactile cues  Upper Extremity Assessment  Upper Extremity Assessment Defer to OT evaluation  Lower Extremity Assessment  Lower Extremity Assessment Generalized weakness  Bed Mobility  Overal bed mobility Needs Assistance  Bed Mobility Supine to Sit;Sit to Supine  Supine to sit Min guard  Sit to supine Min guard  General bed mobility comments increased time and effort, min guard for safety, HOB elevated  Transfers  Overall transfer level Needs assistance  Equipment used None  Transfers Sit to/from Stand  Sit to Stand Min assist  General transfer comment min A for safety and stability with transition into standing from EOB  Ambulation/Gait  Ambulation/Gait assistance Mod assist  Gait Distance (Feet) 5 Feet  Assistive device IV Pole;1 person hand held assist  Gait Pattern/deviations Step-to pattern;Decreased step length - right;Decreased step length - left;Decreased stride length;Shuffle  General Gait Details pt with modest instability requiring mod A and multimodal cueing to maintain  attention to task. Pt with very poor safety awareness and insight into deficits  Gait velocity decreased  Modified Rankin (Stroke Patients Only)  Pre-Morbid  Rankin Score 0  Modified Rankin 4  Balance  Overall balance assessment Needs assistance;History of Falls  Sitting-balance support Feet supported  Sitting balance-Leahy Scale Fair  Standing balance support During functional activity;No upper extremity supported;Single extremity supported;Bilateral upper extremity supported  Standing balance-Leahy Scale Poor  PT - End of Session  Equipment Utilized During Treatment Gait belt  Activity Tolerance Patient tolerated treatment well  Patient left in bed;with call bell/phone within reach;with bed alarm set  Nurse Communication Mobility status  PT Assessment  PT Recommendation/Assessment Patient needs continued PT services  PT Visit Diagnosis Other abnormalities of gait and mobility (R26.89)  PT Problem List Decreased strength;Decreased activity tolerance;Decreased balance;Decreased mobility;Decreased coordination;Decreased cognition;Decreased knowledge of use of DME;Decreased safety awareness;Decreased knowledge of precautions  PT Plan  PT Frequency (ACUTE ONLY) Min 3X/week  PT Treatment/Interventions (ACUTE ONLY) DME instruction;Gait training;Stair training;Functional mobility training;Therapeutic activities;Therapeutic exercise;Balance training;Neuromuscular re-education;Cognitive remediation;Patient/family education  AM-PAC PT "6 Clicks" Mobility Outcome Measure (Version 2)  Help needed turning from your back to your side while in a flat bed without using bedrails? 3  Help needed moving from lying on your back to sitting on the side of a flat bed without using bedrails? 3  Help needed moving to and from a bed to a chair (including a wheelchair)? 2  Help needed standing up from a chair using your arms (e.g., wheelchair or bedside chair)? 3  Help needed to walk in hospital room? 2  Help  needed climbing 3-5 steps with a railing?  1  6 Click Score 14  Consider Recommendation of Discharge To: CIR/SNF/LTACH  PT Recommendation  Follow Up Recommendations SNF  PT equipment None recommended by PT  Individuals Consulted  Consulted and Agree with Results and Recommendations Patient unable/family or caregiver not available  Acute Rehab PT Goals  Patient Stated Goal to finish eating his breakfast  PT Goal Formulation Patient unable to participate in goal setting  Time For Goal Achievement 01/25/19  Potential to Achieve Goals Good  PT Time Calculation  PT Start Time (ACUTE ONLY) 1001  PT Stop Time (ACUTE ONLY) 1021  PT Time Calculation (min) (ACUTE ONLY) 20 min  PT General Charges  $$ ACUTE PT VISIT 1 Visit  PT Evaluation  $PT Eval Moderate Complexity 1 Mod

## 2019-01-11 NOTE — Progress Notes (Signed)
Rehab Admissions Coordinator Note:  Patient was screened by Cleatrice Burke for appropriateness for an Inpatient Acute Rehab Consult per PT and OT recommendations for post acute rehab.  At this time, we are recommending Inpatient Rehab consult. I will contact MD for order.  Cleatrice Burke RN MSN 01/11/2019, 12:59 PM  I can be reached at [phone].

## 2019-01-11 NOTE — Progress Notes (Signed)
Patient Demographics:    Blake [name], is a 78 y.o. male, DOB - [birth year], TTS:[id number]  Admit date - 01/09/2019   Admitting Physician Rise Patience, MD  Outpatient Primary MD for the patient is Patient, No Pcp Per  LOS - 1   Chief Complaint  Patient presents with   Fall        Subjective:    Blake [name] today has no fevers, no emesis,  No chest pain, remains intermittently confused  Assessment  & Plan :    Principal Problem:   Acute CVA (cerebrovascular accident) ([place]) Active Problems:   ETOH abuse   Acute encephalopathy   Hypertension   Seizure disorder ([place])    Brain MRI IMPRESSION: 1) 8 mm acute/early subacute infarction within the right caudate head. No hemorrhage or mass effect. 2. Moderate chronic microvascular ischemic changes and volume loss of the brain.  Brief Summary 78 y.o.malewithwith history of previous stroke seizures alcohol abuse COPD admitted on 01/10/2019 with recurrent falls and confusion/agitation-COVID-19 test negative, MRI brain confirms right caudate stroke. As per neurologist MRI findings of small right caudate stroke may not fully explain patient's neuro symptoms,    A/p  1) acute infarction of the right caudate--- neuro consult appreciated,, echo with EF of 55 to 09%, diastolic dysfunction noted, no regional wall motion normalities, no intracardiac thrombus, carotid artery Dopplers without hemodynamically stenosis,, swallow eval appreciated,  continue aspirin 325 and Lipitor as ordered, allow permissive hypertension... A1c and lipid profile pending,, PT and OT eval pending,   2) metabolic encephalopathy with confusion in an alcoholic male--- ??? DTs... ?? SZ--currently on IV Keppra, use lorazepam as needed per CIWA protocol, Neurologist recommends high-dose thiamine IV at 500 mg 3 times daily x3 days then 250 mg IV 3 times daily x3 days then 100 mg  p.o. after that thiamine and folic acid--- EEG pending.... As per neurologist MRI findings of small right caudate stroke may not fully explain patient's neuro symptoms, indicates encephalopathy cannot be ruled out, alcohol withdrawal is another differential , as well as seizures  3)Fever---  okay to stop IV Vanco and Zosyn cultures are negative so far, patient received Rocephin x1 dose on 01/11/2019  4)COPD/Smoker--- chest x-ray without acute pneumonia, no acute flareup, continue bronchodilators  5)H/o Sz--- EEG without acute findings, continue Keppra  6)Dysphagia----speech and swallow eval appreciated, do recommend dysphagia 2 diet with thin liquids may take pills whole in applesauce  7)Disposition--- PT recommends inpatient rehab  Disposition/Need for in-Hospital Stay- patient unable to be discharged at this time due to awaiting evaluation for possible transfer to inpatient rehab  Code Status : Full code  Family Communication:   na   Disposition Plan  : Possible transfer to inpatient rehab  Consults  : Neurology  DVT Prophylaxis  :  Lovenox -  Lab Results  Component Value Date   PLT 197 01/10/2019    Inpatient Medications  Scheduled Meds:   stroke: mapping our early stages of recovery book   Does not apply Once   aspirin  300 mg Rectal Daily   Or   aspirin  325 mg Oral Daily   chlorhexidine  15 mL Mouth Rinse BID   folic acid  1 mg Oral Daily  mouth rinse  15 mL Mouth Rinse q12n4p   multivitamin with minerals  1 tablet Oral Daily   [START ON 01/16/2019] thiamine  100 mg Oral Daily   Continuous Infusions:  cefTRIAXone (ROCEPHIN)  IV 2 g (01/11/19 1536)   levETIRAcetam 500 mg (01/11/19 0901)   [START ON 01/12/2019] thiamine injection     thiamine injection 500 mg (01/11/19 0946)   vancomycin 1,750 mg (01/11/19 1257)   PRN Meds:.acetaminophen **OR** acetaminophen (TYLENOL) oral liquid 160 mg/5 mL **OR** acetaminophen, LORazepam **OR**  LORazepam    Anti-infectives (From admission, onward)   Start     Dose/Rate Route Frequency Ordered Stop   01/11/19 1500  cefTRIAXone (ROCEPHIN) 2 g in sodium chloride 0.9 % 100 mL IVPB     2 g 200 mL/hr over 30 Minutes Intravenous Every 24 hours 01/11/19 1435     01/11/19 1300  vancomycin (VANCOCIN) 1,750 mg in sodium chloride 0.9 % 500 mL IVPB     1,750 mg 250 mL/hr over 120 Minutes Intravenous Every 36 hours 01/10/19 0608     01/10/19 0615  piperacillin-tazobactam (ZOSYN) IVPB 3.375 g  Status:  Discontinued     3.375 g 12.5 mL/hr over 240 Minutes Intravenous Every 8 hours 01/10/19 0608 01/11/19 1435   01/10/19 0045  vancomycin (VANCOCIN) IVPB 1000 mg/200 mL premix     1,000 mg 200 mL/hr over 60 Minutes Intravenous  Once 01/10/19 0033 01/10/19 0215   01/10/19 0045  piperacillin-tazobactam (ZOSYN) IVPB 3.375 g     3.375 g 100 mL/hr over 30 Minutes Intravenous  Once 01/10/19 0033 01/10/19 0145        Objective:   Vitals:   01/11/19 0034 01/11/19 0811 01/11/19 1155 01/11/19 1548  BP: (!) 134/58 (!) 96/48 (!) 135/58 (!) 141/70  Pulse: (!) 58 (!) 47 (!) 55 (!) 54  Resp: 17 16 16 16   Temp: 98.4 F (36.9 C) 98.3 F (36.8 C) 97.8 F (36.6 C) 98 F (36.7 C)  TempSrc: Oral Oral Oral Oral  SpO2: 100% 98% 100% 97%  Weight:      Height:        Wt Readings from Last 3 Encounters:  01/10/19 68 kg  08/11/18 64.5 kg  07/25/18 64.8 kg     Intake/Output Summary (Last 24 hours) at 01/11/2019 1741 Last data filed at 01/11/2019 1554 Gross per 24 hour  Intake 480 ml  Output 900 ml  Net -420 ml   Physical Exam Patient is examined daily including today on 01/11/19 , exams remain the same as of yesterday except that has changed   Gen:- Awake, confused and disoriented HEENT:- [employer].AT, No sclera icterus Neck-Supple Neck,No JVD,.  Lungs-  CTAB , fair symmetrical air movement CV- S1, S2 normal, regular  Abd-  +ve B.Sounds, Abd Soft, No tenderness,    Extremity/Skin:- No  edema, pedal  pulses present  Psych-affect is appropriate, oriented x3 Neuro-no new focal deficits, no tremors   Data Review:   Micro Results Recent Results (from the past 240 hour(s))  Blood culture (routine x 2)     Status: None (Preliminary result)   Collection Time: 01/10/19 12:35 AM  Result Value Ref Range Status   Specimen Description BLOOD RIGHT FOREARM  Final   Special Requests   Final    BOTTLES DRAWN AEROBIC AND ANAEROBIC Blood Culture results may not be optimal due to an excessive volume of blood received in culture bottles   Culture   Final    NO GROWTH 1 DAY Performed  at [facility], [place] [address], [place], [place] [address]    Report Status PENDING  Incomplete  Blood culture (routine x 2)     Status: None (Preliminary result)   Collection Time: 01/10/19  1:10 AM  Result Value Ref Range Status   Specimen Description BLOOD LEFT HAND  Final   Special Requests   Final    BOTTLES DRAWN AEROBIC AND ANAEROBIC Blood Culture adequate volume   Culture   Final    NO GROWTH 1 DAY Performed at [facility], [place] [address], [place], [place] [address]    Report Status PENDING  Incomplete  SARS Coronavirus 2 [facility] order, Performed in [place] hospital lab)     Status: None   Collection Time: 01/10/19  1:47 AM  Result Value Ref Range Status   SARS Coronavirus 2 NEGATIVE NEGATIVE Final    Comment: (NOTE) If result is NEGATIVE SARS-CoV-2 target nucleic acids are NOT DETECTED. The SARS-CoV-2 RNA is generally detectable in upper and lower  respiratory specimens during the acute phase of infection. The lowest  concentration of SARS-CoV-2 viral copies this assay can detect is 250  copies / mL. A negative result does not preclude SARS-CoV-2 infection  and should not be used as the sole basis for treatment or other  patient management decisions.  A negative result may occur with  improper specimen collection / handling, submission of specimen other  than nasopharyngeal swab,  presence of viral mutation(s) within the  areas targeted by this assay, and inadequate number of viral copies  (<250 copies / mL). A negative result must be combined with clinical  observations, patient history, and epidemiological information. If result is POSITIVE SARS-CoV-2 target nucleic acids are DETECTED. The SARS-CoV-2 RNA is generally detectable in upper and lower  respiratory specimens dur ing the acute phase of infection.  Positive  results are indicative of active infection with SARS-CoV-2.  Clinical  correlation with patient history and other diagnostic information is  necessary to determine patient infection status.  Positive results do  not rule out bacterial infection or co-infection with other viruses. If result is PRESUMPTIVE POSTIVE SARS-CoV-2 nucleic acids MAY BE PRESENT.   A presumptive positive result was obtained on the submitted specimen  and confirmed on repeat testing.  While 2019 novel coronavirus  (SARS-CoV-2) nucleic acids may be present in the submitted sample  additional confirmatory testing may be necessary for epidemiological  and / or clinical management purposes  to differentiate between  SARS-CoV-2 and other Sarbecovirus currently known to infect humans.  If clinically indicated additional testing with an alternate test  methodology [phone]) is advised. The SARS-CoV-2 RNA is generally  detectable in upper and lower respiratory sp ecimens during the acute  phase of infection. The expected result is Negative. Fact Sheet for Patients:  StrictlyIdeas.no Fact Sheet for Healthcare Providers: BankingDealers.co.za This test is not yet approved or cleared by the [place] FDA and has been authorized for detection and/or diagnosis of SARS-CoV-2 by FDA under an Emergency Use Authorization (EUA).  This EUA will remain in effect (meaning this test can be used) for the duration of the COVID-19 declaration under  Section 564(b)(1) of the Act, 21 U.S.C. section 360bbb-3(b)(1), unless the authorization is terminated or revoked sooner. Performed at [facility], [place] [address], [place], [place] [address]     Radiology Reports Ct Angio Head W Or Wo Contrast  Result Date: 01/10/2019 CLINICAL DATA:  Stroke follow-up EXAM: CT ANGIOGRAPHY  HEAD AND NECK TECHNIQUE: Multidetector CT imaging of the head and neck was performed using the standard protocol during bolus administration of intravenous contrast. Multiplanar CT image reconstructions and MIPs were obtained to evaluate the vascular anatomy. Carotid stenosis measurements (when applicable) are obtained utilizing NASCET criteria, using the distal internal carotid diameter as the denominator. CONTRAST:  6mL OMNIPAQUE IOHEXOL 350 MG/ML SOLN COMPARISON:  Head CT 01/09/2019 FINDINGS: CTA NECK FINDINGS SKELETON: There is no bony spinal canal stenosis. No lytic or blastic lesion. OTHER NECK: Normal pharynx, larynx and major salivary glands. No cervical lymphadenopathy. Unremarkable thyroid gland. UPPER CHEST: Biapical emphysema. AORTIC ARCH: There is mild calcific atherosclerosis of the aortic arch. There is no aneurysm, dissection or hemodynamically significant stenosis of the visualized ascending aorta and aortic arch. Conventional 3 vessel aortic branching pattern. The visualized proximal subclavian arteries are widely patent. RIGHT CAROTID SYSTEM: --Common carotid artery: Widely patent origin without common carotid artery dissection or aneurysm. --Internal carotid artery: No dissection, occlusion or aneurysm. There is mixed density atherosclerosis extending into the proximal ICA, resulting in less than 50% stenosis. --External carotid artery: No acute abnormality. LEFT CAROTID SYSTEM: --Common carotid artery: Widely patent origin without common carotid artery dissection or aneurysm. --Internal carotid artery: No dissection, occlusion or aneurysm. Mild atherosclerotic  calcification at the carotid bifurcation without hemodynamically significant stenosis. --External carotid artery: No acute abnormality. VERTEBRAL ARTERIES: Right dominant configuration. Both origins are normal. No dissection, occlusion or flow-limiting stenosis to the vertebrobasilar confluence. CTA HEAD FINDINGS POSTERIOR CIRCULATION: --Vertebral arteries: Normal codominant configuration of V4 segments. --Posterior inferior cerebellar arteries (PICA): Both originate from the right vertebral artery. --Anterior inferior cerebellar arteries (AICA): Normal left AICA. Right AICA originates from the superior cerebellar artery. --Basilar artery: Normal. --Superior cerebellar arteries: Normal. --Posterior cerebral arteries (PCA): There is a fetal origin of the right PCA. There is multifocal moderate right PCA P2 segment stenosis. The left PCA is occluded at the proximal P2 segment. ANTERIOR CIRCULATION: --Intracranial internal carotid arteries: Atherosclerotic calcification of the internal carotid arteries at the skull base without hemodynamically significant stenosis. --Anterior cerebral arteries (ACA): Normal. Both A1 segments are present. Patent anterior communicating artery (a-comm). --Middle cerebral arteries (MCA): Normal. VENOUS SINUSES: As permitted by contrast timing, patent. ANATOMIC VARIANTS: None Review of the MIP images confirms the above findings. IMPRESSION: 1. No emergent large vessel occlusion. 2. Occlusion of the left PCA at the proximal P2 segment. 3. Multifocal moderate right PCA P2 segment stenosis. 4. Bilateral carotid bifurcation atherosclerosis without hemodynamically significant stenosis by NASCET criteria. Electronically Signed   By: Ulyses Jarred M.D.   On: 01/10/2019 15:24   Ct Head Wo Contrast  Result Date: 01/10/2019 CLINICAL DATA:  Recent falls EXAM: CT HEAD WITHOUT CONTRAST CT CERVICAL SPINE WITHOUT CONTRAST TECHNIQUE: Multidetector CT imaging of the head and cervical spine was performed  following the standard protocol without intravenous contrast. Multiplanar CT image reconstructions of the cervical spine were also generated. COMPARISON:  06/25/2018 FINDINGS: CT HEAD FINDINGS Brain: Chronic atrophic and ischemic changes are noted. No findings to suggest acute hemorrhage, acute infarction or space-occupying mass lesion are noted. Vascular: No hyperdense vessel or unexpected calcification. Skull: Normal. Negative for fracture or focal lesion. Sinuses/Orbits: Postsurgical changes in the anterior wall of the left maxillary antrum and lateral wall left orbit are again seen and stable. Other: None CT CERVICAL SPINE FINDINGS Alignment: Within normal limits. Skull base and vertebrae: 7 cervical segments are well visualized. Vertebral body height is well maintained. Disc space narrowing is noted  from C3-C7 with mild osteophytic changes. Mild facet hypertrophic changes are noted. No acute fracture or acute facet abnormality is noted. Mild neural foraminal narrowing is noted bilaterally. Soft tissues and spinal canal: Surrounding soft tissues are within normal limits. Upper chest: Visualized lung apices are unremarkable. Other: None IMPRESSION: CT of the head: Chronic atrophic and ischemic changes without acute intracranial abnormality. CT of the cervical spine: Multilevel degenerative change without acute abnormality. Electronically Signed   By: Inez Catalina M.D.   On: 01/10/2019 01:17   Ct Angio Neck W Or Wo Contrast  Result Date: 01/10/2019 CLINICAL DATA:  Stroke follow-up EXAM: CT ANGIOGRAPHY HEAD AND NECK TECHNIQUE: Multidetector CT imaging of the head and neck was performed using the standard protocol during bolus administration of intravenous contrast. Multiplanar CT image reconstructions and MIPs were obtained to evaluate the vascular anatomy. Carotid stenosis measurements (when applicable) are obtained utilizing NASCET criteria, using the distal internal carotid diameter as the denominator.  CONTRAST:  54mL OMNIPAQUE IOHEXOL 350 MG/ML SOLN COMPARISON:  Head CT 01/09/2019 FINDINGS: CTA NECK FINDINGS SKELETON: There is no bony spinal canal stenosis. No lytic or blastic lesion. OTHER NECK: Normal pharynx, larynx and major salivary glands. No cervical lymphadenopathy. Unremarkable thyroid gland. UPPER CHEST: Biapical emphysema. AORTIC ARCH: There is mild calcific atherosclerosis of the aortic arch. There is no aneurysm, dissection or hemodynamically significant stenosis of the visualized ascending aorta and aortic arch. Conventional 3 vessel aortic branching pattern. The visualized proximal subclavian arteries are widely patent. RIGHT CAROTID SYSTEM: --Common carotid artery: Widely patent origin without common carotid artery dissection or aneurysm. --Internal carotid artery: No dissection, occlusion or aneurysm. There is mixed density atherosclerosis extending into the proximal ICA, resulting in less than 50% stenosis. --External carotid artery: No acute abnormality. LEFT CAROTID SYSTEM: --Common carotid artery: Widely patent origin without common carotid artery dissection or aneurysm. --Internal carotid artery: No dissection, occlusion or aneurysm. Mild atherosclerotic calcification at the carotid bifurcation without hemodynamically significant stenosis. --External carotid artery: No acute abnormality. VERTEBRAL ARTERIES: Right dominant configuration. Both origins are normal. No dissection, occlusion or flow-limiting stenosis to the vertebrobasilar confluence. CTA HEAD FINDINGS POSTERIOR CIRCULATION: --Vertebral arteries: Normal codominant configuration of V4 segments. --Posterior inferior cerebellar arteries (PICA): Both originate from the right vertebral artery. --Anterior inferior cerebellar arteries (AICA): Normal left AICA. Right AICA originates from the superior cerebellar artery. --Basilar artery: Normal. --Superior cerebellar arteries: Normal. --Posterior cerebral arteries (PCA): There is a fetal  origin of the right PCA. There is multifocal moderate right PCA P2 segment stenosis. The left PCA is occluded at the proximal P2 segment. ANTERIOR CIRCULATION: --Intracranial internal carotid arteries: Atherosclerotic calcification of the internal carotid arteries at the skull base without hemodynamically significant stenosis. --Anterior cerebral arteries (ACA): Normal. Both A1 segments are present. Patent anterior communicating artery (a-comm). --Middle cerebral arteries (MCA): Normal. VENOUS SINUSES: As permitted by contrast timing, patent. ANATOMIC VARIANTS: None Review of the MIP images confirms the above findings. IMPRESSION: 1. No emergent large vessel occlusion. 2. Occlusion of the left PCA at the proximal P2 segment. 3. Multifocal moderate right PCA P2 segment stenosis. 4. Bilateral carotid bifurcation atherosclerosis without hemodynamically significant stenosis by NASCET criteria. Electronically Signed   By: Ulyses Jarred M.D.   On: 01/10/2019 15:24   Ct Cervical Spine Wo Contrast  Result Date: 01/10/2019 CLINICAL DATA:  Recent falls EXAM: CT HEAD WITHOUT CONTRAST CT CERVICAL SPINE WITHOUT CONTRAST TECHNIQUE: Multidetector CT imaging of the head and cervical spine was performed following the standard protocol without  intravenous contrast. Multiplanar CT image reconstructions of the cervical spine were also generated. COMPARISON:  06/25/2018 FINDINGS: CT HEAD FINDINGS Brain: Chronic atrophic and ischemic changes are noted. No findings to suggest acute hemorrhage, acute infarction or space-occupying mass lesion are noted. Vascular: No hyperdense vessel or unexpected calcification. Skull: Normal. Negative for fracture or focal lesion. Sinuses/Orbits: Postsurgical changes in the anterior wall of the left maxillary antrum and lateral wall left orbit are again seen and stable. Other: None CT CERVICAL SPINE FINDINGS Alignment: Within normal limits. Skull base and vertebrae: 7 cervical segments are well  visualized. Vertebral body height is well maintained. Disc space narrowing is noted from C3-C7 with mild osteophytic changes. Mild facet hypertrophic changes are noted. No acute fracture or acute facet abnormality is noted. Mild neural foraminal narrowing is noted bilaterally. Soft tissues and spinal canal: Surrounding soft tissues are within normal limits. Upper chest: Visualized lung apices are unremarkable. Other: None IMPRESSION: CT of the head: Chronic atrophic and ischemic changes without acute intracranial abnormality. CT of the cervical spine: Multilevel degenerative change without acute abnormality. Electronically Signed   By: Inez Catalina M.D.   On: 01/10/2019 01:17   Mr Brain Wo Contrast  Result Date: 01/10/2019 CLINICAL DATA:  78 y/o  M; altered mental status and fall. EXAM: MRI HEAD WITHOUT CONTRAST TECHNIQUE: Multiplanar, multiecho pulse sequences of the brain and surrounding structures were obtained without intravenous contrast. COMPARISON:  01/09/2019 CT head. FINDINGS: Brain: 8 mm focus of reduced diffusion within the right caudate head (series 5, image 55) compatible with acute/early subacute infarction. No associated hemorrhage or mass effect. Motion degradation T2 and T2 FLAIR weighted sequences. Early confluent nonspecific T2 FLAIR hyperintensities in subcortical and periventricular white matter are compatible with moderate chronic microvascular ischemic changes. Moderate volume loss of the brain. No intracranial hemorrhage, extra-axial collection, hydrocephalus or herniation. Vascular: Normal flow voids. Skull and upper cervical spine: Normal marrow signal. Sinuses/Orbits: Negative. Other: None. IMPRESSION: 1. 8 mm acute/early subacute infarction within the right caudate head. No hemorrhage or mass effect. 2. Moderate chronic microvascular ischemic changes and volume loss of the brain. These results were called by telephone at the time of interpretation on 01/10/2019 at 3:59 am to Dr. Leonides Schanz ,  who verbally acknowledged these results. Electronically Signed   By: Kristine Garbe M.D.   On: 01/10/2019 04:00   Dg Pelvis Portable  Result Date: 01/10/2019 CLINICAL DATA:  Fall.  Initial encounter. EXAM: PORTABLE PELVIS 1-2 VIEWS COMPARISON:  None. FINDINGS: There is no evidence of pelvic fracture or diastasis. No pelvic bone lesions are seen. IMPRESSION: No acute abnormality. Electronically Signed   By: San Morelle M.D.   On: 01/10/2019 07:11   Dg Chest Portable 1 View  Result Date: 01/09/2019 CLINICAL DATA:  Altered mental status and recent falls EXAM: PORTABLE CHEST 1 VIEW COMPARISON:  07/17/2018 FINDINGS: Cardiac shadow is within normal limits. Aortic calcifications are again noted. The lungs are well aerated bilaterally without focal infiltrate or sizable effusion. No bony abnormality is seen. Old rib fractures are noted on the left. IMPRESSION: No acute abnormality noted. Electronically Signed   By: Inez Catalina M.D.   On: 01/09/2019 23:33   Dg Swallowing Func-speech Pathology  Result Date: 01/10/2019 Objective Swallowing Evaluation: Type of Study: MBS-Modified Barium Swallow Study  Patient Details Name: TRAVANTI MCMANUS MRN: [id number] Date of Birth: [birth year] Today's Date: 01/10/2019 Time: SLP Start Time (ACUTE ONLY): 1501 -SLP Stop Time (ACUTE ONLY): 1521 SLP Time Calculation (min) (ACUTE ONLY): 20  min Past Medical History: Past Medical History: Diagnosis Date  Chronic insomnia   Cigarette smoker   COPD (chronic obstructive pulmonary disease) (HCC)   Diverticulosis of colon   Elevated prostate specific antigen (PSA)   ETOH abuse   GERD (gastroesophageal reflux disease)   Hx of colonic polyps   Hyperlipidemia   Hypertension   Pulmonary nodule   Shortness of breath   Stroke [facility])  Past Surgical History: Past Surgical History: Procedure Laterality Date  COLONOSCOPY    ESOPHAGOGASTRODUODENOSCOPY (EGD) WITH PROPOFOL N/A 07/21/2018  Procedure: ESOPHAGOGASTRODUODENOSCOPY  (EGD) WITH PROPOFOL;  Surgeon: Lavena Bullion, DO;  Location: [facility] ENDOSCOPY;  Service: Gastroenterology;  Laterality: N/A;  needle biopsy RUL nodule  01/2009  benign  TONSILLECTOMY  1947  VASECTOMY   HPI: TRISTON SKARE is a 78 y.o. male with with history of stroke seizures, alcohol abuse, COPD was brought to the ER after patient's friend called EMS since patient had 2 falls over the last 24 hours with confusion. CT head was unremarkable. COVID-19 test was negative. MRI 8 mm acute/early subacute infarction within the right caudate head. No hemorrhage or mass effect, moderate chronic microvascular ischemic changes and volume loss of the brain. Barium esophagram 07/19/18 distal esophageal stricture extending for a length of approximately 17 mm, narrowing the esophagus to approximately 6-7 mm. This has the appearance of a benign stricture, significant esophageal dysmotility with tertiary contractions and barium stasis. MBS 07/15/18 primary esophageal dysphagia, backflow from esophagus into pharynx, residue throughout esophagus. Flash penetration of thin. CXR No acute abnormality noted.  No data recorded Assessment / Plan / Recommendation CHL IP CLINICAL IMPRESSIONS 01/10/2019 Clinical Impression Pt demonstrates min-mild oropharyngeal dysphagia (oral > pharyngeal) and evidence of previously diagnosed esophageal dysphagia. Difficulty gathering and containing bolus in a cupped manner resulting in sublingual spill with thin and nectar. Mild lingual residue, sensed with subswallow to clear. Overall, pharyngeal phase was within functional limits. There was minimal residue in vallecular that did not increase as study progressed. Laryngeal elevation and epiglottic deflection was functional to prevent any penetration or aspiration during studys. Video image of esophagus showed slow transit and periods of widened then very narrow places along esophagus. Recommend Dys 2, thin liquids, straws allowed, remain upright 45 min  after meals, small sips and pills whole in applesauce. ST will continue to follow.    SLP Visit Diagnosis Dysphagia, unspecified (R13.10) Attention and concentration deficit following -- Frontal lobe and executive function deficit following -- Impact on safety and function Moderate aspiration risk   CHL IP TREATMENT RECOMMENDATION 01/10/2019 Treatment Recommendations Defer until completion of intrumental exam   Prognosis 07/17/2018 Prognosis for Safe Diet Advancement Good Barriers to Reach Goals -- Barriers/Prognosis Comment -- CHL IP DIET RECOMMENDATION 07/24/2018 SLP Diet Recommendations -- Liquid Administration via -- Medication Administration -- Compensations Slow rate;Small sips/bites;Follow solids with liquid Postural Changes --   CHL IP OTHER RECOMMENDATIONS 07/17/2018 Recommended Consults Consider esophageal assessment Oral Care Recommendations -- Other Recommendations --   CHL IP FOLLOW UP RECOMMENDATIONS 07/24/2018 Follow up Recommendations Skilled Nursing facility   [facility] IP FREQUENCY AND DURATION 07/17/2018 Speech Therapy Frequency (ACUTE ONLY) min 3x week Treatment Duration 1 week      CHL IP ORAL PHASE 01/10/2019 Oral Phase Impaired Oral - Pudding Teaspoon -- Oral - Pudding Cup -- Oral - Honey Teaspoon -- Oral - Honey Cup -- Oral - Nectar Teaspoon -- Oral - Nectar Cup Decreased bolus cohesion;Other (Comment) Oral - Nectar Straw -- Oral - Thin Teaspoon --  Oral - Thin Cup Decreased bolus cohesion;Lingual/palatal residue Oral - Thin Straw Decreased bolus cohesion Oral - Puree -- Oral - Mech Soft -- Oral - Regular (No Data) Oral - Multi-Consistency -- Oral - Pill -- Oral Phase - Comment --  CHL IP PHARYNGEAL PHASE 01/10/2019 Pharyngeal Phase Impaired Pharyngeal- Pudding Teaspoon -- Pharyngeal -- Pharyngeal- Pudding Cup -- Pharyngeal -- Pharyngeal- Honey Teaspoon -- Pharyngeal -- Pharyngeal- Honey Cup -- Pharyngeal -- Pharyngeal- Nectar Teaspoon -- Pharyngeal -- Pharyngeal- Nectar Cup Pharyngeal residue -  valleculae Pharyngeal -- Pharyngeal- Nectar Straw -- Pharyngeal -- Pharyngeal- Thin Teaspoon -- Pharyngeal -- Pharyngeal- Thin Cup Pharyngeal residue - valleculae Pharyngeal -- Pharyngeal- Thin Straw Pharyngeal residue - valleculae Pharyngeal -- Pharyngeal- Puree -- Pharyngeal -- Pharyngeal- Mechanical Soft -- Pharyngeal -- Pharyngeal- Regular (No Data) Pharyngeal -- Pharyngeal- Multi-consistency -- Pharyngeal -- Pharyngeal- Pill -- Pharyngeal -- Pharyngeal Comment --  CHL IP CERVICAL ESOPHAGEAL PHASE 01/10/2019 Cervical Esophageal Phase (No Data) Pudding Teaspoon -- Pudding Cup -- Honey Teaspoon -- Honey Cup -- Nectar Teaspoon -- Nectar Cup -- Nectar Straw -- Thin Teaspoon -- Thin Cup -- Thin Straw -- Puree -- Mechanical Soft -- Regular -- Multi-consistency -- Pill -- Cervical Esophageal Comment -- [place] Siren 01/10/2019, 4:03 PM Orbie Pyo Litaker M.Ed Actor Pager [phone] Office [phone]              Vas US Carotid (at [place] Only)  Result Date: 01/10/2019 Carotid Arterial Duplex Study Indications: CVA. Performing Technologist: June Leap RDMS, RVT  Examination Guidelines: A complete evaluation includes B-mode imaging, spectral Doppler, color Doppler, and power Doppler as needed of all accessible portions of each vessel. Bilateral testing is considered an integral part of a complete examination. Limited examinations for reoccurring indications may be performed as noted.  Right Carotid Findings: +----------+--------+--------+--------+------------+--------+             PSV cm/s EDV cm/s Stenosis Describe     Comments  +----------+--------+--------+--------+------------+--------+  CCA Prox   114      21                                       +----------+--------+--------+--------+------------+--------+  CCA Distal 75       19                                       +----------+--------+--------+--------+------------+--------+  ICA Prox   74       28       1-39%     heterogenous           +----------+--------+--------+--------+------------+--------+  ICA Distal 116      33                                       +----------+--------+--------+--------+------------+--------+  ECA        112      15                                       +----------+--------+--------+--------+------------+--------+ +---------+--------+--+--------+-+---------+  Vertebral PSV cm/s 20 EDV cm/s 6 Antegrade  +---------+--------+--+--------+-+---------+  Left Carotid Findings: +----------+--------+--------+--------+------------+--------+  PSV cm/s EDV cm/s Stenosis Describe     Comments  +----------+--------+--------+--------+------------+--------+  CCA Prox   95       10                                       +----------+--------+--------+--------+------------+--------+  CCA Distal 78       7                                        +----------+--------+--------+--------+------------+--------+  ICA Prox   61       23       1-39%    heterogenous           +----------+--------+--------+--------+------------+--------+  ICA Distal 77       22                                       +----------+--------+--------+--------+------------+--------+  ECA        71       12                                       +----------+--------+--------+--------+------------+--------+ +---------+--------+--+--------+-+---------+  Vertebral PSV cm/s 33 EDV cm/s 4 Antegrade  +---------+--------+--+--------+-+---------+  Summary: Right Carotid: Velocities in the right ICA are consistent with a 1-39% stenosis. Left Carotid: Velocities in the left ICA are consistent with a 1-39% stenosis. Vertebrals: Bilateral vertebral arteries demonstrate antegrade flow. *See table(s) above for measurements and observations.  Electronically signed by Antony Contras MD on 01/10/2019 at 12:37:16 PM.   Final      CBC Recent Labs  Lab 01/09/19 2218 01/10/19 0022 01/10/19 0433  WBC 16.8*  --  13.9*  HGB 15.4 14.6 13.7  HCT 46.7 43.0 39.8   PLT 237  --  197  MCV 96.3  --  96.4  MCH 31.8  --  33.2  MCHC 33.0  --  34.4  RDW 13.2  --  13.4  LYMPHSABS 1.6  --  1.7  MONOABS 1.3*  --  1.4*  EOSABS 0.1  --  0.1  BASOSABS 0.0  --  0.0    Chemistries  Recent Labs  Lab 01/09/19 2218 01/10/19 0022 01/10/19 0433  NA 136 135 135  K 4.7 4.1 4.4  CL 104  --  104  CO2 18*  --  18*  GLUCOSE 92  --  90  BUN 15  --  16  CREATININE 1.16  --  1.09  CALCIUM 9.6  --  9.1  AST 44*  --  39  ALT 14  --  16  ALKPHOS 90  --  71  BILITOT 1.9*  --  2.1*   ------------------------------------------------------------------------------------------------------------------ Recent Labs    01/10/19 0433  CHOL 131  HDL 49  LDLCALC 69  TRIG 66  CHOLHDL 2.7    Lab Results  Component Value Date   HGBA1C 4.7 (L) 01/10/2019   ------------------------------------------------------------------------------------------------------------------ Recent Labs    01/10/19 0433  TSH 0.391   ------------------------------------------------------------------------------------------------------------------ No results for input(s): VITAMINB12, FOLATE, FERRITIN, TIBC, IRON, RETICCTPCT in the last 72 hours.  Coagulation profile Recent Labs  Lab 01/09/19 2314  INR  1.0    No results for input(s): DDIMER in the last 72 hours.  Cardiac Enzymes Recent Labs  Lab 01/10/19 0433  TROPONINI 0.03*   ------------------------------------------------------------------------------------------------------------------ No results found for: BNP   Roxan Hockey M.D on 01/11/2019 at 5:41 PM  Go to [email or web address] - for contact info  [facility] - Office  [phone]

## 2019-01-11 NOTE — TOC Initial Note (Signed)
Transition of Care [facility]) - Initial/Assessment Note    Patient Details  Name: Blake [name] MRN: [id number] Date of Birth: [birth year]  Transition of Care [facility] CM/SW Contact:    Pollie Friar, RN Phone Number: 01/11/2019, 12:24 PM  Clinical Narrative:                 Pt states he lives home alone. CM inquired about his niece that's listed and he states he has little contact with her. Per pt, Nicole Kindred is a friend that helps him the most.  CM continues to be unable to reach [place] by phone.  Expected Discharge Plan: Skilled Nursing Facility Barriers to Discharge: Continued Medical Work up   Patient Goals and CMS Choice     Choice offered to / list presented to : Patient  Expected Discharge Plan and Services Expected Discharge Plan: [place] In-house Referral: Clinical Social Work     Living arrangements for the past 2 months: Single Family Home(one level)                          Prior Living Arrangements/Services Living arrangements for the past 2 months: Single Family Home(one level) Lives with:: Self Patient language and need for interpreter reviewed:: Yes(no needs) Do you feel safe going back to the place where you live?: Yes      Need for Family Participation in Patient Care: Yes (Comment) Care giver support system in place?: No (comment)( no family available) Current home services: DME(cane, walker, wheelchair) Criminal Activity/Legal Involvement Pertinent to Current Situation/Hospitalization: No - Comment as needed  Activities of Daily Living      Permission Sought/Granted   Permission granted to share information with : Yes, Verbal Permission Granted  Share Information with NAME: Ovid Curd     Permission granted to share info w Relationship: friend  Permission granted to share info w Contact Information: [phone]  Emotional Assessment Appearance:: Appears older than stated age, Disheveled Attitude/Demeanor/Rapport: Engaged Affect  (typically observed): Accepting, Pleasant, Appropriate Orientation: : Oriented to Self, Oriented to Place Alcohol / Substance Use: Tobacco Use Psych Involvement: No (comment)  Admission diagnosis:  [place] [W19.XXXA] Right-sided cerebrovascular accident (CVA) ([place]) [I63.9] Acute CVA (cerebrovascular accident) [facility]) [I63.9] Patient Active Problem List   Diagnosis Date Noted  . Acute CVA (cerebrovascular accident) ([place]) 01/10/2019  . Esophageal stricture   . Abnormal finding on GI tract imaging   . Dysphagia 07/20/2018  . Malnutrition of moderate degree 07/10/2018  . Aspiration pneumonia ([place]) 07/07/2018  . Seizure disorder ([place]) 07/07/2018  . HLD (hyperlipidemia) 07/07/2018  . Acute hypoxemic respiratory failure ([place]) 07/07/2018  . Encounter for intubation   . Acute respiratory failure with hypercapnia ([place])   . HCAP (healthcare-associated pneumonia)   . Protein-calorie malnutrition, severe 06/26/2018  . Alcohol withdrawal seizure ([place]) 06/25/2018  . New onset seizure ([place]) 01/07/2018  . Hypertension 01/07/2018  . GERD (gastroesophageal reflux disease) 01/07/2018  . Hyperlipidemia 01/07/2018  . Cigarette smoker 01/07/2018  . Alcohol abuse 01/07/2018  . Acute alcohol abuse, with delirium ([place])   . Acute encephalopathy 10/14/2014  . Unsteady gait 10/14/2014  . Alcohol withdrawal delirium ([place]) 10/14/2014  . Lipoma of neck 12/22/2012  . Cerebral atrophy ([place]) 09/28/2012  . Cerebrovascular small vessel disease 09/28/2012  . Left humeral fracture 07/04/2012  . Delirium 07/04/2012  . ETOH abuse   . ELEVATED PROSTATE SPECIFIC ANTIGEN 09/30/2010  . COPD (chronic obstructive pulmonary disease) ([place]) 01/28/2009  . PULMONARY NODULE  01/28/2009  . INSOMNIA, CHRONIC 10/15/2007  . COLONIC POLYPS 10/10/2007  . Type 2 diabetes mellitus ([place]) 10/10/2007  . Dyslipidemia 10/10/2007  . Essential hypertension 10/10/2007  . GERD 10/10/2007  . DIVERTICULOSIS OF COLON 10/10/2007   PCP:   Patient, No Pcp Per Pharmacy:  No Pharmacies Listed    Social Determinants of Health (SDOH) Interventions  Inquired about him not taking meds at home and he states he didn't know he was supposed to be taking medication. Pt states he thinks Nicole Kindred does his grocery shopping. He states Nicole Kindred would take him to MD appts.   Readmission Risk Interventions No flowsheet data found.

## 2019-01-11 NOTE — Progress Notes (Signed)
Inpatient Rehabilitation-Admissions Coordinator   Met with pt at the bedside. He does not have the recommended caregiver support needed after a [place] CIR stay.  AC recommends SNF at this time. CM/SW aware.   AC will sign off.   Jhonnie Garner, OTR/L  Rehab Admissions Coordinator  [phone] 01/11/2019 2:57 PM

## 2019-01-11 NOTE — Progress Notes (Signed)
STROKE TEAM PROGRESS NOTE   INTERVAL HISTORY Patient is doing well .  Echocardiogram was unremarkable.  Carotid ultrasound showed no significant stenosis.  EEG was normal.  Vitals:   01/10/19 2054 01/11/19 0034 01/11/19 0811 01/11/19 1155  BP: (!) 140/45 (!) 134/58 (!) 96/48 (!) 135/58  Pulse: (!) 46 (!) 58 (!) 47 (!) 55  Resp: 16 17 16 16   Temp:  98.4 F (36.9 C) 98.3 F (36.8 C) 97.8 F (36.6 C)  TempSrc: Oral Oral Oral Oral  SpO2: 100% 100% 98% 100%  Weight:      Height:        CBC:  Recent Labs  Lab 01/09/19 2218 01/10/19 0022 01/10/19 0433  WBC 16.8*  --  13.9*  NEUTROABS 13.7*  --  10.6*  HGB 15.4 14.6 13.7  HCT 46.7 43.0 39.8  MCV 96.3  --  96.4  PLT 237  --  941    Basic Metabolic Panel:  Recent Labs  Lab 01/09/19 2218 01/10/19 0022 01/10/19 0433  NA 136 135 135  K 4.7 4.1 4.4  CL 104  --  104  CO2 18*  --  18*  GLUCOSE 92  --  90  BUN 15  --  16  CREATININE 1.16  --  1.09  CALCIUM 9.6  --  9.1   Lipid Panel:     Component Value Date/Time   CHOL 131 01/10/2019 0433   TRIG 66 01/10/2019 0433   HDL 49 01/10/2019 0433   CHOLHDL 2.7 01/10/2019 0433   VLDL 13 01/10/2019 0433   LDLCALC 69 01/10/2019 0433   HgbA1c:  Lab Results  Component Value Date   HGBA1C 4.7 (L) 01/10/2019   Urine Drug Screen:     Component Value Date/Time   LABOPIA NONE DETECTED 01/10/2019 0024   COCAINSCRNUR NONE DETECTED 01/10/2019 0024   LABBENZ NONE DETECTED 01/10/2019 0024   AMPHETMU NONE DETECTED 01/10/2019 0024   THCU NONE DETECTED 01/10/2019 0024   LABBARB NONE DETECTED 01/10/2019 0024    Alcohol Level     Component Value Date/Time   ETH <10 01/09/2019 2218    IMAGING Ct Angio Head W Or Wo Contrast  Result Date: 01/10/2019 CLINICAL DATA:  Stroke follow-up EXAM: CT ANGIOGRAPHY HEAD AND NECK TECHNIQUE: Multidetector CT imaging of the head and neck was performed using the standard protocol during bolus administration of intravenous contrast. Multiplanar CT  image reconstructions and MIPs were obtained to evaluate the vascular anatomy. Carotid stenosis measurements (when applicable) are obtained utilizing NASCET criteria, using the distal internal carotid diameter as the denominator. CONTRAST:  24mL OMNIPAQUE IOHEXOL 350 MG/ML SOLN COMPARISON:  Head CT 01/09/2019 FINDINGS: CTA NECK FINDINGS SKELETON: There is no bony spinal canal stenosis. No lytic or blastic lesion. OTHER NECK: Normal pharynx, larynx and major salivary glands. No cervical lymphadenopathy. Unremarkable thyroid gland. UPPER CHEST: Biapical emphysema. AORTIC ARCH: There is mild calcific atherosclerosis of the aortic arch. There is no aneurysm, dissection or hemodynamically significant stenosis of the visualized ascending aorta and aortic arch. Conventional 3 vessel aortic branching pattern. The visualized proximal subclavian arteries are widely patent. RIGHT CAROTID SYSTEM: --Common carotid artery: Widely patent origin without common carotid artery dissection or aneurysm. --Internal carotid artery: No dissection, occlusion or aneurysm. There is mixed density atherosclerosis extending into the proximal ICA, resulting in less than 50% stenosis. --External carotid artery: No acute abnormality. LEFT CAROTID SYSTEM: --Common carotid artery: Widely patent origin without common carotid artery dissection or aneurysm. --Internal carotid artery: No dissection,  occlusion or aneurysm. Mild atherosclerotic calcification at the carotid bifurcation without hemodynamically significant stenosis. --External carotid artery: No acute abnormality. VERTEBRAL ARTERIES: Right dominant configuration. Both origins are normal. No dissection, occlusion or flow-limiting stenosis to the vertebrobasilar confluence. CTA HEAD FINDINGS POSTERIOR CIRCULATION: --Vertebral arteries: Normal codominant configuration of V4 segments. --Posterior inferior cerebellar arteries (PICA): Both originate from the right vertebral artery. --Anterior  inferior cerebellar arteries (AICA): Normal left AICA. Right AICA originates from the superior cerebellar artery. --Basilar artery: Normal. --Superior cerebellar arteries: Normal. --Posterior cerebral arteries (PCA): There is a fetal origin of the right PCA. There is multifocal moderate right PCA P2 segment stenosis. The left PCA is occluded at the proximal P2 segment. ANTERIOR CIRCULATION: --Intracranial internal carotid arteries: Atherosclerotic calcification of the internal carotid arteries at the skull base without hemodynamically significant stenosis. --Anterior cerebral arteries (ACA): Normal. Both A1 segments are present. Patent anterior communicating artery (a-comm). --Middle cerebral arteries (MCA): Normal. VENOUS SINUSES: As permitted by contrast timing, patent. ANATOMIC VARIANTS: None Review of the MIP images confirms the above findings. IMPRESSION: 1. No emergent large vessel occlusion. 2. Occlusion of the left PCA at the proximal P2 segment. 3. Multifocal moderate right PCA P2 segment stenosis. 4. Bilateral carotid bifurcation atherosclerosis without hemodynamically significant stenosis by NASCET criteria. Electronically Signed   By: Ulyses Jarred M.D.   On: 01/10/2019 15:24   Ct Head Wo Contrast  Result Date: 01/10/2019 CLINICAL DATA:  Recent falls EXAM: CT HEAD WITHOUT CONTRAST CT CERVICAL SPINE WITHOUT CONTRAST TECHNIQUE: Multidetector CT imaging of the head and cervical spine was performed following the standard protocol without intravenous contrast. Multiplanar CT image reconstructions of the cervical spine were also generated. COMPARISON:  06/25/2018 FINDINGS: CT HEAD FINDINGS Brain: Chronic atrophic and ischemic changes are noted. No findings to suggest acute hemorrhage, acute infarction or space-occupying mass lesion are noted. Vascular: No hyperdense vessel or unexpected calcification. Skull: Normal. Negative for fracture or focal lesion. Sinuses/Orbits: Postsurgical changes in the anterior  wall of the left maxillary antrum and lateral wall left orbit are again seen and stable. Other: None CT CERVICAL SPINE FINDINGS Alignment: Within normal limits. Skull base and vertebrae: 7 cervical segments are well visualized. Vertebral body height is well maintained. Disc space narrowing is noted from C3-C7 with mild osteophytic changes. Mild facet hypertrophic changes are noted. No acute fracture or acute facet abnormality is noted. Mild neural foraminal narrowing is noted bilaterally. Soft tissues and spinal canal: Surrounding soft tissues are within normal limits. Upper chest: Visualized lung apices are unremarkable. Other: None IMPRESSION: CT of the head: Chronic atrophic and ischemic changes without acute intracranial abnormality. CT of the cervical spine: Multilevel degenerative change without acute abnormality. Electronically Signed   By: Inez Catalina M.D.   On: 01/10/2019 01:17   Ct Angio Neck W Or Wo Contrast  Result Date: 01/10/2019 CLINICAL DATA:  Stroke follow-up EXAM: CT ANGIOGRAPHY HEAD AND NECK TECHNIQUE: Multidetector CT imaging of the head and neck was performed using the standard protocol during bolus administration of intravenous contrast. Multiplanar CT image reconstructions and MIPs were obtained to evaluate the vascular anatomy. Carotid stenosis measurements (when applicable) are obtained utilizing NASCET criteria, using the distal internal carotid diameter as the denominator. CONTRAST:  62mL OMNIPAQUE IOHEXOL 350 MG/ML SOLN COMPARISON:  Head CT 01/09/2019 FINDINGS: CTA NECK FINDINGS SKELETON: There is no bony spinal canal stenosis. No lytic or blastic lesion. OTHER NECK: Normal pharynx, larynx and major salivary glands. No cervical lymphadenopathy. Unremarkable thyroid gland. UPPER CHEST: Biapical emphysema.  AORTIC ARCH: There is mild calcific atherosclerosis of the aortic arch. There is no aneurysm, dissection or hemodynamically significant stenosis of the visualized ascending aorta and  aortic arch. Conventional 3 vessel aortic branching pattern. The visualized proximal subclavian arteries are widely patent. RIGHT CAROTID SYSTEM: --Common carotid artery: Widely patent origin without common carotid artery dissection or aneurysm. --Internal carotid artery: No dissection, occlusion or aneurysm. There is mixed density atherosclerosis extending into the proximal ICA, resulting in less than 50% stenosis. --External carotid artery: No acute abnormality. LEFT CAROTID SYSTEM: --Common carotid artery: Widely patent origin without common carotid artery dissection or aneurysm. --Internal carotid artery: No dissection, occlusion or aneurysm. Mild atherosclerotic calcification at the carotid bifurcation without hemodynamically significant stenosis. --External carotid artery: No acute abnormality. VERTEBRAL ARTERIES: Right dominant configuration. Both origins are normal. No dissection, occlusion or flow-limiting stenosis to the vertebrobasilar confluence. CTA HEAD FINDINGS POSTERIOR CIRCULATION: --Vertebral arteries: Normal codominant configuration of V4 segments. --Posterior inferior cerebellar arteries (PICA): Both originate from the right vertebral artery. --Anterior inferior cerebellar arteries (AICA): Normal left AICA. Right AICA originates from the superior cerebellar artery. --Basilar artery: Normal. --Superior cerebellar arteries: Normal. --Posterior cerebral arteries (PCA): There is a fetal origin of the right PCA. There is multifocal moderate right PCA P2 segment stenosis. The left PCA is occluded at the proximal P2 segment. ANTERIOR CIRCULATION: --Intracranial internal carotid arteries: Atherosclerotic calcification of the internal carotid arteries at the skull base without hemodynamically significant stenosis. --Anterior cerebral arteries (ACA): Normal. Both A1 segments are present. Patent anterior communicating artery (a-comm). --Middle cerebral arteries (MCA): Normal. VENOUS SINUSES: As permitted by  contrast timing, patent. ANATOMIC VARIANTS: None Review of the MIP images confirms the above findings. IMPRESSION: 1. No emergent large vessel occlusion. 2. Occlusion of the left PCA at the proximal P2 segment. 3. Multifocal moderate right PCA P2 segment stenosis. 4. Bilateral carotid bifurcation atherosclerosis without hemodynamically significant stenosis by NASCET criteria. Electronically Signed   By: Ulyses Jarred M.D.   On: 01/10/2019 15:24   Ct Cervical Spine Wo Contrast  Result Date: 01/10/2019 CLINICAL DATA:  Recent falls EXAM: CT HEAD WITHOUT CONTRAST CT CERVICAL SPINE WITHOUT CONTRAST TECHNIQUE: Multidetector CT imaging of the head and cervical spine was performed following the standard protocol without intravenous contrast. Multiplanar CT image reconstructions of the cervical spine were also generated. COMPARISON:  06/25/2018 FINDINGS: CT HEAD FINDINGS Brain: Chronic atrophic and ischemic changes are noted. No findings to suggest acute hemorrhage, acute infarction or space-occupying mass lesion are noted. Vascular: No hyperdense vessel or unexpected calcification. Skull: Normal. Negative for fracture or focal lesion. Sinuses/Orbits: Postsurgical changes in the anterior wall of the left maxillary antrum and lateral wall left orbit are again seen and stable. Other: None CT CERVICAL SPINE FINDINGS Alignment: Within normal limits. Skull base and vertebrae: 7 cervical segments are well visualized. Vertebral body height is well maintained. Disc space narrowing is noted from C3-C7 with mild osteophytic changes. Mild facet hypertrophic changes are noted. No acute fracture or acute facet abnormality is noted. Mild neural foraminal narrowing is noted bilaterally. Soft tissues and spinal canal: Surrounding soft tissues are within normal limits. Upper chest: Visualized lung apices are unremarkable. Other: None IMPRESSION: CT of the head: Chronic atrophic and ischemic changes without acute intracranial abnormality.  CT of the cervical spine: Multilevel degenerative change without acute abnormality. Electronically Signed   By: Inez Catalina M.D.   On: 01/10/2019 01:17   Mr Brain Wo Contrast  Result Date: 01/10/2019 CLINICAL DATA:  78 y/o  M; altered mental status and fall. EXAM: MRI HEAD WITHOUT CONTRAST TECHNIQUE: Multiplanar, multiecho pulse sequences of the brain and surrounding structures were obtained without intravenous contrast. COMPARISON:  01/09/2019 CT head. FINDINGS: Brain: 8 mm focus of reduced diffusion within the right caudate head (series 5, image 55) compatible with acute/early subacute infarction. No associated hemorrhage or mass effect. Motion degradation T2 and T2 FLAIR weighted sequences. Early confluent nonspecific T2 FLAIR hyperintensities in subcortical and periventricular white matter are compatible with moderate chronic microvascular ischemic changes. Moderate volume loss of the brain. No intracranial hemorrhage, extra-axial collection, hydrocephalus or herniation. Vascular: Normal flow voids. Skull and upper cervical spine: Normal marrow signal. Sinuses/Orbits: Negative. Other: None. IMPRESSION: 1. 8 mm acute/early subacute infarction within the right caudate head. No hemorrhage or mass effect. 2. Moderate chronic microvascular ischemic changes and volume loss of the brain. These results were called by telephone at the time of interpretation on 01/10/2019 at 3:59 am to Dr. Leonides Schanz , who verbally acknowledged these results. Electronically Signed   By: Kristine Garbe M.D.   On: 01/10/2019 04:00   Dg Pelvis Portable  Result Date: 01/10/2019 CLINICAL DATA:  Fall.  Initial encounter. EXAM: PORTABLE PELVIS 1-2 VIEWS COMPARISON:  None. FINDINGS: There is no evidence of pelvic fracture or diastasis. No pelvic bone lesions are seen. IMPRESSION: No acute abnormality. Electronically Signed   By: San Morelle M.D.   On: 01/10/2019 07:11   Dg Chest Portable 1 View  Result Date:  01/09/2019 CLINICAL DATA:  Altered mental status and recent falls EXAM: PORTABLE CHEST 1 VIEW COMPARISON:  07/17/2018 FINDINGS: Cardiac shadow is within normal limits. Aortic calcifications are again noted. The lungs are well aerated bilaterally without focal infiltrate or sizable effusion. No bony abnormality is seen. Old rib fractures are noted on the left. IMPRESSION: No acute abnormality noted. Electronically Signed   By: Inez Catalina M.D.   On: 01/09/2019 23:33   Dg Swallowing Func-speech Pathology  Result Date: 01/10/2019 Objective Swallowing Evaluation: Type of Study: MBS-Modified Barium Swallow Study  Patient Details Name: Blake [name] MRN: [id number] Date of Birth: [birth year] Today's Date: 01/10/2019 Time: SLP Start Time (ACUTE ONLY): 1501 -SLP Stop Time (ACUTE ONLY): 1521 SLP Time Calculation (min) (ACUTE ONLY): 20 min Past Medical History: Past Medical History: Diagnosis Date . Chronic insomnia  . Cigarette smoker  . COPD (chronic obstructive pulmonary disease) ([place])  . Diverticulosis of colon  . Elevated prostate specific antigen (PSA)  . ETOH abuse  . GERD (gastroesophageal reflux disease)  . Hx of colonic polyps  . Hyperlipidemia  . Hypertension  . Pulmonary nodule  . Shortness of breath  . Stroke [facility])  Past Surgical History: Past Surgical History: Procedure Laterality Date . COLONOSCOPY   . ESOPHAGOGASTRODUODENOSCOPY (EGD) WITH PROPOFOL N/A 07/21/2018  Procedure: ESOPHAGOGASTRODUODENOSCOPY (EGD) WITH PROPOFOL;  Surgeon: Lavena Bullion, DO;  Location: [facility];  Service: Gastroenterology;  Laterality: N/A; . needle biopsy RUL nodule  01/2009  benign . TONSILLECTOMY  1947 . VASECTOMY   HPI: KAYLAN FRIEDMANN is a 78 y.o. male with with history of stroke seizures, alcohol abuse, COPD was brought to the ER after patient's friend called EMS since patient had 2 falls over the last 24 hours with confusion. CT head was unremarkable. COVID-19 test was negative. MRI 8 mm acute/early subacute infarction  within the right caudate head. No hemorrhage or mass effect, moderate chronic microvascular ischemic changes and volume loss of the brain. Barium esophagram 07/19/18 distal esophageal stricture extending for  a length of approximately 17 mm, narrowing the esophagus to approximately 6-7 mm. This has the appearance of a benign stricture, significant esophageal dysmotility with tertiary contractions and barium stasis. MBS 07/15/18 primary esophageal dysphagia, backflow from esophagus into pharynx, residue throughout esophagus. Flash penetration of thin. CXR No acute abnormality noted.  No data recorded Assessment / Plan / Recommendation CHL IP CLINICAL IMPRESSIONS 01/10/2019 Clinical Impression Pt demonstrates min-mild oropharyngeal dysphagia (oral > pharyngeal) and evidence of previously diagnosed esophageal dysphagia. Difficulty gathering and containing bolus in a cupped manner resulting in sublingual spill with thin and nectar. Mild lingual residue, sensed with subswallow to clear. Overall, pharyngeal phase was within functional limits. There was minimal residue in vallecular that did not increase as study progressed. Laryngeal elevation and epiglottic deflection was functional to prevent any penetration or aspiration during studys. Video image of esophagus showed slow transit and periods of widened then very narrow places along esophagus. Recommend Dys 2, thin liquids, straws allowed, remain upright 45 min after meals, small sips and pills whole in applesauce. ST will continue to follow.    SLP Visit Diagnosis Dysphagia, unspecified (R13.10) Attention and concentration deficit following -- Frontal lobe and executive function deficit following -- Impact on safety and function Moderate aspiration risk   CHL IP TREATMENT RECOMMENDATION 01/10/2019 Treatment Recommendations Defer until completion of intrumental exam   Prognosis 07/17/2018 Prognosis for Safe Diet Advancement Good Barriers to Reach Goals --  Barriers/Prognosis Comment -- CHL IP DIET RECOMMENDATION 07/24/2018 SLP Diet Recommendations -- Liquid Administration via -- Medication Administration -- Compensations Slow rate;Small sips/bites;Follow solids with liquid Postural Changes --   CHL IP OTHER RECOMMENDATIONS 07/17/2018 Recommended Consults Consider esophageal assessment Oral Care Recommendations -- Other Recommendations --   CHL IP FOLLOW UP RECOMMENDATIONS 07/24/2018 Follow up Recommendations Skilled Nursing facility   [facility] IP FREQUENCY AND DURATION 07/17/2018 Speech Therapy Frequency (ACUTE ONLY) min 3x week Treatment Duration 1 week      CHL IP ORAL PHASE 01/10/2019 Oral Phase Impaired Oral - Pudding Teaspoon -- Oral - Pudding Cup -- Oral - Honey Teaspoon -- Oral - Honey Cup -- Oral - Nectar Teaspoon -- Oral - Nectar Cup Decreased bolus cohesion;Other (Comment) Oral - Nectar Straw -- Oral - Thin Teaspoon -- Oral - Thin Cup Decreased bolus cohesion;Lingual/palatal residue Oral - Thin Straw Decreased bolus cohesion Oral - Puree -- Oral - Mech Soft -- Oral - Regular (No Data) Oral - Multi-Consistency -- Oral - Pill -- Oral Phase - Comment --  CHL IP PHARYNGEAL PHASE 01/10/2019 Pharyngeal Phase Impaired Pharyngeal- Pudding Teaspoon -- Pharyngeal -- Pharyngeal- Pudding Cup -- Pharyngeal -- Pharyngeal- Honey Teaspoon -- Pharyngeal -- Pharyngeal- Honey Cup -- Pharyngeal -- Pharyngeal- Nectar Teaspoon -- Pharyngeal -- Pharyngeal- Nectar Cup Pharyngeal residue - valleculae Pharyngeal -- Pharyngeal- Nectar Straw -- Pharyngeal -- Pharyngeal- Thin Teaspoon -- Pharyngeal -- Pharyngeal- Thin Cup Pharyngeal residue - valleculae Pharyngeal -- Pharyngeal- Thin Straw Pharyngeal residue - valleculae Pharyngeal -- Pharyngeal- Puree -- Pharyngeal -- Pharyngeal- Mechanical Soft -- Pharyngeal -- Pharyngeal- Regular (No Data) Pharyngeal -- Pharyngeal- Multi-consistency -- Pharyngeal -- Pharyngeal- Pill -- Pharyngeal -- Pharyngeal Comment --  CHL IP CERVICAL ESOPHAGEAL PHASE  01/10/2019 Cervical Esophageal Phase (No Data) Pudding Teaspoon -- Pudding Cup -- Honey Teaspoon -- Honey Cup -- Nectar Teaspoon -- Nectar Cup -- Nectar Straw -- Thin Teaspoon -- Thin Cup -- Thin Straw -- Puree -- Mechanical Soft -- Regular -- Multi-consistency -- Pill -- Cervical Esophageal Comment -- [place] Siren 01/10/2019, 4:03 PM Lattie Haw  Jannifer [employer] Pager [phone] Office [phone]              Vas US Carotid (at [place] Only)  Result Date: 01/10/2019 Carotid Arterial Duplex Study Indications: CVA. Performing Technologist: June Leap RDMS, RVT  Examination Guidelines: A complete evaluation includes B-mode imaging, spectral Doppler, color Doppler, and power Doppler as needed of all accessible portions of each vessel. Bilateral testing is considered an integral part of a complete examination. Limited examinations for reoccurring indications may be performed as noted.  Right Carotid Findings: +----------+--------+--------+--------+------------+--------+           PSV cm/sEDV cm/sStenosisDescribe    Comments +----------+--------+--------+--------+------------+--------+ CCA Prox  114     21                                   +----------+--------+--------+--------+------------+--------+ CCA Distal75      19                                   +----------+--------+--------+--------+------------+--------+ ICA Prox  74      28      1-39%   heterogenous         +----------+--------+--------+--------+------------+--------+ ICA Distal116     33                                   +----------+--------+--------+--------+------------+--------+ ECA       112     15                                   +----------+--------+--------+--------+------------+--------+ +---------+--------+--+--------+-+---------+ VertebralPSV cm/s20EDV cm/s6Antegrade +---------+--------+--+--------+-+---------+  Left Carotid Findings:  +----------+--------+--------+--------+------------+--------+           PSV cm/sEDV cm/sStenosisDescribe    Comments +----------+--------+--------+--------+------------+--------+ CCA Prox  95      10                                   +----------+--------+--------+--------+------------+--------+ CCA Distal78      7                                    +----------+--------+--------+--------+------------+--------+ ICA Prox  61      23      1-39%   heterogenous         +----------+--------+--------+--------+------------+--------+ ICA Distal77      22                                   +----------+--------+--------+--------+------------+--------+ ECA       71      12                                   +----------+--------+--------+--------+------------+--------+ +---------+--------+--+--------+-+---------+ VertebralPSV cm/s33EDV cm/s4Antegrade +---------+--------+--+--------+-+---------+  Summary: Right Carotid: Velocities in the right ICA are consistent with a 1-39% stenosis. Left Carotid: Velocities in the left ICA are consistent with a 1-39% stenosis. Vertebrals: Bilateral vertebral arteries demonstrate antegrade flow. *See  table(s) above for measurements and observations.  Electronically signed by Antony Contras MD on 01/10/2019 at 12:37:16 PM.   Final     PHYSICAL EXAM: Pleasant elderly male currently not in distress. . Afebrile. Head is nontraumatic. Neck is supple without bruit.    Cardiac exam no murmur or gallop. Lungs are clear to auscultation. Distal pulses are well felt. Neurological Exam :  Awake alert.  Oriented x2.  Diminished attention, registration and recall.  Easily distractible.  Slightly confused.  No aphasia or apraxia.  Follows simple midline and one-step commands.  Extraocular movements are full range without nystagmus.  Right pupil is postsurgical large and minimally reactive.  Left pupil is 4 mm and reactive.  Vision acuity is poor in the right  eye.  Face is symmetric.  Tongue midline.  Hearing diminished bilaterally.  Motor system exam reveals no upper or lower extremity drift but moves lower extremities less than upper extremities.  There is antigravity strength in the lower extremities but not very cooperative for detailed testing.  No focal weakness.  Sensation appears intact.  Reflexes are symmetric.  Plantars downgoing.  Gait not tested.  ASSESSMENT/PLAN Mr. AJANI RINEER is a 78 y.o. male with history of CVA, SZ, COPD, GERD, HTN, HLD, GERD, smoking, ETOH use presenting after 2 falls with confusion.   Stroke:   Incidental R caudate infarct not related to presenting symptoms, secondary to small vessel disease    CXR NAD  CT head chronic small vessel disease  & atrophy. No acute stroke.  CT CS multilevel degenerative changes  MRI  R caudate infarct. Mod Small vessel disease. Atrophy.   CTA head & neck pending  Carotid Doppler  B ICA 1-39% stenosis, VAs antegrade   2D Echo pending   LDL 69  HgbA1c 4.7  SCDs for VTE prophylaxis  aspirin 81 mg daily prior to admission, now on aspirin 325 mg daily. Continue aspirin 325 mg alone at d/c  Therapy recommendations:  CIR Disposition: SNF Hypertension  Elevated > 200 on arrival in ED  Stable . Ok for BP goal normotensive  Hyperlipidemia  Home meds:  Lopid 600  LDL 69, at goal < 70  Other Stroke Risk Factors  Advanced age  Cigarette smoker, advised to stop smoking  ETOH use, advised to drink no more than 2 drink(s) a day. On CIWA protocol  Hx stroke/TIA - Reported hx of strokes but all MRIs in system negative  Other Active Problems  Metabolic encephalopathy w/ confusion in an alcoholic male w/ hx seizures. Likely Baseline dementia  Seizure d/o on keppra. EEG normal  Fever on Vanc and zosyn. Now afebrile. WBC 13.9. lactic acid 2.7  COPD/smoker  Elevated troponin 0.03   NOTHING FURTHER TO ADD FROM THE STROKE STANDPOINT  Patient has a 10-15% risk of  having another stroke over the next year, the highest risk is within 2 weeks of the most recent stroke/TIA (risk of having a stroke following a stroke or TIA is the same).  Ongoing risk factor control by Primary Care Physician  Stroke Service will sign off. Please call should any needs arise.  Follow-up Stroke Clinic at [facility] in 4 weeks, order placed.  Hospital day # 1  Continue aspirin for stroke prevention.  Aggressive risk factor modification.  Stroke team will sign off.  Kindly call for questions.  Antony Contras, MD Medical Director [facility] Pager: [phone] 01/11/2019 1:55 PM   To contact Stroke Continuity provider, please refer to  [email or web address] After hours, contact General Neurology

## 2019-01-12 LAB — CBC
HCT: 36 % — ABNORMAL LOW (ref 39.0–52.0)
HCT: 41.2 % (ref 39.0–52.0)
Hemoglobin: 12 g/dL — ABNORMAL LOW (ref 13.0–17.0)
Hemoglobin: 13.7 g/dL (ref 13.0–17.0)
MCH: 31.9 pg (ref 26.0–34.0)
MCH: 32.1 pg (ref 26.0–34.0)
MCHC: 33.3 g/dL (ref 30.0–36.0)
MCHC: 33.3 g/dL (ref 30.0–36.0)
MCV: 95.7 fL (ref 80.0–100.0)
MCV: 96.5 fL (ref 80.0–100.0)
Platelets: 182 10*3/uL (ref 150–400)
Platelets: 203 10*3/uL (ref 150–400)
RBC: 3.76 MIL/uL — ABNORMAL LOW (ref 4.22–5.81)
RBC: 4.27 MIL/uL (ref 4.22–5.81)
RDW: 13.4 % (ref 11.5–15.5)
RDW: 13.7 % (ref 11.5–15.5)
WBC: 8.7 10*3/uL (ref 4.0–10.5)
WBC: 9.4 10*3/uL (ref 4.0–10.5)
nRBC: 0 % (ref 0.0–0.2)
nRBC: 0 % (ref 0.0–0.2)

## 2019-01-12 LAB — VITAMIN B1: Vitamin B1 (Thiamine): 172.2 nmol/L (ref 66.5–200.0)

## 2019-01-12 LAB — LEVETIRACETAM LEVEL: Levetiracetam Lvl: <1 ug/mL — ABNORMAL LOW (ref 10.0–40.0)

## 2019-01-12 NOTE — Progress Notes (Signed)
  Speech Language Pathology Treatment: Dysphagia  Patient Details Name: Blake [name] MRN: [id number] DOB: Mar 28, 1941 Today's Date: 01/12/2019 Time: 1025-1050 SLP Time Calculation (min) (ACUTE ONLY): 25 min  Assessment / Plan / Recommendation Clinical Impression  Patient seen to address dysphagia goals with Dysphagia 3 mixed (fruit cup with syrup) and thin liquids. (patient currently on Dys 2, thin liquids). Patient exhibited prolonged mastication and exhibited two instances of delayed, dry throat clearing at beginning of PO intake, but did not exhibit any other overt s/s of aspiration or penetration and was able to self-feed with only setup assistance from SLP. Patient's vocal quality remained clear throughout.Patient consumed thin liquids via straw sips without overt s/s of aspiration or penetration observed.   If patient is not able to be upgraded with solids during acute rehab stay, recommend addressing this at SNF with SLP intervention.    HPI HPI: Blake [name] is a 78 y.o. male with with history of stroke seizures, alcohol abuse, COPD was brought to the ER after patient's friend called EMS since patient had 2 falls over the last 24 hours with confusion. CT head was unremarkable. COVID-19 test was negative. MRI 8 mm acute/early subacute infarction within the right caudate head. No hemorrhage or mass effect, moderate chronic microvascular ischemic changes and volume loss of the brain. Barium esophagram 07/19/18 distal esophageal stricture extending for a length of approximately 17 mm, narrowing the esophagus to approximately 6-7 mm. This has the appearance of a benign stricture, significant esophageal dysmotility with tertiary contractions and barium stasis. MBS 07/15/18 primary esophageal dysphagia, backflow from esophagus into pharynx, residue throughout esophagus. Flash penetration of thin. CXR No acute abnormality noted.       SLP Plan  Continue with current plan of care        Recommendations  Diet recommendations: Dysphagia 2 (fine chop);Thin liquid Liquids provided via: Straw;Cup Medication Administration: Whole meds with puree Supervision: Patient able to self feed;Intermittent supervision to cue for compensatory strategies Compensations: Minimize environmental distractions;Slow rate;Small sips/bites Postural Changes and/or Swallow Maneuvers: Seated upright 90 degrees                Oral Care Recommendations: Oral care QID Follow up Recommendations: Skilled Nursing facility SLP Visit Diagnosis: Dysphagia, oropharyngeal phase (R13.12) Plan: Continue with current plan of care       GO                Dannial Monarch 01/12/2019, 10:55 AM    Sonia Baller, MA, CCC-SLP Speech Therapy [facility] Acute [facility] Pager: [phone]

## 2019-01-12 NOTE — Progress Notes (Signed)
Occupational Therapy Treatment Patient Details Name: Blake [name] MRN: [id number] DOB: 02/03/1941 Today's Date: 01/12/2019    History of present illness Pt is a 78 y.o. male admitted 01/09/19 with AMS after two recent falls at home. ETOH (-). COVID (-). MRI shows acute/early subacute infarct in R caudate; no hemorrhage or mass effect. Other imaging negative for acute abnormality. Per neurology, the R caudate head stroke appears too small to fully account for signs/symptoms; DDx also includes acute Wernicke's encephalopathy. PMH includes stroke, ETOH abuse, COPD, HTN.   OT comments  This 78 yo male admitted with above presents to acute OT today making progress with overall mobility and self care (grooming and LBD). He will continue to benefit from acute OT with follow up OT at SNF to work towards a more independent level.  Follow Up Recommendations  SNF;Supervision/Assistance - 24 hour    Equipment Recommendations  Other (comment)(TBD next venue)       Precautions / Restrictions Precautions Precautions: Fall Precaution Comments: impulsive; pulled out IV line Restrictions Weight Bearing Restrictions: No       Mobility Bed Mobility               General bed mobility comments: pt sitting EOB with multiple staff members present upon arrival  Transfers Overall transfer level: Needs assistance Equipment used: Rolling walker (2 wheeled) Transfers: Sit to/from Stand Sit to Stand: Min assist;+2 safety/equipment         General transfer comment: min A for stability and safe; pt performing sit<>stand from EOB x2    Balance Overall balance assessment: Needs assistance Sitting-balance support: No upper extremity supported;Feet supported Sitting balance-Leahy Scale: Fair     Standing balance support: During functional activity;No upper extremity supported;Single extremity supported;Bilateral upper extremity supported Standing balance-Leahy Scale: Poor                              ADL either performed or assessed with clinical judgement   ADL Overall ADL's : Needs assistance/impaired     Grooming: Wash/dry hands;Wash/dry face;Min guard;Standing Grooming Details (indicate cue type and reason): comb beard     Lower Body Bathing: Moderate assistance Lower Body Bathing Details (indicate cue type and reason): min A sit<>stand     Lower Body Dressing: Minimal assistance;Sit to/from stand   Toilet Transfer: Minimal assistance;Ambulation   Toileting- Clothing Manipulation and Hygiene: Moderate assistance Toileting - Clothing Manipulation Details (indicate cue type and reason): min A sit<>stand             Vision Patient Visual Report: No change from baseline            Cognition Arousal/Alertness: Awake/alert Behavior During Therapy: Impulsive Overall Cognitive Status: Impaired/Different from baseline Area of Impairment: Orientation;Memory;Following commands;Safety/judgement;Awareness;Problem solving                 Orientation Level: Disoriented to;Situation   Memory: Decreased Boord-term memory;Decreased recall of precautions Following Commands: Follows one step commands with increased time Safety/Judgement: Decreased awareness of deficits;Decreased awareness of safety Awareness: Intellectual Problem Solving: Difficulty sequencing;Requires verbal cues;Requires tactile cues                     Pertinent Vitals/ Pain       Pain Assessment: No/denies pain     Prior Functioning/Environment              Frequency  Min 2X/week        Progress  Toward Goals  OT Goals(current goals can now be found in the care plan section)  Progress towards OT goals: Progressing toward goals     Plan Discharge plan remains appropriate    Co-evaluation    PT/OT/SLP Co-Evaluation/Treatment: Yes Reason for Co-Treatment: For patient/therapist safety;Necessary to address cognition/behavior during functional activity;To  address functional/ADL transfers PT goals addressed during session: Mobility/safety with mobility;Proper use of DME;Strengthening/ROM OT goals addressed during session: ADL's and self-care;Strengthening/ROM      AM-PAC OT "6 Clicks" Daily Activity     Outcome Measure   Help from another person eating meals?: None Help from another person taking care of personal grooming?: A Little Help from another person toileting, which includes using toliet, bedpan, or urinal?: A Lot Help from another person bathing (including washing, rinsing, drying)?: A Little Help from another person to put on and taking off regular upper body clothing?: A Little Help from another person to put on and taking off regular lower body clothing?: A Little 6 Click Score: 18    End of Session Equipment Utilized During Treatment: Gait belt;Rolling walker  OT Visit Diagnosis: Unsteadiness on feet (R26.81);Other symptoms and signs involving cognitive function   Activity Tolerance Patient tolerated treatment well   Patient Left in chair;with call bell/phone within reach;with chair alarm set   Nurse Communication          Time: [phone] OT Time Calculation (min): 32 min  Charges: OT General Charges $OT Visit: 1 Visit OT Treatments $Self Care/Home Management : 8-22 mins  [address], OTR/L Acute [employer] Pager [phone] Office [phone]      Almon Register 01/12/2019, 4:38 PM

## 2019-01-12 NOTE — Plan of Care (Signed)
  Problem: Education: Goal: Knowledge of General Education information will improve Description Including pain rating scale, medication(s)/side effects and non-pharmacologic comfort measures Outcome: Progressing   Problem: Health Behavior/Discharge Planning: Goal: Ability to manage health-related needs will improve Outcome: Progressing   Problem: Clinical Measurements: Goal: Ability to maintain clinical measurements within normal limits will improve Outcome: Progressing   Problem: Activity: Goal: Risk for activity intolerance will decrease Outcome: Progressing   Problem: Coping: Goal: Level of anxiety will decrease Outcome: Progressing   Problem: Pain Managment: Goal: General experience of comfort will improve Outcome: Progressing   Problem: Safety: Goal: Ability to remain free from injury will improve Outcome: Progressing   Problem: Skin Integrity: Goal: Risk for impaired skin integrity will decrease Outcome: Progressing   Problem: Education: Goal: Knowledge of disease or condition will improve Outcome: Progressing Goal: Knowledge of secondary prevention will improve Outcome: Progressing Goal: Knowledge of patient specific risk factors addressed and post discharge goals established will improve Outcome: Progressing   Problem: Coping: Goal: Will verbalize positive feelings about self Outcome: Progressing   Problem: Health Behavior/Discharge Planning: Goal: Ability to manage health-related needs will improve Outcome: Progressing   Problem: Self-Care: Goal: Ability to participate in self-care as condition permits will improve Outcome: Progressing   Problem: Nutrition: Goal: Dietary intake will improve Outcome: Progressing   Problem: Ischemic Stroke/TIA Tissue Perfusion: Goal: Complications of ischemic stroke/TIA will be minimized Outcome: Progressing

## 2019-01-12 NOTE — Progress Notes (Signed)
Physical Therapy Treatment Patient Details Name: Blake [name] MRN: [id number] DOB: 1941-04-10 Today's Date: 01/12/2019    History of Present Illness Pt is a 78 y.o. male admitted 01/09/19 with AMS after two recent falls at home. ETOH (-). COVID (-). MRI shows acute/early subacute infarct in R caudate; no hemorrhage or mass effect. Other imaging negative for acute abnormality. Per neurology, the R caudate head stroke appears too small to fully account for signs/symptoms; DDx also includes acute Wernicke's encephalopathy. PMH includes stroke, ETOH abuse, COPD, HTN.    PT Comments    Pt making fair progress with functional mobility. He continues to demonstrate very poor safety awareness and impulsiveness, requiring constant min-mod A for mobility. Pt would continue to benefit from skilled physical therapy services at this time while admitted and after d/c to address the below listed limitations in order to improve overall safety and independence with functional mobility.    Follow Up Recommendations  SNF     Equipment Recommendations  None recommended by PT    Recommendations for Other Services       Precautions / Restrictions Precautions Precautions: Fall Precaution Comments: impulsive; pulled out IV line Restrictions Weight Bearing Restrictions: No    Mobility  Bed Mobility               General bed mobility comments: pt sitting EOB with multiple staff members present upon arrival  Transfers Overall transfer level: Needs assistance Equipment used: Rolling walker (2 wheeled) Transfers: Sit to/from Stand Sit to Stand: Min assist;+2 safety/equipment         General transfer comment: min A for stability and safe; pt performing sit<>stand from EOB x2  Ambulation/Gait Ambulation/Gait assistance: Min assist;Mod assist Gait Distance (Feet): 100 Feet Assistive device: None;Rolling walker (2 wheeled) Gait Pattern/deviations: Step-through pattern;Decreased stride  length;Drifts right/left Gait velocity: decreased   General Gait Details: pt with modest instability requiring constant min-mod A and multimodal cueing to maintain attention to task. Pt with very poor safety awareness and insight into deficits   Stairs             Wheelchair Mobility    Modified Rankin (Stroke Patients Only)       Balance Overall balance assessment: Needs assistance Sitting-balance support: No upper extremity supported;Feet supported Sitting balance-Leahy Scale: Fair     Standing balance support: During functional activity;No upper extremity supported;Single extremity supported;Bilateral upper extremity supported Standing balance-Leahy Scale: Poor                              Cognition Arousal/Alertness: Awake/alert Behavior During Therapy: Impulsive Overall Cognitive Status: Impaired/Different from baseline Area of Impairment: Orientation;Memory;Following commands;Safety/judgement;Awareness;Problem solving                 Orientation Level: Disoriented to;Situation   Memory: Decreased Dehnert-term memory;Decreased recall of precautions Following Commands: Follows one step commands with increased time Safety/Judgement: Decreased awareness of deficits;Decreased awareness of safety Awareness: Intellectual Problem Solving: Difficulty sequencing;Requires verbal cues;Requires tactile cues        Exercises      General Comments        Pertinent Vitals/Pain Pain Assessment: No/denies pain    Home Living                      Prior Function            PT Goals (current goals can now be found in the care plan  section) Acute Rehab PT Goals PT Goal Formulation: Patient unable to participate in goal setting Time For Goal Achievement: 01/25/19 Potential to Achieve Goals: Good Progress towards PT goals: Progressing toward goals    Frequency    Min 3X/week      PT Plan Current plan remains appropriate     Co-evaluation PT/OT/SLP Co-Evaluation/Treatment: Yes Reason for Co-Treatment: For patient/therapist safety;To address functional/ADL transfers;Necessary to address cognition/behavior during functional activity PT goals addressed during session: Mobility/safety with mobility;Proper use of DME;Balance;Strengthening/ROM        AM-PAC PT "6 Clicks" Mobility   Outcome Measure  Help needed turning from your back to your side while in a flat bed without using bedrails?: None Help needed moving from lying on your back to sitting on the side of a flat bed without using bedrails?: None Help needed moving to and from a bed to a chair (including a wheelchair)?: A Lot Help needed standing up from a chair using your arms (e.g., wheelchair or bedside chair)?: A Little Help needed to walk in hospital room?: A Lot Help needed climbing 3-5 steps with a railing? : Total 6 Click Score: 16    End of Session Equipment Utilized During Treatment: Gait belt Activity Tolerance: Patient tolerated treatment well Patient left: in chair;with call bell/phone within reach;with chair alarm set Nurse Communication: Mobility status PT Visit Diagnosis: Other abnormalities of gait and mobility (R26.89)     Time: 6945-0388 PT Time Calculation (min) (ACUTE ONLY): 33 min  Charges:  $Gait Training: 8-22 mins                     Sherie Don, [place], DPT  Acute Rehabilitation Services Pager [phone] Office [place] 01/12/2019, 4:12 PM

## 2019-01-12 NOTE — Progress Notes (Signed)
STROKE TEAM PROGRESS NOTE   INTERVAL HISTORY Patient is doing well .  Echocardiogram was unremarkable.  Carotid ultrasound showed no significant stenosis.  EEG was normal.  Neurologically stable.  No changes  Vitals:   01/11/19 2300 01/12/19 0429 01/12/19 0742 01/12/19 1219  BP: (!) 156/75 (!) 113/52 (!) 141/75 138/80  Pulse: 61 (!) 48 (!) 54 (!) 58  Resp: 18 16 15 17   Temp: 98.5 F (36.9 C) 98.8 F (37.1 C) 98.4 F (36.9 C) 97.7 F (36.5 C)  TempSrc: Oral Axillary Oral Oral  SpO2: 93% 96% 97% 98%  Weight:      Height:        CBC:  Recent Labs  Lab 01/09/19 2218  01/10/19 0433 01/12/19 0346  WBC 16.8*  --  13.9* 8.7  NEUTROABS 13.7*  --  10.6*  --   HGB 15.4   < > 13.7 12.0*  HCT 46.7   < > 39.8 36.0*  MCV 96.3  --  96.4 95.7  PLT 237  --  197 182   < > = values in this interval not displayed.    Basic Metabolic Panel:  Recent Labs  Lab 01/09/19 2218 01/10/19 0022 01/10/19 0433  NA 136 135 135  K 4.7 4.1 4.4  CL 104  --  104  CO2 18*  --  18*  GLUCOSE 92  --  90  BUN 15  --  16  CREATININE 1.16  --  1.09  CALCIUM 9.6  --  9.1   Lipid Panel:     Component Value Date/Time   CHOL 131 01/10/2019 0433   TRIG 66 01/10/2019 0433   HDL 49 01/10/2019 0433   CHOLHDL 2.7 01/10/2019 0433   VLDL 13 01/10/2019 0433   LDLCALC 69 01/10/2019 0433   HgbA1c:  Lab Results  Component Value Date   HGBA1C 4.7 (L) 01/10/2019   Urine Drug Screen:     Component Value Date/Time   LABOPIA NONE DETECTED 01/10/2019 0024   COCAINSCRNUR NONE DETECTED 01/10/2019 0024   LABBENZ NONE DETECTED 01/10/2019 0024   AMPHETMU NONE DETECTED 01/10/2019 0024   THCU NONE DETECTED 01/10/2019 0024   LABBARB NONE DETECTED 01/10/2019 0024    Alcohol Level     Component Value Date/Time   ETH <10 01/09/2019 2218    IMAGING Ct Angio Head W Or Wo Contrast  Result Date: 01/10/2019 CLINICAL DATA:  Stroke follow-up EXAM: CT ANGIOGRAPHY HEAD AND NECK TECHNIQUE: Multidetector CT imaging of  the head and neck was performed using the standard protocol during bolus administration of intravenous contrast. Multiplanar CT image reconstructions and MIPs were obtained to evaluate the vascular anatomy. Carotid stenosis measurements (when applicable) are obtained utilizing NASCET criteria, using the distal internal carotid diameter as the denominator. CONTRAST:  37mL OMNIPAQUE IOHEXOL 350 MG/ML SOLN COMPARISON:  Head CT 01/09/2019 FINDINGS: CTA NECK FINDINGS SKELETON: There is no bony spinal canal stenosis. No lytic or blastic lesion. OTHER NECK: Normal pharynx, larynx and major salivary glands. No cervical lymphadenopathy. Unremarkable thyroid gland. UPPER CHEST: Biapical emphysema. AORTIC ARCH: There is mild calcific atherosclerosis of the aortic arch. There is no aneurysm, dissection or hemodynamically significant stenosis of the visualized ascending aorta and aortic arch. Conventional 3 vessel aortic branching pattern. The visualized proximal subclavian arteries are widely patent. RIGHT CAROTID SYSTEM: --Common carotid artery: Widely patent origin without common carotid artery dissection or aneurysm. --Internal carotid artery: No dissection, occlusion or aneurysm. There is mixed density atherosclerosis extending into the proximal ICA,  resulting in less than 50% stenosis. --External carotid artery: No acute abnormality. LEFT CAROTID SYSTEM: --Common carotid artery: Widely patent origin without common carotid artery dissection or aneurysm. --Internal carotid artery: No dissection, occlusion or aneurysm. Mild atherosclerotic calcification at the carotid bifurcation without hemodynamically significant stenosis. --External carotid artery: No acute abnormality. VERTEBRAL ARTERIES: Right dominant configuration. Both origins are normal. No dissection, occlusion or flow-limiting stenosis to the vertebrobasilar confluence. CTA HEAD FINDINGS POSTERIOR CIRCULATION: --Vertebral arteries: Normal codominant configuration  of V4 segments. --Posterior inferior cerebellar arteries (PICA): Both originate from the right vertebral artery. --Anterior inferior cerebellar arteries (AICA): Normal left AICA. Right AICA originates from the superior cerebellar artery. --Basilar artery: Normal. --Superior cerebellar arteries: Normal. --Posterior cerebral arteries (PCA): There is a fetal origin of the right PCA. There is multifocal moderate right PCA P2 segment stenosis. The left PCA is occluded at the proximal P2 segment. ANTERIOR CIRCULATION: --Intracranial internal carotid arteries: Atherosclerotic calcification of the internal carotid arteries at the skull base without hemodynamically significant stenosis. --Anterior cerebral arteries (ACA): Normal. Both A1 segments are present. Patent anterior communicating artery (a-comm). --Middle cerebral arteries (MCA): Normal. VENOUS SINUSES: As permitted by contrast timing, patent. ANATOMIC VARIANTS: None Review of the MIP images confirms the above findings. IMPRESSION: 1. No emergent large vessel occlusion. 2. Occlusion of the left PCA at the proximal P2 segment. 3. Multifocal moderate right PCA P2 segment stenosis. 4. Bilateral carotid bifurcation atherosclerosis without hemodynamically significant stenosis by NASCET criteria. Electronically Signed   By: Ulyses Jarred M.D.   On: 01/10/2019 15:24   Ct Angio Neck W Or Wo Contrast  Result Date: 01/10/2019 CLINICAL DATA:  Stroke follow-up EXAM: CT ANGIOGRAPHY HEAD AND NECK TECHNIQUE: Multidetector CT imaging of the head and neck was performed using the standard protocol during bolus administration of intravenous contrast. Multiplanar CT image reconstructions and MIPs were obtained to evaluate the vascular anatomy. Carotid stenosis measurements (when applicable) are obtained utilizing NASCET criteria, using the distal internal carotid diameter as the denominator. CONTRAST:  18mL OMNIPAQUE IOHEXOL 350 MG/ML SOLN COMPARISON:  Head CT 01/09/2019 FINDINGS:  CTA NECK FINDINGS SKELETON: There is no bony spinal canal stenosis. No lytic or blastic lesion. OTHER NECK: Normal pharynx, larynx and major salivary glands. No cervical lymphadenopathy. Unremarkable thyroid gland. UPPER CHEST: Biapical emphysema. AORTIC ARCH: There is mild calcific atherosclerosis of the aortic arch. There is no aneurysm, dissection or hemodynamically significant stenosis of the visualized ascending aorta and aortic arch. Conventional 3 vessel aortic branching pattern. The visualized proximal subclavian arteries are widely patent. RIGHT CAROTID SYSTEM: --Common carotid artery: Widely patent origin without common carotid artery dissection or aneurysm. --Internal carotid artery: No dissection, occlusion or aneurysm. There is mixed density atherosclerosis extending into the proximal ICA, resulting in less than 50% stenosis. --External carotid artery: No acute abnormality. LEFT CAROTID SYSTEM: --Common carotid artery: Widely patent origin without common carotid artery dissection or aneurysm. --Internal carotid artery: No dissection, occlusion or aneurysm. Mild atherosclerotic calcification at the carotid bifurcation without hemodynamically significant stenosis. --External carotid artery: No acute abnormality. VERTEBRAL ARTERIES: Right dominant configuration. Both origins are normal. No dissection, occlusion or flow-limiting stenosis to the vertebrobasilar confluence. CTA HEAD FINDINGS POSTERIOR CIRCULATION: --Vertebral arteries: Normal codominant configuration of V4 segments. --Posterior inferior cerebellar arteries (PICA): Both originate from the right vertebral artery. --Anterior inferior cerebellar arteries (AICA): Normal left AICA. Right AICA originates from the superior cerebellar artery. --Basilar artery: Normal. --Superior cerebellar arteries: Normal. --Posterior cerebral arteries (PCA): There is a fetal origin of the  right PCA. There is multifocal moderate right PCA P2 segment stenosis. The  left PCA is occluded at the proximal P2 segment. ANTERIOR CIRCULATION: --Intracranial internal carotid arteries: Atherosclerotic calcification of the internal carotid arteries at the skull base without hemodynamically significant stenosis. --Anterior cerebral arteries (ACA): Normal. Both A1 segments are present. Patent anterior communicating artery (a-comm). --Middle cerebral arteries (MCA): Normal. VENOUS SINUSES: As permitted by contrast timing, patent. ANATOMIC VARIANTS: None Review of the MIP images confirms the above findings. IMPRESSION: 1. No emergent large vessel occlusion. 2. Occlusion of the left PCA at the proximal P2 segment. 3. Multifocal moderate right PCA P2 segment stenosis. 4. Bilateral carotid bifurcation atherosclerosis without hemodynamically significant stenosis by NASCET criteria. Electronically Signed   By: Ulyses Jarred M.D.   On: 01/10/2019 15:24   Dg Swallowing Func-speech Pathology  Result Date: 01/10/2019 Objective Swallowing Evaluation: Type of Study: MBS-Modified Barium Swallow Study  Patient Details Name: Blake [name] MRN: [id number] Date of Birth: 11/03/1940 Today's Date: 01/10/2019 Time: SLP Start Time (ACUTE ONLY): 1501 -SLP Stop Time (ACUTE ONLY): 1521 SLP Time Calculation (min) (ACUTE ONLY): 20 min Past Medical History: Past Medical History: Diagnosis Date . Chronic insomnia  . Cigarette smoker  . COPD (chronic obstructive pulmonary disease) ([place])  . Diverticulosis of colon  . Elevated prostate specific antigen (PSA)  . ETOH abuse  . GERD (gastroesophageal reflux disease)  . Hx of colonic polyps  . Hyperlipidemia  . Hypertension  . Pulmonary nodule  . Shortness of breath  . Stroke [facility])  Past Surgical History: Past Surgical History: Procedure Laterality Date . COLONOSCOPY   . ESOPHAGOGASTRODUODENOSCOPY (EGD) WITH PROPOFOL N/A 07/21/2018  Procedure: ESOPHAGOGASTRODUODENOSCOPY (EGD) WITH PROPOFOL;  Surgeon: Lavena Bullion, DO;  Location: [facility];  Service:  Gastroenterology;  Laterality: N/A; . needle biopsy RUL nodule  01/2009  benign . TONSILLECTOMY  1947 . VASECTOMY   HPI: CRANSTON KOORS is a 78 y.o. male with with history of stroke seizures, alcohol abuse, COPD was brought to the ER after patient's friend called EMS since patient had 2 falls over the last 24 hours with confusion. CT head was unremarkable. COVID-19 test was negative. MRI 8 mm acute/early subacute infarction within the right caudate head. No hemorrhage or mass effect, moderate chronic microvascular ischemic changes and volume loss of the brain. Barium esophagram 07/19/18 distal esophageal stricture extending for a length of approximately 17 mm, narrowing the esophagus to approximately 6-7 mm. This has the appearance of a benign stricture, significant esophageal dysmotility with tertiary contractions and barium stasis. MBS 07/15/18 primary esophageal dysphagia, backflow from esophagus into pharynx, residue throughout esophagus. Flash penetration of thin. CXR No acute abnormality noted.  No data recorded Assessment / Plan / Recommendation CHL IP CLINICAL IMPRESSIONS 01/10/2019 Clinical Impression Pt demonstrates min-mild oropharyngeal dysphagia (oral > pharyngeal) and evidence of previously diagnosed esophageal dysphagia. Difficulty gathering and containing bolus in a cupped manner resulting in sublingual spill with thin and nectar. Mild lingual residue, sensed with subswallow to clear. Overall, pharyngeal phase was within functional limits. There was minimal residue in vallecular that did not increase as study progressed. Laryngeal elevation and epiglottic deflection was functional to prevent any penetration or aspiration during studys. Video image of esophagus showed slow transit and periods of widened then very narrow places along esophagus. Recommend Dys 2, thin liquids, straws allowed, remain upright 45 min after meals, small sips and pills whole in applesauce. ST will continue to follow.    SLP  Visit Diagnosis Dysphagia, unspecified (  R13.10) Attention and concentration deficit following -- Frontal lobe and executive function deficit following -- Impact on safety and function Moderate aspiration risk   CHL IP TREATMENT RECOMMENDATION 01/10/2019 Treatment Recommendations Defer until completion of intrumental exam   Prognosis 07/17/2018 Prognosis for Safe Diet Advancement Good Barriers to Reach Goals -- Barriers/Prognosis Comment -- CHL IP DIET RECOMMENDATION 07/24/2018 SLP Diet Recommendations -- Liquid Administration via -- Medication Administration -- Compensations Slow rate;Small sips/bites;Follow solids with liquid Postural Changes --   CHL IP OTHER RECOMMENDATIONS 07/17/2018 Recommended Consults Consider esophageal assessment Oral Care Recommendations -- Other Recommendations --   CHL IP FOLLOW UP RECOMMENDATIONS 07/24/2018 Follow up Recommendations Skilled Nursing facility   [facility] IP FREQUENCY AND DURATION 07/17/2018 Speech Therapy Frequency (ACUTE ONLY) min 3x week Treatment Duration 1 week      CHL IP ORAL PHASE 01/10/2019 Oral Phase Impaired Oral - Pudding Teaspoon -- Oral - Pudding Cup -- Oral - Honey Teaspoon -- Oral - Honey Cup -- Oral - Nectar Teaspoon -- Oral - Nectar Cup Decreased bolus cohesion;Other (Comment) Oral - Nectar Straw -- Oral - Thin Teaspoon -- Oral - Thin Cup Decreased bolus cohesion;Lingual/palatal residue Oral - Thin Straw Decreased bolus cohesion Oral - Puree -- Oral - Mech Soft -- Oral - Regular (No Data) Oral - Multi-Consistency -- Oral - Pill -- Oral Phase - Comment --  CHL IP PHARYNGEAL PHASE 01/10/2019 Pharyngeal Phase Impaired Pharyngeal- Pudding Teaspoon -- Pharyngeal -- Pharyngeal- Pudding Cup -- Pharyngeal -- Pharyngeal- Honey Teaspoon -- Pharyngeal -- Pharyngeal- Honey Cup -- Pharyngeal -- Pharyngeal- Nectar Teaspoon -- Pharyngeal -- Pharyngeal- Nectar Cup Pharyngeal residue - valleculae Pharyngeal -- Pharyngeal- Nectar Straw -- Pharyngeal -- Pharyngeal- Thin Teaspoon --  Pharyngeal -- Pharyngeal- Thin Cup Pharyngeal residue - valleculae Pharyngeal -- Pharyngeal- Thin Straw Pharyngeal residue - valleculae Pharyngeal -- Pharyngeal- Puree -- Pharyngeal -- Pharyngeal- Mechanical Soft -- Pharyngeal -- Pharyngeal- Regular (No Data) Pharyngeal -- Pharyngeal- Multi-consistency -- Pharyngeal -- Pharyngeal- Pill -- Pharyngeal -- Pharyngeal Comment --  CHL IP CERVICAL ESOPHAGEAL PHASE 01/10/2019 Cervical Esophageal Phase (No Data) Pudding Teaspoon -- Pudding Cup -- Honey Teaspoon -- Honey Cup -- Nectar Teaspoon -- Nectar Cup -- Nectar Straw -- Thin Teaspoon -- Thin Cup -- Thin Straw -- Puree -- Mechanical Soft -- Regular -- Multi-consistency -- Pill -- Cervical Esophageal Comment -- [place] Siren 01/10/2019, 4:03 PM Orbie Pyo Litaker M.Ed Actor Pager [phone] Office [phone]               PHYSICAL EXAM: Pleasant elderly male currently not in distress. . Afebrile. Head is nontraumatic. Neck is supple without bruit.    Cardiac exam no murmur or gallop. Lungs are clear to auscultation. Distal pulses are well felt. Neurological Exam :  Awake alert.  Oriented x2.  Diminished attention, registration and recall.  Easily distractible.  Slightly confused.  No aphasia or apraxia.  Follows simple midline and one-step commands.  Extraocular movements are full range without nystagmus.  Right pupil is postsurgical large and minimally reactive.  Left pupil is 4 mm and reactive.  Vision acuity is poor in the right eye.  Face is symmetric.  Tongue midline.  Hearing diminished bilaterally.  Motor system exam reveals no upper or lower extremity drift but moves lower extremities less than upper extremities.  There is antigravity strength in the lower extremities but not very cooperative for detailed testing.  No focal weakness.  Sensation appears intact.  Reflexes are symmetric.  Plantars downgoing.  Gait not tested.  ASSESSMENT/PLAN Blake [name] is a 78 y.o.  male with history of CVA, SZ, COPD, GERD, HTN, HLD, GERD, smoking, ETOH use presenting after 2 falls with confusion.   Stroke:   Incidental R caudate infarct not related to presenting symptoms, secondary to small vessel disease    CXR NAD  CT head chronic small vessel disease  & atrophy. No acute stroke.  CT CS multilevel degenerative changes  MRI  R caudate infarct. Mod Small vessel disease. Atrophy.   CTA head & neck pending  Carotid Doppler  B ICA 1-39% stenosis, VAs antegrade   2D Echo ejection fraction 55 to 60%.  No cardiac source of embolism.  LDL 69  HgbA1c 4.7  SCDs for VTE prophylaxis  aspirin 81 mg daily prior to admission, now on aspirin 325 mg daily. Continue aspirin 325 mg alone at d/c  Therapy recommendations:  CIR Disposition: SNF Hypertension  Elevated > 200 on arrival in ED  Stable . Ok for BP goal normotensive  Hyperlipidemia  Home meds:  Lopid 600  LDL 69, at goal < 70  Other Stroke Risk Factors  Advanced age  Cigarette smoker, advised to stop smoking  ETOH use, advised to drink no more than 2 drink(s) a day. On CIWA protocol  Hx stroke/TIA - Reported hx of strokes but all MRIs in system negative  Other Active Problems  Metabolic encephalopathy w/ confusion in an alcoholic male w/ hx seizures. Likely Baseline dementia  Seizure d/o on keppra. EEG normal  Fever on Vanc and zosyn. Now afebrile. WBC 13.9. lactic acid 2.7  COPD/smoker  Elevated troponin 0.03   NOTHING FURTHER TO ADD FROM THE STROKE STANDPOINT  Patient has a 10-15% risk of having another stroke over the next year, the highest risk is within 2 weeks of the most recent stroke/TIA (risk of having a stroke following a stroke or TIA is the same).  Ongoing risk factor control by Primary Care Physician  Stroke Service will sign off. Please call should any needs arise.  Follow-up Stroke Clinic at [facility] in 4 weeks, order placed.  Hospital day #  2  Continue aspirin for stroke prevention.  Aggressive risk factor modification.  Stroke team will sign off.  Kindly call for questions.  Antony Contras, MD Medical Director [facility] Stroke Center Pager: [phone] 01/12/2019 1:39 PM   To contact Stroke Continuity provider, please refer to [email or web address] After hours, contact General Neurology

## 2019-01-12 NOTE — Progress Notes (Signed)
Patient Demographics:    Blake [name], is a 78 y.o. male, DOB - 1941-02-06, JYN:[id number]  Admit date - 01/09/2019   Admitting Physician Rise Patience, MD  Outpatient Primary MD for the patient is Patient, No Pcp Per  LOS - 2   Chief Complaint  Patient presents with   Fall        Subjective:    Blake [name] today has no fevers, no emesis,  No chest pain, remains intermittently confused, and forgetful, accidentally pulled IV out had some blood loss  Assessment  & Plan :    Principal Problem:   Acute CVA (cerebrovascular accident) ([place]) Active Problems:   ETOH abuse   Acute encephalopathy   Hypertension   Seizure disorder ([place])    Brain MRI IMPRESSION: 1) 8 mm acute/early subacute infarction within the right caudate head. No hemorrhage or mass effect. 2. Moderate chronic microvascular ischemic changes and volume loss of the brain.  Brief Summary 78 y.o.malewithwith history of previous stroke seizures alcohol abuse COPD admitted on 01/10/2019 with recurrent falls and confusion/agitation-COVID-19 test negative, MRI brain confirms right caudate stroke. As per neurologist MRI findings of small right caudate stroke may not fully explain patient's neuro symptoms,    A/p  1) acute infarction of the right caudate--- neuro consult appreciated,, echo with EF of 55 to 86%, diastolic dysfunction noted, no regional wall motion normalities, no intracardiac thrombus, carotid artery Dopplers without hemodynamically stenosis,, swallow eval appreciated,  continue aspirin 325 and Lipitor ... A1c is 4.7, LDL 69, HDL is 49,  appreciated, recommend inpatient rehab  2)Metabolic Encephalopathy with confusion in an alcoholic male--- ??? DTs... ?? SZ--currently on IV Keppra, use lorazepam as needed per CIWA protocol, Neurologist recommends high-dose thiamine IV at 500 mg 3 times daily x3 days then 250 mg  IV 3 times daily x3 days then 100 mg p.o. after that thiamine and folic acid--- EEG pending.... As per neurologist MRI findings of small right caudate stroke may not fully explain patient's neuro symptoms, indicates encephalopathy cannot be ruled out, alcohol withdrawal is another differential , as well as seizures  3)Fever + Leukocytosis---  fevers and leukocytosis-resolved, no further antibiotics indicated at this time , initially treated with IV Vanco and Zosyn  patient received Rocephin x1 dose on 01/11/2019  4)COPD/Smoker--- chest x-ray without acute pneumonia, no acute flareup, continue bronchodilators   5)H/o Sz--- EEG without acute findings, continue Keppra  6)Dysphagia----speech and swallow eval appreciated, do recommend dysphagia 2 diet with thin liquids may take pills whole in applesauce, If patient is not able to be upgraded with solids during acute rehab stay, recommend addressing this at SNF with SLP intervention.  7)Disposition--- PT recommends inpatient rehab  Disposition/Need for in-Hospital Stay- patient unable to be discharged at this time due to awaiting   possible transfer to inpatient rehab  Code Status : Full code  Family Communication:   na   Disposition Plan  : Possible transfer to inpatient rehab  Consults  : Neurology  DVT Prophylaxis  :  Lovenox -  Lab Results  Component Value Date   PLT 182 01/12/2019    Inpatient Medications  Scheduled Meds:   stroke: mapping our early stages of recovery book   Does not apply Once  aspirin  300 mg Rectal Daily   Or   aspirin  325 mg Oral Daily   chlorhexidine  15 mL Mouth Rinse BID   enoxaparin (LOVENOX) injection  40 mg Subcutaneous N23F   folic acid  1 mg Oral Daily   mouth rinse  15 mL Mouth Rinse q12n4p   multivitamin with minerals  1 tablet Oral Daily   [START ON 01/16/2019] thiamine  100 mg Oral Daily   Continuous Infusions:  cefTRIAXone (ROCEPHIN)  IV 2 g (01/11/19 1536)   levETIRAcetam  500 mg (01/12/19 0852)   thiamine injection     thiamine injection 500 mg (01/12/19 0916)   vancomycin 1,750 mg (01/11/19 1257)   PRN Meds:.acetaminophen **OR** acetaminophen (TYLENOL) oral liquid 160 mg/5 mL **OR** acetaminophen, LORazepam **OR** LORazepam    Anti-infectives (From admission, onward)   Start     Dose/Rate Route Frequency Ordered Stop   01/11/19 1500  cefTRIAXone (ROCEPHIN) 2 g in sodium chloride 0.9 % 100 mL IVPB     2 g 200 mL/hr over 30 Minutes Intravenous Every 24 hours 01/11/19 1435     01/11/19 1300  vancomycin (VANCOCIN) 1,750 mg in sodium chloride 0.9 % 500 mL IVPB     1,750 mg 250 mL/hr over 120 Minutes Intravenous Every 36 hours 01/10/19 0608     01/10/19 0615  piperacillin-tazobactam (ZOSYN) IVPB 3.375 g  Status:  Discontinued     3.375 g 12.5 mL/hr over 240 Minutes Intravenous Every 8 hours 01/10/19 0608 01/11/19 1435   01/10/19 0045  vancomycin (VANCOCIN) IVPB 1000 mg/200 mL premix     1,000 mg 200 mL/hr over 60 Minutes Intravenous  Once 01/10/19 0033 01/10/19 0215   01/10/19 0045  piperacillin-tazobactam (ZOSYN) IVPB 3.375 g     3.375 g 100 mL/hr over 30 Minutes Intravenous  Once 01/10/19 0033 01/10/19 0145        Objective:   Vitals:   01/11/19 2300 01/12/19 0429 01/12/19 0742 01/12/19 1219  BP: (!) 156/75 (!) 113/52 (!) 141/75 138/80  Pulse: 61 (!) 48 (!) 54 (!) 58  Resp: 18 16 15 17   Temp: 98.5 F (36.9 C) 98.8 F (37.1 C) 98.4 F (36.9 C) 97.7 F (36.5 C)  TempSrc: Oral Axillary Oral Oral  SpO2: 93% 96% 97% 98%  Weight:      Height:        Wt Readings from Last 3 Encounters:  01/10/19 68 kg  08/11/18 64.5 kg  07/25/18 64.8 kg     Intake/Output Summary (Last 24 hours) at 01/12/2019 1552 Last data filed at 01/12/2019 1500 Gross per 24 hour  Intake 2846 ml  Output 1350 ml  Net 1496 ml   Physical Exam Patient is examined daily including today on 01/12/19 , exams remain the same as of yesterday except that has changed    Gen:- Awake, confused and disoriented HEENT:- [place].AT, No sclera icterus Neck-Supple Neck,No JVD,.  Lungs-  CTAB , fair symmetrical air movement CV- S1, S2 normal, regular  Abd-  +ve B.Sounds, Abd Soft, No tenderness,    Extremity/Skin:- No  edema, pedal pulses present  Psych-affect is appropriate, forgetful and occasionally disoriented Neuro-generalized weakness , no new focal deficits, no tremors   Data Review:   Micro Results Recent Results (from the past 240 hour(s))  Blood culture (routine x 2)     Status: None (Preliminary result)   Collection Time: 01/10/19 12:35 AM  Result Value Ref Range Status   Specimen Description BLOOD RIGHT FOREARM  Final  Special Requests   Final    BOTTLES DRAWN AEROBIC AND ANAEROBIC Blood Culture results may not be optimal due to an excessive volume of blood received in culture bottles   Culture   Final    NO GROWTH 2 DAYS Performed at [facility], [place] [address] [place], [place] [address]    Report Status PENDING  Incomplete  Blood culture (routine x 2)     Status: None (Preliminary result)   Collection Time: 01/10/19  1:10 AM  Result Value Ref Range Status   Specimen Description BLOOD LEFT HAND  Final   Special Requests   Final    BOTTLES DRAWN AEROBIC AND ANAEROBIC Blood Culture adequate volume   Culture   Final    NO GROWTH 2 DAYS Performed at [facility], [place] [address] [place], [place] [address]    Report Status PENDING  Incomplete  SARS Coronavirus 2 [facility] order, Performed in [place] hospital lab)     Status: None   Collection Time: 01/10/19  1:47 AM  Result Value Ref Range Status   SARS Coronavirus 2 NEGATIVE NEGATIVE Final    Comment: (NOTE) If result is NEGATIVE SARS-CoV-2 target nucleic acids are NOT DETECTED. The SARS-CoV-2 RNA is generally detectable in upper and lower  respiratory specimens during the acute phase of infection. The lowest  concentration of SARS-CoV-2 viral copies this assay can  detect is 250  copies / mL. A negative result does not preclude SARS-CoV-2 infection  and should not be used as the sole basis for treatment or other  patient management decisions.  A negative result may occur with  improper specimen collection / handling, submission of specimen other  than nasopharyngeal swab, presence of viral mutation(s) within the  areas targeted by this assay, and inadequate number of viral copies  (<250 copies / mL). A negative result must be combined with clinical  observations, patient history, and epidemiological information. If result is POSITIVE SARS-CoV-2 target nucleic acids are DETECTED. The SARS-CoV-2 RNA is generally detectable in upper and lower  respiratory specimens dur ing the acute phase of infection.  Positive  results are indicative of active infection with SARS-CoV-2.  Clinical  correlation with patient history and other diagnostic information is  necessary to determine patient infection status.  Positive results do  not rule out bacterial infection or co-infection with other viruses. If result is PRESUMPTIVE POSTIVE SARS-CoV-2 nucleic acids MAY BE PRESENT.   A presumptive positive result was obtained on the submitted specimen  and confirmed on repeat testing.  While 2019 novel coronavirus  (SARS-CoV-2) nucleic acids may be present in the submitted sample  additional confirmatory testing may be necessary for epidemiological  and / or clinical management purposes  to differentiate between  SARS-CoV-2 and other Sarbecovirus currently known to infect humans.  If clinically indicated additional testing with an alternate test  methodology [id number]) is advised. The SARS-CoV-2 RNA is generally  detectable in upper and lower respiratory sp ecimens during the acute  phase of infection. The expected result is Negative. Fact Sheet for Patients:  StrictlyIdeas.no Fact Sheet for Healthcare  Providers: BankingDealers.co.za This test is not yet approved or cleared by the [place] FDA and has been authorized for detection and/or diagnosis of SARS-CoV-2 by FDA under an Emergency Use Authorization (EUA).  This EUA will remain in effect (meaning this test can be used) for the duration of the COVID-19 declaration under Section 564(b)(1) of the Act, 21 U.S.C. section 360bbb-3(b)(1), unless  the authorization is terminated or revoked sooner. Performed at [facility], [place] [address] [place], [place] [address]     Radiology Reports Ct Angio Head W Or Wo Contrast  Result Date: 01/10/2019 CLINICAL DATA:  Stroke follow-up EXAM: CT ANGIOGRAPHY HEAD AND NECK TECHNIQUE: Multidetector CT imaging of the head and neck was performed using the standard protocol during bolus administration of intravenous contrast. Multiplanar CT image reconstructions and MIPs were obtained to evaluate the vascular anatomy. Carotid stenosis measurements (when applicable) are obtained utilizing NASCET criteria, using the distal internal carotid diameter as the denominator. CONTRAST:  75mL OMNIPAQUE IOHEXOL 350 MG/ML SOLN COMPARISON:  Head CT 01/09/2019 FINDINGS: CTA NECK FINDINGS SKELETON: There is no bony spinal canal stenosis. No lytic or blastic lesion. OTHER NECK: Normal pharynx, larynx and major salivary glands. No cervical lymphadenopathy. Unremarkable thyroid gland. UPPER CHEST: Biapical emphysema. AORTIC ARCH: There is mild calcific atherosclerosis of the aortic arch. There is no aneurysm, dissection or hemodynamically significant stenosis of the visualized ascending aorta and aortic arch. Conventional 3 vessel aortic branching pattern. The visualized proximal subclavian arteries are widely patent. RIGHT CAROTID SYSTEM: --Common carotid artery: Widely patent origin without common carotid artery dissection or aneurysm. --Internal carotid artery: No dissection, occlusion or aneurysm.  There is mixed density atherosclerosis extending into the proximal ICA, resulting in less than 50% stenosis. --External carotid artery: No acute abnormality. LEFT CAROTID SYSTEM: --Common carotid artery: Widely patent origin without common carotid artery dissection or aneurysm. --Internal carotid artery: No dissection, occlusion or aneurysm. Mild atherosclerotic calcification at the carotid bifurcation without hemodynamically significant stenosis. --External carotid artery: No acute abnormality. VERTEBRAL ARTERIES: Right dominant configuration. Both origins are normal. No dissection, occlusion or flow-limiting stenosis to the vertebrobasilar confluence. CTA HEAD FINDINGS POSTERIOR CIRCULATION: --Vertebral arteries: Normal codominant configuration of V4 segments. --Posterior inferior cerebellar arteries (PICA): Both originate from the right vertebral artery. --Anterior inferior cerebellar arteries (AICA): Normal left AICA. Right AICA originates from the superior cerebellar artery. --Basilar artery: Normal. --Superior cerebellar arteries: Normal. --Posterior cerebral arteries (PCA): There is a fetal origin of the right PCA. There is multifocal moderate right PCA P2 segment stenosis. The left PCA is occluded at the proximal P2 segment. ANTERIOR CIRCULATION: --Intracranial internal carotid arteries: Atherosclerotic calcification of the internal carotid arteries at the skull base without hemodynamically significant stenosis. --Anterior cerebral arteries (ACA): Normal. Both A1 segments are present. Patent anterior communicating artery (a-comm). --Middle cerebral arteries (MCA): Normal. VENOUS SINUSES: As permitted by contrast timing, patent. ANATOMIC VARIANTS: None Review of the MIP images confirms the above findings. IMPRESSION: 1. No emergent large vessel occlusion. 2. Occlusion of the left PCA at the proximal P2 segment. 3. Multifocal moderate right PCA P2 segment stenosis. 4. Bilateral carotid bifurcation  atherosclerosis without hemodynamically significant stenosis by NASCET criteria. Electronically Signed   By: Ulyses Jarred M.D.   On: 01/10/2019 15:24   Ct Head Wo Contrast  Result Date: 01/10/2019 CLINICAL DATA:  Recent falls EXAM: CT HEAD WITHOUT CONTRAST CT CERVICAL SPINE WITHOUT CONTRAST TECHNIQUE: Multidetector CT imaging of the head and cervical spine was performed following the standard protocol without intravenous contrast. Multiplanar CT image reconstructions of the cervical spine were also generated. COMPARISON:  06/25/2018 FINDINGS: CT HEAD FINDINGS Brain: Chronic atrophic and ischemic changes are noted. No findings to suggest acute hemorrhage, acute infarction or space-occupying mass lesion are noted. Vascular: No hyperdense vessel or unexpected calcification. Skull: Normal. Negative for fracture or focal lesion. Sinuses/Orbits: Postsurgical changes in the anterior wall of  the left maxillary antrum and lateral wall left orbit are again seen and stable. Other: None CT CERVICAL SPINE FINDINGS Alignment: Within normal limits. Skull base and vertebrae: 7 cervical segments are well visualized. Vertebral body height is well maintained. Disc space narrowing is noted from C3-C7 with mild osteophytic changes. Mild facet hypertrophic changes are noted. No acute fracture or acute facet abnormality is noted. Mild neural foraminal narrowing is noted bilaterally. Soft tissues and spinal canal: Surrounding soft tissues are within normal limits. Upper chest: Visualized lung apices are unremarkable. Other: None IMPRESSION: CT of the head: Chronic atrophic and ischemic changes without acute intracranial abnormality. CT of the cervical spine: Multilevel degenerative change without acute abnormality. Electronically Signed   By: Inez Catalina M.D.   On: 01/10/2019 01:17   Ct Angio Neck W Or Wo Contrast  Result Date: 01/10/2019 CLINICAL DATA:  Stroke follow-up EXAM: CT ANGIOGRAPHY HEAD AND NECK TECHNIQUE: Multidetector  CT imaging of the head and neck was performed using the standard protocol during bolus administration of intravenous contrast. Multiplanar CT image reconstructions and MIPs were obtained to evaluate the vascular anatomy. Carotid stenosis measurements (when applicable) are obtained utilizing NASCET criteria, using the distal internal carotid diameter as the denominator. CONTRAST:  68mL OMNIPAQUE IOHEXOL 350 MG/ML SOLN COMPARISON:  Head CT 01/09/2019 FINDINGS: CTA NECK FINDINGS SKELETON: There is no bony spinal canal stenosis. No lytic or blastic lesion. OTHER NECK: Normal pharynx, larynx and major salivary glands. No cervical lymphadenopathy. Unremarkable thyroid gland. UPPER CHEST: Biapical emphysema. AORTIC ARCH: There is mild calcific atherosclerosis of the aortic arch. There is no aneurysm, dissection or hemodynamically significant stenosis of the visualized ascending aorta and aortic arch. Conventional 3 vessel aortic branching pattern. The visualized proximal subclavian arteries are widely patent. RIGHT CAROTID SYSTEM: --Common carotid artery: Widely patent origin without common carotid artery dissection or aneurysm. --Internal carotid artery: No dissection, occlusion or aneurysm. There is mixed density atherosclerosis extending into the proximal ICA, resulting in less than 50% stenosis. --External carotid artery: No acute abnormality. LEFT CAROTID SYSTEM: --Common carotid artery: Widely patent origin without common carotid artery dissection or aneurysm. --Internal carotid artery: No dissection, occlusion or aneurysm. Mild atherosclerotic calcification at the carotid bifurcation without hemodynamically significant stenosis. --External carotid artery: No acute abnormality. VERTEBRAL ARTERIES: Right dominant configuration. Both origins are normal. No dissection, occlusion or flow-limiting stenosis to the vertebrobasilar confluence. CTA HEAD FINDINGS POSTERIOR CIRCULATION: --Vertebral arteries: Normal codominant  configuration of V4 segments. --Posterior inferior cerebellar arteries (PICA): Both originate from the right vertebral artery. --Anterior inferior cerebellar arteries (AICA): Normal left AICA. Right AICA originates from the superior cerebellar artery. --Basilar artery: Normal. --Superior cerebellar arteries: Normal. --Posterior cerebral arteries (PCA): There is a fetal origin of the right PCA. There is multifocal moderate right PCA P2 segment stenosis. The left PCA is occluded at the proximal P2 segment. ANTERIOR CIRCULATION: --Intracranial internal carotid arteries: Atherosclerotic calcification of the internal carotid arteries at the skull base without hemodynamically significant stenosis. --Anterior cerebral arteries (ACA): Normal. Both A1 segments are present. Patent anterior communicating artery (a-comm). --Middle cerebral arteries (MCA): Normal. VENOUS SINUSES: As permitted by contrast timing, patent. ANATOMIC VARIANTS: None Review of the MIP images confirms the above findings. IMPRESSION: 1. No emergent large vessel occlusion. 2. Occlusion of the left PCA at the proximal P2 segment. 3. Multifocal moderate right PCA P2 segment stenosis. 4. Bilateral carotid bifurcation atherosclerosis without hemodynamically significant stenosis by NASCET criteria. Electronically Signed   By: Ulyses Jarred M.D.   On:  01/10/2019 15:24   Ct Cervical Spine Wo Contrast  Result Date: 01/10/2019 CLINICAL DATA:  Recent falls EXAM: CT HEAD WITHOUT CONTRAST CT CERVICAL SPINE WITHOUT CONTRAST TECHNIQUE: Multidetector CT imaging of the head and cervical spine was performed following the standard protocol without intravenous contrast. Multiplanar CT image reconstructions of the cervical spine were also generated. COMPARISON:  06/25/2018 FINDINGS: CT HEAD FINDINGS Brain: Chronic atrophic and ischemic changes are noted. No findings to suggest acute hemorrhage, acute infarction or space-occupying mass lesion are noted. Vascular: No  hyperdense vessel or unexpected calcification. Skull: Normal. Negative for fracture or focal lesion. Sinuses/Orbits: Postsurgical changes in the anterior wall of the left maxillary antrum and lateral wall left orbit are again seen and stable. Other: None CT CERVICAL SPINE FINDINGS Alignment: Within normal limits. Skull base and vertebrae: 7 cervical segments are well visualized. Vertebral body height is well maintained. Disc space narrowing is noted from C3-C7 with mild osteophytic changes. Mild facet hypertrophic changes are noted. No acute fracture or acute facet abnormality is noted. Mild neural foraminal narrowing is noted bilaterally. Soft tissues and spinal canal: Surrounding soft tissues are within normal limits. Upper chest: Visualized lung apices are unremarkable. Other: None IMPRESSION: CT of the head: Chronic atrophic and ischemic changes without acute intracranial abnormality. CT of the cervical spine: Multilevel degenerative change without acute abnormality. Electronically Signed   By: Inez Catalina M.D.   On: 01/10/2019 01:17   Mr Brain Wo Contrast  Result Date: 01/10/2019 CLINICAL DATA:  78 y/o  M; altered mental status and fall. EXAM: MRI HEAD WITHOUT CONTRAST TECHNIQUE: Multiplanar, multiecho pulse sequences of the brain and surrounding structures were obtained without intravenous contrast. COMPARISON:  01/09/2019 CT head. FINDINGS: Brain: 8 mm focus of reduced diffusion within the right caudate head (series 5, image 55) compatible with acute/early subacute infarction. No associated hemorrhage or mass effect. Motion degradation T2 and T2 FLAIR weighted sequences. Early confluent nonspecific T2 FLAIR hyperintensities in subcortical and periventricular white matter are compatible with moderate chronic microvascular ischemic changes. Moderate volume loss of the brain. No intracranial hemorrhage, extra-axial collection, hydrocephalus or herniation. Vascular: Normal flow voids. Skull and upper  cervical spine: Normal marrow signal. Sinuses/Orbits: Negative. Other: None. IMPRESSION: 1. 8 mm acute/early subacute infarction within the right caudate head. No hemorrhage or mass effect. 2. Moderate chronic microvascular ischemic changes and volume loss of the brain. These results were called by telephone at the time of interpretation on 01/10/2019 at 3:59 am to Dr. Leonides Schanz , who verbally acknowledged these results. Electronically Signed   By: Kristine Garbe M.D.   On: 01/10/2019 04:00   Dg Pelvis Portable  Result Date: 01/10/2019 CLINICAL DATA:  Fall.  Initial encounter. EXAM: PORTABLE PELVIS 1-2 VIEWS COMPARISON:  None. FINDINGS: There is no evidence of pelvic fracture or diastasis. No pelvic bone lesions are seen. IMPRESSION: No acute abnormality. Electronically Signed   By: San Morelle M.D.   On: 01/10/2019 07:11   Dg Chest Portable 1 View  Result Date: 01/09/2019 CLINICAL DATA:  Altered mental status and recent falls EXAM: PORTABLE CHEST 1 VIEW COMPARISON:  07/17/2018 FINDINGS: Cardiac shadow is within normal limits. Aortic calcifications are again noted. The lungs are well aerated bilaterally without focal infiltrate or sizable effusion. No bony abnormality is seen. Old rib fractures are noted on the left. IMPRESSION: No acute abnormality noted. Electronically Signed   By: Inez Catalina M.D.   On: 01/09/2019 23:33   Dg Swallowing Func-speech Pathology  Result Date: 01/10/2019 Objective  Swallowing Evaluation: Type of Study: MBS-Modified Barium Swallow Study  Patient Details Name: EVELIO RUEDA MRN: [id number] Date of Birth: 1941-08-03 Today's Date: 01/10/2019 Time: SLP Start Time (ACUTE ONLY): 1501 -SLP Stop Time (ACUTE ONLY): 4827 SLP Time Calculation (min) (ACUTE ONLY): 20 min Past Medical History: Past Medical History: Diagnosis Date  Chronic insomnia   Cigarette smoker   COPD (chronic obstructive pulmonary disease) ([place])   Diverticulosis of colon   Elevated prostate specific  antigen (PSA)   ETOH abuse   GERD (gastroesophageal reflux disease)   Hx of colonic polyps   Hyperlipidemia   Hypertension   Pulmonary nodule   Shortness of breath   Stroke [facility])  Past Surgical History: Past Surgical History: Procedure Laterality Date  COLONOSCOPY    ESOPHAGOGASTRODUODENOSCOPY (EGD) WITH PROPOFOL N/A 07/21/2018  Procedure: ESOPHAGOGASTRODUODENOSCOPY (EGD) WITH PROPOFOL;  Surgeon: Lavena Bullion, DO;  Location: [facility] ENDOSCOPY;  Service: Gastroenterology;  Laterality: N/A;  needle biopsy RUL nodule  01/2009  benign  TONSILLECTOMY  1947  VASECTOMY   HPI: CEASAR DECANDIA is a 78 y.o. male with with history of stroke seizures, alcohol abuse, COPD was brought to the ER after patient's friend called EMS since patient had 2 falls over the last 24 hours with confusion. CT head was unremarkable. COVID-19 test was negative. MRI 8 mm acute/early subacute infarction within the right caudate head. No hemorrhage or mass effect, moderate chronic microvascular ischemic changes and volume loss of the brain. Barium esophagram 07/19/18 distal esophageal stricture extending for a length of approximately 17 mm, narrowing the esophagus to approximately 6-7 mm. This has the appearance of a benign stricture, significant esophageal dysmotility with tertiary contractions and barium stasis. MBS 07/15/18 primary esophageal dysphagia, backflow from esophagus into pharynx, residue throughout esophagus. Flash penetration of thin. CXR No acute abnormality noted.  No data recorded Assessment / Plan / Recommendation [facility] IP CLINICAL IMPRESSIONS 01/10/2019 Clinical Impression Pt demonstrates min-mild oropharyngeal dysphagia (oral > pharyngeal) and evidence of previously diagnosed esophageal dysphagia. Difficulty gathering and containing bolus in a cupped manner resulting in sublingual spill with thin and nectar. Mild lingual residue, sensed with subswallow to clear. Overall, pharyngeal phase was within functional limits.  There was minimal residue in vallecular that did not increase as study progressed. Laryngeal elevation and epiglottic deflection was functional to prevent any penetration or aspiration during studys. Video image of esophagus showed slow transit and periods of widened then very narrow places along esophagus. Recommend Dys 2, thin liquids, straws allowed, remain upright 45 min after meals, small sips and pills whole in applesauce. ST will continue to follow.    SLP Visit Diagnosis Dysphagia, unspecified (R13.10) Attention and concentration deficit following -- Frontal lobe and executive function deficit following -- Impact on safety and function Moderate aspiration risk   [facility] IP TREATMENT RECOMMENDATION 01/10/2019 Treatment Recommendations Defer until completion of intrumental exam   Prognosis 07/17/2018 Prognosis for Safe Diet Advancement Good Barriers to Reach Goals -- Barriers/Prognosis Comment -- [facility] IP DIET RECOMMENDATION 07/24/2018 SLP Diet Recommendations -- Liquid Administration via -- Medication Administration -- Compensations Slow rate;Small sips/bites;Follow solids with liquid Postural Changes --   [facility] IP OTHER RECOMMENDATIONS 07/17/2018 Recommended Consults Consider esophageal assessment Oral Care Recommendations -- Other Recommendations --   [facility] IP FOLLOW UP RECOMMENDATIONS 07/24/2018 Follow up Recommendations Skilled Nursing facility   [facility] IP FREQUENCY AND DURATION 07/17/2018 Speech Therapy Frequency (ACUTE ONLY) min 3x week Treatment Duration 1 week      [facility] IP ORAL PHASE 01/10/2019 Oral  Phase Impaired Oral - Pudding Teaspoon -- Oral - Pudding Cup -- Oral - Honey Teaspoon -- Oral - Honey Cup -- Oral - Nectar Teaspoon -- Oral - Nectar Cup Decreased bolus cohesion;Other (Comment) Oral - Nectar Straw -- Oral - Thin Teaspoon -- Oral - Thin Cup Decreased bolus cohesion;Lingual/palatal residue Oral - Thin Straw Decreased bolus cohesion Oral - Puree -- Oral - Mech Soft -- Oral - Regular (No Data) Oral -  Multi-Consistency -- Oral - Pill -- Oral Phase - Comment --  [facility] IP PHARYNGEAL PHASE 01/10/2019 Pharyngeal Phase Impaired Pharyngeal- Pudding Teaspoon -- Pharyngeal -- Pharyngeal- Pudding Cup -- Pharyngeal -- Pharyngeal- Honey Teaspoon -- Pharyngeal -- Pharyngeal- Honey Cup -- Pharyngeal -- Pharyngeal- Nectar Teaspoon -- Pharyngeal -- Pharyngeal- Nectar Cup Pharyngeal residue - valleculae Pharyngeal -- Pharyngeal- Nectar Straw -- Pharyngeal -- Pharyngeal- Thin Teaspoon -- Pharyngeal -- Pharyngeal- Thin Cup Pharyngeal residue - valleculae Pharyngeal -- Pharyngeal- Thin Straw Pharyngeal residue - valleculae Pharyngeal -- Pharyngeal- Puree -- Pharyngeal -- Pharyngeal- Mechanical Soft -- Pharyngeal -- Pharyngeal- Regular (No Data) Pharyngeal -- Pharyngeal- Multi-consistency -- Pharyngeal -- Pharyngeal- Pill -- Pharyngeal -- Pharyngeal Comment --  [facility] IP CERVICAL ESOPHAGEAL PHASE 01/10/2019 Cervical Esophageal Phase (No Data) Pudding Teaspoon -- Pudding Cup -- Honey Teaspoon -- Honey Cup -- Nectar Teaspoon -- Nectar Cup -- Nectar Straw -- Thin Teaspoon -- Thin Cup -- Thin Straw -- Puree -- Mechanical Soft -- Regular -- Multi-consistency -- Pill -- Cervical Esophageal Comment -- [place] Siren 01/10/2019, 4:03 PM Orbie Pyo Litaker M.Ed Actor Pager [phone] Office [phone]              Vas US Carotid (at [place] Only)  Result Date: 01/10/2019 Carotid Arterial Duplex Study Indications: CVA. Performing Technologist: June Leap RDMS, RVT  Examination Guidelines: A complete evaluation includes B-mode imaging, spectral Doppler, color Doppler, and power Doppler as needed of all accessible portions of each vessel. Bilateral testing is considered an integral part of a complete examination. Limited examinations for reoccurring indications may be performed as noted.  Right Carotid Findings: +----------+--------+--------+--------+------------+--------+             PSV cm/s EDV  cm/s Stenosis Describe     Comments  +----------+--------+--------+--------+------------+--------+  CCA Prox   114      21                                       +----------+--------+--------+--------+------------+--------+  CCA Distal 75       19                                       +----------+--------+--------+--------+------------+--------+  ICA Prox   74       28       1-39%    heterogenous           +----------+--------+--------+--------+------------+--------+  ICA Distal 116      33                                       +----------+--------+--------+--------+------------+--------+  ECA        112      15                                       +----------+--------+--------+--------+------------+--------+ +---------+--------+--+--------+-+---------+  Vertebral PSV cm/s 20 EDV cm/s 6 Antegrade  +---------+--------+--+--------+-+---------+  Left Carotid Findings: +----------+--------+--------+--------+------------+--------+             PSV cm/s EDV cm/s Stenosis Describe     Comments  +----------+--------+--------+--------+------------+--------+  CCA Prox   95       10                                       +----------+--------+--------+--------+------------+--------+  CCA Distal 78       7                                        +----------+--------+--------+--------+------------+--------+  ICA Prox   61       23       1-39%    heterogenous           +----------+--------+--------+--------+------------+--------+  ICA Distal 77       22                                       +----------+--------+--------+--------+------------+--------+  ECA        71       12                                       +----------+--------+--------+--------+------------+--------+ +---------+--------+--+--------+-+---------+  Vertebral PSV cm/s 33 EDV cm/s 4 Antegrade  +---------+--------+--+--------+-+---------+  Summary: Right Carotid: Velocities in the right ICA are consistent with a 1-39% stenosis. Left Carotid: Velocities in the left  ICA are consistent with a 1-39% stenosis. Vertebrals: Bilateral vertebral arteries demonstrate antegrade flow. *See table(s) above for measurements and observations.  Electronically signed by Antony Contras MD on 01/10/2019 at 12:37:16 PM.   Final      CBC Recent Labs  Lab 01/09/19 2218 01/10/19 0022 01/10/19 0433 01/12/19 0346  WBC 16.8*  --  13.9* 8.7  HGB 15.4 14.6 13.7 12.0*  HCT 46.7 43.0 39.8 36.0*  PLT 237  --  197 182  MCV 96.3  --  96.4 95.7  MCH 31.8  --  33.2 31.9  MCHC 33.0  --  34.4 33.3  RDW 13.2  --  13.4 13.4  LYMPHSABS 1.6  --  1.7  --   MONOABS 1.3*  --  1.4*  --   EOSABS 0.1  --  0.1  --   BASOSABS 0.0  --  0.0  --     Chemistries  Recent Labs  Lab 01/09/19 2218 01/10/19 0022 01/10/19 0433  NA 136 135 135  K 4.7 4.1 4.4  CL 104  --  104  CO2 18*  --  18*  GLUCOSE 92  --  90  BUN 15  --  16  CREATININE 1.16  --  1.09  CALCIUM 9.6  --  9.1  AST 44*  --  39  ALT 14  --  16  ALKPHOS 90  --  71  BILITOT 1.9*  --  2.1*   ------------------------------------------------------------------------------------------------------------------ Recent Labs    01/10/19 0433  CHOL 131  HDL 49  LDLCALC 69  TRIG 66  CHOLHDL 2.7    Lab Results  Component Value  Date   HGBA1C 4.7 (L) 01/10/2019   ------------------------------------------------------------------------------------------------------------------ Recent Labs    01/10/19 0433  TSH 0.391   ------------------------------------------------------------------------------------------------------------------ No results for input(s): VITAMINB12, FOLATE, FERRITIN, TIBC, IRON, RETICCTPCT in the last 72 hours.  Coagulation profile Recent Labs  Lab 01/09/19 2314  INR 1.0    No results for input(s): DDIMER in the last 72 hours.  Cardiac Enzymes Recent Labs  Lab 01/10/19 0433  TROPONINI 0.03*    ------------------------------------------------------------------------------------------------------------------ No results found for: BNP   Roxan Hockey M.D on 01/12/2019 at 3:52 PM  Go to [email or web address] - for contact info  [facility] - Office  [phone]

## 2019-01-13 LAB — BASIC METABOLIC PANEL
Anion gap: 10 (ref 5–15)
BUN: 8 mg/dL (ref 8–23)
CO2: 26 mmol/L (ref 22–32)
Calcium: 8.9 mg/dL (ref 8.9–10.3)
Chloride: 103 mmol/L (ref 98–111)
Creatinine, Ser: 0.76 mg/dL (ref 0.61–1.24)
GFR calc Af Amer: >60 mL/min (ref >60)
GFR calc non Af Amer: >60 mL/min (ref >60)
Glucose, Bld: 119 mg/dL — ABNORMAL HIGH (ref 70–99)
Potassium: 3.7 mmol/L (ref 3.5–5.1)
Sodium: 139 mmol/L (ref 135–145)

## 2019-01-13 LAB — CBC
HCT: 39 % (ref 39.0–52.0)
Hemoglobin: 13 g/dL (ref 13.0–17.0)
MCH: 32.4 pg (ref 26.0–34.0)
MCHC: 33.3 g/dL (ref 30.0–36.0)
MCV: 97.3 fL (ref 80.0–100.0)
Platelets: 199 10*3/uL (ref 150–400)
RBC: 4.01 MIL/uL — ABNORMAL LOW (ref 4.22–5.81)
RDW: 13.6 % (ref 11.5–15.5)
WBC: 8.1 10*3/uL (ref 4.0–10.5)
nRBC: 0 % (ref 0.0–0.2)

## 2019-01-13 MED ORDER — ATORVASTATIN CALCIUM 20 MG PO TABS: 20.0000 mg | ORAL_TABLET | Freq: Every day | ORAL | 11 refills | Status: DC

## 2019-01-13 MED ORDER — FOLIC ACID 1 MG PO TABS: 1.0000 mg | ORAL_TABLET | Freq: Every day | ORAL | 2 refills | Status: DC

## 2019-01-13 MED ORDER — ASPIRIN 325 MG PO TABS: 325.0000 mg | ORAL_TABLET | Freq: Every day | ORAL | 2 refills | Status: DC

## 2019-01-13 MED ORDER — ADULT MULTIVITAMIN W/MINERALS CH: 1.0000 | ORAL_TABLET | Freq: Every day | ORAL | 2 refills | Status: DC

## 2019-01-13 MED ORDER — NICOTINE 14 MG/24HR TD PT24: 14.0000 mg | MEDICATED_PATCH | Freq: Every day | TRANSDERMAL | 0 refills | Status: AC

## 2019-01-13 MED ORDER — QUETIAPINE FUMARATE 25 MG PO TABS: 25.0000 mg | ORAL_TABLET | Freq: Every day | ORAL | 1 refills | Status: DC

## 2019-01-13 NOTE — Progress Notes (Signed)
CSW spoke with the patient over the phone. Patient chose to go to [facility].   CSW will continue to follow and assist with discharge.   Domenic Schwab, MSW, [place]

## 2019-01-13 NOTE — Discharge Summary (Signed)
Blake [name], is a 78 y.o. male  DOB 10/10/1940  MRN [id number].  Admission date:  01/09/2019  Admitting Physician  Rise Patience, MD  Discharge Date:  01/13/2019   Primary MD  Patient, No Pcp Per  Recommendations for primary care physician for things to follow:   1)dysphagia 2 diet with thin liquids may take pills whole in applesauce, If patient is not able to be upgraded with solids during acute rehab stay, recommend addressing this at SNF with SLP intervention. 2)Avoid ibuprofen/Advil/Aleve/Motrin/Goody Powders/Naproxen/BC powders/Meloxicam/Diclofenac/Indomethacin and other Nonsteroidal anti-inflammatory medications as these will make you more likely to bleed and can cause stomach ulcers, can also cause Kidney problems.  3) continue physical therapy, Occupational Therapy and speech therapy   Admission Diagnosis  Fall [W19.XXXA] Right-sided cerebrovascular accident (CVA) [facility]) [I63.9] Acute CVA (cerebrovascular accident) [facility]) [I63.9]   Discharge Diagnosis  Fall [W19.XXXA] Right-sided cerebrovascular accident (CVA) ([place]) [I63.9] Acute CVA (cerebrovascular accident) ([place]) [I63.9]    Principal Problem:   Acute CVA (cerebrovascular accident) ([place]) Active Problems:   ETOH abuse   Acute encephalopathy   Hypertension   Seizure disorder ([place])      Past Medical History:  Diagnosis Date   Chronic insomnia    Cigarette smoker    COPD (chronic obstructive pulmonary disease) ([facility])    Diverticulosis of colon    Elevated prostate specific antigen (PSA)    ETOH abuse    GERD (gastroesophageal reflux disease)    Hx of colonic polyps    Hyperlipidemia    Hypertension    Pulmonary nodule    Shortness of breath    Stroke [facility])     Past Surgical History:  Procedure Laterality Date   COLONOSCOPY     ESOPHAGOGASTRODUODENOSCOPY (EGD) WITH PROPOFOL N/A 07/21/2018   Procedure:  ESOPHAGOGASTRODUODENOSCOPY (EGD) WITH PROPOFOL;  Surgeon: Lavena Bullion, DO;  Location: [place];  Service: Gastroenterology;  Laterality: N/A;   needle biopsy RUL nodule  01/2009   benign   TONSILLECTOMY  1947   VASECTOMY         HPI  from the history and physical done on the day of admission:    Chief Complaint: Fall and confusion.  HPI: Blake [name] is a 78 y.o. male with with history of previous stroke seizures alcohol abuse COPD was brought to the ER after patient's friend called EMS since patient had 2 falls over the last 24 hours and has been not himself with confusion.  On arrival at the patient's home patient was found to be confused.  No further history is obtained.  Most of the history I obtained from ER physician as patient is confused.  ED Course: In the ER patient was confused agitated requiring Ativan.  Alcohol levels were negative.  Ammonia was 19.  AST was 44 ALT was 14 creatinine 1.1 WBC count was 16.8 hemoglobin 15.4 platelets 237.  CT head was unremarkable patient was febrile temperature of 100.4.  Chest x-ray unremarkable.  UA showing negative for nitrites bacteria none WBC none.  COVID-19 test was negative.  EKG was showing normal sinus rhythm heart rate 62 bpm.  Blood cultures were sent and empirically started on antibiotics for unknown source of infection.  MRI brain was ordered and it shows acute stroke.  Neurology was consulted and admitted for acute stroke acute encephalopathy.    Hospital Course:   Brain MRIIMPRESSION: 1)8 mm acute/early subacute infarction within the right caudate head. No hemorrhage or mass effect. 2. Moderate chronic microvascular ischemic changes and volume loss of the brain.  Brief Summary 78 y.o.malewithwith history of previous stroke seizures alcohol abuse COPDadmitted on 01/10/2019 with recurrent falls and confusion/agitation-COVID-19 test negative,MRI brain confirms right caudate stroke.As per neurologist MRI  findings of small right caudate stroke may not fully explain patient's neuro symptoms,   A/p  1)acute infarction of the right caudate---neuro consult appreciated,,echo with EF of 55 to 63%, diastolic dysfunction noted, no regional wall motion normalities, no intracardiac thrombus, carotid artery Dopplers without hemodynamically stenosis,,swallow eval appreciated, continue aspirin 325 and Lipitor .Marland KitchenMarland KitchenA1c is 4.7, LDL 69, HDL is 49,     2)Metabolic Encephalopathy with confusion in an alcoholic male-----overall much improved, more coherent, cooperative----  possible  SZin a pt with H/o Sz --treated with IV Keppra,EEG without acute findings, okay to discharge on p.o. Keppra, no evidence of significant DT symptoms at this time patient was treated with high-dose IV thiamine per neurologist recommendations .As per neurologist MRI findings of small right caudate stroke may not fully explain patient's neuro symptoms, indicates encephalopathy cannot be ruled out,alcohol withdrawal is another differential,as well as seizures  3)Fever + Leukocytosis--- fevers and leukocytosis-resolved, no further antibiotics indicated at this time , initially treated with IV Vanco and Zosyn  patient received Rocephin  on 01/11/2019 and 01/12/19  4)COPD/Smoker---chest x-ray without acute pneumonia, no acute flareup, continue bronchodilators   5)H/o Sz---EEG without acute findings, continue Keppra  6)Dysphagia----speech and swallow eval appreciated,  recommend dysphagia 2 diet with thin liquids may take pills whole in applesauce, If patient is not able to be upgraded with solids during acute rehab stay, recommend addressing this at SNF with SLP intervention.  7)Disposition---  SNF rehab   Code Status : Full code  Consults  : Neurology  Discharge Condition: stable,   Diet and Activity recommendation:  As advised  Discharge Instructions    Discharge Instructions    Call MD for:  difficulty  breathing, headache or visual disturbances   Complete by:  As directed    Call MD for:  persistant dizziness or light-headedness   Complete by:  As directed    Call MD for:  persistant nausea and vomiting   Complete by:  As directed    Call MD for:  severe uncontrolled pain   Complete by:  As directed    Call MD for:  temperature >100.4   Complete by:  As directed    Diet - low sodium heart healthy   Complete by:  As directed    dysphagia 2 diet with thin liquids may take pills whole in applesauce, If patient is not able to be upgraded with solids during acute rehab stay, recommend addressing this at SNF with SLP intervention.   Discharge instructions   Complete by:  As directed    1)dysphagia 2 diet with thin liquids may take pills whole in applesauce, If patient is not able to be upgraded with solids during acute rehab stay, recommend addressing this at SNF with SLP intervention. 2)Avoid ibuprofen/Advil/Aleve/Motrin/Goody Powders/Naproxen/BC powders/Meloxicam/Diclofenac/Indomethacin and other Nonsteroidal anti-inflammatory medications  as these will make you more likely to bleed and can cause stomach ulcers, can also cause Kidney problems.  3) continue physical therapy, Occupational Therapy and speech therapy   Increase activity slowly   Complete by:  As directed    Out of bed with PT/OT        Discharge Medications     Allergies as of 01/13/2019   No Known Allergies     Medication List    STOP taking these medications   nicotine 21 mg/24hr patch Commonly known as:  NICODERM CQ - dosed in mg/24 hours Replaced by:  nicotine 14 mg/24hr patch     TAKE these medications   aspirin 325 MG tablet Take 1 tablet (325 mg total) by mouth daily with breakfast.   atorvastatin 20 MG tablet Commonly known as:  Lipitor Take 1 tablet (20 mg total) by mouth daily.   folic acid 1 MG tablet Commonly known as:  FOLVITE Take 1 tablet (1 mg total) by mouth daily.   gemfibrozil 600 MG  tablet Commonly known as:  LOPID Take 1 tablet (600 mg total) by mouth 2 (two) times daily.   ipratropium-albuterol 0.5-2.5 (3) MG/3ML Soln Commonly known as:  DUONEB Take 3 mLs by nebulization See admin instructions. Nebulize and inhale 3 ml's into the lungs at 6 AM, 2 PM, and 10 PM for 5 days with a start date of 07/06/18 AND 3 ml's every six hours as needed for coughing   levETIRAcetam 500 MG tablet Commonly known as:  KEPPRA Take 1 tablet (500 mg total) by mouth 2 (two) times daily.   multivitamin with minerals Tabs tablet Take 1 tablet by mouth daily. Start taking on:  January 14, 2019   nicotine 14 mg/24hr patch Commonly known as:  NICODERM CQ - dosed in mg/24 hours Place 1 patch (14 mg total) onto the skin daily. Replaces:  nicotine 21 mg/24hr patch   QUEtiapine 25 MG tablet Commonly known as:  SEROQUEL Take 1 tablet (25 mg total) by mouth at bedtime. What changed:  medication strength   thiamine 100 MG tablet Take 1 tablet (100 mg total) by mouth daily.   Vitamin D-3 25 MCG (1000 UT) Caps Take 1 capsule (1,000 Units total) by mouth daily.      Major procedures and Radiology Reports - PLEASE review detailed and final reports for all details, in brief -   Ct Angio Head W Or Wo Contrast  Result Date: 01/10/2019 CLINICAL DATA:  Stroke follow-up EXAM: CT ANGIOGRAPHY HEAD AND NECK TECHNIQUE: Multidetector CT imaging of the head and neck was performed using the standard protocol during bolus administration of intravenous contrast. Multiplanar CT image reconstructions and MIPs were obtained to evaluate the vascular anatomy. Carotid stenosis measurements (when applicable) are obtained utilizing NASCET criteria, using the distal internal carotid diameter as the denominator. CONTRAST:  42mL OMNIPAQUE IOHEXOL 350 MG/ML SOLN COMPARISON:  Head CT 01/09/2019 FINDINGS: CTA NECK FINDINGS SKELETON: There is no bony spinal canal stenosis. No lytic or blastic lesion. OTHER NECK: Normal  pharynx, larynx and major salivary glands. No cervical lymphadenopathy. Unremarkable thyroid gland. UPPER CHEST: Biapical emphysema. AORTIC ARCH: There is mild calcific atherosclerosis of the aortic arch. There is no aneurysm, dissection or hemodynamically significant stenosis of the visualized ascending aorta and aortic arch. Conventional 3 vessel aortic branching pattern. The visualized proximal subclavian arteries are widely patent. RIGHT CAROTID SYSTEM: --Common carotid artery: Widely patent origin without common carotid artery dissection or aneurysm. --Internal carotid artery: No dissection,  occlusion or aneurysm. There is mixed density atherosclerosis extending into the proximal ICA, resulting in less than 50% stenosis. --External carotid artery: No acute abnormality. LEFT CAROTID SYSTEM: --Common carotid artery: Widely patent origin without common carotid artery dissection or aneurysm. --Internal carotid artery: No dissection, occlusion or aneurysm. Mild atherosclerotic calcification at the carotid bifurcation without hemodynamically significant stenosis. --External carotid artery: No acute abnormality. VERTEBRAL ARTERIES: Right dominant configuration. Both origins are normal. No dissection, occlusion or flow-limiting stenosis to the vertebrobasilar confluence. CTA HEAD FINDINGS POSTERIOR CIRCULATION: --Vertebral arteries: Normal codominant configuration of V4 segments. --Posterior inferior cerebellar arteries (PICA): Both originate from the right vertebral artery. --Anterior inferior cerebellar arteries (AICA): Normal left AICA. Right AICA originates from the superior cerebellar artery. --Basilar artery: Normal. --Superior cerebellar arteries: Normal. --Posterior cerebral arteries (PCA): There is a fetal origin of the right PCA. There is multifocal moderate right PCA P2 segment stenosis. The left PCA is occluded at the proximal P2 segment. ANTERIOR CIRCULATION: --Intracranial internal carotid arteries:  Atherosclerotic calcification of the internal carotid arteries at the skull base without hemodynamically significant stenosis. --Anterior cerebral arteries (ACA): Normal. Both A1 segments are present. Patent anterior communicating artery (a-comm). --Middle cerebral arteries (MCA): Normal. VENOUS SINUSES: As permitted by contrast timing, patent. ANATOMIC VARIANTS: None Review of the MIP images confirms the above findings. IMPRESSION: 1. No emergent large vessel occlusion. 2. Occlusion of the left PCA at the proximal P2 segment. 3. Multifocal moderate right PCA P2 segment stenosis. 4. Bilateral carotid bifurcation atherosclerosis without hemodynamically significant stenosis by NASCET criteria. Electronically Signed   By: Ulyses Jarred M.D.   On: 01/10/2019 15:24   Ct Head Wo Contrast  Result Date: 01/10/2019 CLINICAL DATA:  Recent falls EXAM: CT HEAD WITHOUT CONTRAST CT CERVICAL SPINE WITHOUT CONTRAST TECHNIQUE: Multidetector CT imaging of the head and cervical spine was performed following the standard protocol without intravenous contrast. Multiplanar CT image reconstructions of the cervical spine were also generated. COMPARISON:  06/25/2018 FINDINGS: CT HEAD FINDINGS Brain: Chronic atrophic and ischemic changes are noted. No findings to suggest acute hemorrhage, acute infarction or space-occupying mass lesion are noted. Vascular: No hyperdense vessel or unexpected calcification. Skull: Normal. Negative for fracture or focal lesion. Sinuses/Orbits: Postsurgical changes in the anterior wall of the left maxillary antrum and lateral wall left orbit are again seen and stable. Other: None CT CERVICAL SPINE FINDINGS Alignment: Within normal limits. Skull base and vertebrae: 7 cervical segments are well visualized. Vertebral body height is well maintained. Disc space narrowing is noted from C3-C7 with mild osteophytic changes. Mild facet hypertrophic changes are noted. No acute fracture or acute facet abnormality is  noted. Mild neural foraminal narrowing is noted bilaterally. Soft tissues and spinal canal: Surrounding soft tissues are within normal limits. Upper chest: Visualized lung apices are unremarkable. Other: None IMPRESSION: CT of the head: Chronic atrophic and ischemic changes without acute intracranial abnormality. CT of the cervical spine: Multilevel degenerative change without acute abnormality. Electronically Signed   By: Inez Catalina M.D.   On: 01/10/2019 01:17   Ct Angio Neck W Or Wo Contrast  Result Date: 01/10/2019 CLINICAL DATA:  Stroke follow-up EXAM: CT ANGIOGRAPHY HEAD AND NECK TECHNIQUE: Multidetector CT imaging of the head and neck was performed using the standard protocol during bolus administration of intravenous contrast. Multiplanar CT image reconstructions and MIPs were obtained to evaluate the vascular anatomy. Carotid stenosis measurements (when applicable) are obtained utilizing NASCET criteria, using the distal internal carotid diameter as the denominator. CONTRAST:  3mL  OMNIPAQUE IOHEXOL 350 MG/ML SOLN COMPARISON:  Head CT 01/09/2019 FINDINGS: CTA NECK FINDINGS SKELETON: There is no bony spinal canal stenosis. No lytic or blastic lesion. OTHER NECK: Normal pharynx, larynx and major salivary glands. No cervical lymphadenopathy. Unremarkable thyroid gland. UPPER CHEST: Biapical emphysema. AORTIC ARCH: There is mild calcific atherosclerosis of the aortic arch. There is no aneurysm, dissection or hemodynamically significant stenosis of the visualized ascending aorta and aortic arch. Conventional 3 vessel aortic branching pattern. The visualized proximal subclavian arteries are widely patent. RIGHT CAROTID SYSTEM: --Common carotid artery: Widely patent origin without common carotid artery dissection or aneurysm. --Internal carotid artery: No dissection, occlusion or aneurysm. There is mixed density atherosclerosis extending into the proximal ICA, resulting in less than 50% stenosis. --External  carotid artery: No acute abnormality. LEFT CAROTID SYSTEM: --Common carotid artery: Widely patent origin without common carotid artery dissection or aneurysm. --Internal carotid artery: No dissection, occlusion or aneurysm. Mild atherosclerotic calcification at the carotid bifurcation without hemodynamically significant stenosis. --External carotid artery: No acute abnormality. VERTEBRAL ARTERIES: Right dominant configuration. Both origins are normal. No dissection, occlusion or flow-limiting stenosis to the vertebrobasilar confluence. CTA HEAD FINDINGS POSTERIOR CIRCULATION: --Vertebral arteries: Normal codominant configuration of V4 segments. --Posterior inferior cerebellar arteries (PICA): Both originate from the right vertebral artery. --Anterior inferior cerebellar arteries (AICA): Normal left AICA. Right AICA originates from the superior cerebellar artery. --Basilar artery: Normal. --Superior cerebellar arteries: Normal. --Posterior cerebral arteries (PCA): There is a fetal origin of the right PCA. There is multifocal moderate right PCA P2 segment stenosis. The left PCA is occluded at the proximal P2 segment. ANTERIOR CIRCULATION: --Intracranial internal carotid arteries: Atherosclerotic calcification of the internal carotid arteries at the skull base without hemodynamically significant stenosis. --Anterior cerebral arteries (ACA): Normal. Both A1 segments are present. Patent anterior communicating artery (a-comm). --Middle cerebral arteries (MCA): Normal. VENOUS SINUSES: As permitted by contrast timing, patent. ANATOMIC VARIANTS: None Review of the MIP images confirms the above findings. IMPRESSION: 1. No emergent large vessel occlusion. 2. Occlusion of the left PCA at the proximal P2 segment. 3. Multifocal moderate right PCA P2 segment stenosis. 4. Bilateral carotid bifurcation atherosclerosis without hemodynamically significant stenosis by NASCET criteria. Electronically Signed   By: Ulyses Jarred M.D.    On: 01/10/2019 15:24   Ct Cervical Spine Wo Contrast  Result Date: 01/10/2019 CLINICAL DATA:  Recent falls EXAM: CT HEAD WITHOUT CONTRAST CT CERVICAL SPINE WITHOUT CONTRAST TECHNIQUE: Multidetector CT imaging of the head and cervical spine was performed following the standard protocol without intravenous contrast. Multiplanar CT image reconstructions of the cervical spine were also generated. COMPARISON:  06/25/2018 FINDINGS: CT HEAD FINDINGS Brain: Chronic atrophic and ischemic changes are noted. No findings to suggest acute hemorrhage, acute infarction or space-occupying mass lesion are noted. Vascular: No hyperdense vessel or unexpected calcification. Skull: Normal. Negative for fracture or focal lesion. Sinuses/Orbits: Postsurgical changes in the anterior wall of the left maxillary antrum and lateral wall left orbit are again seen and stable. Other: None CT CERVICAL SPINE FINDINGS Alignment: Within normal limits. Skull base and vertebrae: 7 cervical segments are well visualized. Vertebral body height is well maintained. Disc space narrowing is noted from C3-C7 with mild osteophytic changes. Mild facet hypertrophic changes are noted. No acute fracture or acute facet abnormality is noted. Mild neural foraminal narrowing is noted bilaterally. Soft tissues and spinal canal: Surrounding soft tissues are within normal limits. Upper chest: Visualized lung apices are unremarkable. Other: None IMPRESSION: CT of the head: Chronic atrophic and ischemic  changes without acute intracranial abnormality. CT of the cervical spine: Multilevel degenerative change without acute abnormality. Electronically Signed   By: Inez Catalina M.D.   On: 01/10/2019 01:17   Mr Brain Wo Contrast  Result Date: 01/10/2019 CLINICAL DATA:  78 y/o  M; altered mental status and fall. EXAM: MRI HEAD WITHOUT CONTRAST TECHNIQUE: Multiplanar, multiecho pulse sequences of the brain and surrounding structures were obtained without intravenous  contrast. COMPARISON:  01/09/2019 CT head. FINDINGS: Brain: 8 mm focus of reduced diffusion within the right caudate head (series 5, image 55) compatible with acute/early subacute infarction. No associated hemorrhage or mass effect. Motion degradation T2 and T2 FLAIR weighted sequences. Early confluent nonspecific T2 FLAIR hyperintensities in subcortical and periventricular white matter are compatible with moderate chronic microvascular ischemic changes. Moderate volume loss of the brain. No intracranial hemorrhage, extra-axial collection, hydrocephalus or herniation. Vascular: Normal flow voids. Skull and upper cervical spine: Normal marrow signal. Sinuses/Orbits: Negative. Other: None. IMPRESSION: 1. 8 mm acute/early subacute infarction within the right caudate head. No hemorrhage or mass effect. 2. Moderate chronic microvascular ischemic changes and volume loss of the brain. These results were called by telephone at the time of interpretation on 01/10/2019 at 3:59 am to Dr. Leonides Schanz , who verbally acknowledged these results. Electronically Signed   By: Kristine Garbe M.D.   On: 01/10/2019 04:00   Dg Pelvis Portable  Result Date: 01/10/2019 CLINICAL DATA:  Fall.  Initial encounter. EXAM: PORTABLE PELVIS 1-2 VIEWS COMPARISON:  None. FINDINGS: There is no evidence of pelvic fracture or diastasis. No pelvic bone lesions are seen. IMPRESSION: No acute abnormality. Electronically Signed   By: San Morelle M.D.   On: 01/10/2019 07:11   Dg Chest Portable 1 View  Result Date: 01/09/2019 CLINICAL DATA:  Altered mental status and recent falls EXAM: PORTABLE CHEST 1 VIEW COMPARISON:  07/17/2018 FINDINGS: Cardiac shadow is within normal limits. Aortic calcifications are again noted. The lungs are well aerated bilaterally without focal infiltrate or sizable effusion. No bony abnormality is seen. Old rib fractures are noted on the left. IMPRESSION: No acute abnormality noted. Electronically Signed   By:  Inez Catalina M.D.   On: 01/09/2019 23:33   Dg Swallowing Func-speech Pathology  Result Date: 01/10/2019 Objective Swallowing Evaluation: Type of Study: MBS-Modified Barium Swallow Study  Patient Details Name: Blake [name] MRN: [id number] Date of Birth: Oct 02, 1940 Today's Date: 01/10/2019 Time: SLP Start Time (ACUTE ONLY): 1501 -SLP Stop Time (ACUTE ONLY): 1521 SLP Time Calculation (min) (ACUTE ONLY): 20 min Past Medical History: Past Medical History: Diagnosis Date  Chronic insomnia   Cigarette smoker   COPD (chronic obstructive pulmonary disease) ([place])   Diverticulosis of colon   Elevated prostate specific antigen (PSA)   ETOH abuse   GERD (gastroesophageal reflux disease)   Hx of colonic polyps   Hyperlipidemia   Hypertension   Pulmonary nodule   Shortness of breath   Stroke [facility])  Past Surgical History: Past Surgical History: Procedure Laterality Date  COLONOSCOPY    ESOPHAGOGASTRODUODENOSCOPY (EGD) WITH PROPOFOL N/A 07/21/2018  Procedure: ESOPHAGOGASTRODUODENOSCOPY (EGD) WITH PROPOFOL;  Surgeon: Lavena Bullion, DO;  Location: [facility];  Service: Gastroenterology;  Laterality: N/A;  needle biopsy RUL nodule  01/2009  benign  TONSILLECTOMY  1947  VASECTOMY   HPI: Blake [name] is a 78 y.o. male with with history of stroke seizures, alcohol abuse, COPD was brought to the ER after patient's friend called EMS since patient had 2 falls over the last 24  hours with confusion. CT head was unremarkable. COVID-19 test was negative. MRI 8 mm acute/early subacute infarction within the right caudate head. No hemorrhage or mass effect, moderate chronic microvascular ischemic changes and volume loss of the brain. Barium esophagram 07/19/18 distal esophageal stricture extending for a length of approximately 17 mm, narrowing the esophagus to approximately 6-7 mm. This has the appearance of a benign stricture, significant esophageal dysmotility with tertiary contractions and barium stasis. MBS 07/15/18  primary esophageal dysphagia, backflow from esophagus into pharynx, residue throughout esophagus. Flash penetration of thin. CXR No acute abnormality noted.  No data recorded Assessment / Plan / Recommendation CH[facility] IP CLINICAL IMPRESSIONS 01/10/2019 Clinical Impression Pt demonstrates min-mild oropharyngeal dysphagia (oral > pharyngeal) and evidence of previously diagnosed esophageal dysphagia. Difficulty gathering and containing bolus in a cupped manner resulting in sublingual spill with thin and nectar. Mild lingual residue, sensed with subswallow to clear. Overall, pharyngeal phase was within functional limits. There was minimal residue in vallecular that did not increase as study progressed. Laryngeal elevation and epiglottic deflection was functional to prevent any penetration or aspiration during studys. Video image of esophagus showed slow transit and periods of widened then very narrow places along esophagus. Recommend Dys 2, thin liquids, straws allowed, remain upright 45 min after meals, small sips and pills whole in applesauce. ST will continue to follow.    SLP Visit Diagnosis Dysphagia, unspecified (R13.10) Attention and concentration deficit following -- Frontal lobe and executive function deficit following -- Impact on safety and function Moderate aspiration risk   CHL IP TREATMENT RECOMMENDATION 01/10/2019 Treatment Recommendations Defer until completion of intrumental exam   Prognosis 07/17/2018 Prognosis for Safe Diet Advancement Good Barriers to Reach Goals -- Barriers/Prognosis Comment -- CHL IP DIET RECOMMENDATION 07/24/2018 SLP Diet Recommendations -- Liquid Administration via -- Medication Administration -- Compensations Slow rate;Small sips/bites;Follow solids with liquid Postural Changes --   CHL IP OTHER RECOMMENDATIONS 07/17/2018 Recommended Consults Consider esophageal assessment Oral Care Recommendations -- Other Recommendations --   [facility] IP FOLLOW UP RECOMMENDATIONS 07/24/2018 Follow up  Recommendations Skilled Nursing facility   [facility] IP FREQUENCY AND DURATION 07/17/2018 Speech Therapy Frequency (ACUTE ONLY) min 3x week Treatment Duration 1 week      CHL IP ORAL PHASE 01/10/2019 Oral Phase Impaired Oral - Pudding Teaspoon -- Oral - Pudding Cup -- Oral - Honey Teaspoon -- Oral - Honey Cup -- Oral - Nectar Teaspoon -- Oral - Nectar Cup Decreased bolus cohesion;Other (Comment) Oral - Nectar Straw -- Oral - Thin Teaspoon -- Oral - Thin Cup Decreased bolus cohesion;Lingual/palatal residue Oral - Thin Straw Decreased bolus cohesion Oral - Puree -- Oral - Mech Soft -- Oral - Regular (No Data) Oral - Multi-Consistency -- Oral - Pill -- Oral Phase - Comment --  CHL IP PHARYNGEAL PHASE 01/10/2019 Pharyngeal Phase Impaired Pharyngeal- Pudding Teaspoon -- Pharyngeal -- Pharyngeal- Pudding Cup -- Pharyngeal -- Pharyngeal- Honey Teaspoon -- Pharyngeal -- Pharyngeal- Honey Cup -- Pharyngeal -- Pharyngeal- Nectar Teaspoon -- Pharyngeal -- Pharyngeal- Nectar Cup Pharyngeal residue - valleculae Pharyngeal -- Pharyngeal- Nectar Straw -- Pharyngeal -- Pharyngeal- Thin Teaspoon -- Pharyngeal -- Pharyngeal- Thin Cup Pharyngeal residue - valleculae Pharyngeal -- Pharyngeal- Thin Straw Pharyngeal residue - valleculae Pharyngeal -- Pharyngeal- Puree -- Pharyngeal -- Pharyngeal- Mechanical Soft -- Pharyngeal -- Pharyngeal- Regular (No Data) Pharyngeal -- Pharyngeal- Multi-consistency -- Pharyngeal -- Pharyngeal- Pill -- Pharyngeal -- Pharyngeal Comment --  CHL IP CERVICAL ESOPHAGEAL PHASE 01/10/2019 Cervical Esophageal Phase (No Data) Pudding Teaspoon -- Pudding Cup --  Honey Teaspoon -- Honey Cup -- Nectar Teaspoon -- Nectar Cup -- Nectar Straw -- Thin Teaspoon -- Thin Cup -- Thin Straw -- Puree -- Mechanical Soft -- Regular -- Multi-consistency -- Pill -- Cervical Esophageal Comment -- [place] Siren 01/10/2019, 4:03 PM Orbie Pyo Colvin Caroli.Ed Actor Pager [phone] Office [phone]               Vas US Carotid (at [place] Only)  Result Date: 01/10/2019 Carotid Arterial Duplex Study Indications: CVA. Performing Technologist: June Leap RDMS, RVT  Examination Guidelines: A complete evaluation includes B-mode imaging, spectral Doppler, color Doppler, and power Doppler as needed of all accessible portions of each vessel. Bilateral testing is considered an integral part of a complete examination. Limited examinations for reoccurring indications may be performed as noted.  Right Carotid Findings: +----------+--------+--------+--------+------------+--------+             PSV cm/s EDV cm/s Stenosis Describe     Comments  +----------+--------+--------+--------+------------+--------+  CCA Prox   114      21                                       +----------+--------+--------+--------+------------+--------+  CCA Distal 75       19                                       +----------+--------+--------+--------+------------+--------+  ICA Prox   74       28       1-39%    heterogenous           +----------+--------+--------+--------+------------+--------+  ICA Distal 116      33                                       +----------+--------+--------+--------+------------+--------+  ECA        112      15                                       +----------+--------+--------+--------+------------+--------+ +---------+--------+--+--------+-+---------+  Vertebral PSV cm/s 20 EDV cm/s 6 Antegrade  +---------+--------+--+--------+-+---------+  Left Carotid Findings: +----------+--------+--------+--------+------------+--------+             PSV cm/s EDV cm/s Stenosis Describe     Comments  +----------+--------+--------+--------+------------+--------+  CCA Prox   95       10                                       +----------+--------+--------+--------+------------+--------+  CCA Distal 78       7                                        +----------+--------+--------+--------+------------+--------+  ICA Prox   61       23       1-39%     heterogenous           +----------+--------+--------+--------+------------+--------+  ICA Distal 77  22                                       +----------+--------+--------+--------+------------+--------+  ECA        71       12                                       +----------+--------+--------+--------+------------+--------+ +---------+--------+--+--------+-+---------+  Vertebral PSV cm/s 33 EDV cm/s 4 Antegrade  +---------+--------+--+--------+-+---------+  Summary: Right Carotid: Velocities in the right ICA are consistent with a 1-39% stenosis. Left Carotid: Velocities in the left ICA are consistent with a 1-39% stenosis. Vertebrals: Bilateral vertebral arteries demonstrate antegrade flow. *See table(s) above for measurements and observations.  Electronically signed by Antony Contras MD on 01/10/2019 at 12:37:16 PM.   Final     Micro Results   Recent Results (from the past 240 hour(s))  Blood culture (routine x 2)     Status: None (Preliminary result)   Collection Time: 01/10/19 12:35 AM  Result Value Ref Range Status   Specimen Description BLOOD RIGHT FOREARM  Final   Special Requests   Final    BOTTLES DRAWN AEROBIC AND ANAEROBIC Blood Culture results may not be optimal due to an excessive volume of blood received in culture bottles   Culture   Final    NO GROWTH 3 DAYS Performed at [facility], [place] [address] [place], [place] [address]    Report Status PENDING  Incomplete  Blood culture (routine x 2)     Status: None (Preliminary result)   Collection Time: 01/10/19  1:10 AM  Result Value Ref Range Status   Specimen Description BLOOD LEFT HAND  Final   Special Requests   Final    BOTTLES DRAWN AEROBIC AND ANAEROBIC Blood Culture adequate volume   Culture   Final    NO GROWTH 3 DAYS Performed at [facility], [place] [address], [place],  [address]    Report Status PENDING  Incomplete  SARS Coronavirus 2 [facility] order, Performed in [place] hospital lab)      Status: None   Collection Time: 01/10/19  1:47 AM  Result Value Ref Range Status   SARS Coronavirus 2 NEGATIVE NEGATIVE Final    Comment: (NOTE) If result is NEGATIVE SARS-CoV-2 target nucleic acids are NOT DETECTED. The SARS-CoV-2 RNA is generally detectable in upper and lower  respiratory specimens during the acute phase of infection. The lowest  concentration of SARS-CoV-2 viral copies this assay can detect is 250  copies / mL. A negative result does not preclude SARS-CoV-2 infection  and should not be used as the sole basis for treatment or other  patient management decisions.  A negative result may occur with  improper specimen collection / handling, submission of specimen other  than nasopharyngeal swab, presence of viral mutation(s) within the  areas targeted by this assay, and inadequate number of viral copies  (<250 copies / mL). A negative result must be combined with clinical  observations, patient history, and epidemiological information. If result is POSITIVE SARS-CoV-2 target nucleic acids are DETECTED. The SARS-CoV-2 RNA is generally detectable in upper and lower  respiratory specimens dur ing the acute phase of infection.  Positive  results are indicative of active infection with SARS-CoV-2.  Clinical  correlation with patient history  and other diagnostic information is  necessary to determine patient infection status.  Positive results do  not rule out bacterial infection or co-infection with other viruses. If result is PRESUMPTIVE POSTIVE SARS-CoV-2 nucleic acids MAY BE PRESENT.   A presumptive positive result was obtained on the submitted specimen  and confirmed on repeat testing.  While 2019 novel coronavirus  (SARS-CoV-2) nucleic acids may be present in the submitted sample  additional confirmatory testing may be necessary for epidemiological  and / or clinical management purposes  to differentiate between  SARS-CoV-2 and other Sarbecovirus currently known to  infect humans.  If clinically indicated additional testing with an alternate test  methodology [phone]) is advised. The SARS-CoV-2 RNA is generally  detectable in upper and lower respiratory sp ecimens during the acute  phase of infection. The expected result is Negative. Fact Sheet for Patients:  StrictlyIdeas.no Fact Sheet for Healthcare Providers: BankingDealers.co.za This test is not yet approved or cleared by the [place] FDA and has been authorized for detection and/or diagnosis of SARS-CoV-2 by FDA under an Emergency Use Authorization (EUA).  This EUA will remain in effect (meaning this test can be used) for the duration of the COVID-19 declaration under Section 564(b)(1) of the Act, 21 U.S.C. section 360bbb-3(b)(1), unless the authorization is terminated or revoked sooner. Performed at [facility], [place] [address], [place], [place] [address]        Today   Subjective    Blake [name] today has no new concerns, no chest pains no palpitations no dizziness, patient is less confused, more coherent,          Patient has been seen and examined prior to discharge   Objective   Blood pressure (!) 145/69, pulse (!) 50, temperature 97.7 F (36.5 C), temperature source Oral, resp. rate 20, height 5\' 10"  (1.778 m), weight 68 kg, SpO2 99 %.   Intake/Output Summary (Last 24 hours) at 01/13/2019 1336 Last data filed at 01/13/2019 1331 Gross per 24 hour  Intake 2580 ml  Output 3180 ml  Net -600 ml    Exam Gen:- Awake, less confused, cooperative  HEENT:- [place].AT, No sclera icterus Neck-Supple Neck,No JVD,.  Lungs-  CTAB , fair symmetrical air movement CV- S1, S2 normal, regular  Abd-  +ve B.Sounds, Abd Soft, No tenderness,    Extremity/Skin:- No  edema, pedal pulses present  Psych-affect is appropriate, forgetful and occasionally disoriented Neuro-generalized weakness , no new focal deficits, no tremors   Data  Review   CBC w Diff:  Lab Results  Component Value Date   WBC 8.1 01/13/2019   HGB 13.0 01/13/2019   HCT 39.0 01/13/2019   PLT 199 01/13/2019   LYMPHOPCT 12 01/10/2019   BANDSPCT 2 07/06/2018   MONOPCT 10 01/10/2019   EOSPCT 1 01/10/2019   BASOPCT 0 01/10/2019    CMP:  Lab Results  Component Value Date   NA 139 01/13/2019   K 3.7 01/13/2019   CL 103 01/13/2019   CO2 26 01/13/2019   BUN 8 01/13/2019   CREATININE 0.76 01/13/2019   PROT 5.9 (L) 01/10/2019   ALBUMIN 3.6 01/10/2019   BILITOT 2.1 (H) 01/10/2019   ALKPHOS 71 01/10/2019   AST 39 01/10/2019   ALT 16 01/10/2019  .   Total Discharge time is about 33 minutes  Roxan Hockey M.D on 01/13/2019 at 1:36 PM  Go to [email or web address] -  for contact info  [facility] - Office  [phone]

## 2019-01-13 NOTE — TOC Transition Note (Signed)
Transition of Care [facility]) - CM/SW Discharge Note   Patient Details  Name: KINNEY SACKMANN MRN: [id number] Date of Birth: 08-21-1941  [facility] CM/SW Contact:  Gelene Mink, [place] Phone Number: 01/13/2019, 3:03 PM   Clinical Narrative:   Nurse to call report to [phone] and patient will be going to room 117B. PTAR has been called and scheduled for 3:45pm.     Final next level of care: [place] Barriers to Discharge: No Barriers Identified   Patient Goals and CMS Choice Patient states their goals for this hospitalization and ongoing recovery are:: Pt will go to rehab CMS Medicare.gov Compare Post Acute Care list provided to:: Patient Choice offered to / list presented to : Patient  Discharge Placement   Existing PASRR number confirmed : 01/11/19          Patient [facility] at: [facility] Patient to be transferred to facility by: [place] Name of family member notified: Santiago Glad Patient and family notified of of transfer: 01/13/19  Discharge Plan and Services In-house Referral: Clinical Social Work              DME Arranged: N/A DME Agency: NA HH Arranged: NA HH Agency: NA   Social Determinants of Health (SDOH) Interventions     Readmission Risk Interventions No flowsheet data found.

## 2019-01-13 NOTE — Discharge Instructions (Signed)
1)dysphagia 2 diet with thin liquids may take pills whole in applesauce, If patient is not able to be upgraded with solids during acute rehab stay, recommend addressing this at SNF with SLP intervention. 2)Avoid ibuprofen/Advil/Aleve/Motrin/Goody Powders/Naproxen/BC powders/Meloxicam/Diclofenac/Indomethacin and other Nonsteroidal anti-inflammatory medications as these will make you more likely to bleed and can cause stomach ulcers, can also cause Kidney problems.  3) continue physical therapy, Occupational Therapy and speech therapy

## 2019-01-13 NOTE — Progress Notes (Signed)
Report called to Garvin Fila, Therapist, sports at [employer] [phone] at [phone]. Patient dressed, all lines removed, belongings packed, and AVS reviewed with patient. PTAR given report and patient taken via stretcher for discharge.

## 2019-01-15 LAB — CULTURE, BLOOD (ROUTINE X 2)
Culture: NO GROWTH
Culture: NO GROWTH
Special Requests: ADEQUATE

## 2019-12-23 ENCOUNTER — Emergency Department (HOSPITAL_COMMUNITY)
Admission: EM | Admit: 2019-12-23 | Discharge: 2019-12-23 | Disposition: A | Discharge status: Home / Self Care | Payer: Medicare Other | Attending: Emergency Medicine | Admitting: Emergency Medicine

## 2019-12-23 ENCOUNTER — Encounter (HOSPITAL_COMMUNITY): Payer: Self-pay

## 2019-12-23 DIAGNOSIS — F05 Delirium due to known physiological condition: Secondary | ICD-10-CM | POA: Diagnosis not present

## 2019-12-23 DIAGNOSIS — Z7982 Long term (current) use of aspirin: Secondary | ICD-10-CM | POA: Diagnosis not present

## 2019-12-23 DIAGNOSIS — Z8673 Personal history of transient ischemic attack (TIA), and cerebral infarction without residual deficits: Secondary | ICD-10-CM | POA: Diagnosis not present

## 2019-12-23 DIAGNOSIS — I10 Essential (primary) hypertension: Secondary | ICD-10-CM | POA: Insufficient documentation

## 2019-12-23 DIAGNOSIS — F17210 Nicotine dependence, cigarettes, uncomplicated: Secondary | ICD-10-CM | POA: Diagnosis not present

## 2019-12-23 DIAGNOSIS — N179 Acute kidney failure, unspecified: Secondary | ICD-10-CM | POA: Diagnosis not present

## 2019-12-23 DIAGNOSIS — Z20822 Contact with and (suspected) exposure to covid-19: Secondary | ICD-10-CM | POA: Insufficient documentation

## 2019-12-23 DIAGNOSIS — Z79899 Other long term (current) drug therapy: Secondary | ICD-10-CM | POA: Insufficient documentation

## 2019-12-23 DIAGNOSIS — J449 Chronic obstructive pulmonary disease, unspecified: Secondary | ICD-10-CM | POA: Insufficient documentation

## 2019-12-23 DIAGNOSIS — R410 Disorientation, unspecified: Secondary | ICD-10-CM | POA: Diagnosis present

## 2019-12-23 LAB — COMPREHENSIVE METABOLIC PANEL
ALT: 7 U/L (ref 0–44)
AST: 17 U/L (ref 15–41)
Albumin: 3.2 g/dL — ABNORMAL LOW (ref 3.5–5.0)
Alkaline Phosphatase: 60 U/L (ref 38–126)
Anion gap: 11 (ref 5–15)
BUN: 31 mg/dL — ABNORMAL HIGH (ref 8–23)
CO2: 20 mmol/L — ABNORMAL LOW (ref 22–32)
Calcium: 8.9 mg/dL (ref 8.9–10.3)
Chloride: 106 mmol/L (ref 98–111)
Creatinine, Ser: 2.51 mg/dL — ABNORMAL HIGH (ref 0.61–1.24)
GFR calc Af Amer: 27 mL/min — ABNORMAL LOW (ref >60)
GFR calc non Af Amer: 24 mL/min — ABNORMAL LOW (ref >60)
Glucose, Bld: 122 mg/dL — ABNORMAL HIGH (ref 70–99)
Potassium: 4.4 mmol/L (ref 3.5–5.1)
Sodium: 137 mmol/L (ref 135–145)
Total Bilirubin: 0.7 mg/dL (ref 0.3–1.2)
Total Protein: 6.5 g/dL (ref 6.5–8.1)

## 2019-12-23 LAB — RESPIRATORY PANEL BY RT PCR (FLU A&B, COVID)
Influenza A by PCR: NEGATIVE
Influenza B by PCR: NEGATIVE
SARS Coronavirus 2 by RT PCR: NEGATIVE

## 2019-12-23 LAB — CBC
HCT: 36.5 % — ABNORMAL LOW (ref 39.0–52.0)
Hemoglobin: 11.7 g/dL — ABNORMAL LOW (ref 13.0–17.0)
MCH: 28.4 pg (ref 26.0–34.0)
MCHC: 32.1 g/dL (ref 30.0–36.0)
MCV: 88.6 fL (ref 80.0–100.0)
Platelets: 271 10*3/uL (ref 150–400)
RBC: 4.12 MIL/uL — ABNORMAL LOW (ref 4.22–5.81)
RDW: 23.8 % — ABNORMAL HIGH (ref 11.5–15.5)
WBC: 12.3 10*3/uL — ABNORMAL HIGH (ref 4.0–10.5)
nRBC: 0 % (ref 0.0–0.2)

## 2019-12-23 LAB — URINALYSIS, ROUTINE W REFLEX MICROSCOPIC
Bilirubin Urine: NEGATIVE
Glucose, UA: NEGATIVE mg/dL
Ketones, ur: NEGATIVE mg/dL
Leukocytes,Ua: NEGATIVE
Nitrite: NEGATIVE
Protein, ur: 100 mg/dL — AB
RBC / HPF: >50 RBC/hpf — ABNORMAL HIGH (ref 0–5)
Specific Gravity, Urine: 1.015 (ref 1.005–1.030)
pH: 6 (ref 5.0–8.0)

## 2019-12-23 LAB — LACTIC ACID, PLASMA: Lactic Acid, Venous: 1 mmol/L (ref 0.5–1.9)

## 2019-12-23 LAB — CBG MONITORING, ED: Glucose-Capillary: 111 mg/dL — ABNORMAL HIGH (ref 70–99)

## 2019-12-23 MED ORDER — SODIUM CHLORIDE 0.9% FLUSH
3.0000 mL | Freq: Once | INTRAVENOUS | Status: AC
  Administered 2019-12-23: 3 mL via INTRAVENOUS

## 2019-12-23 MED ORDER — LACTATED RINGERS IV BOLUS
2000.0000 mL | Freq: Once | INTRAVENOUS | Status: AC
  Administered 2019-12-23: 2000 mL via INTRAVENOUS

## 2019-12-23 NOTE — ED Notes (Signed)
Blood Cultures collected using Kurin Device.

## 2019-12-23 NOTE — ED Notes (Signed)
Patient verbalizes understanding of discharge instructions. Opportunity for questioning and answers were provided. Armband removed by staff, pt discharged from ED. Pt. ambulatory and discharged home.  

## 2019-12-23 NOTE — Discharge Instructions (Addendum)
Follow-up with you PCP in 2-3 days for repeat metabolic panel.

## 2019-12-23 NOTE — ED Triage Notes (Signed)
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

## 2019-12-26 NOTE — ED Provider Notes (Signed)
[facility] EMERGENCY DEPARTMENT Provider Note   CSN: [id number] Arrival date & time: 12/23/19  1858     History No chief complaint on file.   Blake [name] is a 79 y.o. male.  HPI   79 year old male brought in by EMS.  Friend found him in his home after she checked on him because he did not answer the phone.  Reports that when he entered his residence the house was extremely hot.  It was to the point that "it took my breath away when I open the door."  Patient was found very warm to the touch and confused.  He had been incontinent.  He was helped out of the residence and he slowly became more more oriented.  Son reports that he is more or less at his baseline currently.  Son states that he thinks that the patient's thermostat is malfunctioning.  Patient himself has no acute complaints currently.  Denies any acute pain, dyspnea.  He is alert and oriented.  Past Medical History:  Diagnosis Date  . Chronic insomnia   . Cigarette smoker   . COPD (chronic obstructive pulmonary disease) ([place])   . Diverticulosis of colon   . Elevated prostate specific antigen (PSA)   . ETOH abuse   . GERD (gastroesophageal reflux disease)   . Hx of colonic polyps   . Hyperlipidemia   . Hypertension   . Pulmonary nodule   . Shortness of breath   . Stroke [facility])     Patient Active Problem List   Diagnosis Date Noted  . Acute CVA (cerebrovascular accident) ([place]) 01/10/2019  . Esophageal stricture   . Abnormal finding on GI tract imaging   . Dysphagia 07/20/2018  . Malnutrition of moderate degree 07/10/2018  . Aspiration pneumonia ([place]) 07/07/2018  . Seizure disorder ([place]) 07/07/2018  . HLD (hyperlipidemia) 07/07/2018  . Acute hypoxemic respiratory failure ([place]) 07/07/2018  . Encounter for intubation   . Acute respiratory failure with hypercapnia ([place])   . HCAP (healthcare-associated pneumonia)   . Protein-calorie malnutrition, severe 06/26/2018  . Alcohol withdrawal seizure  ([place]) 06/25/2018  . New onset seizure ([place]) 01/07/2018  . Hypertension 01/07/2018  . GERD (gastroesophageal reflux disease) 01/07/2018  . Hyperlipidemia 01/07/2018  . Cigarette smoker 01/07/2018  . Alcohol abuse 01/07/2018  . Acute alcohol abuse, with delirium ([place])   . Acute encephalopathy 10/14/2014  . Unsteady gait 10/14/2014  . Alcohol withdrawal delirium ([place]) 10/14/2014  . Lipoma of neck 12/22/2012  . Cerebral atrophy ([place]) 09/28/2012  . Cerebrovascular small vessel disease 09/28/2012  . Left humeral fracture 07/04/2012  . Delirium 07/04/2012  . ETOH abuse   . ELEVATED PROSTATE SPECIFIC ANTIGEN 09/30/2010  . COPD (chronic obstructive pulmonary disease) ([place]) 01/28/2009  . PULMONARY NODULE 01/28/2009  . INSOMNIA, CHRONIC 10/15/2007  . COLONIC POLYPS 10/10/2007  . Type 2 diabetes mellitus ([place]) 10/10/2007  . Dyslipidemia 10/10/2007  . Essential hypertension 10/10/2007  . GERD 10/10/2007  . DIVERTICULOSIS OF COLON 10/10/2007    Past Surgical History:  Procedure Laterality Date  . COLONOSCOPY    . ESOPHAGOGASTRODUODENOSCOPY (EGD) WITH PROPOFOL N/A 07/21/2018   Procedure: ESOPHAGOGASTRODUODENOSCOPY (EGD) WITH PROPOFOL;  Surgeon: Lavena Bullion, DO;  Location: [facility];  Service: Gastroenterology;  Laterality: N/A;  . needle biopsy RUL nodule  01/2009   benign  . TONSILLECTOMY  1947  . VASECTOMY         Family History  Problem Relation Age of Onset  . Diabetes Father   .  Lung cancer Brother   . Colon cancer Neg Hx   . Esophageal cancer Neg Hx   . Stomach cancer Neg Hx     Social History   Tobacco Use  . Smoking status: Current Every Day Smoker    Packs/day: 1.00    Years: 55.00    Pack years: 55.00    Types: Cigarettes  . Smokeless tobacco: Never Used  . Tobacco comment: not smoking @ SNF  Substance Use Topics  . Alcohol use: Yes    Alcohol/week: 24.0 standard drinks    Types: 24 Cans of beer per week    Comment: 3-4 daily  . Drug use: No     Home Medications Prior to Admission medications   Medication Sig Start Date End Date Taking? Authorizing Provider  aspirin 325 MG tablet Take 1 tablet (325 mg total) by mouth daily with breakfast. 01/13/19   Denton Brick, Courage, MD  atorvastatin (LIPITOR) 20 MG tablet Take 1 tablet (20 mg total) by mouth daily. 01/13/19 01/13/20  Roxan Hockey, MD  Cholecalciferol (VITAMIN D-3) 25 MCG (1000 UT) CAPS Take 1 capsule (1,000 Units total) by mouth daily. 08/11/18   Medina-Vargas, Monina C, NP  folic acid (FOLVITE) 1 MG tablet Take 1 tablet (1 mg total) by mouth daily. 01/13/19   Roxan Hockey, MD  gemfibrozil (LOPID) 600 MG tablet Take 1 tablet (600 mg total) by mouth 2 (two) times daily. 08/11/18   Medina-Vargas, Monina C, NP  ipratropium-albuterol (DUONEB) 0.5-2.5 (3) MG/3ML SOLN Take 3 mLs by nebulization See admin instructions. Nebulize and inhale 3 ml's into the lungs at 6 AM, 2 PM, and 10 PM for 5 days with a start date of 07/06/18 AND 3 ml's every six hours as needed for coughing 08/11/18   Medina-Vargas, Monina C, NP  levETIRAcetam (KEPPRA) 500 MG tablet Take 1 tablet (500 mg total) by mouth 2 (two) times daily. 08/11/18   Medina-Vargas, Monina C, NP  Multiple Vitamin (MULTIVITAMIN WITH MINERALS) TABS tablet Take 1 tablet by mouth daily. 01/14/19   Roxan Hockey, MD  nicotine (NICODERM CQ - DOSED IN MG/24 HOURS) 14 mg/24hr patch Place 1 patch (14 mg total) onto the skin daily. 01/13/19 01/13/20  Roxan Hockey, MD  QUEtiapine (SEROQUEL) 25 MG tablet Take 1 tablet (25 mg total) by mouth at bedtime. 01/13/19   Roxan Hockey, MD  thiamine 100 MG tablet Take 1 tablet (100 mg total) by mouth daily. 08/11/18   Medina-Vargas, Monina C, NP    Allergies    Patient has no known allergies.  Review of Systems   Review of Systems All systems reviewed and negative, other than as noted in HPI.  Physical Exam Updated Vital Signs BP (!) 163/91   Pulse 77   Temp 98.2 F (36.8 C)   Resp 18    Ht 5\' 10"  (1.778 m)   Wt 68 kg   SpO2 97%   BMI 21.51 kg/m   Physical Exam Vitals and nursing note reviewed.  Constitutional:      General: He is not in acute distress.    Appearance: He is well-developed.  HENT:     Head: Normocephalic and atraumatic.  Eyes:     General:        Right eye: No discharge.        Left eye: No discharge.     Conjunctiva/sclera: Conjunctivae normal.     Comments: Blind right eye  Cardiovascular:     Rate and Rhythm: Normal rate and regular rhythm.  Heart sounds: Normal heart sounds. No murmur. No friction rub. No gallop.   Pulmonary:     Effort: Pulmonary effort is normal. No respiratory distress.     Breath sounds: Normal breath sounds.  Abdominal:     General: There is no distension.     Palpations: Abdomen is soft.     Tenderness: There is no abdominal tenderness.  Musculoskeletal:        General: No tenderness.     Cervical back: Neck supple.  Skin:    General: Skin is warm and dry.  Neurological:     Mental Status: He is alert.  Psychiatric:        Behavior: Behavior normal.        Thought Content: Thought content normal.     ED Results / Procedures / Treatments   Labs (all labs ordered are listed, but only abnormal results are displayed) Labs Reviewed  COMPREHENSIVE METABOLIC PANEL - Abnormal; Notable for the following components:      Result Value   CO2 20 (*)    Glucose, Bld 122 (*)    BUN 31 (*)    Creatinine, Ser 2.51 (*)    Albumin 3.2 (*)    GFR calc non Af Amer 24 (*)    GFR calc Af Amer 27 (*)    All other components within normal limits  CBC - Abnormal; Notable for the following components:   WBC 12.3 (*)    RBC 4.12 (*)    Hemoglobin 11.7 (*)    HCT 36.5 (*)    RDW 23.8 (*)    All other components within normal limits  URINALYSIS, ROUTINE W REFLEX MICROSCOPIC - Abnormal; Notable for the following components:   Color, Urine AMBER (*)    APPearance CLOUDY (*)    Hgb urine dipstick LARGE (*)    Protein, ur  100 (*)    RBC / HPF >50 (*)    Bacteria, UA RARE (*)    All other components within normal limits  CBG MONITORING, ED - Abnormal; Notable for the following components:   Glucose-Capillary 111 (*)    All other components within normal limits  CULTURE, BLOOD (ROUTINE X 2)  RESPIRATORY PANEL BY RT PCR (FLU A&B, COVID)  URINE CULTURE  LACTIC ACID, PLASMA    EKG EKG Interpretation  Date/Time:  Sunday December 23 2019 19:05:55 EDT Ventricular Rate:  79 PR Interval:    QRS Duration: 112 QT Interval:  423 QTC Calculation: 485 R Axis:   -51 Text Interpretation: Sinus rhythm Prolonged PR interval LAD, consider left anterior fascicular block Borderline prolonged QT interval Confirmed by ,  ([id number]) on 12/23/2019 9:13:51 PM   Radiology No results found.  Procedures Procedures (including critical care time)  Medications Ordered in ED Medications  sodium chloride flush (NS) 0.9 % injection 3 mL (3 mLs Intravenous Given 12/23/19 1929)  lactated ringers bolus 2,000 mL (0 mLs Intravenous Stopped 12/23/19 2145)    ED Course  I have reviewed the triage vital signs and the nursing notes.  Pertinent labs & imaging results that were available during my care of the patient were reviewed by me and considered in my medical decision making (see chart for details).    MDM Rules/Calculators/A&P                      78  year old male with acute confusion.  Found in a very hot residence.  Symptoms resolved with cooling.  Afebrile here in the  emergency room.  AKI noted.  He was given 2 L of fluids.  He is now back to his baseline.  Has no acute complaints.  His friend/POA is at bedside.  Reports that he will return to the residence and fix his thermostat.  He is comfortable with taking the patient home the patient is comfortable leaving.  Needs a follow-up for his hematuria.  Return precautions discussed.  Final Clinical Impression(s) / ED Diagnoses Final diagnoses:  AKI (acute kidney  injury) ([place])  Acute confusional state    Rx / DC Orders ED Discharge Orders    None       Virgel Manifold, MD 12/26/19 1820

## 2019-12-28 LAB — CULTURE, BLOOD (ROUTINE X 2)
Culture: NO GROWTH
Special Requests: ADEQUATE

## 2020-01-05 IMAGING — MR MR HEAD W/O CM
8 of 10 series · 38 of 48 positions shown · non-contrast
Comparison: Head CT 06/25/2018.  Brain MRI 10/15/2014.

CLINICAL DATA: 77-year-old male with left side weakness and altered
mental status.

EXAM:
MRI HEAD WITHOUT CONTRAST
TECHNIQUE: Multiplanar, multiecho pulse sequences of the brain and surrounding
structures were obtained without intravenous contrast.

[Series 3: DWI · axial · 3.0mm · 0.94mm/px · z∈[-46,+101]mm · 9 of 100 slices shown (1 of 2)]
[im 1/100]
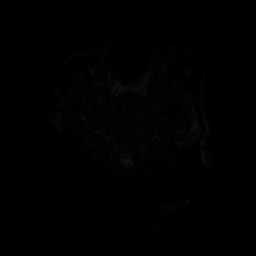
[im 19/100]
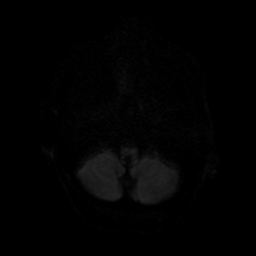
[im 28/100]
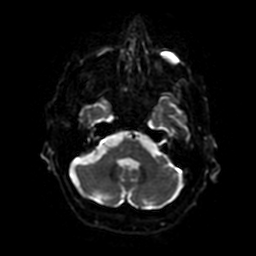
[im 46/100]
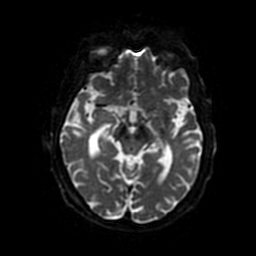
[im 55/100]
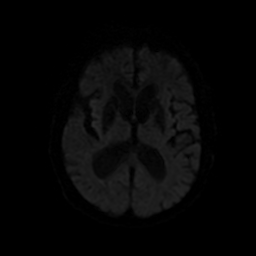
[im 73/100]
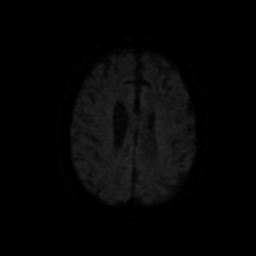
[im 82/100]
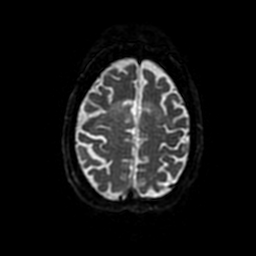
[im 91/100]
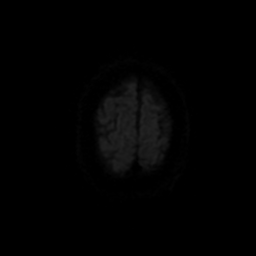
[im 100/100]
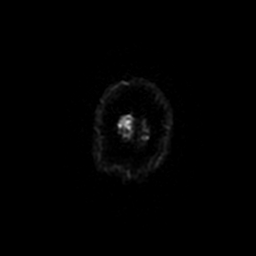

[Series 4: DWI · coronal · 4.0mm · 0.94mm/px · 9 of 72 slices shown (2 of 2)]
[im 1/72]
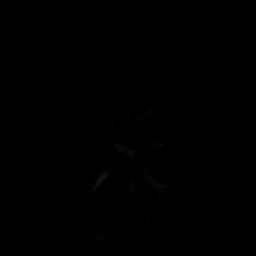
[im 9/72]
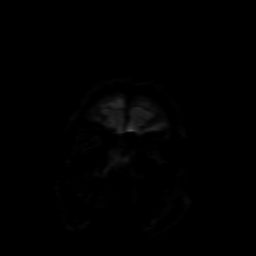
[im 18/72]
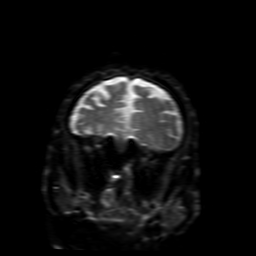
[im 27/72]
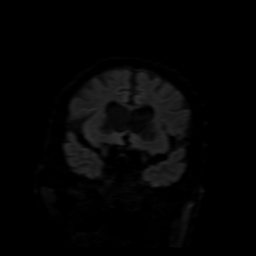
[im 36/72]
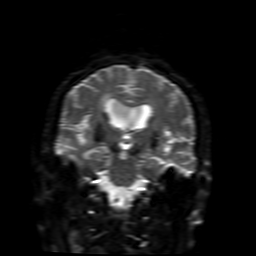
[im 45/72]
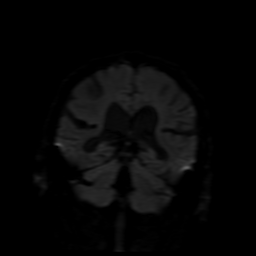
[im 54/72]
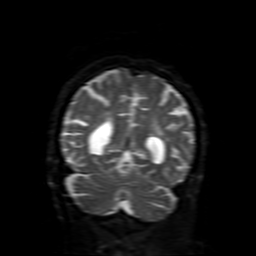
[im 63/72]
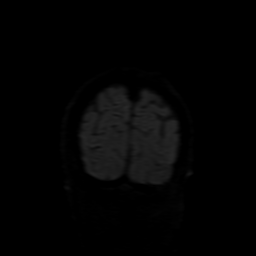
[im 72/72]
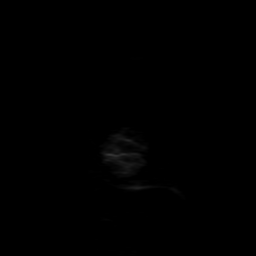

[Series 5: FLAIR · axial · 5.0mm · 0.47mm/px · z∈[-52,+98]mm · 2 of 18 slices shown (1 of 2)]
[im 1/18]
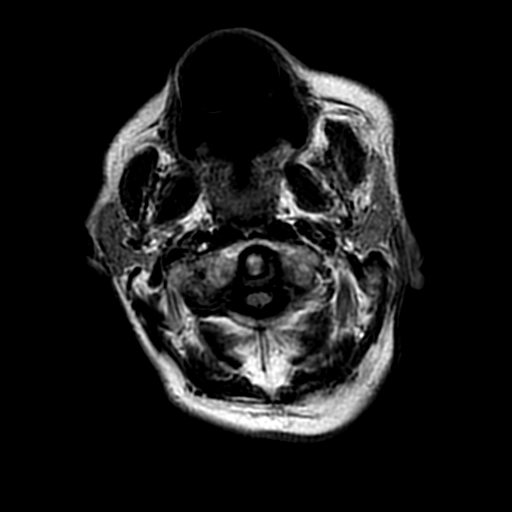
[im 18/18]
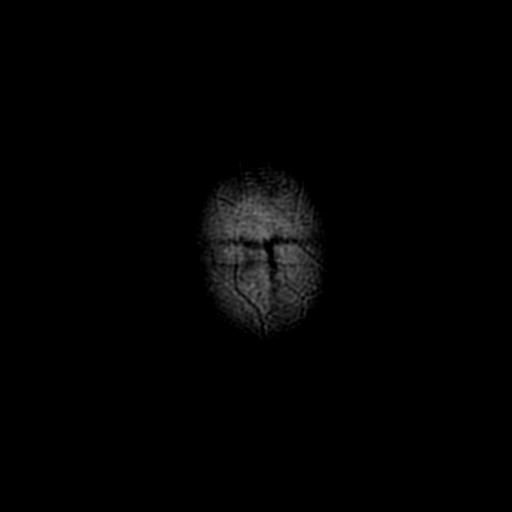

[Series 8: T2 · axial · 5.0mm · 0.47mm/px · z∈[-52,+104]mm · 3 of 27 slices shown (1 of 2)]
[im 1/27]
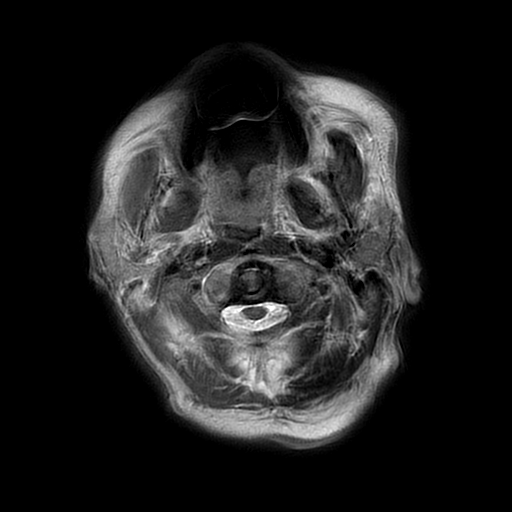
[im 14/27]
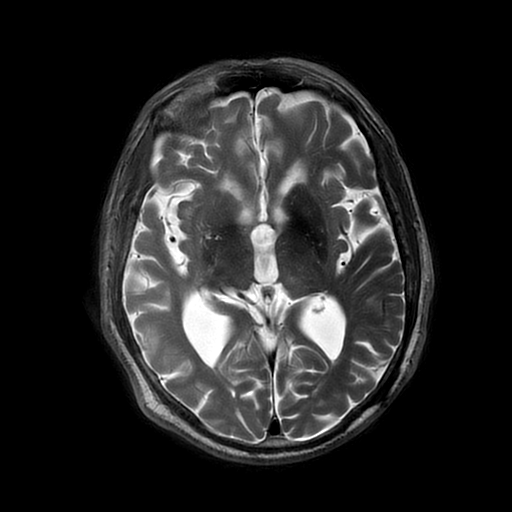
[im 27/27]
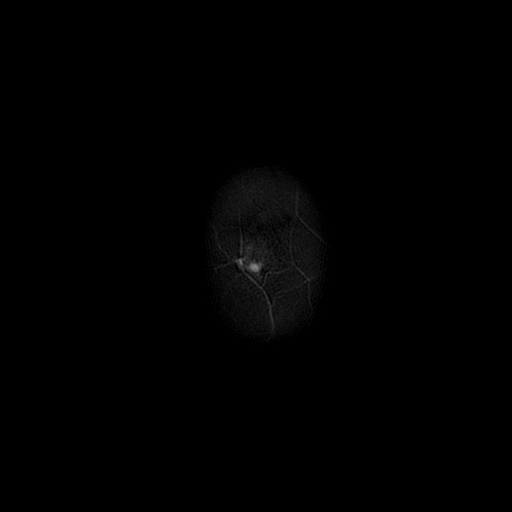

[Series 11: FLAIR · sagittal · 5.0mm · 0.47mm/px · 3 of 23 slices shown (2 of 2)]
[im 1/23]
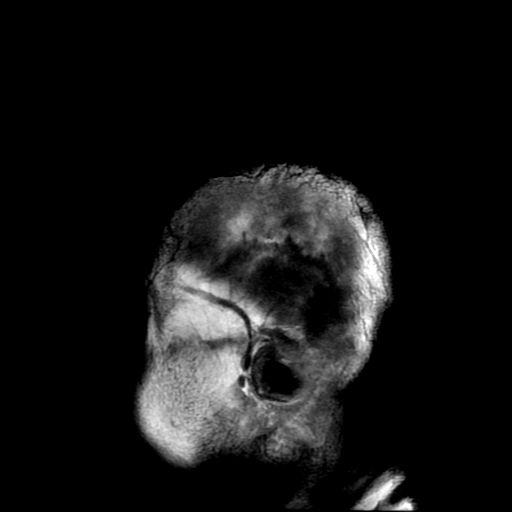
[im 12/23]
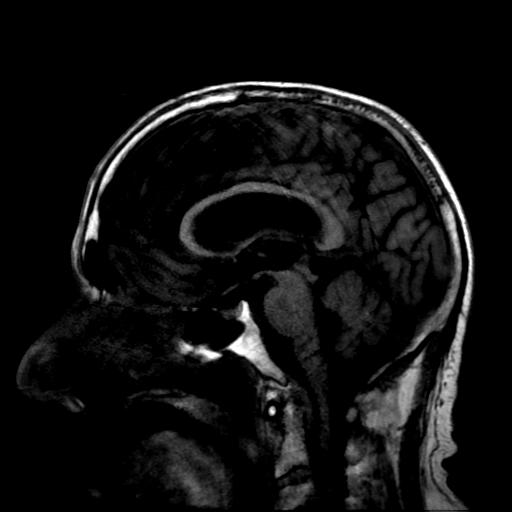
[im 23/23]
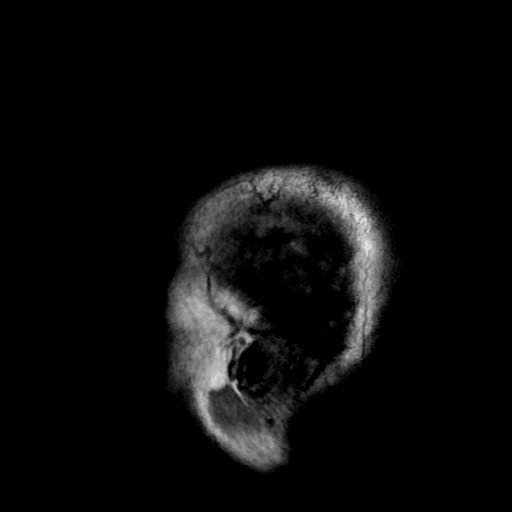

[Series 12: T2 · coronal · 5.0mm · 0.47mm/px · 2 of 26 slices shown (2 of 2)]
[im 1/26]
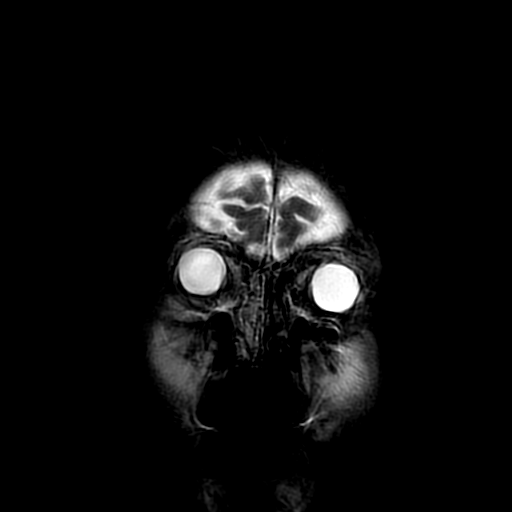
[im 13/26]
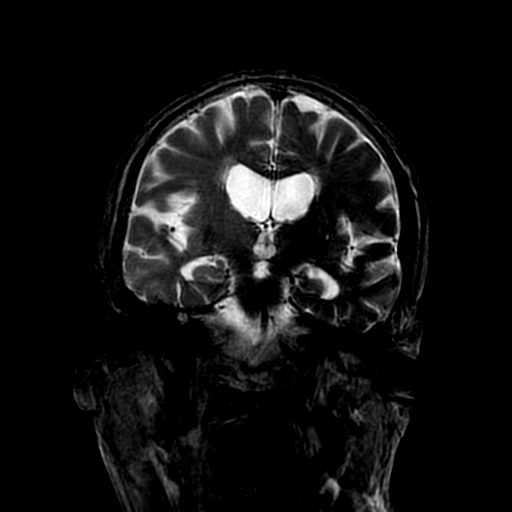

[Series 350: ADC · axial · 3.0mm · 0.94mm/px · z∈[-46,+101]mm · 6 of 50 slices shown (1 of 2)]
[im 1/50]
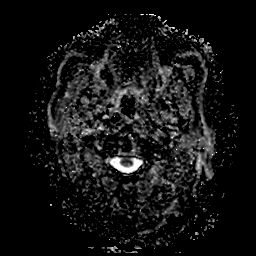
[im 10/50]
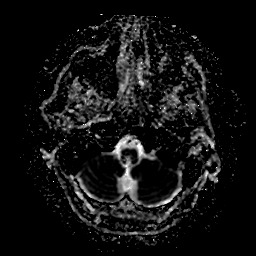
[im 20/50]
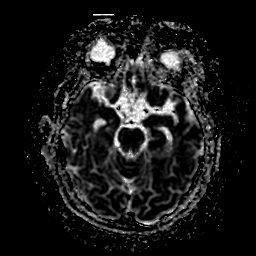
[im 30/50]
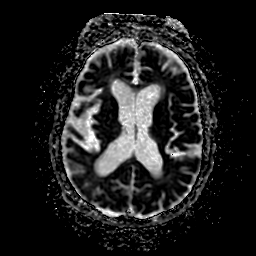
[im 40/50]
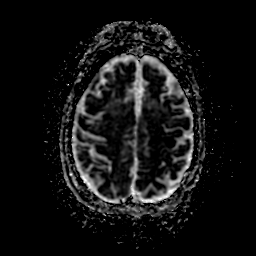
[im 50/50]
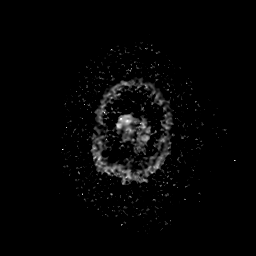

[Series 450: ADC · coronal · 4.0mm · 0.94mm/px · 4 of 36 slices shown (2 of 2)]
[im 1/36]
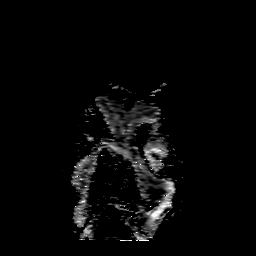
[im 12/36]
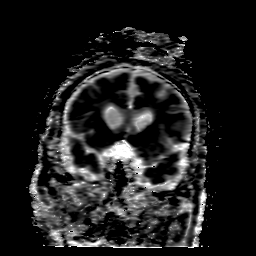
[im 24/36]
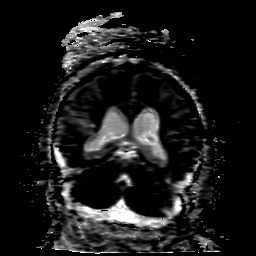
[im 36/36]
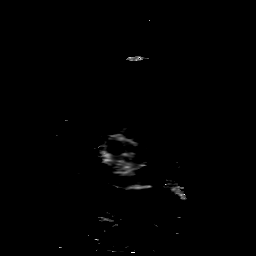

[38 of 48 positions shown; findings below may reference images not displayed]

FINDINGS: Brain: Stable cerebral volume since the prior MRI. No restricted
diffusion to suggest acute infarction. No midline shift, mass
effect, evidence of mass lesion, ventriculomegaly, extra-axial
collection or acute intracranial hemorrhage. Cervicomedullary
junction and pituitary are within normal limits.

Stable gray-white matter differentiation throughout the brain.
Prominent mineralization of the bilateral basal ganglia. Mild for
age patchy cerebral white matter T2 and FLAIR hyperintensity. No
cortical encephalomalacia or definite chronic cerebral blood
products identified. No new signal abnormality.

Vascular: Major intracranial vascular flow voids are stable since
4740.

Skull and upper cervical spine: Chronic ligamentous hypertrophy
about the odontoid. Normal visible bone marrow signal.

Sinuses/Orbits: Stable and negative.

Other: Mastoid air cells remain clear. Visible internal auditory
structures appear normal. Scalp and face soft tissues appear
negative.
IMPRESSION: No acute intracranial abnormality and stable MRI appearance of the
brain since [DATE].

## 2020-03-06 ENCOUNTER — Emergency Department (HOSPITAL_COMMUNITY): Payer: Medicare Other

## 2020-03-06 ENCOUNTER — Inpatient Hospital Stay (HOSPITAL_COMMUNITY)
Admission: EM | Admit: 2020-03-06 | Discharge: 2020-03-14 | DRG: 061 | Disposition: A | Discharge status: Skilled Nursing Facility | Payer: Medicare Other | Attending: Neurology | Admitting: Neurology

## 2020-03-06 ENCOUNTER — Inpatient Hospital Stay (HOSPITAL_COMMUNITY): Payer: Medicare Other

## 2020-03-06 ENCOUNTER — Encounter (HOSPITAL_COMMUNITY): Payer: Self-pay | Admitting: Neurology

## 2020-03-06 DIAGNOSIS — I739 Peripheral vascular disease, unspecified: Secondary | ICD-10-CM | POA: Diagnosis present

## 2020-03-06 DIAGNOSIS — Y900 Blood alcohol level of less than 20 mg/100 ml: Secondary | ICD-10-CM | POA: Diagnosis present

## 2020-03-06 DIAGNOSIS — R414 Neurologic neglect syndrome: Secondary | ICD-10-CM | POA: Diagnosis present

## 2020-03-06 DIAGNOSIS — I444 Left anterior fascicular block: Secondary | ICD-10-CM | POA: Diagnosis present

## 2020-03-06 DIAGNOSIS — Z8719 Personal history of other diseases of the digestive system: Secondary | ICD-10-CM

## 2020-03-06 DIAGNOSIS — Z801 Family history of malignant neoplasm of trachea, bronchus and lung: Secondary | ICD-10-CM

## 2020-03-06 DIAGNOSIS — Z833 Family history of diabetes mellitus: Secondary | ICD-10-CM

## 2020-03-06 DIAGNOSIS — R410 Disorientation, unspecified: Secondary | ICD-10-CM | POA: Diagnosis present

## 2020-03-06 DIAGNOSIS — I480 Paroxysmal atrial fibrillation: Secondary | ICD-10-CM | POA: Diagnosis present

## 2020-03-06 DIAGNOSIS — R5383 Other fatigue: Secondary | ICD-10-CM | POA: Diagnosis present

## 2020-03-06 DIAGNOSIS — E7800 Pure hypercholesterolemia, unspecified: Secondary | ICD-10-CM | POA: Diagnosis not present

## 2020-03-06 DIAGNOSIS — G40909 Epilepsy, unspecified, not intractable, without status epilepticus: Secondary | ICD-10-CM | POA: Diagnosis present

## 2020-03-06 DIAGNOSIS — E1151 Type 2 diabetes mellitus with diabetic peripheral angiopathy without gangrene: Secondary | ICD-10-CM | POA: Diagnosis present

## 2020-03-06 DIAGNOSIS — F0280 Dementia in other diseases classified elsewhere without behavioral disturbance: Secondary | ICD-10-CM | POA: Diagnosis present

## 2020-03-06 DIAGNOSIS — R2990 Unspecified symptoms and signs involving the nervous system: Secondary | ICD-10-CM | POA: Diagnosis present

## 2020-03-06 DIAGNOSIS — E119 Type 2 diabetes mellitus without complications: Secondary | ICD-10-CM

## 2020-03-06 DIAGNOSIS — D72829 Elevated white blood cell count, unspecified: Secondary | ICD-10-CM

## 2020-03-06 DIAGNOSIS — I6389 Other cerebral infarction: Secondary | ICD-10-CM | POA: Diagnosis not present

## 2020-03-06 DIAGNOSIS — R569 Unspecified convulsions: Secondary | ICD-10-CM | POA: Diagnosis present

## 2020-03-06 DIAGNOSIS — E1122 Type 2 diabetes mellitus with diabetic chronic kidney disease: Secondary | ICD-10-CM | POA: Diagnosis present

## 2020-03-06 DIAGNOSIS — I10 Essential (primary) hypertension: Secondary | ICD-10-CM | POA: Diagnosis not present

## 2020-03-06 DIAGNOSIS — I441 Atrioventricular block, second degree: Secondary | ICD-10-CM | POA: Diagnosis present

## 2020-03-06 DIAGNOSIS — F5104 Psychophysiologic insomnia: Secondary | ICD-10-CM | POA: Diagnosis present

## 2020-03-06 DIAGNOSIS — I482 Chronic atrial fibrillation, unspecified: Secondary | ICD-10-CM | POA: Diagnosis not present

## 2020-03-06 DIAGNOSIS — K219 Gastro-esophageal reflux disease without esophagitis: Secondary | ICD-10-CM | POA: Diagnosis present

## 2020-03-06 DIAGNOSIS — R001 Bradycardia, unspecified: Secondary | ICD-10-CM | POA: Diagnosis present

## 2020-03-06 DIAGNOSIS — F17210 Nicotine dependence, cigarettes, uncomplicated: Secondary | ICD-10-CM | POA: Diagnosis present

## 2020-03-06 DIAGNOSIS — I63511 Cerebral infarction due to unspecified occlusion or stenosis of right middle cerebral artery: Secondary | ICD-10-CM | POA: Diagnosis present

## 2020-03-06 DIAGNOSIS — Z20822 Contact with and (suspected) exposure to covid-19: Secondary | ICD-10-CM | POA: Diagnosis present

## 2020-03-06 DIAGNOSIS — R29712 NIHSS score 12: Secondary | ICD-10-CM | POA: Diagnosis present

## 2020-03-06 DIAGNOSIS — I4821 Permanent atrial fibrillation: Secondary | ICD-10-CM | POA: Diagnosis not present

## 2020-03-06 DIAGNOSIS — Z9114 Patient's other noncompliance with medication regimen: Secondary | ICD-10-CM

## 2020-03-06 DIAGNOSIS — J449 Chronic obstructive pulmonary disease, unspecified: Secondary | ICD-10-CM | POA: Diagnosis present

## 2020-03-06 DIAGNOSIS — N1832 Chronic kidney disease, stage 3b: Secondary | ICD-10-CM | POA: Diagnosis present

## 2020-03-06 DIAGNOSIS — E785 Hyperlipidemia, unspecified: Secondary | ICD-10-CM | POA: Diagnosis present

## 2020-03-06 DIAGNOSIS — E43 Unspecified severe protein-calorie malnutrition: Secondary | ICD-10-CM | POA: Diagnosis present

## 2020-03-06 DIAGNOSIS — Z7982 Long term (current) use of aspirin: Secondary | ICD-10-CM

## 2020-03-06 DIAGNOSIS — Z8673 Personal history of transient ischemic attack (TIA), and cerebral infarction without residual deficits: Secondary | ICD-10-CM

## 2020-03-06 DIAGNOSIS — H9190 Unspecified hearing loss, unspecified ear: Secondary | ICD-10-CM | POA: Diagnosis present

## 2020-03-06 DIAGNOSIS — R1310 Dysphagia, unspecified: Secondary | ICD-10-CM | POA: Diagnosis present

## 2020-03-06 DIAGNOSIS — I639 Cerebral infarction, unspecified: Secondary | ICD-10-CM

## 2020-03-06 DIAGNOSIS — I4891 Unspecified atrial fibrillation: Secondary | ICD-10-CM | POA: Diagnosis not present

## 2020-03-06 DIAGNOSIS — Z79899 Other long term (current) drug therapy: Secondary | ICD-10-CM

## 2020-03-06 DIAGNOSIS — G9341 Metabolic encephalopathy: Secondary | ICD-10-CM | POA: Diagnosis not present

## 2020-03-06 DIAGNOSIS — R1312 Dysphagia, oropharyngeal phase: Secondary | ICD-10-CM | POA: Diagnosis not present

## 2020-03-06 DIAGNOSIS — R29810 Facial weakness: Secondary | ICD-10-CM | POA: Diagnosis present

## 2020-03-06 DIAGNOSIS — I1310 Hypertensive heart and chronic kidney disease without heart failure, with stage 1 through stage 4 chronic kidney disease, or unspecified chronic kidney disease: Secondary | ICD-10-CM | POA: Diagnosis present

## 2020-03-06 DIAGNOSIS — G459 Transient cerebral ischemic attack, unspecified: Secondary | ICD-10-CM | POA: Diagnosis not present

## 2020-03-06 DIAGNOSIS — I119 Hypertensive heart disease without heart failure: Secondary | ICD-10-CM | POA: Diagnosis not present

## 2020-03-06 DIAGNOSIS — K5730 Diverticulosis of large intestine without perforation or abscess without bleeding: Secondary | ICD-10-CM | POA: Diagnosis present

## 2020-03-06 DIAGNOSIS — F1010 Alcohol abuse, uncomplicated: Secondary | ICD-10-CM | POA: Diagnosis present

## 2020-03-06 DIAGNOSIS — E1159 Type 2 diabetes mellitus with other circulatory complications: Secondary | ICD-10-CM | POA: Diagnosis not present

## 2020-03-06 LAB — COMPREHENSIVE METABOLIC PANEL
ALT: 9 U/L (ref 0–44)
AST: 20 U/L (ref 15–41)
Albumin: 3.7 g/dL (ref 3.5–5.0)
Alkaline Phosphatase: 79 U/L (ref 38–126)
Anion gap: 10 (ref 5–15)
BUN: 14 mg/dL (ref 8–23)
CO2: 20 mmol/L — ABNORMAL LOW (ref 22–32)
Calcium: 9.1 mg/dL (ref 8.9–10.3)
Chloride: 106 mmol/L (ref 98–111)
Creatinine, Ser: 1.75 mg/dL — ABNORMAL HIGH (ref 0.61–1.24)
GFR calc Af Amer: 42 mL/min — ABNORMAL LOW (ref >60)
GFR calc non Af Amer: 36 mL/min — ABNORMAL LOW (ref >60)
Glucose, Bld: 122 mg/dL — ABNORMAL HIGH (ref 70–99)
Potassium: 4.9 mmol/L (ref 3.5–5.1)
Sodium: 136 mmol/L (ref 135–145)
Total Bilirubin: 0.9 mg/dL (ref 0.3–1.2)
Total Protein: 6.8 g/dL (ref 6.5–8.1)

## 2020-03-06 LAB — APTT: aPTT: 27 seconds (ref 24–36)

## 2020-03-06 LAB — PROTIME-INR
INR: 1 (ref 0.8–1.2)
Prothrombin Time: 12.7 seconds (ref 11.4–15.2)

## 2020-03-06 LAB — CBG MONITORING, ED: Glucose-Capillary: 114 mg/dL — ABNORMAL HIGH (ref 70–99)

## 2020-03-06 LAB — CBC
HCT: 42 % (ref 39.0–52.0)
Hemoglobin: 13.5 g/dL (ref 13.0–17.0)
MCH: 30.6 pg (ref 26.0–34.0)
MCHC: 32.1 g/dL (ref 30.0–36.0)
MCV: 95.2 fL (ref 80.0–100.0)
Platelets: 233 10*3/uL (ref 150–400)
RBC: 4.41 MIL/uL (ref 4.22–5.81)
RDW: 14.6 % (ref 11.5–15.5)
WBC: 15.1 10*3/uL — ABNORMAL HIGH (ref 4.0–10.5)
nRBC: 0 % (ref 0.0–0.2)

## 2020-03-06 LAB — I-STAT CHEM 8, ED
BUN: 15 mg/dL (ref 8–23)
Calcium, Ion: 1.04 mmol/L — ABNORMAL LOW (ref 1.15–1.40)
Chloride: 105 mmol/L (ref 98–111)
Creatinine, Ser: 1.7 mg/dL — ABNORMAL HIGH (ref 0.61–1.24)
Glucose, Bld: 122 mg/dL — ABNORMAL HIGH (ref 70–99)
HCT: 40 % (ref 39.0–52.0)
Hemoglobin: 13.6 g/dL (ref 13.0–17.0)
Potassium: 4.7 mmol/L (ref 3.5–5.1)
Sodium: 136 mmol/L (ref 135–145)
TCO2: 21 mmol/L — ABNORMAL LOW (ref 22–32)

## 2020-03-06 LAB — DIFFERENTIAL
Abs Immature Granulocytes: 0.07 10*3/uL (ref 0.00–0.07)
Basophils Absolute: 0.1 10*3/uL (ref 0.0–0.1)
Basophils Relative: 0 %
Eosinophils Absolute: 0 10*3/uL (ref 0.0–0.5)
Eosinophils Relative: 0 %
Immature Granulocytes: 1 %
Lymphocytes Relative: 6 %
Lymphs Abs: 1 10*3/uL (ref 0.7–4.0)
Monocytes Absolute: 0.9 10*3/uL (ref 0.1–1.0)
Monocytes Relative: 6 %
Neutro Abs: 13.1 10*3/uL — ABNORMAL HIGH (ref 1.7–7.7)
Neutrophils Relative %: 87 %

## 2020-03-06 LAB — MRSA PCR SCREENING: MRSA by PCR: NEGATIVE

## 2020-03-06 LAB — ETHANOL: Alcohol, Ethyl (B): <10 mg/dL (ref <10)

## 2020-03-06 LAB — SARS CORONAVIRUS 2 BY RT PCR (HOSPITAL ORDER, PERFORMED IN ~~LOC~~ HOSPITAL LAB): SARS Coronavirus 2: NEGATIVE

## 2020-03-06 MED ORDER — ALTEPLASE (STROKE) FULL DOSE INFUSION
0.9000 mg/kg | Freq: Once | INTRAVENOUS | Status: AC
  Administered 2020-03-06: 57.9 mg via INTRAVENOUS
  Filled 2020-03-06: qty 100

## 2020-03-06 MED ORDER — PANTOPRAZOLE SODIUM 40 MG IV SOLR
40.0000 mg | Freq: Every day | INTRAVENOUS | Status: DC
  Administered 2020-03-06 – 2020-03-07 (×2): 40 mg via INTRAVENOUS
  Filled 2020-03-06 (×2): qty 40

## 2020-03-06 MED ORDER — LORAZEPAM 2 MG/ML IJ SOLN: 1.0000 mg | Freq: Once | INTRAMUSCULAR | Status: AC

## 2020-03-06 MED ORDER — LEVETIRACETAM IN NACL 500 MG/100ML IV SOLN
500.0000 mg | Freq: Two times a day (BID) | INTRAVENOUS | Status: DC
  Administered 2020-03-07: 500 mg via INTRAVENOUS
  Filled 2020-03-06: qty 100

## 2020-03-06 MED ORDER — ORAL CARE MOUTH RINSE
15.0000 mL | Freq: Two times a day (BID) | OROMUCOSAL | Status: DC
  Administered 2020-03-06 – 2020-03-14 (×13): 15 mL via OROMUCOSAL

## 2020-03-06 MED ORDER — ACETAMINOPHEN 325 MG PO TABS: 650.0000 mg | ORAL_TABLET | ORAL | Status: DC | PRN

## 2020-03-06 MED ORDER — SODIUM CHLORIDE 0.9 % IV SOLN
50.0000 mL/h | INTRAVENOUS | Status: DC
  Administered 2020-03-06 – 2020-03-10 (×3): 50 mL/h via INTRAVENOUS

## 2020-03-06 MED ORDER — THIAMINE HCL 100 MG/ML IJ SOLN
100.0000 mg | Freq: Every day | INTRAMUSCULAR | Status: DC
  Administered 2020-03-06 – 2020-03-08 (×3): 100 mg via INTRAVENOUS
  Filled 2020-03-06 (×3): qty 2

## 2020-03-06 MED ORDER — ACETAMINOPHEN 650 MG RE SUPP: 650.0000 mg | RECTAL | Status: DC | PRN

## 2020-03-06 MED ORDER — ACETAMINOPHEN 160 MG/5ML PO SOLN: 650.0000 mg | ORAL | Status: DC | PRN

## 2020-03-06 MED ORDER — LABETALOL HCL 5 MG/ML IV SOLN
10.0000 mg | Freq: Once | INTRAVENOUS | Status: AC | PRN
  Administered 2020-03-06: 10 mg via INTRAVENOUS
  Filled 2020-03-06: qty 4

## 2020-03-06 MED ORDER — NICARDIPINE HCL IN NACL 20-0.86 MG/200ML-% IV SOLN
0.0000 mg/h | INTRAVENOUS | Status: DC | PRN
  Administered 2020-03-06: 5 mg/h via INTRAVENOUS
  Administered 2020-03-06: 2.5 mg/h via INTRAVENOUS
  Filled 2020-03-06: qty 400
  Filled 2020-03-06: qty 200

## 2020-03-06 MED ORDER — SODIUM CHLORIDE 0.9 % IV SOLN
50.0000 mL | Freq: Once | INTRAVENOUS | Status: AC
  Administered 2020-03-06: 50 mL via INTRAVENOUS

## 2020-03-06 MED ORDER — STROKE: EARLY STAGES OF RECOVERY BOOK
Freq: Once | Status: DC
  Filled 2020-03-06: qty 1

## 2020-03-06 MED ORDER — LEVETIRACETAM IN NACL 1000 MG/100ML IV SOLN
1000.0000 mg | Freq: Once | INTRAVENOUS | Status: AC
  Administered 2020-03-06: 1000 mg via INTRAVENOUS
  Filled 2020-03-06: qty 100

## 2020-03-06 MED ORDER — ATORVASTATIN CALCIUM 10 MG PO TABS
20.0000 mg | ORAL_TABLET | Freq: Every day | ORAL | Status: DC
  Administered 2020-03-07 – 2020-03-14 (×8): 20 mg via ORAL
  Filled 2020-03-06 (×9): qty 2

## 2020-03-06 MED ORDER — LORAZEPAM 2 MG/ML IJ SOLN
INTRAMUSCULAR | Status: AC
  Administered 2020-03-06: 1 mg via INTRAVENOUS
  Filled 2020-03-06: qty 1

## 2020-03-06 MED ORDER — IOHEXOL 350 MG/ML SOLN
100.0000 mL | Freq: Once | INTRAVENOUS | Status: AC | PRN
  Administered 2020-03-06: 100 mL via INTRAVENOUS

## 2020-03-06 NOTE — Progress Notes (Signed)
STAT EEG complete - results pending. ? ?

## 2020-03-06 NOTE — ED Provider Notes (Signed)
[facility] EMERGENCY DEPARTMENT Provider Note   CSN: [id number] Arrival date & time: 03/06/20  1647     History No chief complaint on file.   Blake [name] is a 79 y.o. male.  Patient with h/o CVA in 12/2018 (8 mm infarction within the right caudate head) on ASA, seizure disorder on Keppra, EtOH use -- presents to the ED as a Code Stroke.  Patient developed acute left-sided neglect after 3 PM today.  Level 5 caveat due to urgent need for intervention.        Past Medical History:  Diagnosis Date  . Chronic insomnia   . Cigarette smoker   . COPD (chronic obstructive pulmonary disease) ([place])   . Diverticulosis of colon   . Elevated prostate specific antigen (PSA)   . ETOH abuse   . GERD (gastroesophageal reflux disease)   . Hx of colonic polyps   . Hyperlipidemia   . Hypertension   . Pulmonary nodule   . Shortness of breath   . Stroke [facility])     Patient Active Problem List   Diagnosis Date Noted  . Acute CVA (cerebrovascular accident) ([place]) 01/10/2019  . Esophageal stricture   . Abnormal finding on GI tract imaging   . Dysphagia 07/20/2018  . Malnutrition of moderate degree 07/10/2018  . Aspiration pneumonia ([place]) 07/07/2018  . Seizure disorder ([place]) 07/07/2018  . HLD (hyperlipidemia) 07/07/2018  . Acute hypoxemic respiratory failure ([place]) 07/07/2018  . Encounter for intubation   . Acute respiratory failure with hypercapnia ([place])   . HCAP (healthcare-associated pneumonia)   . Protein-calorie malnutrition, severe 06/26/2018  . Alcohol withdrawal seizure ([place]) 06/25/2018  . New onset seizure ([place]) 01/07/2018  . Hypertension 01/07/2018  . GERD (gastroesophageal reflux disease) 01/07/2018  . Hyperlipidemia 01/07/2018  . Cigarette smoker 01/07/2018  . Alcohol abuse 01/07/2018  . Acute alcohol abuse, with delirium ([place])   . Acute encephalopathy 10/14/2014  . Unsteady gait 10/14/2014  . Alcohol withdrawal delirium ([place]) 10/14/2014  . Lipoma of  neck 12/22/2012  . Cerebral atrophy ([place]) 09/28/2012  . Cerebrovascular small vessel disease 09/28/2012  . Left humeral fracture 07/04/2012  . Delirium 07/04/2012  . ETOH abuse   . ELEVATED PROSTATE SPECIFIC ANTIGEN 09/30/2010  . COPD (chronic obstructive pulmonary disease) ([place]) 01/28/2009  . PULMONARY NODULE 01/28/2009  . INSOMNIA, CHRONIC 10/15/2007  . COLONIC POLYPS 10/10/2007  . Type 2 diabetes mellitus ([place]) 10/10/2007  . Dyslipidemia 10/10/2007  . Essential hypertension 10/10/2007  . GERD 10/10/2007  . DIVERTICULOSIS OF COLON 10/10/2007    Past Surgical History:  Procedure Laterality Date  . COLONOSCOPY    . ESOPHAGOGASTRODUODENOSCOPY (EGD) WITH PROPOFOL N/A 07/21/2018   Procedure: ESOPHAGOGASTRODUODENOSCOPY (EGD) WITH PROPOFOL;  Surgeon: Lavena Bullion, DO;  Location: [facility];  Service: Gastroenterology;  Laterality: N/A;  . needle biopsy RUL nodule  01/2009   benign  . TONSILLECTOMY  1947  . VASECTOMY         Family History  Problem Relation Age of Onset  . Diabetes Father   . Lung cancer Brother   . Colon cancer Neg Hx   . Esophageal cancer Neg Hx   . Stomach cancer Neg Hx     Social History   Tobacco Use  . Smoking status: Current Every Day Smoker    Packs/day: 1.00    Years: 55.00    Pack years: 55.00    Types: Cigarettes  . Smokeless tobacco: Never Used  . Tobacco comment: not smoking @ SNF  Substance Use Topics  . Alcohol use: Yes    Alcohol/week: 24.0 standard drinks    Types: 24 Cans of beer per week    Comment: 3-4 daily  . Drug use: No    Home Medications Prior to Admission medications   Medication Sig Start Date End Date Taking? Authorizing Provider  aspirin 325 MG tablet Take 1 tablet (325 mg total) by mouth daily with breakfast. 01/13/19   Denton Brick, Courage, MD  atorvastatin (LIPITOR) 20 MG tablet Take 1 tablet (20 mg total) by mouth daily. 01/13/19 01/13/20  Roxan Hockey, MD  Cholecalciferol (VITAMIN D-3) 25 MCG (1000 UT)  CAPS Take 1 capsule (1,000 Units total) by mouth daily. 08/11/18   Medina-Vargas, Monina C, NP  folic acid (FOLVITE) 1 MG tablet Take 1 tablet (1 mg total) by mouth daily. 01/13/19   Roxan Hockey, MD  gemfibrozil (LOPID) 600 MG tablet Take 1 tablet (600 mg total) by mouth 2 (two) times daily. 08/11/18   Medina-Vargas, Monina C, NP  ipratropium-albuterol (DUONEB) 0.5-2.5 (3) MG/3ML SOLN Take 3 mLs by nebulization See admin instructions. Nebulize and inhale 3 ml's into the lungs at 6 AM, 2 PM, and 10 PM for 5 days with a start date of 07/06/18 AND 3 ml's every six hours as needed for coughing 08/11/18   Medina-Vargas, Monina C, NP  levETIRAcetam (KEPPRA) 500 MG tablet Take 1 tablet (500 mg total) by mouth 2 (two) times daily. 08/11/18   Medina-Vargas, Monina C, NP  Multiple Vitamin (MULTIVITAMIN WITH MINERALS) TABS tablet Take 1 tablet by mouth daily. 01/14/19   Roxan Hockey, MD  QUEtiapine (SEROQUEL) 25 MG tablet Take 1 tablet (25 mg total) by mouth at bedtime. 01/13/19   Roxan Hockey, MD  thiamine 100 MG tablet Take 1 tablet (100 mg total) by mouth daily. 08/11/18   Medina-Vargas, Monina C, NP    Allergies    Patient has no known allergies.  Review of Systems   Review of Systems  Unable to perform ROS: Acuity of condition    Physical Exam Updated Vital Signs BP (!) 173/103   Pulse 89   Resp 15   Wt 64.3 kg   BMI 20.34 kg/m   Physical Exam Vitals and nursing note reviewed.  Constitutional:      Appearance: He is well-developed.  HENT:     Head: Normocephalic and atraumatic.  Eyes:     Conjunctiva/sclera: Conjunctivae normal.  Cardiovascular:     Rate and Rhythm: Normal rate and regular rhythm.  Pulmonary:     Effort: No respiratory distress.     Comments: Patient with normal speech.  Normal respirations.  Lung sounds clear.  He is protecting his airway. Musculoskeletal:     Cervical back: Normal range of motion and neck supple.  Skin:    General: Skin is warm and  dry.  Neurological:     Mental Status: He is alert.     Comments: Left gaze deviation. Normal speech. Complete neuro exam deferred to stroke team.      ED Results / Procedures / Treatments   Labs (all labs ordered are listed, but only abnormal results are displayed) Labs Reviewed  CBC - Abnormal; Notable for the following components:      Result Value   WBC 15.1 (*)    All other components within normal limits  DIFFERENTIAL - Abnormal; Notable for the following components:   Neutro Abs 13.1 (*)    All other components within normal limits  COMPREHENSIVE METABOLIC PANEL -  Abnormal; Notable for the following components:   CO2 20 (*)    Glucose, Bld 122 (*)    Creatinine, Ser 1.75 (*)    GFR calc non Af Amer 36 (*)    GFR calc Af Amer 42 (*)    All other components within normal limits  I-STAT CHEM 8, ED - Abnormal; Notable for the following components:   Creatinine, Ser 1.70 (*)    Glucose, Bld 122 (*)    Calcium, Ion 1.04 (*)    TCO2 21 (*)    All other components within normal limits  CBG MONITORING, ED - Abnormal; Notable for the following components:   Glucose-Capillary 114 (*)    All other components within normal limits  ETHANOL  PROTIME-INR  APTT  RAPID URINE DRUG SCREEN, HOSP PERFORMED  URINALYSIS, ROUTINE W REFLEX MICROSCOPIC  CBC  HEMOGLOBIN A1C  LIPID PANEL    ED ECG REPORT   Date: 03/06/2020  Rate: 87  Rhythm: normal sinus rhythm  QRS Axis: left  Intervals: normal  ST/T Wave abnormalities: nonspecific T wave changes  Conduction Disutrbances:sinus pause  Narrative Interpretation:   Old EKG Reviewed: changes noted  I have personally reviewed the EKG tracing and agree with the computerized printout as noted.  Radiology CT HEAD CODE STROKE WO CONTRAST  Result Date: 03/06/2020 CLINICAL DATA:  Code stroke.  79 year old male EXAM: CT HEAD WITHOUT CONTRAST TECHNIQUE: Contiguous axial images were obtained from the base of the skull through the vertex  without intravenous contrast. COMPARISON:  Brain MRI 01/10/2019. Head CT 06/25/2018. CTA head and neck 01/10/2019. FINDINGS: Brain: Patchy and confluent bilateral cerebral white matter hypodensity appears progressed since 2020. Cerebral volume is not significantly changed. No midline shift, mass effect, or evidence of intracranial mass lesion. Mild ventriculomegaly, probably ex vacuo. No acute intracranial hemorrhage identified. No cortically based acute infarct identified. No cortical encephalomalacia identified. Vascular: Calcified atherosclerosis at the skull base. No suspicious intracranial vascular hyperdensity. Skull: Osteopenia.  No acute osseous abnormality identified. Sinuses/Orbits: Mild new right sphenoid sinus mucosal thickening, otherwise stable and well pneumatized. Other: No acute orbit or scalp soft tissue findings. ASPECTS [facility] Stroke Program Early CT Score) Total score (0-10 with 10 being normal): 10 IMPRESSION: 1. No acute cortically based infarct or acute intracranial hemorrhage identified. ASPECTS 10. 2. Progressed bilateral cerebral white matter disease since 2020. 3. These results were communicated to Dr. Leonel Ramsay at 5:10 pm on 6/10/2021by text page via the [facility] messaging system. Electronically Signed   By: Genevie Ann M.D.   On: 03/06/2020 17:10    Procedures Procedures (including critical care time)  Medications Ordered in ED Medications - No data to display  ED Course  I have reviewed the triage vital signs and the nursing notes.  Pertinent labs & imaging results that were available during my care of the patient were reviewed by me and considered in my medical decision making (see chart for details).  5:07 PM Patient not in room.   5:15 PM Head CT results reviewed.   5:18 PM Not in room.   Patient examined briefly.  Patient was undergoing full neurologic assessment by stroke team.  TPA had already been started.  Plan for admission to neuro ICU.  6:49 PM Pt stable.       MDM Rules/Calculators/A&P                          Admit.    Final Clinical Impression(s) / ED Diagnoses  Final diagnoses:  Cerebrovascular accident (CVA), unspecified mechanism [facility])    Rx / [place] Orders ED Discharge Orders    None       Carlisle Cater, PA-C 03/06/20 1850    Maudie Flakes, MD 03/07/20 1605

## 2020-03-06 NOTE — H&P (Signed)
Neurology H&P  CC: Left-sided weakness  History is obtained from: EMS  HPI: Blake [name] is a 79 y.o. male with a history of atrial fibrillation, not currently taking any medications who presents with left-sided weakness that started abruptly.  Family talked to him on the phone around 3 PM, and at that time he was normal, no concerns based on the conversation.  Subsequently, they found him few hours later on the floor, though it did not appear that he had fallen.  He was brought into the emergency department as a code stroke.   LKW: 3 PM tpa given?:  Yes IR Thrombectomy? No, no LVO identified   ROS: A complete ROS was performed and is negative except as noted in the HPI.   Past Medical History:  Diagnosis Date  . Chronic insomnia   . Cigarette smoker   . COPD (chronic obstructive pulmonary disease) ([place])   . Diverticulosis of colon   . Elevated prostate specific antigen (PSA)   . ETOH abuse   . GERD (gastroesophageal reflux disease)   . Hx of colonic polyps   . Hyperlipidemia   . Hypertension   . Pulmonary nodule   . Shortness of breath   . Stroke [facility])      Family History  Problem Relation Age of Onset  . Diabetes Father   . Lung cancer Brother   . Colon cancer Neg Hx   . Esophageal cancer Neg Hx   . Stomach cancer Neg Hx      Social History:  reports that he has been smoking cigarettes. He has a 55.00 pack-year smoking history. He has never used smokeless tobacco. He reports current alcohol use of about 24.0 standard drinks of alcohol per week. He reports that he does not use drugs.   Prior to Admission medications   Medication Sig Start Date End Date Taking? Authorizing Provider  aspirin 325 MG tablet Take 1 tablet (325 mg total) by mouth daily with breakfast. 01/13/19   Denton Brick, Courage, MD  atorvastatin (LIPITOR) 20 MG tablet Take 1 tablet (20 mg total) by mouth daily. 01/13/19 01/13/20  Roxan Hockey, MD  Cholecalciferol (VITAMIN D-3) 25 MCG (1000 UT) CAPS  Take 1 capsule (1,000 Units total) by mouth daily. 08/11/18   Medina-Vargas, Monina C, NP  folic acid (FOLVITE) 1 MG tablet Take 1 tablet (1 mg total) by mouth daily. 01/13/19   Roxan Hockey, MD  furosemide (LASIX) 20 MG tablet Take 20 mg by mouth daily. 10/04/19   [provider]  gemfibrozil (LOPID) 600 MG tablet Take 1 tablet (600 mg total) by mouth 2 (two) times daily. 08/11/18   Medina-Vargas, Monina C, NP  ipratropium-albuterol (DUONEB) 0.5-2.5 (3) MG/3ML SOLN Take 3 mLs by nebulization See admin instructions. Nebulize and inhale 3 ml's into the lungs at 6 AM, 2 PM, and 10 PM for 5 days with a start date of 07/06/18 AND 3 ml's every six hours as needed for coughing 08/11/18   Medina-Vargas, Monina C, NP  levETIRAcetam (KEPPRA) 500 MG tablet Take 1 tablet (500 mg total) by mouth 2 (two) times daily. 08/11/18   Medina-Vargas, Monina C, NP  Multiple Vitamin (MULTIVITAMIN WITH MINERALS) TABS tablet Take 1 tablet by mouth daily. 01/14/19   Roxan Hockey, MD  potassium chloride (KLOR-CON) 10 MEQ tablet Take 10 mEq by mouth daily. 10/04/19   [provider]  QUEtiapine (SEROQUEL) 25 MG tablet Take 1 tablet (25 mg total) by mouth at bedtime. 01/13/19   Roxan Hockey, MD  thiamine 100 MG tablet Take 1 tablet (100 mg total) by mouth daily. 08/11/18   Medina-Vargas, Monina C, NP     Exam: Current vital signs: BP 132/66   Pulse 76   Temp (!) 96.7 F (35.9 C)   Resp 18   Wt 64.3 kg   SpO2 97%   BMI 20.34 kg/m    Physical Exam  Constitutional: Appears well-developed and well-nourished.  Psych: Affect appropriate to situation Eyes: No scleral injection HENT: No OP obstrucion Head: Normocephalic.  Cardiovascular: Normal rate and regular rhythm.  Respiratory: Effort normal and breath sounds normal to anterior ascultation GI: Soft.  No distension. There is no tenderness.  Skin: WDI  Neuro: Mental Status: Patient is awake, alert, oriented to person, place, month, year,  and situation. Patient is able to give a clear and coherent history. No signs of aphasia, he does not appear to attend to the left side as well as right Cranial Nerves: II: Left hemianopia.  Right pupil is postsurgical (old problem) left pupil is reactive III,IV, VI: He does not cross midline to the left, right gaze preference but not forced deviation V: Facial sensation is symmetric to temperature VII: Facial movement with left facial weakness VIII: hearing is intact to voice X: Uvula elevates symmetrically XI: Shoulder shrug is symmetric. XII: tongue is midline without atrophy or fasciculations.  Motor:  5/5 strength was present on the left yes right, he has 4/5 weakness of the left arm with mildly increased tone.  He gives poor effort bilaterally, but weaker on the left.  Sensory: Sensation is diminished on the left.  Cerebellar: Does not perform.    I have reviewed labs in epic and the pertinent results are: Cr 1.75, cmp otherwise unremarkable.    I have reviewed the images obtained: mild leukocytosis  Primary Diagnosis:  Cerebral infarction, unspecified.  Secondary Diagnosis: Essential (primary) hypertension and CKD Stage 3 (GFR 30-59)   Impression: 79 year old male with acute onset left-sided weakness, left hemianopia and right gaze preference.  This is all most compatible with ischemic infarct in the right MCA distribution, however there is no clear LVO.  I do wonder about a broken up embolus therefore considered IV TPA.  With no other treatment options, even though he had a stroke in April, I feel that with the small size of the stroke it should not preclude treatment given the severity of his deficits.  I tried to reach family to discuss these things, however no answer was obtained on any numbers in the chart.  Plan: - HgbA1c, fasting lipid panel - MRI  of the brain without contrast - Frequent neuro checks - Echocardiogram - Prophylactic therapy-Antiplatelet med: none  for 24 hours - Risk factor modification - Telemetry monitoring - PT consult, OT consult, Speech consult -Thiamine given history of alcohol use - Stroke team to follow - cxr, ua - EEG   This patient is critically ill and at significant risk of neurological worsening, death and care requires constant monitoring of vital signs, hemodynamics,respiratory and cardiac monitoring, neurological assessment, discussion with family, other specialists and medical decision making of high complexity. I spent 60 minutes of neurocritical care time  in the care of  this patient. This was time spent independent of any time provided by nurse practitioner or PA.  Roland Rack, MD [facility] [phone]  If 7pm- 7am, please page neurology on call as listed in Mobile.

## 2020-03-06 NOTE — Code Documentation (Signed)
Stroke Response Nurse Documentation Code Documentation  Blake [name] is a 79 y.o. male arriving to [place] ED via [facility] on 03/06/2020 with past medical hx of CVA, HTN, HLD, Seizure, ETOH, and smoker. Code stroke was activated by EMS. Patient from home where he was LKW at 1500 when family spoke to him on the phone and now complaining of L side weakness and unequal pupils (R>L). On aspirin 325 mg daily, but non-compliant with medications per family. Stroke team at the bedside on patient arrival. Labs drawn and patient cleared for CT by Dr. Jeanell Sparrow. Patient to CT with team. NIHSS 13, see documentation for details and code stroke times. Patient with disoriented, left hemianopia, left facial droop, left arm weakness, left leg weakness and Sensory  neglect on exam. The following imaging was completed:  CT, CTA head and neck. Patient is a candidate for tPA, no LVO on CTA per Leonel Ramsay MD. Patient BP goals < 180/105, will be q94m mNIHSS/VS for 2 hours following tPA start time, followed by q71m for 6 hours, and then q1h for 16 hours. Patient will admit to ICU. Bedside handoff with ED RN [place]  Stroke Response RN

## 2020-03-06 NOTE — ED Triage Notes (Signed)
Pt bib ems from home where last known well 1500 today. Family states they spoke to him at that time and he was his normal self. Found on floor in hallway altered with L sided contracted. Pt pupils unequal. Repetitive questioning per ems. Pt non compliant with meds. Hx afib and DM.  168/116 HR afib 70's CBG 143

## 2020-03-06 NOTE — Procedures (Addendum)
Patient Name: Blake [name]  MRN: [id number] l Epilepsy Attending: Lora Havens  Referring Physician/Provider: Dr Roland Rack Date: 03/06/2020  Duration: 27.55 mins  Patient history: 78 y.o. male with a history of atrial fibrillation, not currently taking any medications who presents with left-sided weakness that started abruptly, left hemianopia and right gaze preference . EEG to evaluate for seizure.  Level of alertness:  lethargic  AEDs during EEG study: Keppra, ativan  Technical aspects: This EEG study was done with scalp electrodes positioned according to the 10-20 International system of electrode placement. Electrical activity was acquired at a sampling rate of 500Hz  and reviewed with a high frequency filter of 70Hz  and a low frequency filter of 1Hz . EEG data were recorded continuously and digitally stored.   Description: No clear posterior dominant rhythm was seen. EEG showed continuous 3-5hz  theta- delta slowing , at times rhythmic in right hemisphere as well as polymorphic mixed frequencies predominantly with 5-8Hz  theta-alpha activity as well as intermittent 2-3hz  delta activity in left hemisphere. Sharp transients were also noted in right temporal region. Hyperventilation and photic stimulation were not performed.     Of note, study was technically difficult due to significant movement artifact.   ABNORMALITY -Continuous slow, generalized and lateralized right hemisphere  IMPRESSION: This technically difficult study is suggestive of cortical dysfunction in right hemisphere likely secondary to underlying structural abnormality, post-ictal state. Additionally, there is evidence of moderate diffuse encephalopathy, nonspecific etiology.  No seizures or definite epileptiform discharges were seen throughout the recording.  If suspicion for interictal activity remains a concern, a prolonged study including sleep can be considered.    [name]

## 2020-03-06 NOTE — Progress Notes (Signed)
PHARMACIST CODE STROKE RESPONSE  Notified to mix tPA at 1715 by Dr. Leonel Ramsay Delivered tPA to RN at 1719  tPA dose = 5.8mg  bolus over 1 minute followed by 52.1mg  for a total dose of 57.9mg  over 1 hour  Issues/delays encountered (if applicable): none   Thank you,   Eddie Candle, PharmD PGY-1 Pharmacy Resident   Please check amion for clinical pharmacist contact number  03/06/20 5:24 PM

## 2020-03-07 ENCOUNTER — Other Ambulatory Visit: Payer: Self-pay

## 2020-03-07 ENCOUNTER — Inpatient Hospital Stay (HOSPITAL_COMMUNITY): Payer: Medicare Other

## 2020-03-07 DIAGNOSIS — I441 Atrioventricular block, second degree: Secondary | ICD-10-CM

## 2020-03-07 DIAGNOSIS — I6389 Other cerebral infarction: Secondary | ICD-10-CM

## 2020-03-07 LAB — CBC
HCT: 39.9 % (ref 39.0–52.0)
Hemoglobin: 13.1 g/dL (ref 13.0–17.0)
MCH: 30.7 pg (ref 26.0–34.0)
MCHC: 32.8 g/dL (ref 30.0–36.0)
MCV: 93.4 fL (ref 80.0–100.0)
Platelets: 239 10*3/uL (ref 150–400)
RBC: 4.27 MIL/uL (ref 4.22–5.81)
RDW: 14.6 % (ref 11.5–15.5)
WBC: 10.4 10*3/uL (ref 4.0–10.5)
nRBC: 0 % (ref 0.0–0.2)

## 2020-03-07 LAB — LIPID PANEL
Cholesterol: 147 mg/dL (ref 0–200)
HDL: 47 mg/dL (ref >40)
LDL Cholesterol: 84 mg/dL (ref 0–99)
Total CHOL/HDL Ratio: 3.1 RATIO
Triglycerides: 78 mg/dL (ref <150)
VLDL: 16 mg/dL (ref 0–40)

## 2020-03-07 LAB — ECHOCARDIOGRAM COMPLETE
Height: 70 in
Weight: 2268.09 oz

## 2020-03-07 LAB — HEMOGLOBIN A1C
Hgb A1c MFr Bld: 5.3 % (ref 4.8–5.6)
Mean Plasma Glucose: 105.41 mg/dL

## 2020-03-07 MED ORDER — LEVETIRACETAM ER 500 MG PO TB24
1000.0000 mg | ORAL_TABLET | Freq: Every day | ORAL | Status: DC
  Administered 2020-03-07 – 2020-03-09 (×3): 1000 mg via ORAL
  Filled 2020-03-07 (×4): qty 2

## 2020-03-07 MED ORDER — LEVETIRACETAM ER 500 MG PO TB24: 500.0000 mg | ORAL_TABLET | Freq: Every day | ORAL | Status: DC

## 2020-03-07 MED ORDER — LORAZEPAM 2 MG/ML IJ SOLN
1.0000 mg | INTRAMUSCULAR | Status: DC | PRN
  Administered 2020-03-07: 2 mg via INTRAVENOUS
  Administered 2020-03-07: 1 mg via INTRAVENOUS
  Filled 2020-03-07 (×3): qty 1

## 2020-03-07 MED ORDER — CHLORHEXIDINE GLUCONATE CLOTH 2 % EX PADS
6.0000 | MEDICATED_PAD | Freq: Every day | CUTANEOUS | Status: DC
  Administered 2020-03-07 – 2020-03-13 (×7): 6 via TOPICAL

## 2020-03-07 MED ORDER — PERFLUTREN LIPID MICROSPHERE
1.0000 mL | INTRAVENOUS | Status: AC | PRN
  Administered 2020-03-07: 5 mL via INTRAVENOUS
  Filled 2020-03-07: qty 10

## 2020-03-07 MED ORDER — HYDRALAZINE HCL 20 MG/ML IJ SOLN
20.0000 mg | Freq: Four times a day (QID) | INTRAMUSCULAR | Status: DC | PRN
  Administered 2020-03-07: 20 mg via INTRAVENOUS
  Filled 2020-03-07: qty 1

## 2020-03-07 NOTE — Consult Note (Addendum)
ELECTROPHYSIOLOGY CONSULT NOTE    Patient ID: Blake [name] MRN: [id number], DOB/AGE: 25-Jan-1941 79 y.o.  Admit date: 03/06/2020 Date of Consult: 03/07/2020  Primary Physician: Patient, No Pcp Per Electrophysiologist: Tiernan Millikin - new this admission  Patient Profile: Blake [name] is a 79 y.o. male with a history of COPD, GERD, HTN, prior CVA who is being seen today for the evaluation of bradycardia at the request of Dr Leonie Man.  HPI:  Blake [name] is a 79 y.o. male with the above past medical history.  He was admitted yesterday with acute onset left sided weakness, left hemianopia and right gaze preference. Imaging demonstrated ischemic infarct in right MCA distribution.  He was given tPA and remains in neuro ICU. Telemetry has demonstrated bradycardia and EP has been asked to evaluate for treatment options.  Echo 01/10/19 demonstrated EF 55-60%, no RWMA.   He is very somnolent today and is not able to answer questions related to ROS.    Past Medical History:  Diagnosis Date  . Chronic insomnia   . Cigarette smoker   . COPD (chronic obstructive pulmonary disease) ([place])   . Diverticulosis of colon   . Elevated prostate specific antigen (PSA)   . ETOH abuse   . GERD (gastroesophageal reflux disease)   . Hx of colonic polyps   . Hyperlipidemia   . Hypertension   . Pulmonary nodule   . Shortness of breath   . Stroke [facility])      Surgical History:  Past Surgical History:  Procedure Laterality Date  . COLONOSCOPY    . ESOPHAGOGASTRODUODENOSCOPY (EGD) WITH PROPOFOL N/A 07/21/2018   Procedure: ESOPHAGOGASTRODUODENOSCOPY (EGD) WITH PROPOFOL;  Surgeon: Lavena Bullion, DO;  Location: [place];  Service: Gastroenterology;  Laterality: N/A;  . needle biopsy RUL nodule  01/2009   benign  . TONSILLECTOMY  1947  . VASECTOMY       Medications Prior to Admission  Medication Sig Dispense Refill Last Dose  . aspirin 325 MG tablet Take 1 tablet (325 mg total) by mouth daily with  breakfast. 30 tablet 2   . atorvastatin (LIPITOR) 20 MG tablet Take 1 tablet (20 mg total) by mouth daily. 30 tablet 11   . Cholecalciferol (VITAMIN D-3) 25 MCG (1000 UT) CAPS Take 1 capsule (1,000 Units total) by mouth daily. 30 capsule 0   . folic acid (FOLVITE) 1 MG tablet Take 1 tablet (1 mg total) by mouth daily. 30 tablet 2   . furosemide (LASIX) 20 MG tablet Take 20 mg by mouth daily.     Marland Kitchen gemfibrozil (LOPID) 600 MG tablet Take 1 tablet (600 mg total) by mouth 2 (two) times daily. 60 tablet 0   . ipratropium-albuterol (DUONEB) 0.5-2.5 (3) MG/3ML SOLN Take 3 mLs by nebulization See admin instructions. Nebulize and inhale 3 ml's into the lungs at 6 AM, 2 PM, and 10 PM for 5 days with a start date of 07/06/18 AND 3 ml's every six hours as needed for coughing 3 mL 0   . levETIRAcetam (KEPPRA) 500 MG tablet Take 1 tablet (500 mg total) by mouth 2 (two) times daily. 60 tablet 0   . Multiple Vitamin (MULTIVITAMIN WITH MINERALS) TABS tablet Take 1 tablet by mouth daily. 30 tablet 2   . potassium chloride (KLOR-CON) 10 MEQ tablet Take 10 mEq by mouth daily.     . QUEtiapine (SEROQUEL) 25 MG tablet Take 1 tablet (25 mg total) by mouth at bedtime. 30 tablet 1   . thiamine  100 MG tablet Take 1 tablet (100 mg total) by mouth daily. 30 tablet 0     Inpatient Medications:  .  stroke: mapping our early stages of recovery book   Does not apply Once  . atorvastatin  20 mg Oral Daily  . Chlorhexidine Gluconate Cloth  6 each Topical Daily  . mouth rinse  15 mL Mouth Rinse BID  . pantoprazole (PROTONIX) IV  40 mg Intravenous QHS  . thiamine injection  100 mg Intravenous Daily    Allergies: No Known Allergies  Social History   Socioeconomic History  . Marital status: Widowed    Spouse name: Mary(deceased 01-01-1999)  . Number of children: 1  . Years of education: Not on file  . Highest education level: Not on file  Occupational History  . 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 Smoking status: Current Every Day Smoker    Packs/day: 1.00    Years: 55.00    Pack years: 55.00    Types: Cigarettes  . Smokeless tobacco: Never Used  . Tobacco comment: not smoking @ SNF  Substance and Sexual Activity  . Alcohol use: Yes    Alcohol/week: 24.0 standard drinks    Types: 24 Cans of beer per week    Comment: 3-4 daily  . Drug use: No  . Sexual activity: Never  Other Topics Concern  . Not on file  Social History Narrative   1 son that died in a MVA   Social Determinants of Health   Financial Resource Strain:   . Difficulty of Paying Living Expenses:   Food Insecurity:   . Worried About Charity fundraiser in the Last Year:   . Arboriculturist in the Last Year:   Transportation Needs:   . Film/video editor (Medical):   Marland Kitchen Lack of Transportation (Non-Medical):   Physical Activity:   . Days of Exercise per Week:   . Minutes of Exercise per Session:   Stress:   . Feeling of Stress :   Social Connections:   . Frequency of Communication with Friends and Family:   . Frequency of Social Gatherings with Friends and Family:   . Attends Religious Services:   . Active Member of Clubs or Organizations:   . Attends Archivist Meetings:   Marland Kitchen Marital Status:   Intimate Partner Violence:   . Fear of Current or Ex-Partner:   . Emotionally Abused:   Marland Kitchen Physically Abused:   . Sexually Abused:      Family History  Problem Relation Age of Onset  . Diabetes Father   . Lung cancer Brother   . Colon cancer Neg Hx   . Esophageal cancer Neg Hx   . Stomach cancer Neg Hx      Review of Systems: All other systems reviewed and are otherwise negative except as noted above.  Physical Exam: Vitals:   03/07/20 0800 03/07/20 0900 03/07/20 0914 03/07/20 1000  BP: (!) 154/65 (!) 138/94  126/67  Pulse: (!) 55 76  (!) 52  Resp: 16 20  17   Temp: (!) 97.3 F (36.3 C)     TempSrc: Axillary     SpO2: 97% 95%  95%  Weight:   64.3 kg   Height:   5'  10" (1.778 m)     GEN- The patient is elderly and chronically ill appearing, briefly responds to questions with grunting.  HEENT: normocephalic, atraumatic; sclera clear,  conjunctiva pink; hearing intact; oropharynx clear; neck supple Lungs-  normal work of breathing  Heart- Regular rate and rhythm  GI- soft, non-tender, non-distended, bowel sounds present Extremities- no clubbing, cyanosis, or edema  MS- no significant deformity or atrophy Skin- warm and dry, no rash or lesion Psych- sleeping but arouses  Labs:   Lab Results  Component Value Date   WBC 10.4 03/07/2020   HGB 13.1 03/07/2020   HCT 39.9 03/07/2020   MCV 93.4 03/07/2020   PLT 239 03/07/2020    Recent Labs  Lab 03/06/20 1648 03/06/20 1648 03/06/20 1655  NA 136   < > 136  K 4.9   < > 4.7  CL 106   < > 105  CO2 20*  --   --   BUN 14   < > 15  CREATININE 1.75*   < > 1.70*  CALCIUM 9.1  --   --   PROT 6.8  --   --   BILITOT 0.9  --   --   ALKPHOS 79  --   --   ALT 9  --   --   AST 20  --   --   GLUCOSE 122*   < > 122*   < > = values in this interval not displayed.      Radiology/Studies: CT Code Stroke CTA Head W/WO contrast  Result Date: 03/06/2020 CLINICAL DATA:  79 year old male code stroke presentation. EXAM: CT ANGIOGRAPHY HEAD AND NECK TECHNIQUE: Multidetector CT imaging of the head and neck was performed using the standard protocol during bolus administration of intravenous contrast. Multiplanar CT image reconstructions and MIPs were obtained to evaluate the vascular anatomy. Carotid stenosis measurements (when applicable) are obtained utilizing NASCET criteria, using the distal internal carotid diameter as the denominator. CONTRAST:  131mL OMNIPAQUE IOHEXOL 350 MG/ML SOLN COMPARISON:  Plain head CT 1658 hours today. CTA head and neck 01/10/2019. FINDINGS: CTA NECK Skeleton: Absent dentition. Cervical spine degeneration. No acute osseous abnormality identified. Chronic posterior neck benign lipoma  (series 14, image 88). Upper chest: Centrilobular emphysema with stable mild apical scarring. Retained secretions in the trachea (series 5, image 159). Mild gas and bubbly opacity also in the proximal esophagus today. No superior mediastinal lymphadenopathy. Other neck: No acute findings. Aortic arch: Scattered calcified arch atherosclerosis. Three vessel arch configuration. Right carotid system: Minimal brachiocephalic and right CCA soft plaque without stenosis. At the bifurcation there is bulky calcified plaque which continues to the right ICA bulb although there is less than 50 % stenosis with respect to the distal vessel. Left carotid system: No left CCA stenosis despite mild plaque. Calcified plaque at the left ICA origin and bulb with less than 50 % stenosis with respect to the distal vessel is stable from last year. Vertebral arteries: Calcified plaque near the right vertebral artery origin but no stenosis. The right vertebral is then normal to the skull base. Mild proximal left subclavian plaque without stenosis. Calcified plaque at the left vertebral artery origin with mild stenosis on series 13, image 153. Mildly non dominant left vertebral otherwise is normal to the skull base. CTA HEAD Posterior circulation: Normal vertebral artery V4 segments and vertebrobasilar junction. The right V4 is mildly dominant. Normal right PICA and dominant left AICA origin. Motion artifact at the level of the basilar which remains patent, no evidence of stenosis. SCA and left PCA origins are normal. Fetal type right PCA. Left posterior communicating artery diminutive or absent. Chronic severe bilateral PCA P2segment stenoses,  worse on the left where the left P2 was thought to be occluded last year, unclear whether there is some distal reconstitution. Overall the PCAs appear stable since 2020. Anterior circulation: Motion artifact through both ICA siphons which remain patent. Grossly stable siphon calcified plaque compared to  last year. Normal right posterior communicating artery origin. Patent right carotid termini. Normal MCA and ACA origins. The left A1 appears mildly non dominant as before. Anterior communicating artery and bilateral ACA branches are within normal limits. Left MCA bifurcates early without stenosis. Left MCA branches are stable and within normal limits. Right MCA M1 and bifurcation are patent without stenosis. Right MCA branches are stable and within normal limits. Venous sinuses: Patent. Anatomic variants: Dominant right vertebral artery. Mildly dominant right ACA A1. Review of the MIP images confirms the above findings IMPRESSION: 1. Negative for emerge large vessel occlusion. Motion artifact at the skull base today but essentially stable CTA head and neck from April 2020. 2. Chronic severe bilateral PCA P2 stenoses, with chronic occlusion or near-occlusion on the left. 3. No significant carotid or vertebral artery stenosis despite calcified plaque. 4. Emphysema (ICD10-J43.9). These results were communicated to Dr. Leonel Ramsay at 5:54 pm on 03/06/2020 by text page via the [facility] messaging system. Electronically Signed   By: Genevie Ann M.D.   On: 03/06/2020 17:54   CT Code Stroke CTA Neck W/WO contrast  Result Date: 03/06/2020 CLINICAL DATA:  79 year old male code stroke presentation. EXAM: CT ANGIOGRAPHY HEAD AND NECK TECHNIQUE: Multidetector CT imaging of the head and neck was performed using the standard protocol during bolus administration of intravenous contrast. Multiplanar CT image reconstructions and MIPs were obtained to evaluate the vascular anatomy. Carotid stenosis measurements (when applicable) are obtained utilizing NASCET criteria, using the distal internal carotid diameter as the denominator. CONTRAST:  182mL OMNIPAQUE IOHEXOL 350 MG/ML SOLN COMPARISON:  Plain head CT 1658 hours today. CTA head and neck 01/10/2019. FINDINGS: CTA NECK Skeleton: Absent dentition. Cervical spine degeneration. No acute  osseous abnormality identified. Chronic posterior neck benign lipoma (series 14, image 88). Upper chest: Centrilobular emphysema with stable mild apical scarring. Retained secretions in the trachea (series 5, image 159). Mild gas and bubbly opacity also in the proximal esophagus today. No superior mediastinal lymphadenopathy. Other neck: No acute findings. Aortic arch: Scattered calcified arch atherosclerosis. Three vessel arch configuration. Right carotid system: Minimal brachiocephalic and right CCA soft plaque without stenosis. At the bifurcation there is bulky calcified plaque which continues to the right ICA bulb although there is less than 50 % stenosis with respect to the distal vessel. Left carotid system: No left CCA stenosis despite mild plaque. Calcified plaque at the left ICA origin and bulb with less than 50 % stenosis with respect to the distal vessel is stable from last year. Vertebral arteries: Calcified plaque near the right vertebral artery origin but no stenosis. The right vertebral is then normal to the skull base. Mild proximal left subclavian plaque without stenosis. Calcified plaque at the left vertebral artery origin with mild stenosis on series 13, image 153. Mildly non dominant left vertebral otherwise is normal to the skull base. CTA HEAD Posterior circulation: Normal vertebral artery V4 segments and vertebrobasilar junction. The right V4 is mildly dominant. Normal right PICA and dominant left AICA origin. Motion artifact at the level of the basilar which remains patent, no evidence of stenosis. SCA and left PCA origins are normal. Fetal type right PCA. Left posterior communicating artery diminutive or absent. Chronic severe bilateral PCA  P2segment stenoses, worse on the left where the left P2 was thought to be occluded last year, unclear whether there is some distal reconstitution. Overall the PCAs appear stable since 2020. Anterior circulation: Motion artifact through both ICA siphons  which remain patent. Grossly stable siphon calcified plaque compared to last year. Normal right posterior communicating artery origin. Patent right carotid termini. Normal MCA and ACA origins. The left A1 appears mildly non dominant as before. Anterior communicating artery and bilateral ACA branches are within normal limits. Left MCA bifurcates early without stenosis. Left MCA branches are stable and within normal limits. Right MCA M1 and bifurcation are patent without stenosis. Right MCA branches are stable and within normal limits. Venous sinuses: Patent. Anatomic variants: Dominant right vertebral artery. Mildly dominant right ACA A1. Review of the MIP images confirms the above findings IMPRESSION: 1. Negative for emerge large vessel occlusion. Motion artifact at the skull base today but essentially stable CTA head and neck from April 2020. 2. Chronic severe bilateral PCA P2 stenoses, with chronic occlusion or near-occlusion on the left. 3. No significant carotid or vertebral artery stenosis despite calcified plaque. 4. Emphysema (ICD10-J43.9). These results were communicated to Dr. Leonel Ramsay at 5:54 pm on 03/06/2020 by text page via the [facility] messaging system. Electronically Signed   By: Genevie Ann M.D.   On: 03/06/2020 17:54   DG CHEST PORT 1 VIEW  Result Date: 03/06/2020 CLINICAL DATA:  Leukocytosis EXAM: PORTABLE CHEST 1 VIEW COMPARISON:  07/17/2018 FINDINGS: A 2 stable cardiac silhouette. Frontal views of the chest demonstrate the chronic areas of scarring are seen throughout the lungs without airspace disease, effusion, or pneumothorax. Numerous prior healed bilateral rib fractures. No acute bony abnormalities. IMPRESSION: 1. No acute intrathoracic process. Electronically Signed   By: Randa Ngo M.D.   On: 03/06/2020 21:08   EEG adult  Result Date: 03/06/2020 Lora Havens, MD     03/06/2020  8:50 PM Patient Name: Blake [name] MRN: [id number] l Epilepsy Attending: Lora Havens Referring  Physician/Provider: Dr Roland Rack Date: 03/06/2020 Duration: 27.55 mins Patient history: 79 y.o. male with a history of atrial fibrillation, not currently taking any medications who presents with left-sided weakness that started abruptly, left hemianopia and right gaze preference . EEG to evaluate for seizure. Level of alertness:  lethargic AEDs during EEG study: Keppra, ativan Technical aspects: This EEG study was done with scalp electrodes positioned according to the 10-20 International system of electrode placement. Electrical activity was acquired at a sampling rate of 500Hz  and reviewed with a high frequency filter of 70Hz  and a low frequency filter of 1Hz . EEG data were recorded continuously and digitally stored. Description: No clear posterior dominant rhythm was seen. EEG showed continuous 3-5hz  theta- delta slowing , at times rhythmic in right hemisphere as well as polymorphic mixed frequencies predominantly with 5-8Hz  theta-alpha activity as well as intermittent 2-3hz  delta activity in left hemisphere. Sharp transients were also noted in right temporal region. Hyperventilation and photic stimulation were not performed.   Of note, study was technically difficult due to significant movement artifact. ABNORMALITY -Continuous slow, generalized and lateralized right hemisphere IMPRESSION: This technically difficult study is suggestive of cortical dysfunction in right hemisphere likely secondary to underlying structural abnormality, post-ictal state. Additionally, there is evidence of moderate diffuse encephalopathy, nonspecific etiology.  No seizures or definite epileptiform discharges were seen throughout the recording. If suspicion for interictal activity remains a concern, a prolonged study including sleep can be considered. Priyanka Barbra Sarks  CT HEAD CODE STROKE WO CONTRAST  Result Date: 03/06/2020 CLINICAL DATA:  Code stroke.  79 year old male EXAM: CT HEAD WITHOUT CONTRAST TECHNIQUE:  Contiguous axial images were obtained from the base of the skull through the vertex without intravenous contrast. COMPARISON:  Brain MRI 01/10/2019. Head CT 06/25/2018. CTA head and neck 01/10/2019. FINDINGS: Brain: Patchy and confluent bilateral cerebral white matter hypodensity appears progressed since 2020. Cerebral volume is not significantly changed. No midline shift, mass effect, or evidence of intracranial mass lesion. Mild ventriculomegaly, probably ex vacuo. No acute intracranial hemorrhage identified. No cortically based acute infarct identified. No cortical encephalomalacia identified. Vascular: Calcified atherosclerosis at the skull base. No suspicious intracranial vascular hyperdensity. Skull: Osteopenia.  No acute osseous abnormality identified. Sinuses/Orbits: Mild new right sphenoid sinus mucosal thickening, otherwise stable and well pneumatized. Other: No acute orbit or scalp soft tissue findings. ASPECTS [facility] Stroke Program Early CT Score) Total score (0-10 with 10 being normal): 10 IMPRESSION: 1. No acute cortically based infarct or acute intracranial hemorrhage identified. ASPECTS 10. 2. Progressed bilateral cerebral white matter disease since 2020. 3. These results were communicated to Dr. Leonel Ramsay at 5:10 pm on 6/10/2021by text page via the [facility] messaging system. Electronically Signed   By: Genevie Ann M.D.   On: 03/06/2020 17:10    OTR:RNHAFB I heart block, narrow QRS, V rate 38 (personally reviewed)  TELEMETRY: SR with Mobitz I, V rates 40-50's (personally reviewed)   Assessment/Plan: 1.  Mobitz I heart block and sinus bradycardia The patient has sinus bradycardia and Mobitz I in the setting of acute stroke and treatment with tPA Avoid all AVN blocking agents When he awakens briefly, his HR goes up into the 50's which is reassuring He is maintaining BP Unable to elicit symptoms with somnolence at this time Would follow clinically at this time EP will follow along   2.   CVA  Per neuro   Dr Rayann Heman to see later today   For questions or updates, please contact [facility] Please consult [email or web address] for contact info under Cardiology/STEMI.  Signed, Chanetta Marshall, NP 03/07/2020 10:51 AM   \I have seen, examined the patient, and reviewed the above assessment and plan.  Changes to above are made where necessary.  On exam, ill appearing, somnolent. The patient has mobitz I second degree AV block.  No acute indication for pacing. Avoid AV nodal agents. I would anticipate that this may improve as the patient recovers clinically.  EP to follow.  Co Sign: Thompson Grayer, MD 03/07/2020 11:06 PM

## 2020-03-07 NOTE — Progress Notes (Signed)
STROKE TEAM PROGRESS NOTE   INTERVAL HISTORY I have personally reviewed history of presenting illness with the patient and his friend as well power of attorney, reviewed electronic medical records and imaging films in PACS.  He presented with sudden onset of right gaze deviation and left-sided weakness and was given IV TPA.  He is remained stable overnight in the ICU with blood pressure tightly controlled.  He became a little agitated last night and was given Ativan.  This morning during rounds he was quite sedated but could be aroused to follow commands. He has been having significant bradycardia when he is asleep with heart rate in the 30-40 range.  Cardiology has been consulted Vitals:   03/07/20 0700 03/07/20 0800 03/07/20 0900 03/07/20 0914  BP: 140/67 (!) 154/65 (!) 138/94   Pulse: 63 (!) 55 76   Resp: 16 16 20    Temp:  (!) 97.3 F (36.3 C)    TempSrc:  Axillary    SpO2: 97% 97% 95%   Weight:    64.3 kg  Height:    5\' 10"  (1.778 m)    CBC:  Recent Labs  Lab 03/06/20 1648 03/06/20 1648 03/06/20 1655 03/07/20 0609  WBC 15.1*  --   --  10.4  NEUTROABS 13.1*  --   --   --   HGB 13.5   < > 13.6 13.1  HCT 42.0   < > 40.0 39.9  MCV 95.2  --   --  93.4  PLT 233  --   --  239   < > = values in this interval not displayed.    Basic Metabolic Panel:  Recent Labs  Lab 03/06/20 1648 03/06/20 1655  NA 136 136  K 4.9 4.7  CL 106 105  CO2 20*  --   GLUCOSE 122* 122*  BUN 14 15  CREATININE 1.75* 1.70*  CALCIUM 9.1  --    Lipid Panel:     Component Value Date/Time   CHOL 147 03/07/2020 0609   TRIG 78 03/07/2020 0609   HDL 47 03/07/2020 0609   CHOLHDL 3.1 03/07/2020 0609   VLDL 16 03/07/2020 0609   LDLCALC 84 03/07/2020 0609   HgbA1c:  Lab Results  Component Value Date   HGBA1C 5.3 03/07/2020   Urine Drug Screen:     Component Value Date/Time   LABOPIA NONE DETECTED 01/10/2019 0024   COCAINSCRNUR NONE DETECTED 01/10/2019 0024   LABBENZ NONE DETECTED 01/10/2019  0024   AMPHETMU NONE DETECTED 01/10/2019 0024   THCU NONE DETECTED 01/10/2019 0024   LABBARB NONE DETECTED 01/10/2019 0024    Alcohol Level     Component Value Date/Time   ETH <10 03/06/2020 1648    IMAGING past 24 hours CT Code Stroke CTA Head W/WO contrast  Result Date: 03/06/2020 CLINICAL DATA:  79 year old male code stroke presentation. EXAM: CT ANGIOGRAPHY HEAD AND NECK TECHNIQUE: Multidetector CT imaging of the head and neck was performed using the standard protocol during bolus administration of intravenous contrast. Multiplanar CT image reconstructions and MIPs were obtained to evaluate the vascular anatomy. Carotid stenosis measurements (when applicable) are obtained utilizing NASCET criteria, using the distal internal carotid diameter as the denominator. CONTRAST:  111mL OMNIPAQUE IOHEXOL 350 MG/ML SOLN COMPARISON:  Plain head CT 1658 hours today. CTA head and neck 01/10/2019. FINDINGS: CTA NECK Skeleton: Absent dentition. Cervical spine degeneration. No acute osseous abnormality identified. Chronic posterior neck benign lipoma (series 14, image 88). Upper chest: Centrilobular emphysema with stable mild apical scarring.  Retained secretions in the trachea (series 5, image 159). Mild gas and bubbly opacity also in the proximal esophagus today. No superior mediastinal lymphadenopathy. Other neck: No acute findings. Aortic arch: Scattered calcified arch atherosclerosis. Three vessel arch configuration. Right carotid system: Minimal brachiocephalic and right CCA soft plaque without stenosis. At the bifurcation there is bulky calcified plaque which continues to the right ICA bulb although there is less than 50 % stenosis with respect to the distal vessel. Left carotid system: No left CCA stenosis despite mild plaque. Calcified plaque at the left ICA origin and bulb with less than 50 % stenosis with respect to the distal vessel is stable from last year. Vertebral arteries: Calcified plaque near the  right vertebral artery origin but no stenosis. The right vertebral is then normal to the skull base. Mild proximal left subclavian plaque without stenosis. Calcified plaque at the left vertebral artery origin with mild stenosis on series 13, image 153. Mildly non dominant left vertebral otherwise is normal to the skull base. CTA HEAD Posterior circulation: Normal vertebral artery V4 segments and vertebrobasilar junction. The right V4 is mildly dominant. Normal right PICA and dominant left AICA origin. Motion artifact at the level of the basilar which remains patent, no evidence of stenosis. SCA and left PCA origins are normal. Fetal type right PCA. Left posterior communicating artery diminutive or absent. Chronic severe bilateral PCA P2segment stenoses, worse on the left where the left P2 was thought to be occluded last year, unclear whether there is some distal reconstitution. Overall the PCAs appear stable since 2020. Anterior circulation: Motion artifact through both ICA siphons which remain patent. Grossly stable siphon calcified plaque compared to last year. Normal right posterior communicating artery origin. Patent right carotid termini. Normal MCA and ACA origins. The left A1 appears mildly non dominant as before. Anterior communicating artery and bilateral ACA branches are within normal limits. Left MCA bifurcates early without stenosis. Left MCA branches are stable and within normal limits. Right MCA M1 and bifurcation are patent without stenosis. Right MCA branches are stable and within normal limits. Venous sinuses: Patent. Anatomic variants: Dominant right vertebral artery. Mildly dominant right ACA A1. Review of the MIP images confirms the above findings IMPRESSION: 1. Negative for emerge large vessel occlusion. Motion artifact at the skull base today but essentially stable CTA head and neck from April 2020. 2. Chronic severe bilateral PCA P2 stenoses, with chronic occlusion or near-occlusion on the  left. 3. No significant carotid or vertebral artery stenosis despite calcified plaque. 4. Emphysema (ICD10-J43.9). These results were communicated to Dr. Leonel Ramsay at 5:54 pm on 03/06/2020 by text page via the [facility] messaging system. Electronically Signed   By: Genevie Ann M.D.   On: 03/06/2020 17:54   CT Code Stroke CTA Neck W/WO contrast  Result Date: 03/06/2020 CLINICAL DATA:  79 year old male code stroke presentation. EXAM: CT ANGIOGRAPHY HEAD AND NECK TECHNIQUE: Multidetector CT imaging of the head and neck was performed using the standard protocol during bolus administration of intravenous contrast. Multiplanar CT image reconstructions and MIPs were obtained to evaluate the vascular anatomy. Carotid stenosis measurements (when applicable) are obtained utilizing NASCET criteria, using the distal internal carotid diameter as the denominator. CONTRAST:  123mL OMNIPAQUE IOHEXOL 350 MG/ML SOLN COMPARISON:  Plain head CT 1658 hours today. CTA head and neck 01/10/2019. FINDINGS: CTA NECK Skeleton: Absent dentition. Cervical spine degeneration. No acute osseous abnormality identified. Chronic posterior neck benign lipoma (series 14, image 88). Upper chest: Centrilobular emphysema with stable mild  apical scarring. Retained secretions in the trachea (series 5, image 159). Mild gas and bubbly opacity also in the proximal esophagus today. No superior mediastinal lymphadenopathy. Other neck: No acute findings. Aortic arch: Scattered calcified arch atherosclerosis. Three vessel arch configuration. Right carotid system: Minimal brachiocephalic and right CCA soft plaque without stenosis. At the bifurcation there is bulky calcified plaque which continues to the right ICA bulb although there is less than 50 % stenosis with respect to the distal vessel. Left carotid system: No left CCA stenosis despite mild plaque. Calcified plaque at the left ICA origin and bulb with less than 50 % stenosis with respect to the distal vessel is  stable from last year. Vertebral arteries: Calcified plaque near the right vertebral artery origin but no stenosis. The right vertebral is then normal to the skull base. Mild proximal left subclavian plaque without stenosis. Calcified plaque at the left vertebral artery origin with mild stenosis on series 13, image 153. Mildly non dominant left vertebral otherwise is normal to the skull base. CTA HEAD Posterior circulation: Normal vertebral artery V4 segments and vertebrobasilar junction. The right V4 is mildly dominant. Normal right PICA and dominant left AICA origin. Motion artifact at the level of the basilar which remains patent, no evidence of stenosis. SCA and left PCA origins are normal. Fetal type right PCA. Left posterior communicating artery diminutive or absent. Chronic severe bilateral PCA P2segment stenoses, worse on the left where the left P2 was thought to be occluded last year, unclear whether there is some distal reconstitution. Overall the PCAs appear stable since 2020. Anterior circulation: Motion artifact through both ICA siphons which remain patent. Grossly stable siphon calcified plaque compared to last year. Normal right posterior communicating artery origin. Patent right carotid termini. Normal MCA and ACA origins. The left A1 appears mildly non dominant as before. Anterior communicating artery and bilateral ACA branches are within normal limits. Left MCA bifurcates early without stenosis. Left MCA branches are stable and within normal limits. Right MCA M1 and bifurcation are patent without stenosis. Right MCA branches are stable and within normal limits. Venous sinuses: Patent. Anatomic variants: Dominant right vertebral artery. Mildly dominant right ACA A1. Review of the MIP images confirms the above findings IMPRESSION: 1. Negative for emerge large vessel occlusion. Motion artifact at the skull base today but essentially stable CTA head and neck from April 2020. 2. Chronic severe bilateral  PCA P2 stenoses, with chronic occlusion or near-occlusion on the left. 3. No significant carotid or vertebral artery stenosis despite calcified plaque. 4. Emphysema (ICD10-J43.9). These results were communicated to Dr. Leonel Ramsay at 5:54 pm on 03/06/2020 by text page via the [facility] messaging system. Electronically Signed   By: Genevie Ann M.D.   On: 03/06/2020 17:54   DG CHEST PORT 1 VIEW  Result Date: 03/06/2020 CLINICAL DATA:  Leukocytosis EXAM: PORTABLE CHEST 1 VIEW COMPARISON:  07/17/2018 FINDINGS: A 2 stable cardiac silhouette. Frontal views of the chest demonstrate the chronic areas of scarring are seen throughout the lungs without airspace disease, effusion, or pneumothorax. Numerous prior healed bilateral rib fractures. No acute bony abnormalities. IMPRESSION: 1. No acute intrathoracic process. Electronically Signed   By: Randa Ngo M.D.   On: 03/06/2020 21:08   EEG adult  Result Date: 03/06/2020 Lora Havens, MD     03/06/2020  8:50 PM Patient Name: CHAUNCE WINKELS MRN: [id number] l Epilepsy Attending: Lora Havens Referring Physician/Provider: Dr Roland Rack Date: 03/06/2020 Duration: 27.55 mins Patient history: 79 y.o. male  with a history of atrial fibrillation, not currently taking any medications who presents with left-sided weakness that started abruptly, left hemianopia and right gaze preference . EEG to evaluate for seizure. Level of alertness:  lethargic AEDs during EEG study: Keppra, ativan Technical aspects: This EEG study was done with scalp electrodes positioned according to the 10-20 International system of electrode placement. Electrical activity was acquired at a sampling rate of 500Hz  and reviewed with a high frequency filter of 70Hz  and a low frequency filter of 1Hz . EEG data were recorded continuously and digitally stored. Description: No clear posterior dominant rhythm was seen. EEG showed continuous 3-5hz  theta- delta slowing , at times rhythmic in right hemisphere as  well as polymorphic mixed frequencies predominantly with 5-8Hz  theta-alpha activity as well as intermittent 2-3hz  delta activity in left hemisphere. Sharp transients were also noted in right temporal region. Hyperventilation and photic stimulation were not performed.   Of note, study was technically difficult due to significant movement artifact. ABNORMALITY -Continuous slow, generalized and lateralized right hemisphere IMPRESSION: This technically difficult study is suggestive of cortical dysfunction in right hemisphere likely secondary to underlying structural abnormality, post-ictal state. Additionally, there is evidence of moderate diffuse encephalopathy, nonspecific etiology.  No seizures or definite epileptiform discharges were seen throughout the recording. If suspicion for interictal activity remains a concern, a prolonged study including sleep can be considered. Lora Havens   CT HEAD CODE STROKE WO CONTRAST  Result Date: 03/06/2020 CLINICAL DATA:  Code stroke.  79 year old male EXAM: CT HEAD WITHOUT CONTRAST TECHNIQUE: Contiguous axial images were obtained from the base of the skull through the vertex without intravenous contrast. COMPARISON:  Brain MRI 01/10/2019. Head CT 06/25/2018. CTA head and neck 01/10/2019. FINDINGS: Brain: Patchy and confluent bilateral cerebral white matter hypodensity appears progressed since 2020. Cerebral volume is not significantly changed. No midline shift, mass effect, or evidence of intracranial mass lesion. Mild ventriculomegaly, probably ex vacuo. No acute intracranial hemorrhage identified. No cortically based acute infarct identified. No cortical encephalomalacia identified. Vascular: Calcified atherosclerosis at the skull base. No suspicious intracranial vascular hyperdensity. Skull: Osteopenia.  No acute osseous abnormality identified. Sinuses/Orbits: Mild new right sphenoid sinus mucosal thickening, otherwise stable and well pneumatized. Other: No acute orbit  or scalp soft tissue findings. ASPECTS [facility] Stroke Program Early CT Score) Total score (0-10 with 10 being normal): 10 IMPRESSION: 1. No acute cortically based infarct or acute intracranial hemorrhage identified. ASPECTS 10. 2. Progressed bilateral cerebral white matter disease since 2020. 3. These results were communicated to Dr. Leonel Ramsay at 5:10 pm on 6/10/2021by text page via the [facility] messaging system. Electronically Signed   By: Genevie Ann M.D.   On: 03/06/2020 17:10    PHYSICAL EXAM Pleasant elderly Caucasian male not in distress.  He is hard of hearing. . Afebrile. Head is nontraumatic. Neck is supple without bruit.    Cardiac exam no murmur or gallop. Lungs are clear to auscultation. Distal pulses are well felt. Neurological Exam : Patient is quite drowsy but can be aroused to follow simple commands and then falls asleep.  He follows simple midline and one-step commands in both extremities.  He has right gaze preference with can be made to look to the left of the midline.  Right pupil is large postsurgical not reactive.  Left pupil is small reactive.  He blinks to threat more on the right than the left.  Face is symmetric.  Tongue midline.  Tends to move right side extremities more than left side  there is mild left-sided weakness tone is increased left more than right.  Sensation appears preserved bilaterally.  Left plantar equivocal right withdrawal response.  Gait not tested ASSESSMENT/PLAN [name] is a 79 y.o. male with history of AF not on AC, currently noncompliant w/ all meds presenting with L sided weakness. Received tPA 03/06/2020 at 1719  Possible Stroke (but doubt) s/p tPA. Workup underway  Code Stroke CT head No acute abnormality. Small vessel disease progressed since 2020. ASPECTS 10.     CTA head & neck no LVO. chornic severe B PCA stenosis w/ L occlusion / near occlusion. ICA/VA plaque w/o stenosis   CT head 6/11 pending   MRI 6/12 pending   2D Echo pending    EEG neg sz, lateralized R brain  LDL 84  HgbA1c 5.3  SCDs for VTE prophylaxis  aspirin 325 mg daily prior to admission, now on No antithrombotic.   Therapy recommendations:  pending   Disposition:  pending (Lived alone PTA)  Lethargy  Given ativan last night  Will waked with stimulation  Hold seroquel  Seizure  Hx sz on Keppra.   EEG neg sz, lateralized R brain - ?post ictal  Atrial Fibrillation  Home anticoagulation:  none     Bradycardia  Cardiology consulted   Likely Wenckebach    HR improves during wakefulness  Will follow over weekend (allred). Doubt any treatment  Hypertension  Home meds:  Lasix 20 & K  Treated with cardene post tPA, now off BP goal per post tPA protocol x 24h following tPA administration . Long-term BP goal normotensive  Hyperlipidemia  Home meds:  lipitor 20, lopid 600 bid  LDL 84, goal < 70  Resume home meds w/ po access  Continue statin at discharge  Dysphagia . NPO . Speech on board . Resume meds w/ po access   Other Stroke Risk Factors  Advanced age  Cigarette smoker, advised to stop smoking  ETOH abuse, alcohol level <10, advised to drink no more than 2 drink(s) a day  Hx stroke/TIA  12/2018  - Incidental R caudate infarct not related to presenting symptoms, secondary to small vessel disease    All previous 2020 MRIs neg for stroke  Other Active Problems  Metabolic encephalopathy w/ confusion in an alcoholic male w/ hx seizures. Likely Baseline dementia from 2020 note. on seroquel  COPD/smoker  Chronic insomnia   GERD  Known pulmonary nodule  Baseline vision loss  Likely CKD - CRE 1.75->1.7  Hospital day # 1 Patient presented with strokelike episode with sudden onset of left-sided weakness and right gaze deviation and was given IV TPA.  Became agitated last night requiring Ativan and seems to be quite sedated with that.  Recommend close neurological monitoring and strict blood pressure  control as per post TPA protocol.  Check MRI scan of the brain later this afternoon if patient is cooperative otherwise change to CT scan.  Continue ongoing stroke work-up.  Check EEG for seizures.  If MRI is negative for stroke may consider trial of Keppra for presumed seizure and postictal state.  Long discussion with the patient and with his friend and health power of attorney at the bedside and answered questions.  Cardiology consulted for bradycardia.  Discussed with Dr. Leonel Ramsay This patient is critically ill and at significant risk of neurological worsening, death and care requires constant monitoring of vital signs, hemodynamics,respiratory and cardiac monitoring, extensive review of multiple databases, frequent neurological assessment, discussion with family, other specialists and  medical decision making of high complexity.I have made any additions or clarifications directly to the above note.This critical care time does not reflect procedure time, or teaching time or supervisory time of PA/NP/Med Resident etc but could involve care discussion time.  I spent 30 minutes of neurocritical care time  in the care of  this patient.    Antony Contras, MD  To contact Stroke Continuity provider, please refer to [email or web address] After hours, contact General Neurology

## 2020-03-07 NOTE — Progress Notes (Signed)
  Speech Language Pathology Treatment: Dysphagia  Patient Details Name: Blake [name] MRN: [id number] DOB: [birth year] Today's Date: 03/07/2020 Time: 8469-6295 SLP Time Calculation (min) (ACUTE ONLY): 17 min  Assessment / Plan / Recommendation Clinical Impression  Pt is much more alert this afternoon, but continues to show immediate coughing intermittently with thin liquids, which increased after he consumed a container of applesauce. There was delayed coughing noted after all PO intake, but not immediately following trials of purees or nectar thick liquids. He does have multiple swallows though. Question if this could be related to his baseline esophageal issues. His POA, Blake [name], says that he has effortful appearing swallows and intermittent coughing at home as well. Given current presentation and ongoing medical work up (MRI planned for later today), recommend starting Dys 1 diet and nectar thick liquids by cup for today, also using aspiration and esophageal precautions. Will f/u on next date for tolerance and potential to advance vs need for instrumental testing.   HPI HPI: Pt is a 79 yo male presenting with acute onset of L weakness, L hemianopia, and R gaze preference. CT Head negative; MRI pending. MBS 07/15/18 primary esophageal dysphagia, backflow from esophagus into pharynx, residue throughout esophagus. Flash penetration of thin. MBS 01/10/19 with mild oral dysphagia and persistent esophageal dysphagia. PMH includes: CVA, HTN, HLD, GERD, ETOH abuse, COPD, smoker, HH, esophageal dilation 2019, tortuous esophagus      SLP Plan  Continue with current plan of care       Recommendations  Diet recommendations: Dysphagia 1 (puree);Nectar-thick liquid Liquids provided via: Cup;No straw Medication Administration: Crushed with puree Supervision: Patient able to self feed;Full supervision/cueing for compensatory strategies Compensations: Slow rate;Small sips/bites;Follow solids with  liquid Postural Changes and/or Swallow Maneuvers: Seated upright 90 degrees;Upright 30-60 min after meal                Oral Care Recommendations: Oral care BID Follow up Recommendations:  (tba) SLP Visit Diagnosis: Dysphagia, unspecified (R13.10) Plan: Continue with current plan of care       GO                 Blake [name]., M.A. [place] [facility] Pager [phone] Office [phone]  03/07/2020, 4:08 PM

## 2020-03-07 NOTE — Progress Notes (Signed)
OT Cancellation Note  Patient Details Name: Blake [name] MRN: [id number] DOB: May 04, 1941   Cancelled Treatment:    Reason Eval/Treat Not Completed: Active bedrest order. Pt currently on bedrest post tpa, will await increased activity orders and proceed with eval as appropriate.  [place], OTR/L Acute Rehab Services Pager [phone] Office [phone]     Almon Register 03/07/2020, 7:43 AM

## 2020-03-07 NOTE — Progress Notes (Signed)
Initial Nutrition Assessment  DOCUMENTATION CODES:   Severe malnutrition in context of chronic illness  INTERVENTION:   Monitor for diet advancement and supplement diet as appropriate.    NUTRITION DIAGNOSIS:   Severe Malnutrition related to chronic illness (COPD, ETOH abuse) as evidenced by severe muscle depletion, severe fat depletion.  GOAL:   Patient will meet greater than or equal to 90% of their needs  MONITOR:   Diet advancement, I & O's  REASON FOR ASSESSMENT:   Malnutrition Screening Tool    ASSESSMENT:   Pt with PMH of Afib, smoker, COPD, ETOH abuse, GERD, HLD, HTN, CVA, and dysphagia, tortuous esophagus s/p esophageal dilation 2019 admitted with acute onset L weakness and R gaze preference CVA suspected. CT negative, MRI pending.   Pt discussed during ICU rounds and with RN.  Per RN pt lives alone.  Per SLP pt too lethargic for swallow eval today. Pt unable to answer any questions, did not wake during exam.   Medications reviewed and include: thiamine  Labs reviewed    NUTRITION - FOCUSED PHYSICAL EXAM:    Most Recent Value  Orbital Region Severe depletion  Upper Arm Region Severe depletion  Thoracic and Lumbar Region Severe depletion  Buccal Region Severe depletion  Temple Region Severe depletion  Clavicle Bone Region Moderate depletion  Clavicle and Acromion Bone Region Severe depletion  Scapular Bone Region Unable to assess  Dorsal Hand Mild depletion  Patellar Region Severe depletion  Anterior Thigh Region Severe depletion  Posterior Calf Region No depletion  Edema (RD Assessment) Mild  Hair Reviewed  Eyes Unable to assess  Mouth Unable to assess  Skin Reviewed  Nails Reviewed       Diet Order:   Diet Order            Diet NPO time specified  Diet effective now                 EDUCATION NEEDS:   No education needs have been identified at this time  Skin:  Skin Assessment: Reviewed RN Assessment  Last BM:   unknown  Height:   Ht Readings from Last 1 Encounters:  03/07/20 5\' 10"  (1.778 m)    Weight:   Wt Readings from Last 1 Encounters:  03/07/20 64.3 kg    Ideal Body Weight:     BMI:  Body mass index is 20.34 kg/m.  Estimated Nutritional Needs:   Kcal:  1800-2000  Protein:  100-115 grams  Fluid:  >1.8 L/day  Lockie Pares., RD, LDN, CNSC See AMiON for contact information

## 2020-03-07 NOTE — Evaluation (Signed)
Speech Language Pathology Evaluation Patient Details Name: Blake [name] MRN: [id number] DOB: 16-Oct-1940 Today's Date: 03/07/2020 Time: 3329-5188 SLP Time Calculation (min) (ACUTE ONLY): 16 min  Problem List:  Patient Active Problem List   Diagnosis Date Noted  . Stroke (cerebrum) ([place]) 03/06/2020  . Acute CVA (cerebrovascular accident) ([place]) 01/10/2019  . Esophageal stricture   . Abnormal finding on GI tract imaging   . Dysphagia 07/20/2018  . Malnutrition of moderate degree 07/10/2018  . Aspiration pneumonia ([place]) 07/07/2018  . Seizure disorder ([place]) 07/07/2018  . HLD (hyperlipidemia) 07/07/2018  . Acute hypoxemic respiratory failure ([place]) 07/07/2018  . Encounter for intubation   . Acute respiratory failure with hypercapnia ([place])   . HCAP (healthcare-associated pneumonia)   . Protein-calorie malnutrition, severe 06/26/2018  . Alcohol withdrawal seizure ([place]) 06/25/2018  . New onset seizure ([place]) 01/07/2018  . Hypertension 01/07/2018  . GERD (gastroesophageal reflux disease) 01/07/2018  . Hyperlipidemia 01/07/2018  . Cigarette smoker 01/07/2018  . Alcohol abuse 01/07/2018  . Acute alcohol abuse, with delirium ([place])   . Acute encephalopathy 10/14/2014  . Unsteady gait 10/14/2014  . Alcohol withdrawal delirium ([place]) 10/14/2014  . Lipoma of neck 12/22/2012  . Cerebral atrophy ([place]) 09/28/2012  . Cerebrovascular small vessel disease 09/28/2012  . Left humeral fracture 07/04/2012  . Delirium 07/04/2012  . ETOH abuse   . ELEVATED PROSTATE SPECIFIC ANTIGEN 09/30/2010  . COPD (chronic obstructive pulmonary disease) ([place]) 01/28/2009  . PULMONARY NODULE 01/28/2009  . INSOMNIA, CHRONIC 10/15/2007  . COLONIC POLYPS 10/10/2007  . Type 2 diabetes mellitus ([place]) 10/10/2007  . Dyslipidemia 10/10/2007  . Essential hypertension 10/10/2007  . GERD 10/10/2007  . DIVERTICULOSIS OF COLON 10/10/2007   Past Medical History:  Past Medical History:  Diagnosis Date  . Chronic insomnia    . Cigarette smoker   . COPD (chronic obstructive pulmonary disease) ([place])   . Diverticulosis of colon   . Elevated prostate specific antigen (PSA)   . ETOH abuse   . GERD (gastroesophageal reflux disease)   . Hx of colonic polyps   . Hyperlipidemia   . Hypertension   . Pulmonary nodule   . Shortness of breath   . Stroke [facility])    Past Surgical History:  Past Surgical History:  Procedure Laterality Date  . COLONOSCOPY    . ESOPHAGOGASTRODUODENOSCOPY (EGD) WITH PROPOFOL N/A 07/21/2018   Procedure: ESOPHAGOGASTRODUODENOSCOPY (EGD) WITH PROPOFOL;  Surgeon: Lavena Bullion, DO;  Location: [place];  Service: Gastroenterology;  Laterality: N/A;  . needle biopsy RUL nodule  01/2009   benign  . TONSILLECTOMY  1947  . VASECTOMY     HPI:  Pt is a 79 yo male presenting with acute onset of L weakness, L hemianopia, and R gaze preference. CT Head negative; MRI pending. MBS 07/15/18 primary esophageal dysphagia, backflow from esophagus into pharynx, residue throughout esophagus. Flash penetration of thin. MBS 01/10/19 with mild oral dysphagia and persistent esophageal dysphagia. PMH includes: CVA, HTN, HLD, GERD, ETOH abuse, COPD, smoker, HH, esophageal dilation 2019, tortuous esophagus   Assessment / Plan / Recommendation Clinical Impression  Pt's POA was present for evaluation and believes that the pt is at his cognitive baseline. PTA he was receiving assistance with household responsibilities, including management of his finances and medical care. He does believe that the pt's speech may be more slurred than baseline, although he is subjectively intelligible >75% of the time. Note that he also did not have his dentures in place at the time of this eval. SLP  will f/u after MRI results to determine if SLP f/u for communication is indicated.     SLP Assessment  SLP Recommendation/Assessment: Patient needs continued Speech Lanaguage Pathology Services SLP Visit Diagnosis: Dysarthria and  anarthria (R47.1)    Follow Up Recommendations  24 hour supervision/assistance    Frequency and Duration min 1 x/week  1 week      SLP Evaluation Cognition  Overall Cognitive Status: History of cognitive impairments - at baseline       Comprehension  Auditory Comprehension Overall Auditory Comprehension: Impaired at baseline    Expression Expression Primary Mode of Expression: Verbal Verbal Expression Overall Verbal Expression: Appears within functional limits for tasks assessed   Oral / Motor  Motor Speech Overall Motor Speech: Impaired Respiration: Within functional limits Phonation: Normal Articulation: Impaired Level of Impairment: Conversation Intelligibility: Intelligibility reduced Conversation: 75-100% accurate   GO                     Osie Bond., M.A. [place] [facility] Pager [phone] Office [phone]  03/07/2020, 4:16 PM

## 2020-03-07 NOTE — Evaluation (Signed)
Clinical/Bedside Swallow Evaluation Patient Details  Name: Blake [name] MRN: [id number] Date of Birth: 01/10/1941  Today's Date: 03/07/2020 Time: SLP Start Time (ACUTE ONLY): 0911 SLP Stop Time (ACUTE ONLY): 0930 SLP Time Calculation (min) (ACUTE ONLY): 19 min  Past Medical History:  Past Medical History:  Diagnosis Date   Chronic insomnia    Cigarette smoker    COPD (chronic obstructive pulmonary disease) ([place])    Diverticulosis of colon    Elevated prostate specific antigen (PSA)    ETOH abuse    GERD (gastroesophageal reflux disease)    Hx of colonic polyps    Hyperlipidemia    Hypertension    Pulmonary nodule    Shortness of breath    Stroke [facility])    Past Surgical History:  Past Surgical History:  Procedure Laterality Date   COLONOSCOPY     ESOPHAGOGASTRODUODENOSCOPY (EGD) WITH PROPOFOL N/A 07/21/2018   Procedure: ESOPHAGOGASTRODUODENOSCOPY (EGD) WITH PROPOFOL;  Surgeon: Lavena Bullion, DO;  [facility];  Service: Gastroenterology;  Laterality: N/A;   needle biopsy RUL nodule  01/2009   benign   TONSILLECTOMY  1947   VASECTOMY     HPI:  Pt is a 79 yo male presenting with acute onset of L weakness, L hemianopia, and R gaze preference. CT Head negative; MRI pending. MBS 07/15/18 primary esophageal dysphagia, backflow from esophagus into pharynx, residue throughout esophagus. Flash penetration of thin. MBS 01/10/19 with mild oral dysphagia and persistent esophageal dysphagia. PMH includes: CVA, HTN, HLD, GERD, ETOH abuse, COPD, smoker, HH, esophageal dilation 2019, tortuous esophagus   Assessment / Plan / Recommendation Clinical Impression  Pt is too lethargic for PO intake this morning, felt to be medication related after receiving ativan overnight. He will wake up briefly and requests POs such as ice cream, but has reduced awareness and falls asleep with POs in his mouth, with associated coughing. Recommend that he remain NPO with potential  to progress to PO diet once more alert. Will f/u as able.    SLP Visit Diagnosis: Dysphagia, unspecified (R13.10)    Aspiration Risk  Moderate aspiration risk    Diet Recommendation NPO   Medication Administration: Via alternative means    Other  Recommendations Oral Care Recommendations: Oral care QID   Follow up Recommendations  (tba)      Frequency and Duration min 2x/week  2 weeks       Prognosis Prognosis for Safe Diet Advancement: Good Barriers to Reach Goals: Time post onset;Cognitive deficits      Swallow Study   General HPI: Pt is a 79 yo male presenting with acute onset of L weakness, L hemianopia, and R gaze preference. CT Head negative; MRI pending. MBS 07/15/18 primary esophageal dysphagia, backflow from esophagus into pharynx, residue throughout esophagus. Flash penetration of thin. MBS 01/10/19 with mild oral dysphagia and persistent esophageal dysphagia. PMH includes: CVA, HTN, HLD, GERD, ETOH abuse, COPD, smoker, HH, esophageal dilation 2019, tortuous esophagus Type of Study: Bedside Swallow Evaluation Previous Swallow Assessment: see HPI Diet Prior to this Study: NPO Temperature Spikes Noted: No Respiratory Status: Nasal cannula History of Recent Intubation: No Behavior/Cognition: Lethargic/Drowsy;Requires cueing Oral Cavity Assessment: Other (comment) (dentures coated, loosely fitting) Oral Care Completed by SLP: Yes Oral Cavity - Dentition: Edentulous;Other (Comment) (upper dentures present but removed to soak/clean) Self-Feeding Abilities: Total assist Patient Positioning: Upright in bed Baseline Vocal Quality: Normal Volitional Swallow: Able to elicit    Oral/Motor/Sensory Function     Ice Chips Ice chips: Impaired Presentation:  Spoon Oral Phase Impairments: Poor awareness of bolus Pharyngeal Phase Impairments: Cough - Immediate   Thin Liquid Thin Liquid: Not tested    Nectar Thick Nectar Thick Liquid: Not tested   Honey Thick Honey Thick  Liquid: Not tested   Puree Puree: Not tested   Solid     Solid: Not tested       Osie Bond., M.A. [place] Pager [phone] Office [phone]  03/07/2020,9:43 AM

## 2020-03-07 NOTE — Progress Notes (Signed)
PT Cancellation Note  Patient Details Name: Blake [name] MRN: [id number] DOB: 02/18/1941   Cancelled Treatment:    Reason Eval/Treat Not Completed: Active bedrest order. Pt received tPA and is on strict bedrest. PT will followup when activity orders are updated.   Zenaida Niece 03/07/2020, 1:16 PM

## 2020-03-07 NOTE — Progress Notes (Signed)
The patient's power of attorney stated the patient has not been taking any medications at all. I left old medications on his PTA med list as a guide for resuming.   Romeo Rabon, PharmD Admin. Coordinator of Transitions of Care 03/07/2020,3:48 PM

## 2020-03-07 NOTE — ED Provider Notes (Signed)
.  Critical Care Performed by: Maudie Flakes, MD Authorized by: Maudie Flakes, MD   Critical care provider statement:    Critical care time (minutes):  31   Critical care was necessary to treat or prevent imminent or life-threatening deterioration of the following conditions:  CNS failure or compromise (concern for acute ischemic stroke)   Critical care was time spent personally by me on the following activities:  Discussions with consultants, evaluation of patient's response to treatment, examination of patient, ordering and performing treatments and interventions, ordering and review of laboratory studies, ordering and review of radiographic studies, pulse oximetry, re-evaluation of patient's condition, obtaining history from patient or surrogate and review of old charts      Maudie Flakes, MD 03/07/20 1605

## 2020-03-07 NOTE — Progress Notes (Signed)
  Echocardiogram 2D Echocardiogram with Definity has been performed.  Blake [name] 03/07/2020, 3:11 PM

## 2020-03-08 DIAGNOSIS — I119 Hypertensive heart disease without heart failure: Secondary | ICD-10-CM

## 2020-03-08 DIAGNOSIS — I4891 Unspecified atrial fibrillation: Secondary | ICD-10-CM

## 2020-03-08 MED ORDER — ASPIRIN EC 325 MG PO TBEC
325.0000 mg | DELAYED_RELEASE_TABLET | Freq: Every day | ORAL | Status: DC
  Administered 2020-03-08: 325 mg via ORAL
  Filled 2020-03-08: qty 1

## 2020-03-08 MED ORDER — THIAMINE HCL 100 MG PO TABS
100.0000 mg | ORAL_TABLET | Freq: Every day | ORAL | Status: DC
  Administered 2020-03-09 – 2020-03-14 (×6): 100 mg via ORAL
  Filled 2020-03-08 (×6): qty 1

## 2020-03-08 MED ORDER — APIXABAN 5 MG PO TABS
5.0000 mg | ORAL_TABLET | Freq: Two times a day (BID) | ORAL | Status: DC
  Administered 2020-03-08 – 2020-03-14 (×13): 5 mg via ORAL
  Filled 2020-03-08 (×13): qty 1

## 2020-03-08 MED ORDER — PANTOPRAZOLE SODIUM 40 MG PO TBEC
40.0000 mg | DELAYED_RELEASE_TABLET | Freq: Every day | ORAL | Status: DC
  Administered 2020-03-08 – 2020-03-13 (×6): 40 mg via ORAL
  Filled 2020-03-08 (×6): qty 1

## 2020-03-08 NOTE — Progress Notes (Signed)
STROKE TEAM PROGRESS NOTE   INTERVAL HISTORY: MRI showed no acute abnormality, doing well, following commands, he continues to have episodes of bradycardia to the 30s while sleeping and cardiology is following. Blood pressure has been < 180, not needing prns. Per cardiology avoiding any AVN blocking agents, no acute indication for pacing but they continue to follow. Starting Eliquis for afib.     Vitals:   03/08/20 0900 03/08/20 1000 03/08/20 1100 03/08/20 1200  BP: (!) 141/86 (!) 145/80 127/74 114/62  Pulse: (!) 57 (!) 54 (!) 53 (!) 110  Resp: 18 20 18 14   Temp:      TempSrc:      SpO2: 100% 100% 100% 100%  Weight:      Height:        CBC:  Recent Labs  Lab 03/06/20 1648 03/06/20 1648 03/06/20 1655 03/07/20 0609  WBC 15.1*  --   --  10.4  NEUTROABS 13.1*  --   --   --   HGB 13.5   < > 13.6 13.1  HCT 42.0   < > 40.0 39.9  MCV 95.2  --   --  93.4  PLT 233  --   --  239   < > = values in this interval not displayed.    Basic Metabolic Panel:  Recent Labs  Lab 03/06/20 1648 03/06/20 1655  NA 136 136  K 4.9 4.7  CL 106 105  CO2 20*  --   GLUCOSE 122* 122*  BUN 14 15  CREATININE 1.75* 1.70*  CALCIUM 9.1  --    Lipid Panel:     Component Value Date/Time   CHOL 147 03/07/2020 0609   TRIG 78 03/07/2020 0609   HDL 47 03/07/2020 0609   CHOLHDL 3.1 03/07/2020 0609   VLDL 16 03/07/2020 0609   LDLCALC 84 03/07/2020 0609   HgbA1c:  Lab Results  Component Value Date   HGBA1C 5.3 03/07/2020   Urine Drug Screen:     Component Value Date/Time   LABOPIA NONE DETECTED 01/10/2019 0024   COCAINSCRNUR NONE DETECTED 01/10/2019 0024   LABBENZ NONE DETECTED 01/10/2019 0024   AMPHETMU NONE DETECTED 01/10/2019 0024   THCU NONE DETECTED 01/10/2019 0024   LABBARB NONE DETECTED 01/10/2019 0024    Alcohol Level     Component Value Date/Time   ETH <10 03/06/2020 1648    IMAGING  MR BRAIN WO CONTRAST 03/07/2020 IMPRESSION:  1. No acute intracranial abnormality.  2.  Stable MRI appearance of the brain since 2020, chronic nonspecific white matter signal changes and mild to moderate lateral and 3rd ventricular enlargement.   CTA Head and Neck 03/06/20 IMPRESSION: 1. Negative for emerge large vessel occlusion. Motion artifact at the skull base today but essentially stable CTA head and neck from April 2020. 2. Chronic severe bilateral PCA P2 stenoses, with chronic occlusion or near-occlusion on the left. 3. No significant carotid or vertebral artery stenosis despite calcified plaque. 4. Emphysema (ICD10-J43.9).  ECHOCARDIOGRAM COMPLETE 03/07/2020 IMPRESSIONS   1. Left ventricular ejection fraction, by estimation, is 55 to 60%. The left ventricle has normal function. Left ventricular endocardial border not optimally defined to evaluate regional wall motion. Left ventricular diastolic parameters are indeterminate.   2. Right ventricular systolic function was not well visualized. The right ventricular size is normal.   3. The mitral valve was not well visualized. No evidence of mitral valve regurgitation.   4. The aortic valve was not well visualized. Aortic valve regurgitation is not visualized.  No aortic stenosis is present. Conclusion(s)/Recommendation(s): No intracardiac source of embolism detected on this transthoracic study. A transesophageal echocardiogram is recommended to exclude cardiac source of embolism if clinically indicated.    PHYSICAL EXAM Pleasant elderly Caucasian male not in distress.  He is hard of hearing. . Afebrile. Head is nontraumatic. Neck is supple without bruit.    Cardiac exam no murmur or gallop. Lungs are clear to auscultation. Distal pulses are well felt. Neurological Exam : Patient is alert oriented to self but not month or year, follows simple commands, follows simple midline and one-step commands in both extremities.  He has right gaze preference with can be made to look to the left of the midline, left hemianopia.  Right pupil  is large postsurgical not reactive.  Left pupil is small reactive.  He blinks to threat on the right but not on the left.  Face is symmetric.  Tongue midline.  Antigravity x 4 with some minimal drift on the right > left.  Sensation appears preserved bilaterally.  Left plantar equivocal right withdrawal response.  Gait not tested   ASSESSMENT/PLAN: Blake [name] is a 79 y.o. male with history of AF not on [facility], currently noncompliant w/ all meds presenting 03/06/20 with L sided weakness. Received tPA 03/06/2020 at 1719.  Stroke likely not seen on MRI (MRI negative for acute findings but patient still continues to have symptoms of right mca stroke). Started on Eliquis for afib.  Code Stroke CT head - No acute abnormality. Small vessel disease progressed since 2020. ASPECTS 10.     CTA head & neck - no LVO. chronic severe B PCA stenosis w/ L occlusion / near occlusion. ICA/VA plaque w/o stenosis   CT head - not ordered  MRI 6/12 - No acute intracranial abnormality.   2D Echo - EF 60 - 65%. No cardiac source of emboli identified.   EEG - neg sz, lateralized R brain  SARS - negative  U/A - pending  LDL 84  HgbA1c 5.3  SCDs for VTE prophylaxis  aspirin 325 mg daily prior to admission, Will start Eliquis for afib  Therapy recommendations:  pending   Disposition:  pending (Lived alone PTA)  Lethargy  Given ativan last night  Will wake with stimulation  Hold seroquel  Seizure  Sz hx -  on Keppra.   EEG neg sz, lateralized R brain - ?post ictal  Now on Keppra 1,000 mg daily  Atrial Fibrillation  Home anticoagulation:  none     Bradycardia  Cardiology consulted - Dr Rayann Heman - The patient has mobitz I second degree AV block.  No acute indication for pacing. Avoid AV nodal agents. (on Cardene prn for BP control)  HR improves during wakefulness  Will follow over weekend.  Hypertension  Home meds:  Lasix 20 & K  Treated with cardene post tPA, now off  BP goal per  post tPA protocol x 24h following tPA administration . Long-term BP goal normotensive  Hyperlipidemia  Home meds:  lipitor 20, lopid 600 bid  LDL 84, goal < 70  Resume home meds w/ po access  Continue statin at discharge  Dysphagia . NPO . Speech on board . Resume meds w/ po access   Other Stroke Risk Factors  Advanced age  Cigarette smoker, advised to stop smoking  ETOH abuse, alcohol level <10, advised to drink no more than 2 drink(s) a day  Hx stroke/TIA  12/2018  - Incidental R caudate infarct not related to presenting  symptoms, secondary to small vessel disease    All previous 2020 MRIs neg for stroke  Medical non compliance  Other Active Problems  Metabolic encephalopathy w/ confusion in an alcoholic male w/ hx seizures. Likely Baseline dementia from 2020 note. On seroquel  COPD/smoker - Emphysema (ICD10-J43.9).  Chronic insomnia   GERD  Known pulmonary nodule  Baseline vision loss  CKD - stage 3b - (GFR - 36) - CRE 1.75->1.7  Chronic severe B PCA stenosis w/ L occlusion / near occlusion. ICA/VA plaque w/o stenosis   Code status - Full code  Hospital day # 2   Personally examined patient and images, and have participated in and made any corrections needed to history, physical, neuro exam,assessment and plan as stated above.  I have personally obtained the history, evaluated lab date, reviewed imaging studies and agree with radiology interpretations.    Sarina Ill, MD Stroke Neurology   A total of 35 minutes was spent for the care of this patient, spent on counseling patient and family on different diagnostic and therapeutic options, counseling and coordination of care, riskd ans benefits of management, compliance, or risk factor reduction and education.     To contact Stroke Continuity provider, please refer to [email or web address] After hours, contact General Neurology

## 2020-03-08 NOTE — Discharge Instructions (Signed)

## 2020-03-08 NOTE — Progress Notes (Addendum)
Progress Note   Subjective   Doing well today, the patient denies CP or SOB.  More alert that yesterday.  No new concerns  Inpatient Medications    Scheduled Meds: .  stroke: mapping our early stages of recovery book   Does not apply Once  . aspirin EC  325 mg Oral Daily  . atorvastatin  20 mg Oral Daily  . Chlorhexidine Gluconate Cloth  6 each Topical Daily  . levETIRAcetam  1,000 mg Oral Daily  . mouth rinse  15 mL Mouth Rinse BID  . pantoprazole (PROTONIX) IV  40 mg Intravenous QHS  . thiamine injection  100 mg Intravenous Daily   Continuous Infusions: . sodium chloride 50 mL/hr (03/08/20 0900)  . niCARDipine Stopped (03/07/20 0018)   PRN Meds: acetaminophen **OR** acetaminophen (TYLENOL) oral liquid 160 mg/5 mL **OR** acetaminophen, hydrALAZINE, LORazepam, [COMPLETED] labetalol **AND** niCARDipine   Vital Signs    Vitals:   03/08/20 0600 03/08/20 0700 03/08/20 0800 03/08/20 0900  BP: (!) 149/81 (!) 149/78 139/61 (!) 141/86  Pulse: 61 (!) 44 (!) 38 (!) 57  Resp: 19 20 15 18   Temp:   97.7 F (36.5 C)   TempSrc:   Axillary   SpO2: 99% 100% 100% 100%  Weight:      Height:        Intake/Output Summary (Last 24 hours) at 03/08/2020 1003 Last data filed at 03/08/2020 0900 Gross per 24 hour  Intake 1190.96 ml  Output 1450 ml  Net -259.04 ml   Filed Weights   03/06/20 1700 03/07/20 0914  Weight: 64.3 kg 64.3 kg    Telemetry    afib with slow ventricular response.  Pauses < 3 seconds,  Upon waking, heart rate quickly goes to 60s - Personally Reviewed  Physical Exam   GEN- The patient is elderly appearing, sleeping but rouses  Head- normocephalic, atraumatic Eyes-  Sclera clear, conjunctiva pink Ears- hearing intact Oropharynx- clear Neck- supple, Lungs-  normal work of breathing Heart- irregular rate and rhythm  GI- soft  Extremities- no clubbing, cyanosis, or edema  MS- diffuse atrophy   Labs    Chemistry Recent Labs  Lab 03/06/20 1648  03/06/20 1655  NA 136 136  K 4.9 4.7  CL 106 105  CO2 20*  --   GLUCOSE 122* 122*  BUN 14 15  CREATININE 1.75* 1.70*  CALCIUM 9.1  --   PROT 6.8  --   ALBUMIN 3.7  --   AST 20  --   ALT 9  --   ALKPHOS 79  --   BILITOT 0.9  --   GFRNONAA 36*  --   GFRAA 42*  --   ANIONGAP 10  --      Hematology Recent Labs  Lab 03/06/20 1648 03/06/20 1655 03/07/20 0609  WBC 15.1*  --  10.4  RBC 4.41  --  4.27  HGB 13.5 13.6 13.1  HCT 42.0 40.0 39.9  MCV 95.2  --  93.4  MCH 30.6  --  30.7  MCHC 32.1  --  32.8  RDW 14.6  --  14.6  PLT 233  --  239   Echo yesterday reveals EF 55%   Patient ID  Blake [name] is a 79 y.o. male with a history of COPD, GERD, HTN, prior CVA who is being seen today for the evaluation of bradycardia at the request of Dr Leonie Man.  Assessment & Plan    1.  Bradycardia His heart rates quickly  increase upon waking.  No indication for pacing or further CV workup at this time.  2. afib New diagnosis today Rates controlled Would not start antiarrhythmics at this time chads2vasc score is at least 5 and includes stroke this admission. I will reach out to Dr Jaynee Eagles to see if she is ok with [place] stopping asa and starting [facility] therapy.  3. Hypertensive cardiovascular disease Stable No change required today  4. CVA As above  Ok to transfer to telemetry Cardiology to see again next week unless problems arise over the weekend.  Thompson Grayer MD, [facility] 03/08/2020 10:03 AM   Addendum: Discussed with Dr Jaynee Eagles.  Ok to switch asa to eliquis from a neuro standpoint.  I have stopped ASA this AM and will start eliquis 5mg  BID  Thompson Grayer MD, [facility] 03/08/2020 10:56 AM

## 2020-03-08 NOTE — Evaluation (Signed)
Physical Therapy Evaluation Patient Details Name: Blake [name] MRN: [id number] DOB: October 28, 1940 Today's Date: 03/08/2020   History of Present Illness  79 yo male presenting with acute onset of L weakness, L hemianopia, and R gaze preference. CT Head negative; MRI negative. Pt received TPA in ED. PMH includes: CVA, HTN, HLD, GERD, ETOH abuse, COPD, smoker.    Clinical Impression  Pt admitted with above diagnosis. PTA pt lived alone, with pt reporting independence with mobility/ADLs. Unsure of validity of pt provided information, although he did report that he falls a lot. On eval, he required min assist bed mobility, mod assist +2 safety transfers, and +2 HH/mod assist ambulation 3'. He presents with balance deficits and generalized weakness. R lateral lean noted in sitting. Cognitive deficits at baseline. Impulsive with poor safety awareness. Pt currently with functional limitations due to the deficits listed below (see PT Problem List). Pt will benefit from skilled PT to increase their independence and safety with mobility to allow discharge to the venue listed below.       Follow Up Recommendations SNF;Supervision/Assistance - 24 hour    Equipment Recommendations  Other (comment) (TBD)    Recommendations for Other Services       Precautions / Restrictions Precautions Precautions: Fall      Mobility  Bed Mobility Overal bed mobility: Needs Assistance Bed Mobility: Supine to Sit     Supine to sit: HOB elevated;Min assist     General bed mobility comments: +rail, cues for sequencing/safety  Transfers Overall transfer level: Needs assistance Equipment used: 1 person hand held assist Transfers: Sit to/from [employer] Transfers Sit to Stand: Mod assist;+2 safety/equipment Stand pivot transfers: Mod assist;+2 safety/equipment       General transfer comment: assist to power up and stabilize balance  Ambulation/Gait Ambulation/Gait assistance: +2 physical  assistance;Mod assist Gait Distance (Feet): 3 Feet Assistive device: 2 person hand held assist Gait Pattern/deviations: Shuffle;Decreased stride length;Trunk flexed     General Gait Details: Drach, shuffle steps bed to recliner. Flexed posture  Stairs            Wheelchair Mobility    Modified Rankin (Stroke Patients Only)       Balance Overall balance assessment: Needs assistance Sitting-balance support: Feet supported;No upper extremity supported Sitting balance-Leahy Scale: Fair   Postural control: Right lateral lean Standing balance support: Bilateral upper extremity supported;During functional activity Standing balance-Leahy Scale: Poor Standing balance comment: reliant on external support                             Pertinent Vitals/Pain Pain Assessment: No/denies pain    Home Living Family/patient expects to be discharged to:: Private residence Living Arrangements: Alone Available Help at Discharge: Family;Friend(s);Available PRN/intermittently Type of Home: Mobile home Home Access: Level entry     Home Layout: One level Home Equipment: None Additional Comments: Unsure of accuracy of information as pt is a poor historian. No family present. Per chart pt living alone, only requiring assist with medicine management and finances.    Prior Function Level of Independence: Independent         Comments: Unsure of accuracy of information as pt is a poor historian. No family present.     Hand Dominance   Dominant Hand: Right    Extremity/Trunk Assessment   Upper Extremity Assessment Upper Extremity Assessment: Defer to OT evaluation    Lower Extremity Assessment Lower Extremity Assessment: Generalized weakness    Cervical /  Trunk Assessment Cervical / Trunk Assessment: Kyphotic  Communication   Communication: HOH  Cognition Arousal/Alertness: Awake/alert Behavior During Therapy: WFL for tasks assessed/performed;Impulsive Overall  Cognitive Status: History of cognitive impairments - at baseline                                 General Comments: Pleasantly confused. Poor safety awareness.      General Comments      Exercises     Assessment/Plan    PT Assessment Patient needs continued PT services  PT Problem List Decreased strength;Decreased mobility;Decreased safety awareness;Decreased knowledge of precautions;Decreased activity tolerance;Decreased cognition;Decreased balance;Decreased knowledge of use of DME       PT Treatment Interventions DME instruction;Therapeutic activities;Cognitive remediation;Gait training;Therapeutic exercise;Patient/family education;Balance training;Functional mobility training    PT Goals (Current goals can be found in the Care Plan section)  Acute Rehab PT Goals Patient Stated Goal: not stated PT Goal Formulation: Patient unable to participate in goal setting Time For Goal Achievement: 03/22/20 Potential to Achieve Goals: Fair    Frequency Min 3X/week   Barriers to discharge Decreased caregiver support      Co-evaluation               AM-PAC PT "6 Clicks" Mobility  Outcome Measure Help needed turning from your back to your side while in a flat bed without using bedrails?: None Help needed moving from lying on your back to sitting on the side of a flat bed without using bedrails?: A Little Help needed moving to and from a bed to a chair (including a wheelchair)?: A Lot Help needed standing up from a chair using your arms (e.g., wheelchair or bedside chair)?: A Lot Help needed to walk in hospital room?: A Lot Help needed climbing 3-5 steps with a railing? : A Lot 6 Click Score: 15    End of Session Equipment Utilized During Treatment: Gait belt Activity Tolerance: Patient tolerated treatment well Patient left: in chair;with call bell/phone within reach;with chair alarm set Nurse Communication: Mobility status PT Visit Diagnosis: Unsteadiness on  feet (R26.81);Difficulty in walking, not elsewhere classified (R26.2);Muscle weakness (generalized) (M62.81)    Time: 6301-6010 PT Time Calculation (min) (ACUTE ONLY): 15 min   Charges:   PT Evaluation $PT Eval Moderate Complexity: 1 Mod          Lorrin Goodell, PT  Office # [phone] Pager [phone]   Lorriane Shire 03/08/2020, 4:11 PM

## 2020-03-08 NOTE — Progress Notes (Signed)
  Speech Language Pathology Treatment: Dysphagia  Patient Details Name: Blake [name] MRN: [id number] DOB: [birth year] Today's Date: 03/08/2020 Time: 1301-1310 SLP Time Calculation (min) (ACUTE ONLY): 9 min  Assessment / Plan / Recommendation Clinical Impression  Pt was seen for skilled ST targeting diet tolerance and diagnostic treatment.  He was encountered asleep in bed and he roused to moderate verbal and tactile stimulation, but he remained lethargic throughout this session.  RN reported that pt had decreased PO intake secondary to lethargy today.  Pt was seen with trials of thin liquid via siphoned straw sip and with puree.  He exhibited oral holding with both trials and he eventually triggered a swallow with thin liquid.  He did not attempt lingual manipulation with the puree bolus despite max cues and it was eventually suctioned from his oral cavity secondary to aspiration concerns.  No overt s/sx of aspiration were observed with thin liquid; however, he was only able to tolerate one trial before falling asleep.  Recommend continuation of current diet (Dysphagia 1 & Nectar-thick liquids) at this time.  SLP will continue to f/u to monitor diet tolerance and to determine readiness for clinical diet upgrade vs need for instrumental swallow study.     HPI HPI: Pt is a 79 yo male presenting with acute onset of L weakness, L hemianopia, and R gaze preference. CT Head negative; MRI pending. MBS 07/15/18 primary esophageal dysphagia, backflow from esophagus into pharynx, residue throughout esophagus. Flash penetration of thin. MBS 01/10/19 with mild oral dysphagia and persistent esophageal dysphagia. PMH includes: CVA, HTN, HLD, GERD, ETOH abuse, COPD, smoker, HH, esophageal dilation 2019, tortuous esophagus      SLP Plan  Continue with current plan of care       Recommendations  Diet recommendations: Dysphagia 1 (puree);Nectar-thick liquid Liquids provided via: Cup;No straw Medication  Administration: Crushed with puree Supervision: Patient able to self feed;Full supervision/cueing for compensatory strategies Compensations: Slow rate;Small sips/bites;Follow solids with liquid Postural Changes and/or Swallow Maneuvers: Seated upright 90 degrees;Upright 30-60 min after meal                Oral Care Recommendations: Oral care BID Follow up Recommendations: 24 hour supervision/assistance SLP Visit Diagnosis: Dysarthria and anarthria (R47.1) Plan: Continue with current plan of care         Colin Mulders M.S., [place] [facility] Office: [phone]   [place] 03/08/2020, 1:19 PM

## 2020-03-09 DIAGNOSIS — G459 Transient cerebral ischemic attack, unspecified: Secondary | ICD-10-CM

## 2020-03-09 DIAGNOSIS — R001 Bradycardia, unspecified: Secondary | ICD-10-CM

## 2020-03-09 DIAGNOSIS — D72829 Elevated white blood cell count, unspecified: Secondary | ICD-10-CM

## 2020-03-09 DIAGNOSIS — R569 Unspecified convulsions: Secondary | ICD-10-CM

## 2020-03-09 DIAGNOSIS — I482 Chronic atrial fibrillation, unspecified: Secondary | ICD-10-CM

## 2020-03-09 LAB — BASIC METABOLIC PANEL
Anion gap: 9 (ref 5–15)
BUN: 16 mg/dL (ref 8–23)
CO2: 21 mmol/L — ABNORMAL LOW (ref 22–32)
Calcium: 8.5 mg/dL — ABNORMAL LOW (ref 8.9–10.3)
Chloride: 112 mmol/L — ABNORMAL HIGH (ref 98–111)
Creatinine, Ser: 1.77 mg/dL — ABNORMAL HIGH (ref 0.61–1.24)
GFR calc Af Amer: 42 mL/min — ABNORMAL LOW (ref >60)
GFR calc non Af Amer: 36 mL/min — ABNORMAL LOW (ref >60)
Glucose, Bld: 94 mg/dL (ref 70–99)
Potassium: 3.7 mmol/L (ref 3.5–5.1)
Sodium: 142 mmol/L (ref 135–145)

## 2020-03-09 LAB — CBC
HCT: 37 % — ABNORMAL LOW (ref 39.0–52.0)
Hemoglobin: 12.1 g/dL — ABNORMAL LOW (ref 13.0–17.0)
MCH: 30.8 pg (ref 26.0–34.0)
MCHC: 32.7 g/dL (ref 30.0–36.0)
MCV: 94.1 fL (ref 80.0–100.0)
Platelets: 223 10*3/uL (ref 150–400)
RBC: 3.93 MIL/uL — ABNORMAL LOW (ref 4.22–5.81)
RDW: 14.8 % (ref 11.5–15.5)
WBC: 7.2 10*3/uL (ref 4.0–10.5)
nRBC: 0 % (ref 0.0–0.2)

## 2020-03-09 LAB — URINALYSIS, ROUTINE W REFLEX MICROSCOPIC
Bilirubin Urine: NEGATIVE
Glucose, UA: NEGATIVE mg/dL
Ketones, ur: NEGATIVE mg/dL
Leukocytes,Ua: NEGATIVE
Nitrite: NEGATIVE
Protein, ur: 30 mg/dL — AB
RBC / HPF: >50 RBC/hpf — ABNORMAL HIGH (ref 0–5)
Specific Gravity, Urine: 1.015 (ref 1.005–1.030)
pH: 5 (ref 5.0–8.0)

## 2020-03-09 LAB — RAPID URINE DRUG SCREEN, HOSP PERFORMED
Amphetamines: NOT DETECTED
Barbiturates: NOT DETECTED
Benzodiazepines: POSITIVE — AB
Cocaine: NOT DETECTED
Opiates: NOT DETECTED
Tetrahydrocannabinol: NOT DETECTED

## 2020-03-09 MED ORDER — STARCH (THICKENING) PO POWD
ORAL | Status: DC | PRN
  Filled 2020-03-09: qty 227

## 2020-03-09 MED ORDER — HYDRALAZINE HCL 20 MG/ML IJ SOLN
20.0000 mg | INTRAMUSCULAR | Status: DC | PRN
  Administered 2020-03-09 – 2020-03-10 (×2): 20 mg via INTRAVENOUS
  Filled 2020-03-09 (×2): qty 1

## 2020-03-09 NOTE — Progress Notes (Signed)
STROKE TEAM PROGRESS NOTE   INTERVAL HISTORY:  RN at bedside. Pt sitting in bed for breakfast, asking for his dentures. He is on nectar thick liquid. Still on IVF due to elevated Cre. Today's BMP pending. He denies any weakness or numbness. Moving all extremities and orientated.    Vitals:   03/09/20 0200 03/09/20 0300 03/09/20 0400 03/09/20 0500  BP: (!) 155/72 (!) 159/91 (!) 148/79 (!) 152/90  Pulse: (!) 50 (!) 57 (!) 33 60  Resp: 17 17 20  (!) 21  Temp:   98.4 F (36.9 C)   TempSrc:   Oral   SpO2: 100% 99% 100% 100%  Weight:      Height:        CBC:  Recent Labs  Lab 03/06/20 1648 03/06/20 1648 03/06/20 1655 03/07/20 0609  WBC 15.1*  --   --  10.4  NEUTROABS 13.1*  --   --   --   HGB 13.5   < > 13.6 13.1  HCT 42.0   < > 40.0 39.9  MCV 95.2  --   --  93.4  PLT 233  --   --  239   < > = values in this interval not displayed.    Basic Metabolic Panel:  Recent Labs  Lab 03/06/20 1648 03/06/20 1655  NA 136 136  K 4.9 4.7  CL 106 105  CO2 20*  --   GLUCOSE 122* 122*  BUN 14 15  CREATININE 1.75* 1.70*  CALCIUM 9.1  --    Lipid Panel:     Component Value Date/Time   CHOL 147 03/07/2020 0609   TRIG 78 03/07/2020 0609   HDL 47 03/07/2020 0609   CHOLHDL 3.1 03/07/2020 0609   VLDL 16 03/07/2020 0609   LDLCALC 84 03/07/2020 0609   HgbA1c:  Lab Results  Component Value Date   HGBA1C 5.3 03/07/2020   Urine Drug Screen:     Component Value Date/Time   LABOPIA NONE DETECTED 01/10/2019 0024   COCAINSCRNUR NONE DETECTED 01/10/2019 0024   LABBENZ NONE DETECTED 01/10/2019 0024   AMPHETMU NONE DETECTED 01/10/2019 0024   THCU NONE DETECTED 01/10/2019 0024   LABBARB NONE DETECTED 01/10/2019 0024    Alcohol Level     Component Value Date/Time   ETH <10 03/06/2020 1648    IMAGING  MR BRAIN WO CONTRAST 03/07/2020 IMPRESSION:  1. No acute intracranial abnormality.  2. Stable MRI appearance of the brain since 2020, chronic nonspecific white matter signal  changes and mild to moderate lateral and 3rd ventricular enlargement.   CTA Head and Neck 03/06/20 IMPRESSION: 1. Negative for emerge large vessel occlusion. Motion artifact at the skull base today but essentially stable CTA head and neck from April 2020. 2. Chronic severe bilateral PCA P2 stenoses, with chronic occlusion or near-occlusion on the left. 3. No significant carotid or vertebral artery stenosis despite calcified plaque. 4. Emphysema (ICD10-J43.9).  ECHOCARDIOGRAM COMPLETE 03/07/2020 IMPRESSIONS   1. Left ventricular ejection fraction, by estimation, is 55 to 60%. The left ventricle has normal function. Left ventricular endocardial border not optimally defined to evaluate regional wall motion. Left ventricular diastolic parameters are indeterminate.   2. Right ventricular systolic function was not well visualized. The right ventricular size is normal.   3. The mitral valve was not well visualized. No evidence of mitral valve regurgitation.   4. The aortic valve was not well visualized. Aortic valve regurgitation is not visualized. No aortic stenosis is present. Conclusion(s)/Recommendation(s): No intracardiac source of  embolism detected on this transthoracic study. A transesophageal echocardiogram is recommended to exclude cardiac source of embolism if clinically indicated.    PHYSICAL EXAM    Temp:  [97.3 F (36.3 C)-98.5 F (36.9 C)] 98 F (36.7 C) (06/13 0800) Pulse Rate:  [33-110] 50 (06/13 0900) Resp:  [11-30] 16 (06/13 0900) BP: (114-187)/(62-131) 157/79 (06/13 0900) SpO2:  [97 %-100 %] 98 % (06/13 0900)  General - Well nourished, well developed, in no apparent distress.  Ophthalmologic - fundi not visualized due to noncooperation.  Cardiovascular - irregularly irregular heart rate and rhythm, with frequent bradycardia.  Mental Status -  Level of arousal and orientation to time, place, and person were intact. Language including expression, naming, repetition,  comprehension was assessed and found intact. Mild dysarthria due to poor denture. Mild psychomotor slowing due to hard of hearing  Cranial Nerves II - XII - II - Visual field intact OU. III, IV, VI - Extraocular movements intact. V - Facial sensation intact bilaterally. VII - Facial movement intact bilaterally. VIII - hard of hearing & vestibular intact bilaterally. X - Palate elevates symmetrically. Poor denture, mild dysarthria XI - Chin turning & shoulder shrug intact bilaterally. XII - Tongue protrusion intact.  Motor Strength - The patient's strength was normal in all extremities and pronator drift was absent.  Bulk was normal and fasciculations were absent.   Motor Tone - Muscle tone was assessed at the neck and appendages and was normal.  Reflexes - The patient's reflexes were symmetrical in all extremities and he had no pathological reflexes.  Sensory - Light touch, temperature/pinprick were assessed and were symmetrical.    Coordination - The patient had normal movements in the hands with no ataxia or dysmetria.  Tremor was absent.  Gait and Station - deferred.    ASSESSMENT/PLAN: [name] is a 79 y.o. male with history of AF not on [facility], currently noncompliant w/ all meds presenting 03/06/20 with L sided weakness. Received tPA 03/06/2020 at 1719.  TIA vs. Aborted stroke vs. Seizure  CT head - No acute abnormality. Small vessel disease progressed since 2020. ASPECTS 10.     CTA head & neck - no LVO. chronic severe B PCA stenosis w/ L occlusion / near occlusion. ICA/VA plaque w/o stenosis   MRI 6/12 - No acute intracranial abnormality.   2D Echo - EF 60 - 65%. No cardiac source of emboli identified.   EEG - neg sz, lateralized R brain  SARS - negative  UDS - benzo  LDL 84  HgbA1c 5.3  SCDs for VTE prophylaxis  aspirin 325 mg daily prior to admission, Now on Eliquis for afib stroke prevention. Continue on discharge.  Therapy recommendations:   SNF  Disposition:  pending (Lived alone PTA)  Hx of seizure  Sz hx - on Keppra (not taking)   EEG neg sz, lateralized R brain - ?post ictal  On Keppra XR 1,000 mg daily  Atrial Fibrillation Bradycardia  Cardiology on board   mobitz I second degree AV block.  No acute indication for pacing. Avoid AV nodal agents.   HR down to 33 during the night  HR improves during wakefulness  Not on [facility] PTA  Now on eliquis for stroke prevention  Hx stroke/TIA  12/2018  - Incidental R caudate infarct not related to presenting symptoms, secondary to small vessel disease    All previous 2020 MRIs neg for stroke  Medical non-compliance  Hypertension  Home meds:  Lasix 20  Treated with cardene post tPA, now off  BP stable . Long-term BP goal normotensive  Hyperlipidemia  Home meds:  lipitor 20 (not taking)  LDL 84, goal < 70  Resume lipitor 20  Continue statin at discharge  Dysphagia . Now on dys 1 with nectar thick . Speech on board  Tobacco abuse  Current smoker  Smoking cessation counseling provided  Pt is willing to quit   Other Stroke Risk Factors  Advanced age  ETOH abuse, alcohol level <10, advised to drink no more than 2 drink(s) a day  Other Active Problems  Metabolic encephalopathy w/ confusion in an alcoholic male w/ hx seizures. Likely Baseline dementia from 2020 note. On seroquel  GERD  CKD - stage 3b - (GFR - 36) - CRE 1.75->1.7  Code status - Full code  Hospital day # 3   This patient is critically ill due to stroke like symptoms s/p tPA, ? Seizure, bradycardia needing cardiology consult, PAF not on AC and at significant risk of neurological worsening, death form recurrent stroke, hemorrhagic conversion, status epilepticus, heart failure. This patient's care requires constant monitoring of vital signs, hemodynamics, respiratory and cardiac monitoring, review of multiple databases, neurological assessment, discussion with family, other  specialists and medical decision making of high complexity. I spent 35 minutes of neurocritical care time in the care of this patient.  [name], MD PhD Stroke Neurology 03/09/2020 7:40 PM  To contact Stroke Continuity provider, please refer to [email or web address] After hours, contact General Neurology

## 2020-03-09 NOTE — TOC Initial Note (Signed)
Transition of Care [facility]) - Initial/Assessment Note    Patient Details  Name: Blake [name] MRN: [id number] Date of Birth: 12-Feb-1941  Transition of Care [facility] CM/SW Contact:    Oretha Milch, LCSW Phone Number: 03/09/2020, 10:48 AM  Clinical Narrative: CSW received consult for substance use resources and notes PT recommendation for skilled nursing facility. Patient reported he did not need physical therapy and declined consent to begin the SNF work-up process. CSW notes it was documented elsewhere in the chart he has a HCPOA but notes there is no documentation scanned in. CSW discussed substance use and noted patient reacted in a guarded manner but did accept substance use resources. CSW discussed home health as an alternative and notes patient may be receptive over course of treatment. TOC will continue to follow for discharge supports.                   Expected Discharge Plan: [place] Barriers to Discharge: Continued Medical Work up   Patient Goals and CMS Choice Patient states their goals for this hospitalization and ongoing recovery are:: "I'm going to go home." CMS Medicare.gov Compare Post Acute Care list provided to:: Patient Choice offered to / list presented to : Patient  Expected Discharge Plan and Services Expected Discharge Plan: [place] Choice: [place] arrangements for the past 2 months: Single Family Home                   DME Agency: NA                  Prior Living Arrangements/Services Living arrangements for the past 2 months: Single Family Home Lives with:: Self Patient language and need for interpreter reviewed:: Yes Do you feel safe going back to the place where you live?: Yes      Need for Family Participation in Patient Care: No (Comment) Care giver support system in place?: Yes (comment) Current home services: DME Criminal Activity/Legal Involvement Pertinent to Current  Situation/Hospitalization: No - Comment as needed  Activities of Daily Living Home Assistive Devices/Equipment: None ADL Screening (condition at time of admission) Patient's cognitive ability adequate to safely complete daily activities?: No Is the patient deaf or have difficulty hearing?: No Does the patient have difficulty seeing, even when wearing glasses/contacts?: Yes Does the patient have difficulty concentrating, remembering, or making decisions?: Yes Patient able to express need for assistance with ADLs?: Yes Does the patient have difficulty dressing or bathing?: No Independently performs ADLs?: No Communication: Independent Dressing (OT): Independent Grooming: Independent Feeding: Needs assistance Is this a change from baseline?: Pre-admission baseline Bathing: Needs assistance Is this a change from baseline?: Pre-admission baseline Toileting: Independent In/Out Bed: Needs assistance Is this a change from baseline?: Pre-admission baseline Walks in Home: Independent Does the patient have difficulty walking or climbing stairs?: Yes Weakness of Legs: Both Weakness of Arms/Hands: Left  Permission Sought/Granted Permission sought to share information with : Facility Sport and exercise psychologist, Family Supports Permission granted to share information with : No              Emotional Assessment Appearance:: Appears stated age Attitude/Demeanor/Rapport: Self-Confident Affect (typically observed): Blunt Orientation: : Oriented to Self, Oriented to Situation, Oriented to Place Alcohol / Substance Use: Alcohol Use Psych Involvement: No (comment)  Admission diagnosis:  Stroke (cerebrum) (HCC) [I63.9] Cerebrovascular accident (CVA), unspecified mechanism ([place]) [I63.9] Patient Active Problem List   Diagnosis  Date Noted  . Stroke (cerebrum) ([place]) 03/06/2020  . Acute CVA (cerebrovascular accident) ([place]) 01/10/2019  . Esophageal stricture   . Abnormal finding on GI tract imaging    . Dysphagia 07/20/2018  . Malnutrition of moderate degree 07/10/2018  . Aspiration pneumonia ([place]) 07/07/2018  . Seizure disorder ([place]) 07/07/2018  . HLD (hyperlipidemia) 07/07/2018  . Acute hypoxemic respiratory failure ([place]) 07/07/2018  . Encounter for intubation   . Acute respiratory failure with hypercapnia ([place])   . HCAP (healthcare-associated pneumonia)   . Protein-calorie malnutrition, severe 06/26/2018  . Alcohol withdrawal seizure ([place]) 06/25/2018  . New onset seizure ([place]) 01/07/2018  . Hypertension 01/07/2018  . GERD (gastroesophageal reflux disease) 01/07/2018  . Hyperlipidemia 01/07/2018  . Cigarette smoker 01/07/2018  . Alcohol abuse 01/07/2018  . Acute alcohol abuse, with delirium ([place])   . Acute encephalopathy 10/14/2014  . Unsteady gait 10/14/2014  . Alcohol withdrawal delirium ([place]) 10/14/2014  . Lipoma of neck 12/22/2012  . Cerebral atrophy ([place]) 09/28/2012  . Cerebrovascular small vessel disease 09/28/2012  . Left humeral fracture 07/04/2012  . Delirium 07/04/2012  . ETOH abuse   . ELEVATED PROSTATE SPECIFIC ANTIGEN 09/30/2010  . COPD (chronic obstructive pulmonary disease) ([place]) 01/28/2009  . PULMONARY NODULE 01/28/2009  . INSOMNIA, CHRONIC 10/15/2007  . COLONIC POLYPS 10/10/2007  . Type 2 diabetes mellitus ([place]) 10/10/2007  . Dyslipidemia 10/10/2007  . Essential hypertension 10/10/2007  . GERD 10/10/2007  . DIVERTICULOSIS OF COLON 10/10/2007   PCP:  Patient, No Pcp Per Pharmacy:   [facility], [place] - [address] [place] [address] Phone: [phone] Fax: [phone]     Social Determinants of Health (SDOH) Interventions    Readmission Risk Interventions No flowsheet data found.

## 2020-03-09 NOTE — Progress Notes (Signed)
Progress Note   Subjective   Doing well today, the patient denies CP or SOB.  No new concerns  Inpatient Medications    Scheduled Meds: .  stroke: mapping our early stages of recovery book   Does not apply Once  . apixaban  5 mg Oral BID  . atorvastatin  20 mg Oral Daily  . Chlorhexidine Gluconate Cloth  6 each Topical Daily  . levETIRAcetam  1,000 mg Oral Daily  . mouth rinse  15 mL Mouth Rinse BID  . pantoprazole  40 mg Oral QHS  . thiamine  100 mg Oral Daily   Continuous Infusions: . sodium chloride 50 mL/hr (03/09/20 0800)  . niCARDipine Stopped (03/07/20 0018)   PRN Meds: acetaminophen **OR** acetaminophen (TYLENOL) oral liquid 160 mg/5 mL **OR** acetaminophen, hydrALAZINE, LORazepam, [COMPLETED] labetalol **AND** niCARDipine   Vital Signs    Vitals:   03/09/20 0500 03/09/20 0600 03/09/20 0700 03/09/20 0800  BP: (!) 152/90 (!) 150/69 (!) 150/78 (!) 162/67  Pulse: 60 (!) 48 (!) 50 (!) 41  Resp: (!) 21 16 12 13   Temp:    98 F (36.7 C)  TempSrc:    Axillary  SpO2: 100% 98% 98% 98%  Weight:      Height:        Intake/Output Summary (Last 24 hours) at 03/09/2020 0902 Last data filed at 03/09/2020 0800 Gross per 24 hour  Intake 1149.8 ml  Output 1050 ml  Net 99.8 ml   Filed Weights   03/06/20 1700 03/07/20 0914  Weight: 64.3 kg 64.3 kg    Telemetry    afib with controlled V rates,  V rats mostly 40s-50s, V rates increase when he is awake and talking, Personally Reviewed  Physical Exam   GEN- The patient is elderly appearing, sleeping but rouses Head- normocephalic, atraumatic Eyes-  Sclera clear, conjunctiva pink Ears- hearing intact Oropharynx- clear Neck- supple, Lungs-  normal work of breathing Heart- irregulareld rate and rhythm  GI- soft  Extremities- no clubbing, cyanosis, or edema  MS-diffuse atrophy Skin- no rash or lesion   Labs    Chemistry Recent Labs  Lab 03/06/20 1648 03/06/20 1655  NA 136 136  K 4.9 4.7  CL 106 105    CO2 20*  --   GLUCOSE 122* 122*  BUN 14 15  CREATININE 1.75* 1.70*  CALCIUM 9.1  --   PROT 6.8  --   ALBUMIN 3.7  --   AST 20  --   ALT 9  --   ALKPHOS 79  --   BILITOT 0.9  --   GFRNONAA 36*  --   GFRAA 42*  --   ANIONGAP 10  --      Hematology Recent Labs  Lab 03/06/20 1648 03/06/20 1655 03/07/20 0609  WBC 15.1*  --  10.4  RBC 4.41  --  4.27  HGB 13.5 13.6 13.1  HCT 42.0 40.0 39.9  MCV 95.2  --  93.4  MCH 30.6  --  30.7  MCHC 32.1  --  32.8  RDW 14.6  --  14.6  PLT 233  --  239     Patient ID   Blake [name] a 79 y.o.malewith a history of COPD, GERD, HTN, prior CVAwho is being seen today for the evaluation of bradycardiaat the request of Dr Leonie Man.  Observed to have new onset afib this admission. Assessment & Plan    1.  Rate controlled afib Now on eliquis Does not currently appear  to be symptomatic No changes at this time  2. Bradycardia Previously with mobitz I second degree AV block and now slow afib. His V rates improve when he is aware and go slow again when he is resting.  I do not feel that pacing is necessary at this time.  Hopefully as he recovers from his stroke and becomes more active his V rates with return to baseline. Avoid AV nodal agents  3. HTN Stable No change required today  4. CVA Per neurology  No further inpatient cardiac evaluation required at this time. General cardiology team to be available as needed. Will arrange afib clinic follow-up.  Thompson Grayer MD, [facility] 03/09/2020 9:02 AM

## 2020-03-10 ENCOUNTER — Inpatient Hospital Stay (HOSPITAL_COMMUNITY): Payer: Medicare Other

## 2020-03-10 DIAGNOSIS — E43 Unspecified severe protein-calorie malnutrition: Secondary | ICD-10-CM

## 2020-03-10 DIAGNOSIS — R1312 Dysphagia, oropharyngeal phase: Secondary | ICD-10-CM

## 2020-03-10 DIAGNOSIS — I10 Essential (primary) hypertension: Secondary | ICD-10-CM

## 2020-03-10 DIAGNOSIS — E7800 Pure hypercholesterolemia, unspecified: Secondary | ICD-10-CM

## 2020-03-10 DIAGNOSIS — G40909 Epilepsy, unspecified, not intractable, without status epilepticus: Secondary | ICD-10-CM

## 2020-03-10 LAB — CBC
HCT: 37.6 % — ABNORMAL LOW (ref 39.0–52.0)
Hemoglobin: 11.9 g/dL — ABNORMAL LOW (ref 13.0–17.0)
MCH: 30.4 pg (ref 26.0–34.0)
MCHC: 31.6 g/dL (ref 30.0–36.0)
MCV: 96.2 fL (ref 80.0–100.0)
Platelets: 232 10*3/uL (ref 150–400)
RBC: 3.91 MIL/uL — ABNORMAL LOW (ref 4.22–5.81)
RDW: 15.2 % (ref 11.5–15.5)
WBC: 8.9 10*3/uL (ref 4.0–10.5)
nRBC: 0 % (ref 0.0–0.2)

## 2020-03-10 LAB — BASIC METABOLIC PANEL
Anion gap: 6 (ref 5–15)
BUN: 18 mg/dL (ref 8–23)
CO2: 21 mmol/L — ABNORMAL LOW (ref 22–32)
Calcium: 8.2 mg/dL — ABNORMAL LOW (ref 8.9–10.3)
Chloride: 110 mmol/L (ref 98–111)
Creatinine, Ser: 1.64 mg/dL — ABNORMAL HIGH (ref 0.61–1.24)
GFR calc Af Amer: 46 mL/min — ABNORMAL LOW (ref >60)
GFR calc non Af Amer: 39 mL/min — ABNORMAL LOW (ref >60)
Glucose, Bld: 95 mg/dL (ref 70–99)
Potassium: 3.6 mmol/L (ref 3.5–5.1)
Sodium: 137 mmol/L (ref 135–145)

## 2020-03-10 MED ORDER — LEVETIRACETAM 100 MG/ML PO SOLN
500.0000 mg | Freq: Two times a day (BID) | ORAL | Status: DC
  Administered 2020-03-10 – 2020-03-14 (×9): 500 mg via ORAL
  Filled 2020-03-10 (×9): qty 5

## 2020-03-10 MED ORDER — ENSURE ENLIVE PO LIQD
237.0000 mL | Freq: Two times a day (BID) | ORAL | Status: DC
  Administered 2020-03-10 – 2020-03-14 (×9): 237 mL via ORAL

## 2020-03-10 MED ORDER — LEVETIRACETAM 500 MG PO TABS
500.0000 mg | ORAL_TABLET | Freq: Two times a day (BID) | ORAL | Status: DC
  Filled 2020-03-10: qty 1

## 2020-03-10 NOTE — Progress Notes (Signed)
STROKE TEAM PROGRESS NOTE   INTERVAL HISTORY:  RN at bedside.  Patient lying in bed, awake alert, orientated.  However, patient has no insight for his deficit and insist that he will go home and not rehab center.  PT/OT recommend CIR versus SNF.  Patient lives alone and lack of supervision or support, will need discussed with SW and CIR for discharge planning.  Vitals:   03/10/20 0800 03/10/20 0900 03/10/20 1000 03/10/20 1200  BP:  (!) 162/74 (!) 177/90 (!) 147/61  Pulse:  (!) 59 61 (!) 54  Resp:  19 (!) 24 (!) 22  Temp: 98.2 F (36.8 C)   98.5 F (36.9 C)  TempSrc: Oral   Oral  SpO2:  98% 98% 100%  Weight:      Height:       CBC:  Recent Labs  Lab 03/06/20 1648 03/06/20 1655 03/09/20 0916 03/10/20 0931  WBC 15.1*   < > 7.2 8.9  NEUTROABS 13.1*  --   --   --   HGB 13.5   < > 12.1* 11.9*  HCT 42.0   < > 37.0* 37.6*  MCV 95.2   < > 94.1 96.2  PLT 233   < > 223 232   < > = values in this interval not displayed.   Basic Metabolic Panel:  Recent Labs  Lab 03/09/20 0916 03/10/20 0931  NA 142 137  K 3.7 3.6  CL 112* 110  CO2 21* 21*  GLUCOSE 94 95  BUN 16 18  CREATININE 1.77* 1.64*  CALCIUM 8.5* 8.2*   Lipid Panel:     Component Value Date/Time   CHOL 147 03/07/2020 0609   TRIG 78 03/07/2020 0609   HDL 47 03/07/2020 0609   CHOLHDL 3.1 03/07/2020 0609   VLDL 16 03/07/2020 0609   LDLCALC 84 03/07/2020 0609   HgbA1c:  Lab Results  Component Value Date   HGBA1C 5.3 03/07/2020   Urine Drug Screen:     Component Value Date/Time   LABOPIA NONE DETECTED 03/09/2020 1437   COCAINSCRNUR NONE DETECTED 03/09/2020 1437   LABBENZ POSITIVE (A) 03/09/2020 1437   AMPHETMU NONE DETECTED 03/09/2020 1437   THCU NONE DETECTED 03/09/2020 1437   LABBARB NONE DETECTED 03/09/2020 1437    Alcohol Level     Component Value Date/Time   ETH <10 03/06/2020 1648    IMAGING DG Swallowing Func-Speech Pathology  Result Date: 03/10/2020 Objective Swallowing Evaluation: Type of  Study: MBS-Modified Barium Swallow Study  Patient Details Name: Blake [name] MRN: [id number] Date of Birth: 04-28-1941 Today's Date: 03/10/2020 Time: SLP Start Time (ACUTE ONLY): 1114 -SLP Stop Time (ACUTE ONLY): 1134 SLP Time Calculation (min) (ACUTE ONLY): 20 min Past Medical History: Past Medical History: Diagnosis Date . Chronic insomnia  . Cigarette smoker  . COPD (chronic obstructive pulmonary disease) ([place])  . Diverticulosis of colon  . Elevated prostate specific antigen (PSA)  . ETOH abuse  . GERD (gastroesophageal reflux disease)  . Hx of colonic polyps  . Hyperlipidemia  . Hypertension  . Pulmonary nodule  . Shortness of breath  . Stroke [facility])  Past Surgical History: Past Surgical History: Procedure Laterality Date . COLONOSCOPY   . ESOPHAGOGASTRODUODENOSCOPY (EGD) WITH PROPOFOL N/A 07/21/2018  Procedure: ESOPHAGOGASTRODUODENOSCOPY (EGD) WITH PROPOFOL;  Surgeon: Lavena Bullion, DO;  Location: [facility];  Service: Gastroenterology;  Laterality: N/A; . needle biopsy RUL nodule  01/2009  benign . TONSILLECTOMY  1947 . VASECTOMY   HPI: Pt is a 79 yo male  presenting with acute onset of L weakness, L hemianopia, and R gaze preference. CT Head negative; MRI pending. MBS 07/15/18 primary esophageal dysphagia, backflow from esophagus into pharynx, residue throughout esophagus. Flash penetration of thin. MBS 01/10/19 with mild oral dysphagia and persistent esophageal dysphagia. PMH includes: CVA, HTN, HLD, GERD, ETOH abuse, COPD, smoker, HH, esophageal dilation 2019, tortuous esophagus  Subjective: alert, cooperative, HOH Assessment / Plan / Recommendation CHL IP CLINICAL IMPRESSIONS 03/10/2020 Clinical Impression Pt's swallow function appears to be grossly similar to previous MBS, with what apepars to be a primary esophageal dysphagia. He has a prolonged oral phase with decreased bolus cohesion, losing some of the boluses sublingually with mild residue remaining, but clearing this well with a spontaneous second  swallow. He had a single instance of premature spillage when drinking thin liquids via straw, which appeared to penetrate to the vocal folds but fully clear (visibility below the vocal folds was reduced by pt positioning in that moment, but no barium was noted in his trachea or laryngeal vestibule after). Otherwise he would have intermittent, trace penetration that would clear as he continued to swallow. Towards the end of testing pt did exhibit coughing as he has been demonstrating upstairs, but this was a result of backflow from the esophagus entering into his pharynx/pyriform sinuses. There was no backflow into the larynx on this study, but his esophagus appeared to be filled with barium. Recommend Dys 2 diet and thin liquids in smaller, more frequent meals, using aspiration and esophageal precautions. Could consider further esophageal w/u as his risk for aspiration appears to be greatest post-prandially.   SLP Visit Diagnosis Dysphagia, pharyngoesophageal phase (R13.14) Attention and concentration deficit following -- Frontal lobe and executive function deficit following -- Impact on safety and function Moderate aspiration risk   CHL IP TREATMENT RECOMMENDATION 03/10/2020 Treatment Recommendations Therapy as outlined in treatment plan below   Prognosis 03/10/2020 Prognosis for Safe Diet Advancement Fair Barriers to Reach Goals Time post onset;Other (Comment) Barriers/Prognosis Comment -- CHL IP DIET RECOMMENDATION 03/10/2020 SLP Diet Recommendations Dysphagia 2 (Fine chop) solids;Thin liquid Liquid Administration via Cup;Straw Medication Administration Crushed with puree Compensations Minimize environmental distractions;Slow rate;Small sips/bites;Follow solids with liquid Postural Changes Remain semi-upright after after feeds/meals (Comment);Seated upright at 90 degrees   CHL IP OTHER RECOMMENDATIONS 03/10/2020 Recommended Consults Consider esophageal assessment Oral Care Recommendations Oral care BID Other  Recommendations --   CHL IP FOLLOW UP RECOMMENDATIONS 03/10/2020 Follow up Recommendations 24 hour supervision/assistance   CHL IP FREQUENCY AND DURATION 03/10/2020 Speech Therapy Frequency (ACUTE ONLY) min 1 x/week Treatment Duration 2 weeks      CHL IP ORAL PHASE 03/10/2020 Oral Phase Impaired Oral - Pudding Teaspoon -- Oral - Pudding Cup -- Oral - Honey Teaspoon -- Oral - Honey Cup -- Oral - Nectar Teaspoon -- Oral - Nectar Cup -- Oral - Nectar Straw -- Oral - Thin Teaspoon -- Oral - Thin Cup Decreased bolus cohesion;Lingual/palatal residue;Holding of bolus Oral - Thin Straw Decreased bolus cohesion;Lingual/palatal residue;Premature spillage;Holding of bolus Oral - Puree Decreased bolus cohesion;Delayed oral transit;Holding of bolus Oral - Mech Soft Delayed oral transit;Decreased bolus cohesion;Impaired mastication Oral - Regular -- Oral - Multi-Consistency -- Oral - Pill -- Oral Phase - Comment --  CHL IP PHARYNGEAL PHASE 03/10/2020 Pharyngeal Phase Impaired Pharyngeal- Pudding Teaspoon -- Pharyngeal -- Pharyngeal- Pudding Cup -- Pharyngeal -- Pharyngeal- Honey Teaspoon -- Pharyngeal -- Pharyngeal- Honey Cup -- Pharyngeal -- Pharyngeal- Nectar Teaspoon -- Pharyngeal -- Pharyngeal- Nectar Cup -- Pharyngeal -- Pharyngeal- Nectar  Straw -- Pharyngeal -- Pharyngeal- Thin Teaspoon -- Pharyngeal -- Pharyngeal- Thin Cup Penetration/Aspiration during swallow Pharyngeal Material enters airway, remains ABOVE vocal cords then ejected out Pharyngeal- Thin Straw Penetration/Aspiration during swallow Pharyngeal Material enters airway, remains ABOVE vocal cords and not ejected out;Material enters airway, remains ABOVE vocal cords then ejected out Pharyngeal- Puree WFL Pharyngeal -- Pharyngeal- Mechanical Soft WFL Pharyngeal -- Pharyngeal- Regular -- Pharyngeal -- Pharyngeal- Multi-consistency -- Pharyngeal -- Pharyngeal- Pill -- Pharyngeal -- Pharyngeal Comment --  CHL IP CERVICAL ESOPHAGEAL PHASE 03/10/2020 Cervical Esophageal  Phase Impaired Pudding Teaspoon -- Pudding Cup -- Honey Teaspoon -- Honey Cup -- Nectar Teaspoon -- Nectar Cup -- Nectar Straw -- Thin Teaspoon -- Thin Cup Esophageal backflow into the pharynx Thin Straw Esophageal backflow into the pharynx Puree Esophageal backflow into the pharynx Mechanical Soft Esophageal backflow into the pharynx Regular -- Multi-consistency -- Pill -- Cervical Esophageal Comment -- Blake [name]., M.A. CCC-SLP Acute Rehabilitation Services Pager [phone] Office [phone] 03/10/2020, 12:34 PM                PHYSICAL EXAM  Temp:  [98 F (36.7 C)-98.5 F (36.9 C)] 98.5 F (36.9 C) (06/14 1200) Pulse Rate:  [33-126] 54 (06/14 1200) Resp:  [13-30] 22 (06/14 1200) BP: (142-198)/(58-96) 147/61 (06/14 1200) SpO2:  [70 %-100 %] 100 % (06/14 1200)  General - Well nourished, well developed, in no apparent distress.  Ophthalmologic - fundi not visualized due to noncooperation.  Cardiovascular - irregularly irregular heart rate and rhythm, with frequent bradycardia.  Mental Status -  Level of arousal and orientation to time, place, and person were intact. Language including expression, naming, repetition, comprehension was assessed and found intact. Mild dysarthria due to poor denture. Mild psychomotor slowing due to hard of hearing  Cranial Nerves II - XII - II - Visual field intact OU. III, IV, VI - Extraocular movements intact. V - Facial sensation intact bilaterally. VII - Facial movement intact bilaterally. VIII - hard of hearing & vestibular intact bilaterally. X - Palate elevates symmetrically. Poor denture, mild dysarthria XI - Chin turning & shoulder shrug intact bilaterally. XII - Tongue protrusion intact.  Motor Strength - The patient's strength was normal in all extremities and pronator drift was absent.  Bulk was normal and fasciculations were absent.   Motor Tone - Muscle tone was assessed at the neck and appendages and was normal.  Reflexes - The  patient's reflexes were symmetrical in all extremities and he had no pathological reflexes.  Sensory - Light touch, temperature/pinprick were assessed and were symmetrical.    Coordination - The patient had normal movements in the hands with no ataxia or dysmetria.  Tremor was absent.  Gait and Station - deferred.   ASSESSMENT/PLAN: Blake [name] is a 79 y.o. male with history of AF not on [facility], currently noncompliant w/ all meds presenting 03/06/20 withL sided weakness.Received tPA 03/06/2020 at 1719.  TIA vs. Aborted stroke vs. Seizure  CT head - No acute abnormality. Small vessel disease progressed since 2020. ASPECTS 10.   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 ICA/VA plaque w/o stenosis   MRI 6/12 -No acute intracranial abnormality.   2D Echo - EF 60 - 65%. No cardiac source of emboli identified.   EEG - neg sz, lateralized R brain  SARS - negative  UDS-benzo  LDL84  HgbA1c5.3  eliquis for VTE prophylaxis   aspirin 325 mg dailyprior to admission,  Now onEliquis for afib stroke prevention. Continue on discharge.  Therapy recommendations:CIR vs. SNF   Disposition: pending  (Lived alone PTA)  Transfer to the floor  Hx of seizure  Sz hx - on Keppra(not taking)  EEG neg sz, lateralized R brain - ?post ictal  OnKeppraXR1,000 mg daily  Atrial Fibrillation Bradycardia  Cardiologyon board  mobitz I second degree AV block. No acute indication for pacing. Avoid AV nodal agents.  HR down to 33 during the night  HR improves during wakefulness  Not on [facility] PTA  Now on eliquis for stroke prevention  Hx stroke/TIA  12/2018 - Incidental R caudate infarct not related to presenting symptoms, secondary to small vessel disease   All previous 2020 MRIs neg for stroke  Medical non-compliance  Hypertension  Home meds: Lasix 20   Treated with cardene post tPA, now  off  BPstable  Long-term BP goal normotensive  Hyperlipidemia  Home meds: lipitor 20 (not taking)  LDL 84, goal < 70  Resumelipitor 20  Continue statin at discharge  Dysphagia  MBSS - cleared for Dys 2 thin liquid diet  Speech on board  Tobacco abuse  Current smoker  Smoking cessation counseling provided  Pt is willing to quit  Other Stroke Risk Factors  Advanced age  ETOH abuse, alcohol level <10, advised to drink no more than 2 drink(s) a day  Other Active Problems  Metabolic encephalopathy w/ confusion in an alcoholic male w/ hx seizures. Likely Baseline dementia from 2020 note. On seroquel  GERD  CKD - stage 3b - (GFR - 36) - CRE 1.75->1.7-1.77-1.64  Protein calorie malnutrition, severe   Hospital day # 4    [name], MD PhD Stroke Neurology 03/10/2020 1:49 PM  To contact Stroke Continuity provider, please refer to [email or web address] After hours, contact General Neurology

## 2020-03-10 NOTE — Progress Notes (Signed)
Physical Therapy Treatment Patient Details Name: Blake [name] MRN: [id number] DOB: Jul 11, 1941 Today's Date: 03/10/2020    History of Present Illness 79 yo male presenting with acute onset of L weakness, L hemianopia, and R gaze preference. CT Head negative; MRI negative. Pt received TPA in ED. PMH includes: CVA, HTN, HLD, GERD, ETOH abuse, COPD, smoker.    PT Comments    Pt remains mod assist for transfers and attempts (not very successful attempts) at gait with RW.  He has absolutely no awareness of his deficits or inability to care for himself at home despite being oriented x 3.  I attempted a planned failure and despite these demonstrations of his lack of physical abilities he still thinks he can safely go home.  He is not safe to return home at this time. PT will continue to follow acutely for safe mobility progression.   Follow Up Recommendations  SNF     Equipment Recommendations  Other (comment) (pt has walker and WC at home, also a ramp)    Recommendations for Other Services   NA     Precautions / Restrictions Precautions Precautions: Fall Precaution Comments: R lateral lean in sitting and standing    Mobility  Bed Mobility Overal bed mobility: Needs Assistance Bed Mobility: Supine to Sit     Supine to sit: HOB elevated;Supervision     General bed mobility comments: Close supervision and heavy use of railing, once sitting R lateral lean  Transfers Overall transfer level: Needs assistance Equipment used: Rolling walker (2 wheeled);1 person hand held assist Transfers: Sit to/from [facility] Sit to Stand: Mod assist;Min assist Stand pivot transfers: Mod assist;Min assist       General transfer comment: Mod assist to stand and pivot to the recliner chair from the bed without AD min assist when RW used.    Ambulation/Gait Ambulation/Gait assistance: Mod assist Gait Distance (Feet): 3 Feet Assistive device: Rolling walker (2 wheeled) Gait  Pattern/deviations: Step-through pattern;Shuffle;Trunk flexed     General Gait Details: Attempted to have pt walk to the end of the bed with RW and he was able to take 1-2 Wilber choppy steps, but with mod assist to control him for balance and control the RW from sliding too far away from him.  He was unable to do much more and I attempted x 2.  He would need two person assist to safely try to walk across the room with an AD and likely chair to follow. He would be unable to attempt without an AD.     Modified Rankin (Stroke Patients Only) Modified Rankin (Stroke Patients Only) Pre-Morbid Rankin Score: Slight disability Modified Rankin: Moderately severe disability     Balance Overall balance assessment: Needs assistance Sitting-balance support: Feet supported;Bilateral upper extremity supported Sitting balance-Leahy Scale: Poor Sitting balance - Comments: R lateral lean in sitting, unable to remove bil hand prop. Unaware of his right lateral lean when asked.  Postural control: Right lateral lean Standing balance support: Bilateral upper extremity supported;During functional activity Standing balance-Leahy Scale: Poor Standing balance comment: needs external support from therapist and assistive device in standing.                             Cognition Arousal/Alertness: Awake/alert Behavior During Therapy: Impulsive Overall Cognitive Status: History of cognitive impairments - at baseline Area of Impairment: Memory;Safety/judgement;Awareness  Memory: Decreased Ruggiero-term memory ("i can't remember")   Safety/Judgement: Decreased awareness of safety;Decreased awareness of deficits Awareness: Intellectual   General Comments: Pt has no awareness of his current inability to care for himself.  Even after therapist's planned failure when he attempted to get himself from the chair to the bed and ended up falling into bed he would never conceed to the  fact that he would be unable to care for himself in his current physical state.          General Comments General comments (skin integrity, edema, etc.): Pt reports he would have no issues in his current state caring for himself at home and even after PT demonstrated his inability to transfer safely and inability to walk across the room he still reports he will be fine at home.        Pertinent Vitals/Pain Pain Assessment: No/denies pain Faces Pain Scale: No hurt           PT Goals (current goals can now be found in the care plan section) Acute Rehab PT Goals Patient Stated Goal: to go home Progress towards PT goals: Progressing toward goals    Frequency    Min 3X/week      PT Plan Current plan remains appropriate       AM-PAC PT "6 Clicks" Mobility   Outcome Measure  Help needed turning from your back to your side while in a flat bed without using bedrails?: None Help needed moving from lying on your back to sitting on the side of a flat bed without using bedrails?: A Little Help needed moving to and from a bed to a chair (including a wheelchair)?: A Lot Help needed standing up from a chair using your arms (e.g., wheelchair or bedside chair)?: A Lot Help needed to walk in hospital room?: A Lot Help needed climbing 3-5 steps with a railing? : Total 6 Click Score: 14    End of Session Equipment Utilized During Treatment: Gait belt Activity Tolerance: Patient tolerated treatment well Patient left: in chair;with call bell/phone within reach Nurse Communication: Mobility status PT Visit Diagnosis: Unsteadiness on feet (R26.81);Difficulty in walking, not elsewhere classified (R26.2);Muscle weakness (generalized) (M62.81)     Time: 4142-3953 PT Time Calculation (min) (ACUTE ONLY): 26 min  Charges:  $Therapeutic Activity: 23-37 mins                    Verdene Lennert, PT, DPT  Acute Rehabilitation [phone] pager #[phone] office     03/10/2020, 1:09  PM

## 2020-03-10 NOTE — Discharge Summary (Addendum)
Stroke Discharge Summary  Patient ID: ival pacer   MRN: [id number]      DOB: 1941/08/10  Date of Admission: 03/06/2020 Date of Discharge: 03/14/2020  Attending Physician:  [name], MD, Stroke MD Consultant(s):   Leeroy Cha, MD (Physical Medicine & Rehabilitation), Thompson Grayer, MD (electrophysiology)   Patient's PCP:  Patient, No Pcp Per  DISCHARGE DIAGNOSIS:  Principal Problem:   Stroke-like symptoms s/p tPA - ? TIA vs aborted stroke vs seizure Active Problems:   Type 2 diabetes mellitus (HCC)   GERD   ETOH abuse   Delirium   Hypertension   Hyperlipidemia   Protein-calorie malnutrition, severe   Seizure disorder (HCC)   Dysphagia   Leukocytosis   Allergies as of 03/14/2020   No Known Allergies     Medication List    STOP taking these medications   aspirin 325 MG tablet   furosemide 20 MG tablet Commonly known as: LASIX   gemfibrozil 600 MG tablet Commonly known as: LOPID   potassium chloride 10 MEQ tablet Commonly known as: KLOR-CON   QUEtiapine 25 MG tablet Commonly known as: SEROQUEL     TAKE these medications   apixaban 5 MG Tabs tablet Commonly known as: ELIQUIS Take 1 tablet (5 mg total) by mouth 2 (two) times daily.   atorvastatin 20 MG tablet Commonly known as: Lipitor Take 1 tablet (20 mg total) by mouth daily.   feeding supplement (ENSURE ENLIVE) Liqd Take 237 mLs by mouth 2 (two) times daily between meals. Start taking on: March 15, 7740   folic acid 1 MG tablet Commonly known as: FOLVITE Take 1 tablet (1 mg total) by mouth daily.   food thickener Powd Commonly known as: THICK IT Add to liquids to create a nectar thick consistency.   hydrALAZINE 20 MG/ML injection Commonly known as: APRESOLINE Inject 1 mL (20 mg total) into the vein every 4 (four) hours as needed (>180).   ipratropium-albuterol 0.5-2.5 (3) MG/3ML Soln Commonly known as: DUONEB Take 3 mLs by nebulization See admin instructions. Nebulize and inhale 3  ml's into the lungs at 6 AM, 2 PM, and 10 PM for 5 days with a start date of 07/06/18 AND 3 ml's every six hours as needed for coughing   levETIRAcetam 500 MG tablet Commonly known as: KEPPRA Take 1 tablet (500 mg total) by mouth 2 (two) times daily.   mouth rinse Liqd solution 15 mLs by Mouth Rinse route 2 (two) times daily.   multivitamin with minerals Tabs tablet Take 1 tablet by mouth daily.   pantoprazole 40 MG tablet Commonly known as: PROTONIX Take 1 tablet (40 mg total) by mouth at bedtime.   thiamine 100 MG tablet Take 1 tablet (100 mg total) by mouth daily.   Vitamin D-3 25 MCG (1000 UT) Caps Take 1 capsule (1,000 Units total) by mouth daily.       LABORATORY STUDIES CBC    Component Value Date/Time   WBC 10.9 (H) 03/11/2020 0723   RBC 3.77 (L) 03/11/2020 0723   HGB 11.7 (L) 03/11/2020 0723   HCT 35.5 (L) 03/11/2020 0723   PLT 221 03/11/2020 0723   MCV 94.2 03/11/2020 0723   MCH 31.0 03/11/2020 0723   MCHC 33.0 03/11/2020 0723   RDW 14.9 03/11/2020 0723   LYMPHSABS 1.0 03/06/2020 1648   MONOABS 0.9 03/06/2020 1648   EOSABS 0.0 03/06/2020 1648   BASOSABS 0.1 03/06/2020 1648   CMP    Component Value Date/Time  NA 139 03/11/2020 0723   K 3.6 03/11/2020 0723   CL 110 03/11/2020 0723   CO2 21 (L) 03/11/2020 0723   GLUCOSE 96 03/11/2020 0723   BUN 18 03/11/2020 0723   CREATININE 1.55 (H) 03/11/2020 0723   CALCIUM 8.4 (L) 03/11/2020 0723   PROT 6.8 03/06/2020 1648   ALBUMIN 3.7 03/06/2020 1648   AST 20 03/06/2020 1648   ALT 9 03/06/2020 1648   ALKPHOS 79 03/06/2020 1648   BILITOT 0.9 03/06/2020 1648   GFRNONAA 42 (L) 03/11/2020 0723   GFRAA 49 (L) 03/11/2020 0723   COAGS Lab Results  Component Value Date   INR 1.0 03/06/2020   INR 1.0 01/09/2019   INR 0.97 06/25/2018   Lipid Panel    Component Value Date/Time   CHOL 147 03/07/2020 0609   TRIG 78 03/07/2020 0609   HDL 47 03/07/2020 0609   CHOLHDL 3.1 03/07/2020 0609   VLDL 16 03/07/2020  0609   LDLCALC 84 03/07/2020 0609   HgbA1C  Lab Results  Component Value Date   HGBA1C 5.3 03/07/2020   Urinalysis    Component Value Date/Time   COLORURINE YELLOW 03/09/2020 1437   APPEARANCEUR HAZY (A) 03/09/2020 1437   LABSPEC 1.015 03/09/2020 1437   PHURINE 5.0 03/09/2020 1437   GLUCOSEU NEGATIVE 03/09/2020 1437   GLUCOSEU NEGATIVE 02/02/2011 1543   HGBUR LARGE (A) 03/09/2020 1437   BILIRUBINUR NEGATIVE 03/09/2020 1437   KETONESUR NEGATIVE 03/09/2020 1437   PROTEINUR 30 (A) 03/09/2020 1437   UROBILINOGEN 1.0 10/14/2014 1211   NITRITE NEGATIVE 03/09/2020 1437   LEUKOCYTESUR NEGATIVE 03/09/2020 1437   Urine Drug Screen     Component Value Date/Time   LABOPIA NONE DETECTED 03/09/2020 1437   COCAINSCRNUR NONE DETECTED 03/09/2020 1437   LABBENZ POSITIVE (A) 03/09/2020 1437   AMPHETMU NONE DETECTED 03/09/2020 1437   THCU NONE DETECTED 03/09/2020 1437   LABBARB NONE DETECTED 03/09/2020 1437    Alcohol Level    Component Value Date/Time   ETH <10 03/06/2020 1648    SIGNIFICANT DIAGNOSTIC STUDIES CT Code Stroke CTA Head W/WO contrast  Result Date: 03/06/2020 CLINICAL DATA:  79 year old male code stroke presentation. EXAM: CT ANGIOGRAPHY HEAD AND NECK TECHNIQUE: Multidetector CT imaging of the head and neck was performed using the standard protocol during bolus administration of intravenous contrast. Multiplanar CT image reconstructions and MIPs were obtained to evaluate the vascular anatomy. Carotid stenosis measurements (when applicable) are obtained utilizing NASCET criteria, using the distal internal carotid diameter as the denominator. CONTRAST:  137mL OMNIPAQUE IOHEXOL 350 MG/ML SOLN COMPARISON:  Plain head CT 1658 hours today. CTA head and neck 01/10/2019. FINDINGS: CTA NECK Skeleton: Absent dentition. Cervical spine degeneration. No acute osseous abnormality identified. Chronic posterior neck benign lipoma (series 14, image 88). Upper chest: Centrilobular emphysema with  stable mild apical scarring. Retained secretions in the trachea (series 5, image 159). Mild gas and bubbly opacity also in the proximal esophagus today. No superior mediastinal lymphadenopathy. Other neck: No acute findings. Aortic arch: Scattered calcified arch atherosclerosis. Three vessel arch configuration. Right carotid system: Minimal brachiocephalic and right CCA soft plaque without stenosis. At the bifurcation there is bulky calcified plaque which continues to the right ICA bulb although there is less than 50 % stenosis with respect to the distal vessel. Left carotid system: No left CCA stenosis despite mild plaque. Calcified plaque at the left ICA origin and bulb with less than 50 % stenosis with respect to the distal vessel is stable from last year.  Vertebral arteries: Calcified plaque near the right vertebral artery origin but no stenosis. The right vertebral is then normal to the skull base. Mild proximal left subclavian plaque without stenosis. Calcified plaque at the left vertebral artery origin with mild stenosis on series 13, image 153. Mildly non dominant left vertebral otherwise is normal to the skull base. CTA HEAD Posterior circulation: Normal vertebral artery V4 segments and vertebrobasilar junction. The right V4 is mildly dominant. Normal right PICA and dominant left AICA origin. Motion artifact at the level of the basilar which remains patent, no evidence of stenosis. SCA and left PCA origins are normal. Fetal type right PCA. Left posterior communicating artery diminutive or absent. Chronic severe bilateral PCA P2segment stenoses, worse on the left where the left P2 was thought to be occluded last year, unclear whether there is some distal reconstitution. Overall the PCAs appear stable since 2020. Anterior circulation: Motion artifact through both ICA siphons which remain patent. Grossly stable siphon calcified plaque compared to last year. Normal right posterior communicating artery origin.  Patent right carotid termini. Normal MCA and ACA origins. The left A1 appears mildly non dominant as before. Anterior communicating artery and bilateral ACA branches are within normal limits. Left MCA bifurcates early without stenosis. Left MCA branches are stable and within normal limits. Right MCA M1 and bifurcation are patent without stenosis. Right MCA branches are stable and within normal limits. Venous sinuses: Patent. Anatomic variants: Dominant right vertebral artery. Mildly dominant right ACA A1. Review of the MIP images confirms the above findings IMPRESSION: 1. Negative for emerge large vessel occlusion. Motion artifact at the skull base today but essentially stable CTA head and neck from April 2020. 2. Chronic severe bilateral PCA P2 stenoses, with chronic occlusion or near-occlusion on the left. 3. No significant carotid or vertebral artery stenosis despite calcified plaque. 4. Emphysema (ICD10-J43.9). These results were communicated to Dr. Leonel Ramsay at 5:54 pm on 03/06/2020 by text page via the [facility] messaging system. Electronically Signed   By: Genevie Ann M.D.   On: 03/06/2020 17:54   CT Code Stroke CTA Neck W/WO contrast  Result Date: 03/06/2020 CLINICAL DATA:  79 year old male code stroke presentation. EXAM: CT ANGIOGRAPHY HEAD AND NECK TECHNIQUE: Multidetector CT imaging of the head and neck was performed using the standard protocol during bolus administration of intravenous contrast. Multiplanar CT image reconstructions and MIPs were obtained to evaluate the vascular anatomy. Carotid stenosis measurements (when applicable) are obtained utilizing NASCET criteria, using the distal internal carotid diameter as the denominator. CONTRAST:  144mL OMNIPAQUE IOHEXOL 350 MG/ML SOLN COMPARISON:  Plain head CT 1658 hours today. CTA head and neck 01/10/2019. FINDINGS: CTA NECK Skeleton: Absent dentition. Cervical spine degeneration. No acute osseous abnormality identified. Chronic posterior neck benign  lipoma (series 14, image 88). Upper chest: Centrilobular emphysema with stable mild apical scarring. Retained secretions in the trachea (series 5, image 159). Mild gas and bubbly opacity also in the proximal esophagus today. No superior mediastinal lymphadenopathy. Other neck: No acute findings. Aortic arch: Scattered calcified arch atherosclerosis. Three vessel arch configuration. Right carotid system: Minimal brachiocephalic and right CCA soft plaque without stenosis. At the bifurcation there is bulky calcified plaque which continues to the right ICA bulb although there is less than 50 % stenosis with respect to the distal vessel. Left carotid system: No left CCA stenosis despite mild plaque. Calcified plaque at the left ICA origin and bulb with less than 50 % stenosis with respect to the distal vessel is stable from  last year. Vertebral arteries: Calcified plaque near the right vertebral artery origin but no stenosis. The right vertebral is then normal to the skull base. Mild proximal left subclavian plaque without stenosis. Calcified plaque at the left vertebral artery origin with mild stenosis on series 13, image 153. Mildly non dominant left vertebral otherwise is normal to the skull base. CTA HEAD Posterior circulation: Normal vertebral artery V4 segments and vertebrobasilar junction. The right V4 is mildly dominant. Normal right PICA and dominant left AICA origin. Motion artifact at the level of the basilar which remains patent, no evidence of stenosis. SCA and left PCA origins are normal. Fetal type right PCA. Left posterior communicating artery diminutive or absent. Chronic severe bilateral PCA P2segment stenoses, worse on the left where the left P2 was thought to be occluded last year, unclear whether there is some distal reconstitution. Overall the PCAs appear stable since 2020. Anterior circulation: Motion artifact through both ICA siphons which remain patent. Grossly stable siphon calcified plaque  compared to last year. Normal right posterior communicating artery origin. Patent right carotid termini. Normal MCA and ACA origins. The left A1 appears mildly non dominant as before. Anterior communicating artery and bilateral ACA branches are within normal limits. Left MCA bifurcates early without stenosis. Left MCA branches are stable and within normal limits. Right MCA M1 and bifurcation are patent without stenosis. Right MCA branches are stable and within normal limits. Venous sinuses: Patent. Anatomic variants: Dominant right vertebral artery. Mildly dominant right ACA A1. Review of the MIP images confirms the above findings IMPRESSION: 1. Negative for emerge large vessel occlusion. Motion artifact at the skull base today but essentially stable CTA head and neck from April 2020. 2. Chronic severe bilateral PCA P2 stenoses, with chronic occlusion or near-occlusion on the left. 3. No significant carotid or vertebral artery stenosis despite calcified plaque. 4. Emphysema (ICD10-J43.9). These results were communicated to Dr. Leonel Ramsay at 5:54 pm on 03/06/2020 by text page via the [facility] messaging system. Electronically Signed   By: Genevie Ann M.D.   On: 03/06/2020 17:54   MR BRAIN WO CONTRAST  Result Date: 03/07/2020 CLINICAL DATA:  79 year old male code stroke presentation yesterday. EXAM: MRI HEAD WITHOUT CONTRAST TECHNIQUE: Multiplanar, multiecho pulse sequences of the brain and surrounding structures were obtained without intravenous contrast. COMPARISON:  CT head, CTA head and neck yesterday. Brain MRI 01/10/2019. FINDINGS: Brain: No restricted diffusion to suggest acute infarction. No midline shift, mass effect, evidence of mass lesion, extra-axial collection or acute intracranial hemorrhage. Cervicomedullary junction and pituitary are within normal limits. Stable cerebral volume since 2020 along with mild to moderate lateral and 3rd ventriculomegaly which may be ex vacuo related. Patchy bilateral cerebral  white matter T2 and FLAIR hyperintensity has not significantly changed. No cortical encephalomalacia or chronic cerebral blood products identified. Mild for age T2 heterogeneity in the deep gray nuclei and pons is stable. Vascular: Major intracranial vascular flow voids are stable since 2020. Skull and upper cervical spine: Stable visible cervical spine including degenerative ligamentous hypertrophy about the odontoid. Normal bone marrow signal. Sinuses/Orbits: Stable and negative. Other: Mastoids remain clear. Visible internal auditory structures appear normal. Scalp and face soft tissues appear negative. IMPRESSION: 1. No acute intracranial abnormality. 2. Stable MRI appearance of the brain since 2020, chronic nonspecific white matter signal changes and mild to moderate lateral and 3rd ventricular enlargement. Electronically Signed   By: Genevie Ann M.D.   On: 03/07/2020 17:25   DG CHEST PORT 1 VIEW  Result Date:  03/06/2020 CLINICAL DATA:  Leukocytosis EXAM: PORTABLE CHEST 1 VIEW COMPARISON:  07/17/2018 FINDINGS: A 2 stable cardiac silhouette. Frontal views of the chest demonstrate the chronic areas of scarring are seen throughout the lungs without airspace disease, effusion, or pneumothorax. Numerous prior healed bilateral rib fractures. No acute bony abnormalities. IMPRESSION: 1. No acute intrathoracic process. Electronically Signed   By: Randa Ngo M.D.   On: 03/06/2020 21:08   DG Swallowing Func-Speech Pathology  Result Date: 03/10/2020 Objective Swallowing Evaluation: Type of Study: MBS-Modified Barium Swallow Study  Patient Details Name: NUNZIO BANET MRN: [id number] Date of Birth: 04/24/1941 Today's Date: 03/10/2020 Time: SLP Start Time (ACUTE ONLY): 1114 -SLP Stop Time (ACUTE ONLY): 9323 SLP Time Calculation (min) (ACUTE ONLY): 20 min Past Medical History: Past Medical History: Diagnosis Date . Chronic insomnia  . Cigarette smoker  . COPD (chronic obstructive pulmonary disease) ([place])  . Diverticulosis  of colon  . Elevated prostate specific antigen (PSA)  . ETOH abuse  . GERD (gastroesophageal reflux disease)  . Hx of colonic polyps  . Hyperlipidemia  . Hypertension  . Pulmonary nodule  . Shortness of breath  . Stroke [facility])  Past Surgical History: Past Surgical History: Procedure Laterality Date . COLONOSCOPY   . ESOPHAGOGASTRODUODENOSCOPY (EGD) WITH PROPOFOL N/A 07/21/2018  Procedure: ESOPHAGOGASTRODUODENOSCOPY (EGD) WITH PROPOFOL;  Surgeon: Lavena Bullion, DO;  Location: [place];  Service: Gastroenterology;  Laterality: N/A; . needle biopsy RUL nodule  01/2009  benign . TONSILLECTOMY  1947 . VASECTOMY   HPI: Pt is a 79 yo male presenting with acute onset of L weakness, L hemianopia, and R gaze preference. CT Head negative; MRI pending. MBS 07/15/18 primary esophageal dysphagia, backflow from esophagus into pharynx, residue throughout esophagus. Flash penetration of thin. MBS 01/10/19 with mild oral dysphagia and persistent esophageal dysphagia. PMH includes: CVA, HTN, HLD, GERD, ETOH abuse, COPD, smoker, HH, esophageal dilation 2019, tortuous esophagus  Subjective: alert, cooperative, HOH Assessment / Plan / Recommendation CHL IP CLINICAL IMPRESSIONS 03/10/2020 Clinical Impression Pt's swallow function appears to be grossly similar to previous MBS, with what apepars to be a primary esophageal dysphagia. He has a prolonged oral phase with decreased bolus cohesion, losing some of the boluses sublingually with mild residue remaining, but clearing this well with a spontaneous second swallow. He had a single instance of premature spillage when drinking thin liquids via straw, which appeared to penetrate to the vocal folds but fully clear (visibility below the vocal folds was reduced by pt positioning in that moment, but no barium was noted in his trachea or laryngeal vestibule after). Otherwise he would have intermittent, trace penetration that would clear as he continued to swallow. Towards the end of testing  pt did exhibit coughing as he has been demonstrating upstairs, but this was a result of backflow from the esophagus entering into his pharynx/pyriform sinuses. There was no backflow into the larynx on this study, but his esophagus appeared to be filled with barium. Recommend Dys 2 diet and thin liquids in smaller, more frequent meals, using aspiration and esophageal precautions. Could consider further esophageal w/u as his risk for aspiration appears to be greatest post-prandially.   SLP Visit Diagnosis Dysphagia, pharyngoesophageal phase (R13.14) Attention and concentration deficit following -- Frontal lobe and executive function deficit following -- Impact on safety and function Moderate aspiration risk   CHL IP TREATMENT RECOMMENDATION 03/10/2020 Treatment Recommendations Therapy as outlined in treatment plan below   Prognosis 03/10/2020 Prognosis for Safe Diet Advancement Fair Barriers to Reach  Goals Time post onset;Other (Comment) Barriers/Prognosis Comment -- CHL IP DIET RECOMMENDATION 03/10/2020 SLP Diet Recommendations Dysphagia 2 (Fine chop) solids;Thin liquid Liquid Administration via Cup;Straw Medication Administration Crushed with puree Compensations Minimize environmental distractions;Slow rate;Small sips/bites;Follow solids with liquid Postural Changes Remain semi-upright after after feeds/meals (Comment);Seated upright at 90 degrees   CHL IP OTHER RECOMMENDATIONS 03/10/2020 Recommended Consults Consider esophageal assessment Oral Care Recommendations Oral care BID Other Recommendations --   CHL IP FOLLOW UP RECOMMENDATIONS 03/10/2020 Follow up Recommendations 24 hour supervision/assistance   CHL IP FREQUENCY AND DURATION 03/10/2020 Speech Therapy Frequency (ACUTE ONLY) min 1 x/week Treatment Duration 2 weeks      CHL IP ORAL PHASE 03/10/2020 Oral Phase Impaired Oral - Pudding Teaspoon -- Oral - Pudding Cup -- Oral - Honey Teaspoon -- Oral - Honey Cup -- Oral - Nectar Teaspoon -- Oral - Nectar Cup -- Oral -  Nectar Straw -- Oral - Thin Teaspoon -- Oral - Thin Cup Decreased bolus cohesion;Lingual/palatal residue;Holding of bolus Oral - Thin Straw Decreased bolus cohesion;Lingual/palatal residue;Premature spillage;Holding of bolus Oral - Puree Decreased bolus cohesion;Delayed oral transit;Holding of bolus Oral - Mech Soft Delayed oral transit;Decreased bolus cohesion;Impaired mastication Oral - Regular -- Oral - Multi-Consistency -- Oral - Pill -- Oral Phase - Comment --  CHL IP PHARYNGEAL PHASE 03/10/2020 Pharyngeal Phase Impaired Pharyngeal- Pudding Teaspoon -- Pharyngeal -- Pharyngeal- Pudding Cup -- Pharyngeal -- Pharyngeal- Honey Teaspoon -- Pharyngeal -- Pharyngeal- Honey Cup -- Pharyngeal -- Pharyngeal- Nectar Teaspoon -- Pharyngeal -- Pharyngeal- Nectar Cup -- Pharyngeal -- Pharyngeal- Nectar Straw -- Pharyngeal -- Pharyngeal- Thin Teaspoon -- Pharyngeal -- Pharyngeal- Thin Cup Penetration/Aspiration during swallow Pharyngeal Material enters airway, remains ABOVE vocal cords then ejected out Pharyngeal- Thin Straw Penetration/Aspiration during swallow Pharyngeal Material enters airway, remains ABOVE vocal cords and not ejected out;Material enters airway, remains ABOVE vocal cords then ejected out Pharyngeal- Puree WFL Pharyngeal -- Pharyngeal- Mechanical Soft WFL Pharyngeal -- Pharyngeal- Regular -- Pharyngeal -- Pharyngeal- Multi-consistency -- Pharyngeal -- Pharyngeal- Pill -- Pharyngeal -- Pharyngeal Comment --  CHL IP CERVICAL ESOPHAGEAL PHASE 03/10/2020 Cervical Esophageal Phase Impaired Pudding Teaspoon -- Pudding Cup -- Honey Teaspoon -- Honey Cup -- Nectar Teaspoon -- Nectar Cup -- Nectar Straw -- Thin Teaspoon -- Thin Cup Esophageal backflow into the pharynx Thin Straw Esophageal backflow into the pharynx Puree Esophageal backflow into the pharynx Mechanical Soft Esophageal backflow into the pharynx Regular -- Multi-consistency -- Pill -- Cervical Esophageal Comment -- Osie Bond., M.A. [facility] Pager [phone] Office [phone] 03/10/2020, 12:34 PM              EEG adult  Result Date: 03/06/2020 Lora Havens, MD     03/06/2020  8:50 PM Patient Name: MICHAE GRIMLEY MRN: [id number] l Epilepsy Attending: Lora Havens Referring Physician/Provider: Dr Roland Rack Date: 03/06/2020 Duration: 27.55 mins Patient history: 79 y.o. male with a history of atrial fibrillation, not currently taking any medications who presents with left-sided weakness that started abruptly, left hemianopia and right gaze preference . EEG to evaluate for seizure. Level of alertness:  lethargic AEDs during EEG study: Keppra, ativan Technical aspects: This EEG study was done with scalp electrodes positioned according to the 10-20 International system of electrode placement. Electrical activity was acquired at a sampling rate of 500Hz  and reviewed with a high frequency filter of 70Hz  and a low frequency filter of 1Hz . EEG data were recorded continuously and digitally stored. Description: No clear posterior dominant rhythm was seen. EEG  showed continuous 3-5hz  theta- delta slowing , at times rhythmic in right hemisphere as well as polymorphic mixed frequencies predominantly with 5-8Hz  theta-alpha activity as well as intermittent 2-3hz  delta activity in left hemisphere. Sharp transients were also noted in right temporal region. Hyperventilation and photic stimulation were not performed.   Of note, study was technically difficult due to significant movement artifact. ABNORMALITY -Continuous slow, generalized and lateralized right hemisphere IMPRESSION: This technically difficult study is suggestive of cortical dysfunction in right hemisphere likely secondary to underlying structural abnormality, post-ictal state. Additionally, there is evidence of moderate diffuse encephalopathy, nonspecific etiology.  No seizures or definite epileptiform discharges were seen throughout the recording. If suspicion  for interictal activity remains a concern, a prolonged study including sleep can be considered. Lora Havens   ECHOCARDIOGRAM COMPLETE  Result Date: 03/07/2020    ECHOCARDIOGRAM REPORT   Patient Name:   ERHARDT DADA Deboy Date of Exam: 03/07/2020 Medical Rec #:  [id number]     Height:       70.0 in Accession #:    [id number]    Weight:       141.8 lb Date of Birth:  02-21-1941     BSA:          1.803 m Patient Age:    38 years      BP:           133/73 mmHg Patient Gender: M             HR:           35 bpm. Exam Location:  Inpatient Procedure: 2D Echo, Cardiac Doppler, Color Doppler and Intracardiac            Opacification Agent Indications:    Stroke  History:        Patient has prior history of Echocardiogram examinations, most                 recent 01/10/2019. COPD, Arrythmias:Atrial Fibrillation; Risk                 Factors:Current Smoker, Dyslipidemia and Diabetes. ETOH abuse.  Sonographer:    Clayton Lefort RDCS (AE) Referring Phys: [phone] MCNEILL P [facility]  Sonographer Comments: Technically challenging study due to limited acoustic windows, Technically difficult study due to poor echo windows, suboptimal parasternal window, suboptimal apical window and no subcostal window. Image acquisition challenging due to COPD. IMPRESSIONS  1. Left ventricular ejection fraction, by estimation, is 55 to 60%. The left ventricle has normal function. Left ventricular endocardial border not optimally defined to evaluate regional wall motion. Left ventricular diastolic parameters are indeterminate.  2. Right ventricular systolic function was not well visualized. The right ventricular size is normal.  3. The mitral valve was not well visualized. No evidence of mitral valve regurgitation.  4. The aortic valve was not well visualized. Aortic valve regurgitation is not visualized. No aortic stenosis is present. Conclusion(s)/Recommendation(s): No intracardiac source of embolism detected on this transthoracic study. A  transesophageal echocardiogram is recommended to exclude cardiac source of embolism if clinically indicated. FINDINGS  Left Ventricle: Left ventricular ejection fraction, by estimation, is 55 to 60%. The left ventricle has normal function. Left ventricular endocardial border not optimally defined to evaluate regional wall motion. Definity contrast agent was given IV to delineate the left ventricular endocardial borders. The left ventricular internal cavity size was normal in size. There is no left ventricular hypertrophy. Left ventricular diastolic parameters are indeterminate. Right Ventricle: The right ventricular size  is normal. No increase in right ventricular wall thickness. Right ventricular systolic function was not well visualized. Left Atrium: Left atrial size was not well visualized. Right Atrium: Right atrial size was not well visualized. Pericardium: There is no evidence of pericardial effusion. Mitral Valve: The mitral valve was not well visualized. No evidence of mitral valve regurgitation. Tricuspid Valve: The tricuspid valve is not well visualized. Tricuspid valve regurgitation is not demonstrated. Aortic Valve: The aortic valve was not well visualized. Aortic valve regurgitation is not visualized. No aortic stenosis is present. Aortic valve mean gradient measures 1.3 mmHg. Aortic valve peak gradient measures 2.0 mmHg. Pulmonic Valve: The pulmonic valve was not well visualized. Pulmonic valve regurgitation is not visualized. Aorta: The aortic root was not well visualized. IAS/Shunts: The interatrial septum was not well visualized.  RIGHT VENTRICLE RV Basal diam:  2.80 cm RV S prime:     8.04 cm/s TAPSE (M-mode): 1.6 cm LEFT ATRIUM             Index       RIGHT ATRIUM           Index LA Vol (A2C):   48.0 ml 26.62 ml/m RA Area:     15.20 cm LA Vol (A4C):   50.7 ml 28.12 ml/m RA Volume:   36.70 ml  20.35 ml/m LA Biplane Vol: 52.4 ml 29.06 ml/m  AORTIC VALVE AV Vmax:           71.13 cm/s AV Vmean:           53.833 cm/s AV VTI:            0.185 m AV Peak Grad:      2.0 mmHg AV Mean Grad:      1.3 mmHg LVOT Vmax:         56.80 cm/s LVOT Vmean:        39.960 cm/s LVOT VTI:          0.153 m LVOT/AV VTI ratio: 0.82  SHUNTS Systemic VTI: 0.15 m Candee Furbish MD Electronically signed by Candee Furbish MD Signature Date/Time: 03/07/2020/3:48:34 PM    Final    CT HEAD CODE STROKE WO CONTRAST  Result Date: 03/06/2020 CLINICAL DATA:  Code stroke.  79 year old male EXAM: CT HEAD WITHOUT CONTRAST TECHNIQUE: Contiguous axial images were obtained from the base of the skull through the vertex without intravenous contrast. COMPARISON:  Brain MRI 01/10/2019. Head CT 06/25/2018. CTA head and neck 01/10/2019. FINDINGS: Brain: Patchy and confluent bilateral cerebral white matter hypodensity appears progressed since 2020. Cerebral volume is not significantly changed. No midline shift, mass effect, or evidence of intracranial mass lesion. Mild ventriculomegaly, probably ex vacuo. No acute intracranial hemorrhage identified. No cortically based acute infarct identified. No cortical encephalomalacia identified. Vascular: Calcified atherosclerosis at the skull base. No suspicious intracranial vascular hyperdensity. Skull: Osteopenia.  No acute osseous abnormality identified. Sinuses/Orbits: Mild new right sphenoid sinus mucosal thickening, otherwise stable and well pneumatized. Other: No acute orbit or scalp soft tissue findings. ASPECTS [facility] Stroke Program Early CT Score) Total score (0-10 with 10 being normal): 10 IMPRESSION: 1. No acute cortically based infarct or acute intracranial hemorrhage identified. ASPECTS 10. 2. Progressed bilateral cerebral white matter disease since 2020. 3. These results were communicated to Dr. Leonel Ramsay at 5:10 pm on 6/10/2021by text page via the [facility] messaging system. Electronically Signed   By: Genevie Ann M.D.   On: 03/06/2020 17:10      HISTORY OF PRESENT ILLNESS Jayen Bromwell Lenhoff is  a 79 y.o. male  with a history of atrial fibrillation, not currently taking any medications who presents with left-sided weakness that started abruptly.  Family talked to him on the phone around 3 PM on 03/06/2020, and at that time he was normal, no concerns based on the conversation.  Subsequently, they found him few hours later on the floor, though it did not appear that he had fallen.  He was brought into the emergency department as a code stroke. No LVO. Treated w/ IV tPA.    HOSPITAL COURSE Mr. RANSON BELLUOMINI is a 79 y.o. male with history of AF not on [facility], currently noncompliant w/ all meds presenting 03/06/20 with L sided weakness. Received tPA 03/06/2020 at 1719.  TIA vs. Aborted stroke vs. Seizure  CT head - No acute abnormality. Small vessel disease progressed since 2020. ASPECTS 10.   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 ICA/VA plaque w/o stenosis   MRI 6/12 -No acute intracranial abnormality.   2D Echo - EF 60 - 65%. No cardiac source of emboli identified.   EEG - neg sz, lateralized R brain  SARS - negative  UDS-benzo  LDL84  HgbA1c5.3  aspirin 325 mg dailyprior to admission, now onEliquis for afib stroke prevention. Continue on discharge.  Therapy recommendations: SNF  Disposition:SNF(Lived alone PTA)  COVID neg 6/17 for SNF placement  Hx of seizure  Sz hx - on Keppra(not taking)  EEG neg sz, lateralized R brain - ?post ictal  OnKeppraXR1,000 mg daily  Atrial Fibrillation Bradycardia  Cardiologyconsulted  mobitz I second degree AV block. No acute indication for pacing. Avoid AV nodal agents.  HR down to 33 during the night  HR improves during wakefulness  Not on [facility] PTA  Now on eliquis for stroke prevention  Hx stroke/TIA  12/2018 - Incidental R caudate infarct not related to presenting symptoms, secondary to small vessel disease   All previous 2020 MRIs neg for stroke  Medical  non-compliance  Hypertension  Home meds: Lasix 20   Treated with cardene post tPA, now off  BPstable  Long-term BP goal normotensive  Hyperlipidemia  Home meds: lipitor 20 (not taking)  LDL 84, goal < 70  Resumelipitor 20  Continue statin at discharge  Dysphagia Protein calorie malnutrition, severe  MBSS - cleared for Dys 2 thin liquid diet  Ensure Enlive  Tobacco abuse  Current smoker  Smoking cessation counseling provided  Pt is willing to quit  Other Stroke Risk Factors  Advanced age  ETOH abuse, alcohol level <10, advised to drink no more than 2 drink(s) a day  Other Active Problems  Metabolic encephalopathy w/ confusion in an alcoholic male w/ hx seizures. Likely Baseline dementia from 2020 note. On seroquel  GERD  CKD - stage 3b   leukocytosis - WBC 10.9   DISCHARGE EXAM Blood pressure (!) 148/80, pulse 63, temperature 98.4 F (36.9 C), temperature source Oral, resp. rate 16, height 5\' 10"  (1.778 m), weight 64.3 kg, SpO2 96 %. General - Well nourished, well developed, in no apparent distress.  Ophthalmologic - fundi not visualized due to noncooperation.  Cardiovascular - irregularly irregular heart rate and rhythm, with frequent bradycardia.  Mental Status -  Level of arousal and orientation to time, place, and person were intact. Language including expression, naming, repetition, comprehension was assessed and found intact. Mild dysarthria due to poor denture. Mild psychomotor slowing due to hard of hearing  Cranial Nerves II -  XII - II - Visual field intact OU. III, IV, VI - Extraocular movements intact. V - Facial sensation intact bilaterally. VII - Facial movement intact bilaterally. VIII - hard of hearing & vestibular intact bilaterally. X - Palate elevates symmetrically. Poor denture, mild dysarthria XI - Chin turning & shoulder shrug intact bilaterally. XII - Tongue protrusion intact.  Motor Strength - The  patient's strength was normal in all extremities and pronator drift was absent.  Bulk was normal and fasciculations were absent.   Motor Tone - Muscle tone was assessed at the neck and appendages and was normal.  Reflexes - The patient's reflexes were symmetrical in all extremities and he had no pathological reflexes.  Sensory - Light touch, temperature/pinprick were assessed and were symmetrical.    Coordination - The patient had normal movements in the hands with no ataxia or dysmetria.  Tremor was absent.  Gait and Station - deferred.  Discharge Diet   Dysphagia 2 thin liquids  DISCHARGE PLAN  Disposition:  Skilled nursing facility for ongoing PT, OT and ST.   Eliquis (apixaban) daily for secondary stroke prevention given AF  Ongoing stroke risk factor control by Primary Care Physician at time of discharge  Follow-up PCP in 2 weeks. Get one if you do not have  Follow-up in [facility] in 4 weeks, office to schedule an appointment.   40 minutes were spent preparing discharge.  [name], MD PhD Stroke Neurology 03/15/2020 1:26 AM

## 2020-03-10 NOTE — Progress Notes (Signed)
Initial Nutrition Assessment  DOCUMENTATION CODES:   Severe malnutrition in context of chronic illness  INTERVENTION:   Ensure Enlive po BID, each supplement provides 350 kcal and 20 grams of protein  Magic cup TID with meals, each supplement provides 290 kcal and 9 grams of protein  Encourage PO intake   NUTRITION DIAGNOSIS:   Severe Malnutrition related to chronic illness (COPD, ETOH abuse) as evidenced by severe muscle depletion, severe fat depletion. Ongoing.   GOAL:   Patient will meet greater than or equal to 90% of their needs Progressing  MONITOR:   PO intake, Supplement acceptance  REASON FOR ASSESSMENT:   Malnutrition Screening Tool    ASSESSMENT:   Pt with PMH of Afib, smoker, COPD, ETOH abuse, GERD, HLD, HTN, CVA, and dysphagia, tortuous esophagus s/p esophageal dilation 2019 admitted with acute onset L weakness and R gaze preference CVA suspected. CT negative, MRI pending.   Pt discussed during ICU rounds and with RN.  Pt lives alone, POA helping to establish d/c plan. PT recommends SNF.  Per MD pt noncompliant with all medications.   6/14 s/p MBS; diet advanced  Medications reviewed and include: thiamine  Labs reviewed      Diet Order:   Diet Order            DIET DYS 2 Room service appropriate? Yes with Assist; Fluid consistency: Thin  Diet effective now                 EDUCATION NEEDS:   No education needs have been identified at this time  Skin:  Skin Assessment: Reviewed RN Assessment  Last BM:  6/13; type 4  Height:   Ht Readings from Last 1 Encounters:  03/07/20 5\' 10"  (1.778 m)    Weight:   Wt Readings from Last 1 Encounters:  03/07/20 64.3 kg    Ideal Body Weight:     BMI:  Body mass index is 20.34 kg/m.  Estimated Nutritional Needs:   Kcal:  1800-2000  Protein:  100-115 grams  Fluid:  >1.8 L/day  Lockie Pares., RD, LDN, CNSC See AMiON for contact information

## 2020-03-10 NOTE — Progress Notes (Signed)
Rehab Admissions Coordinator Note:  Per OT recommendation, this patient was screened by Raechel Ache for appropriateness for an Inpatient Acute Rehab Consult.  Noted pt has minimal awareness of his deficits and wants to go straight home. Feel he would benefit from post acute rehab IF he is agreeable to it. Please have attending service place consult order IF pt is open to CIR. *Pt will also most likely need close supervision at DC to qualify for CIR. If that can not be secured, pt will need SNF.  Please contact me for questions.   Raechel Ache 03/10/2020, 6:26 PM  I can be reached at [phone].

## 2020-03-10 NOTE — Progress Notes (Signed)
PT Cancellation Note  Patient Details Name: Blake [name] MRN: [id number] DOB: Aug 08, 1941   Cancelled Treatment:    Reason Eval/Treat Not Completed: Patient at procedure or test/unavailable.  Pt headed to modified barium swallow assessment. PT to check back later.  Thanks,  Verdene Lennert, PT, DPT  Acute Rehabilitation [phone] pager #[phone] office       [name] 03/10/2020, 10:52 AM

## 2020-03-10 NOTE — Progress Notes (Signed)
  Speech Language Pathology Treatment: Dysphagia  Patient Details Name: Blake [name] MRN: [id number] DOB: [birth year] Today's Date: 03/10/2020 Time: 1749-4496 SLP Time Calculation (min) (ACUTE ONLY): 17 min  Assessment / Plan / Recommendation Clinical Impression  Pt is much more alert today but continues to demonstrate immediate and delayed coughing with thin liquids, concerning for decreased airway protection. Pt reports feeling "strangled" with thin liquids, but there are no overt s/s of aspiration with any other consistencies, including advanced solids. Min cues were provided for use of aspiration/esophageal precautions but this did not reduce coughing with thin liquids. He has minimally prolonged mastication, wearing only his upper dentures. He believes that his POA, Joseangel, will be bringing in his lower dentures today. Continue to suspect a more chronic esophageal dysphagia, but with an increase in potential signs of aspiration that warrants further investigation. Recommend advancing diet to Dys 2 (chopped) diet, but would continue nectar thick liquids pending completion of MBS, scheduled for later this morning with radiology.    HPI HPI: Pt is a 79 yo male presenting with acute onset of L weakness, L hemianopia, and R gaze preference. CT Head negative; MRI pending. MBS 07/15/18 primary esophageal dysphagia, backflow from esophagus into pharynx, residue throughout esophagus. Flash penetration of thin. MBS 01/10/19 with mild oral dysphagia and persistent esophageal dysphagia. PMH includes: CVA, HTN, HLD, GERD, ETOH abuse, COPD, smoker, HH, esophageal dilation 2019, tortuous esophagus      SLP Plan  MBS       Recommendations  Diet recommendations: Dysphagia 2 (fine chop);Nectar-thick liquid Liquids provided via: Cup;No straw Medication Administration: Crushed with puree Supervision: Patient able to self feed;Full supervision/cueing for compensatory strategies Compensations: Slow rate;Small  sips/bites;Follow solids with liquid Postural Changes and/or Swallow Maneuvers: Seated upright 90 degrees;Upright 30-60 min after meal                Oral Care Recommendations: Oral care BID Follow up Recommendations: 24 hour supervision/assistance SLP Visit Diagnosis: Dysphagia, unspecified (R13.10) Plan: MBS       GO                 Osie Bond., M.A. [facility] Pager [phone] Office [phone]  03/10/2020, 10:42 AM

## 2020-03-10 NOTE — Evaluation (Signed)
Occupational Therapy Evaluation Patient Details Name: Blake [name] MRN: [id number] DOB: [birth year] Today's Date: 03/10/2020    History of Present Illness 79 yo male presenting with acute onset of L weakness, L hemianopia, and R gaze preference. CT Head negative; MRI negative. Pt received TPA in ED. PMH includes: CVA, HTN, HLD, GERD, ETOH abuse, COPD, smoker.   Clinical Impression   Pt admitted with above. He demonstrates the below listed deficits and will benefit from continued OT to maximize safety and independence with BADLs.  Pt presents to OT with impaired balance, decreased activity tolerance, impaired cognition including deficits with memory, problem solving, awareness, and attention. Marland Kitchen  He requires min - max A for ADLs.  He lives alone, and was independent with ADLs PTA. He does not drive, and has a friend, Veto, who helps him occasionally.  Pt demonstrates no awareness of deficits, and their impact on his function, and he becomes very irritable when he struggles with a task.  Currently, he not safe to return home without 24/7 assist.  Recommend post acute rehab.  Will follow.        Follow Up Recommendations  CIR;Supervision/Assistance - 24 hour    Equipment Recommendations  Tub/shower seat;3 in 1 bedside commode    Recommendations for Other Services Rehab consult     Precautions / Restrictions Precautions Precautions: Fall Precaution Comments: R lateral lean in sitting and standing      Mobility Bed Mobility Overal bed mobility: Needs Assistance Bed Mobility: Supine to Sit     Supine to sit: HOB elevated;Supervision     General bed mobility comments: Pt sitting up in chair   Transfers Overall transfer level: Needs assistance Equipment used: Rolling walker (2 wheeled);1 person hand held assist Transfers: Sit to/from Stand Sit to Stand: Mod assist Stand pivot transfers: Mod assist;Min assist       General transfer comment: Mod assist to stand and pivot to the  recliner chair from the bed without AD min assist when RW used.      Balance Overall balance assessment: Needs assistance Sitting-balance support: Feet supported;Bilateral upper extremity supported Sitting balance-Leahy Scale: Poor Sitting balance - Comments: Pt demonstrates heavy Rt lateral lean with no awareness of such  Postural control: Right lateral lean Standing balance support: Bilateral upper extremity supported;During functional activity Standing balance-Leahy Scale: Poor Standing balance comment: needs external support from therapist and assistive device in standing.                            ADL either performed or assessed with clinical judgement   ADL Overall ADL's : Needs assistance/impaired Eating/Feeding: Set up;Supervision/ safety;Sitting Eating/Feeding Details (indicate cue type and reason): heavy Rt lateral lean  Grooming: Wash/dry hands;Wash/dry face;Brushing hair;Set up;Supervision/safety;Sitting   Upper Body Bathing: Supervision/ safety;Set up;Sitting   Lower Body Bathing: Moderate assistance;Sit to/from stand Lower Body Bathing Details (indicate cue type and reason): Pt struggles to access feet and demonstrates increased DOE.  Assist to stand to bathe peri area  Upper Body Dressing : Minimal assistance;Sitting Upper Body Dressing Details (indicate cue type and reason): heavy Rt lateral lean  Lower Body Dressing: Maximal assistance;Sit to/from stand Lower Body Dressing Details (indicate cue type and reason): Pt is able to don Lt sock after several attempts and multiple rest breaks.  DOE 3/4.  He was unable to don Rt sock and becomes irritable when things are challenging  Toilet Transfer: Moderate assistance;Stand-pivot;BSC;RW   Toileting- Clothing Manipulation  and Hygiene: Maximal assistance;Sit to/from stand       Functional mobility during ADLs: Moderate assistance;Rolling walker       Vision   Additional Comments: Pt locates items on Lt and  Rt. He refused to engage in formal assessment      Perception Perception Perception Tested?: Yes   Praxis Praxis Praxis tested?: Within functional limits    Pertinent Vitals/Pain Pain Assessment: No/denies pain Faces Pain Scale: No hurt     Hand Dominance Right   Extremity/Trunk Assessment Upper Extremity Assessment Upper Extremity Assessment: Generalized weakness   Lower Extremity Assessment Lower Extremity Assessment: Defer to PT evaluation   Cervical / Trunk Assessment Cervical / Trunk Assessment: Kyphotic;Other exceptions (Rt lateral lean )   Communication Communication Communication: HOH   Cognition Arousal/Alertness: Awake/alert Behavior During Therapy: Impulsive Overall Cognitive Status: Impaired/Different from baseline Area of Impairment: Attention;Memory;Following commands;Safety/judgement;Awareness;Problem solving                   Current Attention Level: Selective Memory: Decreased Carmean-term memory Following Commands: Follows one step commands consistently;Follows multi-step commands with increased time;Follows multi-step commands inconsistently Safety/Judgement: Decreased awareness of deficits;Decreased awareness of safety Awareness: Intellectual (but questions if he had a stroke ) Problem Solving: Slow processing;Difficulty sequencing;Requires verbal cues;Requires tactile cues General Comments: Pt demonstrates very poor awareness of current deficits and is unable to recognize deficits even as they are occuring    General Comments  Pt insisting that he can discharge home alone     Exercises     Shoulder Instructions      Home Living Family/patient expects to be discharged to:: Private residence Living Arrangements: Alone Available Help at Discharge: Family;Friend(s);Available PRN/intermittently Type of Home: Mobile home Home Access: Level entry     Home Layout: One level     Bathroom Shower/Tub: Occupational psychologist:  Standard     Home Equipment: None   Additional Comments: Pt reports he lives alone and his friend "Jaryd" assists him PRN and provides transportation       Prior Functioning/Environment Level of Independence: Independent        Comments: Pt reports he does not drive         OT Problem List: Decreased activity tolerance;Impaired vision/perception;Decreased cognition;Decreased safety awareness;Decreased knowledge of use of DME or AE;Cardiopulmonary status limiting activity      OT Treatment/Interventions: Self-care/ADL training;Therapeutic exercise;Neuromuscular education;DME and/or AE instruction;Therapeutic activities;Cognitive remediation/compensation;Visual/perceptual remediation/compensation;Patient/family education;Balance training    OT Goals(Current goals can be found in the care plan section) Acute Rehab OT Goals Patient Stated Goal: to go home ASAP  OT Goal Formulation: With patient Time For Goal Achievement: 03/24/20 Potential to Achieve Goals: Good ADL Goals Pt Will Perform Grooming: with min guard assist;standing Pt Will Perform Upper Body Bathing: with set-up;with supervision;sitting Pt Will Perform Lower Body Bathing: with min assist;sit to/from stand Pt Will Perform Upper Body Dressing: with supervision;with set-up;sitting Pt Will Perform Lower Body Dressing: with min assist;sit to/from stand Pt Will Transfer to Toilet: with min assist;stand pivot transfer;bedside commode Pt Will Perform Toileting - Clothing Manipulation and hygiene: with min assist;sit to/from stand Additional ADL Goal #1: Pt will demonstrate emergent awareness of deficits with min cues  OT Frequency: Min 2X/week   Barriers to D/C: Decreased caregiver support  lives alone        Co-evaluation              AM-PAC OT "6 Clicks" Daily Activity     Outcome Measure Help  from another person eating meals?: A Little Help from another person taking care of personal grooming?: A Little Help  from another person toileting, which includes using toliet, bedpan, or urinal?: A Lot Help from another person bathing (including washing, rinsing, drying)?: A Lot Help from another person to put on and taking off regular upper body clothing?: A Lot Help from another person to put on and taking off regular lower body clothing?: A Lot 6 Click Score: 14   End of Session Equipment Utilized During Treatment: Gait belt Nurse Communication: Mobility status  Activity Tolerance: Patient tolerated treatment well Patient left: in chair;with call bell/phone within reach;with chair alarm set  OT Visit Diagnosis: Unsteadiness on feet (R26.81);Cognitive communication deficit (R41.841) Symptoms and signs involving cognitive functions: Cerebral infarction                Time: 6606-0045 OT Time Calculation (min): 28 min Charges:  OT General Charges $OT Visit: 1 Visit OT Evaluation $OT Eval Moderate Complexity: 1 Mod OT Treatments $Self Care/Home Management : 8-22 mins  Nilsa Nutting., OTR/L Acute Rehabilitation Services Pager [phone] Office [place], [place] 03/10/2020, 3:15 PM

## 2020-03-10 NOTE — Progress Notes (Signed)
Modified Barium Swallow Progress Note  Patient Details  Name: Blake [name] MRN: [id number] Date of Birth: [birth year]  Today's Date: 03/10/2020  Modified Barium Swallow completed.  Full report located under Chart Review in the Imaging Section.  Brief recommendations include the following:  Clinical Impression  Pt's swallow function appears to be grossly similar to previous MBS, with what apepars to be a primary esophageal dysphagia. He has a prolonged oral phase with decreased bolus cohesion, losing some of the boluses sublingually with mild residue remaining, but clearing this well with a spontaneous second swallow. He had a single instance of premature spillage when drinking thin liquids via straw, which appeared to penetrate to the vocal folds but fully clear (visibility below the vocal folds was reduced by pt positioning in that moment, but no barium was noted in his trachea or laryngeal vestibule after). Otherwise he would have intermittent, trace penetration that would clear as he continued to swallow. Towards the end of testing pt did exhibit coughing as he has been demonstrating upstairs, but this was a result of backflow from the esophagus entering into his pharynx/pyriform sinuses. There was no backflow into the larynx on this study, but his esophagus appeared to be filled with barium. Recommend Dys 2 diet and thin liquids in smaller, more frequent meals, using aspiration and esophageal precautions. Could consider further esophageal w/u as his risk for aspiration appears to be greatest post-prandially.     Swallow Evaluation Recommendations   Recommended Consults: Consider esophageal assessment   SLP Diet Recommendations: Dysphagia 2 (Fine chop) solids;Thin liquid   Liquid Administration via: Cup;Straw   Medication Administration: Crushed with puree   Supervision: Patient able to self feed;Intermittent supervision to cue for compensatory strategies   Compensations: Minimize  environmental distractions;Slow rate;Small sips/bites;Follow solids with liquid   Postural Changes: Remain semi-upright after after feeds/meals (Comment);Seated upright at 90 degrees   Oral Care Recommendations: Oral care BID        Osie Bond., M.A. [place] Pager [phone] Office [phone]  03/10/2020,12:33 PM

## 2020-03-10 NOTE — Progress Notes (Signed)
Pt attempt to get up from chair x2. Reiterated to patient to use call bell and wait until staff come to assist. Chair alarm on, socks on, yellow arm band on, floor mats in place. Will continue to monitor.

## 2020-03-11 DIAGNOSIS — F1010 Alcohol abuse, uncomplicated: Secondary | ICD-10-CM

## 2020-03-11 DIAGNOSIS — R2990 Unspecified symptoms and signs involving the nervous system: Secondary | ICD-10-CM

## 2020-03-11 DIAGNOSIS — R410 Disorientation, unspecified: Secondary | ICD-10-CM

## 2020-03-11 DIAGNOSIS — E1159 Type 2 diabetes mellitus with other circulatory complications: Secondary | ICD-10-CM

## 2020-03-11 LAB — CBC
HCT: 35.5 % — ABNORMAL LOW (ref 39.0–52.0)
Hemoglobin: 11.7 g/dL — ABNORMAL LOW (ref 13.0–17.0)
MCH: 31 pg (ref 26.0–34.0)
MCHC: 33 g/dL (ref 30.0–36.0)
MCV: 94.2 fL (ref 80.0–100.0)
Platelets: 221 10*3/uL (ref 150–400)
RBC: 3.77 MIL/uL — ABNORMAL LOW (ref 4.22–5.81)
RDW: 14.9 % (ref 11.5–15.5)
WBC: 10.9 10*3/uL — ABNORMAL HIGH (ref 4.0–10.5)
nRBC: 0 % (ref 0.0–0.2)

## 2020-03-11 LAB — BASIC METABOLIC PANEL
Anion gap: 8 (ref 5–15)
BUN: 18 mg/dL (ref 8–23)
CO2: 21 mmol/L — ABNORMAL LOW (ref 22–32)
Calcium: 8.4 mg/dL — ABNORMAL LOW (ref 8.9–10.3)
Chloride: 110 mmol/L (ref 98–111)
Creatinine, Ser: 1.55 mg/dL — ABNORMAL HIGH (ref 0.61–1.24)
GFR calc Af Amer: 49 mL/min — ABNORMAL LOW (ref >60)
GFR calc non Af Amer: 42 mL/min — ABNORMAL LOW (ref >60)
Glucose, Bld: 96 mg/dL (ref 70–99)
Potassium: 3.6 mmol/L (ref 3.5–5.1)
Sodium: 139 mmol/L (ref 135–145)

## 2020-03-11 NOTE — Progress Notes (Signed)
Called and spoke with Blake [name], pt's POA about transfer to 3W. Personal belongings going to new room with patient include phone, Games developer. Upper and lower dentures in mouth.

## 2020-03-11 NOTE — Progress Notes (Signed)
  Speech Language Pathology Treatment: Dysphagia  Patient Details Name: Blake [name] MRN: [id number] DOB: 10-Apr-1941 Today's Date: 03/11/2020 Time: 0822-0832 SLP Time Calculation (min) (ACUTE ONLY): 10 min  Assessment / Plan / Recommendation Clinical Impression  Pt has no recollection of completing MBS on previous date. Results/recommendations were reviewed with him as he began his breakfast meal. Pt has intermittent coughing, which during MBS was associated with backflow from the esophagus into the pharynx. Today I question if it could also be related to premature spillage as pt is frequently trying to talk with substantial amounts of food in his mouth. When he is not engaged in conversation, there is no further coughing noted with Mod cues able to be faded out. Recommend set-up assist with intermittent supervision so as to reduce environmental distractions.    HPI HPI: Pt is a 79 yo male presenting with acute onset of L weakness, L hemianopia, and R gaze preference. CT Head negative; MRI pending. MBS 07/15/18 primary esophageal dysphagia, backflow from esophagus into pharynx, residue throughout esophagus. Flash penetration of thin. MBS 01/10/19 with mild oral dysphagia and persistent esophageal dysphagia. PMH includes: CVA, HTN, HLD, GERD, ETOH abuse, COPD, smoker, HH, esophageal dilation 2019, tortuous esophagus      SLP Plan  Continue with current plan of care       Recommendations  Diet recommendations: Dysphagia 2 (fine chop);Thin liquid Liquids provided via: Cup;No straw Medication Administration: Crushed with puree Supervision: Patient able to self feed;Intermittent supervision to cue for compensatory strategies Compensations: Minimize environmental distractions;Slow rate;Small sips/bites;Follow solids with liquid Postural Changes and/or Swallow Maneuvers: Seated upright 90 degrees;Upright 30-60 min after meal                Oral Care Recommendations: Oral care BID Follow up  Recommendations: 24 hour supervision/assistance SLP Visit Diagnosis: Dysphagia, pharyngoesophageal phase (R13.14) Plan: Continue with current plan of care       GO                Osie Bond., M.A. [place] Acute Rehabilitation Services Pager [phone] Office [phone]  03/11/2020, 8:34 AM

## 2020-03-11 NOTE — Progress Notes (Signed)
Physical Therapy Treatment Patient Details Name: Blake [name] MRN: [id number] DOB: [birth year] Today's Date: 03/11/2020    History of Present Illness 79 yo male presenting with acute onset of L weakness, L hemianopia, and R gaze preference. CT Head negative; MRI negative. Pt received TPA in ED. PMH includes: CVA, HTN, HLD, GERD, ETOH abuse, COPD, smoker.    PT Comments    Pt was able to walk a [place] distance down the hallway with RW and chair to follow for safety.  He remains min to mod assist for mobility and does not normally use a device to walk at baseline.  He continues to have cognitive deficits.  He remains appropriate for SNF level rehab at discharge.  PT will continue to follow acutely for safe mobility progression.   Follow Up Recommendations  SNF     Equipment Recommendations  None recommended by PT    Recommendations for Other Services   NA     Precautions / Restrictions Precautions Precautions: Fall Precaution Comments: h/o falls    Mobility  Bed Mobility Overal bed mobility: Needs Assistance Bed Mobility: Supine to Sit     Supine to sit: HOB elevated;Supervision     General bed mobility comments: Extra time needed to come up to sitting EOB, HOB elevated and pt using rail heavily  Transfers Overall transfer level: Needs assistance Equipment used: Rolling walker (2 wheeled) Transfers: Sit to/from Stand Sit to Stand: Min assist;Mod assist         General transfer comment: Up to mod assist to stand from lower chair without armrests vs higher bed.  Uncontrolled "plop" descent with cues to take RW all the way back to his sitting surface (wants to let go of the walker too early, not safe  Ambulation/Gait Ambulation/Gait assistance: Min assist;+2 safety/equipment Gait Distance (Feet): 40 Feet Assistive device: Rolling walker (2 wheeled) Gait Pattern/deviations: Step-through pattern;Shuffle;Trunk flexed Gait velocity: decreased Gait velocity interpretation:  1.31 - 2.62 ft/sec, indicative of limited community ambulator General Gait Details: Pt needed min assist to ambulate with RW for balance.  Cues to keep feet inside of walker.        Modified Rankin (Stroke Patients Only) Modified Rankin (Stroke Patients Only) Pre-Morbid Rankin Score: Slight disability Modified Rankin: Moderately severe disability     Balance Overall balance assessment: Needs assistance Sitting-balance support: Feet supported;Bilateral upper extremity supported Sitting balance-Leahy Scale: Fair Sitting balance - Comments: pt able to lift hands momentarily off of the bed in sitting.  Less lateral leaning today compared to yesterday. Close supervision for safety   Standing balance support: Bilateral upper extremity supported;During functional activity Standing balance-Leahy Scale: Poor Standing balance comment: needs external support from therapist and assistive device in standing.                             Cognition Arousal/Alertness: Awake/alert Behavior During Therapy: Impulsive;Agitated (easily agitated) Overall Cognitive Status: Impaired/Different from baseline Area of Impairment: Safety/judgement;Awareness                     Memory: Decreased Haugen-term memory Following Commands: Follows one step commands consistently Safety/Judgement: Decreased awareness of deficits;Decreased awareness of safety Awareness: Intellectual   General Comments: Pt also HOH, so comprehension is poor         General Comments General comments (skin integrity, edema, etc.): Pt unable to recall therapist from yesterday, unable to recall how to use the call bell to call his  RN (I reviewed this with him yesterday as well).        Pertinent Vitals/Pain Pain Assessment: Faces Faces Pain Scale: Hurts little more Pain Location: R hip Pain Descriptors / Indicators: Grimacing;Guarding Pain Intervention(s): Limited activity within patient's tolerance;Monitored  during session;Repositioned           PT Goals (current goals can now be found in the care plan section) Acute Rehab PT Goals Patient Stated Goal: to go home ASAP  Progress towards PT goals: Progressing toward goals    Frequency    Min 3X/week      PT Plan Current plan remains appropriate       AM-PAC PT "6 Clicks" Mobility   Outcome Measure  Help needed turning from your back to your side while in a flat bed without using bedrails?: A Little Help needed moving from lying on your back to sitting on the side of a flat bed without using bedrails?: A Little Help needed moving to and from a bed to a chair (including a wheelchair)?: A Little Help needed standing up from a chair using your arms (e.g., wheelchair or bedside chair)?: A Lot Help needed to walk in hospital room?: A Little Help needed climbing 3-5 steps with a railing? : A Lot 6 Click Score: 16    End of Session Equipment Utilized During Treatment: Gait belt Activity Tolerance: Patient limited by fatigue Patient left: in chair;with call bell/phone within reach;with chair alarm set Nurse Communication: Mobility status PT Visit Diagnosis: Unsteadiness on feet (R26.81);Difficulty in walking, not elsewhere classified (R26.2);Muscle weakness (generalized) (M62.81)     Time: 2620-3559 PT Time Calculation (min) (ACUTE ONLY): 19 min  Charges:  $Gait Training: 8-22 mins          Verdene Lennert, PT, DPT  Acute Rehabilitation [phone] pager #[phone] office              03/11/2020, 2:22 PM

## 2020-03-11 NOTE — TOC Progression Note (Addendum)
Transition of Care [facility]) - Progression Note    Patient Details  Name: Blake [name] MRN: [id number] Date of Birth: 19-Jan-1941  [facility]) CM/SW [place], [place] Phone Number: 03/11/2020, 3:56 PM  Clinical Narrative:     CSW met with pt for SNF consult. Pt is agreeable to SNF recommendation and is okay with CSW faxing out bed requests to [place] facilities. Pt has no other preferences. States he has been to a SNF before but does not remember where. Pt consents to CSW contacted his friend Rayder Sullenger if needed.   Fl2 completed and bed requests faxed in hub  Attempted to call pt friend Glennie Rodda to update on SNF process. Friend is okay with bed requests to be sent to [place] facilities as well as [facility] in [place].   Expected Discharge Plan: [place] Barriers to Discharge: Continued Medical Work up  Expected Discharge Plan and Services Expected Discharge Plan: [place] Choice: [place] arrangements for the past 2 months: Single Family Home                   DME Agency: NA                   Social Determinants of Health (SDOH) Interventions    Readmission Risk Interventions No flowsheet data found.

## 2020-03-11 NOTE — Consult Note (Signed)
Physical Medicine and Rehabilitation Consult   Reason for Consult: Functional deficits.  Referring Physician: Dr. Erlinda Hong   HPI: Blake [name] is a 79 y.o. male with history of HTN, COPD, ETOH abuse, TIA, chronic A. fib--on no meds; who was admitted on 03/06/2020 after fall  with left-sided weakness.  Patient with left hemianopsia with right gaze preference and CT head showed negative and CTA/perfusion was negative for LVO and showed severe bilateral PCA P2 stenosis with chronic occlusion/near occlusion on the left.  He received IV TPA due to concerns of L-MCA infarct with broken embolus. EEG done for work up and was negative for seizure activity.  MRI brain negative for acute abnormality and showed progressinge white matter changes and mild to moderate lateral and 3rd ventriculomegaly.  Cardiology consulted for input on bradycardia with heart rates down to 30s when asleep and recommended avoiding antiarrhythmics and pacing needed as second-degree heart block felt secondary to neurological event.  Patient with history of seizures in the past and was started on Keppra XR as well as DOAC due to A fib. Dr. Erlinda Hong felt that patient with TIA v/s aborted stroke v/s seizures accounting for neurological deficits in patient with dementia and ETOH abuse.  Patient with limitations due to left sided weakness with right gaze preference, left hemianopsia, balance deficits with right lateral lean, dysphagia and chronic cognitive deficits affecting ADLs and mobility. CIR recommended due to functional decline.    Review of Systems  Constitutional: Negative for chills and fever.  HENT: Negative for hearing loss and tinnitus.   Eyes: Negative for blurred vision and double vision.  Respiratory: Positive for cough. Negative for hemoptysis.   Cardiovascular: Negative for chest pain and palpitations.  Gastrointestinal: Negative for heartburn and nausea.  Genitourinary: Negative for dysuria and urgency.  Musculoskeletal:  Negative for myalgias and neck pain.  Skin: Negative for itching and rash.  Neurological: Negative for dizziness and headaches.  Endo/Heme/Allergies: Negative for environmental allergies. Does not bruise/bleed easily.  Psychiatric/Behavioral: Positive for memory loss. The patient does not have insomnia.       Past Medical History:  Diagnosis Date  . Chronic insomnia   . Cigarette smoker   . COPD (chronic obstructive pulmonary disease) ([place])   . Diverticulosis of colon   . Elevated prostate specific antigen (PSA)   . ETOH abuse   . GERD (gastroesophageal reflux disease)   . Hx of colonic polyps   . Hyperlipidemia   . Hypertension   . Pulmonary nodule   . Shortness of breath   . Stroke [facility])     Past Surgical History:  Procedure Laterality Date  . COLONOSCOPY    . ESOPHAGOGASTRODUODENOSCOPY (EGD) WITH PROPOFOL N/A 07/21/2018   Procedure: ESOPHAGOGASTRODUODENOSCOPY (EGD) WITH PROPOFOL;  Surgeon: Lavena Bullion, DO;  Location: [facility];  Service: Gastroenterology;  Laterality: N/A;  . needle biopsy RUL nodule  01/2009   benign  . TONSILLECTOMY  1947  . VASECTOMY      Family History  Problem Relation Age of Onset  . Diabetes Father   . Lung cancer Brother   . Colon cancer Neg Hx   . Esophageal cancer Neg Hx   . Stomach cancer Neg Hx     Social History:  Lives alone and independent PTA. reports that he has been smoking cigarettes. He has a 55.00 pack-year smoking history. He has never used smokeless tobacco. He reports current alcohol use of about 24.0 standard drinks of alcohol per week. He  reports that he does not use drugs.    Allergies: No Known Allergies    Medications Prior to Admission  Medication Sig Dispense Refill  . aspirin 325 MG tablet Take 1 tablet (325 mg total) by mouth daily with breakfast. (Patient not taking: Reported on 03/07/2020) 30 tablet 2  . atorvastatin (LIPITOR) 20 MG tablet Take 1 tablet (20 mg total) by mouth daily. (Patient not  taking: Reported on 03/07/2020) 30 tablet 11  . Cholecalciferol (VITAMIN D-3) 25 MCG (1000 UT) CAPS Take 1 capsule (1,000 Units total) by mouth daily. (Patient not taking: Reported on 03/07/2020) 30 capsule 0  . folic acid (FOLVITE) 1 MG tablet Take 1 tablet (1 mg total) by mouth daily. (Patient not taking: Reported on 03/07/2020) 30 tablet 2  . furosemide (LASIX) 20 MG tablet Take 20 mg by mouth daily. (Patient not taking: Reported on 03/07/2020)    . gemfibrozil (LOPID) 600 MG tablet Take 1 tablet (600 mg total) by mouth 2 (two) times daily. (Patient not taking: Reported on 03/07/2020) 60 tablet 0  . ipratropium-albuterol (DUONEB) 0.5-2.5 (3) MG/3ML SOLN Take 3 mLs by nebulization See admin instructions. Nebulize and inhale 3 ml's into the lungs at 6 AM, 2 PM, and 10 PM for 5 days with a start date of 07/06/18 AND 3 ml's every six hours as needed for coughing (Patient not taking: Reported on 03/07/2020) 3 mL 0  . levETIRAcetam (KEPPRA) 500 MG tablet Take 1 tablet (500 mg total) by mouth 2 (two) times daily. (Patient not taking: Reported on 03/07/2020) 60 tablet 0  . Multiple Vitamin (MULTIVITAMIN WITH MINERALS) TABS tablet Take 1 tablet by mouth daily. (Patient not taking: Reported on 03/07/2020) 30 tablet 2  . potassium chloride (KLOR-CON) 10 MEQ tablet Take 10 mEq by mouth daily. (Patient not taking: Reported on 03/07/2020)    . QUEtiapine (SEROQUEL) 25 MG tablet Take 1 tablet (25 mg total) by mouth at bedtime. (Patient not taking: Reported on 03/07/2020) 30 tablet 1  . thiamine 100 MG tablet Take 1 tablet (100 mg total) by mouth daily. (Patient not taking: Reported on 03/07/2020) 30 tablet 0    Home: Home Living Family/patient expects to be discharged to:: Private residence Living Arrangements: Alone Available Help at Discharge: Family, Friend(s), Available PRN/intermittently Type of Home: Mobile home Home Access: Level entry Home Layout: One level Bathroom Shower/Tub: Multimedia programmer:  Standard Home Equipment: None Additional Comments: Pt reports he lives alone and his friend "Raffaele" assists him PRN and provides transportation   Functional History: Prior Function Level of Independence: Independent Comments: Pt reports he does not drive  Functional Status:  Mobility: Bed Mobility Overal bed mobility: Needs Assistance Bed Mobility: Supine to Sit Supine to sit: HOB elevated, Supervision General bed mobility comments: Pt sitting up in chair  Transfers Overall transfer level: Needs assistance Equipment used: Rolling walker (2 wheeled), 1 person hand held assist Transfers: Sit to/from Stand Sit to Stand: Mod assist Stand pivot transfers: Mod assist, Min assist General transfer comment: Mod assist to stand and pivot to the recliner chair from the bed without AD min assist when RW used.   Ambulation/Gait Ambulation/Gait assistance: Mod assist Gait Distance (Feet): 3 Feet Assistive device: Rolling walker (2 wheeled) Gait Pattern/deviations: Step-through pattern, Shuffle, Trunk flexed General Gait Details: Attempted to have pt walk to the end of the bed with RW and he was able to take 1-2 Kam choppy steps, but with mod assist to control him for balance and control the  RW from sliding too far away from him.  He was unable to do much more and I attempted x 2.  He would need two person assist to safely try to walk across the room with an AD and likely chair to follow. He would be unable to attempt without an AD.     ADL: ADL Overall ADL's : Needs assistance/impaired Eating/Feeding: Set up, Supervision/ safety, Sitting Eating/Feeding Details (indicate cue type and reason): heavy Rt lateral lean  Grooming: Wash/dry hands, Wash/dry face, Brushing hair, Set up, Supervision/safety, Sitting Upper Body Bathing: Supervision/ safety, Set up, Sitting Lower Body Bathing: Moderate assistance, Sit to/from stand Lower Body Bathing Details (indicate cue type and reason): Pt struggles to  access feet and demonstrates increased DOE.  Assist to stand to bathe peri area  Upper Body Dressing : Minimal assistance, Sitting Upper Body Dressing Details (indicate cue type and reason): heavy Rt lateral lean  Lower Body Dressing: Maximal assistance, Sit to/from stand Lower Body Dressing Details (indicate cue type and reason): Pt is able to don Lt sock after several attempts and multiple rest breaks.  DOE 3/4.  He was unable to don Rt sock and becomes irritable when things are challenging  Toilet Transfer: Moderate assistance, Stand-pivot, BSC, RW Toileting- Clothing Manipulation and Hygiene: Maximal assistance, Sit to/from stand Functional mobility during ADLs: Moderate assistance, Rolling walker  Cognition: Cognition Overall Cognitive Status: Impaired/Different from baseline Orientation Level: Oriented X4 Cognition Arousal/Alertness: Awake/alert Behavior During Therapy: Impulsive Overall Cognitive Status: Impaired/Different from baseline Area of Impairment: Attention, Memory, Following commands, Safety/judgement, Awareness, Problem solving Current Attention Level: Selective Memory: Decreased [place]-term memory Following Commands: Follows one step commands consistently, Follows multi-step commands with increased time, Follows multi-step commands inconsistently Safety/Judgement: Decreased awareness of deficits, Decreased awareness of safety Awareness: Intellectual (but questions if he had a stroke ) Problem Solving: Slow processing, Difficulty sequencing, Requires verbal cues, Requires tactile cues General Comments: Pt demonstrates very poor awareness of current deficits and is unable to recognize deficits even as they are occuring   Blood pressure (!) 167/76, pulse (!) 54, temperature 98.3 F (36.8 C), temperature source Oral, resp. rate 19, height 5\' 10"  (1.778 m), weight 64.3 kg, SpO2 100 %. Physical Exam  General: Alert and oriented x 3, No apparent distress HEENT: Head is  normocephalic, atraumatic, sclera anicteric, oral mucosa pink and moist, dentition intact, ext ear canals clear, left sided hemianopia, right sided gaze preference Neck: Supple without JVD or lymphadenopathy Heart: Reg rate and rhythm. No murmurs rubs or gallops Chest: CTA bilaterally without wheezes, rales, or rhonchi; no distress. +cough Abdomen: Soft, non-tender, non-distended, bowel sounds positive. Extremities: No clubbing, cyanosis, or edema. Pulses are 2+ Skin: Clean and intact without signs of breakdown Neuro: Pt is cognitively appropriate with normal insight and awareness. +impaired memory. Cranial nerves 2-12 are intact. Sensory exam is normal. Reflexes are 2+ in all 4's. Fine motor coordination is intact. No tremors. Motor function is grossly 5/5 on right side and 4/5 on left side Musculoskeletal: Full ROM, No pain with AROM or PROM in the neck, trunk, or extremities. Posture appropriate Psych: Pt's affect is appropriate. Pt is cooperative  Results for orders placed or performed during the hospital encounter of 03/06/20 (from the past 24 hour(s))  CBC     Status: Abnormal   Collection Time: 03/11/20  7:23 AM  Result Value Ref Range   WBC 10.9 (H) 4.0 - 10.5 K/uL   RBC 3.77 (L) 4.22 - 5.81 MIL/uL   Hemoglobin 11.7 (  L) 13.0 - 17.0 g/dL   HCT 35.5 (L) 39 - 52 %   MCV 94.2 80.0 - 100.0 fL   MCH 31.0 26.0 - 34.0 pg   MCHC 33.0 30.0 - 36.0 g/dL   RDW 14.9 11.5 - 15.5 %   Platelets 221 150 - 400 K/uL   nRBC 0.0 0.0 - 0.2 %  Basic metabolic panel     Status: Abnormal   Collection Time: 03/11/20  7:23 AM  Result Value Ref Range   Sodium 139 135 - 145 mmol/L   Potassium 3.6 3.5 - 5.1 mmol/L   Chloride 110 98 - 111 mmol/L   CO2 21 (L) 22 - 32 mmol/L   Glucose, Bld 96 70 - 99 mg/dL   BUN 18 8 - 23 mg/dL   Creatinine, Ser 1.55 (H) 0.61 - 1.24 mg/dL   Calcium 8.4 (L) 8.9 - 10.3 mg/dL   GFR calc non Af Amer 42 (L) >60 mL/min   GFR calc Af Amer 49 (L) >60 mL/min   Anion gap 8 5 - 15     DG Swallowing Func-Speech Pathology  Result Date: 03/10/2020 Objective Swallowing Evaluation: Type of Study: MBS-Modified Barium Swallow Study  Patient Details Name: PENG THORSTENSON MRN: [id number] Date of Birth: 02-Sep-1941 Today's Date: 03/10/2020 Time: SLP Start Time (ACUTE ONLY): 1114 -SLP Stop Time (ACUTE ONLY): 1134 SLP Time Calculation (min) (ACUTE ONLY): 20 min Past Medical History: Past Medical History: Diagnosis Date . Chronic insomnia  . Cigarette smoker  . COPD (chronic obstructive pulmonary disease) ([place])  . Diverticulosis of colon  . Elevated prostate specific antigen (PSA)  . ETOH abuse  . GERD (gastroesophageal reflux disease)  . Hx of colonic polyps  . Hyperlipidemia  . Hypertension  . Pulmonary nodule  . Shortness of breath  . Stroke [facility])  Past Surgical History: Past Surgical History: Procedure Laterality Date . COLONOSCOPY   . ESOPHAGOGASTRODUODENOSCOPY (EGD) WITH PROPOFOL N/A 07/21/2018  Procedure: ESOPHAGOGASTRODUODENOSCOPY (EGD) WITH PROPOFOL;  Surgeon: Lavena Bullion, DO;  Location: [facility];  Service: Gastroenterology;  Laterality: N/A; . needle biopsy RUL nodule  01/2009  benign . TONSILLECTOMY  1947 . VASECTOMY   HPI: Pt is a 79 yo male presenting with acute onset of L weakness, L hemianopia, and R gaze preference. CT Head negative; MRI pending. MBS 07/15/18 primary esophageal dysphagia, backflow from esophagus into pharynx, residue throughout esophagus. Flash penetration of thin. MBS 01/10/19 with mild oral dysphagia and persistent esophageal dysphagia. PMH includes: CVA, HTN, HLD, GERD, ETOH abuse, COPD, smoker, HH, esophageal dilation 2019, tortuous esophagus  Subjective: alert, cooperative, HOH Assessment / Plan / Recommendation CHL IP CLINICAL IMPRESSIONS 03/10/2020 Clinical Impression Pt's swallow function appears to be grossly similar to previous MBS, with what apepars to be a primary esophageal dysphagia. He has a prolonged oral phase with decreased bolus cohesion, losing  some of the boluses sublingually with mild residue remaining, but clearing this well with a spontaneous second swallow. He had a single instance of premature spillage when drinking thin liquids via straw, which appeared to penetrate to the vocal folds but fully clear (visibility below the vocal folds was reduced by pt positioning in that moment, but no barium was noted in his trachea or laryngeal vestibule after). Otherwise he would have intermittent, trace penetration that would clear as he continued to swallow. Towards the end of testing pt did exhibit coughing as he has been demonstrating upstairs, but this was a result of backflow from the esophagus entering into his  pharynx/pyriform sinuses. There was no backflow into the larynx on this study, but his esophagus appeared to be filled with barium. Recommend Dys 2 diet and thin liquids in smaller, more frequent meals, using aspiration and esophageal precautions. Could consider further esophageal w/u as his risk for aspiration appears to be greatest post-prandially.   SLP Visit Diagnosis Dysphagia, pharyngoesophageal phase (R13.14) Attention and concentration deficit following -- Frontal lobe and executive function deficit following -- Impact on safety and function Moderate aspiration risk   CHL IP TREATMENT RECOMMENDATION 03/10/2020 Treatment Recommendations Therapy as outlined in treatment plan below   Prognosis 03/10/2020 Prognosis for Safe Diet Advancement Fair Barriers to Reach Goals Time post onset;Other (Comment) Barriers/Prognosis Comment -- CHL IP DIET RECOMMENDATION 03/10/2020 SLP Diet Recommendations Dysphagia 2 (Fine chop) solids;Thin liquid Liquid Administration via Cup;Straw Medication Administration Crushed with puree Compensations Minimize environmental distractions;Slow rate;Small sips/bites;Follow solids with liquid Postural Changes Remain semi-upright after after feeds/meals (Comment);Seated upright at 90 degrees   CHL IP OTHER RECOMMENDATIONS  03/10/2020 Recommended Consults Consider esophageal assessment Oral Care Recommendations Oral care BID Other Recommendations --   CHL IP FOLLOW UP RECOMMENDATIONS 03/10/2020 Follow up Recommendations 24 hour supervision/assistance   CHL IP FREQUENCY AND DURATION 03/10/2020 Speech Therapy Frequency (ACUTE ONLY) min 1 x/week Treatment Duration 2 weeks      CHL IP ORAL PHASE 03/10/2020 Oral Phase Impaired Oral - Pudding Teaspoon -- Oral - Pudding Cup -- Oral - Honey Teaspoon -- Oral - Honey Cup -- Oral - Nectar Teaspoon -- Oral - Nectar Cup -- Oral - Nectar Straw -- Oral - Thin Teaspoon -- Oral - Thin Cup Decreased bolus cohesion;Lingual/palatal residue;Holding of bolus Oral - Thin Straw Decreased bolus cohesion;Lingual/palatal residue;Premature spillage;Holding of bolus Oral - Puree Decreased bolus cohesion;Delayed oral transit;Holding of bolus Oral - Mech Soft Delayed oral transit;Decreased bolus cohesion;Impaired mastication Oral - Regular -- Oral - Multi-Consistency -- Oral - Pill -- Oral Phase - Comment --  CHL IP PHARYNGEAL PHASE 03/10/2020 Pharyngeal Phase Impaired Pharyngeal- Pudding Teaspoon -- Pharyngeal -- Pharyngeal- Pudding Cup -- Pharyngeal -- Pharyngeal- Honey Teaspoon -- Pharyngeal -- Pharyngeal- Honey Cup -- Pharyngeal -- Pharyngeal- Nectar Teaspoon -- Pharyngeal -- Pharyngeal- Nectar Cup -- Pharyngeal -- Pharyngeal- Nectar Straw -- Pharyngeal -- Pharyngeal- Thin Teaspoon -- Pharyngeal -- Pharyngeal- Thin Cup Penetration/Aspiration during swallow Pharyngeal Material enters airway, remains ABOVE vocal cords then ejected out Pharyngeal- Thin Straw Penetration/Aspiration during swallow Pharyngeal Material enters airway, remains ABOVE vocal cords and not ejected out;Material enters airway, remains ABOVE vocal cords then ejected out Pharyngeal- Puree WFL Pharyngeal -- Pharyngeal- Mechanical Soft WFL Pharyngeal -- Pharyngeal- Regular -- Pharyngeal -- Pharyngeal- Multi-consistency -- Pharyngeal -- Pharyngeal- Pill  -- Pharyngeal -- Pharyngeal Comment --  CHL IP CERVICAL ESOPHAGEAL PHASE 03/10/2020 Cervical Esophageal Phase Impaired Pudding Teaspoon -- Pudding Cup -- Honey Teaspoon -- Honey Cup -- Nectar Teaspoon -- Nectar Cup -- Nectar Straw -- Thin Teaspoon -- Thin Cup Esophageal backflow into the pharynx Thin Straw Esophageal backflow into the pharynx Puree Esophageal backflow into the pharynx Mechanical Soft Esophageal backflow into the pharynx Regular -- Multi-consistency -- Pill -- Cervical Esophageal Comment -- Osie Bond., M.A. [place] [facility] Pager [phone] Office [phone] 03/10/2020, 12:34 PM              Assessment/Plan: Diagnosis: TIA vs aborted stroke vs seizure 1. Does the need for close, 24 hr/day medical supervision in concert with the patient's rehab needs make it unreasonable for this patient to be served in a less intensive  setting? Yes 2. Co-Morbidities requiring supervision/potential complications: L weakness, L hemianopia, and R gaze preference, impaired memory, primary esophageal dysphagia, HTN, HLD, GERD, ETOH abuse, COPD, smoker, HH, esophageal dilation 2019, tortuous esophagus 3. Due to bladder management, bowel management, safety, skin/wound care, disease management, medication administration, pain management and patient education, does the patient require 24 hr/day rehab nursing? Yes 4. Does the patient require coordinated care of a physician, rehab nurse, therapy disciplines of PT, OT, SLP to address physical and functional deficits in the context of the above medical diagnosis(es)? Yes Addressing deficits in the following areas: balance, endurance, locomotion, strength, transferring, bowel/bladder control, bathing, dressing, feeding, grooming, toileting, cognition, language, swallowing and psychosocial support 5. Can the patient actively participate in an intensive therapy program of at least 3 hrs of therapy per day at least 5 days per week? Yes 6. The  potential for patient to make measurable gains while on inpatient rehab is excellent 7. Anticipated functional outcomes upon discharge from inpatient rehab are modified independent  with PT, modified independent with OT, modified independent with SLP. 8. Estimated rehab length of stay to reach the above functional goals is: 12-16 days 9. Anticipated discharge destination: Home 10. Overall Rehab/Functional Prognosis: excellent  RECOMMENDATIONS: This patient's condition is appropriate for continued rehabilitative care in the following setting: CIR Patient has agreed to participate in recommended program. Yes Note that insurance prior authorization may be required for reimbursement for recommended care.  Comment: Mr. Demore would be an excellent CIR candidate. He lives alone and has modI goals. He has PT, OT, and SLP needs. Thank you for this consult. We will continue to follow in Mr. Salmi's care.   Bary Leriche, PA-C 03/11/2020   I have personally performed a face to face diagnostic evaluation, including, but not limited to relevant history and physical exam findings, of this patient and developed relevant assessment and plan.  Additionally, I have reviewed and concur with the physician assistant's documentation above.  Leeroy Cha, MD

## 2020-03-11 NOTE — Progress Notes (Signed)
STROKE TEAM PROGRESS NOTE   INTERVAL HISTORY:  RN at bedside. Pt was initially sleeping on today's visit but easily arousable. Pt POA has discussed with SW and CIR, felt best place for pt is SNF. Pt has agreed with SNF at this time.   Vitals:   03/11/20 0600 03/11/20 0700 03/11/20 0800 03/11/20 1000  BP: (!) 168/87 (!) 183/91 (!) 163/71 (!) 167/76  Pulse: 66 64 66 (!) 54  Resp: 17 20 (!) 24 19  Temp:   98.3 F (36.8 C)   TempSrc:   Oral   SpO2: 98% 97% 97% 100%  Weight:      Height:       CBC:  Recent Labs  Lab 03/06/20 1648 03/06/20 1655 03/10/20 0931 03/11/20 0723  WBC 15.1*   < > 8.9 10.9*  NEUTROABS 13.1*  --   --   --   HGB 13.5   < > 11.9* 11.7*  HCT 42.0   < > 37.6* 35.5*  MCV 95.2   < > 96.2 94.2  PLT 233   < > 232 221   < > = values in this interval not displayed.   Basic Metabolic Panel:  Recent Labs  Lab 03/10/20 0931 03/11/20 0723  NA 137 139  K 3.6 3.6  CL 110 110  CO2 21* 21*  GLUCOSE 95 96  BUN 18 18  CREATININE 1.64* 1.55*  CALCIUM 8.2* 8.4*   Lipid Panel:     Component Value Date/Time   CHOL 147 03/07/2020 0609   TRIG 78 03/07/2020 0609   HDL 47 03/07/2020 0609   CHOLHDL 3.1 03/07/2020 0609   VLDL 16 03/07/2020 0609   LDLCALC 84 03/07/2020 0609   HgbA1c:  Lab Results  Component Value Date   HGBA1C 5.3 03/07/2020   Urine Drug Screen:     Component Value Date/Time   LABOPIA NONE DETECTED 03/09/2020 1437   COCAINSCRNUR NONE DETECTED 03/09/2020 1437   LABBENZ POSITIVE (A) 03/09/2020 1437   AMPHETMU NONE DETECTED 03/09/2020 1437   THCU NONE DETECTED 03/09/2020 1437   LABBARB NONE DETECTED 03/09/2020 1437    Alcohol Level     Component Value Date/Time   ETH <10 03/06/2020 1648    IMAGING DG Swallowing Func-Speech Pathology  Result Date: 03/10/2020 Objective Swallowing Evaluation: Type of Study: MBS-Modified Barium Swallow Study  Patient Details Name: Blake [name] MRN: [id number] Date of Birth: 1941/08/31 Today's Date: 03/10/2020  Time: SLP Start Time (ACUTE ONLY): 1114 -SLP Stop Time (ACUTE ONLY): 1134 SLP Time Calculation (min) (ACUTE ONLY): 20 min Past Medical History: Past Medical History: Diagnosis Date . Chronic insomnia  . Cigarette smoker  . COPD (chronic obstructive pulmonary disease) ([place])  . Diverticulosis of colon  . Elevated prostate specific antigen (PSA)  . ETOH abuse  . GERD (gastroesophageal reflux disease)  . Hx of colonic polyps  . Hyperlipidemia  . Hypertension  . Pulmonary nodule  . Shortness of breath  . Stroke [facility])  Past Surgical History: Past Surgical History: Procedure Laterality Date . COLONOSCOPY   . ESOPHAGOGASTRODUODENOSCOPY (EGD) WITH PROPOFOL N/A 07/21/2018  Procedure: ESOPHAGOGASTRODUODENOSCOPY (EGD) WITH PROPOFOL;  Surgeon: Lavena Bullion, DO;  Location: [place];  Service: Gastroenterology;  Laterality: N/A; . needle biopsy RUL nodule  01/2009  benign . TONSILLECTOMY  1947 . VASECTOMY   HPI: Pt is a 79 yo male presenting with acute onset of L weakness, L hemianopia, and R gaze preference. CT Head negative; MRI pending. MBS 07/15/18 primary esophageal dysphagia, backflow  from esophagus into pharynx, residue throughout esophagus. Flash penetration of thin. MBS 01/10/19 with mild oral dysphagia and persistent esophageal dysphagia. PMH includes: CVA, HTN, HLD, GERD, ETOH abuse, COPD, smoker, HH, esophageal dilation 2019, tortuous esophagus  Subjective: alert, cooperative, HOH Assessment / Plan / Recommendation CHL IP CLINICAL IMPRESSIONS 03/10/2020 Clinical Impression Pt's swallow function appears to be grossly similar to previous MBS, with what apepars to be a primary esophageal dysphagia. He has a prolonged oral phase with decreased bolus cohesion, losing some of the boluses sublingually with mild residue remaining, but clearing this well with a spontaneous second swallow. He had a single instance of premature spillage when drinking thin liquids via straw, which appeared to penetrate to the vocal folds  but fully clear (visibility below the vocal folds was reduced by pt positioning in that moment, but no barium was noted in his trachea or laryngeal vestibule after). Otherwise he would have intermittent, trace penetration that would clear as he continued to swallow. Towards the end of testing pt did exhibit coughing as he has been demonstrating upstairs, but this was a result of backflow from the esophagus entering into his pharynx/pyriform sinuses. There was no backflow into the larynx on this study, but his esophagus appeared to be filled with barium. Recommend Dys 2 diet and thin liquids in smaller, more frequent meals, using aspiration and esophageal precautions. Could consider further esophageal w/u as his risk for aspiration appears to be greatest post-prandially.   SLP Visit Diagnosis Dysphagia, pharyngoesophageal phase (R13.14) Attention and concentration deficit following -- Frontal lobe and executive function deficit following -- Impact on safety and function Moderate aspiration risk   CHL IP TREATMENT RECOMMENDATION 03/10/2020 Treatment Recommendations Therapy as outlined in treatment plan below   Prognosis 03/10/2020 Prognosis for Safe Diet Advancement Fair Barriers to Reach Goals Time post onset;Other (Comment) Barriers/Prognosis Comment -- CHL IP DIET RECOMMENDATION 03/10/2020 SLP Diet Recommendations Dysphagia 2 (Fine chop) solids;Thin liquid Liquid Administration via Cup;Straw Medication Administration Crushed with puree Compensations Minimize environmental distractions;Slow rate;Small sips/bites;Follow solids with liquid Postural Changes Remain semi-upright after after feeds/meals (Comment);Seated upright at 90 degrees   CHL IP OTHER RECOMMENDATIONS 03/10/2020 Recommended Consults Consider esophageal assessment Oral Care Recommendations Oral care BID Other Recommendations --   CHL IP FOLLOW UP RECOMMENDATIONS 03/10/2020 Follow up Recommendations 24 hour supervision/assistance   CHL IP FREQUENCY AND  DURATION 03/10/2020 Speech Therapy Frequency (ACUTE ONLY) min 1 x/week Treatment Duration 2 weeks      CHL IP ORAL PHASE 03/10/2020 Oral Phase Impaired Oral - Pudding Teaspoon -- Oral - Pudding Cup -- Oral - Honey Teaspoon -- Oral - Honey Cup -- Oral - Nectar Teaspoon -- Oral - Nectar Cup -- Oral - Nectar Straw -- Oral - Thin Teaspoon -- Oral - Thin Cup Decreased bolus cohesion;Lingual/palatal residue;Holding of bolus Oral - Thin Straw Decreased bolus cohesion;Lingual/palatal residue;Premature spillage;Holding of bolus Oral - Puree Decreased bolus cohesion;Delayed oral transit;Holding of bolus Oral - Mech Soft Delayed oral transit;Decreased bolus cohesion;Impaired mastication Oral - Regular -- Oral - Multi-Consistency -- Oral - Pill -- Oral Phase - Comment --  CHL IP PHARYNGEAL PHASE 03/10/2020 Pharyngeal Phase Impaired Pharyngeal- Pudding Teaspoon -- Pharyngeal -- Pharyngeal- Pudding Cup -- Pharyngeal -- Pharyngeal- Honey Teaspoon -- Pharyngeal -- Pharyngeal- Honey Cup -- Pharyngeal -- Pharyngeal- Nectar Teaspoon -- Pharyngeal -- Pharyngeal- Nectar Cup -- Pharyngeal -- Pharyngeal- Nectar Straw -- Pharyngeal -- Pharyngeal- Thin Teaspoon -- Pharyngeal -- Pharyngeal- Thin Cup Penetration/Aspiration during swallow Pharyngeal Material enters airway, remains ABOVE vocal cords  then ejected out Pharyngeal- Thin Straw Penetration/Aspiration during swallow Pharyngeal Material enters airway, remains ABOVE vocal cords and not ejected out;Material enters airway, remains ABOVE vocal cords then ejected out Pharyngeal- Puree WFL Pharyngeal -- Pharyngeal- Mechanical Soft WFL Pharyngeal -- Pharyngeal- Regular -- Pharyngeal -- Pharyngeal- Multi-consistency -- Pharyngeal -- Pharyngeal- Pill -- Pharyngeal -- Pharyngeal Comment --  CHL IP CERVICAL ESOPHAGEAL PHASE 03/10/2020 Cervical Esophageal Phase Impaired Pudding Teaspoon -- Pudding Cup -- Honey Teaspoon -- Honey Cup -- Nectar Teaspoon -- Nectar Cup -- Nectar Straw -- Thin Teaspoon --  Thin Cup Esophageal backflow into the pharynx Thin Straw Esophageal backflow into the pharynx Puree Esophageal backflow into the pharynx Mechanical Soft Esophageal backflow into the pharynx Regular -- Multi-consistency -- Pill -- Cervical Esophageal Comment -- Osie Bond., M.A. CCC-SLP Acute Rehabilitation Services Pager [phone] Office [phone] 03/10/2020, 12:34 PM                PHYSICAL EXAM  Temp:  [98.3 F (36.8 C)-98.7 F (37.1 C)] 98.3 F (36.8 C) (06/15 0800) Pulse Rate:  [48-78] 54 (06/15 1000) Resp:  [13-27] 19 (06/15 1000) BP: (140-191)/(60-162) 167/76 (06/15 1000) SpO2:  [97 %-100 %] 100 % (06/15 1000)  General - Well nourished, well developed, in no apparent distress.  Ophthalmologic - fundi not visualized due to noncooperation.  Cardiovascular - irregularly irregular heart rate and rhythm, with frequent bradycardia.  Mental Status -  Level of arousal and orientation to time, place, and person were intact. Language including expression, naming, repetition, comprehension was assessed and found intact. Mild dysarthria due to poor denture. Mild psychomotor slowing due to hard of hearing  Cranial Nerves II - XII - II - Visual field intact OU. III, IV, VI - Extraocular movements intact. V - Facial sensation intact bilaterally. VII - Facial movement intact bilaterally. VIII - hard of hearing & vestibular intact bilaterally. X - Palate elevates symmetrically. Poor denture, mild dysarthria XI - Chin turning & shoulder shrug intact bilaterally. XII - Tongue protrusion intact.  Motor Strength - The patient's strength was normal in all extremities and pronator drift was absent.  Bulk was normal and fasciculations were absent.   Motor Tone - Muscle tone was assessed at the neck and appendages and was normal.  Reflexes - The patient's reflexes were symmetrical in all extremities and he had no pathological reflexes.  Sensory - Light touch, temperature/pinprick were  assessed and were symmetrical.    Coordination - The patient had normal movements in the hands with no ataxia or dysmetria.  Tremor was absent.  Gait and Station - deferred.   ASSESSMENT/PLAN: [name] is a 79 y.o. male with history of AF not on [facility], currently noncompliant w/ all meds presenting 03/06/20 withL sided weakness.Received tPA 03/06/2020 at 1719.  TIA vs. Aborted stroke vs. Seizure  CT head - No acute abnormality. Small vessel disease progressed since 2020. ASPECTS 10.   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 ICA/VA plaque w/o stenosis   MRI 6/12 -No acute intracranial abnormality.   2D Echo - EF 60 - 65%. No cardiac source of emboli identified.   EEG - neg sz, lateralized R brain  SARS - negative  UDS-benzo  LDL84  HgbA1c5.3  eliquis for VTE prophylaxis   aspirin 325 mg dailyprior to admission, Now onEliquis for afib stroke prevention. Continue on discharge.  Therapy recommendations:CIR vs. SNF   Disposition: pending  (Lived alone PTA)  Awaiting  transfer to the floor  Hx of seizure  Sz hx - on Keppra(not taking)  EEG neg sz, lateralized R brain - ?post ictal  OnKeppraXR1,000 mg daily  Atrial Fibrillation Bradycardia  Cardiologyon board  mobitz I second degree AV block. No acute indication for pacing. Avoid AV nodal agents.  HR down to 33 during the night  HR improves during wakefulness  Not on [facility] PTA  Now on eliquis for stroke prevention  Hx stroke/TIA  12/2018 - Incidental R caudate infarct not related to presenting symptoms, secondary to small vessel disease   All previous 2020 MRIs neg for stroke  Medical non-compliance  Hypertension  Home meds: Lasix 20   Treated with cardene post tPA, now off  BPstable  Long-term BP goal normotensive  Hyperlipidemia  Home meds: lipitor 20 (not taking)  LDL 84, goal < 70  Resumelipitor  20  Continue statin at discharge  Dysphagia Protein calorie malnutrition, severe   MBSS - cleared for Dys 2 thin liquid diet  Speech on board  Ensure Enlive  Tobacco abuse  Current smoker  Smoking cessation counseling provided  Pt is willing to quit  Other Stroke Risk Factors  Advanced age  ETOH abuse, alcohol level <10, advised to drink no more than 2 drink(s) a day  Other Active Problems  Metabolic encephalopathy w/ confusion in an alcoholic male w/ hx seizures. Likely Baseline dementia from 2020 note. On seroquel  GERD  CKD - stage 3b - (GFR - 36) - CRE 1.75->1.7-1.77-1.64-1.55  leukocytosis - WBC 8.9->10.9  Hospital day # 5   [name], MD PhD Stroke Neurology 03/11/2020 11:11 AM  To contact Stroke Continuity provider, please refer to [email or web address] After hours, contact General Neurology

## 2020-03-11 NOTE — Progress Notes (Signed)
Pt transferred to 3W24. Telemetry verified. Skin tear noted on L hand. Belongings at bedside

## 2020-03-11 NOTE — NC FL2 (Signed)
[place] LEVEL OF CARE SCREENING TOOL     IDENTIFICATION  Patient Name: Blake [name] Birthdate: 08/26/1941 Sex: male Admission Date (Current Location): 03/06/2020  [facility] and [place] Number:  Herbalist and Address:         Provider Number: [id number]  Attending Physician Name and Address:  [name], MD  Relative Name and Phone Number:  Carvell, Hoeffner (Other) [phone] [facility] Phone)    Current Level of Care: SNF Recommended Level of Care: [place] Prior Approval Number:    Date Approved/Denied:   PASRR Number: [id number]  Discharge Plan: SNF    Current Diagnoses: Patient Active Problem List   Diagnosis Date Noted  . Stroke-like symptoms s/p tPA - ? TIA vs aborted stroke vs seizure 03/06/2020  . Acute CVA (cerebrovascular accident) ([place]) 01/10/2019  . Esophageal stricture   . Abnormal finding on GI tract imaging   . Dysphagia 07/20/2018  . Malnutrition of moderate degree 07/10/2018  . Aspiration pneumonia ([place]) 07/07/2018  . Seizure disorder ([place]) 07/07/2018  . HLD (hyperlipidemia) 07/07/2018  . Acute hypoxemic respiratory failure ([place]) 07/07/2018  . Encounter for intubation   . Acute respiratory failure with hypercapnia ([place])   . HCAP (healthcare-associated pneumonia)   . Protein-calorie malnutrition, severe 06/26/2018  . Alcohol withdrawal seizure ([place]) 06/25/2018  . New onset seizure ([place]) 01/07/2018  . Hypertension 01/07/2018  . GERD (gastroesophageal reflux disease) 01/07/2018  . Hyperlipidemia 01/07/2018  . Cigarette smoker 01/07/2018  . Alcohol abuse 01/07/2018  . Acute alcohol abuse, with delirium ([place])   . Acute encephalopathy 10/14/2014  . Unsteady gait 10/14/2014  . Alcohol withdrawal delirium ([place]) 10/14/2014  . Lipoma of neck 12/22/2012  . Cerebral atrophy ([place]) 09/28/2012  . Cerebrovascular small vessel disease 09/28/2012  . Left humeral fracture 07/04/2012  . Delirium 07/04/2012  . ETOH abuse    . ELEVATED PROSTATE SPECIFIC ANTIGEN 09/30/2010  . COPD (chronic obstructive pulmonary disease) ([place]) 01/28/2009  . PULMONARY NODULE 01/28/2009  . INSOMNIA, CHRONIC 10/15/2007  . COLONIC POLYPS 10/10/2007  . Type 2 diabetes mellitus ([place]) 10/10/2007  . Dyslipidemia 10/10/2007  . Essential hypertension 10/10/2007  . GERD 10/10/2007  . DIVERTICULOSIS OF COLON 10/10/2007    Orientation RESPIRATION BLADDER Height & Weight     Self, Time, Situation, Place  Normal Incontinent, External catheter Weight: 141 lb 12.1 oz (64.3 kg) Height:  5\' 10"  (177.8 cm)  BEHAVIORAL SYMPTOMS/MOOD NEUROLOGICAL BOWEL NUTRITION STATUS   (none)  (Seizure disorder (HCC) (Chronic), Stroke-like symptoms s/p tPA - ? TIA vs aborted stroke vs seizure) Continent Diet (see discharge summary)  AMBULATORY STATUS COMMUNICATION OF NEEDS Skin   Extensive Assist Verbally Normal                       Personal Care Assistance Level of Assistance  Bathing, Dressing, Feeding Bathing Assistance: Limited assistance Feeding assistance: Independent Dressing Assistance: Limited assistance     Functional Limitations Info  Sight, Hearing, Speech Sight Info: Adequate Hearing Info: Adequate Speech Info: Adequate    SPECIAL CARE FACTORS FREQUENCY  PT (By licensed PT), OT (By licensed OT)     PT Frequency: 5x/week OT Frequency: 5x/week            Contractures Contractures Info: Not present    Additional Factors Info  Code Status, Allergies Code Status Info: Full Allergies Info: NKA           Current Medications (03/11/2020):  This is the current hospital  active medication list Current Facility-Administered Medications  Medication Dose Route Frequency Provider Last Rate Last Admin  .  stroke: mapping our early stages of recovery book   Does not apply Once Greta Doom, MD      . acetaminophen (TYLENOL) tablet 650 mg  650 mg Oral Q4H PRN Greta Doom, MD       Or  . acetaminophen  (TYLENOL) 160 MG/5ML solution 650 mg  650 mg Per Tube Q4H PRN Greta Doom, MD       Or  . acetaminophen (TYLENOL) suppository 650 mg  650 mg Rectal Q4H PRN Greta Doom, MD      . apixaban Arne Cleveland) tablet 5 mg  5 mg Oral BID Allred, Jeneen Rinks, MD   5 mg at 03/11/20 1007  . atorvastatin (LIPITOR) tablet 20 mg  20 mg Oral Daily Greta Doom, MD   20 mg at 03/11/20 1007  . Chlorhexidine Gluconate Cloth 2 % PADS 6 each  6 each Topical Daily Greta Doom, MD   6 each at 03/11/20 1008  . feeding supplement (ENSURE ENLIVE) (ENSURE ENLIVE) liquid 237 mL  237 mL Oral BID BM [name], MD   237 mL at 03/11/20 1012  . food thickener (THICK IT) powder   Oral PRN [name], MD      . hydrALAZINE (APRESOLINE) injection 20 mg  20 mg Intravenous Q4H PRN [name], MD   20 mg at 03/10/20 2313  . levETIRAcetam (KEPPRA) 100 MG/ML solution 500 mg  500 mg Oral BID [name], MD   500 mg at 03/11/20 1007  . LORazepam (ATIVAN) injection 1-2 mg  1-2 mg Intravenous Q1H PRN Aroor, Lanice Schwab, MD   1 mg at 03/07/20 2116  . MEDLINE mouth rinse  15 mL Mouth Rinse BID Greta Doom, MD   15 mL at 03/11/20 1044  . pantoprazole (PROTONIX) EC tablet 40 mg  40 mg Oral QHS Garvin Fila, MD   40 mg at 03/10/20 2127  . thiamine tablet 100 mg  100 mg Oral Daily Garvin Fila, MD   100 mg at 03/11/20 1007     Discharge Medications: Please see discharge summary for a list of discharge medications.  Relevant Imaging Results:  Relevant Lab Results:   Additional Information SSN: [id number]  Bethann Berkshire, LCSW

## 2020-03-11 NOTE — Progress Notes (Signed)
Inpatient Rehabilitation Admissions Coordinator  I met at bedside with patient and per his request contacted his POA, Summer Parthasarathy who was designated this year per APS involvement. Patient will need longer rehab than CIR can offer before returning home independently and therefore SNF is recommended. I have notified TOC, Cyrus and acute team. We will sign off at this time.  Danne Baxter, RN, MSN Rehab Admissions Coordinator [phone] 03/11/2020 4:41 PM

## 2020-03-12 DIAGNOSIS — I4821 Permanent atrial fibrillation: Secondary | ICD-10-CM

## 2020-03-12 NOTE — Progress Notes (Signed)
Occupational Therapy Treatment Patient Details Name: Blake [name] MRN: [id number] DOB: 13-Apr-1941 Today's Date: 03/12/2020    History of present illness 79 yo male presenting with acute onset of L weakness, L hemianopia, and R gaze preference. CT Head negative; MRI negative. Pt received TPA in ED. PMH includes: CVA, HTN, HLD, GERD, ETOH abuse, COPD, smoker.   OT comments  Pt found asleep in recliner requesting to return to bed. Pt required [place] with Rw to complete stand pivot back to bed. Pt slightly impulsive throughout transfer needing cues for safety. Pt declined further ADL participation or functional mobility. Light MIN A to assist pt with returning to supine however pt able to assist with scooting up in bed. Agree with DC plan below, will follow acutely per POC.    Follow Up Recommendations  CIR;Supervision/Assistance - 24 hour    Equipment Recommendations  Tub/shower seat;3 in 1 bedside commode    Recommendations for Other Services      Precautions / Restrictions Precautions Precautions: Fall Precaution Comments: h/o falls Restrictions Weight Bearing Restrictions: No       Mobility Bed Mobility Overal bed mobility: Needs Assistance Bed Mobility: Sit to Supine     Supine to sit: HOB elevated;Min guard Sit to supine: Min assist   General bed mobility comments: light MIN A to return to supine; pt able to scoot self up in bed with cues and light mIN A with bed pad  Transfers Overall transfer level: Needs assistance Equipment used: Rolling walker (2 wheeled) Transfers: Sit to/from [facility] Sit to Stand: Min assist Stand pivot transfers: Min assist       General transfer comment: MIN A with cues for hand placement for sit<>stand, MIN A for balance and RW mgmt during stand pivot transfer back to bed    Balance Overall balance assessment: Needs assistance Sitting-balance support: Feet supported;Bilateral upper extremity supported Sitting  balance-Leahy Scale: Fair     Standing balance support: Bilateral upper extremity supported;During functional activity Standing balance-Leahy Scale: Poor Standing balance comment: needs external support from therapist in standing                           ADL either performed or assessed with clinical judgement   ADL Overall ADL's : Needs assistance/impaired                         Toilet Transfer: Minimal assistance;Stand-pivot;RW Toilet Transfer Details (indicate cue type and reason): simulated via stand pivot to back to bed with RW         Functional mobility during ADLs: Minimal assistance;Rolling walker;Cueing for safety General ADL Comments: pt continues to present with L sides weakness, decreased activity tolerance and generalized weakness impacting pts ability to engage in BADLs     Vision Baseline Vision/History: No visual deficits Patient Visual Report: Other (comment) (Pt reports improvements in vision with no blurry vision with pt able to track in all quadrants)     Perception     Praxis      Cognition Arousal/Alertness: Awake/alert Behavior During Therapy: Impulsive;Agitated Overall Cognitive Status: Impaired/Different from baseline Area of Impairment: Safety/judgement;Awareness                         Safety/Judgement: Decreased awareness of deficits;Decreased awareness of safety Awareness: Intellectual Problem Solving: Slow processing;Difficulty sequencing;Requires verbal cues;Requires tactile cues General Comments: pt slightly impulsive and easily agitated.  Exercises Exercises: Other exercises Other Exercises Other Exercises: Sit<>stand 3 ways for neuromuscular re-ed; 5x normal; 3x R LE to the side; 3x R LE in front   Shoulder Instructions       General Comments pt asking when is "Dominyk" coming, pt reports [place] is his friend that drives him around and gets him groceries    Pertinent Vitals/ Pain       Pain  Assessment: No/denies pain  Home Living                                          Prior Functioning/Environment              Frequency  Min 2X/week        Progress Toward Goals  OT Goals(current goals can now be found in the care plan section)  Progress towards OT goals: Progressing toward goals  Acute Rehab OT Goals Patient Stated Goal: to go home ASAP  OT Goal Formulation: With patient Time For Goal Achievement: 03/24/20 Potential to Achieve Goals: Good  Plan Discharge plan remains appropriate;Frequency remains appropriate    Co-evaluation                 AM-PAC OT "6 Clicks" Daily Activity     Outcome Measure   Help from another person eating meals?: A Little Help from another person taking care of personal grooming?: A Little Help from another person toileting, which includes using toliet, bedpan, or urinal?: A Lot Help from another person bathing (including washing, rinsing, drying)?: A Lot Help from another person to put on and taking off regular upper body clothing?: A Lot Help from another person to put on and taking off regular lower body clothing?: A Lot 6 Click Score: 14    End of Session Equipment Utilized During Treatment: Gait belt;Rolling walker  OT Visit Diagnosis: Unsteadiness on feet (R26.81);Cognitive communication deficit (R41.841) Symptoms and signs involving cognitive functions: Cerebral infarction   Activity Tolerance Patient tolerated treatment well   Patient Left in bed;with call bell/phone within reach;with bed alarm set   Nurse Communication Mobility status        Time: 0938-1829 OT Time Calculation (min): 10 min  Charges: OT General Charges $OT Visit: 1 Visit OT Treatments $Therapeutic Activity: 8-22 mins  Lanier Clam., COTA/L Acute Rehabilitation Services [phone]    Ihor Gully 03/12/2020, 4:46 PM

## 2020-03-12 NOTE — Progress Notes (Signed)
Physical Therapy Treatment Patient Details Name: Blake [name] MRN: [id number] DOB: Jul 08, 1941 Today's Date: 03/12/2020    History of Present Illness 79 yo male presenting with acute onset of L weakness, L hemianopia, and R gaze preference. CT Head negative; MRI negative. Pt received TPA in ED. PMH includes: CVA, HTN, HLD, GERD, ETOH abuse, COPD, smoker.    PT Comments    Pt was able to progress OOB to recliner chair with min A +2. After performing sit<>stand for neuromuscular re-ed pt was too fatigued to participate in gait training. Patient would benefit from continued skilled PT to maximize functional independence and activity tolerance. Will continue to follow acutely.     Follow Up Recommendations  SNF     Equipment Recommendations  None recommended by PT    Recommendations for Other Services       Precautions / Restrictions Precautions Precautions: Fall Precaution Comments: h/o falls Restrictions Weight Bearing Restrictions: No    Mobility  Bed Mobility Overal bed mobility: Needs Assistance Bed Mobility: Supine to Sit     Supine to sit: HOB elevated;Min guard     General bed mobility comments: Reaching and holding PTA's hand during mobility but did not require any assist  Transfers Overall transfer level: Needs assistance Equipment used: 1 person hand held assist Transfers: Sit to/from Stand Sit to Stand: Min assist         General transfer comment: Min A with cues for technique to rise up from EOB. HHA given for stability. Uncontrolled descent requiring assist to slow. Does not improve with cueing.   Ambulation/Gait Ambulation/Gait assistance: Min assist;+2 safety/equipment Gait Distance (Feet): 5 Feet Assistive device: 2 person hand held assist Gait Pattern/deviations: Step-through pattern;Shuffle;Trunk flexed Gait velocity: decreased   General Gait Details: HHA +2 to take small shuffling steps from EOB to recliner chair. Pt too fatigued after  exercise to complete additional ambulation.   Stairs             Wheelchair Mobility    Modified Rankin (Stroke Patients Only) Modified Rankin (Stroke Patients Only) Pre-Morbid Rankin Score: Slight disability Modified Rankin: Moderately severe disability     Balance Overall balance assessment: Needs assistance Sitting-balance support: Feet supported;Bilateral upper extremity supported Sitting balance-Leahy Scale: Fair     Standing balance support: Bilateral upper extremity supported;During functional activity Standing balance-Leahy Scale: Poor Standing balance comment: needs external support from therapist in standing                            Cognition Arousal/Alertness: Awake/alert Behavior During Therapy: Impulsive Overall Cognitive Status: Impaired/Different from baseline Area of Impairment: Safety/judgement;Awareness                         Safety/Judgement: Decreased awareness of deficits;Decreased awareness of safety Awareness: Intellectual Problem Solving: Slow processing;Difficulty sequencing;Requires verbal cues;Requires tactile cues General Comments: Slow processing and requires repeat VC for safety and technique. HOH      Exercises Other Exercises Other Exercises: Sit<>stand 3 ways for neuromuscular re-ed; 5x normal; 3x R LE to the side; 3x R LE in front    General Comments        Pertinent Vitals/Pain Pain Assessment: No/denies pain    Home Living                      Prior Function            PT  Goals (current goals can now be found in the care plan section) Acute Rehab PT Goals Patient Stated Goal: to go home ASAP  PT Goal Formulation: Patient unable to participate in goal setting Time For Goal Achievement: 03/22/20 Potential to Achieve Goals: Fair Progress towards PT goals: Progressing toward goals    Frequency    Min 3X/week      PT Plan Current plan remains appropriate    Co-evaluation               AM-PAC PT "6 Clicks" Mobility   Outcome Measure  Help needed turning from your back to your side while in a flat bed without using bedrails?: A Little Help needed moving from lying on your back to sitting on the side of a flat bed without using bedrails?: A Little Help needed moving to and from a bed to a chair (including a wheelchair)?: A Little Help needed standing up from a chair using your arms (e.g., wheelchair or bedside chair)?: A Lot Help needed to walk in hospital room?: A Little Help needed climbing 3-5 steps with a railing? : A Lot 6 Click Score: 16    End of Session Equipment Utilized During Treatment: Gait belt Activity Tolerance: Patient limited by fatigue Patient left: in chair;with call bell/phone within reach;with chair alarm set Nurse Communication: Mobility status PT Visit Diagnosis: Unsteadiness on feet (R26.81);Difficulty in walking, not elsewhere classified (R26.2);Muscle weakness (generalized) (M62.81)     Time: 1638-4536 PT Time Calculation (min) (ACUTE ONLY): 21 min  Charges:  $Neuromuscular Re-education: 8-22 mins                     Benjiman Core, [place] Pager [phone] Acute Rehab   Allena Katz 03/12/2020, 1:56 PM

## 2020-03-12 NOTE — TOC CAGE-AID Note (Signed)
Transition of Care [facility]) - CAGE-AID Screening   Patient Details  Name: Blake [name] MRN: [id number] Date of Birth: 02-06-1941  Transition of Care [facility]) CM/SW Contact:    Emeterio Reeve, [place] Phone Number: 03/12/2020, 1:38 PM   Clinical Narrative: PT reports he has 2-4 beers most days of the week. Pt denied substance use. Pt states he sees nothing wrong with his alcohol use and denied resources.    CAGE-AID Screening:    Have You Ever Felt You Ought to Cut Down on Your Drinking or Drug Use?: No Have People Annoyed You By Critizing Your Drinking Or Drug Use?: No Have You Felt Bad Or Guilty About Your Drinking Or Drug Use?: No Have You Ever Had a Drink or Used Drugs First Thing In The Morning to STeady Your Nerves or to Get Rid of a Hangover?: No CAGE-AID Score: 0  Substance Abuse Education Offered: Yes  Substance abuse interventions: Patient Counseling  Emeterio Reeve, Latanya Presser, [place] Social Worker [phone]

## 2020-03-12 NOTE — Progress Notes (Signed)
STROKE TEAM PROGRESS NOTE   INTERVAL HISTORY:  Patient sitting in bed, initially sleeping, but easily arousable, able to remain wakefulness.  Discussed with SNF placement, patient is in agreement.  Denies any discomfort.  No acute event overnight.  Vitals:   03/11/20 1828 03/11/20 1911 03/11/20 2332 03/12/20 0328  BP: (!) 171/72 (!) 171/81 (!) 146/62 (!) 157/77  Pulse: (!) 58 67 (!) 56 65  Resp: 20 18 18 18   Temp: 98.2 F (36.8 C) 98.1 F (36.7 C) 98.5 F (36.9 C) 98.2 F (36.8 C)  TempSrc: Oral Oral  Oral  SpO2: 98% 98% 98% 96%  Weight:      Height:       CBC:  Recent Labs  Lab 03/06/20 1648 03/06/20 1655 03/10/20 0931 03/11/20 0723  WBC 15.1*   < > 8.9 10.9*  NEUTROABS 13.1*  --   --   --   HGB 13.5   < > 11.9* 11.7*  HCT 42.0   < > 37.6* 35.5*  MCV 95.2   < > 96.2 94.2  PLT 233   < > 232 221   < > = values in this interval not displayed.   Basic Metabolic Panel:  Recent Labs  Lab 03/10/20 0931 03/11/20 0723  NA 137 139  K 3.6 3.6  CL 110 110  CO2 21* 21*  GLUCOSE 95 96  BUN 18 18  CREATININE 1.64* 1.55*  CALCIUM 8.2* 8.4*   Lipid Panel:     Component Value Date/Time   CHOL 147 03/07/2020 0609   TRIG 78 03/07/2020 0609   HDL 47 03/07/2020 0609   CHOLHDL 3.1 03/07/2020 0609   VLDL 16 03/07/2020 0609   LDLCALC 84 03/07/2020 0609   HgbA1c:  Lab Results  Component Value Date   HGBA1C 5.3 03/07/2020   Urine Drug Screen:     Component Value Date/Time   LABOPIA NONE DETECTED 03/09/2020 1437   COCAINSCRNUR NONE DETECTED 03/09/2020 1437   LABBENZ POSITIVE (A) 03/09/2020 1437   AMPHETMU NONE DETECTED 03/09/2020 1437   THCU NONE DETECTED 03/09/2020 1437   LABBARB NONE DETECTED 03/09/2020 1437    Alcohol Level     Component Value Date/Time   ETH <10 03/06/2020 1648    IMAGING No results found.   PHYSICAL EXAM    Temp:  [98.1 F (36.7 C)-98.5 F (36.9 C)] 98.2 F (36.8 C) (06/16 0328) Pulse Rate:  [53-70] 65 (06/16 0328) Resp:  [18-28] 18  (06/16 0328) BP: (146-171)/(62-87) 157/77 (06/16 0328) SpO2:  [95 %-98 %] 96 % (06/16 0328)  General - Well nourished, well developed, in no apparent distress.  Ophthalmologic - fundi not visualized due to noncooperation.  Cardiovascular - irregularly irregular heart rate and rhythm, with frequent bradycardia.  Mental Status -  Level of arousal and orientation to time, place, and person were intact. Language including expression, naming, repetition, comprehension was assessed and found intact. Mild dysarthria due to poor denture. Mild psychomotor slowing due to hard of hearing  Cranial Nerves II - XII - II - Visual field intact OU. III, IV, VI - Extraocular movements intact. V - Facial sensation intact bilaterally. VII - Facial movement intact bilaterally. VIII - hard of hearing & vestibular intact bilaterally. X - Palate elevates symmetrically. Poor denture, mild dysarthria XI - Chin turning & shoulder shrug intact bilaterally. XII - Tongue protrusion intact.  Motor Strength - The patient's strength was normal in all extremities and pronator drift was absent.  Bulk was normal and fasciculations  were absent.   Motor Tone - Muscle tone was assessed at the neck and appendages and was normal.  Reflexes - The patient's reflexes were symmetrical in all extremities and he had no pathological reflexes.  Sensory - Light touch, temperature/pinprick were assessed and were symmetrical.    Coordination - The patient had normal movements in the hands with no ataxia or dysmetria.  Tremor was absent.  Gait and Station - deferred.   ASSESSMENT/PLAN: [name] is a 79 y.o. male with history of AF not on [facility], currently noncompliant w/ all meds presenting 03/06/20 withL sided weakness.Received tPA 03/06/2020 at 1719.  TIA vs. Aborted stroke vs. Seizure  CT head - No acute abnormality. Small vessel disease progressed since 2020. ASPECTS 10.   CTA head & neck - no LVO.chronic severe  B PCA stenosis w/ L occlusion / near occlusion. ICA/VA plaque w/o stenosis   MRI 6/12 -No acute intracranial abnormality.   2D Echo - EF 60 - 65%. No cardiac source of emboli identified.   EEG - neg sz, lateralized R brain  SARS - negative  UDS-benzo  LDL84  HgbA1c5.3  eliquis for VTE prophylaxis   aspirin 325 mg dailyprior to admission, now onEliquis for afib stroke prevention. Continue on discharge.  Therapy recommendations: SNF   Disposition: pending  (Lived alone PTA)  Medically ready for d/c once SNF bed found  Hx of seizure  Sz hx - on Keppra(not taking)  EEG neg sz, lateralized R brain - ?post ictal  OnKeppraXR1,000 mg daily  Atrial Fibrillation Bradycardia  Cardiologyon board  mobitz I second degree AV block. No acute indication for pacing. Avoid AV nodal agents.  HR down to 33 during the night  HR improves during wakefulness  Not on [facility] PTA  Now on eliquis for stroke prevention  Hx stroke/TIA  12/2018 - Incidental R caudate infarct not related to presenting symptoms, secondary to small vessel disease   All previous 2020 MRIs neg for stroke  Medical non-compliance  Hypertension  Home meds: Lasix 20   Treated with cardene post tPA, now off  BPstable  Long-term BP goal normotensive  Hyperlipidemia  Home meds: lipitor 20 (not taking)  LDL 84, goal < 70  Resumelipitor 20  Continue statin at discharge  Dysphagia Protein calorie malnutrition, severe   MBSS - cleared for Dys 2 thin liquid diet  Speech on board  Ensure Enlive  Tobacco abuse  Current smoker  Smoking cessation counseling provided  Pt is willing to quit  Other Stroke Risk Factors  Advanced age  ETOH abuse, alcohol level <10, advised to drink no more than 2 drink(s) a day  Other Active Problems  Metabolic encephalopathy w/ confusion in an alcoholic male w/ hx seizures. Likely Baseline dementia from 2020 note. On  seroquel  GERD  CKD - stage 3b - (GFR - 36) - CRE 1.75->1.7-1.77-1.64-1.55  leukocytosis - WBC 8.9->10.9  Hospital day # 6   Blake Hawking, MD PhD Stroke Neurology 03/12/2020 3:11 PM  To contact Stroke Continuity provider, please refer to [email or web address] After hours, contact General Neurology

## 2020-03-13 DIAGNOSIS — D72829 Elevated white blood cell count, unspecified: Secondary | ICD-10-CM

## 2020-03-13 LAB — RESPIRATORY PANEL BY RT PCR (FLU A&B, COVID)
Influenza A by PCR: NEGATIVE
Influenza B by PCR: NEGATIVE
SARS Coronavirus 2 by RT PCR: NEGATIVE

## 2020-03-13 NOTE — Progress Notes (Signed)
STROKE TEAM PROGRESS NOTE   INTERVAL HISTORY:  No family at bedside. Pt NT is helping pt eat dinner. Pt no acute event overnight. Pending insurance approval for discharge to SNF.  Vitals:   03/13/20 0000 03/13/20 0416 03/13/20 0825 03/13/20 1235  BP: (!) 171/86 (!) 159/73 (!) 166/59 (!) 164/74  Pulse: 63 68 (!) 58 68  Resp: 20 16 16 16   Temp: 98.1 F (36.7 C) 97.7 F (36.5 C) 97.7 F (36.5 C) 98.3 F (36.8 C)  TempSrc: Oral Oral Oral Oral  SpO2: 97% 99% 98% 97%  Weight:      Height:       CBC:  Recent Labs  Lab 03/06/20 1648 03/06/20 1655 03/10/20 0931 03/11/20 0723  WBC 15.1*   < > 8.9 10.9*  NEUTROABS 13.1*  --   --   --   HGB 13.5   < > 11.9* 11.7*  HCT 42.0   < > 37.6* 35.5*  MCV 95.2   < > 96.2 94.2  PLT 233   < > 232 221   < > = values in this interval not displayed.   Basic Metabolic Panel:  Recent Labs  Lab 03/10/20 0931 03/11/20 0723  NA 137 139  K 3.6 3.6  CL 110 110  CO2 21* 21*  GLUCOSE 95 96  BUN 18 18  CREATININE 1.64* 1.55*  CALCIUM 8.2* 8.4*   Lipid Panel:     Component Value Date/Time   CHOL 147 03/07/2020 0609   TRIG 78 03/07/2020 0609   HDL 47 03/07/2020 0609   CHOLHDL 3.1 03/07/2020 0609   VLDL 16 03/07/2020 0609   LDLCALC 84 03/07/2020 0609   HgbA1c:  Lab Results  Component Value Date   HGBA1C 5.3 03/07/2020   Urine Drug Screen:     Component Value Date/Time   LABOPIA NONE DETECTED 03/09/2020 1437   COCAINSCRNUR NONE DETECTED 03/09/2020 1437   LABBENZ POSITIVE (A) 03/09/2020 1437   AMPHETMU NONE DETECTED 03/09/2020 1437   THCU NONE DETECTED 03/09/2020 1437   LABBARB NONE DETECTED 03/09/2020 1437    Alcohol Level     Component Value Date/Time   ETH <10 03/06/2020 1648    IMAGING No results found.   PHYSICAL EXAM    Temp:  [97.7 F (36.5 C)-98.7 F (37.1 C)] 98.3 F (36.8 C) (06/17 1235) Pulse Rate:  [58-70] 68 (06/17 1235) Resp:  [16-20] 16 (06/17 1235) BP: (159-171)/(59-86) 164/74 (06/17 1235) SpO2:  [97  %-100 %] 97 % (06/17 1235)  General - Well nourished, well developed, in no apparent distress.  Ophthalmologic - fundi not visualized due to noncooperation.  Cardiovascular - irregularly irregular heart rate and rhythm, with frequent bradycardia.  Mental Status -  Level of arousal and orientation to time, place, and person were intact. Language including expression, naming, repetition, comprehension was assessed and found intact. Mild dysarthria due to poor denture. Mild psychomotor slowing due to hard of hearing  Cranial Nerves II - XII - II - Visual field intact OU. III, IV, VI - Extraocular movements intact. V - Facial sensation intact bilaterally. VII - Facial movement intact bilaterally. VIII - hard of hearing & vestibular intact bilaterally. X - Palate elevates symmetrically. Poor denture, mild dysarthria XI - Chin turning & shoulder shrug intact bilaterally. XII - Tongue protrusion intact.  Motor Strength - The patient's strength was normal in all extremities and pronator drift was absent.  Bulk was normal and fasciculations were absent.   Motor Tone - Muscle tone  was assessed at the neck and appendages and was normal.  Reflexes - The patient's reflexes were symmetrical in all extremities and he had no pathological reflexes.  Sensory - Light touch, temperature/pinprick were assessed and were symmetrical.    Coordination - The patient had normal movements in the hands with no ataxia or dysmetria.  Tremor was absent.  Gait and Station - deferred.   ASSESSMENT/PLAN: [name] is a 79 y.o. male with history of AF not on [facility], currently noncompliant w/ all meds presenting 03/06/20 withL sided weakness.Received tPA 03/06/2020 at 1719.  TIA vs. Aborted stroke vs. Seizure  CT head - No acute abnormality. Small vessel disease progressed since 2020. ASPECTS 10.   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 ICA/VA plaque w/o  stenosis   MRI 6/12 -No acute intracranial abnormality.   2D Echo - EF 60 - 65%. No cardiac source of emboli identified.   EEG - neg sz, lateralized R brain  SARS - negative  UDS-benzo  LDL84  HgbA1c5.3  Eliquis for VTE prophylaxis  aspirin 325 mg dailyprior to admission,now onEliquis for afib stroke prevention. Continue on discharge.  Therapy recommendations:SNF  Disposition:SNF(Lived alone PTA)  COVID neg 6/17 for SNF placement  Hx of seizure  Sz hx - on Keppra(not taking)  EEG neg sz, lateralized R brain - ?post ictal  OnKeppraXR1,000 mg daily  Atrial Fibrillation Bradycardia  Cardiologyconsulted  mobitz I second degree AV block. No acute indication for pacing. Avoid AV nodal agents.  HR down to 33 during the night  HR improves during wakefulness  Not on [facility] PTA  Now on eliquis for stroke prevention  Hx stroke/TIA  12/2018 - Incidental R caudate infarct not related to presenting symptoms, secondary to small vessel disease   All previous 2020 MRIs neg for stroke  Medical non-compliance  Hypertension  Home meds: Lasix 20   Treated with cardene post tPA, now off  BPstable  Long-term BP goal normotensive  Hyperlipidemia  Home meds: lipitor 20 (not taking)  LDL 84, goal < 70  Resumelipitor 20  Continue statin at discharge  Dysphagia Protein calorie malnutrition, severe  MBSS - cleared for Dys 2 thin liquid diet  Ensure Enlive  Tobacco abuse  Current smoker  Smoking cessation counseling provided  Pt is willing to quit  Other Stroke Risk Factors  Advanced age  ETOH abuse, alcohol level <10, advised to drink no more than 2 drink(s) a day  Other Active Problems  Metabolic encephalopathy w/ confusion in an alcoholic male w/ hx seizures. Likely Baseline dementia from 2020 note. On seroquel  GERD  CKD - stage 3b Cre 1.64->1.55  Leukocytosis - WBC 8.9->10.9  Hospital day # 7    Blake Hawking, MD PhD Stroke Neurology 03/13/2020 3:24 PM  To contact Stroke Continuity provider, please refer to [email or web address] After hours, contact General Neurology

## 2020-03-13 NOTE — Progress Notes (Signed)
  Speech Language Pathology Treatment: Dysphagia  Patient Details Name: Blake [name] MRN: [id number] DOB: 1941/01/03 Today's Date: 03/13/2020 Time: 0370-4888 SLP Time Calculation (min) (ACUTE ONLY): 9 min  Assessment / Plan / Recommendation Clinical Impression  Pt's swallow appears functional until delayed cough begins toward the end of PO intake. Suspect that this is related to his h/o esophageal issues with suspected backflow into the pharynx as was observed on MBS. Pt was given Min cues throughout trials for use of esophageal precautions and was instructed to take a break as coughing began. Pt appears to be at his baseline level of function for swallowing when compared to previous evaluations. Note that plan is for d/c to SNF. SLP will sign off acutely.    HPI HPI: Pt is a 79 yo male presenting with acute onset of L weakness, L hemianopia, and R gaze preference. CT Head negative; MRI pending. MBS 07/15/18 primary esophageal dysphagia, backflow from esophagus into pharynx, residue throughout esophagus. Flash penetration of thin. MBS 01/10/19 with mild oral dysphagia and persistent esophageal dysphagia. PMH includes: CVA, HTN, HLD, GERD, ETOH abuse, COPD, smoker, HH, esophageal dilation 2019, tortuous esophagus      SLP Plan  All goals met       Recommendations  Diet recommendations: Dysphagia 2 (fine chop);Thin liquid Liquids provided via: Cup;No straw Medication Administration: Crushed with puree Supervision: Patient able to self feed;Intermittent supervision to cue for compensatory strategies Compensations: Minimize environmental distractions;Slow rate;Small sips/bites;Follow solids with liquid Postural Changes and/or Swallow Maneuvers: Seated upright 90 degrees;Upright 30-60 min after meal                Oral Care Recommendations: Oral care BID Follow up Recommendations: 24 hour supervision/assistance SLP Visit Diagnosis: Dysphagia, pharyngoesophageal phase (R13.14) Plan: All  goals met       GO                Osie Bond., M.A. [place] [facility] Pager [phone] Office [phone]  03/13/2020, 11:37 AM

## 2020-03-13 NOTE — TOC Progression Note (Signed)
Transition of Care [facility]) - Progression Note    Patient Details  Name: Blake [name] MRN: [id number] Date of Birth: Jan 04, 1941  Transition of Care [facility]) CM/SW [place], [place] Phone Number: 03/13/2020, 10:52 AM  Clinical Narrative:   CSW initiated insurance authorization through [facility] for transition to SNF. CSW met with patient to discuss bed offers, and patient preferred for CSW to talk to his POA, [place]. CSW called and spoke with [place] to discuss bed offers. Toney preferred [facility] in [place] [facility] but they are unable to offer, and [facility] will be the next closest facility. CSW asked MD to order COVID test for hopeful transition to SNF today pending authorization request. CSW to follow.    Expected Discharge Plan: Skilled Nursing Facility Barriers to Discharge: Insurance Authorization  Expected Discharge Plan and Services Expected Discharge Plan: [place] Choice: [place] arrangements for the past 2 months: Single Family Home                   DME Agency: NA                   Social Determinants of Health (SDOH) Interventions    Readmission Risk Interventions No flowsheet data found.

## 2020-03-14 MED ORDER — APIXABAN 5 MG PO TABS: 5.0000 mg | ORAL_TABLET | Freq: Two times a day (BID) | ORAL | Status: DC

## 2020-03-14 MED ORDER — STARCH (THICKENING) PO POWD: ORAL | 0 refills | Status: DC

## 2020-03-14 MED ORDER — PANTOPRAZOLE SODIUM 40 MG PO TBEC: 40.0000 mg | DELAYED_RELEASE_TABLET | Freq: Every day | ORAL | Status: DC

## 2020-03-14 MED ORDER — HYDRALAZINE HCL 20 MG/ML IJ SOLN: 20.0000 mg | INTRAMUSCULAR | Status: DC | PRN

## 2020-03-14 MED ORDER — ORAL CARE MOUTH RINSE: 15.0000 mL | Freq: Two times a day (BID) | OROMUCOSAL | 0 refills | Status: DC

## 2020-03-14 MED ORDER — ENSURE ENLIVE PO LIQD: 237.0000 mL | Freq: Two times a day (BID) | ORAL | 12 refills | Status: DC

## 2020-03-14 NOTE — TOC Transition Note (Signed)
Transition of Care [facility]) - CM/SW Discharge Note   Patient Details  Name: Blake [name] MRN: [id number] Date of Birth: [birth year]  Transition of Care [facility]) CM/SW Contact:  Geralynn Ochs, LCSW Phone Number: 03/14/2020, 4:29 PM   Clinical Narrative:   Nurse to call report to [phone], Room 3217  Transport set for 6:00 PM.    Final next level of care: [place] Barriers to Discharge: Barriers Resolved   Patient Goals and CMS Choice Patient states their goals for this hospitalization and ongoing recovery are:: "I'm going to go home." CMS Medicare.gov Compare Post Acute Care list provided to:: Patient Choice offered to / list presented to : Patient  Discharge Placement              Patient chooses bed at: [facility] Patient to be transferred to facility by: [place] Name of family member notified: Ahmir Patient and family notified of of transfer: 03/14/20  Discharge Plan and Services     Post Acute Care Choice: Home Health            DME Agency: NA                  Social Determinants of Health (SDOH) Interventions     Readmission Risk Interventions No flowsheet data found.

## 2020-03-14 NOTE — TOC Progression Note (Signed)
Transition of Care [facility]) - Progression Note    Patient Details  Name: Blake [name] MRN: [id number] Date of Birth: October 03, 1940  Transition of Care [facility]) CM/SW [place], [place] Phone Number: 03/14/2020, 4:19 PM  Clinical Narrative:   CSW worked throughout the day on patient's discharge. CSW notified that [facility] can't take the patient as he owes money. CSW discussed with POA [facility] and [facility] for SNF. [facility] called CSW back and he also owes money to [facility]. CSW discussed with [facility], and [facility] was chosen. CSW contacted [employer] to ask about auth status, and they had entered auth for CIR Request instead of SNF. CSW rebuilt case, and received authorization. CSW sent to [facility] along with discharge, but [facility] has not responded to complete paperwork for patient to admit. CSW to follow up tomorrow on discharge to [facility].     Expected Discharge Plan: Skilled Nursing Facility Barriers to Discharge: Insurance Authorization  Expected Discharge Plan and Services Expected Discharge Plan: [place] Choice: [place] arrangements for the past 2 months: Single Family Home Expected Discharge Date: 03/14/20                 DME Agency: NA                   Social Determinants of Health (SDOH) Interventions    Readmission Risk Interventions No flowsheet data found.

## 2020-03-14 NOTE — Progress Notes (Signed)
Patient is discharging to SNF. All discharge paperwork will be sent with [facility]. This RN called report to receiving facility, all questions answered. All belongings will be sent with patient. [place]

## 2020-03-14 NOTE — Progress Notes (Signed)
Physical Therapy Treatment Patient Details Name: Blake [name] MRN: [id number] DOB: 05-12-1941 Today's Date: 03/14/2020    History of Present Illness 79 yo male presenting with acute onset of L weakness, L hemianopia, and R gaze preference. CT Head negative; MRI negative. Pt received TPA in ED. PMH includes: CVA, HTN, HLD, GERD, ETOH abuse, COPD, smoker.    PT Comments    Patient seen for mobility progression. Pt requires min A for functional transfer/gait training with RW. Continue to progress as tolerated with anticipated d/c to SNF for further skilled PT services.     Follow Up Recommendations  SNF     Equipment Recommendations  None recommended by PT    Recommendations for Other Services       Precautions / Restrictions Precautions Precautions: Fall Precaution Comments: h/o falls Restrictions Weight Bearing Restrictions: No    Mobility  Bed Mobility Overal bed mobility: Needs Assistance Bed Mobility: Sit to Supine;Supine to Sit     Supine to sit: HOB elevated;Supervision Sit to supine: Supervision   General bed mobility comments: cues for sequencing to reposition in bed and use of rails   Transfers Overall transfer level: Needs assistance Equipment used: Rolling walker (2 wheeled) Transfers: Sit to/from Stand Sit to Stand: Min assist         General transfer comment: pt stood X 2 from EOB; cues for safe hand placement however pt pulling on RW; assist to power up into standing and to steady upon standing   Ambulation/Gait Ambulation/Gait assistance: Min assist Gait Distance (Feet): 70 Feet Assistive device: Rolling walker (2 wheeled) Gait Pattern/deviations: Step-through pattern;Shuffle;Trunk flexed;Decreased stance time - right;Decreased step length - right;Decreased step length - left;Antalgic Gait velocity: decreased   General Gait Details: cues for increased bilat step lengths (no change noted with cues) and for upright posture; assist to steady     Stairs             Wheelchair Mobility    Modified Rankin (Stroke Patients Only) Modified Rankin (Stroke Patients Only) Pre-Morbid Rankin Score: Slight disability Modified Rankin: Moderately severe disability     Balance Overall balance assessment: Needs assistance Sitting-balance support: Feet supported Sitting balance-Leahy Scale: Fair     Standing balance support: Bilateral upper extremity supported;During functional activity Standing balance-Leahy Scale: Poor                              Cognition Arousal/Alertness: Awake/alert Behavior During Therapy: Agitated;Impulsive (agitated easily but also easily redirected ) Overall Cognitive Status: Impaired/Different from baseline Area of Impairment: Safety/judgement;Awareness                   Current Attention Level: Selective Memory: Decreased Tuch-term memory Following Commands: Follows one step commands consistently Safety/Judgement: Decreased awareness of deficits;Decreased awareness of safety Awareness: Intellectual Problem Solving: Slow processing;Difficulty sequencing;Requires verbal cues;Requires tactile cues        Exercises      General Comments        Pertinent Vitals/Pain Pain Location: pt denies pain or states "i don't know" when asked about pain; antalgic gait pattern    Home Living                      Prior Function            PT Goals (current goals can now be found in the care plan section) Progress towards PT goals: Progressing toward goals  Frequency    Min 3X/week      PT Plan Current plan remains appropriate    Co-evaluation              AM-PAC PT "6 Clicks" Mobility   Outcome Measure  Help needed turning from your back to your side while in a flat bed without using bedrails?: A Little Help needed moving from lying on your back to sitting on the side of a flat bed without using bedrails?: A Little Help needed moving to and  from a bed to a chair (including a wheelchair)?: A Little Help needed standing up from a chair using your arms (e.g., wheelchair or bedside chair)?: A Little Help needed to walk in hospital room?: A Little Help needed climbing 3-5 steps with a railing? : A Lot 6 Click Score: 17    End of Session Equipment Utilized During Treatment: Gait belt Activity Tolerance: Patient tolerated treatment well Patient left: with call bell/phone within reach;in bed;with bed alarm set Nurse Communication: Mobility status PT Visit Diagnosis: Unsteadiness on feet (R26.81);Difficulty in walking, not elsewhere classified (R26.2);Muscle weakness (generalized) (M62.81)     Time: 4818-5631 PT Time Calculation (min) (ACUTE ONLY): 14 min  Charges:  $Gait Training: 8-22 mins                     Earney Navy, PTA Acute Rehabilitation Services Pager: [phone] Office: [phone]     Darliss Cheney 03/14/2020, 4:23 PM

## 2020-03-14 NOTE — Progress Notes (Signed)
[facility] transporting pt to SNF. Pt given all belongings and discharge paperwork, pt has no questions.

## 2020-04-07 ENCOUNTER — Encounter (HOSPITAL_COMMUNITY): Payer: Self-pay

## 2020-04-07 ENCOUNTER — Inpatient Hospital Stay (HOSPITAL_COMMUNITY): Admit: 2020-04-07 | Payer: Medicare Other | Admitting: Nurse Practitioner

## 2020-04-20 ENCOUNTER — Inpatient Hospital Stay (HOSPITAL_COMMUNITY)
Admission: EM | Admit: 2020-04-20 | Discharge: 2020-04-23 | DRG: 896 | Disposition: A | Discharge status: Home Health | Payer: Medicare Other | Attending: Internal Medicine | Admitting: Internal Medicine

## 2020-04-20 ENCOUNTER — Encounter (HOSPITAL_COMMUNITY): Payer: Self-pay | Admitting: Emergency Medicine

## 2020-04-20 ENCOUNTER — Emergency Department (HOSPITAL_COMMUNITY): Payer: Medicare Other

## 2020-04-20 DIAGNOSIS — F1010 Alcohol abuse, uncomplicated: Secondary | ICD-10-CM | POA: Diagnosis present

## 2020-04-20 DIAGNOSIS — J9602 Acute respiratory failure with hypercapnia: Secondary | ICD-10-CM | POA: Diagnosis present

## 2020-04-20 DIAGNOSIS — Z801 Family history of malignant neoplasm of trachea, bronchus and lung: Secondary | ICD-10-CM

## 2020-04-20 DIAGNOSIS — K219 Gastro-esophageal reflux disease without esophagitis: Secondary | ICD-10-CM | POA: Diagnosis present

## 2020-04-20 DIAGNOSIS — Z8673 Personal history of transient ischemic attack (TIA), and cerebral infarction without residual deficits: Secondary | ICD-10-CM

## 2020-04-20 DIAGNOSIS — Z20822 Contact with and (suspected) exposure to covid-19: Secondary | ICD-10-CM | POA: Diagnosis present

## 2020-04-20 DIAGNOSIS — I441 Atrioventricular block, second degree: Secondary | ICD-10-CM | POA: Diagnosis present

## 2020-04-20 DIAGNOSIS — E872 Acidosis: Secondary | ICD-10-CM | POA: Diagnosis present

## 2020-04-20 DIAGNOSIS — K5730 Diverticulosis of large intestine without perforation or abscess without bleeding: Secondary | ICD-10-CM | POA: Diagnosis present

## 2020-04-20 DIAGNOSIS — E119 Type 2 diabetes mellitus without complications: Secondary | ICD-10-CM

## 2020-04-20 DIAGNOSIS — R1310 Dysphagia, unspecified: Secondary | ICD-10-CM

## 2020-04-20 DIAGNOSIS — F10239 Alcohol dependence with withdrawal, unspecified: Principal | ICD-10-CM | POA: Diagnosis present

## 2020-04-20 DIAGNOSIS — G40909 Epilepsy, unspecified, not intractable, without status epilepticus: Secondary | ICD-10-CM

## 2020-04-20 DIAGNOSIS — Z79899 Other long term (current) drug therapy: Secondary | ICD-10-CM

## 2020-04-20 DIAGNOSIS — R569 Unspecified convulsions: Secondary | ICD-10-CM

## 2020-04-20 DIAGNOSIS — Z8719 Personal history of other diseases of the digestive system: Secondary | ICD-10-CM

## 2020-04-20 DIAGNOSIS — Z7901 Long term (current) use of anticoagulants: Secondary | ICD-10-CM

## 2020-04-20 DIAGNOSIS — Z794 Long term (current) use of insulin: Secondary | ICD-10-CM

## 2020-04-20 DIAGNOSIS — I444 Left anterior fascicular block: Secondary | ICD-10-CM | POA: Diagnosis present

## 2020-04-20 DIAGNOSIS — I10 Essential (primary) hypertension: Secondary | ICD-10-CM | POA: Diagnosis present

## 2020-04-20 DIAGNOSIS — E1122 Type 2 diabetes mellitus with diabetic chronic kidney disease: Secondary | ICD-10-CM | POA: Diagnosis present

## 2020-04-20 DIAGNOSIS — J449 Chronic obstructive pulmonary disease, unspecified: Secondary | ICD-10-CM | POA: Diagnosis present

## 2020-04-20 DIAGNOSIS — Z9114 Patient's other noncompliance with medication regimen: Secondary | ICD-10-CM

## 2020-04-20 DIAGNOSIS — F10939 Alcohol use, unspecified with withdrawal, unspecified: Secondary | ICD-10-CM | POA: Diagnosis present

## 2020-04-20 DIAGNOSIS — N1832 Chronic kidney disease, stage 3b: Secondary | ICD-10-CM | POA: Diagnosis present

## 2020-04-20 DIAGNOSIS — I129 Hypertensive chronic kidney disease with stage 1 through stage 4 chronic kidney disease, or unspecified chronic kidney disease: Secondary | ICD-10-CM | POA: Diagnosis present

## 2020-04-20 DIAGNOSIS — Z833 Family history of diabetes mellitus: Secondary | ICD-10-CM

## 2020-04-20 DIAGNOSIS — F5104 Psychophysiologic insomnia: Secondary | ICD-10-CM | POA: Diagnosis present

## 2020-04-20 DIAGNOSIS — E785 Hyperlipidemia, unspecified: Secondary | ICD-10-CM | POA: Diagnosis present

## 2020-04-20 LAB — CBC WITH DIFFERENTIAL/PLATELET
Abs Immature Granulocytes: 0.1 10*3/uL — ABNORMAL HIGH (ref 0.00–0.07)
Basophils Absolute: 0.1 10*3/uL (ref 0.0–0.1)
Basophils Relative: 1 %
Eosinophils Absolute: 0.2 10*3/uL (ref 0.0–0.5)
Eosinophils Relative: 2 %
HCT: 37 % — ABNORMAL LOW (ref 39.0–52.0)
Hemoglobin: 11.9 g/dL — ABNORMAL LOW (ref 13.0–17.0)
Immature Granulocytes: 1 %
Lymphocytes Relative: 11 %
Lymphs Abs: 1.4 10*3/uL (ref 0.7–4.0)
MCH: 31.3 pg (ref 26.0–34.0)
MCHC: 32.2 g/dL (ref 30.0–36.0)
MCV: 97.4 fL (ref 80.0–100.0)
Monocytes Absolute: 0.6 10*3/uL (ref 0.1–1.0)
Monocytes Relative: 5 %
Neutro Abs: 10.2 10*3/uL — ABNORMAL HIGH (ref 1.7–7.7)
Neutrophils Relative %: 80 %
Platelets: 187 10*3/uL (ref 150–400)
RBC: 3.8 MIL/uL — ABNORMAL LOW (ref 4.22–5.81)
RDW: 14.3 % (ref 11.5–15.5)
WBC: 12.6 10*3/uL — ABNORMAL HIGH (ref 4.0–10.5)
nRBC: 0 % (ref 0.0–0.2)

## 2020-04-20 LAB — COMPREHENSIVE METABOLIC PANEL
ALT: 10 U/L (ref 0–44)
AST: 20 U/L (ref 15–41)
Albumin: 3.5 g/dL (ref 3.5–5.0)
Alkaline Phosphatase: 87 U/L (ref 38–126)
Anion gap: 11 (ref 5–15)
BUN: 18 mg/dL (ref 8–23)
CO2: 20 mmol/L — ABNORMAL LOW (ref 22–32)
Calcium: 8.7 mg/dL — ABNORMAL LOW (ref 8.9–10.3)
Chloride: 108 mmol/L (ref 98–111)
Creatinine, Ser: 1.74 mg/dL — ABNORMAL HIGH (ref 0.61–1.24)
GFR calc Af Amer: 43 mL/min — ABNORMAL LOW (ref >60)
GFR calc non Af Amer: 37 mL/min — ABNORMAL LOW (ref >60)
Glucose, Bld: 134 mg/dL — ABNORMAL HIGH (ref 70–99)
Potassium: 4.5 mmol/L (ref 3.5–5.1)
Sodium: 139 mmol/L (ref 135–145)
Total Bilirubin: 0.4 mg/dL (ref 0.3–1.2)
Total Protein: 6.4 g/dL — ABNORMAL LOW (ref 6.5–8.1)

## 2020-04-20 LAB — SARS CORONAVIRUS 2 BY RT PCR (HOSPITAL ORDER, PERFORMED IN ~~LOC~~ HOSPITAL LAB): SARS Coronavirus 2: NEGATIVE

## 2020-04-20 LAB — I-STAT CHEM 8, ED
BUN: 20 mg/dL (ref 8–23)
Calcium, Ion: 1.17 mmol/L (ref 1.15–1.40)
Chloride: 107 mmol/L (ref 98–111)
Creatinine, Ser: 1.6 mg/dL — ABNORMAL HIGH (ref 0.61–1.24)
Glucose, Bld: 130 mg/dL — ABNORMAL HIGH (ref 70–99)
HCT: 36 % — ABNORMAL LOW (ref 39.0–52.0)
Hemoglobin: 12.2 g/dL — ABNORMAL LOW (ref 13.0–17.0)
Potassium: 4.4 mmol/L (ref 3.5–5.1)
Sodium: 141 mmol/L (ref 135–145)
TCO2: 20 mmol/L — ABNORMAL LOW (ref 22–32)

## 2020-04-20 LAB — I-STAT ARTERIAL BLOOD GAS, ED
Acid-base deficit: 7 mmol/L — ABNORMAL HIGH (ref 0.0–2.0)
Bicarbonate: 20.9 mmol/L (ref 20.0–28.0)
Calcium, Ion: 1.27 mmol/L (ref 1.15–1.40)
HCT: 34 % — ABNORMAL LOW (ref 39.0–52.0)
Hemoglobin: 11.6 g/dL — ABNORMAL LOW (ref 13.0–17.0)
O2 Saturation: 95 %
Patient temperature: 98.6
Potassium: 4.5 mmol/L (ref 3.5–5.1)
Sodium: 141 mmol/L (ref 135–145)
TCO2: 22 mmol/L (ref 22–32)
pCO2 arterial: 52.7 mmHg — ABNORMAL HIGH (ref 32.0–48.0)
pH, Arterial: 7.206 — ABNORMAL LOW (ref 7.350–7.450)
pO2, Arterial: 92 mmHg (ref 83.0–108.0)

## 2020-04-20 LAB — SALICYLATE LEVEL: Salicylate Lvl: <7 mg/dL — ABNORMAL LOW (ref 7.0–30.0)

## 2020-04-20 LAB — ETHANOL: Alcohol, Ethyl (B): <10 mg/dL (ref <10)

## 2020-04-20 LAB — PROTIME-INR
INR: 1.1 (ref 0.8–1.2)
Prothrombin Time: 13.3 seconds (ref 11.4–15.2)

## 2020-04-20 LAB — CBG MONITORING, ED: Glucose-Capillary: 80 mg/dL (ref 70–99)

## 2020-04-20 LAB — TROPONIN I (HIGH SENSITIVITY)
Troponin I (High Sensitivity): 18 ng/L — ABNORMAL HIGH (ref <18)
Troponin I (High Sensitivity): 21 ng/L — ABNORMAL HIGH (ref <18)

## 2020-04-20 LAB — ACETAMINOPHEN LEVEL: Acetaminophen (Tylenol), Serum: <10 ug/mL — ABNORMAL LOW (ref 10–30)

## 2020-04-20 MED ORDER — LISINOPRIL 2.5 MG PO TABS
2.5000 mg | ORAL_TABLET | Freq: Every day | ORAL | Status: AC
  Administered 2020-04-20: 2.5 mg via ORAL
  Filled 2020-04-20 (×3): qty 1

## 2020-04-20 MED ORDER — LORAZEPAM 2 MG/ML IJ SOLN: 0.0000 mg | Freq: Two times a day (BID) | INTRAMUSCULAR | Status: DC

## 2020-04-20 MED ORDER — LORAZEPAM 2 MG/ML IJ SOLN: 1.0000 mg | INTRAMUSCULAR | Status: DC | PRN

## 2020-04-20 MED ORDER — APIXABAN 5 MG PO TABS
5.0000 mg | ORAL_TABLET | Freq: Two times a day (BID) | ORAL | Status: DC
  Administered 2020-04-21 – 2020-04-23 (×5): 5 mg via ORAL
  Filled 2020-04-20 (×7): qty 1

## 2020-04-20 MED ORDER — ACETAMINOPHEN 325 MG PO TABS: 650.0000 mg | ORAL_TABLET | ORAL | Status: DC | PRN

## 2020-04-20 MED ORDER — ACETAMINOPHEN 650 MG RE SUPP: 650.0000 mg | RECTAL | Status: DC | PRN

## 2020-04-20 MED ORDER — ADULT MULTIVITAMIN W/MINERALS CH
1.0000 | ORAL_TABLET | Freq: Every day | ORAL | Status: DC
  Administered 2020-04-20 – 2020-04-23 (×4): 1 via ORAL
  Filled 2020-04-20 (×4): qty 1

## 2020-04-20 MED ORDER — LEVETIRACETAM 500 MG PO TABS
500.0000 mg | ORAL_TABLET | Freq: Two times a day (BID) | ORAL | Status: DC
  Administered 2020-04-20 – 2020-04-23 (×6): 500 mg via ORAL
  Filled 2020-04-20 (×6): qty 1

## 2020-04-20 MED ORDER — SODIUM CHLORIDE 0.9 % IV SOLN
75.0000 mL/h | INTRAVENOUS | Status: DC
  Administered 2020-04-20: 75 mL/h via INTRAVENOUS

## 2020-04-20 MED ORDER — HYDRALAZINE HCL 20 MG/ML IJ SOLN
20.0000 mg | INTRAMUSCULAR | Status: DC | PRN
  Administered 2020-04-23: 20 mg via INTRAVENOUS
  Filled 2020-04-20 (×2): qty 1

## 2020-04-20 MED ORDER — ATORVASTATIN CALCIUM 10 MG PO TABS
20.0000 mg | ORAL_TABLET | Freq: Every day | ORAL | Status: DC
  Administered 2020-04-20 – 2020-04-23 (×4): 20 mg via ORAL
  Filled 2020-04-20 (×4): qty 2

## 2020-04-20 MED ORDER — LORAZEPAM 1 MG PO TABS
1.0000 mg | ORAL_TABLET | ORAL | Status: DC | PRN
  Administered 2020-04-23: 1 mg via ORAL
  Filled 2020-04-20: qty 1

## 2020-04-20 MED ORDER — INSULIN ASPART 100 UNIT/ML ~~LOC~~ SOLN
0.0000 [IU] | Freq: Three times a day (TID) | SUBCUTANEOUS | Status: DC
  Administered 2020-04-22: 2 [IU] via SUBCUTANEOUS

## 2020-04-20 MED ORDER — FOLIC ACID 1 MG PO TABS
1.0000 mg | ORAL_TABLET | Freq: Every day | ORAL | Status: DC
  Administered 2020-04-20 – 2020-04-23 (×4): 1 mg via ORAL
  Filled 2020-04-20 (×4): qty 1

## 2020-04-20 MED ORDER — INSULIN ASPART 100 UNIT/ML ~~LOC~~ SOLN: 0.0000 [IU] | Freq: Every day | SUBCUTANEOUS | Status: DC

## 2020-04-20 MED ORDER — THIAMINE HCL 100 MG PO TABS
100.0000 mg | ORAL_TABLET | Freq: Every day | ORAL | Status: DC
  Administered 2020-04-20 – 2020-04-23 (×4): 100 mg via ORAL
  Filled 2020-04-20 (×4): qty 1

## 2020-04-20 MED ORDER — LEVETIRACETAM IN NACL 1000 MG/100ML IV SOLN
1000.0000 mg | Freq: Once | INTRAVENOUS | Status: AC
  Administered 2020-04-20: 1000 mg via INTRAVENOUS
  Filled 2020-04-20: qty 100

## 2020-04-20 MED ORDER — LORAZEPAM 2 MG/ML IJ SOLN
0.0000 mg | Freq: Four times a day (QID) | INTRAMUSCULAR | Status: DC
  Administered 2020-04-22: 2 mg via INTRAVENOUS
  Filled 2020-04-20: qty 1

## 2020-04-20 MED ORDER — ENSURE ENLIVE PO LIQD: 237.0000 mL | Freq: Two times a day (BID) | ORAL | Status: DC

## 2020-04-20 MED ORDER — PANTOPRAZOLE SODIUM 40 MG PO TBEC
40.0000 mg | DELAYED_RELEASE_TABLET | Freq: Every day | ORAL | Status: DC
  Administered 2020-04-20 – 2020-04-22 (×3): 40 mg via ORAL
  Filled 2020-04-20 (×3): qty 1

## 2020-04-20 NOTE — ED Provider Notes (Signed)
[facility] EMERGENCY DEPARTMENT Provider Note   CSN: [id number] Arrival date & time: 04/20/20  1522     History Chief Complaint  Patient presents with  . Seizures    Blake [name] is a 79 y.o. male.  79 year old male with prior medical history as detailed below presents for evaluation of possible seizure activity.  Patient with reported seizure activity at home.  EMS reports that he was postictal on their initial evaluation.  Patient had a continued seizure activity with EMS transport.  EMS witnessed generalized tonic-clonic seizure activity that lasted approximately 5 minutes.  This resolved with administration of 5 mg of Versed during transport.  Upon arrival to the ED the patient is postictal and sedated.  He is protecting his airway.  Additional history is unable to be obtained secondary to his postictal state.  Level 5 caveat secondary to same.  Chart review reveals evidence of prior history of seizures.  Patient appears to be prescribed Keppra.  Patient also has a longstanding history of alcohol abuse and alcohol withdrawal.  The history is provided by the patient and the EMS personnel.  Seizures Seizure activity on arrival: no   Seizure type:  Grand mal Initial focality:  Unable to specify Return to baseline: no   Severity:  Moderate Duration:  5 minutes Timing:  Clustered Progression:  Unchanged      Past Medical History:  Diagnosis Date  . Chronic insomnia   . Cigarette smoker   . COPD (chronic obstructive pulmonary disease) ([place])   . Diverticulosis of colon   . Elevated prostate specific antigen (PSA)   . ETOH abuse   . GERD (gastroesophageal reflux disease)   . Hx of colonic polyps   . Hyperlipidemia   . Hypertension   . Pulmonary nodule   . Shortness of breath   . Stroke [facility])     Patient Active Problem List   Diagnosis Date Noted  . Leukocytosis 03/13/2020  . Stroke-like symptoms s/p tPA - ? TIA vs aborted stroke vs seizure  03/06/2020  . Acute CVA (cerebrovascular accident) ([place]) 01/10/2019  . Esophageal stricture   . Abnormal finding on GI tract imaging   . Dysphagia 07/20/2018  . Malnutrition of moderate degree 07/10/2018  . Aspiration pneumonia ([place]) 07/07/2018  . Seizure disorder ([place]) 07/07/2018  . HLD (hyperlipidemia) 07/07/2018  . Acute hypoxemic respiratory failure ([place]) 07/07/2018  . Encounter for intubation   . Acute respiratory failure with hypercapnia ([place])   . HCAP (healthcare-associated pneumonia)   . Protein-calorie malnutrition, severe 06/26/2018  . Alcohol withdrawal seizure ([place]) 06/25/2018  . New onset seizure ([place]) 01/07/2018  . Hypertension 01/07/2018  . GERD (gastroesophageal reflux disease) 01/07/2018  . Hyperlipidemia 01/07/2018  . Cigarette smoker 01/07/2018  . Alcohol abuse 01/07/2018  . Acute alcohol abuse, with delirium ([place])   . Acute encephalopathy 10/14/2014  . Unsteady gait 10/14/2014  . Alcohol withdrawal delirium ([place]) 10/14/2014  . Lipoma of neck 12/22/2012  . Cerebral atrophy ([place]) 09/28/2012  . Cerebrovascular small vessel disease 09/28/2012  . Left humeral fracture 07/04/2012  . Delirium 07/04/2012  . ETOH abuse   . ELEVATED PROSTATE SPECIFIC ANTIGEN 09/30/2010  . COPD (chronic obstructive pulmonary disease) ([place]) 01/28/2009  . PULMONARY NODULE 01/28/2009  . INSOMNIA, CHRONIC 10/15/2007  . COLONIC POLYPS 10/10/2007  . Type 2 diabetes mellitus ([place]) 10/10/2007  . Dyslipidemia 10/10/2007  . Essential hypertension 10/10/2007  . GERD 10/10/2007  . DIVERTICULOSIS OF COLON 10/10/2007    Past Surgical History:  Procedure Laterality Date  . COLONOSCOPY    . ESOPHAGOGASTRODUODENOSCOPY (EGD) WITH PROPOFOL N/A 07/21/2018   Procedure: ESOPHAGOGASTRODUODENOSCOPY (EGD) WITH PROPOFOL;  Surgeon: Lavena Bullion, DO;  Location: [facility];  Service: Gastroenterology;  Laterality: N/A;  . needle biopsy RUL nodule  01/2009   benign  . TONSILLECTOMY  1947  .  VASECTOMY         Family History  Problem Relation Age of Onset  . Diabetes Father   . Lung cancer Brother   . Colon cancer Neg Hx   . Esophageal cancer Neg Hx   . Stomach cancer Neg Hx     Social History   Tobacco Use  . Smoking status: Current Every Day Smoker    Packs/day: 1.00    Years: 55.00    Pack years: 55.00    Types: Cigarettes  . Smokeless tobacco: Never Used  . Tobacco comment: not smoking @ SNF  Substance Use Topics  . Alcohol use: Yes    Alcohol/week: 24.0 standard drinks    Types: 24 Cans of beer per week    Comment: 3-4 daily  . Drug use: No    Home Medications Prior to Admission medications   Medication Sig Start Date End Date Taking? Authorizing Provider  apixaban (ELIQUIS) 5 MG TABS tablet Take 1 tablet (5 mg total) by mouth 2 (two) times daily. 03/14/20   Rinehuls, Early Chars, PA-C  atorvastatin (LIPITOR) 20 MG tablet Take 1 tablet (20 mg total) by mouth daily. Patient not taking: Reported on 03/07/2020 01/13/19 01/13/20  Roxan Hockey, MD  Cholecalciferol (VITAMIN D-3) 25 MCG (1000 UT) CAPS Take 1 capsule (1,000 Units total) by mouth daily. Patient not taking: Reported on 03/07/2020 08/11/18   Medina-Vargas, Monina C, NP  feeding supplement, ENSURE ENLIVE, (ENSURE ENLIVE) LIQD Take 237 mLs by mouth 2 (two) times daily between meals. 03/15/20   Rinehuls, Early Chars, PA-C  folic acid (FOLVITE) 1 MG tablet Take 1 tablet (1 mg total) by mouth daily. Patient not taking: Reported on 03/07/2020 01/13/19   Roxan Hockey, MD  food thickener (THICK IT) POWD Add to liquids to create a nectar thick consistency. 03/14/20   Rinehuls, David L, PA-C  hydrALAZINE (APRESOLINE) 20 MG/ML injection Inject 1 mL (20 mg total) into the vein every 4 (four) hours as needed (>180). 03/14/20   Rinehuls, Early Chars, PA-C  ipratropium-albuterol (DUONEB) 0.5-2.5 (3) MG/3ML SOLN Take 3 mLs by nebulization See admin instructions. Nebulize and inhale 3 ml's into the lungs at 6 AM, 2 PM, and 10 PM  for 5 days with a start date of 07/06/18 AND 3 ml's every six hours as needed for coughing Patient not taking: Reported on 03/07/2020 08/11/18   Medina-Vargas, Monina C, NP  levETIRAcetam (KEPPRA) 500 MG tablet Take 1 tablet (500 mg total) by mouth 2 (two) times daily. Patient not taking: Reported on 03/07/2020 08/11/18   Medina-Vargas, Monina C, NP  Mouthwashes (MOUTH RINSE) LIQD solution 15 mLs by Mouth Rinse route 2 (two) times daily. 03/14/20   Rinehuls, Early Chars, PA-C  Multiple Vitamin (MULTIVITAMIN WITH MINERALS) TABS tablet Take 1 tablet by mouth daily. Patient not taking: Reported on 03/07/2020 01/14/19   Roxan Hockey, MD  pantoprazole (PROTONIX) 40 MG tablet Take 1 tablet (40 mg total) by mouth at bedtime. 03/14/20   Rinehuls, Early Chars, PA-C  thiamine 100 MG tablet Take 1 tablet (100 mg total) by mouth daily. Patient not taking: Reported on 03/07/2020 08/11/18   Medina-Vargas, Senaida Lange, NP  Allergies    Patient has no known allergies.  Review of Systems   Review of Systems  Neurological: Positive for seizures.  All other systems reviewed and are negative.   Physical Exam Updated Vital Signs BP (!) 177/89   Pulse 80   Temp (!) 96.8 F (36 C) (Rectal)   Resp 15   SpO2 97%   Physical Exam Vitals and nursing note reviewed.  Constitutional:      General: He is not in acute distress.    Appearance: He is well-developed.     Comments: Appears postictal.  Airway is protected.  Patient does withdraw to painful stimulation in all 4 extremities.  HENT:     Head: Normocephalic and atraumatic.  Eyes:     Conjunctiva/sclera: Conjunctivae normal.     Pupils: Pupils are equal, round, and reactive to light.  Cardiovascular:     Rate and Rhythm: Normal rate and regular rhythm.     Heart sounds: Normal heart sounds.  Pulmonary:     Effort: Pulmonary effort is normal. No respiratory distress.     Breath sounds: Normal breath sounds.  Abdominal:     General: There is no distension.       Palpations: Abdomen is soft.     Tenderness: There is no abdominal tenderness.  Musculoskeletal:        General: No deformity. Normal range of motion.     Cervical back: Normal range of motion and neck supple.  Skin:    General: Skin is warm and dry.  Neurological:     General: No focal deficit present.     Comments: Appears postictal     ED Results / Procedures / Treatments   Labs (all labs ordered are listed, but only abnormal results are displayed) Labs Reviewed  ACETAMINOPHEN LEVEL - Abnormal; Notable for the following components:      Result Value   Acetaminophen (Tylenol), Serum <10 (*)    All other components within normal limits  COMPREHENSIVE METABOLIC PANEL - Abnormal; Notable for the following components:   CO2 20 (*)    Glucose, Bld 134 (*)    Creatinine, Ser 1.74 (*)    Calcium 8.7 (*)    Total Protein 6.4 (*)    GFR calc non Af Amer 37 (*)    GFR calc Af Amer 43 (*)    All other components within normal limits  SALICYLATE LEVEL - Abnormal; Notable for the following components:   Salicylate Lvl <1.6 (*)    All other components within normal limits  CBC WITH DIFFERENTIAL/PLATELET - Abnormal; Notable for the following components:   WBC 12.6 (*)    RBC 3.80 (*)    Hemoglobin 11.9 (*)    HCT 37.0 (*)    Neutro Abs 10.2 (*)    Abs Immature Granulocytes 0.10 (*)    All other components within normal limits  I-STAT ARTERIAL BLOOD GAS, ED - Abnormal; Notable for the following components:   pH, Arterial 7.206 (*)    pCO2 arterial 52.7 (*)    Acid-base deficit 7.0 (*)    HCT 34.0 (*)    Hemoglobin 11.6 (*)    All other components within normal limits  I-STAT CHEM 8, ED - Abnormal; Notable for the following components:   Creatinine, Ser 1.60 (*)    Glucose, Bld 130 (*)    TCO2 20 (*)    Hemoglobin 12.2 (*)    HCT 36.0 (*)    All other components within normal limits  TROPONIN I (HIGH SENSITIVITY) - Abnormal; Notable for the following components:    Troponin I (High Sensitivity) 18 (*)    All other components within normal limits  ETHANOL  PROTIME-INR  TROPONIN I (HIGH SENSITIVITY)    EKG EKG Interpretation  Date/Time:  Sunday April 20 2020 15:45:57 EDT Ventricular Rate:  83 PR Interval:    QRS Duration: 112 QT Interval:  403 QTC Calculation: 474 R Axis:   -59 Text Interpretation: Accelerated junctional rhythm Left anterior fascicular block Probable anteroseptal infarct, old Confirmed by Dene Gentry [phone]) on 04/20/2020 3:54:16 PM   Radiology CT Head Wo Contrast  Result Date: 04/20/2020 CLINICAL DATA:  79 year old male with history of seizure and abnormal neurologic examination. EXAM: CT HEAD WITHOUT CONTRAST TECHNIQUE: Contiguous axial images were obtained from the base of the skull through the vertex without intravenous contrast. COMPARISON:  Head CT 03/06/2020. FINDINGS: Brain: Moderate cerebral atrophy. Patchy and confluent areas of decreased attenuation are noted throughout the deep and periventricular white matter of the cerebral hemispheres bilaterally, compatible with chronic microvascular ischemic disease. No evidence of acute infarction, hemorrhage, hydrocephalus, extra-axial collection or mass lesion/mass effect. Vascular: No hyperdense vessel or unexpected calcification. Skull: Normal. Negative for fracture or focal lesion. Sinuses/Orbits: No acute finding. Other: None. IMPRESSION: 1. No acute intracranial abnormalities. 2. Moderate cerebral atrophy with extensive chronic microvascular ischemic changes in the cerebral white matter, as above. Electronically Signed   By: Vinnie Langton M.D.   On: 04/20/2020 18:13   DG Chest Port 1 View  Result Date: 04/20/2020 CLINICAL DATA:  Post seizure, possible aspiration EXAM: PORTABLE CHEST 1 VIEW COMPARISON:  03/06/2020 FINDINGS: The heart size and mediastinal contours are within normal limits. Both lungs are clear. The visualized skeletal structures are unremarkable. IMPRESSION:  No acute abnormality of the lungs in AP portable projection. Electronically Signed   By: Eddie Candle M.D.   On: 04/20/2020 16:11    Procedures Procedures (including critical care time)  Medications Ordered in ED Medications  levETIRAcetam (KEPPRA) IVPB 1000 mg/100 mL premix (0 mg Intravenous Stopped 04/20/20 1747)    ED Course  I have reviewed the triage vital signs and the nursing notes.  Pertinent labs & imaging results that were available during my care of the patient were reviewed by me and considered in my medical decision making (see chart for details).    MDM Rules/Calculators/A&P                          MDM  Screen complete  Blake [name] was evaluated in Emergency Department on 04/20/2020 for the symptoms described in the history of present illness. He was evaluated in the context of the global COVID-19 pandemic, which necessitated consideration that the patient might be at risk for infection with the SARS-CoV-2 virus that causes COVID-19. Institutional protocols and algorithms that pertain to the evaluation of patients at risk for COVID-19 are in a state of rapid change based on information released by regulatory bodies including the CDC and federal and state organizations. These policies and algorithms were followed during the patient's care in the ED.  Patient is presenting immediately post reports seizure activity.  Patient initially appears to be postictal.  Patient was given 5 mg of Versed in route to the ED.  Patient was given additional 1 g of Keppra on arrival.  No additional seizure activity noted in the ED.  Following a period of observation/work-up the patient's mental status has still not  returned to normal.  Patient is arousable but somnolent.  The patient's POA, who is at bedside, feels that he is not back to his normal mental state.  Patient will benefit from admission for further work-up and observation.  Hospitalist service is aware of case will  evaluate for admission.  Final Clinical Impression(s) / ED Diagnoses Final diagnoses:  Seizure [facility])    Rx / DC Orders ED Discharge Orders    None       Valarie Merino, MD 04/20/20 1906

## 2020-04-20 NOTE — ED Triage Notes (Signed)
PT arrived by [facility] from home -reports 2 episodes of seizure. Per EMS on arrival to the home, they witnessed a "full body" seizure lasting about 64mins, unresponsive, posturing. -he was given 5 of versed -family reports that he was just released from a "rehab" (they could not say, what kind of rehab) -Family also reports that patient has been drinking a lot more.

## 2020-04-20 NOTE — H&P (Signed)
History and Physical    Blake [name] KGM:[id number] DOB: 07/23/1941 DOA: 04/20/2020  PCP: Patient, No Pcp Per (Confirm with patient/family/NH records and if not entered, this has to be entered at [facility] point of entry) Patient coming from: home  I have personally briefly reviewed patient's old medical records in [place]  Chief Complaint: seizure  HPI: Blake [name] is a 79 y.o. male with medical history significant of EtOH abuse, COPD, HTN, HLD, recent admission 03/06/20 for ?CVA vs Sz. SLP consult and swallow study showed a degree of esophageal dysphagia and a dyphagia II diet with thin liquids was recommended. Was d/c'd on Keppra and Eliquis for a. Fib. By his own report he has not been taking Keppra or Eliquis or other medications. During his last admission EEG was negative for seizure activity. Patient was a heavy drinker.  Today at home he had a witnessed seizure with post-ictal period by his caretaker. EMS was called and he did have a witnessed seizure on the way to [facility].   ED Course: Afebrile, BP elevated at 163/96. On arrival he was administered 1g Keppra. Labs were unremarkable except for Glucose of 130. CT head was negative. CXR clear. Patient remained somewhat confused: per his friend and POA he was not back to his baseline. TRH called to admit for further management.  Review of Systems: As per HPI otherwise 10 point review of systems negative.    Past Medical History:  Diagnosis Date  . Chronic insomnia   . Cigarette smoker   . COPD (chronic obstructive pulmonary disease) ([place])   . Diverticulosis of colon   . Elevated prostate specific antigen (PSA)   . ETOH abuse   . GERD (gastroesophageal reflux disease)   . Hx of colonic polyps   . Hyperlipidemia   . Hypertension   . Pulmonary nodule   . Shortness of breath   . Stroke [facility])     Past Surgical History:  Procedure Laterality Date  . COLONOSCOPY    . ESOPHAGOGASTRODUODENOSCOPY (EGD) WITH PROPOFOL N/A  07/21/2018   Procedure: ESOPHAGOGASTRODUODENOSCOPY (EGD) WITH PROPOFOL;  Surgeon: Lavena Bullion, DO;  Location: [place];  Service: Gastroenterology;  Laterality: N/A;  . needle biopsy RUL nodule  01/2009   benign  . TONSILLECTOMY  1947  . VASECTOMY     Soc Hx- married for 42 years then widowed. He had one son who a soldier. His son had TBI after MVA and was residing in a group home where he subsequently died. 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 He was living alone. He did spend some time in a SNF. He is currently living with his friend and his wife. He states he drinks 2 6 packs/day plus a pint of spirits. His last drink was 24 hrs ago. His friend reports he only drinks beer now - about 4 a day.    reports that he has been smoking cigarettes. He has a 55.00 pack-year smoking history. He has never used smokeless tobacco. He reports current alcohol use of about 24.0 standard drinks of alcohol per week. He reports that he does not use drugs.  No Known Allergies  Family History  Problem Relation Age of Onset  . Diabetes Father   . Lung cancer Brother   . Colon cancer Neg Hx   . Esophageal cancer Neg Hx   . Stomach cancer Neg Hx      Prior to Admission medications   Medication Sig Start Date End Date  Taking? Authorizing Provider  apixaban (ELIQUIS) 5 MG TABS tablet Take 1 tablet (5 mg total) by mouth 2 (two) times daily. 03/14/20   Rinehuls, Early Chars, PA-C  atorvastatin (LIPITOR) 20 MG tablet Take 1 tablet (20 mg total) by mouth daily. Patient not taking: Reported on 03/07/2020 01/13/19 01/13/20  Roxan Hockey, MD  Cholecalciferol (VITAMIN D-3) 25 MCG (1000 UT) CAPS Take 1 capsule (1,000 Units total) by mouth daily. Patient not taking: Reported on 03/07/2020 08/11/18   Medina-Vargas, Monina C, NP  feeding supplement, ENSURE ENLIVE, (ENSURE ENLIVE) LIQD Take 237 mLs by mouth 2 (two) times daily between meals. 03/15/20   Rinehuls, Early Chars, PA-C  folic acid (FOLVITE)  1 MG tablet Take 1 tablet (1 mg total) by mouth daily. Patient not taking: Reported on 03/07/2020 01/13/19   Roxan Hockey, MD  food thickener (THICK IT) POWD Add to liquids to create a nectar thick consistency. 03/14/20   Rinehuls, David L, PA-C  hydrALAZINE (APRESOLINE) 20 MG/ML injection Inject 1 mL (20 mg total) into the vein every 4 (four) hours as needed (>180). 03/14/20   Rinehuls, Early Chars, PA-C  ipratropium-albuterol (DUONEB) 0.5-2.5 (3) MG/3ML SOLN Take 3 mLs by nebulization See admin instructions. Nebulize and inhale 3 ml's into the lungs at 6 AM, 2 PM, and 10 PM for 5 days with a start date of 07/06/18 AND 3 ml's every six hours as needed for coughing Patient not taking: Reported on 03/07/2020 08/11/18   Medina-Vargas, Monina C, NP  levETIRAcetam (KEPPRA) 500 MG tablet Take 1 tablet (500 mg total) by mouth 2 (two) times daily. Patient not taking: Reported on 03/07/2020 08/11/18   Medina-Vargas, Monina C, NP  Mouthwashes (MOUTH RINSE) LIQD solution 15 mLs by Mouth Rinse route 2 (two) times daily. 03/14/20   Rinehuls, Early Chars, PA-C  Multiple Vitamin (MULTIVITAMIN WITH MINERALS) TABS tablet Take 1 tablet by mouth daily. Patient not taking: Reported on 03/07/2020 01/14/19   Roxan Hockey, MD  pantoprazole (PROTONIX) 40 MG tablet Take 1 tablet (40 mg total) by mouth at bedtime. 03/14/20   Rinehuls, Early Chars, PA-C  thiamine 100 MG tablet Take 1 tablet (100 mg total) by mouth daily. Patient not taking: Reported on 03/07/2020 08/11/18   Nickola Major, NP    Physical Exam: Vitals:   04/20/20 1918 04/20/20 1919 04/20/20 1920 04/20/20 1932  BP:    (!) 163/96  Pulse: 78 78 81 77  Resp: 19 19 16 16   Temp:      TempSrc:      SpO2: 99% 98% 99% 99%    Constitutional: NAD, calm, comfortable Vitals:   04/20/20 1918 04/20/20 1919 04/20/20 1920 04/20/20 1932  BP:    (!) 163/96  Pulse: 78 78 81 77  Resp: 19 19 16 16   Temp:      TempSrc:      SpO2: 99% 98% 99% 99%   General:  Somewhat  ill-kempt elderl man in no distress. Now conversant. Eyes: [place] OD due to blunt trauma per patient with significant loss of vision, OS pupil normal, lids and conjunctivae normal ENMT: Mucous membranes are moist.. Neck: normal, supple, no masses, no thyromegaly Respiratory: clear to auscultation bilaterally, no wheezing, no crackles. Normal respiratory effort. No accessory muscle use.  Cardiovascular: Regular rate and rhythm, no murmurs / rubs / gallops. No extremity edema. 1+ pedal pulses. No carotid bruits.  Abdomen: no tenderness, no masses palpated. No hepatosplenomegaly. Bowel sounds positive.  Musculoskeletal: no clubbing / cyanosis. No joint deformity upper  and lower extremities. Good ROM, no contractures. Normal muscle tone.  Skin: no rashes, lesions, ulcers. No induration. Torn toe nail 5th digit right. Multiple bruises. Neurologic: CN 2-12 grossly intact. Sensation intact  Strength 5/5 in all 4.  Psychiatric: Normal judgment and insight. Awake and  oriented x 3. Normal mood but a little grumpy.     Labs on Admission: I have personally reviewed following labs and imaging studies  CBC: Recent Labs  Lab 04/20/20 1534 04/20/20 1543 04/20/20 1549  WBC  --  12.6*  --   NEUTROABS  --  10.2*  --   HGB 11.6* 11.9* 12.2*  HCT 34.0* 37.0* 36.0*  MCV  --  97.4  --   PLT  --  187  --    Basic Metabolic Panel: Recent Labs  Lab 04/20/20 1534 04/20/20 1543 04/20/20 1549  NA 141 139 141  K 4.5 4.5 4.4  CL  --  108 107  CO2  --  20*  --   GLUCOSE  --  134* 130*  BUN  --  18 20  CREATININE  --  1.74* 1.60*  CALCIUM  --  8.7*  --    GFR: CrCl cannot be calculated (Unknown ideal weight.). Liver Function Tests: Recent Labs  Lab 04/20/20 1543  AST 20  ALT 10  ALKPHOS 87  BILITOT 0.4  PROT 6.4*  ALBUMIN 3.5   No results for input(s): LIPASE, AMYLASE in the last 168 hours. No results for input(s): AMMONIA in the last 168 hours. Coagulation Profile: Recent Labs  Lab  04/20/20 1543  INR 1.1   Cardiac Enzymes: No results for input(s): CKTOTAL, CKMB, CKMBINDEX, TROPONINI in the last 168 hours. BNP (last 3 results) No results for input(s): PROBNP in the last 8760 hours. HbA1C: No results for input(s): HGBA1C in the last 72 hours. CBG: No results for input(s): GLUCAP in the last 168 hours. Lipid Profile: No results for input(s): CHOL, HDL, LDLCALC, TRIG, CHOLHDL, LDLDIRECT in the last 72 hours. Thyroid Function Tests: No results for input(s): TSH, T4TOTAL, FREET4, T3FREE, THYROIDAB in the last 72 hours. Anemia Panel: No results for input(s): VITAMINB12, FOLATE, FERRITIN, TIBC, IRON, RETICCTPCT in the last 72 hours. Urine analysis:    Component Value Date/Time   COLORURINE YELLOW 03/09/2020 1437   APPEARANCEUR HAZY (A) 03/09/2020 1437   LABSPEC 1.015 03/09/2020 1437   PHURINE 5.0 03/09/2020 1437   GLUCOSEU NEGATIVE 03/09/2020 1437   GLUCOSEU NEGATIVE 02/02/2011 1543   HGBUR LARGE (A) 03/09/2020 1437   BILIRUBINUR NEGATIVE 03/09/2020 1437   KETONESUR NEGATIVE 03/09/2020 1437   PROTEINUR 30 (A) 03/09/2020 1437   UROBILINOGEN 1.0 10/14/2014 1211   NITRITE NEGATIVE 03/09/2020 1437   LEUKOCYTESUR NEGATIVE 03/09/2020 1437    Radiological Exams on Admission: CT Head Wo Contrast  Result Date: 04/20/2020 CLINICAL DATA:  79 year old male with history of seizure and abnormal neurologic examination. EXAM: CT HEAD WITHOUT CONTRAST TECHNIQUE: Contiguous axial images were obtained from the base of the skull through the vertex without intravenous contrast. COMPARISON:  Head CT 03/06/2020. FINDINGS: Brain: Moderate cerebral atrophy. Patchy and confluent areas of decreased attenuation are noted throughout the deep and periventricular white matter of the cerebral hemispheres bilaterally, compatible with chronic microvascular ischemic disease. No evidence of acute infarction, hemorrhage, hydrocephalus, extra-axial collection or mass lesion/mass effect. Vascular: No  hyperdense vessel or unexpected calcification. Skull: Normal. Negative for fracture or focal lesion. Sinuses/Orbits: No acute finding. Other: None. IMPRESSION: 1. No acute intracranial abnormalities. 2. Moderate cerebral atrophy  with extensive chronic microvascular ischemic changes in the cerebral white matter, as above. Electronically Signed   By: Vinnie Langton M.D.   On: 04/20/2020 18:13   DG Chest Port 1 View  Result Date: 04/20/2020 CLINICAL DATA:  Post seizure, possible aspiration EXAM: PORTABLE CHEST 1 VIEW COMPARISON:  03/06/2020 FINDINGS: The heart size and mediastinal contours are within normal limits. Both lungs are clear. The visualized skeletal structures are unremarkable. IMPRESSION: No acute abnormality of the lungs in AP portable projection. Electronically Signed   By: Eddie Candle M.D.   On: 04/20/2020 16:11    EKG: Independently reviewed. Junctional rhythm, LAFB, no acute injury  Assessment/Plan Active Problems:   Seizure disorder (HCC)   Type 2 diabetes mellitus ([place])   Essential hypertension   ETOH abuse   Dysphagia   Dyslipidemia   Seizure ([place])     1. Seizure - full work up in June '21 with no seizure focus on EEG. He may have had EtOH related seizure. He was non-adherent to discharge regimen - Keppra 500 mg BID. He had no outpatient follow up. He has continued to drink. He is not postictal at admission exam Plan Continue Keppra 500 mg BID  Med-surg observation  2. EtOH abuse - he admits that he is probably an alcoholic and stated that he knows AA may be of help. He has gone 24 hrs w/o a drink with usual consumption of 12 oz/day. He has had Alcohol withdrawal symptoms in the past. Plan Alcohol withdrawal protocol  3. Dysphagia - diagnosed at June '21 admission Plan Dysphagia II diet with thin liquids  4. HTN - no antihypertensive medication on med list Plan ACE-I  5. DM - by his record but on no medication Plan  Sliding scale coverage  6. Dispo - TOC  consult placed. Expect discharge to home in 24-48 hrs.   DVT prophylaxis: lovenox  Code Status: full code  Family Communication: spoke with Wolfgang Phoenix, friend with whom he lives. He is aware of Mr. [name]'s problems and is doing all he can to help. Disposition Plan: home when stable - 24-48 hrs (specify when and where you expect patient to be discharged) Consults called: none (with names) Admission status: observation    Adella Hare MD [facility] Pager [phone]  If 7PM-7AM, please contact night-coverage [email or web address] Password [facility]  04/20/2020, 8:27 PM

## 2020-04-20 NOTE — ED Notes (Addendum)
Family at bedside  Holten Spano wishes to be called when admit. md has seen pt. Family member wishes to use password for information:  Debo as password.

## 2020-04-21 DIAGNOSIS — I10 Essential (primary) hypertension: Secondary | ICD-10-CM | POA: Diagnosis not present

## 2020-04-21 DIAGNOSIS — R1310 Dysphagia, unspecified: Secondary | ICD-10-CM | POA: Diagnosis not present

## 2020-04-21 DIAGNOSIS — E785 Hyperlipidemia, unspecified: Secondary | ICD-10-CM | POA: Diagnosis not present

## 2020-04-21 DIAGNOSIS — F1010 Alcohol abuse, uncomplicated: Secondary | ICD-10-CM | POA: Diagnosis not present

## 2020-04-21 LAB — CBG MONITORING, ED
Glucose-Capillary: 123 mg/dL — ABNORMAL HIGH (ref 70–99)
Glucose-Capillary: 82 mg/dL (ref 70–99)
Glucose-Capillary: 83 mg/dL (ref 70–99)

## 2020-04-21 LAB — MAGNESIUM: Magnesium: 2.1 mg/dL (ref 1.7–2.4)

## 2020-04-21 LAB — PHOSPHORUS: Phosphorus: 3.7 mg/dL (ref 2.5–4.6)

## 2020-04-21 NOTE — ED Notes (Signed)
Pt continuously yelling at staff due to not having his denture, pt oriented that he didn't arrive to ED with dentures and we don't have access to them. Multiple attempts made to get in touch with emergency contacts on the chart with no answer to the phone call, we'll continue to try to contact any family member for this.

## 2020-04-21 NOTE — ED Notes (Signed)
Pharmacy notified about Eliquis.

## 2020-04-21 NOTE — ED Notes (Signed)
Lunch Tray Ordered @ 1027. °

## 2020-04-21 NOTE — ED Notes (Signed)
Family member to bring pt's denture and cell phone after he gets out of work, pt notified.

## 2020-04-21 NOTE — Progress Notes (Signed)
Received patient from the ED. Moved him from stretcher to the bed. Wiped patient down with warm CHG wipes, changed gown. VS stable. Gave patient a Coke to drink at his request. Patient is pleasantly grumpy. Will continue to monitor.

## 2020-04-21 NOTE — Progress Notes (Signed)
[facility]  PROGRESS NOTE  Blake [name] YQI:[id number] DOB: [birth year] DOA: 04/20/2020 PCP: Patient, No Pcp Per   Brief HPI:   79 year old male with medical history of alcohol abuse, COPD, hypertension, hyperlipidemia, recent admission on 03/06/2020 for questionable CVA versus seizure.  Speech therapy was consulted, swallow evaluation showed esophageal dysphagia, dysphagia 2 diet with thin liquids was recommended.  Patient was discharged on Keppra and Eliquis.  Patient has not been taking Keppra Eliquis or other medications.  During last admission EEG was negative for seizure activity. Patient was brought to the ED as he had witnessed seizure, with post ictal.  By his caretaker.  EMS was called and patient again had witnessed seizure on the way to to [facility]. Patient started on Keppra.  Subjective   Patient seen and examined, denies any complaints.  No seizures overnight.   Assessment/Plan:     1. Seizure-likely alcohol withdrawal.  He had a full work-up in June 2021 at that time EEG showed no seizure.  Patient has not been compliant with his Keppra.  Patient continues to drink alcohol.  Keppra has been restarted.  Seizure precautions. 2. Alcohol abuse-patient drinks alcohol, he has gone 24 hours without drinking.  Will consumption of 12 ounce per day.  Started on alcohol withdrawal protocol 3. Dysphagia-diagnosed in June 2021 admission.  Continue dysphagia 2 diet. 4. Hypertension-continue lisinopril 2.5 mg daily. 5. Diabetes mellitus test type II-continue sliding scale insulin NovoLog. 6. CKD stage IIIb-creatinine is 1.60, at baseline.   Scheduled medications:   . apixaban  5 mg Oral BID  . atorvastatin  20 mg Oral Daily  . folic acid  1 mg Oral Daily  . insulin aspart  0-15 Units Subcutaneous TID WC  . insulin aspart  0-5 Units Subcutaneous QHS  . levETIRAcetam  500 mg Oral BID  . lisinopril  2.5 mg Oral Daily  . LORazepam  0-4 mg Intravenous Q6H   Followed by  .  [START ON 04/22/2020] LORazepam  0-4 mg Intravenous Q12H  . multivitamin with minerals  1 tablet Oral Daily  . pantoprazole  40 mg Oral QHS  . thiamine  100 mg Oral Daily         CBG: Recent Labs  Lab 04/20/20 2252 04/21/20 0752 04/21/20 1250  GLUCAP 80 123* 82    SpO2: 96 %    CBC: Recent Labs  Lab 04/20/20 1534 04/20/20 1543 04/20/20 1549  WBC  --  12.6*  --   NEUTROABS  --  10.2*  --   HGB 11.6* 11.9* 12.2*  HCT 34.0* 37.0* 36.0*  MCV  --  97.4  --   PLT  --  187  --     Basic Metabolic Panel: Recent Labs  Lab 04/20/20 1534 04/20/20 1543 04/20/20 1549 04/20/20 1819  NA 141 139 141  --   K 4.5 4.5 4.4  --   CL  --  108 107  --   CO2  --  20*  --   --   GLUCOSE  --  134* 130*  --   BUN  --  18 20  --   CREATININE  --  1.74* 1.60*  --   CALCIUM  --  8.7*  --   --   MG  --   --   --  2.1  PHOS  --   --   --  3.7     Liver Function Tests: Recent Labs  Lab 04/20/20 1543  AST  20  ALT 10  ALKPHOS 87  BILITOT 0.4  PROT 6.4*  ALBUMIN 3.5     Antibiotics: Anti-infectives (From admission, onward)   None       DVT prophylaxis: Apixaban  Code Status: Full code  Family Communication: No family at bedside    Status is: Inpatient  Dispo: The patient is from: Home              Anticipated d/c is to: Home              Anticipated d/c date is: 04/24/2020              Patient currently not medically stable for discharge.  Barrier to discharge-alcohol withdrawal           Consultants:    Procedures:     Objective   Vitals:   04/20/20 2130 04/21/20 0253 04/21/20 0312 04/21/20 1104  BP: (!) 182/157 (!) 174/77 (!) 174/77 (!) 134/71  Pulse: 81 76 76 62  Resp: 20 18  18   Temp:  99.3 F (37.4 C)  98.2 F (36.8 C)  TempSrc:  Oral  Oral  SpO2: 99% 100%  96%   No intake or output data in the 24 hours ending 04/21/20 1530  No intake/output data recorded.  There were no vitals filed for this visit.  Physical  Examination:    General: Appears in no acute distress  Cardiovascular: S1-S2, regular, no murmur auscultated  Respiratory: Clear to auscultation bilaterally  Abdomen: Soft, nontender, no organomegaly  Extremities: No edema in the lower extremities  Neurologic: Alert, oriented x 3, no focal deficit noted    Data Reviewed:   Recent Results (from the past 240 hour(s))  SARS Coronavirus 2 by RT PCR (hospital order, performed in [place] hospital lab) Nasopharyngeal Nasopharyngeal Swab     Status: None   Collection Time: 04/20/20  7:03 PM   Specimen: Nasopharyngeal Swab  Result Value Ref Range Status   SARS Coronavirus 2 NEGATIVE NEGATIVE Final    Comment: (NOTE) SARS-CoV-2 target nucleic acids are NOT DETECTED.  The SARS-CoV-2 RNA is generally detectable in upper and lower respiratory specimens during the acute phase of infection. The lowest concentration of SARS-CoV-2 viral copies this assay can detect is 250 copies / mL. A negative result does not preclude SARS-CoV-2 infection and should not be used as the sole basis for treatment or other patient management decisions.  A negative result may occur with improper specimen collection / handling, submission of specimen other than nasopharyngeal swab, presence of viral mutation(s) within the areas targeted by this assay, and inadequate number of viral copies (<250 copies / mL). A negative result must be combined with clinical observations, patient history, and epidemiological information.  Fact Sheet for Patients:   StrictlyIdeas.no  Fact Sheet for Healthcare Providers: BankingDealers.co.za  This test is not yet approved or  cleared by the [place] FDA and has been authorized for detection and/or diagnosis of SARS-CoV-2 by FDA under an Emergency Use Authorization (EUA).  This EUA will remain in effect (meaning this test can be used) for the duration of the COVID-19  declaration under Section 564(b)(1) of the Act, 21 U.S.C. section 360bbb-3(b)(1), unless the authorization is terminated or revoked sooner.  Performed at [facility], [place] [address], [place], [place] [address]      Studies:  CT Head Wo Contrast  Result Date: 04/20/2020 CLINICAL DATA:  79 year old male with history of seizure and abnormal neurologic examination. EXAM:  CT HEAD WITHOUT CONTRAST TECHNIQUE: Contiguous axial images were obtained from the base of the skull through the vertex without intravenous contrast. COMPARISON:  Head CT 03/06/2020. FINDINGS: Brain: Moderate cerebral atrophy. Patchy and confluent areas of decreased attenuation are noted throughout the deep and periventricular white matter of the cerebral hemispheres bilaterally, compatible with chronic microvascular ischemic disease. No evidence of acute infarction, hemorrhage, hydrocephalus, extra-axial collection or mass lesion/mass effect. Vascular: No hyperdense vessel or unexpected calcification. Skull: Normal. Negative for fracture or focal lesion. Sinuses/Orbits: No acute finding. Other: None. IMPRESSION: 1. No acute intracranial abnormalities. 2. Moderate cerebral atrophy with extensive chronic microvascular ischemic changes in the cerebral white matter, as above. Electronically Signed   By: Vinnie Langton M.D.   On: 04/20/2020 18:13   DG Chest Port 1 View  Result Date: 04/20/2020 CLINICAL DATA:  Post seizure, possible aspiration EXAM: PORTABLE CHEST 1 VIEW COMPARISON:  03/06/2020 FINDINGS: The heart size and mediastinal contours are within normal limits. Both lungs are clear. The visualized skeletal structures are unremarkable. IMPRESSION: No acute abnormality of the lungs in AP portable projection. Electronically Signed   By: Eddie Candle M.D.   On: 04/20/2020 16:11       [place]   [facility] If 7PM-7AM, please contact night-coverage at [email or web address], Office  [phone]   04/21/2020, 3:30  PM  LOS: 0 days

## 2020-04-21 NOTE — ED Notes (Signed)
Eliquis will be given once delivered by pharmacy.

## 2020-04-22 DIAGNOSIS — Z9114 Patient's other noncompliance with medication regimen: Secondary | ICD-10-CM | POA: Diagnosis not present

## 2020-04-22 DIAGNOSIS — R1310 Dysphagia, unspecified: Secondary | ICD-10-CM | POA: Diagnosis present

## 2020-04-22 DIAGNOSIS — K5730 Diverticulosis of large intestine without perforation or abscess without bleeding: Secondary | ICD-10-CM | POA: Diagnosis present

## 2020-04-22 DIAGNOSIS — F1010 Alcohol abuse, uncomplicated: Secondary | ICD-10-CM | POA: Diagnosis not present

## 2020-04-22 DIAGNOSIS — F10939 Alcohol use, unspecified with withdrawal, unspecified: Secondary | ICD-10-CM | POA: Diagnosis present

## 2020-04-22 DIAGNOSIS — I441 Atrioventricular block, second degree: Secondary | ICD-10-CM | POA: Diagnosis present

## 2020-04-22 DIAGNOSIS — Z833 Family history of diabetes mellitus: Secondary | ICD-10-CM | POA: Diagnosis not present

## 2020-04-22 DIAGNOSIS — E785 Hyperlipidemia, unspecified: Secondary | ICD-10-CM | POA: Diagnosis present

## 2020-04-22 DIAGNOSIS — Z7901 Long term (current) use of anticoagulants: Secondary | ICD-10-CM | POA: Diagnosis not present

## 2020-04-22 DIAGNOSIS — F5104 Psychophysiologic insomnia: Secondary | ICD-10-CM | POA: Diagnosis present

## 2020-04-22 DIAGNOSIS — F10232 Alcohol dependence with withdrawal with perceptual disturbance: Secondary | ICD-10-CM | POA: Diagnosis not present

## 2020-04-22 DIAGNOSIS — R569 Unspecified convulsions: Secondary | ICD-10-CM | POA: Diagnosis present

## 2020-04-22 DIAGNOSIS — Z8673 Personal history of transient ischemic attack (TIA), and cerebral infarction without residual deficits: Secondary | ICD-10-CM | POA: Diagnosis not present

## 2020-04-22 DIAGNOSIS — Z79899 Other long term (current) drug therapy: Secondary | ICD-10-CM | POA: Diagnosis not present

## 2020-04-22 DIAGNOSIS — I129 Hypertensive chronic kidney disease with stage 1 through stage 4 chronic kidney disease, or unspecified chronic kidney disease: Secondary | ICD-10-CM | POA: Diagnosis present

## 2020-04-22 DIAGNOSIS — Z8719 Personal history of other diseases of the digestive system: Secondary | ICD-10-CM | POA: Diagnosis not present

## 2020-04-22 DIAGNOSIS — G40909 Epilepsy, unspecified, not intractable, without status epilepticus: Secondary | ICD-10-CM | POA: Diagnosis not present

## 2020-04-22 DIAGNOSIS — I10 Essential (primary) hypertension: Secondary | ICD-10-CM | POA: Diagnosis not present

## 2020-04-22 DIAGNOSIS — E1122 Type 2 diabetes mellitus with diabetic chronic kidney disease: Secondary | ICD-10-CM | POA: Diagnosis present

## 2020-04-22 DIAGNOSIS — Z20822 Contact with and (suspected) exposure to covid-19: Secondary | ICD-10-CM | POA: Diagnosis present

## 2020-04-22 DIAGNOSIS — N1832 Chronic kidney disease, stage 3b: Secondary | ICD-10-CM | POA: Diagnosis present

## 2020-04-22 DIAGNOSIS — J9602 Acute respiratory failure with hypercapnia: Secondary | ICD-10-CM | POA: Diagnosis present

## 2020-04-22 DIAGNOSIS — Z794 Long term (current) use of insulin: Secondary | ICD-10-CM | POA: Diagnosis not present

## 2020-04-22 DIAGNOSIS — E872 Acidosis: Secondary | ICD-10-CM | POA: Diagnosis present

## 2020-04-22 DIAGNOSIS — J449 Chronic obstructive pulmonary disease, unspecified: Secondary | ICD-10-CM | POA: Diagnosis present

## 2020-04-22 DIAGNOSIS — K219 Gastro-esophageal reflux disease without esophagitis: Secondary | ICD-10-CM | POA: Diagnosis present

## 2020-04-22 DIAGNOSIS — I444 Left anterior fascicular block: Secondary | ICD-10-CM | POA: Diagnosis present

## 2020-04-22 DIAGNOSIS — Z801 Family history of malignant neoplasm of trachea, bronchus and lung: Secondary | ICD-10-CM | POA: Diagnosis not present

## 2020-04-22 DIAGNOSIS — F10239 Alcohol dependence with withdrawal, unspecified: Secondary | ICD-10-CM | POA: Diagnosis present

## 2020-04-22 LAB — BASIC METABOLIC PANEL
Anion gap: 7 (ref 5–15)
BUN: 21 mg/dL (ref 8–23)
CO2: 22 mmol/L (ref 22–32)
Calcium: 9 mg/dL (ref 8.9–10.3)
Chloride: 109 mmol/L (ref 98–111)
Creatinine, Ser: 1.8 mg/dL — ABNORMAL HIGH (ref 0.61–1.24)
GFR calc Af Amer: 41 mL/min — ABNORMAL LOW (ref >60)
GFR calc non Af Amer: 35 mL/min — ABNORMAL LOW (ref >60)
Glucose, Bld: 126 mg/dL — ABNORMAL HIGH (ref 70–99)
Potassium: 4.2 mmol/L (ref 3.5–5.1)
Sodium: 138 mmol/L (ref 135–145)

## 2020-04-22 LAB — CBC
HCT: 36.7 % — ABNORMAL LOW (ref 39.0–52.0)
Hemoglobin: 11.9 g/dL — ABNORMAL LOW (ref 13.0–17.0)
MCH: 31.6 pg (ref 26.0–34.0)
MCHC: 32.4 g/dL (ref 30.0–36.0)
MCV: 97.6 fL (ref 80.0–100.0)
Platelets: 187 10*3/uL (ref 150–400)
RBC: 3.76 MIL/uL — ABNORMAL LOW (ref 4.22–5.81)
RDW: 14.4 % (ref 11.5–15.5)
WBC: 8.1 10*3/uL (ref 4.0–10.5)
nRBC: 0 % (ref 0.0–0.2)

## 2020-04-22 LAB — GLUCOSE, CAPILLARY
Glucose-Capillary: 83 mg/dL (ref 70–99)
Glucose-Capillary: 136 mg/dL — ABNORMAL HIGH (ref 70–99)
Glucose-Capillary: 93 mg/dL (ref 70–99)

## 2020-04-22 NOTE — Progress Notes (Addendum)
[facility]  PROGRESS NOTE  OTIS PORTAL PPI:[id number] DOB: [birth year] DOA: 04/20/2020 PCP: Patient, No Pcp Per   Brief HPI:   79 year old male with medical history of alcohol abuse, COPD, hypertension, hyperlipidemia, recent admission on 03/06/2020 for questionable CVA versus seizure.  Speech therapy was consulted, swallow evaluation showed esophageal dysphagia, dysphagia 2 diet with thin liquids was recommended.  Patient was discharged on Keppra and Eliquis.  Patient has not been taking Keppra Eliquis or other medications.  During last admission EEG was negative for seizure activity. Patient was brought to the ED as he had witnessed seizure, with post ictal.  By his caretaker.  EMS was called and patient again had witnessed seizure on the way to to [facility] ED. Patient started on Keppra.  Subjective   Patient seen and examined, very somnolent this morning.  He has been getting scheduled Ativan 2 mg IV every 6 hours.  No further seizures in the hospital.   Assessment/Plan:     1. Seizure-likely alcohol withdrawal.  He had a full work-up in June 2021 at that time EEG showed no seizure.  Patient has not been compliant with his Keppra.  Patient continues to drink alcohol.  Keppra has been restarted.  Continue seizure precautions. 2. Alcohol abuse-patient drinks alcohol, he has gone 24 hours without drinking.  Consumption of 12 ounce per day.  Started on alcohol withdrawal protocol.  Will discontinue scheduled Ativan and continue with as needed Ativan for alcohol withdrawal. 3. Dysphagia-diagnosed in June 2021 admission.  Continue dysphagia 2 diet. 4. Hypertension-continue lisinopril 2.5 mg daily. 5. Diabetes mellitus test type II-continue sliding scale insulin NovoLog.  Blood glucose is  well controlled. 6. CKD stage IIIb-creatinine is 1.60, at baseline.   Scheduled medications:   . apixaban  5 mg Oral BID  . atorvastatin  20 mg Oral Daily  . folic acid  1 mg Oral Daily  .  insulin aspart  0-15 Units Subcutaneous TID WC  . insulin aspart  0-5 Units Subcutaneous QHS  . levETIRAcetam  500 mg Oral BID  . multivitamin with minerals  1 tablet Oral Daily  . pantoprazole  40 mg Oral QHS  . thiamine  100 mg Oral Daily         CBG: Recent Labs  Lab 04/20/20 2252 04/21/20 0752 04/21/20 1250 04/21/20 1717 04/22/20 1205  GLUCAP 80 123* 82 83 83    SpO2: 98 %    CBC: Recent Labs  Lab 04/20/20 1534 04/20/20 1543 04/20/20 1549 04/22/20 0435  WBC  --  12.6*  --  8.1  NEUTROABS  --  10.2*  --   --   HGB 11.6* 11.9* 12.2* 11.9*  HCT 34.0* 37.0* 36.0* 36.7*  MCV  --  97.4  --  97.6  PLT  --  187  --  063    Basic Metabolic Panel: Recent Labs  Lab 04/20/20 1534 04/20/20 1543 04/20/20 1549 04/20/20 1819 04/22/20 0435  NA 141 139 141  --  138  K 4.5 4.5 4.4  --  4.2  CL  --  108 107  --  109  CO2  --  20*  --   --  22  GLUCOSE  --  134* 130*  --  126*  BUN  --  18 20  --  21  CREATININE  --  1.74* 1.60*  --  1.80*  CALCIUM  --  8.7*  --   --  9.0  MG  --   --   --  2.1  --   PHOS  --   --   --  3.7  --      Liver Function Tests: Recent Labs  Lab 04/20/20 1543  AST 20  ALT 10  ALKPHOS 87  BILITOT 0.4  PROT 6.4*  ALBUMIN 3.5     Antibiotics: Anti-infectives (From admission, onward)   None       DVT prophylaxis: Apixaban  Code Status: Full code  Family Communication: No family at bedside    Status is: Inpatient  Dispo: The patient is from: Home              Anticipated d/c is to: Home              Anticipated d/c date is: 04/23/2020              Patient currently not medically stable for discharge.  Barrier to discharge-patient very somnolent today, will discontinue scheduled Ativan and continue with only as needed Ativan.  If more alert and no signs or symptoms of alcohol withdrawal.  He can be discharged in next 1 to 2 days.      Consultants:    Procedures:     Objective   Vitals:   04/21/20 2320  04/22/20 0400 04/22/20 0812 04/22/20 1206  BP: (!) 151/78 (!) 140/67 (!) 157/79 (!) 158/91  Pulse: 74 79 65 70  Resp: 18 18 20 18   Temp: 97.8 F (36.6 C) 98.3 F (36.8 C) 98.1 F (36.7 C) 98.2 F (36.8 C)  TempSrc: Oral Oral Oral Axillary  SpO2: 92% 97% 100% 98%    Intake/Output Summary (Last 24 hours) at 04/22/2020 1403 Last data filed at 04/22/2020 1352 Gross per 24 hour  Intake 1080 ml  Output 900 ml  Net 180 ml    07/25 1901 - 07/27 0700 In: 720 [P.O.:720] Out: -   There were no vitals filed for this visit.  Physical Examination:   General-appears in no acute distress Heart-S1-S2, regular, no murmur auscultated Lungs-clear to auscultation bilaterally, no wheezing or crackles auscultated Abdomen-soft, nontender, no organomegaly Extremities-no edema in the lower extremities Neuro-somnolent, but arousable, following commands.   Data Reviewed:   Recent Results (from the past 240 hour(s))  SARS Coronavirus 2 by RT PCR (hospital order, performed in [facility] hospital lab) Nasopharyngeal Nasopharyngeal Swab     Status: None   Collection Time: 04/20/20  7:03 PM   Specimen: Nasopharyngeal Swab  Result Value Ref Range Status   SARS Coronavirus 2 NEGATIVE NEGATIVE Final    Comment: (NOTE) SARS-CoV-2 target nucleic acids are NOT DETECTED.  The SARS-CoV-2 RNA is generally detectable in upper and lower respiratory specimens during the acute phase of infection. The lowest concentration of SARS-CoV-2 viral copies this assay can detect is 250 copies / mL. A negative result does not preclude SARS-CoV-2 infection and should not be used as the sole basis for treatment or other patient management decisions.  A negative result may occur with improper specimen collection / handling, submission of specimen other than nasopharyngeal swab, presence of viral mutation(s) within the areas targeted by this assay, and inadequate number of viral copies (<250 copies / mL). A negative  result must be combined with clinical observations, patient history, and epidemiological information.  Fact Sheet for Patients:   StrictlyIdeas.no  Fact Sheet for Healthcare Providers: BankingDealers.co.za  This test is not yet approved or  cleared by the [place] FDA and has been authorized for detection and/or diagnosis of SARS-CoV-2 by  FDA under an Emergency Use Authorization (EUA).  This EUA will remain in effect (meaning this test can be used) for the duration of the COVID-19 declaration under Section 564(b)(1) of the Act, 21 U.S.C. section 360bbb-3(b)(1), unless the authorization is terminated or revoked sooner.  Performed at [facility], [place] [address] [place], [place] [address]      Studies:  CT Head Wo Contrast  Result Date: 04/20/2020 CLINICAL DATA:  79 year old male with history of seizure and abnormal neurologic examination. EXAM: CT HEAD WITHOUT CONTRAST TECHNIQUE: Contiguous axial images were obtained from the base of the skull through the vertex without intravenous contrast. COMPARISON:  Head CT 03/06/2020. FINDINGS: Brain: Moderate cerebral atrophy. Patchy and confluent areas of decreased attenuation are noted throughout the deep and periventricular white matter of the cerebral hemispheres bilaterally, compatible with chronic microvascular ischemic disease. No evidence of acute infarction, hemorrhage, hydrocephalus, extra-axial collection or mass lesion/mass effect. Vascular: No hyperdense vessel or unexpected calcification. Skull: Normal. Negative for fracture or focal lesion. Sinuses/Orbits: No acute finding. Other: None. IMPRESSION: 1. No acute intracranial abnormalities. 2. Moderate cerebral atrophy with extensive chronic microvascular ischemic changes in the cerebral white matter, as above. Electronically Signed   By: Vinnie Langton M.D.   On: 04/20/2020 18:13   DG Chest Port 1 View  Result Date:  04/20/2020 CLINICAL DATA:  Post seizure, possible aspiration EXAM: PORTABLE CHEST 1 VIEW COMPARISON:  03/06/2020 FINDINGS: The heart size and mediastinal contours are within normal limits. Both lungs are clear. The visualized skeletal structures are unremarkable. IMPRESSION: No acute abnormality of the lungs in AP portable projection. Electronically Signed   By: Eddie Candle M.D.   On: 04/20/2020 16:11       [place]   [facility] If 7PM-7AM, please contact night-coverage at [email or web address], Office  [phone]   04/22/2020, 2:03 PM  LOS: 0 days

## 2020-04-23 DIAGNOSIS — E119 Type 2 diabetes mellitus without complications: Secondary | ICD-10-CM

## 2020-04-23 DIAGNOSIS — R1310 Dysphagia, unspecified: Secondary | ICD-10-CM

## 2020-04-23 DIAGNOSIS — G40909 Epilepsy, unspecified, not intractable, without status epilepticus: Secondary | ICD-10-CM

## 2020-04-23 DIAGNOSIS — R569 Unspecified convulsions: Secondary | ICD-10-CM

## 2020-04-23 DIAGNOSIS — F10232 Alcohol dependence with withdrawal with perceptual disturbance: Secondary | ICD-10-CM | POA: Diagnosis not present

## 2020-04-23 DIAGNOSIS — E785 Hyperlipidemia, unspecified: Secondary | ICD-10-CM

## 2020-04-23 DIAGNOSIS — I10 Essential (primary) hypertension: Secondary | ICD-10-CM

## 2020-04-23 DIAGNOSIS — F1010 Alcohol abuse, uncomplicated: Secondary | ICD-10-CM

## 2020-04-23 LAB — GLUCOSE, CAPILLARY
Glucose-Capillary: 100 mg/dL — ABNORMAL HIGH (ref 70–99)
Glucose-Capillary: 118 mg/dL — ABNORMAL HIGH (ref 70–99)
Glucose-Capillary: 101 mg/dL — ABNORMAL HIGH (ref 70–99)

## 2020-04-23 MED ORDER — LEVETIRACETAM 500 MG PO TABS: 500.0000 mg | ORAL_TABLET | Freq: Two times a day (BID) | ORAL | 0 refills | Status: DC

## 2020-04-23 MED ORDER — LISINOPRIL 5 MG PO TABS: 5.0000 mg | ORAL_TABLET | Freq: Every day | ORAL | Status: DC

## 2020-04-23 MED ORDER — APIXABAN 5 MG PO TABS: 5.0000 mg | ORAL_TABLET | Freq: Two times a day (BID) | ORAL | Status: DC

## 2020-04-23 MED ORDER — ATORVASTATIN CALCIUM 20 MG PO TABS: 20.0000 mg | ORAL_TABLET | Freq: Every day | ORAL | 0 refills | Status: DC

## 2020-04-23 MED ORDER — LISINOPRIL 5 MG PO TABS: 5.0000 mg | ORAL_TABLET | Freq: Every day | ORAL | 0 refills | Status: DC

## 2020-04-23 NOTE — TOC Transition Note (Addendum)
Transition of Care [facility]) - CM/SW Discharge Note   Patient Details  Name: Blake [name] MRN: [id number] Date of Birth: Dec 09, 1940  Transition of [facility]) CM/SW Contact:  Zenon Mayo, RN Phone Number: 04/23/2020, 10:22 AM   Clinical Narrative:    NCM offered choice for [name], [name], [name], patient is ok with [name], he states he really does not have a preference. NCM made referral to [facility] with [facility] via phone, awaiting call back to see if he can take referral.  NCM also gave patient the substance abuse resources. Awaiting call back to see if [name] can take referral. Per [name] with [name] , he can take referral. Patient needs ambulance transport at 5 pm to [address] [place] [address].  NCM called ptar to scheduled for 5 pm. ambulance forms are on the chart.   Final next level of care: [place] Barriers to Discharge: No Barriers Identified   Patient Goals and CMS Choice Patient states their goals for this hospitalization and ongoing recovery are:: get better CMS Medicare.gov Compare Post Acute Care list provided to:: Patient Choice offered to / list presented to : Patient  Discharge Placement                       Discharge Plan and Services                  DME Agency: NA       HH Arranged: RN, PT, OT [facility] Agency: [facility] Agency Contacted: 04/23/20 Time [place]: 1021 Representative spoke with at [place]: [place]) Interventions     Readmission Risk Interventions No flowsheet data found.

## 2020-04-23 NOTE — Discharge Instructions (Signed)
Discharge instructions  Routine Complete Order      Comment: Per [facility] statutes, patients with seizures are not allowed to drive until they have been seizure-free for six months. Use caution when using heavy equipment or power tools. Avoid working on ladders or at heights. Take showers instead of baths. Ensure the water temperature is not too high on the home water heater. Do not go swimming alone. When caring for infants or small children, sit down when holding, feeding, or changing them to minimize risk of injury to the child in the event you have a seizure. Also, Maintain good sleep hygiene. Avoid alcohol.  If patienthas another seizure, call 911 and bring them back to the ED if: A. The seizure lasts longer than 5 minutes.  B. The patient doesn't wake shortly after the seizure or has new problems such as difficulty seeing, speaking or moving following the seizure C. The patient was injured during the seizure D. The patient has a temperature over 102 F (39C) E. The patient vomited during the seizure and now is having trouble breathing

## 2020-04-23 NOTE — Progress Notes (Signed)
04/23/20 1212  PT Visit Information  Last PT Received On 04/23/20  Assistance Needed +1  History of Present Illness 79 y.o male presenting with seizure. PMH includes EtOH abuse, COPD, HTN, HLD, recent admission 03/06/20 for ?CVA vs Sz. SLP consult and swallow study showed a degree of esophageal dysphagia and a dyphagia II diet with thin liquids was recommended. Pt continues to use EtOH.  Precautions  Precautions Fall  Home Living  Family/patient expects to be discharged to: Private residence  Living Arrangements Alone  Available Help at Discharge Family;Friend(s);Available PRN/intermittently  Type of Home Mobile home  Home Access Stairs to enter  Entrance Stairs-Number of Steps 2-3  Entrance Stairs-Rails Right;Left  Home Layout One level  Bathroom Recruitment consultant - manual  Additional Comments Pt reporting conflicting information between OT and PT session.   Prior Function  Level of Independence Independent  Comments Pt reports he does not drive   Communication  Communication HOH  Pain Assessment  Pain Assessment No/denies pain  Cognition  Arousal/Alertness Awake/alert  Behavior During Therapy Flat affect  Overall Cognitive Status No family/caregiver present to determine baseline cognitive functioning  Area of Impairment Memory;Safety/judgement;Awareness;Problem solving  Memory Decreased [place]-term memory  Safety/Judgement Decreased awareness of safety;Decreased awareness of deficits  Awareness Emergent  Problem Solving Slow processing;Difficulty sequencing;Requires verbal cues;Requires tactile cues  General Comments Memory deficits noted. Decreased safety awareness as well.   Upper Extremity Assessment  Upper Extremity Assessment Defer to OT evaluation  Lower Extremity Assessment  Lower Extremity Assessment Generalized weakness  Cervical / Trunk Assessment  Cervical / Trunk Assessment Kyphotic  Bed Mobility   General bed mobility comments Sitting in chair upon entry.   Transfers  Overall transfer level Needs assistance  Equipment used Rolling walker (2 wheeled)  Transfers Sit to/from Stand  Sit to Stand Min assist  General transfer comment Min A for steadying assist using RW. Required cues for hand placement.   Ambulation/Gait  Ambulation/Gait assistance Min assist;Mod assist  Gait Distance (Feet) 75 Feet  Assistive device Rolling walker (2 wheeled)  Gait Pattern/deviations Shuffle;Decreased step length - left;Decreased step length - right;Step-through pattern  General Gait Details Unsteady gait using RW. Pt with Nicks shuffle type gait and flexed posture. Required safety cues throughout and cues for proximity to device  Gait velocity Decreased  Balance  Overall balance assessment Needs assistance  Sitting-balance support No upper extremity supported;Feet supported  Sitting balance-Leahy Scale Fair  Standing balance support Bilateral upper extremity supported;During functional activity  Standing balance-Leahy Scale Poor  Standing balance comment Reliant on UE and external support   PT - End of Session  Equipment Utilized During Treatment Gait belt  Activity Tolerance Patient tolerated treatment well  Patient left in chair;with call bell/phone within reach  Nurse Communication Mobility status  PT Assessment  PT Recommendation/Assessment Patient needs continued PT services  PT Visit Diagnosis Unsteadiness on feet (R26.81);Muscle weakness (generalized) (M62.81)  PT Problem List Decreased strength;Decreased balance;Decreased mobility;Decreased cognition;Decreased knowledge of use of DME;Decreased safety awareness;Decreased knowledge of precautions  Barriers to Discharge Decreased caregiver support  PT Plan  PT Frequency (ACUTE ONLY) Min 3X/week  PT Treatment/Interventions (ACUTE ONLY) Gait training;DME instruction;Therapeutic activities;Stair training;Functional mobility  training;Therapeutic exercise;Balance training;Cognitive remediation;Patient/family education  AM-PAC PT "6 Clicks" Mobility Outcome Measure (Version 2)  Help needed turning from your back to your side while in a flat bed without using bedrails? 3  Help needed moving from lying on your back to  sitting on the side of a flat bed without using bedrails? 3  Help needed moving to and from a bed to a chair (including a wheelchair)? 3  Help needed standing up from a chair using your arms (e.g., wheelchair or bedside chair)? 3  Help needed to walk in hospital room? 2  Help needed climbing 3-5 steps with a railing?  1  6 Click Score 15  Consider Recommendation of Discharge To: CIR/SNF/LTACH  PT Recommendation  Follow Up Recommendations SNF;Supervision/Assistance - 24 hour (max HH services if pt refuses)  PT equipment Rolling walker with 5" wheels  Individuals Consulted  Consulted and Agree with Results and Recommendations Patient  Acute Rehab PT Goals  Patient Stated Goal go home  PT Goal Formulation With patient  Time For Goal Achievement 05/07/20  Potential to Achieve Goals Good  PT Time Calculation  PT Start Time (ACUTE ONLY) 1133  PT Stop Time (ACUTE ONLY) 1149  PT Time Calculation (min) (ACUTE ONLY) 16 min  PT General Charges  $$ ACUTE PT VISIT 1 Visit  PT Evaluation  $PT Eval Moderate Complexity 1 Mod   Pt admitted secondary to problem above with deficits above. Pt requiring min to mod A for gait using RW. Very unsteady and demonstrated decreased safety awareness. Pt reports he lives alone but has a friend that can check on him. Feel he would benefit from [facility] placement, however, pt may refuse. Will need max HH services and 24/7 assist. Will continue to follow acutely to maximize functional mobility independence and safety.   Reuel Derby, PT, DPT  Acute Rehabilitation Services  Pager: [phone] Office: [phone]

## 2020-04-23 NOTE — Evaluation (Signed)
Occupational Therapy Evaluation Patient Details Name: Blake [name] MRN: [id number] DOB: 08-02-1941 Today's Date: 04/23/2020    History of Present Illness 79 y.o male presenting with seizure. PMH includes EtOH abuse, COPD, HTN, HLD, recent admission 03/06/20 for ?CVA vs Sz. SLP consult and swallow study showed a degree of esophageal dysphagia and a dyphagia II diet with thin liquids was recommended. Pt continues to use EtOH.   Clinical Impression   PTA pt living alone with intermittent assist from friends. Unsure of exact PLOF due to pt being a poor historian, but he did have a recent admission 1 month prior. At time of eval, pt presents with ability to complete bed mobility and sit <> stands at min A with RW and safety cues. Pt is unsteady on feet with poor safety awareness, also requires max A for LB BADLs. Noted cognitive deficits in safety, awareness, problem solving. Given current status, recommend SNF vs 24/7 home care to support safety and BADL engagement. Pt is at high risk of falls going home alone. OT will continue to follow per POC listed below.    Follow Up Recommendations  SNF;Other (comment) (unless home care can be 24/7)    Equipment Recommendations  3 in 1 bedside commode;Tub/shower seat    Recommendations for Other Services       Precautions / Restrictions Precautions Precautions: Fall Restrictions Weight Bearing Restrictions: No      Mobility Bed Mobility Overal bed mobility: Needs Assistance Bed Mobility: Supine to Sit     Supine to sit: Min assist     General bed mobility comments: pt requires assist to come to EOB, cues needed for safety  Transfers Overall transfer level: Needs assistance Equipment used: Rolling walker (2 wheeled) Transfers: Sit to/from [facility] Sit to Stand: Min assist Stand pivot transfers: Min assist       General transfer comment: pt requires assist for steadying support with sit <> Stand and transfer. Poor  safety awareness requiring increased cues and hand on assist. Very Febles shuffling steps with pivot    Balance Overall balance assessment: Needs assistance Sitting-balance support: Bilateral upper extremity supported;Feet supported Sitting balance-Leahy Scale: Fair     Standing balance support: Bilateral upper extremity supported;During functional activity Standing balance-Leahy Scale: Poor Standing balance comment: reliant on external support, decreased safety awareness                           ADL either performed or assessed with clinical judgement   ADL Overall ADL's : Needs assistance/impaired Eating/Feeding: Set up;Sitting   Grooming: Set up;Sitting   Upper Body Bathing: Set up;Sitting   Lower Body Bathing: Moderate assistance;Sit to/from stand;Sitting/lateral leans   Upper Body Dressing : Set up;Sitting   Lower Body Dressing: Maximal assistance;Sitting/lateral leans;Sit to/from stand Lower Body Dressing Details (indicate cue type and reason): to don socks due to pain and long toe nails Toilet Transfer: Minimal assistance;RW;Ambulation;Cueing for safety;Cueing for sequencing Toilet Transfer Details (indicate cue type and reason): for safety and steadying with transfers. Cues and assist for safe descent Toileting- Clothing Manipulation and Hygiene: Moderate assistance;Sit to/from stand;Sitting/lateral lean       Functional mobility during ADLs: Minimal assistance;Rolling walker;Cueing for safety       Vision Patient Visual Report: No change from baseline Additional Comments: R pupil appears slightly blown     Perception     Praxis      Pertinent Vitals/Pain Pain Assessment: No/denies pain  Hand Dominance Right   Extremity/Trunk Assessment Upper Extremity Assessment Upper Extremity Assessment: Generalized weakness   Lower Extremity Assessment Lower Extremity Assessment: Generalized weakness       Communication  Communication Communication: HOH   Cognition Arousal/Alertness: Awake/alert Behavior During Therapy: Flat affect Overall Cognitive Status: No family/caregiver present to determine baseline cognitive functioning Area of Impairment: Memory;Safety/judgement;Awareness;Problem solving                     Memory: Decreased Bench-term memory   Safety/Judgement: Decreased awareness of safety;Decreased awareness of deficits Awareness: Emergent ("can I get a perscription for a catheter so I can just piss and let it go at any time") Problem Solving: Slow processing;Difficulty sequencing;Requires verbal cues;Requires tactile cues General Comments: pt presents with a decreased ability to recall ST ideas, with poor safety and awareness of deficits. Pt asked this OT if he can have a persciption to have a catheter at all time. Very unaware that he is unsafe   General Comments       Exercises     Shoulder Instructions      Home Living Family/patient expects to be discharged to:: Private residence Living Arrangements: Alone Available Help at Discharge: Family;Friend(s);Available PRN/intermittently Type of Home: Mobile home Home Access: Level entry     Home Layout: One level     Bathroom Shower/Tub: Occupational psychologist: Standard     Home Equipment: None   Additional Comments: Pt reports he lives alone and his friend "Blake [name]" assists him PRN and provides transportation       Prior Functioning/Environment Level of Independence: Independent        Comments: Pt reports he does not drive         OT Problem List: Decreased strength;Decreased knowledge of use of DME or AE;Decreased activity tolerance;Decreased cognition;Impaired balance (sitting and/or standing);Decreased safety awareness      OT Treatment/Interventions: Self-care/ADL training;Therapeutic exercise;Patient/family education;Balance training;Energy conservation;Therapeutic activities;DME and/or AE  instruction;Cognitive remediation/compensation    OT Goals(Current goals can be found in the care plan section) Acute Rehab OT Goals Patient Stated Goal: go home OT Goal Formulation: With patient Time For Goal Achievement: 05/07/20 Potential to Achieve Goals: Good  OT Frequency: Min 2X/week   Barriers to D/C:            Co-evaluation              AM-PAC OT "6 Clicks" Daily Activity     Outcome Measure Help from another person eating meals?: A Little Help from another person taking care of personal grooming?: A Little Help from another person toileting, which includes using toliet, bedpan, or urinal?: A Lot Help from another person bathing (including washing, rinsing, drying)?: A Lot Help from another person to put on and taking off regular upper body clothing?: A Little Help from another person to put on and taking off regular lower body clothing?: A Lot 6 Click Score: 15   End of Session Equipment Utilized During Treatment: Gait belt;Rolling walker Nurse Communication: Mobility status  Activity Tolerance: Patient tolerated treatment well Patient left: in chair;with call bell/phone within reach;with chair alarm set  OT Visit Diagnosis: Other abnormalities of gait and mobility (R26.89);Muscle weakness (generalized) (M62.81);Other symptoms and signs involving cognitive function;Unsteadiness on feet (R26.81)                Time: 8657-8469 OT Time Calculation (min): 22 min Charges:  OT General Charges $OT Visit: 1 Visit OT Evaluation $OT Eval Moderate Complexity:  [place], MSOT, OTR/L [place] Office Number: [phone] Pager: [phone]  Zenovia Jarred 04/23/2020, 11:57 AM

## 2020-04-23 NOTE — Discharge Summary (Addendum)
Physician Discharge Summary  Blake [name] QHU:[id number] DOB: 02/21/1941 DOA: 04/20/2020  PCP: Patient, No Pcp Per  Admit date: 04/20/2020 Discharge date: 04/23/2020  Admitted From: Home  Disposition:  Home   Recommendations for Outpatient Follow-up and new medication changes:  1. Follow up with Primary care in 7 days.  2. New prescriptions given for apixaban and keppra.  3. Patient declined SNF, I explained concerns of safety, but he assures he will have help available, he agrees with home health.   Home Health: yes   Equipment/Devices no   Discharge Condition: stable  CODE STATUS: full  Diet recommendation:  Heart healthy   Brief/Interim Summary: Patient admitted to the hospital with a working diagnosis of alcohol withdrawal seizures.  79 year old male with significant past medical history for alcohol abuse, COPD, hypertension and dyslipidemia.  Patient had recent hospitalization June 2021, for CVA/atrial fibrillation/seizures.  Patient was placed on Keppra and apixaban.  He reported not taking any of those medications at home.   On the day of admission he was witnessed to have a generalized seizure activity.  On his initial physical examination he was afebrile, blood pressure 163/96, heart rate 77, respiratory rate 16, oxygen saturation 99%.  Moist mucous membranes, lungs are clear to auscultation bilaterally, heart S1-S2, present and rhythmic, soft abdomen, no lower extremity edema, patient was awake and alert, neurologically nonfocal. ABG pH 7.20, PCO2 52, PO2 92, bicarb 22, sodium 139, potassium 4.5, chloride 108, bicarb 20, glucose 134, BUN 18, creatinine 1.74, white count 12.6, hemoglobin 11.9, hematocrit 37.0, platelets 187.  SARS COVID-19 negative.  Alcohol less than 10, salicylate less than 7.   Head CT with no acute changes, moderate cerebral atrophy with extensive chronic microvascular ischemic changes.  Chest radiograph with no infiltrates. EKG 83 bpm, left axis deviation,  left anterior fascicular block, first-degree AV block, no ST segment or T wave changes.  Keppra was resumed and patient was placed on seizure precautions.  He received alcohol withdrawal protocol with benzodiazepines.   1.  Alcohol withdrawal seizures/ transitory acute hypercapnic respiratory failure with acute respiratory acidosis.  Patient was admitted to the telemetry ward, he had frequent neuro checks and placed on alcohol withdrawal protocol/CIWA with lorazepam with good toleration.  Keppra was resumed, patient had no further seizure activity.  He had a full work-up including electroencephalography in June 2021.  He was advised about alcohol consumption cessation.  By the time of discharge no active withdrawal symptoms.  2.  Dysphagia.  Patient was continue on aspiration precautions.  3.  Hypertension.  Continue blood pressure control with lisinopril.  4.  Chronic kidney disease stage IIIb.  Patient's renal function remained stable, creatinine 1.6-1.8.  Continue blood pressure and glucose control, follow-up as an outpatient.  5.  Controlled type 2 diabetes mellitus, hemoglobin A1c 5.3/ dyslipidemia  Patient was placed on insulin sliding scale for glucose coverage and monitoring during his hospitalization.  Continue statin therapy.  6.  Paroxysmal atrial fibrillation/second-degree type I AV block.  Patient will continue anticoagulation with apixaban, his heart rate remained well controlled, his telemetry showed first-degree AV block.  7. Tobacco abuse. Continue smoking cessation.   8. Severe calorie protein malnutrition. Patient was placed on nutritional supplements. Follow as outpatient.   9. Hx of CVA.  Continue blood pressure control and statin therapy.   Discharge Diagnoses:  Principal Problem:   Seizure disorder [facility]) Active Problems:   Type 2 diabetes mellitus (HCC)   Dyslipidemia   Essential hypertension   ETOH  abuse   Dysphagia   Seizure ([place])   Alcohol withdrawal  ([place])    Discharge Instructions   Allergies as of 04/23/2020   No Known Allergies     Medication List    STOP taking these medications   feeding supplement (ENSURE ENLIVE) Liqd   folic acid 1 MG tablet Commonly known as: FOLVITE   food thickener Powd Commonly known as: THICK IT   hydrALAZINE 20 MG/ML injection Commonly known as: APRESOLINE   ipratropium-albuterol 0.5-2.5 (3) MG/3ML Soln Commonly known as: DUONEB   mouth rinse Liqd solution   multivitamin with minerals Tabs tablet   pantoprazole 40 MG tablet Commonly known as: PROTONIX   thiamine 100 MG tablet     TAKE these medications   apixaban 5 MG Tabs tablet Commonly known as: ELIQUIS Take 1 tablet (5 mg total) by mouth 2 (two) times daily.   atorvastatin 20 MG tablet Commonly known as: LIPITOR Take 1 tablet (20 mg total) by mouth daily.   levETIRAcetam 500 MG tablet Commonly known as: KEPPRA Take 1 tablet (500 mg total) by mouth 2 (two) times daily.   lisinopril 5 MG tablet Commonly known as: ZESTRIL Take 1 tablet (5 mg total) by mouth daily.   Vitamin D-3 25 MCG (1000 UT) Caps Take 1 capsule (1,000 Units total) by mouth daily.       No Known Allergies      Procedures/Studies: CT Head Wo Contrast  Result Date: 04/20/2020 CLINICAL DATA:  79 year old male with history of seizure and abnormal neurologic examination. EXAM: CT HEAD WITHOUT CONTRAST TECHNIQUE: Contiguous axial images were obtained from the base of the skull through the vertex without intravenous contrast. COMPARISON:  Head CT 03/06/2020. FINDINGS: Brain: Moderate cerebral atrophy. Patchy and confluent areas of decreased attenuation are noted throughout the deep and periventricular white matter of the cerebral hemispheres bilaterally, compatible with chronic microvascular ischemic disease. No evidence of acute infarction, hemorrhage, hydrocephalus, extra-axial collection or mass lesion/mass effect. Vascular: No hyperdense vessel or  unexpected calcification. Skull: Normal. Negative for fracture or focal lesion. Sinuses/Orbits: No acute finding. Other: None. IMPRESSION: 1. No acute intracranial abnormalities. 2. Moderate cerebral atrophy with extensive chronic microvascular ischemic changes in the cerebral white matter, as above. Electronically Signed   By: Vinnie Langton M.D.   On: 04/20/2020 18:13   DG Chest Port 1 View  Result Date: 04/20/2020 CLINICAL DATA:  Post seizure, possible aspiration EXAM: PORTABLE CHEST 1 VIEW COMPARISON:  03/06/2020 FINDINGS: The heart size and mediastinal contours are within normal limits. Both lungs are clear. The visualized skeletal structures are unremarkable. IMPRESSION: No acute abnormality of the lungs in AP portable projection. Electronically Signed   By: Eddie Candle M.D.   On: 04/20/2020 16:11        Subjective: Patient is feeling well, no nausea or vomiting, no chest pain or dyspnea, denies any anxiety or tremors. No further seizure activity.   Discharge Exam: Vitals:   04/23/20 0428 04/23/20 0737  BP: (!) 153/72 (!) 150/84  Pulse: 95 87  Resp:  20  Temp:  98 F (36.7 C)  SpO2: 99% 98%   Vitals:   04/22/20 2321 04/23/20 0312 04/23/20 0428 04/23/20 0737  BP: (!) 161/76 (!) 185/90 (!) 153/72 (!) 150/84  Pulse: 61 68 95 87  Resp: 21 20  20   Temp: 97.9 F (36.6 C) 97.9 F (36.6 C)  98 F (36.7 C)  TempSrc: Oral Oral  Oral  SpO2: 98% 98% 99% 98%  General: Not in pain or dyspnea.  Neurology: Awake and alert, non focal. No tremors. E ENT: no pallor, no icterus, oral mucosa moist Cardiovascular: No JVD. S1-S2 present, rhythmic, no gallops, rubs, or murmurs. No lower extremity edema. Pulmonary: vesicular breath sounds bilaterally, adequate air movement, no wheezing, rhonchi or rales. Gastrointestinal. Abdomen soft and non distended and non tender.  Skin. No rashes Musculoskeletal: no joint deformities   The results of significant diagnostics from this  hospitalization (including imaging, microbiology, ancillary and laboratory) are listed below for reference.     Microbiology: Recent Results (from the past 240 hour(s))  SARS Coronavirus 2 by RT PCR (hospital order, performed in [facility] hospital lab) Nasopharyngeal Nasopharyngeal Swab     Status: None   Collection Time: 04/20/20  7:03 PM   Specimen: Nasopharyngeal Swab  Result Value Ref Range Status   SARS Coronavirus 2 NEGATIVE NEGATIVE Final    Comment: (NOTE) SARS-CoV-2 target nucleic acids are NOT DETECTED.  The SARS-CoV-2 RNA is generally detectable in upper and lower respiratory specimens during the acute phase of infection. The lowest concentration of SARS-CoV-2 viral copies this assay can detect is 250 copies / mL. A negative result does not preclude SARS-CoV-2 infection and should not be used as the sole basis for treatment or other patient management decisions.  A negative result may occur with improper specimen collection / handling, submission of specimen other than nasopharyngeal swab, presence of viral mutation(s) within the areas targeted by this assay, and inadequate number of viral copies (<250 copies / mL). A negative result must be combined with clinical observations, patient history, and epidemiological information.  Fact Sheet for Patients:   StrictlyIdeas.no  Fact Sheet for Healthcare Providers: BankingDealers.co.za  This test is not yet approved or  cleared by the [place] FDA and has been authorized for detection and/or diagnosis of SARS-CoV-2 by FDA under an Emergency Use Authorization (EUA).  This EUA will remain in effect (meaning this test can be used) for the duration of the COVID-19 declaration under Section 564(b)(1) of the Act, 21 U.S.C. section 360bbb-3(b)(1), unless the authorization is terminated or revoked sooner.  Performed at [facility], [place] [address] [place],  [place] [address]      Labs: BNP (last 3 results) No results for input(s): BNP in the last 8760 hours. Basic Metabolic Panel: Recent Labs  Lab 04/20/20 1534 04/20/20 1543 04/20/20 1549 04/20/20 1819 04/22/20 0435  NA 141 139 141  --  138  K 4.5 4.5 4.4  --  4.2  CL  --  108 107  --  109  CO2  --  20*  --   --  22  GLUCOSE  --  134* 130*  --  126*  BUN  --  18 20  --  21  CREATININE  --  1.74* 1.60*  --  1.80*  CALCIUM  --  8.7*  --   --  9.0  MG  --   --   --  2.1  --   PHOS  --   --   --  3.7  --    Liver Function Tests: Recent Labs  Lab 04/20/20 1543  AST 20  ALT 10  ALKPHOS 87  BILITOT 0.4  PROT 6.4*  ALBUMIN 3.5   No results for input(s): LIPASE, AMYLASE in the last 168 hours. No results for input(s): AMMONIA in the last 168 hours. CBC: Recent Labs  Lab 04/20/20 1534 04/20/20 1543 04/20/20 1549 04/22/20 0435  WBC  --  12.6*  --  8.1  NEUTROABS  --  10.2*  --   --   HGB 11.6* 11.9* 12.2* 11.9*  HCT 34.0* 37.0* 36.0* 36.7*  MCV  --  97.4  --  97.6  PLT  --  187  --  187   Cardiac Enzymes: No results for input(s): CKTOTAL, CKMB, CKMBINDEX, TROPONINI in the last 168 hours. BNP: Invalid input(s): POCBNP CBG: Recent Labs  Lab 04/21/20 1717 04/22/20 1205 04/22/20 1607 04/22/20 2120 04/23/20 0736  GLUCAP 83 83 136* 93 100*   D-Dimer No results for input(s): DDIMER in the last 72 hours. Hgb A1c No results for input(s): HGBA1C in the last 72 hours. Lipid Profile No results for input(s): CHOL, HDL, LDLCALC, TRIG, CHOLHDL, LDLDIRECT in the last 72 hours. Thyroid function studies No results for input(s): TSH, T4TOTAL, T3FREE, THYROIDAB in the last 72 hours.  Invalid input(s): FREET3 Anemia work up No results for input(s): VITAMINB12, FOLATE, FERRITIN, TIBC, IRON, RETICCTPCT in the last 72 hours. Urinalysis    Component Value Date/Time   COLORURINE YELLOW 03/09/2020 1437   APPEARANCEUR HAZY (A) 03/09/2020 1437   LABSPEC 1.015 03/09/2020 1437    PHURINE 5.0 03/09/2020 1437   GLUCOSEU NEGATIVE 03/09/2020 1437   GLUCOSEU NEGATIVE 02/02/2011 1543   HGBUR LARGE (A) 03/09/2020 1437   BILIRUBINUR NEGATIVE 03/09/2020 1437   KETONESUR NEGATIVE 03/09/2020 1437   PROTEINUR 30 (A) 03/09/2020 1437   UROBILINOGEN 1.0 10/14/2014 1211   NITRITE NEGATIVE 03/09/2020 1437   LEUKOCYTESUR NEGATIVE 03/09/2020 1437   Sepsis Labs Invalid input(s): PROCALCITONIN,  WBC,  LACTICIDVEN Microbiology Recent Results (from the past 240 hour(s))  SARS Coronavirus 2 by RT PCR (hospital order, performed in [facility] hospital lab) Nasopharyngeal Nasopharyngeal Swab     Status: None   Collection Time: 04/20/20  7:03 PM   Specimen: Nasopharyngeal Swab  Result Value Ref Range Status   SARS Coronavirus 2 NEGATIVE NEGATIVE Final    Comment: (NOTE) SARS-CoV-2 target nucleic acids are NOT DETECTED.  The SARS-CoV-2 RNA is generally detectable in upper and lower respiratory specimens during the acute phase of infection. The lowest concentration of SARS-CoV-2 viral copies this assay can detect is 250 copies / mL. A negative result does not preclude SARS-CoV-2 infection and should not be used as the sole basis for treatment or other patient management decisions.  A negative result may occur with improper specimen collection / handling, submission of specimen other than nasopharyngeal swab, presence of viral mutation(s) within the areas targeted by this assay, and inadequate number of viral copies (<250 copies / mL). A negative result must be combined with clinical observations, patient history, and epidemiological information.  Fact Sheet for Patients:   StrictlyIdeas.no  Fact Sheet for Healthcare Providers: BankingDealers.co.za  This test is not yet approved or  cleared by the [place] FDA and has been authorized for detection and/or diagnosis of SARS-CoV-2 by FDA under an Emergency Use Authorization (EUA).   This EUA will remain in effect (meaning this test can be used) for the duration of the COVID-19 declaration under Section 564(b)(1) of the Act, 21 U.S.C. section 360bbb-3(b)(1), unless the authorization is terminated or revoked sooner.  Performed at [facility], [place] [address], [place], [place] [address]      Time coordinating discharge: 45 minutes  SIGNED:   Tawni Millers, MD  [facility] 04/23/2020, 9:05 AM

## 2020-06-04 ENCOUNTER — Other Ambulatory Visit: Payer: Self-pay

## 2020-06-04 NOTE — Patient Outreach (Signed)
[facility] [facility]) Care Management  06/04/2020  Avyaan Summer Baity [birth year] [id number]   Spoke with Cathey Endow, POA for the patient, and he stated that patient did not have a stroke in June. He was told that it was seizures both times. MRS score was not completed due to this.  [facility] Management Assistant [phone]

## 2020-06-16 ENCOUNTER — Other Ambulatory Visit: Payer: Self-pay

## 2020-06-16 NOTE — Patient Outreach (Signed)
[facility]) Care Management  06/16/2020  CEM KOSMAN 10/24/1940 [id number]   First telephone outreach attempt to obtain mRS. No answer. Left message for returned call.  [facility] Management Assistant [phone]

## 2020-06-18 ENCOUNTER — Other Ambulatory Visit: Payer: Self-pay

## 2020-06-18 NOTE — Patient Outreach (Signed)
[place]) Care Management  06/18/2020  Arren Laminack Janco 06/22/1941 [id number]   Second telephone outreach attempt to obtain mRS. No answer. Left message for returned call for Blake [name]- patient's POA.  [facility] Management Assistant [phone]

## 2020-06-26 ENCOUNTER — Other Ambulatory Visit: Payer: Self-pay

## 2020-06-26 NOTE — Patient Outreach (Signed)
[facility]) Care Management  06/26/2020  Ceylon Arenson Wessells [birth year] [id number]  3 outreach attempts were completed to obtain mRs. mRs could not be obtained because patient never returned my calls. mRs=7    [facility] Management Assistant [phone]

## 2021-05-30 ENCOUNTER — Emergency Department (HOSPITAL_COMMUNITY): Payer: Medicare Other

## 2021-05-30 ENCOUNTER — Encounter (HOSPITAL_COMMUNITY): Payer: Self-pay | Admitting: Emergency Medicine

## 2021-05-30 ENCOUNTER — Emergency Department (HOSPITAL_COMMUNITY)
Admission: EM | Admit: 2021-05-30 | Discharge: 2021-05-31 | Disposition: A | Discharge status: Home / Self Care | Payer: Medicare Other | Attending: Emergency Medicine | Admitting: Emergency Medicine

## 2021-05-30 ENCOUNTER — Other Ambulatory Visit: Payer: Self-pay

## 2021-05-30 DIAGNOSIS — Z79899 Other long term (current) drug therapy: Secondary | ICD-10-CM | POA: Insufficient documentation

## 2021-05-30 DIAGNOSIS — F17210 Nicotine dependence, cigarettes, uncomplicated: Secondary | ICD-10-CM | POA: Diagnosis not present

## 2021-05-30 DIAGNOSIS — Y900 Blood alcohol level of less than 20 mg/100 ml: Secondary | ICD-10-CM | POA: Insufficient documentation

## 2021-05-30 DIAGNOSIS — Z7901 Long term (current) use of anticoagulants: Secondary | ICD-10-CM | POA: Insufficient documentation

## 2021-05-30 DIAGNOSIS — R404 Transient alteration of awareness: Secondary | ICD-10-CM

## 2021-05-30 DIAGNOSIS — I10 Essential (primary) hypertension: Secondary | ICD-10-CM | POA: Insufficient documentation

## 2021-05-30 DIAGNOSIS — G459 Transient cerebral ischemic attack, unspecified: Secondary | ICD-10-CM | POA: Insufficient documentation

## 2021-05-30 DIAGNOSIS — F0390 Unspecified dementia without behavioral disturbance: Secondary | ICD-10-CM | POA: Insufficient documentation

## 2021-05-30 DIAGNOSIS — J449 Chronic obstructive pulmonary disease, unspecified: Secondary | ICD-10-CM | POA: Insufficient documentation

## 2021-05-30 DIAGNOSIS — E785 Hyperlipidemia, unspecified: Secondary | ICD-10-CM | POA: Insufficient documentation

## 2021-05-30 DIAGNOSIS — E1169 Type 2 diabetes mellitus with other specified complication: Secondary | ICD-10-CM | POA: Diagnosis not present

## 2021-05-30 LAB — COMPREHENSIVE METABOLIC PANEL
ALT: 9 U/L (ref 0–44)
AST: 18 U/L (ref 15–41)
Albumin: 3.8 g/dL (ref 3.5–5.0)
Alkaline Phosphatase: 91 U/L (ref 38–126)
Anion gap: 6 (ref 5–15)
BUN: 14 mg/dL (ref 8–23)
CO2: 24 mmol/L (ref 22–32)
Calcium: 9.1 mg/dL (ref 8.9–10.3)
Chloride: 103 mmol/L (ref 98–111)
Creatinine, Ser: 1.55 mg/dL — ABNORMAL HIGH (ref 0.61–1.24)
GFR, Estimated: 45 mL/min — ABNORMAL LOW (ref >60)
Glucose, Bld: 96 mg/dL (ref 70–99)
Potassium: 4.7 mmol/L (ref 3.5–5.1)
Sodium: 133 mmol/L — ABNORMAL LOW (ref 135–145)
Total Bilirubin: 0.7 mg/dL (ref 0.3–1.2)
Total Protein: 6.5 g/dL (ref 6.5–8.1)

## 2021-05-30 LAB — RAPID URINE DRUG SCREEN, HOSP PERFORMED
Amphetamines: NOT DETECTED
Barbiturates: NOT DETECTED
Benzodiazepines: NOT DETECTED
Cocaine: NOT DETECTED
Opiates: NOT DETECTED
Tetrahydrocannabinol: NOT DETECTED

## 2021-05-30 LAB — PROTIME-INR
INR: 0.9 (ref 0.8–1.2)
Prothrombin Time: 12.6 seconds (ref 11.4–15.2)

## 2021-05-30 LAB — URINALYSIS, ROUTINE W REFLEX MICROSCOPIC
Bacteria, UA: NONE SEEN
Bilirubin Urine: NEGATIVE
Glucose, UA: NEGATIVE mg/dL
Ketones, ur: NEGATIVE mg/dL
Leukocytes,Ua: NEGATIVE
Nitrite: NEGATIVE
Protein, ur: NEGATIVE mg/dL
RBC / HPF: >50 RBC/hpf — ABNORMAL HIGH (ref 0–5)
Specific Gravity, Urine: 1.006 (ref 1.005–1.030)
pH: 6 (ref 5.0–8.0)

## 2021-05-30 LAB — CBC
HCT: 41.7 % (ref 39.0–52.0)
Hemoglobin: 14 g/dL (ref 13.0–17.0)
MCH: 32.7 pg (ref 26.0–34.0)
MCHC: 33.6 g/dL (ref 30.0–36.0)
MCV: 97.4 fL (ref 80.0–100.0)
Platelets: 230 10*3/uL (ref 150–400)
RBC: 4.28 MIL/uL (ref 4.22–5.81)
RDW: 12.9 % (ref 11.5–15.5)
WBC: 10.4 10*3/uL (ref 4.0–10.5)
nRBC: 0 % (ref 0.0–0.2)

## 2021-05-30 LAB — ETHANOL: Alcohol, Ethyl (B): <10 mg/dL (ref <10)

## 2021-05-30 MED ORDER — LORAZEPAM 2 MG/ML IJ SOLN: 0.5000 mg | INTRAMUSCULAR | Status: DC | PRN

## 2021-05-30 NOTE — ED Notes (Signed)
POA [place] [phone] would like an update

## 2021-05-30 NOTE — ED Provider Notes (Addendum)
Emergency Medicine Provider Triage Evaluation Note  MEHDI MEYERHOFER , a 80 y.o. male  was evaluated in triage.  Pt complains of TIA.  Review of Systems  Positive: L side weakness, falls Negative: Headache, cp, sob, focal numbness  Physical Exam  There were no vitals taken for this visit. Gen:   Awake, no distress   Resp:  Normal effort  MSK:   Moves extremities without difficulty  Other:  5/5 strength to all 4 extremities. AXOx4, unequal pupils (chronic)  Medical Decision Making  Medically screening exam initiated at 8:14 PM.  Appropriate orders placed.  Kennard Johannsen Plaut was informed that the remainder of the evaluation will be completed by another provider, this initial triage assessment does not replace that evaluation, and the importance of remaining in the ED until their evaluation is complete.  Pt with hx of prior stroke and seizure not on anticoagulant brought here for L sided weakness and 2 falls earlier today. LKN yesterday evening.  Weakness resolved.  No active complaint.  Hard of hearing. Hx dementia      Domenic Moras, Hershal Coria 05/30/21 2024    Varney Biles, MD 05/31/21 1141

## 2021-05-30 NOTE — ED Provider Notes (Signed)
[facility] EMERGENCY DEPARTMENT Provider Note   CSN: [id number] Arrival date & time: 05/30/21  2013     History Chief Complaint  Patient presents with   Transient Ischemic Attack    Blake [name] is a 80 y.o. male.  HPI  80 year old male with past medical history of COPD, EtOH abuse, GERD, HLD, HTN, prior CVA, dementia who presents to the emergency department with concern for possible TIA.  History is limited as the patient has a history of dementia.  Level 5 caveat applies.  Per EMS, according to family members the patient had left-sided weakness which resolved prior to EMS arrival.  No family members were at bedside on patient arrival to provide additional HPI.  I attempted contact the healthcare POA Arielle at the number listed in the chart in addition to multiple other family members with out success.  Past Medical History:  Diagnosis Date   Chronic insomnia    Cigarette smoker    COPD (chronic obstructive pulmonary disease) (HCC)    Diverticulosis of colon    Elevated prostate specific antigen (PSA)    ETOH abuse    GERD (gastroesophageal reflux disease)    Hx of colonic polyps    Hyperlipidemia    Hypertension    Pulmonary nodule    Shortness of breath    Stroke [facility])     Patient Active Problem List   Diagnosis Date Noted   Alcohol withdrawal ([place]) 04/22/2020   Seizure ([place]) 04/20/2020   Leukocytosis 03/13/2020   Stroke-like symptoms s/p tPA - ? TIA vs aborted stroke vs seizure 03/06/2020   Acute CVA (cerebrovascular accident) ([place]) 01/10/2019   Esophageal stricture    Abnormal finding on GI tract imaging    Dysphagia 07/20/2018   Malnutrition of moderate degree 07/10/2018   Aspiration pneumonia ([place]) 07/07/2018   Seizure disorder ([place]) 07/07/2018   HLD (hyperlipidemia) 07/07/2018   Acute hypoxemic respiratory failure ([place]) 07/07/2018   Encounter for intubation    Acute respiratory failure with hypercapnia ([place])    HCAP  (healthcare-associated pneumonia)    Protein-calorie malnutrition, severe 06/26/2018   Alcohol withdrawal seizure ([place]) 06/25/2018   New onset seizure ([place]) 01/07/2018   Hypertension 01/07/2018   GERD (gastroesophageal reflux disease) 01/07/2018   Hyperlipidemia 01/07/2018   Cigarette smoker 01/07/2018   Alcohol abuse 01/07/2018   Acute alcohol abuse, with delirium ([place])    Acute encephalopathy 10/14/2014   Unsteady gait 10/14/2014   Alcohol withdrawal delirium ([place]) 10/14/2014   Lipoma of neck 12/22/2012   Cerebral atrophy ([place]) 09/28/2012   Cerebrovascular small vessel disease 09/28/2012   Left humeral fracture 07/04/2012   Delirium 07/04/2012   ETOH abuse    ELEVATED PROSTATE SPECIFIC ANTIGEN 09/30/2010   COPD (chronic obstructive pulmonary disease) ([place]) 01/28/2009   PULMONARY NODULE 01/28/2009   INSOMNIA, CHRONIC 10/15/2007   COLONIC POLYPS 10/10/2007   Type 2 diabetes mellitus ([place]) 10/10/2007   Dyslipidemia 10/10/2007   Essential hypertension 10/10/2007   GERD 10/10/2007   DIVERTICULOSIS OF COLON 10/10/2007    Past Surgical History:  Procedure Laterality Date   COLONOSCOPY     ESOPHAGOGASTRODUODENOSCOPY (EGD) WITH PROPOFOL N/A 07/21/2018   Procedure: ESOPHAGOGASTRODUODENOSCOPY (EGD) WITH PROPOFOL;  Surgeon: Lavena Bullion, DO;  Location: [facility];  Service: Gastroenterology;  Laterality: N/A;   needle biopsy RUL nodule  01/2009   benign   TONSILLECTOMY  1947   VASECTOMY         Family History  Problem Relation Age of Onset  Diabetes Father    Lung cancer Brother    Colon cancer Neg Hx    Esophageal cancer Neg Hx    Stomach cancer Neg Hx     Social History   Tobacco Use   Smoking status: Every Day    Packs/day: 1.00    Years: 55.00    Pack years: 55.00    Types: Cigarettes   Smokeless tobacco: Never   Tobacco comments:    not smoking @ SNF  Substance Use Topics   Alcohol use: Yes    Alcohol/week: 24.0 standard drinks    Types: 24 Cans  of beer per week    Comment: 3-4 daily   Drug use: No    Home Medications Prior to Admission medications   Medication Sig Start Date End Date Taking? Authorizing Provider  apixaban (ELIQUIS) 5 MG TABS tablet Take 1 tablet (5 mg total) by mouth 2 (two) times daily. 04/23/20 05/23/20  Arrien, Jimmy Picket, MD  atorvastatin (LIPITOR) 20 MG tablet Take 1 tablet (20 mg total) by mouth daily. 04/23/20 05/23/20  Arrien, Jimmy Picket, MD  Cholecalciferol (VITAMIN D-3) 25 MCG (1000 UT) CAPS Take 1 capsule (1,000 Units total) by mouth daily. 08/11/18   Medina-Vargas, Monina C, NP  levETIRAcetam (KEPPRA) 500 MG tablet Take 1 tablet (500 mg total) by mouth 2 (two) times daily. 04/23/20 05/23/20  Arrien, Jimmy Picket, MD  lisinopril (ZESTRIL) 5 MG tablet Take 1 tablet (5 mg total) by mouth daily. 04/23/20 05/23/20  Arrien, Jimmy Picket, MD    Allergies    Patient has no known allergies.  Review of Systems   Review of Systems  Unable to perform ROS: Dementia   Physical Exam Updated Vital Signs BP (!) 129/114 (BP Location: Right Arm)   Pulse 70   Temp 98.5 F (36.9 C) (Oral)   Resp 18   SpO2 100%   Physical Exam Vitals and nursing note reviewed.  Constitutional:      Appearance: He is well-developed.  HENT:     Head: Normocephalic and atraumatic.  Eyes:     Conjunctiva/sclera: Conjunctivae normal.  Cardiovascular:     Rate and Rhythm: Normal rate and regular rhythm.     Heart sounds: No murmur heard. Pulmonary:     Effort: Pulmonary effort is normal. No respiratory distress.     Breath sounds: Normal breath sounds.  Abdominal:     Palpations: Abdomen is soft.     Tenderness: There is no abdominal tenderness.  Musculoskeletal:     Cervical back: Neck supple.  Skin:    General: Skin is warm and dry.  Neurological:     Mental Status: He is alert and oriented to person, place, and time.     GCS: GCS eye subscore is 4. GCS verbal subscore is 4. GCS motor subscore is 6.      Comments: Pupillary defect noted with anisocoria noted. Unclear if acute or chronic. Remainder of cranial nerves intact. 5/5 strength in all four extremities. No dysmetria. Intact sensation to light touch all four extremities.    ED Results / Procedures / Treatments   Labs (all labs ordered are listed, but only abnormal results are displayed) Labs Reviewed  COMPREHENSIVE METABOLIC PANEL - Abnormal; Notable for the following components:      Result Value   Sodium 133 (*)    Creatinine, Ser 1.55 (*)    GFR, Estimated 45 (*)    All other components within normal limits  URINALYSIS, ROUTINE W REFLEX MICROSCOPIC -  Abnormal; Notable for the following components:   Color, Urine STRAW (*)    Hgb urine dipstick LARGE (*)    RBC / HPF >50 (*)    All other components within normal limits  CBC  ETHANOL  PROTIME-INR  RAPID URINE DRUG SCREEN, HOSP PERFORMED  CBG MONITORING, ED    EKG None  Radiology CT HEAD WO CONTRAST  Result Date: 05/30/2021 CLINICAL DATA:  Transient ischemic attack (TIA). Left-sided weakness, multiple falls, dementia. EXAM: CT HEAD WITHOUT CONTRAST TECHNIQUE: Contiguous axial images were obtained from the base of the skull through the vertex without intravenous contrast. COMPARISON:  04/20/2020, MRI 01/10/2019 FINDINGS: Brain: Normal anatomic configuration. Moderate parenchymal volume loss is commensurate with the patient's age. Mild periventricular white matter changes are present likely reflecting the sequela of small vessel ischemia. No abnormal intra or extra-axial mass lesion or fluid collection. No abnormal mass effect or midline shift. No evidence of acute intracranial hemorrhage or infarct. Mild ventriculomegaly is commensurate with the degree of cerebral volume loss and likely represents central atrophy. This appears stable since remote prior MRI examination of 01/10/2019. Cerebellum unremarkable. Vascular: No asymmetric hyperdense vasculature at the skull base. Skull:  Intact Sinuses/Orbits: Paranasal sinuses are clear. Orbits are unremarkable. Other: Mastoid air cells and middle ear cavities are clear. IMPRESSION: No acute intracranial abnormality. Moderate senescent change, stable since prior examination. Electronically Signed   By: Fidela Salisbury M.D.   On: 05/30/2021 21:22    Procedures Procedures   Medications Ordered in ED Medications - No data to display  ED Course  I have reviewed the triage vital signs and the nursing notes.  Pertinent labs & imaging results that were available during my care of the patient were reviewed by me and considered in my medical decision making (see chart for details).    MDM Rules/Calculators/A&P                           80 year old male with past medical history as above to include dementia and prior CVA with unclear residual deficits presenting to the emergency department with concern by family members for TIA at home.  Unable to reach family members immediately in the emergency department on patient arrival.  Multiple times were made to call family at the phone numbers listed in the electronic medical record.  The patient appears to be at baseline mental status with out evidence of acute deficit.  Anisocoria noted on pupillary exam otherwise no deficits noted.  CT head without acute intracranial abnormality, screening labs obtained without significant deficit to explain the patient's presentation.  Will obtain MRI of the head and neck to further evaluate given report of possible TIA.  Plan at time of signout to follow-up MRI imaging, attempt to contact family for corroborative information.  Signout given to Dr. Christy Gentles at 2330. Final Clinical Impression(s) / ED Diagnoses Final diagnoses:  None    Rx / DC Orders ED Discharge Orders     None        Regan Lemming, MD 05/31/21 [phone]

## 2021-05-30 NOTE — ED Triage Notes (Addendum)
Per [facility], pt from home, they were called out X2 for TIA, more exactly unequal strength on L side which had resided prior to EMS arrival.  Also reports falls where he is altered directly after.  Hx of dementia. Person in home believes pupils are normally unequal.

## 2021-05-31 NOTE — ED Provider Notes (Signed)
I assumed care at signout to follow-up on MRI imaging.  There was concern the patient had a TIA but his history was very limited. MRI imaging is negative.  Patient is awake and alert and moves all extremities.  I was able to speak to his caregiver by phone. She reports he was acting differently and then later was found in the floor.  Patient has known history of seizures and this may have  triggered this episode. Will refer to [facility] for follow-up Patient is in no distress at this time is appropriate for discharge   [name], MD 05/31/21 [phone]

## 2021-09-14 ENCOUNTER — Observation Stay (HOSPITAL_COMMUNITY)
Admission: EM | Admit: 2021-09-14 | Discharge: 2021-09-15 | Disposition: A | Discharge status: Home / Self Care | Payer: Medicare Other | Attending: Internal Medicine | Admitting: Internal Medicine

## 2021-09-14 ENCOUNTER — Emergency Department (HOSPITAL_COMMUNITY): Payer: Medicare Other

## 2021-09-14 ENCOUNTER — Encounter (HOSPITAL_COMMUNITY): Payer: Self-pay | Admitting: Emergency Medicine

## 2021-09-14 ENCOUNTER — Observation Stay (HOSPITAL_COMMUNITY): Payer: Medicare Other

## 2021-09-14 ENCOUNTER — Other Ambulatory Visit: Payer: Self-pay

## 2021-09-14 DIAGNOSIS — Z7901 Long term (current) use of anticoagulants: Secondary | ICD-10-CM | POA: Insufficient documentation

## 2021-09-14 DIAGNOSIS — R569 Unspecified convulsions: Secondary | ICD-10-CM | POA: Diagnosis present

## 2021-09-14 DIAGNOSIS — E119 Type 2 diabetes mellitus without complications: Secondary | ICD-10-CM

## 2021-09-14 DIAGNOSIS — Z8673 Personal history of transient ischemic attack (TIA), and cerebral infarction without residual deficits: Secondary | ICD-10-CM | POA: Diagnosis not present

## 2021-09-14 DIAGNOSIS — J449 Chronic obstructive pulmonary disease, unspecified: Secondary | ICD-10-CM | POA: Insufficient documentation

## 2021-09-14 DIAGNOSIS — G40209 Localization-related (focal) (partial) symptomatic epilepsy and epileptic syndromes with complex partial seizures, not intractable, without status epilepticus: Secondary | ICD-10-CM | POA: Diagnosis not present

## 2021-09-14 DIAGNOSIS — G8384 Todd's paralysis (postepileptic): Secondary | ICD-10-CM | POA: Diagnosis not present

## 2021-09-14 DIAGNOSIS — I482 Chronic atrial fibrillation, unspecified: Secondary | ICD-10-CM | POA: Insufficient documentation

## 2021-09-14 DIAGNOSIS — I10 Essential (primary) hypertension: Secondary | ICD-10-CM | POA: Insufficient documentation

## 2021-09-14 DIAGNOSIS — E785 Hyperlipidemia, unspecified: Secondary | ICD-10-CM | POA: Diagnosis not present

## 2021-09-14 DIAGNOSIS — Y900 Blood alcohol level of less than 20 mg/100 ml: Secondary | ICD-10-CM | POA: Insufficient documentation

## 2021-09-14 DIAGNOSIS — F17210 Nicotine dependence, cigarettes, uncomplicated: Secondary | ICD-10-CM | POA: Insufficient documentation

## 2021-09-14 DIAGNOSIS — Z79899 Other long term (current) drug therapy: Secondary | ICD-10-CM | POA: Insufficient documentation

## 2021-09-14 DIAGNOSIS — Z20822 Contact with and (suspected) exposure to covid-19: Secondary | ICD-10-CM | POA: Diagnosis not present

## 2021-09-14 HISTORY — DX: Unspecified convulsions: R56.9

## 2021-09-14 LAB — APTT: aPTT: 29 seconds (ref 24–36)

## 2021-09-14 LAB — COMPREHENSIVE METABOLIC PANEL
ALT: 10 U/L (ref 0–44)
AST: 19 U/L (ref 15–41)
Albumin: 3.5 g/dL (ref 3.5–5.0)
Alkaline Phosphatase: 81 U/L (ref 38–126)
Anion gap: 8 (ref 5–15)
BUN: 22 mg/dL (ref 8–23)
CO2: 24 mmol/L (ref 22–32)
Calcium: 8.6 mg/dL — ABNORMAL LOW (ref 8.9–10.3)
Chloride: 103 mmol/L (ref 98–111)
Creatinine, Ser: 1.51 mg/dL — ABNORMAL HIGH (ref 0.61–1.24)
GFR, Estimated: 46 mL/min — ABNORMAL LOW (ref >60)
Glucose, Bld: 110 mg/dL — ABNORMAL HIGH (ref 70–99)
Potassium: 3.8 mmol/L (ref 3.5–5.1)
Sodium: 135 mmol/L (ref 135–145)
Total Bilirubin: 0.8 mg/dL (ref 0.3–1.2)
Total Protein: 6.1 g/dL — ABNORMAL LOW (ref 6.5–8.1)

## 2021-09-14 LAB — DIFFERENTIAL
Abs Immature Granulocytes: 0.1 10*3/uL — ABNORMAL HIGH (ref 0.00–0.07)
Basophils Absolute: 0.1 10*3/uL (ref 0.0–0.1)
Basophils Relative: 1 %
Eosinophils Absolute: 0.1 10*3/uL (ref 0.0–0.5)
Eosinophils Relative: 1 %
Immature Granulocytes: 1 %
Lymphocytes Relative: 6 %
Lymphs Abs: 0.7 10*3/uL (ref 0.7–4.0)
Monocytes Absolute: 0.4 10*3/uL (ref 0.1–1.0)
Monocytes Relative: 4 %
Neutro Abs: 9.5 10*3/uL — ABNORMAL HIGH (ref 1.7–7.7)
Neutrophils Relative %: 87 %

## 2021-09-14 LAB — RESP PANEL BY RT-PCR (FLU A&B, COVID) ARPGX2
Influenza A by PCR: NEGATIVE
Influenza B by PCR: NEGATIVE
SARS Coronavirus 2 by RT PCR: NEGATIVE

## 2021-09-14 LAB — URINALYSIS, ROUTINE W REFLEX MICROSCOPIC
Bilirubin Urine: NEGATIVE
Glucose, UA: NEGATIVE mg/dL
Ketones, ur: NEGATIVE mg/dL
Leukocytes,Ua: NEGATIVE
Nitrite: NEGATIVE
Protein, ur: NEGATIVE mg/dL
Specific Gravity, Urine: <1.005 — ABNORMAL LOW (ref 1.005–1.030)
pH: 6 (ref 5.0–8.0)

## 2021-09-14 LAB — CBC
HCT: 40.5 % (ref 39.0–52.0)
Hemoglobin: 13.6 g/dL (ref 13.0–17.0)
MCH: 32.9 pg (ref 26.0–34.0)
MCHC: 33.6 g/dL (ref 30.0–36.0)
MCV: 98.1 fL (ref 80.0–100.0)
Platelets: 221 10*3/uL (ref 150–400)
RBC: 4.13 MIL/uL — ABNORMAL LOW (ref 4.22–5.81)
RDW: 12.7 % (ref 11.5–15.5)
WBC: 10.8 10*3/uL — ABNORMAL HIGH (ref 4.0–10.5)
nRBC: 0 % (ref 0.0–0.2)

## 2021-09-14 LAB — RAPID URINE DRUG SCREEN, HOSP PERFORMED
Amphetamines: NOT DETECTED
Barbiturates: NOT DETECTED
Benzodiazepines: POSITIVE — AB
Cocaine: NOT DETECTED
Opiates: NOT DETECTED
Tetrahydrocannabinol: NOT DETECTED

## 2021-09-14 LAB — CBG MONITORING, ED: Glucose-Capillary: 137 mg/dL — ABNORMAL HIGH (ref 70–99)

## 2021-09-14 LAB — ETHANOL: Alcohol, Ethyl (B): <10 mg/dL (ref <10)

## 2021-09-14 LAB — HEMOGLOBIN A1C
Hgb A1c MFr Bld: 5.4 % (ref 4.8–5.6)
Mean Plasma Glucose: 108.28 mg/dL

## 2021-09-14 LAB — URINALYSIS, MICROSCOPIC (REFLEX): Bacteria, UA: NONE SEEN

## 2021-09-14 LAB — PROTIME-INR
INR: 1 (ref 0.8–1.2)
Prothrombin Time: 13.1 seconds (ref 11.4–15.2)

## 2021-09-14 MED ORDER — SODIUM CHLORIDE 0.9 % IV BOLUS
500.0000 mL | Freq: Once | INTRAVENOUS | Status: AC
  Administered 2021-09-14: 21:00:00 500 mL via INTRAVENOUS

## 2021-09-14 MED ORDER — IOHEXOL 350 MG/ML SOLN
150.0000 mL | Freq: Once | INTRAVENOUS | Status: AC | PRN
  Administered 2021-09-14: 20:00:00 150 mL via INTRAVENOUS

## 2021-09-14 MED ORDER — SODIUM CHLORIDE 0.9 % IV SOLN
75.0000 mL/h | INTRAVENOUS | Status: DC
  Administered 2021-09-14: 23:00:00 75 mL/h via INTRAVENOUS

## 2021-09-14 MED ORDER — LEVETIRACETAM 750 MG PO TABS
750.0000 mg | ORAL_TABLET | Freq: Two times a day (BID) | ORAL | Status: DC
  Administered 2021-09-15: 11:00:00 750 mg via ORAL
  Filled 2021-09-14 (×3): qty 1

## 2021-09-14 MED ORDER — SODIUM CHLORIDE 0.9 % IV SOLN
100.0000 mL/h | INTRAVENOUS | Status: DC
  Administered 2021-09-14: 21:00:00 100 mL/h via INTRAVENOUS

## 2021-09-14 MED ORDER — CHLORHEXIDINE GLUCONATE 0.12% ORAL RINSE (MEDLINE KIT): 15.0000 mL | Freq: Two times a day (BID) | OROMUCOSAL | Status: DC

## 2021-09-14 MED ORDER — APIXABAN 5 MG PO TABS
5.0000 mg | ORAL_TABLET | Freq: Two times a day (BID) | ORAL | Status: DC
  Administered 2021-09-14 – 2021-09-15 (×2): 5 mg via ORAL
  Filled 2021-09-14 (×2): qty 1

## 2021-09-14 MED ORDER — LISINOPRIL 10 MG PO TABS
5.0000 mg | ORAL_TABLET | Freq: Every day | ORAL | Status: DC
  Administered 2021-09-14: 23:00:00 5 mg via ORAL
  Filled 2021-09-14: qty 1

## 2021-09-14 MED ORDER — ORAL CARE MOUTH RINSE: 15.0000 mL | OROMUCOSAL | Status: DC

## 2021-09-14 MED ORDER — ATORVASTATIN CALCIUM 10 MG PO TABS
20.0000 mg | ORAL_TABLET | Freq: Every day | ORAL | Status: DC
  Administered 2021-09-15: 11:00:00 20 mg via ORAL
  Filled 2021-09-14: qty 2

## 2021-09-14 MED ORDER — LEVETIRACETAM IN NACL 1500 MG/100ML IV SOLN
1500.0000 mg | Freq: Once | INTRAVENOUS | Status: AC
  Administered 2021-09-14: 21:00:00 1500 mg via INTRAVENOUS
  Filled 2021-09-14: qty 100

## 2021-09-14 NOTE — ED Notes (Signed)
Patient currently at MRI

## 2021-09-14 NOTE — ED Triage Notes (Signed)
Patient arrived with EMS from home reports multiple episodes of seizures this evening , he received Versed 5 mg IM by EMS prior to arrival , Code stroke activated by EDP at arrival due to left sided weakness. LSN 1850 this evening .

## 2021-09-14 NOTE — ED Notes (Signed)
Returned to Trauma B.

## 2021-09-14 NOTE — Code Documentation (Signed)
Stroke Response Nurse Documentation Code Documentation  Blake [name] is a 80 y.o. male arriving to [facility] ED via [facility] on 09/14/2021 with past medical hx of seizures, copd, etoh, AF. On Eliquis (apixaban) daily. Code stroke was activated by ED.   Patient from home where he was LKW at 1850 and now complaining of Left side weakness and vision deficit. Patient with multiple seizures, versed given prior to arrival.    Stroke team at the bedside on patient arrival. Labs drawn and patient cleared for CT by Dr. Gilford Raid. Patient to CT with team. NIHSS 15, see documentation for details and code stroke times. Patient with right gaze preference , left hemianopia, left arm weakness, left leg weakness, left decreased sensation, and left neglect on exam. The following imaging was completed: CT, CTA head and neck. Patient is not a candidate for IV Thrombolytic due to Eliquis dose today. Patient is not a candidate for IR due to no LVO on CTA.   Care/Plan: Modified NIH q2 and VS q2.   Bedside handoff with ED RN Mortimer Fries.    Raliegh Ip  Stroke Response RN

## 2021-09-14 NOTE — H&P (Signed)
History and Physical    Blake [name] OXB:[id number] DOB: 05-29-1941 DOA: 09/14/2021  PCP: Patient, No Pcp Per (Inactive)  Patient coming from: Home  I have personally briefly reviewed patient's old medical records in [place]  Chief Complaint: Seizure  HPI: Blake [name] is a 80 y.o. male with medical history significant for CVA, seizure, COPD, type 2 diabetes, hypertension, chronic atrial fibrillation on Eliquis, hyperlipidemia who presents with concerns of seizure.  Patient lives at home and is cared for by a friend who lives with him.  Reportedly he has a camera in his room to observe him.  He was found to be on the floor on camera.  Friend went to check in on him and noticed 2 separate episodes of left-sided jerking.  EMS was called and he was noted to be weak on the left side with left hemianopsia and was brought in the ED.  Initially brought in as a code stroke with negative MRI. No LVO on CTA head and neck.   Patient has been compliant with his Eliquis and Keppra. Reportedly, patient has heavy alcohol use but has been cutting down.  ED Course: He was afebrile, normotensive room air.  WBC of 10.8, normal hemoglobin.  Sodium of 135, K of 3.8, creatinine of 1.51 around baseline, BG of 110.  EtOH of less than 10  Patient was evaluated by neurology since he presented as a code stroke.  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  He was loaded on 1500 IV Keppra and was recommended to increase maintenance Keppra to 750 mg BID. Hospitalist then called for admission.  Review of Systems: Pt was a poor historian and appears disoriented. Only reports headache and denies feeling unwell prior to today.  Past Medical History:  Diagnosis Date   Chronic insomnia    Cigarette smoker    COPD (chronic obstructive pulmonary disease) (HCC)    Diverticulosis of colon    Elevated prostate specific antigen (PSA)    ETOH abuse    GERD (gastroesophageal  reflux disease)    Hx of colonic polyps    Hyperlipidemia    Hypertension    Pulmonary nodule    Seizures (HCC)    Shortness of breath    Stroke [facility])     Past Surgical History:  Procedure Laterality Date   COLONOSCOPY     ESOPHAGOGASTRODUODENOSCOPY (EGD) WITH PROPOFOL N/A 07/21/2018   Procedure: ESOPHAGOGASTRODUODENOSCOPY (EGD) WITH PROPOFOL;  Surgeon: Lavena Bullion, DO;  Location: [place];  Service: Gastroenterology;  Laterality: N/A;   needle biopsy RUL nodule  01/2009   benign   TONSILLECTOMY  1947   VASECTOMY       reports that he has been smoking cigarettes. He has a 55.00 pack-year smoking history. He has never used smokeless tobacco. He reports current alcohol use of about 24.0 standard drinks per week. He reports that he does not use drugs. Social History  No Known Allergies  Family History  Problem Relation Age of Onset   Diabetes Father    Lung cancer Brother    Colon cancer Neg Hx    Esophageal cancer Neg Hx    Stomach cancer Neg Hx      Prior to Admission medications   Medication Sig Start Date End Date Taking? Authorizing Provider  furosemide (LASIX) 40 MG tablet Take 40 mg by mouth daily as needed for fluid or edema.   Yes [provider]  levETIRAcetam (KEPPRA) 500 MG tablet  Take 1 tablet (500 mg total) by mouth 2 (two) times daily. Patient taking differently: Take 500 mg by mouth in the morning and at bedtime. 04/23/20 10/15/21 Yes Arrien, Jimmy Picket, MD  lisinopril (ZESTRIL) 5 MG tablet Take 1 tablet (5 mg total) by mouth daily. Patient taking differently: Take 5 mg by mouth at bedtime. 04/23/20 10/15/21 Yes Arrien, Jimmy Picket, MD  apixaban (ELIQUIS) 5 MG TABS tablet Take 1 tablet (5 mg total) by mouth 2 (two) times daily. Patient not taking: Reported on 09/14/2021 04/23/20 09/14/21  Arrien, Jimmy Picket, MD  atorvastatin (LIPITOR) 20 MG tablet Take 1 tablet (20 mg total) by mouth daily. Patient not taking: Reported on  09/14/2021 04/23/20 09/14/21  Arrien, Jimmy Picket, MD  Cholecalciferol (VITAMIN D-3) 25 MCG (1000 UT) CAPS Take 1 capsule (1,000 Units total) by mouth daily. Patient not taking: Reported on 09/14/2021 08/11/18   Nickola Major, NP    Physical Exam: Vitals:   09/14/21 2114 09/14/21 2115 09/14/21 2130 09/14/21 2145  BP:  (!) 175/90 (!) 163/118 (!) 156/88  Pulse:  86 76 70  Resp:  (!) 7 17 17   Temp:      TempSrc:      SpO2:  98% 97% 98%  Weight: 65 kg     Height: 5\' 11"  (1.803 m)       Constitutional: NAD, calm, comfortable, elderly male laying flat in bed asleep Vitals:   09/14/21 2114 09/14/21 2115 09/14/21 2130 09/14/21 2145  BP:  (!) 175/90 (!) 163/118 (!) 156/88  Pulse:  86 76 70  Resp:  (!) 7 17 17   Temp:      TempSrc:      SpO2:  98% 97% 98%  Weight: 65 kg     Height: 5\' 11"  (1.803 m)      Eyes: lids and conjunctivae normal ENMT: Mucous membranes are moist.  Neck: normal, supple Respiratory: clear to auscultation bilaterally, no wheezing, no crackles. Normal respiratory effort. No accessory muscle use.  Cardiovascular: Regular rate and rhythm, no murmurs / rubs / gallops. No extremity edema.  Abdomen: no tenderness, Bowel sounds positive.  Musculoskeletal: no clubbing / cyanosis. No joint deformity upper and lower extremities.  Skin: no rashes, lesions, ulcers. No induration Neurologic: CN 2-12 grossly intact.  Strength 5/5 in all 4.  Preferential right-sided gaze. Psychiatric: Alert and oriented only to self and place    Labs on Admission: I have personally reviewed following labs and imaging studies  CBC: Recent Labs  Lab 09/14/21 1951  WBC 10.8*  NEUTROABS 9.5*  HGB 13.6  HCT 40.5  MCV 98.1  PLT 017   Basic Metabolic Panel: Recent Labs  Lab 09/14/21 1951  NA 135  K 3.8  CL 103  CO2 24  GLUCOSE 110*  BUN 22  CREATININE 1.51*  CALCIUM 8.6*   GFR: Estimated Creatinine Clearance: 35.9 mL/min (A) (by C-G formula based on SCr of 1.51  mg/dL (H)). Liver Function Tests: Recent Labs  Lab 09/14/21 1951  AST 19  ALT 10  ALKPHOS 81  BILITOT 0.8  PROT 6.1*  ALBUMIN 3.5   No results for input(s): LIPASE, AMYLASE in the last 168 hours. No results for input(s): AMMONIA in the last 168 hours. Coagulation Profile: Recent Labs  Lab 09/14/21 1951  INR 1.0   Cardiac Enzymes: No results for input(s): CKTOTAL, CKMB, CKMBINDEX, TROPONINI in the last 168 hours. BNP (last 3 results) No results for input(s): PROBNP in the last 8760 hours. HbA1C: No results  for input(s): HGBA1C in the last 72 hours. CBG: Recent Labs  Lab 09/14/21 1926  GLUCAP 137*   Lipid Profile: No results for input(s): CHOL, HDL, LDLCALC, TRIG, CHOLHDL, LDLDIRECT in the last 72 hours. Thyroid Function Tests: No results for input(s): TSH, T4TOTAL, FREET4, T3FREE, THYROIDAB in the last 72 hours. Anemia Panel: No results for input(s): VITAMINB12, FOLATE, FERRITIN, TIBC, IRON, RETICCTPCT in the last 72 hours. Urine analysis:    Component Value Date/Time   COLORURINE STRAW (A) 09/14/2021 2140   APPEARANCEUR CLEAR 09/14/2021 2140   LABSPEC <1.005 (L) 09/14/2021 2140   PHURINE 6.0 09/14/2021 2140   GLUCOSEU NEGATIVE 09/14/2021 2140   GLUCOSEU NEGATIVE 02/02/2011 1543   HGBUR TRACE (A) 09/14/2021 2140   BILIRUBINUR NEGATIVE 09/14/2021 2140   KETONESUR NEGATIVE 09/14/2021 2140   PROTEINUR NEGATIVE 09/14/2021 2140   UROBILINOGEN 1.0 10/14/2014 1211   NITRITE NEGATIVE 09/14/2021 2140   LEUKOCYTESUR NEGATIVE 09/14/2021 2140    Radiological Exams on Admission: MR BRAIN WO CONTRAST  Result Date: 09/14/2021 CLINICAL DATA:  Initial evaluation for neuro deficit, stroke suspected. EXAM: MRI HEAD WITHOUT CONTRAST TECHNIQUE: Multiplanar, multiecho pulse sequences of the brain and surrounding structures were obtained without intravenous contrast. COMPARISON:  Prior CTs from earlier the same day as well as previous MRI from 05/31/2021. FINDINGS: Brain:  Examination technically limited as the patient was unable to tolerate the full length of the exam. Diffusion-weighted sequences, axial FLAIR, and sagittal T1 weighted sequence only were performed. And axial SWI sequence is severely degraded by motion artifact, and nearly nondiagnostic. Mild age-related cerebral atrophy with chronic small vessel ischemic disease, stable. No abnormal foci of restricted diffusion to suggest acute or subacute ischemia. Gray-white differentiation maintained. No encephalomalacia to suggest chronic cortical infarction. No visible evidence for acute intracranial hemorrhage. No mass lesion, midline shift or mass effect. Stable ventricular size without hydrocephalus. No extra-axial fluid collection. Pituitary gland suprasellar region within normal limits. Midline structures intact. Vascular: Major intracranial vascular flow voids not well assessed on this limited exam. Skull and upper cervical spine: Craniocervical junction within normal limits. Bone marrow signal intensity normal. No scalp soft tissue abnormality. Sinuses/Orbits: Globes and orbital soft tissues grossly within normal limits. Paranasal sinuses are largely clear. Other: None. IMPRESSION: 1. Technically limited exam due to patient's inability to tolerate the full length of the study. 2. No acute intracranial infarct or other definite abnormality. 3. Mild age-related cerebral atrophy with chronic small vessel ischemic disease, stable. Electronically Signed   By: Jeannine Boga M.D.   On: 09/14/2021 21:22   CT HEAD CODE STROKE WO CONTRAST  Result Date: 09/14/2021 CLINICAL DATA:  Code stroke. Provided history: Neuro deficit, acute, stroke suspected. Additional history obtained from [place] seizure episodes this evening, left-sided weakness upon arrival. EXAM: CT HEAD WITHOUT CONTRAST TECHNIQUE: Contiguous axial images were obtained from the base of the skull through the vertex without  intravenous contrast. COMPARISON:  Brain MRI 06/20/2021. FINDINGS: Brain: Mildly motion degraded exam. Cerebral and cerebellar atrophy. Commensurate prominence of the ventricles and sulci. Mild-to-moderate patchy and ill-defined hypoattenuation within the cerebral white matter, nonspecific but compatible with chronic small vessel ischemic disease. There is no acute intracranial hemorrhage. No demarcated cortical infarct. No extra-axial fluid collection. No evidence of an intracranial mass. No midline shift. Vascular: No hyperdense vessel.  Atherosclerotic calcifications. Skull: Normal. Negative for fracture or focal lesion. Sinuses/Orbits: Visualized orbits show no acute finding. Surgical hardware versus cerclage wires along the left orbital rim. Trace scattered paranasal sinus mucosal  thickening at the imaged levels. ASPECTS [facility] Stroke Program Early CT Score) - Ganglionic level infarction (caudate, lentiform nuclei, internal capsule, insula, M1-M3 cortex): 7 - Supraganglionic infarction (M4-M6 cortex): 3 Total score (0-10 with 10 being normal): 10 No evidence of acute intracranial abnormality. These results were communicated to Dr. Lorrin Goodell At 7:50 pmon 12/19/2022by text page via the [facility] messaging system. IMPRESSION: Mildly motion degraded exam. No evidence of acute intracranial abnormality. Mild-to-moderate chronic small vessel ischemic changes within the cerebral white matter. Cerebral and cerebellar atrophy. Electronically Signed   By: Kellie Simmering D.O.   On: 09/14/2021 19:51   CT ANGIO HEAD NECK W WO CM (CODE STROKE)  Result Date: 09/14/2021 CLINICAL DATA:  Neuro deficit, acute, stroke suspected. EXAM: CT ANGIOGRAPHY HEAD AND NECK TECHNIQUE: Multidetector CT imaging of the head and neck was performed using the standard protocol during bolus administration of intravenous contrast. Multiplanar CT image reconstructions and MIPs were obtained to evaluate the vascular anatomy. Carotid stenosis  measurements (when applicable) are obtained utilizing NASCET criteria, using the distal internal carotid diameter as the denominator. CONTRAST:  Administered contrast not known at this time. COMPARISON:  Noncontrast head CT performed earlier today 09/14/2021. MRI brain, MRA head and MRA neck 05/31/2021. CT angiogram head/neck 03/06/2020. FINDINGS: CTA NECK FINDINGS Aortic arch: Standard aortic branching. Atherosclerotic plaque within the visualized aortic arch and proximal major branch vessels of the neck. No hemodynamically significant innominate or proximal subclavian artery stenosis. Right carotid system: CCA and ICA patent within the neck without significant stenosis (50% or greater). Moderate soft and calcified plaque about the carotid bifurcation and within the proximal ICA with less than 50% narrowing of the proximal ICA. Left carotid system: CCA and ICA patent within the neck without significant stenosis (50% or greater). Atherosclerotic plaque within the left carotid system. Most notably, there is moderate soft and calcified plaque about the carotid bifurcation and within the proximal ICA with resultant less than 50% stenosis of the proximal ICA. Vertebral arteries: Vertebral arteries patent within the neck. The right vertebral artery is dominant. Atherosclerotic plaque results in mild/moderate stenosis at the origin of the right vertebral artery, progressed. No more than mild atherosclerotic narrowing at the origin of the left vertebral artery, unchanged. Skeleton: Straightening of the expected cervical lordosis. Cervical spondylosis. No acute bony abnormality or aggressive osseous lesion. Other neck: 12 mm nodule within the left thyroid lobe, not meeting consensus criteria for ultrasound follow-up based on size. Subcutaneous lipoma within the posterior neck measuring 4.9 x 3.0 x 4.2 cm (AP x TV x CC) (for instance as seen on series 10, image 194) (series 12, image 98). Upper chest: No consolidation within  the imaged lung apices. Patchy foci of scarring within the bilateral upper lobes. Centrilobular emphysema. Review of the MIP images confirms the above findings CTA HEAD FINDINGS Anterior circulation: The intracranial internal carotid arteries are patent. Calcified plaque within both vessels with no more than mild stenosis. The M1 middle cerebral arteries are patent. No M2 proximal branch occlusion or high-grade proximal stenosis is identified. The anterior cerebral arteries are patent. Both anterior cerebral arteries demonstrate distal branch atherosclerotic irregularity. No intracranial aneurysm is identified. Posterior circulation: The intracranial vertebral arteries are patent. The basilar artery is patent. Mild atherosclerotic irregularity of the basilar artery. There is occlusion versus near occlusive stenosis of a P4 right PCA branch not definitively present on the prior CTA head of 03/06/2020 or the MRA head of 05/31/2021. As before, there are sites of up to moderate  stenosis within the P2 right PCA. The left PCA is patent. As before, there is mild up to severe stenosis within the P2 left PCA. The right P1 segment is developmentally diminutive and there is a sizable right posterior communicating artery. The left posterior communicating artery is diminutive or absent. Venous sinuses: The majority of the superior sagittal sinus is excluded from the field of view. Within this limitation and within the limitations of contrast timing, there is no appreciable dural venous sinus thrombosis. Anatomic variants: As described. Review of the MIP images confirms the above findings These results were called by telephone at the time of interpretation on 09/14/2021 at 8:10 pm to provider [facility] , who verbally acknowledged these results. IMPRESSION: CTA neck: 1. The common carotid and internal carotid arteries are patent within the neck without hemodynamically significant stenosis. Atherosclerotic plaque about the  carotid bifurcations and within the proximal ICAs, with resultant less than 50% stenosis within the proximal ICAs. 2. Vertebral arteries patent within the neck. Mild/moderate atherosclerotic narrowing at the origin of the dominant right vertebral artery, slightly progressed from the CTA neck of 03/06/2020. No more than mild stenosis at the origin of the non-dominant left vertebral artery, unchanged. 3. Aortic Atherosclerosis (ICD10-I70.0) and Emphysema (ICD10-J43.9). 4. 4.9 cm subcutaneous lipoma within the posterior neck. 5. Cervical spondylosis. CTA head: 1. Occlusion versus near-occlusive stenosis of a P4 right PCA branch vessel, a finding not definitively present on the prior CTA head/neck of 03/06/2020 or the MRA head of 05/31/2021. Severe stenosis within the same right PCA branch just proximal to this. 2. As before, there are sites of up to moderate stenosis within the P2 right PCA. 3. As before, there are sites of up to severe stenosis within the P2 left PCA. 4. No anterior circulation large vessel occlusion is identified. 5. Calcified plaque within the intracranial ICAs with no more than mild stenosis. 6. Bilateral ACA distal branch atherosclerotic irregularity. Electronically Signed   By: Kellie Simmering D.O.   On: 09/14/2021 20:34      Assessment/Plan  Recurrent seizures  -Negative stroke workup with CT, CT head and neck, and MRI -left sided weakness thought secondary to Todd's paralysis -Neurology has loaded IV 1500 Keppra and recommends increase of maintenance Keppra to 750mg  BID  -encourage to quit alcohol  -Seizure precaution -No signs suggestive of infection to lower his seizure threshold.  WBC is mildly elevated 10.9 but UA is negative. obtain CXR.   HTN continue Lisinopril  Hx of CVA on Eliquis   Type 2 diabetes no hyperglycemia -repeat HbA1C   Chronic Atrial fibrillation - Continue Eliquis  Hyperlipidemia Unclear but med rec reports not taking statin -resume atorvastain    DVT prophylaxis:.Eliquis Code Status: Full Family Communication: No family at bedside  disposition Plan: Home with observation Consults called:  Admission status: Observation Level of care: Telemetry Medical  Status is: Observation  The patient remains OBS appropriate and will d/c before 2 midnights.        Orene Desanctis DO [facility]   If 7PM-7AM, please contact night-coverage [email or web address]   09/14/2021, 10:32 PM

## 2021-09-14 NOTE — Consult Note (Signed)
NEUROLOGY CONSULTATION NOTE   Date of service: September 14, 2021 Patient Name: Blake [name] MRN:  [id number] DOB:  10/08/1940 Reason for consult: "L hemiplegia and vision deficit" Requesting Provider: Isla Pence, MD _ _ _   _ __   _ __ _ _  __ __   _ __   __ _  History of Present Illness  Blake [name] is a 80 y.o. male with PMH significant for insomnia, COPD, prior EtOH abuse but significantly cut down on EtOh use, history of A. fib on Eliquis who presents with seizures followed by left sided weakness and vision deficit.  Patient lives with his friend Conley Delisle who is also his HCPOA. They have a camera in his room to observe him. Found that he was on the floor on the camera. Went in to check on him and noticed Left side was jerking. It resolved and he seemed to be doing good before he again went into left sided jerking and then had another one.  EMS called and he was noted to be weak on the left side with left hemianopsia and he was brought into the ED.  In the ED, a code stroke was activated given his prior history of strokes and atrial fibrillation.  Patient has been compliant with his Eliquis and Keppra over the last couple months and rarely ever misses a dose. HCPOA reports that patient has significantly cut down on alcohol intake. They got him a 12 pack of beer about 3 weeks ago and still had about 3 beers left.  On initial evaluation, patient is plegic in the left upper extremity and left lower extremity along with left hemianopsia and gaze preference to the right.  Symptoms improved somewhat after CT scan and he was noted to be moving his left upper extremity and left lower extremity but still had gaze preference and left hemianopsia.   mRS: 3. Needs help with getting in and out of shower, needs some help with dressing and undressing himself. Walks by himeself and will go out to smoke a cigarette. tNKASE: not offered, took elieuiq in the evening today. Thrombectomy: not  offered, no LVO. NIHSS components Score: Comment  1a Level of Conscious 0[x]  1[]  2[]  3[]      1b LOC Questions 0[x]  1[]  2[]       1c LOC Commands 0[x]  1[]  2[]       2 Best Gaze 0[]  1[x]  2[]       3 Visual 0[]  1[]  2[x]  3[]      4 Facial Palsy 0[x]  1[]  2[]  3[]      5a Motor Arm - left 0[]  1[]  2[]  3[]  4[x]  UN[]    5b Motor Arm - Right 0[x]  1[]  2[]  3[]  4[]  UN[]    6a Motor Leg - Left 0[]  1[]  2[]  3[]  4[x]  UN[]    6b Motor Leg - Right 0[x]  1[]  2[]  3[]  4[]  UN[]    7 Limb Ataxia 0[x]  1[]  2[]  3[]  UN[]     8 Sensory 0[]  1[]  2[x]  UN[]      9 Best Language 0[x]  1[]  2[]  3[]      10 Dysarthria 0[x]  1[]  2[]  UN[]      11 Extinct. and Inattention 0[]  1[]  2[x]       TOTAL: 15       ROS   Constitutional Denies weight loss, fever and chills.   HEENT Denies changes in vision and hearing.   Respiratory Denies SOB and cough.   CV Denies palpitations and CP   GI Denies abdominal pain, nausea, vomiting and diarrhea.  GU Denies dysuria and urinary frequency.   MSK Denies myalgia and joint pain.   Skin Denies rash and pruritus.   Neurological Denies headache and syncope.   Psychiatric Denies recent changes in mood. Denies anxiety and depression.    Past History   Past Medical History:  Diagnosis Date   Chronic insomnia    Cigarette smoker    COPD (chronic obstructive pulmonary disease) (HCC)    Diverticulosis of colon    Elevated prostate specific antigen (PSA)    ETOH abuse    GERD (gastroesophageal reflux disease)    Hx of colonic polyps    Hyperlipidemia    Hypertension    Pulmonary nodule    Seizures (HCC)    Shortness of breath    Stroke [facility])    Past Surgical History:  Procedure Laterality Date   COLONOSCOPY     ESOPHAGOGASTRODUODENOSCOPY (EGD) WITH PROPOFOL N/A 07/21/2018   Procedure: ESOPHAGOGASTRODUODENOSCOPY (EGD) WITH PROPOFOL;  Surgeon: Lavena Bullion, DO;  Location: [place];  Service: Gastroenterology;  Laterality: N/A;   needle biopsy RUL nodule  01/2009   benign    TONSILLECTOMY  1945-12-19   VASECTOMY     Family History  Problem Relation Age of Onset   Diabetes Father    Lung cancer Brother    Colon cancer Neg Hx    Esophageal cancer Neg Hx    Stomach cancer Neg Hx    Social History   Socioeconomic History   Marital status: Widowed    Spouse name: Mary(deceased December 20, 1998)   Number of children: 1   Years of education: Not on file   Highest education level: Not on file  Occupational History   Occupation: retired    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[employer] , Pharmacist, community  Tobacco Use   Smoking status: Every Day    Packs/day: 1.00    Years: 55.00    Pack years: 55.00    Types: Cigarettes   Smokeless tobacco: Never   Tobacco comments:    not smoking @ SNF  Substance and Sexual Activity   Alcohol use: Yes    Alcohol/week: 24.0 standard drinks    Types: 24 Cans of beer per week    Comment: 3-4 daily   Drug use: No   Sexual activity: Never  Other Topics Concern   Not on file  Social History Narrative   1 son that died in a MVA   Social Determinants of Health   Financial Resource Strain: Not on file  Food Insecurity: Not on file  Transportation Needs: Not on file  Physical Activity: Not on file  Stress: Not on file  Social Connections: Not on file   No Known Allergies  Medications  (Not in a hospital admission)    Vitals   Vitals:   09/14/21 1930 09/14/21 1938  BP: 112/72   Pulse: 82   Resp: (!) 21   Temp:  (!) 95.9 F (35.5 C)  TempSrc:  Temporal  SpO2: 92%      There is no height or weight on file to calculate BMI.  Physical Exam   General: Laying comfortably in bed; in no acute distress.  HENT: Normal oropharynx and mucosa. Normal external appearance of ears and nose.  Neck: Supple, no pain or tenderness  CV: No JVD. No peripheral edema.  Pulmonary: Symmetric Chest rise. Normal respiratory effort.  Abdomen: Soft to touch, non-tender.  Ext: No cyanosis, edema, or deformity  Skin: No rash. Normal palpation of skin.   Musculoskeletal:  Normal digits  and nails by inspection. No clubbing.   Neurologic Examination  Mental status/Cognition: Alert, oriented to self, place, month and year, good attention.  Speech/language: Fluent, comprehension intact, object naming intact, repetition intact.  Cranial nerves:   CN II Pupils equal and reactive to light, left hemianopsia   CN III,IV,VI Right gaze preference, no nystagmus    CN V normal sensation in V1, V2, and V3 segments bilaterally   CN VII no asymmetry, no nasolabial fold flattening    CN VIII normal hearing to speech    CN IX & X normal palatal elevation, no uvular deviation    CN XI    CN XII midline tongue protrusion    Motor:  Muscle bulk: poor, tone normal. Mvmt Root Nerve  Muscle Right Left Comments  SA C5/6 Ax Deltoid 5 0   EF C5/6 Mc Biceps 5 0   EE C6/7/8 Rad Triceps 5 0   WF C6/7 Med FCR     WE C7/8 PIN ECU     F Ab C8/T1 U ADM/FDI 5 0   HF L1/2/3 Fem Illopsoas 5 0   KE L2/3/4 Fem Quad 5 0   DF L4/5 D Peron Tib Ant 5 1   PF S1/2 Tibial Grc/Sol 5 1    Reflexes:  Right Left Comments  Pectoralis      Biceps (C5/6) 2 2   Brachioradialis (C5/6) 2 2    Triceps (C6/7) 2 2    Patellar (L3/4) 2 2    Achilles (S1)      Hoffman      Plantar     Jaw jerk    Sensation:  Light touch Decreased in LUE and LLE   Pin prick    Temperature    Vibration   Proprioception    Coordination/Complex Motor:  - Finger to Nose intact on the right. - Heel to shin unable to do - Rapid alternating movement are slowed. - Gait: deferred for patient safety.  Labs   CBC: No results for input(s): WBC, NEUTROABS, HGB, HCT, MCV, PLT in the last 168 hours.  Basic Metabolic Panel:  Lab Results  Component Value Date   NA 133 (L) 05/30/2021   K 4.7 05/30/2021   CO2 24 05/30/2021   GLUCOSE 96 05/30/2021   BUN 14 05/30/2021   CREATININE 1.55 (H) 05/30/2021   CALCIUM 9.1 05/30/2021   GFRNONAA 45 (L) 05/30/2021   GFRAA 41 (L) 04/22/2020   Lipid Panel:  Lab Results   Component Value Date   LDLCALC 84 03/07/2020   HgbA1c:  Lab Results  Component Value Date   HGBA1C 5.3 03/07/2020   Urine Drug Screen:     Component Value Date/Time   LABOPIA NONE DETECTED 05/30/2021 2116   COCAINSCRNUR NONE DETECTED 05/30/2021 2116   LABBENZ NONE DETECTED 05/30/2021 2116   AMPHETMU NONE DETECTED 05/30/2021 2116   THCU NONE DETECTED 05/30/2021 2116   LABBARB NONE DETECTED 05/30/2021 2116    Alcohol Level     Component Value Date/Time   ETH <10 05/30/2021 2032    CT Head without contrast(Personally reviewed): CTH was negative for a large hypodensity concerning for a large territory infarct or hyperdensity concerning for an [place]  CT angio Head and Neck with contrast(Personally reviewed): No LVO on my read.  MRI Brain: pending  Impression   RASHED EDLER is a 80 y.o. male with PMH significant for insomnia, COPD, prior EtOH abuse but significantly cut down on EtOh use, history of A. fib on  Eliquis who has been compliant with Keppra and Eliquis. He presents with seizures followed by left sided weakness and vision deficit. Suspect that he had a partial seizure with secondary generalization and post ictal todd's palsy on the left.  Primary Diagnosis:  Partial seizure with secondary generalization.  Recommendations  - Observe overnight to monitor for seizure clustering and for improvement in symptoms. Was able to move his LUE after th CT scans but still with persistent vision deficit. - would consider cEEG if his left sided deficit does not resolve. Low clinical suspicion for persistent seizure activity at this time. - Keppra 1500mg  IV load once and increase Maintenance Keppra to 750mg  BID. - Metabolic workup with CBC, chemistry, infectious screen with UA, CXR. - No driving for 6 months. Has to be seizure free before he can resume driving. - Recommend quitting alcohol, discussed with HCPOA.  Plan discussed with Dr. Gilford Raid over secure chat and with patient's  HCPOA over phone. ______________________________________________________________________   Thank you for the opportunity to take part in the care of this patient. If you have any further questions, please contact the neurology consultation attending.  Signed,  [place] Pager Number [phone] _ _ _   _ __   _ __ _ _  __ __   _ __   __ _

## 2021-09-14 NOTE — ED Provider Notes (Addendum)
[place] EMERGENCY DEPARTMENT Provider Note   CSN: [id number] Arrival date & time: 09/14/21  9528  An emergency department physician performed an initial assessment on this suspected stroke patient at 40.  History Chief Complaint  Patient presents with   Seizures    Blake [name] is a 80 y.o. male with past medical history significant for CVA, seizure, COPD, DM2, HTN, atrial fibrillation on Eliquis, HLD, alcohol abuse who presents after seizure.  The patient was found by a friend who lives with him having a focal left-sided seizure.  EMS was called and when they arrived, the patient was still seizing.  They administered 5 mg of Versed IM with cessation of seizure activity.  He was postictal and hypoxic so he was placed on nonrebreather with improvement in SPO2.  He was noted to have left-sided weakness.  He was then brought to [facility] health for further evaluation and management.    Past Medical History:  Diagnosis Date   Chronic insomnia    Cigarette smoker    COPD (chronic obstructive pulmonary disease) (HCC)    Diverticulosis of colon    Elevated prostate specific antigen (PSA)    ETOH abuse    GERD (gastroesophageal reflux disease)    Hx of colonic polyps    Hyperlipidemia    Hypertension    Pulmonary nodule    Seizures (HCC)    Shortness of breath    Stroke [facility])     Patient Active Problem List   Diagnosis Date Noted   History of CVA (cerebrovascular accident) 09/14/2021   Atrial fibrillation, chronic ([place]) 09/14/2021   Alcohol withdrawal ([place]) 04/22/2020   Seizure ([place]) 04/20/2020   Leukocytosis 03/13/2020   Stroke-like symptoms s/p tPA - ? TIA vs aborted stroke vs seizure 03/06/2020   Acute CVA (cerebrovascular accident) ([place]) 01/10/2019   Esophageal stricture    Abnormal finding on GI tract imaging    Dysphagia 07/20/2018   Malnutrition of moderate degree 07/10/2018   Aspiration pneumonia ([place]) 07/07/2018   Seizure disorder ([place])  07/07/2018   HLD (hyperlipidemia) 07/07/2018   Acute hypoxemic respiratory failure ([place]) 07/07/2018   Encounter for intubation    Acute respiratory failure with hypercapnia ([place])    HCAP (healthcare-associated pneumonia)    Protein-calorie malnutrition, severe 06/26/2018   Alcohol withdrawal seizure ([place]) 06/25/2018   New onset seizure ([place]) 01/07/2018   Hypertension 01/07/2018   GERD (gastroesophageal reflux disease) 01/07/2018   Hyperlipidemia 01/07/2018   Cigarette smoker 01/07/2018   Alcohol abuse 01/07/2018   Acute alcohol abuse, with delirium ([place])    Acute encephalopathy 10/14/2014   Unsteady gait 10/14/2014   Alcohol withdrawal delirium ([place]) 10/14/2014   Lipoma of neck 12/22/2012   Cerebral atrophy ([place]) 09/28/2012   Cerebrovascular small vessel disease 09/28/2012   Left humeral fracture 07/04/2012   Delirium 07/04/2012   ETOH abuse    ELEVATED PROSTATE SPECIFIC ANTIGEN 09/30/2010   COPD (chronic obstructive pulmonary disease) ([place]) 01/28/2009   PULMONARY NODULE 01/28/2009   INSOMNIA, CHRONIC 10/15/2007   COLONIC POLYPS 10/10/2007   Type 2 diabetes mellitus ([place]) 10/10/2007   Dyslipidemia 10/10/2007   Essential hypertension 10/10/2007   GERD 10/10/2007   DIVERTICULOSIS OF COLON 10/10/2007    Past Surgical History:  Procedure Laterality Date   COLONOSCOPY     ESOPHAGOGASTRODUODENOSCOPY (EGD) WITH PROPOFOL N/A 07/21/2018   Procedure: ESOPHAGOGASTRODUODENOSCOPY (EGD) WITH PROPOFOL;  Surgeon: Lavena Bullion, DO;  Location: [facility];  Service: Gastroenterology;  Laterality: N/A;   needle biopsy RUL  nodule  01/2009   benign   TONSILLECTOMY  1947   VASECTOMY         Family History  Problem Relation Age of Onset   Diabetes Father    Lung cancer Brother    Colon cancer Neg Hx    Esophageal cancer Neg Hx    Stomach cancer Neg Hx     Social History   Tobacco Use   Smoking status: Every Day    Packs/day: 1.00    Years: 55.00    Pack years: 55.00     Types: Cigarettes   Smokeless tobacco: Never   Tobacco comments:    not smoking @ SNF  Substance Use Topics   Alcohol use: Yes    Alcohol/week: 24.0 standard drinks    Types: 24 Cans of beer per week    Comment: 3-4 daily   Drug use: No    Home Medications Prior to Admission medications   Medication Sig Start Date End Date Taking? Authorizing Provider  apixaban (ELIQUIS) 5 MG TABS tablet Take 1 tablet (5 mg total) by mouth 2 (two) times daily. 04/23/20 09/14/21 Yes Arrien, Jimmy Picket, MD  furosemide (LASIX) 40 MG tablet Take 40 mg by mouth daily as needed for fluid or edema.   Yes [provider]  levETIRAcetam (KEPPRA) 500 MG tablet Take 1 tablet (500 mg total) by mouth 2 (two) times daily. Patient taking differently: Take 500 mg by mouth in the morning and at bedtime. 04/23/20 10/15/21 Yes Arrien, Jimmy Picket, MD  lisinopril (ZESTRIL) 5 MG tablet Take 1 tablet (5 mg total) by mouth daily. Patient taking differently: Take 5 mg by mouth at bedtime. 04/23/20 10/15/21 Yes Arrien, Jimmy Picket, MD  atorvastatin (LIPITOR) 20 MG tablet Take 1 tablet (20 mg total) by mouth daily. Patient not taking: Reported on 09/14/2021 04/23/20 09/14/21  Arrien, Jimmy Picket, MD  Cholecalciferol (VITAMIN D-3) 25 MCG (1000 UT) CAPS Take 1 capsule (1,000 Units total) by mouth daily. Patient not taking: Reported on 09/14/2021 08/11/18   Medina-Vargas, Senaida Lange, NP    Allergies    Patient has no known allergies.  Review of Systems   Review of Systems  Unable to perform ROS: Mental status change (post-ictal)  Neurological:  Positive for seizures.   Physical Exam Updated Vital Signs BP (!) 161/72    Pulse 80    Temp (!) 97.4 F (36.3 C) (Temporal)    Resp 18    Ht $R'5\' 11"'az$  (1.803 m)    Wt 65 kg    SpO2 98%    BMI 19.99 kg/m   Physical Exam Vitals and nursing note reviewed.  Constitutional:      Appearance: He is well-developed and normal weight. He is ill-appearing.     Comments:  Lethargic but arousable  HENT:     Head: Normocephalic and atraumatic.     Right Ear: External ear normal.     Left Ear: External ear normal.     Nose: Nose normal.     Mouth/Throat:     Mouth: Mucous membranes are moist.     Pharynx: Oropharynx is clear.  Eyes:     Conjunctiva/sclera: Conjunctivae normal.     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     Pulses: Normal pulses.     Heart sounds: Normal heart sounds. No murmur heard. Pulmonary:     Effort: Pulmonary effort is normal. No  respiratory distress.     Breath sounds: Normal breath sounds. No wheezing, rhonchi or rales.  Abdominal:     Palpations: Abdomen is soft.     Tenderness: There is no abdominal tenderness.  Musculoskeletal:        General: No swelling.     Cervical back: Neck supple.  Skin:    General: Skin is warm and dry.     Capillary Refill: Capillary refill takes less than 2 seconds.  Neurological:     GCS: GCS eye subscore is 4. GCS verbal subscore is 4. GCS motor subscore is 6.     Cranial Nerves: Cranial nerves 2-12 are intact.     Motor: Weakness (LHB plegia) present.     Comments: He is lethargic, but arousable.  He is able to answer basic orientation questions, but is still somewhat confused and thinks the year is 2020. Follows commands in the right hemibody.  Does not follow commands or move the left hemibody spontaneously.  Withdraws to pain in the left hemibody.    ED Results / Procedures / Treatments   Labs (all labs ordered are listed, but only abnormal results are displayed) Labs Reviewed  CBC - Abnormal; Notable for the following components:      Result Value   WBC 10.8 (*)    RBC 4.13 (*)    All other components within normal limits  DIFFERENTIAL - Abnormal; Notable for the following components:   Neutro Abs 9.5 (*)    Abs Immature Granulocytes 0.10 (*)    All other components within normal limits   COMPREHENSIVE METABOLIC PANEL - Abnormal; Notable for the following components:   Glucose, Bld 110 (*)    Creatinine, Ser 1.51 (*)    Calcium 8.6 (*)    Total Protein 6.1 (*)    GFR, Estimated 46 (*)    All other components within normal limits  RAPID URINE DRUG SCREEN, HOSP PERFORMED - Abnormal; Notable for the following components:   Benzodiazepines POSITIVE (*)    All other components within normal limits  URINALYSIS, ROUTINE W REFLEX MICROSCOPIC - Abnormal; Notable for the following components:   Color, Urine STRAW (*)    Specific Gravity, Urine <1.005 (*)    Hgb urine dipstick TRACE (*)    All other components within normal limits  CBG MONITORING, ED - Abnormal; Notable for the following components:   Glucose-Capillary 137 (*)    All other components within normal limits  RESP PANEL BY RT-PCR (FLU A&B, COVID) ARPGX2  ETHANOL  PROTIME-INR  APTT  URINALYSIS, MICROSCOPIC (REFLEX)  HEMOGLOBIN A1C  LEVETIRACETAM LEVEL    EKG EKG Interpretation  Date/Time:  Monday September 14 2021 19:22:33 EST Ventricular Rate:  90 PR Interval:    QRS Duration: 101 QT Interval:  437 QTC Calculation: 535 R Axis:   -76 Text Interpretation: Accelerated junctional rhythm Inferior infarct, old Prolonged QT interval No significant change since last tracing Confirmed by Isla Pence [phone]) on 09/14/2021 7:48:59 PM  Radiology MR BRAIN WO CONTRAST  Result Date: 09/14/2021 CLINICAL DATA:  Initial evaluation for neuro deficit, stroke suspected. EXAM: MRI HEAD WITHOUT CONTRAST TECHNIQUE: Multiplanar, multiecho pulse sequences of the brain and surrounding structures were obtained without intravenous contrast. COMPARISON:  Prior CTs from earlier the same day as well as previous MRI from 05/31/2021. FINDINGS: Brain: Examination technically limited as the patient was unable to tolerate the full length of the exam. Diffusion-weighted sequences, axial FLAIR, and sagittal T1 weighted sequence only were  performed. And  axial SWI sequence is severely degraded by motion artifact, and nearly nondiagnostic. Mild age-related cerebral atrophy with chronic small vessel ischemic disease, stable. No abnormal foci of restricted diffusion to suggest acute or subacute ischemia. Gray-white differentiation maintained. No encephalomalacia to suggest chronic cortical infarction. No visible evidence for acute intracranial hemorrhage. No mass lesion, midline shift or mass effect. Stable ventricular size without hydrocephalus. No extra-axial fluid collection. Pituitary gland suprasellar region within normal limits. Midline structures intact. Vascular: Major intracranial vascular flow voids not well assessed on this limited exam. Skull and upper cervical spine: Craniocervical junction within normal limits. Bone marrow signal intensity normal. No scalp soft tissue abnormality. Sinuses/Orbits: Globes and orbital soft tissues grossly within normal limits. Paranasal sinuses are largely clear. Other: None. IMPRESSION: 1. Technically limited exam due to patient's inability to tolerate the full length of the study. 2. No acute intracranial infarct or other definite abnormality. 3. Mild age-related cerebral atrophy with chronic small vessel ischemic disease, stable. Electronically Signed   By: Jeannine Boga M.D.   On: 09/14/2021 21:22   DG CHEST PORT 1 VIEW  Result Date: 09/14/2021 CLINICAL DATA:  Seizure EXAM: PORTABLE CHEST 1 VIEW COMPARISON:  04/20/2020 FINDINGS: Lungs are well expanded, symmetric, and clear. No pneumothorax or pleural effusion. Cardiac size within normal limits. Pulmonary vascularity is normal. Osseous structures are age-appropriate. Multiple healed left rib fractures are noted. No acute bone abnormality. IMPRESSION: No active disease. Electronically Signed   By: Fidela Salisbury M.D.   On: 09/14/2021 23:05   CT HEAD CODE STROKE WO CONTRAST  Result Date: 09/14/2021 CLINICAL DATA:  Code stroke. Provided  history: Neuro deficit, acute, stroke suspected. Additional history obtained from [place] seizure episodes this evening, left-sided weakness upon arrival. EXAM: CT HEAD WITHOUT CONTRAST TECHNIQUE: Contiguous axial images were obtained from the base of the skull through the vertex without intravenous contrast. COMPARISON:  Brain MRI 06/20/2021. FINDINGS: Brain: Mildly motion degraded exam. Cerebral and cerebellar atrophy. Commensurate prominence of the ventricles and sulci. Mild-to-moderate patchy and ill-defined hypoattenuation within the cerebral white matter, nonspecific but compatible with chronic small vessel ischemic disease. There is no acute intracranial hemorrhage. No demarcated cortical infarct. No extra-axial fluid collection. No evidence of an intracranial mass. No midline shift. Vascular: No hyperdense vessel.  Atherosclerotic calcifications. Skull: Normal. Negative for fracture or focal lesion. Sinuses/Orbits: Visualized orbits show no acute finding. Surgical hardware versus cerclage wires along the left orbital rim. Trace scattered paranasal sinus mucosal thickening at the imaged levels. ASPECTS [facility] Stroke Program Early CT Score) - Ganglionic level infarction (caudate, lentiform nuclei, internal capsule, insula, M1-M3 cortex): 7 - Supraganglionic infarction (M4-M6 cortex): 3 Total score (0-10 with 10 being normal): 10 No evidence of acute intracranial abnormality. These results were communicated to Dr. Lorrin Goodell At 7:50 pmon 12/19/2022by text page via the [facility] messaging system. IMPRESSION: Mildly motion degraded exam. No evidence of acute intracranial abnormality. Mild-to-moderate chronic small vessel ischemic changes within the cerebral white matter. Cerebral and cerebellar atrophy. Electronically Signed   By: Kellie Simmering D.O.   On: 09/14/2021 19:51   CT ANGIO HEAD NECK W WO CM (CODE STROKE)  Result Date: 09/14/2021 CLINICAL DATA:  Neuro deficit, acute, stroke  suspected. EXAM: CT ANGIOGRAPHY HEAD AND NECK TECHNIQUE: Multidetector CT imaging of the head and neck was performed using the standard protocol during bolus administration of intravenous contrast. Multiplanar CT image reconstructions and MIPs were obtained to evaluate the vascular anatomy. Carotid stenosis measurements (when applicable) are obtained utilizing NASCET  criteria, using the distal internal carotid diameter as the denominator. CONTRAST:  Administered contrast not known at this time. COMPARISON:  Noncontrast head CT performed earlier today 09/14/2021. MRI brain, MRA head and MRA neck 05/31/2021. CT angiogram head/neck 03/06/2020. FINDINGS: CTA NECK FINDINGS Aortic arch: Standard aortic branching. Atherosclerotic plaque within the visualized aortic arch and proximal major branch vessels of the neck. No hemodynamically significant innominate or proximal subclavian artery stenosis. Right carotid system: CCA and ICA patent within the neck without significant stenosis (50% or greater). Moderate soft and calcified plaque about the carotid bifurcation and within the proximal ICA with less than 50% narrowing of the proximal ICA. Left carotid system: CCA and ICA patent within the neck without significant stenosis (50% or greater). Atherosclerotic plaque within the left carotid system. Most notably, there is moderate soft and calcified plaque about the carotid bifurcation and within the proximal ICA with resultant less than 50% stenosis of the proximal ICA. Vertebral arteries: Vertebral arteries patent within the neck. The right vertebral artery is dominant. Atherosclerotic plaque results in mild/moderate stenosis at the origin of the right vertebral artery, progressed. No more than mild atherosclerotic narrowing at the origin of the left vertebral artery, unchanged. Skeleton: Straightening of the expected cervical lordosis. Cervical spondylosis. No acute bony abnormality or aggressive osseous lesion. Other neck:  12 mm nodule within the left thyroid lobe, not meeting consensus criteria for ultrasound follow-up based on size. Subcutaneous lipoma within the posterior neck measuring 4.9 x 3.0 x 4.2 cm (AP x TV x CC) (for instance as seen on series 10, image 194) (series 12, image 98). Upper chest: No consolidation within the imaged lung apices. Patchy foci of scarring within the bilateral upper lobes. Centrilobular emphysema. Review of the MIP images confirms the above findings CTA HEAD FINDINGS Anterior circulation: The intracranial internal carotid arteries are patent. Calcified plaque within both vessels with no more than mild stenosis. The M1 middle cerebral arteries are patent. No M2 proximal branch occlusion or high-grade proximal stenosis is identified. The anterior cerebral arteries are patent. Both anterior cerebral arteries demonstrate distal branch atherosclerotic irregularity. No intracranial aneurysm is identified. Posterior circulation: The intracranial vertebral arteries are patent. The basilar artery is patent. Mild atherosclerotic irregularity of the basilar artery. There is occlusion versus near occlusive stenosis of a P4 right PCA branch not definitively present on the prior CTA head of 03/06/2020 or the MRA head of 05/31/2021. As before, there are sites of up to moderate stenosis within the P2 right PCA. The left PCA is patent. As before, there is mild up to severe stenosis within the P2 left PCA. The right P1 segment is developmentally diminutive and there is a sizable right posterior communicating artery. The left posterior communicating artery is diminutive or absent. Venous sinuses: The majority of the superior sagittal sinus is excluded from the field of view. Within this limitation and within the limitations of contrast timing, there is no appreciable dural venous sinus thrombosis. Anatomic variants: As described. Review of the MIP images confirms the above findings These results were called by  telephone at the time of interpretation on 09/14/2021 at 8:10 pm to provider [facility] , who verbally acknowledged these results. IMPRESSION: CTA neck: 1. The common carotid and internal carotid arteries are patent within the neck without hemodynamically significant stenosis. Atherosclerotic plaque about the carotid bifurcations and within the proximal ICAs, with resultant less than 50% stenosis within the proximal ICAs. 2. Vertebral arteries patent within the neck. Mild/moderate atherosclerotic narrowing at  the origin of the dominant right vertebral artery, slightly progressed from the CTA neck of 03/06/2020. No more than mild stenosis at the origin of the non-dominant left vertebral artery, unchanged. 3. Aortic Atherosclerosis (ICD10-I70.0) and Emphysema (ICD10-J43.9). 4. 4.9 cm subcutaneous lipoma within the posterior neck. 5. Cervical spondylosis. CTA head: 1. Occlusion versus near-occlusive stenosis of a P4 right PCA branch vessel, a finding not definitively present on the prior CTA head/neck of 03/06/2020 or the MRA head of 05/31/2021. Severe stenosis within the same right PCA branch just proximal to this. 2. As before, there are sites of up to moderate stenosis within the P2 right PCA. 3. As before, there are sites of up to severe stenosis within the P2 left PCA. 4. No anterior circulation large vessel occlusion is identified. 5. Calcified plaque within the intracranial ICAs with no more than mild stenosis. 6. Bilateral ACA distal branch atherosclerotic irregularity. Electronically Signed   By: Kellie Simmering D.O.   On: 09/14/2021 20:34    Procedures Procedures   Medications Ordered in ED Medications  sodium chloride 0.9 % bolus 500 mL (0 mLs Intravenous Stopped 09/14/21 2131)    Followed by  0.9 %  sodium chloride infusion (0 mL/hr Intravenous Stopped 09/14/21 2248)  chlorhexidine gluconate (MEDLINE KIT) (PERIDEX) 0.12 % solution 15 mL (15 mLs Mouth Rinse Not Given 09/14/21 2249)  MEDLINE  mouth rinse (15 mLs Mouth Rinse Not Given 09/14/21 2337)  0.9 %  sodium chloride infusion (75 mL/hr Intravenous New Bag/Given 09/14/21 2249)  lisinopril (ZESTRIL) tablet 5 mg (5 mg Oral Given 09/14/21 2317)  apixaban (ELIQUIS) tablet 5 mg (5 mg Oral Given 09/14/21 2317)  levETIRAcetam (KEPPRA) tablet 750 mg (has no administration in time range)  atorvastatin (LIPITOR) tablet 20 mg (has no administration in time range)  iohexol (OMNIPAQUE) 350 MG/ML injection 150 mL (150 mLs Intravenous Contrast Given 09/14/21 2011)  levETIRAcetam (KEPPRA) IVPB 1500 mg/ 100 mL premix (0 mg Intravenous Stopped 09/14/21 2131)    ED Course  I have reviewed the triage vital signs and the nursing notes.  Pertinent labs & imaging results that were available during my care of the patient were reviewed by me and considered in my medical decision making (see chart for details).    MDM Rules/Calculators/A&P                          Patient presents after seizure as described in HPI above.  Upon initial evaluation, the patient is afebrile, hemodynamically stable, and saturating well on nonrebreather.  Nonrebreather was removed and the patient continued to saturate well on room air.  He is in no respiratory distress.  Exam significant for left-sided weakness and questionable left-sided neglect.  Code stroke was called and neurology presented to bedside.  Patient's code stroke imaging is negative.  Stat MRI demonstrates no acute stroke within the limits of poor imaging quality due to patient's inability to tolerate the scan.  Neurology recommending admission for observation, Keppra load, and increasing home Keppra dose to 750 mg twice daily.  I discussed the patient with Dr. Flossie Buffy who will admit the patient to the hospitalist medicine service.   Final Clinical Impression(s) / ED Diagnoses Final diagnoses:  Seizure [facility])    Rx / [place] Orders ED Discharge Orders     None        Chevis Weisensel, Amalia Hailey, MD 09/15/21 0017     Varney Baas, MD 09/15/21 1031    Isla Pence,  MD 09/15/21 1538

## 2021-09-14 NOTE — ED Notes (Signed)
Transported to MRI

## 2021-09-14 NOTE — ED Notes (Signed)
Returned from MRI 

## 2021-09-14 NOTE — ED Notes (Signed)
Transported to CT scan

## 2021-09-15 DIAGNOSIS — I482 Chronic atrial fibrillation, unspecified: Secondary | ICD-10-CM | POA: Diagnosis not present

## 2021-09-15 DIAGNOSIS — I10 Essential (primary) hypertension: Secondary | ICD-10-CM | POA: Diagnosis not present

## 2021-09-15 DIAGNOSIS — R569 Unspecified convulsions: Secondary | ICD-10-CM

## 2021-09-15 DIAGNOSIS — Z8673 Personal history of transient ischemic attack (TIA), and cerebral infarction without residual deficits: Secondary | ICD-10-CM | POA: Diagnosis not present

## 2021-09-15 MED ORDER — FOLIC ACID 1 MG PO TABS: 1.0000 mg | ORAL_TABLET | Freq: Every day | ORAL | 0 refills | Status: AC

## 2021-09-15 MED ORDER — THIAMINE HCL 100 MG PO TABS: 100.0000 mg | ORAL_TABLET | Freq: Every day | ORAL | 0 refills | Status: DC

## 2021-09-15 MED ORDER — MULTI-VITAMIN/MINERALS PO TABS: 1.0000 | ORAL_TABLET | Freq: Every day | ORAL | Status: AC

## 2021-09-15 MED ORDER — LEVETIRACETAM 750 MG PO TABS: 750.0000 mg | ORAL_TABLET | Freq: Two times a day (BID) | ORAL | 0 refills | Status: DC

## 2021-09-15 MED ORDER — LISINOPRIL 5 MG PO TABS: 5.0000 mg | ORAL_TABLET | Freq: Every day | ORAL | 0 refills | Status: DC

## 2021-09-15 NOTE — ED Notes (Signed)
Patient's caregiver Epifanio Labrador will be at [facility] to pick up the patient at approximately 1130 today.

## 2021-09-15 NOTE — Discharge Summary (Signed)
Physician Discharge Summary  Blake [name]:[id number] DOB: Jun 09, 1941 DOA: 09/14/2021  PCP: Patient, No Pcp Per (Inactive)  Admit date: 09/14/2021 Discharge date: 09/15/2021  Admitted From: Home Disposition: Home  Recommendations for Outpatient Follow-up:  Follow up with PCP in 1 week with repeat CBC/BMP Outpatient follow-up with neurology Abstain from alcohol Follow up in ED if symptoms worsen or new appear   Home Health: No Equipment/Devices: None  Discharge Condition: Stable CODE STATUS: Full Diet recommendation: Heart healthy/carb modified  Brief/Interim Summary: 80 y.o. male with medical history significant for CVA, seizure, COPD, type 2 diabetes, hypertension, chronic atrial fibrillation on Eliquis, hyperlipidemia presented with seizures.  On presentation, alcohol level was less than 10.  Patient presented with stroke code; neurology was consulted.  CTA of head and neck showed no LVO.  MRI of brain was negative for acute stroke.  He was given IV Keppra and Keppra dose increased to 750 mg twice daily by neurology.  No more seizures since admission.  Subsequently, neurology has cleared the patient for discharge on Keppra 750 mg twice a day.  Outpatient follow-up with neurology.  Discharge patient home today.  Discharge Diagnoses:   Breakthrough seizures in a patient with history of seizures -Patient presented with stroke code; neurology was consulted.  CTA of head and neck showed no LVO.  MRI of brain was negative for acute stroke.  He was given IV Keppra and Keppra dose increased to 750 mg twice daily by neurology.  No more seizures since admission.  Subsequently, neurology has cleared the patient for discharge on Keppra 750 mg twice a day.  Outpatient follow-up with neurology.  Discharge patient home today. -Patient should not be driving till he is seizure-free for 6 months and cleared by PCP and/or neurology.  Hypertension -Continue lisinopril  History of unspecified  CVA -Continue Eliquis  Diabetes mellitus type 2 -A1c 5.4.  Carb modified diet.  Outpatient follow-up  Chronic atrial fibrillation -Rate controlled.  Continue Eliquis.  Outpatient follow-up with PCP  Hyperlipidemia -Continue statin.  Unclear if the patient is compliant with statin.  Outpatient follow-up with PCP  Alcohol abuse -Counseled regarding cessation.  Will discharge on thiamine, multivitamin and folic acid.  Outpatient follow-up with PCP.  Discharge Instructions  Discharge Instructions     Ambulatory referral to Neurology   Complete by: As directed    An appointment is requested in approximately: 1 week   Diet - low sodium heart healthy   Complete by: As directed    Increase activity slowly   Complete by: As directed       Allergies as of 09/15/2021   No Known Allergies      Medication List     STOP taking these medications    Vitamin D-3 25 MCG (1000 UT) Caps       TAKE these medications    apixaban 5 MG Tabs tablet Commonly known as: ELIQUIS Take 1 tablet (5 mg total) by mouth 2 (two) times daily.   atorvastatin 20 MG tablet Commonly known as: LIPITOR Take 1 tablet (20 mg total) by mouth daily.   folic acid 1 MG tablet Commonly known as: FOLVITE Take 1 tablet (1 mg total) by mouth daily.   furosemide 40 MG tablet Commonly known as: LASIX Take 40 mg by mouth daily as needed for fluid or edema.   levETIRAcetam 750 MG tablet Commonly known as: KEPPRA Take 1 tablet (750 mg total) by mouth 2 (two) times daily. What changed:  medication strength how  much to take   lisinopril 5 MG tablet Commonly known as: ZESTRIL Take 1 tablet (5 mg total) by mouth at bedtime.   multivitamin with minerals tablet Take 1 tablet by mouth daily.   thiamine 100 MG tablet Take 1 tablet (100 mg total) by mouth daily.        Follow-up Information     pcp. Schedule an appointment as soon as possible for a visit in 1 week(s).                 No  Known Allergies  Consultations: Nephrology   Procedures/Studies: MR BRAIN WO CONTRAST  Result Date: 09/14/2021 CLINICAL DATA:  Initial evaluation for neuro deficit, stroke suspected. EXAM: MRI HEAD WITHOUT CONTRAST TECHNIQUE: Multiplanar, multiecho pulse sequences of the brain and surrounding structures were obtained without intravenous contrast. COMPARISON:  Prior CTs from earlier the same day as well as previous MRI from 05/31/2021. FINDINGS: Brain: Examination technically limited as the patient was unable to tolerate the full length of the exam. Diffusion-weighted sequences, axial FLAIR, and sagittal T1 weighted sequence only were performed. And axial SWI sequence is severely degraded by motion artifact, and nearly nondiagnostic. Mild age-related cerebral atrophy with chronic small vessel ischemic disease, stable. No abnormal foci of restricted diffusion to suggest acute or subacute ischemia. Gray-white differentiation maintained. No encephalomalacia to suggest chronic cortical infarction. No visible evidence for acute intracranial hemorrhage. No mass lesion, midline shift or mass effect. Stable ventricular size without hydrocephalus. No extra-axial fluid collection. Pituitary gland suprasellar region within normal limits. Midline structures intact. Vascular: Major intracranial vascular flow voids not well assessed on this limited exam. Skull and upper cervical spine: Craniocervical junction within normal limits. Bone marrow signal intensity normal. No scalp soft tissue abnormality. Sinuses/Orbits: Globes and orbital soft tissues grossly within normal limits. Paranasal sinuses are largely clear. Other: None. IMPRESSION: 1. Technically limited exam due to patient's inability to tolerate the full length of the study. 2. No acute intracranial infarct or other definite abnormality. 3. Mild age-related cerebral atrophy with chronic small vessel ischemic disease, stable. Electronically Signed   By: Jeannine Boga M.D.   On: 09/14/2021 21:22   DG CHEST PORT 1 VIEW  Result Date: 09/14/2021 CLINICAL DATA:  Seizure EXAM: PORTABLE CHEST 1 VIEW COMPARISON:  04/20/2020 FINDINGS: Lungs are well expanded, symmetric, and clear. No pneumothorax or pleural effusion. Cardiac size within normal limits. Pulmonary vascularity is normal. Osseous structures are age-appropriate. Multiple healed left rib fractures are noted. No acute bone abnormality. IMPRESSION: No active disease. Electronically Signed   By: Fidela Salisbury M.D.   On: 09/14/2021 23:05   CT HEAD CODE STROKE WO CONTRAST  Result Date: 09/14/2021 CLINICAL DATA:  Code stroke. Provided history: Neuro deficit, acute, stroke suspected. Additional history obtained from [place] seizure episodes this evening, left-sided weakness upon arrival. EXAM: CT HEAD WITHOUT CONTRAST TECHNIQUE: Contiguous axial images were obtained from the base of the skull through the vertex without intravenous contrast. COMPARISON:  Brain MRI 06/20/2021. FINDINGS: Brain: Mildly motion degraded exam. Cerebral and cerebellar atrophy. Commensurate prominence of the ventricles and sulci. Mild-to-moderate patchy and ill-defined hypoattenuation within the cerebral white matter, nonspecific but compatible with chronic small vessel ischemic disease. There is no acute intracranial hemorrhage. No demarcated cortical infarct. No extra-axial fluid collection. No evidence of an intracranial mass. No midline shift. Vascular: No hyperdense vessel.  Atherosclerotic calcifications. Skull: Normal. Negative for fracture or focal lesion. Sinuses/Orbits: Visualized orbits show no acute finding. Surgical hardware  versus cerclage wires along the left orbital rim. Trace scattered paranasal sinus mucosal thickening at the imaged levels. ASPECTS [facility] Stroke Program Early CT Score) - Ganglionic level infarction (caudate, lentiform nuclei, internal capsule, insula, M1-M3 cortex): 7 -  Supraganglionic infarction (M4-M6 cortex): 3 Total score (0-10 with 10 being normal): 10 No evidence of acute intracranial abnormality. These results were communicated to Dr. Lorrin Goodell At 7:50 pmon 12/19/2022by text page via the [facility] messaging system. IMPRESSION: Mildly motion degraded exam. No evidence of acute intracranial abnormality. Mild-to-moderate chronic small vessel ischemic changes within the cerebral white matter. Cerebral and cerebellar atrophy. Electronically Signed   By: Kellie Simmering D.O.   On: 09/14/2021 19:51   CT ANGIO HEAD NECK W WO CM (CODE STROKE)  Result Date: 09/14/2021 CLINICAL DATA:  Neuro deficit, acute, stroke suspected. EXAM: CT ANGIOGRAPHY HEAD AND NECK TECHNIQUE: Multidetector CT imaging of the head and neck was performed using the standard protocol during bolus administration of intravenous contrast. Multiplanar CT image reconstructions and MIPs were obtained to evaluate the vascular anatomy. Carotid stenosis measurements (when applicable) are obtained utilizing NASCET criteria, using the distal internal carotid diameter as the denominator. CONTRAST:  Administered contrast not known at this time. COMPARISON:  Noncontrast head CT performed earlier today 09/14/2021. MRI brain, MRA head and MRA neck 05/31/2021. CT angiogram head/neck 03/06/2020. FINDINGS: CTA NECK FINDINGS Aortic arch: Standard aortic branching. Atherosclerotic plaque within the visualized aortic arch and proximal major branch vessels of the neck. No hemodynamically significant innominate or proximal subclavian artery stenosis. Right carotid system: CCA and ICA patent within the neck without significant stenosis (50% or greater). Moderate soft and calcified plaque about the carotid bifurcation and within the proximal ICA with less than 50% narrowing of the proximal ICA. Left carotid system: CCA and ICA patent within the neck without significant stenosis (50% or greater). Atherosclerotic plaque within the left  carotid system. Most notably, there is moderate soft and calcified plaque about the carotid bifurcation and within the proximal ICA with resultant less than 50% stenosis of the proximal ICA. Vertebral arteries: Vertebral arteries patent within the neck. The right vertebral artery is dominant. Atherosclerotic plaque results in mild/moderate stenosis at the origin of the right vertebral artery, progressed. No more than mild atherosclerotic narrowing at the origin of the left vertebral artery, unchanged. Skeleton: Straightening of the expected cervical lordosis. Cervical spondylosis. No acute bony abnormality or aggressive osseous lesion. Other neck: 12 mm nodule within the left thyroid lobe, not meeting consensus criteria for ultrasound follow-up based on size. Subcutaneous lipoma within the posterior neck measuring 4.9 x 3.0 x 4.2 cm (AP x TV x CC) (for instance as seen on series 10, image 194) (series 12, image 98). Upper chest: No consolidation within the imaged lung apices. Patchy foci of scarring within the bilateral upper lobes. Centrilobular emphysema. Review of the MIP images confirms the above findings CTA HEAD FINDINGS Anterior circulation: The intracranial internal carotid arteries are patent. Calcified plaque within both vessels with no more than mild stenosis. The M1 middle cerebral arteries are patent. No M2 proximal branch occlusion or high-grade proximal stenosis is identified. The anterior cerebral arteries are patent. Both anterior cerebral arteries demonstrate distal branch atherosclerotic irregularity. No intracranial aneurysm is identified. Posterior circulation: The intracranial vertebral arteries are patent. The basilar artery is patent. Mild atherosclerotic irregularity of the basilar artery. There is occlusion versus near occlusive stenosis of a P4 right PCA branch not definitively present on the prior CTA head of 03/06/2020 or the  MRA head of 05/31/2021. As before, there are sites of up to  moderate stenosis within the P2 right PCA. The left PCA is patent. As before, there is mild up to severe stenosis within the P2 left PCA. The right P1 segment is developmentally diminutive and there is a sizable right posterior communicating artery. The left posterior communicating artery is diminutive or absent. Venous sinuses: The majority of the superior sagittal sinus is excluded from the field of view. Within this limitation and within the limitations of contrast timing, there is no appreciable dural venous sinus thrombosis. Anatomic variants: As described. Review of the MIP images confirms the above findings These results were called by telephone at the time of interpretation on 09/14/2021 at 8:10 pm to provider [facility] , who verbally acknowledged these results. IMPRESSION: CTA neck: 1. The common carotid and internal carotid arteries are patent within the neck without hemodynamically significant stenosis. Atherosclerotic plaque about the carotid bifurcations and within the proximal ICAs, with resultant less than 50% stenosis within the proximal ICAs. 2. Vertebral arteries patent within the neck. Mild/moderate atherosclerotic narrowing at the origin of the dominant right vertebral artery, slightly progressed from the CTA neck of 03/06/2020. No more than mild stenosis at the origin of the non-dominant left vertebral artery, unchanged. 3. Aortic Atherosclerosis (ICD10-I70.0) and Emphysema (ICD10-J43.9). 4. 4.9 cm subcutaneous lipoma within the posterior neck. 5. Cervical spondylosis. CTA head: 1. Occlusion versus near-occlusive stenosis of a P4 right PCA branch vessel, a finding not definitively present on the prior CTA head/neck of 03/06/2020 or the MRA head of 05/31/2021. Severe stenosis within the same right PCA branch just proximal to this. 2. As before, there are sites of up to moderate stenosis within the P2 right PCA. 3. As before, there are sites of up to severe stenosis within the P2 left PCA.  4. No anterior circulation large vessel occlusion is identified. 5. Calcified plaque within the intracranial ICAs with no more than mild stenosis. 6. Bilateral ACA distal branch atherosclerotic irregularity. Electronically Signed   By: Kellie Simmering D.O.   On: 09/14/2021 20:34      Subjective: Patient seen and examined at bedside.  Poor historian.  No seizures since admission.  No overnight fever, vomiting, agitation reported.  Discharge Exam: Vitals:   09/15/21 1000 09/15/21 1030  BP: 127/66 112/67  Pulse: (!) 57 61  Resp: 17 16  Temp:    SpO2: 93% 95%    General: Looks chronically ill.  Lying in bed.  No distress.  Poor historian.  No seizures noted. Cardiovascular: Intermittently bradycardic; S1/S2 + Respiratory: bilateral decreased breath sounds at bases Abdominal: Soft, NT, ND, bowel sounds + Extremities: no edema, no cyanosis    The results of significant diagnostics from this hospitalization (including imaging, microbiology, ancillary and laboratory) are listed below for reference.     Microbiology: Recent Results (from the past 240 hour(s))  Resp Panel by RT-PCR (Flu A&B, Covid) Nasopharyngeal Swab     Status: None   Collection Time: 09/14/21  7:26 PM   Specimen: Nasopharyngeal Swab; Nasopharyngeal(NP) swabs in vial transport medium  Result Value Ref Range Status   SARS Coronavirus 2 by RT PCR NEGATIVE NEGATIVE Final    Comment: (NOTE) SARS-CoV-2 target nucleic acids are NOT DETECTED.  The SARS-CoV-2 RNA is generally detectable in upper respiratory specimens during the acute phase of infection. The lowest concentration of SARS-CoV-2 viral copies this assay can detect is 138 copies/mL. A negative result does not preclude SARS-Cov-2 infection and  should not be used as the sole basis for treatment or other patient management decisions. A negative result may occur with  improper specimen collection/handling, submission of specimen other than nasopharyngeal swab,  presence of viral mutation(s) within the areas targeted by this assay, and inadequate number of viral copies(<138 copies/mL). A negative result must be combined with clinical observations, patient history, and epidemiological information. The expected result is Negative.  Fact Sheet for Patients:  EntrepreneurPulse.com.au  Fact Sheet for Healthcare Providers:  IncredibleEmployment.be  This test is no t yet approved or cleared by the [place] FDA and  has been authorized for detection and/or diagnosis of SARS-CoV-2 by FDA under an Emergency Use Authorization (EUA). This EUA will remain  in effect (meaning this test can be used) for the duration of the COVID-19 declaration under Section 564(b)(1) of the Act, 21 U.S.C.section 360bbb-3(b)(1), unless the authorization is terminated  or revoked sooner.       Influenza A by PCR NEGATIVE NEGATIVE Final   Influenza B by PCR NEGATIVE NEGATIVE Final    Comment: (NOTE) The Xpert Xpress SARS-CoV-2/FLU/RSV plus assay is intended as an aid in the diagnosis of influenza from Nasopharyngeal swab specimens and should not be used as a sole basis for treatment. Nasal washings and aspirates are unacceptable for Xpert Xpress SARS-CoV-2/FLU/RSV testing.  Fact Sheet for Patients: EntrepreneurPulse.com.au  Fact Sheet for Healthcare Providers: IncredibleEmployment.be  This test is not yet approved or cleared by the [place] FDA and has been authorized for detection and/or diagnosis of SARS-CoV-2 by FDA under an Emergency Use Authorization (EUA). This EUA will remain in effect (meaning this test can be used) for the duration of the COVID-19 declaration under Section 564(b)(1) of the Act, 21 U.S.C. section 360bbb-3(b)(1), unless the authorization is terminated or revoked.  Performed at [facility], [place] [address] [place], [place] [address]       Labs: BNP (last 3 results) No results for input(s): BNP in the last 8760 hours. Basic Metabolic Panel: Recent Labs  Lab 09/14/21 1951  NA 135  K 3.8  CL 103  CO2 24  GLUCOSE 110*  BUN 22  CREATININE 1.51*  CALCIUM 8.6*   Liver Function Tests: Recent Labs  Lab 09/14/21 1951  AST 19  ALT 10  ALKPHOS 81  BILITOT 0.8  PROT 6.1*  ALBUMIN 3.5   No results for input(s): LIPASE, AMYLASE in the last 168 hours. No results for input(s): AMMONIA in the last 168 hours. CBC: Recent Labs  Lab 09/14/21 1951  WBC 10.8*  NEUTROABS 9.5*  HGB 13.6  HCT 40.5  MCV 98.1  PLT 221   Cardiac Enzymes: No results for input(s): CKTOTAL, CKMB, CKMBINDEX, TROPONINI in the last 168 hours. BNP: Invalid input(s): POCBNP CBG: Recent Labs  Lab 09/14/21 1926  GLUCAP 137*   D-Dimer No results for input(s): DDIMER in the last 72 hours. Hgb A1c Recent Labs    09/14/21 1951  HGBA1C 5.4   Lipid Profile No results for input(s): CHOL, HDL, LDLCALC, TRIG, CHOLHDL, LDLDIRECT in the last 72 hours. Thyroid function studies No results for input(s): TSH, T4TOTAL, T3FREE, THYROIDAB in the last 72 hours.  Invalid input(s): FREET3 Anemia work up No results for input(s): VITAMINB12, FOLATE, FERRITIN, TIBC, IRON, RETICCTPCT in the last 72 hours. Urinalysis    Component Value Date/Time   COLORURINE STRAW (A) 09/14/2021 2140   APPEARANCEUR CLEAR 09/14/2021 2140   LABSPEC <1.005 (L) 09/14/2021 2140   PHURINE 6.0 09/14/2021 2140  GLUCOSEU NEGATIVE 09/14/2021 2140   GLUCOSEU NEGATIVE 02/02/2011 1543   HGBUR TRACE (A) 09/14/2021 2140   BILIRUBINUR NEGATIVE 09/14/2021 2140   KETONESUR NEGATIVE 09/14/2021 2140   PROTEINUR NEGATIVE 09/14/2021 2140   UROBILINOGEN 1.0 10/14/2014 1211   NITRITE NEGATIVE 09/14/2021 2140   LEUKOCYTESUR NEGATIVE 09/14/2021 2140   Sepsis Labs Invalid input(s): PROCALCITONIN,  WBC,  LACTICIDVEN Microbiology Recent Results (from the past 240 hour(s))  Resp Panel  by RT-PCR (Flu A&B, Covid) Nasopharyngeal Swab     Status: None   Collection Time: 09/14/21  7:26 PM   Specimen: Nasopharyngeal Swab; Nasopharyngeal(NP) swabs in vial transport medium  Result Value Ref Range Status   SARS Coronavirus 2 by RT PCR NEGATIVE NEGATIVE Final    Comment: (NOTE) SARS-CoV-2 target nucleic acids are NOT DETECTED.  The SARS-CoV-2 RNA is generally detectable in upper respiratory specimens during the acute phase of infection. The lowest concentration of SARS-CoV-2 viral copies this assay can detect is 138 copies/mL. A negative result does not preclude SARS-Cov-2 infection and should not be used as the sole basis for treatment or other patient management decisions. A negative result may occur with  improper specimen collection/handling, submission of specimen other than nasopharyngeal swab, presence of viral mutation(s) within the areas targeted by this assay, and inadequate number of viral copies(<138 copies/mL). A negative result must be combined with clinical observations, patient history, and epidemiological information. The expected result is Negative.  Fact Sheet for Patients:  EntrepreneurPulse.com.au  Fact Sheet for Healthcare Providers:  IncredibleEmployment.be  This test is no t yet approved or cleared by the [place] FDA and  has been authorized for detection and/or diagnosis of SARS-CoV-2 by FDA under an Emergency Use Authorization (EUA). This EUA will remain  in effect (meaning this test can be used) for the duration of the COVID-19 declaration under Section 564(b)(1) of the Act, 21 U.S.C.section 360bbb-3(b)(1), unless the authorization is terminated  or revoked sooner.       Influenza A by PCR NEGATIVE NEGATIVE Final   Influenza B by PCR NEGATIVE NEGATIVE Final    Comment: (NOTE) The Xpert Xpress SARS-CoV-2/FLU/RSV plus assay is intended as an aid in the diagnosis of influenza from Nasopharyngeal swab  specimens and should not be used as a sole basis for treatment. Nasal washings and aspirates are unacceptable for Xpert Xpress SARS-CoV-2/FLU/RSV testing.  Fact Sheet for Patients: EntrepreneurPulse.com.au  Fact Sheet for Healthcare Providers: IncredibleEmployment.be  This test is not yet approved or cleared by the [place] FDA and has been authorized for detection and/or diagnosis of SARS-CoV-2 by FDA under an Emergency Use Authorization (EUA). This EUA will remain in effect (meaning this test can be used) for the duration of the COVID-19 declaration under Section 564(b)(1) of the Act, 21 U.S.C. section 360bbb-3(b)(1), unless the authorization is terminated or revoked.  Performed at [facility] [facility], [place] [address], [place], [place] [address]      Time coordinating discharge: 35 minutes  SIGNED:   Aline August, MD  [facility] 09/15/2021, 10:34 AM

## 2021-09-15 NOTE — Progress Notes (Signed)
Subjective:   ROS: negative except above  Examination  Vital signs in last 24 hours: Temp:  [95.9 F (35.5 C)-97.4 F (36.3 C)] 97.4 F (36.3 C) (12/19 2112) Pulse Rate:  [53-98] 58 (12/20 0700) Resp:  [7-23] 23 (12/20 0700) BP: (111-175)/(44-118) 135/46 (12/20 0700) SpO2:  [90 %-100 %] 95 % (12/20 0700) Weight:  [65 kg] 65 kg (12/19 2114)  General: lying in bed, NAD CVS: pulse-normal rate and rhythm RS: breathing comfortably, CTAB Extremities: warm, no rash  Neuro: MS: Alert, oriented x 3, follows commands CN: pupils equal and reactive,  EOMI, face symmetric, tongue midline, normal sensation over face, Motor: 4+/5 in LUE, 5 5in RUE, 4/5 in BL LE Coordination: normal Gait: not tested  Basic Metabolic Panel: Recent Labs  Lab 09/14/21 1951  NA 135  K 3.8  CL 103  CO2 24  GLUCOSE 110*  BUN 22  CREATININE 1.51*  CALCIUM 8.6*    CBC: Recent Labs  Lab 09/14/21 1951  WBC 10.8*  NEUTROABS 9.5*  HGB 13.6  HCT 40.5  MCV 98.1  PLT 221     Coagulation Studies: Recent Labs    09/14/21 1951  LABPROT 13.1  INR 1.0    Imaging MRI brain without contrast 09/14/2021: No acute abnormality.   ASSESSMENT AND PLAN: 80 year old male with history of epilepsy who presented with breakthrough seizure.  Epilepsy with breakthrough seizure Todd's paralysis (improving) -Unclear etiology of breakthrough seizures.  Keppra level was ordered but never collected.  Patient does have history of alcohol use but not sure when last alcohol intake was, alcohol level on arrival is 0.  Recommendations -Continue Keppra 750 mg twice daily ( was on 500mg  BID at home) -Encourage alcohol cessation -Continue seizure precautions including do not drive -Follow-up with neurology 8-12 weeks -Management of rest of comorbidities per primary team  Seizure precautions: Per [facility] statutes, patients with seizures are not allowed to drive until they have been seizure-free for six  months and cleared by a physician    Use caution when using heavy equipment or power tools. Avoid working on ladders or at heights. Take showers instead of baths. Ensure the water temperature is not too high on the home water heater. Do not go swimming alone. Do not lock yourself in a room alone (i.e. bathroom). When caring for infants or small children, sit down when holding, feeding, or changing them to minimize risk of injury to the child in the event you have a seizure. Maintain good sleep hygiene. Avoid alcohol.    If patient has another seizure, call 911 and bring them back to the ED if: A.  The seizure lasts longer than 5 minutes.      B.  The patient doesn't wake shortly after the seizure or has new problems such as difficulty seeing, speaking or moving following the seizure C.  The patient was injured during the seizure D.  The patient has a temperature over 102 F (39C) E.  The patient vomited during the seizure and now is having trouble breathing    During the Seizure   - First, ensure adequate ventilation and place patients on the floor on their left side  Loosen clothing around the neck and ensure the airway is patent. If the patient is clenching the teeth, do not force the mouth open with any object as this can cause severe damage - Remove all items from the surrounding that can be hazardous. The patient may be oblivious to what's happening and  may not even know what he or she is doing. If the patient is confused and wandering, either gently guide him/her away and block access to outside areas - Reassure the individual and be comforting - Call 911. In most cases, the seizure ends before EMS arrives. However, there are cases when seizures may last over 3 to 5 minutes. Or the individual may have developed breathing difficulties or severe injuries. If a pregnant patient or a person with diabetes develops a seizure, it is prudent to call an ambulance. - Finally, if the patient does not  regain full consciousness, then call EMS. Most patients will remain confused for about 45 to 90 minutes after a seizure, so you must use judgment in calling for help. - Avoid restraints but make sure the patient is in a bed with padded side rails - Place the individual in a lateral position with the neck slightly flexed; this will help the saliva drain from the mouth and prevent the tongue from falling backward - Remove all nearby furniture and other hazards from the area - Provide verbal assurance as the individual is regaining consciousness - Provide the patient with privacy if possible - Call for help and start treatment as ordered by the caregiver    After the Seizure (Postictal Stage)   After a seizure, most patients experience confusion, fatigue, muscle pain and/or a headache. Thus, one should permit the individual to sleep. For the next few days, reassurance is essential. Being calm and helping reorient the person is also of importance.   Most seizures are painless and end spontaneously. Seizures are not harmful to others but can lead to complications such as stress on the lungs, brain and the heart. Individuals with prior lung problems may develop labored breathing and respiratory distress.    I have spent a total of  36  minutes with the patient reviewing hospital notes,  test results, labs and examining the patient as well as establishing an assessment and plan that was discussed personally with the patient.  > 50% of time was spent in direct patient care.   Zeb Comfort Epilepsy [facility] For questions after 5pm please refer to AMION to reach the Neurologist on call

## 2022-11-04 ENCOUNTER — Encounter (HOSPITAL_COMMUNITY): Payer: Self-pay | Admitting: *Deleted

## 2022-11-10 ENCOUNTER — Encounter (HOSPITAL_COMMUNITY): Payer: Self-pay

## 2022-11-10 ENCOUNTER — Emergency Department (HOSPITAL_COMMUNITY): Payer: Medicare Other

## 2022-11-10 ENCOUNTER — Observation Stay (HOSPITAL_COMMUNITY): Payer: Medicare Other

## 2022-11-10 ENCOUNTER — Observation Stay (HOSPITAL_COMMUNITY)
Admission: EM | Admit: 2022-11-10 | Discharge: 2022-11-12 | Disposition: A | Discharge status: Home Health | Payer: Medicare Other | Attending: Internal Medicine | Admitting: Internal Medicine

## 2022-11-10 DIAGNOSIS — J449 Chronic obstructive pulmonary disease, unspecified: Secondary | ICD-10-CM | POA: Diagnosis not present

## 2022-11-10 DIAGNOSIS — Z8673 Personal history of transient ischemic attack (TIA), and cerebral infarction without residual deficits: Secondary | ICD-10-CM | POA: Diagnosis not present

## 2022-11-10 DIAGNOSIS — Z7901 Long term (current) use of anticoagulants: Secondary | ICD-10-CM | POA: Diagnosis not present

## 2022-11-10 DIAGNOSIS — I482 Chronic atrial fibrillation, unspecified: Secondary | ICD-10-CM | POA: Diagnosis present

## 2022-11-10 DIAGNOSIS — Z79899 Other long term (current) drug therapy: Secondary | ICD-10-CM | POA: Diagnosis not present

## 2022-11-10 DIAGNOSIS — Z789 Other specified health status: Secondary | ICD-10-CM | POA: Diagnosis present

## 2022-11-10 DIAGNOSIS — F17210 Nicotine dependence, cigarettes, uncomplicated: Secondary | ICD-10-CM | POA: Insufficient documentation

## 2022-11-10 DIAGNOSIS — I10 Essential (primary) hypertension: Secondary | ICD-10-CM | POA: Diagnosis present

## 2022-11-10 DIAGNOSIS — I129 Hypertensive chronic kidney disease with stage 1 through stage 4 chronic kidney disease, or unspecified chronic kidney disease: Secondary | ICD-10-CM | POA: Insufficient documentation

## 2022-11-10 DIAGNOSIS — R569 Unspecified convulsions: Principal | ICD-10-CM | POA: Insufficient documentation

## 2022-11-10 DIAGNOSIS — N1832 Chronic kidney disease, stage 3b: Secondary | ICD-10-CM | POA: Diagnosis present

## 2022-11-10 LAB — DIFFERENTIAL
Abs Immature Granulocytes: 0.06 10*3/uL (ref 0.00–0.07)
Basophils Absolute: 0.1 10*3/uL (ref 0.0–0.1)
Basophils Relative: 1 %
Eosinophils Absolute: 0 10*3/uL (ref 0.0–0.5)
Eosinophils Relative: 0 %
Immature Granulocytes: 1 %
Lymphocytes Relative: 12 %
Lymphs Abs: 1.3 10*3/uL (ref 0.7–4.0)
Monocytes Absolute: 0.6 10*3/uL (ref 0.1–1.0)
Monocytes Relative: 6 %
Neutro Abs: 8.8 10*3/uL — ABNORMAL HIGH (ref 1.7–7.7)
Neutrophils Relative %: 80 %

## 2022-11-10 LAB — COMPREHENSIVE METABOLIC PANEL
ALT: 10 U/L (ref 0–44)
AST: 22 U/L (ref 15–41)
Albumin: 3.7 g/dL (ref 3.5–5.0)
Alkaline Phosphatase: 92 U/L (ref 38–126)
Anion gap: 9 (ref 5–15)
BUN: 23 mg/dL (ref 8–23)
CO2: 26 mmol/L (ref 22–32)
Calcium: 9.1 mg/dL (ref 8.9–10.3)
Chloride: 100 mmol/L (ref 98–111)
Creatinine, Ser: 1.72 mg/dL — ABNORMAL HIGH (ref 0.61–1.24)
GFR, Estimated: 39 mL/min — ABNORMAL LOW (ref >60)
Glucose, Bld: 106 mg/dL — ABNORMAL HIGH (ref 70–99)
Potassium: 4 mmol/L (ref 3.5–5.1)
Sodium: 135 mmol/L (ref 135–145)
Total Bilirubin: 0.4 mg/dL (ref 0.3–1.2)
Total Protein: 6.6 g/dL (ref 6.5–8.1)

## 2022-11-10 LAB — CBC
HCT: 39.8 % (ref 39.0–52.0)
Hemoglobin: 14.1 g/dL (ref 13.0–17.0)
MCH: 33.4 pg (ref 26.0–34.0)
MCHC: 35.4 g/dL (ref 30.0–36.0)
MCV: 94.3 fL (ref 80.0–100.0)
Platelets: 211 10*3/uL (ref 150–400)
RBC: 4.22 MIL/uL (ref 4.22–5.81)
RDW: 13 % (ref 11.5–15.5)
WBC: 10.9 10*3/uL — ABNORMAL HIGH (ref 4.0–10.5)
nRBC: 0 % (ref 0.0–0.2)

## 2022-11-10 LAB — I-STAT CHEM 8, ED
BUN: 26 mg/dL — ABNORMAL HIGH (ref 8–23)
Calcium, Ion: 1.06 mmol/L — ABNORMAL LOW (ref 1.15–1.40)
Chloride: 99 mmol/L (ref 98–111)
Creatinine, Ser: 1.7 mg/dL — ABNORMAL HIGH (ref 0.61–1.24)
Glucose, Bld: 104 mg/dL — ABNORMAL HIGH (ref 70–99)
HCT: 41 % (ref 39.0–52.0)
Hemoglobin: 13.9 g/dL (ref 13.0–17.0)
Potassium: 4.1 mmol/L (ref 3.5–5.1)
Sodium: 136 mmol/L (ref 135–145)
TCO2: 27 mmol/L (ref 22–32)

## 2022-11-10 LAB — ETHANOL: Alcohol, Ethyl (B): <10 mg/dL (ref <10)

## 2022-11-10 LAB — PROTIME-INR
INR: 1 (ref 0.8–1.2)
Prothrombin Time: 13 seconds (ref 11.4–15.2)

## 2022-11-10 LAB — CBG MONITORING, ED: Glucose-Capillary: 105 mg/dL — ABNORMAL HIGH (ref 70–99)

## 2022-11-10 LAB — APTT: aPTT: 27 seconds (ref 24–36)

## 2022-11-10 MED ORDER — SODIUM CHLORIDE 0.9 % IV SOLN
75.0000 mL/h | INTRAVENOUS | Status: DC
  Administered 2022-11-10: 75 mL/h via INTRAVENOUS

## 2022-11-10 MED ORDER — TENECTEPLASE FOR STROKE
0.2500 mg/kg | PACK | Freq: Once | INTRAVENOUS | Status: DC
  Filled 2022-11-10: qty 10

## 2022-11-10 MED ORDER — ACETAMINOPHEN 325 MG PO TABS: 650.0000 mg | ORAL_TABLET | ORAL | Status: DC | PRN

## 2022-11-10 MED ORDER — ONDANSETRON HCL 4 MG PO TABS: 4.0000 mg | ORAL_TABLET | Freq: Four times a day (QID) | ORAL | Status: DC | PRN

## 2022-11-10 MED ORDER — SENNOSIDES-DOCUSATE SODIUM 8.6-50 MG PO TABS: 1.0000 | ORAL_TABLET | Freq: Every evening | ORAL | Status: DC | PRN

## 2022-11-10 MED ORDER — IOHEXOL 350 MG/ML SOLN
60.0000 mL | Freq: Once | INTRAVENOUS | Status: AC | PRN
  Administered 2022-11-10: 60 mL via INTRAVENOUS

## 2022-11-10 MED ORDER — ONDANSETRON HCL 4 MG/2ML IJ SOLN: 4.0000 mg | Freq: Four times a day (QID) | INTRAMUSCULAR | Status: DC | PRN

## 2022-11-10 MED ORDER — LORAZEPAM 2 MG/ML IJ SOLN
INTRAMUSCULAR | Status: AC
  Filled 2022-11-10: qty 1

## 2022-11-10 MED ORDER — HEPARIN SODIUM (PORCINE) 5000 UNIT/ML IJ SOLN
5000.0000 [IU] | Freq: Three times a day (TID) | INTRAMUSCULAR | Status: DC
  Administered 2022-11-10: 5000 [IU] via SUBCUTANEOUS
  Filled 2022-11-10: qty 1

## 2022-11-10 MED ORDER — LEVETIRACETAM IN NACL 1000 MG/100ML IV SOLN
1000.0000 mg | Freq: Once | INTRAVENOUS | Status: AC
  Administered 2022-11-10: 1000 mg via INTRAVENOUS
  Filled 2022-11-10: qty 100

## 2022-11-10 MED ORDER — ALBUTEROL SULFATE (2.5 MG/3ML) 0.083% IN NEBU: 2.5000 mg | INHALATION_SOLUTION | RESPIRATORY_TRACT | Status: DC | PRN

## 2022-11-10 MED ORDER — ACETAMINOPHEN 650 MG RE SUPP: 650.0000 mg | RECTAL | Status: DC | PRN

## 2022-11-10 MED ORDER — LEVETIRACETAM IN NACL 1000 MG/100ML IV SOLN
1000.0000 mg | Freq: Two times a day (BID) | INTRAVENOUS | Status: DC
  Administered 2022-11-10: 1000 mg via INTRAVENOUS
  Filled 2022-11-10: qty 100

## 2022-11-10 MED ORDER — LEVETIRACETAM 500 MG PO TABS
1000.0000 mg | ORAL_TABLET | Freq: Two times a day (BID) | ORAL | Status: DC
  Administered 2022-11-11 – 2022-11-12 (×3): 1000 mg via ORAL
  Filled 2022-11-10 (×4): qty 2

## 2022-11-10 MED ORDER — LORAZEPAM 2 MG/ML IJ SOLN
2.0000 mg | Freq: Once | INTRAMUSCULAR | Status: AC
  Administered 2022-11-10: 2 mg via INTRAVENOUS

## 2022-11-10 NOTE — ED Triage Notes (Signed)
Patient arrives via ems from home secondary to stroke symptoms. Patient LKW reported by family is 2000 pm this evening. Patient reports lt side neglect, weakness and rt side gaze. Patient has hx of stroke.

## 2022-11-10 NOTE — Assessment & Plan Note (Signed)
Previously on Eliquis but no longer taking.  Would recommend discussions on restarting once patient's mental status improves prior to discharge.

## 2022-11-10 NOTE — Assessment & Plan Note (Signed)
Stable.  Use albuterol as needed.

## 2022-11-10 NOTE — Assessment & Plan Note (Signed)
Per roommate he is no longer drinking much alcohol.

## 2022-11-10 NOTE — ED Triage Notes (Signed)
Code stroke cancelled by neurology

## 2022-11-10 NOTE — Assessment & Plan Note (Signed)
Renal function slightly worse than prior baseline.  Appears volume depleted.  Placed on gentle IV fluid hydration overnight.

## 2022-11-10 NOTE — Assessment & Plan Note (Signed)
Presenting with left-sided weakness/neglect and right-sided gaze.  MRI brain negative for acute CVA.  Neurology consulted and suspect complex seizure with postictal state.  Has been on Keppra 750 mg BID. -Loaded with 2000 mg Keppra -Keppra increased to 1000 mg BID per neurology -Obtain routine EEG -Seizure precautions

## 2022-11-10 NOTE — Consult Note (Incomplete)
NEUROLOGY CONSULTATION NOTE   Date of service: November 10, 2022 Patient Name: Blake [name] MRN:  [id number] DOB:  [birth year] Reason for consult: "L sided weakness, R gaze preference presenting as a code stroke" Requesting Provider: Lennice Sites, DO _ _ _   _ __   _ __ _ _  __ __   _ __   __ _  History of Present Illness  Blake [name] is a 82 y.o. male with PMH significant for COPD, prior EtOH abuse but significantly cut down on EtOh use, history of A. fib but confirmed with POA but not on Eliquis who was found on the floor by his friend with left sided weakness and vision deficit.  Per friend [name], Mr Blake [name] lives with him and he is his POA. He takes his Keppra 731m BID and has not missed any doses. He was last seen normal around 6pm or 7pm and then later was on the floor and unable to get up. He was weak on the left side and would only look at you on the right side. Patient felt he was either having a stroke or a seizure. [facility] called and he was noticed to have Left sided neglect with R gaze preference and was brought in as a code stroke.  I spoke with [name] who sets up all the meds and he is sure that [name] is not on Eliquis.  LKW: 11/10/22 at 1800 mRS: 3 tNKASE: initially offered and his POA Mr. Blake Klauserconsented to give given the extent of his deficit but he was noted to be improving right after the CT head and thus taken to STAT MRI which was negative for an acute stroke. Tnkase was thus not offered anymore. Thrombectomy: not offered, no LVO. NIHSS components Score: Comment  1a Level of Conscious 0[x]$  1[]$  2[]$  3[]$      1b LOC Questions 0[x]$  1[]$  2[]$       1c LOC Commands 0[x]$  1[]$  2[]$       2 Best Gaze 0[]$  1[x]$  2[]$       3 Visual 0[]$  1[]$  2[x]$  3[]$      4 Facial Palsy 0[x]$  1[]$  2[]$  3[]$      5a Motor Arm - left 0[x]$  1[]$  2[]$  3[]$  4[]$  UN[]$    5b Motor Arm - Right 0[x]$  1[]$  2[]$  3[]$  4[]$  UN[]$    6a Motor Leg - Left 0[x]$  1[]$  2[]$  3[]$  4[]$  UN[]$    6b Motor Leg - Right 0[x]$   1[]$  2[]$  3[]$  4[]$  UN[]$    7 Limb Ataxia 0[x]$  1[]$  2[]$  3[]$  UN[]$     8 Sensory 0[]$  1[]$  2[x]$  UN[]$      9 Best Language 0[x]$  1[]$  2[]$  3[]$      10 Dysarthria 0[]$  1[x]$  2[]$  UN[]$      11 Extinct. and Inattention 0[]$  1[]$  2[x]$       TOTAL: 8      ROS   Constitutional Denies weight loss, fever and chills. ***  HEENT Denies changes in vision and hearing. ***  Respiratory Denies SOB and cough. ***  CV Denies palpitations and CP ***  GI Denies abdominal pain, nausea, vomiting and diarrhea. ***  GU Denies dysuria and urinary frequency. ***  MSK Denies myalgia and joint pain. ***  Skin Denies rash and pruritus. ***  Neurological Denies headache and syncope. ***  Psychiatric Denies recent changes in mood. Denies anxiety and depression. ***   Past History   Past Medical History:  Diagnosis Date   Chronic insomnia    Cigarette smoker  COPD (chronic obstructive pulmonary disease) (HCC)    Diverticulosis of colon    Elevated prostate specific antigen (PSA)    ETOH abuse    GERD (gastroesophageal reflux disease)    Hx of colonic polyps    Hyperlipidemia    Hypertension    Pulmonary nodule    Seizures (HCC)    Shortness of breath    Stroke [facility])    Past Surgical History:  Procedure Laterality Date   COLONOSCOPY     ESOPHAGOGASTRODUODENOSCOPY (EGD) WITH PROPOFOL N/A 07/21/2018   Procedure: ESOPHAGOGASTRODUODENOSCOPY (EGD) WITH PROPOFOL;  Surgeon: Lavena Bullion, DO;  Location: [facility];  Service: Gastroenterology;  Laterality: N/A;   needle biopsy RUL nodule  01/2009   benign   TONSILLECTOMY  12-08-45   VASECTOMY     Family History  Problem Relation Age of Onset   Diabetes Father    Lung cancer Brother    Colon cancer Neg Hx    Esophageal cancer Neg Hx    Stomach cancer Neg Hx    Social History   Socioeconomic History   Marital status: Widowed    Spouse name: Mary(deceased 09-Dec-1998)   Number of children: 1   Years of education: Not on file   Highest education level: Not on file   Occupational History   Occupation: retired    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[employer] , Pharmacist, community  Tobacco Use   Smoking status: Every Day    Packs/day: 1.00    Years: 55.00    Total pack years: 55.00    Types: Cigarettes   Smokeless tobacco: Never   Tobacco comments:    not smoking @ SNF  Substance and Sexual Activity   Alcohol use: Yes    Alcohol/week: 24.0 standard drinks of alcohol    Types: 24 Cans of beer per week    Comment: 3-4 daily   Drug use: No   Sexual activity: Never  Other Topics Concern   Not on file  Social History Narrative   1 son that died in a MVA   Social Determinants of Health   Financial Resource Strain: Not on file  Food Insecurity: Not on file  Transportation Needs: Not on file  Physical Activity: Not on file  Stress: Not on file  Social Connections: Not on file   No Known Allergies  Medications  (Not in a hospital admission)    Vitals   Vitals:   11/10/22 December 09, 2130 11/10/22 08-Dec-2132 11/10/22 08-Dec-2133 11/10/22 12/09/2143  BP: (!) 140/72   118/62  Pulse: 96   100  Resp:    13  SpO2: 96% 96%  94%  Weight:   68.6 kg   Height:   5' 11"$  (1.803 m)      Body mass index is 21.09 kg/m.  Physical Exam   General: Laying comfortably in bed; in no acute distress. *** HENT: Normal oropharynx and mucosa. Normal external appearance of ears and nose. *** Neck: Supple, no pain or tenderness *** CV: No JVD. No peripheral edema. *** Pulmonary: Symmetric Chest rise. Normal respiratory effort. *** Abdomen: Soft to touch, non-tender. *** Ext: No cyanosis, edema, or deformity *** Skin: No rash. Normal palpation of skin.  *** Musculoskeletal: Normal digits and nails by inspection. No clubbing. ***  Neurologic Examination  Mental status/Cognition: Alert, oriented to self, place, month and year, good attention. *** Speech/language: Fluent, comprehension intact, object naming intact, repetition intact. *** Cranial nerves:   CN II Pupils equal and reactive to light, no VF deficits  ***  CN III,IV,VI EOM intact, no gaze preference or deviation, no nystagmus ***   CN V normal sensation in V1, V2, and V3 segments bilaterally ***   CN VII no asymmetry, no nasolabial fold flattening ***   CN VIII normal hearing to speech ***   CN IX & X normal palatal elevation, no uvular deviation ***   CN XI 5/5 head turn and 5/5 shoulder shrug bilaterally ***   CN XII midline tongue protrusion ***   Motor:  Muscle bulk: ***, tone ***, pronator drift *** tremor *** Mvmt Root Nerve  Muscle Right Left Comments  SA C5/6 Ax Deltoid     EF C5/6 Mc Biceps     EE C6/7/8 Rad Triceps     WF C6/7 Med FCR     WE C7/8 PIN ECU     F Ab C8/T1 U ADM/FDI     HF L1/2/3 Fem Illopsoas     KE L2/3/4 Fem Quad     DF L4/5 D Peron Tib Ant     PF S1/2 Tibial Grc/Sol      Reflexes:  Right Left Comments  Pectoralis      Biceps (C5/6)     Brachioradialis (C5/6)      Triceps (C6/7)      Patellar (L3/4)      Achilles (S1)      Hoffman      Plantar     Jaw jerk    Sensation:  Light touch    Pin prick    Temperature    Vibration   Proprioception    Coordination/Complex Motor:  - Finger to Nose *** - Heel to shin *** - Rapid alternating movement *** - Gait: Stride length ***. Arm swing ***. Base width ***  Labs   CBC:  Recent Labs  Lab 11/10/22 2127 11/10/22 2131  WBC 10.9*  --   NEUTROABS 8.8*  --   HGB 14.1 13.9  HCT 39.8 41.0  MCV 94.3  --   PLT 211  --     Basic Metabolic Panel:  Lab Results  Component Value Date   NA 136 11/10/2022   K 4.1 11/10/2022   CO2 26 11/10/2022   GLUCOSE 104 (H) 11/10/2022   BUN 26 (H) 11/10/2022   CREATININE 1.70 (H) 11/10/2022   CALCIUM 9.1 11/10/2022   GFRNONAA 39 (L) 11/10/2022   GFRAA 41 (L) 04/22/2020   Lipid Panel:  Lab Results  Component Value Date   LDLCALC 84 03/07/2020   HgbA1c:  Lab Results  Component Value Date   HGBA1C 5.4 09/14/2021   Urine Drug Screen:     Component Value Date/Time   LABOPIA NONE DETECTED  09/14/2021 2140   COCAINSCRNUR NONE DETECTED 09/14/2021 2140   LABBENZ POSITIVE (A) 09/14/2021 2140   AMPHETMU NONE DETECTED 09/14/2021 2140   THCU NONE DETECTED 09/14/2021 2140   LABBARB NONE DETECTED 09/14/2021 2140    Alcohol Level     Component Value Date/Time   ETH <10 09/14/2021 1951    CT Head without contrast(Personally reviewed): ***  CT angio Head and Neck with contrast(Personally reviewed): ***  MR Angio head without contrast and Carotid Duplex BL(Personally reviewed): ***  MRI Brain(Personally reviewed): ***  rEEG:  ***  Impression   LAIKYN DAMAN is a 82 y.o. male with PMH significant for ***. His neurologic examination is notable for ***.  Primary Diagnosis:  {Cerebral Infarction:22351}  Secondary Diagnosis: {Stroke Comorbidities:21266}  Recommendations  *** ______________________________________________________________________   Thank you for the  opportunity to take part in the care of this patient. If you have any further questions, please contact the neurology consultation attending.  Signed,  Donnetta Simpers [facility] Pager Number [phone] _ _ _   _ __   _ __ _ _  __ __   _ __   __ _   Mark Arvind.

## 2022-11-10 NOTE — H&P (Signed)
History and Physical    Blake [name] [id number] DOB: [birth year] DOA: 11/10/2022  PCP: Blake [name], No Pcp Per  Blake [name] coming from: Home  I have personally briefly reviewed Blake [name]'s old medical records in [place]  Chief Complaint: Left-sided neglect, right-sided gaze  HPI: Blake [name] is a 82 y.o. male with medical history significant for chronic A-fib previously on Eliquis but not currently taking, CVA, seizures, CKD stage IIIb, HTN, HLD, tobacco and alcohol use who is admitted for evaluation of left-sided neglect with right-sided gaze.  Blake [name] unable to provide history due to postictal state and is otherwise obtained from EDP and chart review.  Blake [name] lives with a friend who is his POA.  He was found down on the floor with left-sided weakness and vision deficit.  He would only look at on the right side.  Last known normal was around 6-7 PM.  EMS were called due to concern he was either having a stroke or seizure.  EMS noted Blake [name] to have left-sided neglect with right gaze preference and was initially brought in as a code stroke.  Blake [name] seen by neurology.  MRI brain was negative for stroke.  Blake [name] was felt to have a complex seizure with postictal state.  Blake [name] remains postictal at time of admission and unable to participate with communication or exam.  ED Course  Labs/Imaging on admission: I have personally reviewed following labs and imaging studies.  Initial vitals showed BP 118/62, pulse 97, RR 13, temperature not recorded, SpO2 94% on room air.  Labs show WBC 10.9, hemoglobin 14.1, platelets 211,000, sodium 135, potassium 4.0, bicarb 26, BUN 23, creatinine 1.72, serum glucose 106, LFTs within normal limits.  Serum ethanol <10.  UA and UDS pending collection.  CT head negative for acute intracranial process.  CTA head/neck negative for intracranial LVO.  Severe stenosis and a left A3 branch noted.  Multifocal moderate narrowing in the bilateral P2 segments with  focal severe narrowing of the left P2 also seen.  No hemodynamically significant stenosis in the neck.  MRI brain without contrast negative for acute intracranial process.  No evidence of acute or subacute infarct.  Blake [name] was given IV Ativan 2 mg.  Neurology consulted and suspected seizure activity.  Blake [name] was given 2000 mg Keppra followed by 1000 mg twice daily.  The hospitalist service was consulted to admit for further evaluation and management.  Review of Systems:  Unable to obtain full review of systems due to postictal state.   Past Medical History:  Diagnosis Date   Chronic insomnia    Cigarette smoker    COPD (chronic obstructive pulmonary disease) (HCC)    Diverticulosis of colon    Elevated prostate specific antigen (PSA)    ETOH abuse    GERD (gastroesophageal reflux disease)    Hx of colonic polyps    Hyperlipidemia    Hypertension    Pulmonary nodule    Seizures (HCC)    Shortness of breath    Stroke [facility])     Past Surgical History:  Procedure Laterality Date   COLONOSCOPY     ESOPHAGOGASTRODUODENOSCOPY (EGD) WITH PROPOFOL N/A 07/21/2018   Procedure: ESOPHAGOGASTRODUODENOSCOPY (EGD) WITH PROPOFOL;  Surgeon: Lavena Bullion, DO;  Location: [facility];  Service: Gastroenterology;  Laterality: N/A;   needle biopsy RUL nodule  01/2009   benign   TONSILLECTOMY  1947   VASECTOMY      Social History:  reports that he has been smoking cigarettes. He has a 55.00 pack-year  smoking history. He has never used smokeless tobacco. He reports current alcohol use of about 24.0 standard drinks of alcohol per week. He reports that he does not use drugs.  No Known Allergies  Family History  Problem Relation Age of Onset   Diabetes Father    Lung cancer Brother    Colon cancer Neg Hx    Esophageal cancer Neg Hx    Stomach cancer Neg Hx      Prior to Admission medications   Medication Sig Start Date End Date Taking? Authorizing Provider  apixaban (ELIQUIS) 5 MG  TABS tablet Take 1 tablet (5 mg total) by mouth 2 (two) times daily. 04/23/20 09/14/21  Arrien, Jimmy Picket, MD  atorvastatin (LIPITOR) 20 MG tablet Take 1 tablet (20 mg total) by mouth daily. Blake [name] not taking: Reported on 09/14/2021 04/23/20 09/14/21  Arrien, Jimmy Picket, MD  furosemide (LASIX) 40 MG tablet Take 40 mg by mouth daily as needed for fluid or edema.    [provider]  levETIRAcetam (KEPPRA) 750 MG tablet Take 1 tablet (750 mg total) by mouth 2 (two) times daily. 09/15/21   Aline August, MD  lisinopril (ZESTRIL) 5 MG tablet Take 1 tablet (5 mg total) by mouth at bedtime. 09/15/21 10/15/21  Aline August, MD  thiamine 100 MG tablet Take 1 tablet (100 mg total) by mouth daily. 09/15/21   Aline August, MD    Physical Exam: Vitals:   11/10/22 2134 11/10/22 2135 11/10/22 2145 11/10/22 2245  BP:   118/62 124/75  Pulse:   100 90  Resp:   13 18  SpO2: 96%  94% 93%  Weight:  68.6 kg    Height:  5' 11"$  (1.803 m)     Exam limited due to postictal state. Constitutional: Elderly man resting in bed in the left lateral decubitus position, difficult to arouse Eyes: Right pupil irregularly shaped with large ovoid shape, left pupil circular and reactive to light ENMT: Mucous membranes are dry. Posterior pharynx clear of any exudate or lesions.poor dentition.  Neck: normal, supple, no masses. Respiratory: clear to auscultation bilaterally, no wheezing, no crackles. Normal respiratory effort. No accessory muscle use.  Cardiovascular: Irregular, no murmurs / rubs / gallops. No extremity edema. 2+ pedal pulses. Abdomen: no tenderness, no masses palpated. Musculoskeletal: no clubbing / cyanosis. No joint deformity upper and lower extremities.  Skin: no rashes, lesions, ulcers. No induration Neurologic: Nonverbal, not following commands, somnolent.  Sensation appears intact.  Grimaces to sternal rub, withdraws extremities to noxious stimuli. Psychiatric: Somnolent, difficult  to arouse, nonverbal.  EKG: Personally reviewed. Irregular rhythm, motion artifact limits interpretation.  Assessment/Plan Principal Problem:   Seizure [facility]) Active Problems:   Atrial fibrillation, chronic (HCC)   Essential hypertension   COPD (chronic obstructive pulmonary disease) (HCC)   History of CVA (cerebrovascular accident)   Chronic kidney disease, stage 3b ([place])   Alcohol use   SHREYASH MASIN is a 82 y.o. male with medical history significant for chronic A-fib previously on Eliquis, CVA, seizures, CKD stage IIIb, HTN, HLD, tobacco and alcohol use who is admitted with complex seizure.  Assessment and Plan: * Seizure [facility]) Presenting with left-sided weakness/neglect and right-sided gaze.  MRI brain negative for acute CVA.  Neurology consulted and suspect complex seizure with postictal state.  Has been on Keppra 750 mg BID. -Loaded with 2000 mg Keppra -Keppra increased to 1000 mg BID per neurology -Obtain routine EEG -Seizure precautions  Atrial fibrillation, chronic (HCC) Previously on Eliquis but no longer  taking.  Would recommend discussions on restarting once Blake [name]'s mental status improves prior to discharge.  Alcohol use Per roommate he is no longer drinking much alcohol.  Chronic kidney disease, stage 3b ([place]) Renal function slightly worse than prior baseline.  Appears volume depleted.  Placed on gentle IV fluid hydration overnight.  History of CVA (cerebrovascular accident) Initially presenting as a code stroke.  MRI brain negative for acute or subacute infarct.  CTA head/neck negative for emergent LVO or hemodynamically significant stenosis in the neck. -As above recommend further discussions on resuming Eliquis  COPD (chronic obstructive pulmonary disease) (HCC) Stable.  Use albuterol as needed.  Essential hypertension Normotensive on admission.  Resume antihypertensives as needed.  DVT prophylaxis: heparin injection 5,000 Units Start: 11/10/22 2330 Code  Status: Full code Family Communication: Spoke with POA Cathey Endow by phone Disposition Plan: From home, dispo pending clinical progress Consults called: Neurology Severity of Illness: The appropriate Blake [name] status for this Blake [name] is OBSERVATION. Observation status is judged to be reasonable and necessary in order to provide the required intensity of service to ensure the Blake [name]'s safety. The Blake [name]'s presenting symptoms, physical exam findings, and initial radiographic and laboratory data in the context of their medical condition is felt to place them at decreased risk for further clinical deterioration. Furthermore, it is anticipated that the Blake [name] will be medically stable for discharge from the hospital within 2 midnights of admission.   Zada Finders MD [facility]  If 7PM-7AM, please contact night-coverage [email or web address]  11/10/2022, 11:33 PM

## 2022-11-10 NOTE — ED Triage Notes (Signed)
LKW 1830 pm

## 2022-11-10 NOTE — Assessment & Plan Note (Signed)
Normotensive on admission.  Resume antihypertensives as needed.

## 2022-11-10 NOTE — Code Documentation (Signed)
Responded to Code Stroke called at 2107 for R sided gaze, L sided neglect, and unequal pupils, LSN-[id number]. Pt arrived at 2122, CBG-105, NIH-8 for R gaze, L hemianopia, L sided neglect and sensory deficit, and slurred speech. CT head negative for acute changes. CTA-no LVO. D/t improved exam in CT(NIH decreased to 3 for slurred speech and L sided neglect), pt was taken to MRI at 2148. Pt given 48m ativan prior to MRI for agitation. MRI negative for stroke. Code Stroke cancelled at 2204. Plan to tx for seizures.

## 2022-11-10 NOTE — Hospital Course (Signed)
Blake [name] is a 82 y.o. male with medical history significant for chronic A-fib previously on Eliquis, CVA, seizures, CKD stage IIIb, HTN, HLD, tobacco and alcohol use who is admitted with complex seizure.

## 2022-11-10 NOTE — Assessment & Plan Note (Signed)
Initially presenting as a code stroke.  MRI brain negative for acute or subacute infarct.  CTA head/neck negative for emergent LVO or hemodynamically significant stenosis in the neck. -As above recommend further discussions on resuming Eliquis

## 2022-11-10 NOTE — ED Provider Notes (Signed)
[facility] Provider Note   CSN: [id number] Arrival date & time: 11/10/22  2122  An emergency department physician performed an initial assessment on this suspected stroke patient at 2122.  History  Chief Complaint  Patient presents with   Code Stroke    Blake [name] is a 82 y.o. male.  Patient arrives as a code stroke, last known normal at 6 PM.  Arrives with left-sided neglect, right-sided gaze deviation.  History of seizures, COPD, high cholesterol, alcohol abuse and stroke.  Sounds like he is not taking his Eliquis.  Friend that he lives with on the phone says that he was last normal at 6 PM.  May be some abnormality to his right pupil per EMS but not sure if that is chronic or not.  No seizure-like activity with EMS.  He is awake and alert and talking.  The history is provided by the patient.       Home Medications Prior to Admission medications   Medication Sig Start Date End Date Taking? Authorizing Provider  apixaban (ELIQUIS) 5 MG TABS tablet Take 1 tablet (5 mg total) by mouth 2 (two) times daily. 04/23/20 09/14/21  Arrien, Jimmy Picket, MD  atorvastatin (LIPITOR) 20 MG tablet Take 1 tablet (20 mg total) by mouth daily. Patient not taking: Reported on 09/14/2021 04/23/20 09/14/21  Arrien, Jimmy Picket, MD  furosemide (LASIX) 40 MG tablet Take 40 mg by mouth daily as needed for fluid or edema.    [provider]  levETIRAcetam (KEPPRA) 750 MG tablet Take 1 tablet (750 mg total) by mouth 2 (two) times daily. 09/15/21   Aline August, MD  lisinopril (ZESTRIL) 5 MG tablet Take 1 tablet (5 mg total) by mouth at bedtime. 09/15/21 10/15/21  Aline August, MD  thiamine 100 MG tablet Take 1 tablet (100 mg total) by mouth daily. 09/15/21   Aline August, MD      Allergies    Patient has no known allergies.    Review of Systems   Review of Systems  Physical Exam Updated Vital Signs BP 118/62   Pulse 100   Resp  13   Ht 5' 11"$  (1.803 m)   Wt 68.6 kg   SpO2 94%   BMI 21.09 kg/m  Physical Exam Vitals and nursing note reviewed.  Constitutional:      General: He is in acute distress.     Appearance: He is well-developed. He is not ill-appearing.  HENT:     Head: Normocephalic and atraumatic.     Nose: Nose normal.     Mouth/Throat:     Mouth: Mucous membranes are moist.  Eyes:     Extraocular Movements: Extraocular movements intact.     Conjunctiva/sclera: Conjunctivae normal.     Pupils: Pupils are equal, round, and reactive to light.  Cardiovascular:     Rate and Rhythm: Normal rate and regular rhythm.     Pulses: Normal pulses.     Heart sounds: Normal heart sounds. No murmur heard. Pulmonary:     Effort: Pulmonary effort is normal. No respiratory distress.     Breath sounds: Normal breath sounds.  Abdominal:     General: Abdomen is flat.     Palpations: Abdomen is soft.     Tenderness: There is no abdominal tenderness.  Musculoskeletal:        General: No swelling.     Cervical back: Normal range of motion and neck supple.  Skin:  General: Skin is warm and dry.     Capillary Refill: Capillary refill takes less than 2 seconds.  Neurological:     Mental Status: He is alert.     Comments: Left-sided numbness, left-sided neglect, right-sided gaze preference, what appears to be surgical pupil in the right, appears to have grossly normal strength and moves all extremities, speech is clear  Psychiatric:        Mood and Affect: Mood normal.     ED Results / Procedures / Treatments   Labs (all labs ordered are listed, but only abnormal results are displayed) Labs Reviewed  CBC - Abnormal; Notable for the following components:      Result Value   WBC 10.9 (*)    All other components within normal limits  DIFFERENTIAL - Abnormal; Notable for the following components:   Neutro Abs 8.8 (*)    All other components within normal limits  COMPREHENSIVE METABOLIC PANEL - Abnormal;  Notable for the following components:   Glucose, Bld 106 (*)    Creatinine, Ser 1.72 (*)    GFR, Estimated 39 (*)    All other components within normal limits  I-STAT CHEM 8, ED - Abnormal; Notable for the following components:   BUN 26 (*)    Creatinine, Ser 1.70 (*)    Glucose, Bld 104 (*)    Calcium, Ion 1.06 (*)    All other components within normal limits  CBG MONITORING, ED - Abnormal; Notable for the following components:   Glucose-Capillary 105 (*)    All other components within normal limits  ETHANOL  PROTIME-INR  APTT  RAPID URINE DRUG SCREEN, HOSP PERFORMED  URINALYSIS, ROUTINE W REFLEX MICROSCOPIC  CBG MONITORING, ED    EKG None  Radiology MR BRAIN WO CONTRAST  Result Date: 11/10/2022 CLINICAL DATA:  Stroke suspected EXAM: MRI HEAD WITHOUT CONTRAST TECHNIQUE: Multiplanar, multiecho pulse sequences of the brain and surrounding structures were obtained without intravenous contrast. COMPARISON:  09/14/2021 MRI head FINDINGS: Evaluation is somewhat limited as the sagittal T1 could not be obtained due to patient motion. Brain: No restricted diffusion to suggest acute or subacute infarct. No acute hemorrhage, mass, mass effect, or midline shift. No hydrocephalus or extra-axial collection. Advanced cerebral volume loss for age, with ex vacuo dilatation of the ventricles. Confluent and scattered T2 hyperintense signal in the periventricular white matter, likely the sequela of chronic small vessel ischemic disease. Vascular: Normal arterial flow voids. Skull and upper cervical spine: Normal marrow signal. Sinuses/Orbits: Clear paranasal sinuses. No acute finding in the orbits. Status post bilateral lens replacements. Other: The mastoid air cells are well aerated. IMPRESSION: No acute intracranial process. No evidence of acute or subacute infarct. Electronically Signed   By: Merilyn Baba M.D.   On: 11/10/2022 22:24   CT ANGIO HEAD NECK W WO CM (CODE STROKE)  Result Date:  11/10/2022 CLINICAL DATA:  Left-sided neglect, weakness, right gaze deviation EXAM: CT ANGIOGRAPHY HEAD AND NECK TECHNIQUE: Multidetector CT imaging of the head and neck was performed using the standard protocol during bolus administration of intravenous contrast. Multiplanar CT image reconstructions and MIPs were obtained to evaluate the vascular anatomy. Carotid stenosis measurements (when applicable) are obtained utilizing NASCET criteria, using the distal internal carotid diameter as the denominator. RADIATION DOSE REDUCTION: This exam was performed according to the departmental dose-optimization program which includes automated exposure control, adjustment of the mA and/or kV according to patient size and/or use of iterative reconstruction technique. CONTRAST:  85m OMNIPAQUE IOHEXOL 350  MG/ML SOLN COMPARISON:  09/14/2021 CTA head neck, correlation is also made with CT head 11/10/2021 FINDINGS: CT HEAD FINDINGS For noncontrast findings, please see same day CT head. CTA NECK FINDINGS Aortic arch: Two-vessel arch with a common origin of the brachiocephalic and left common carotid arteries. Imaged portion shows no evidence of aneurysm or dissection. No significant stenosis of the major arch vessel origins. Aortic atherosclerosis Right carotid system: No evidence of dissection, occlusion, or hemodynamically significant stenosis (greater than 50%). Atherosclerotic disease at the bifurcation and in the proximal ICA is not hemodynamically significant. Left carotid system: No evidence of dissection, occlusion, or hemodynamically significant stenosis (greater than 50%). Atherosclerotic disease at the bifurcation and in the proximal ICA is not hemodynamically significant. Vertebral arteries: Right dominant system. No evidence of dissection, occlusion, or hemodynamically significant stenosis (greater than 50%). Skeleton: No acute osseous abnormality. Degenerative changes in the cervical spine edentulous. Other neck:  Hypoenhancing nodules in the thyroid, the largest of which measures up to 1.2 cm, for which no follow-up is currently indicated. (Reference: J Am Coll Radiol. 2015 Feb;12(2): 143-50) redemonstrated neck lipoma. Upper chest: Emphysema.  No pleural effusion. Review of the MIP images confirms the above findings CTA HEAD FINDINGS Anterior circulation: Both internal carotid arteries are patent to the termini, with mild stenosis in the bilateral cavernous and supraclinoid ICAs. A1 segments patent. Normal anterior communicating artery. Severe stenosis in a left A3 branch (series 12, image 65). Anterior cerebral arteries are otherwise patent to their distal aspects. No M1 stenosis or occlusion. Mild stenosis in a left M2 (series 12, images 99-100). MCA branches otherwise perfused and symmetric. Posterior circulation: Vertebral arteries patent to the vertebrobasilar junction without stenosis. Posterior inferior cerebellar arteries patent proximally. Basilar patent to its distal aspect. Suspect a small fenestration in the mid to distal basilar artery (series 13, image 118 superior cerebellar arteries patent proximally. Patent P1 segments, hypoplastic on the right. Near fetal origin of the right PCA, with moderate stenosis at the right posterior communicating artery origin (series 12, image 104). Multifocal moderate narrowing in the bilateral P2 segments, with additional focal severe narrowing in the left P2 (series 13, image 126). Venous sinuses: As permitted by contrast timing, patent. Anatomic variants: Near fetal origin of the right PCA. Review of the MIP images confirms the above findings IMPRESSION: 1. No intracranial large vessel occlusion. Severe stenosis in a left A3 branch. Multifocal moderate narrowing in the bilateral P2 segments, with additional focal severe narrowing in the left P2. 2. Mild stenosis in the bilateral cavernous and supraclinoid ICAs. 3. No hemodynamically significant stenosis in the neck. 4. Aortic  atherosclerosis. Aortic Atherosclerosis (ICD10-I70.0). Electronically Signed   By: Merilyn Baba M.D.   On: 11/10/2022 22:09   CT HEAD CODE STROKE WO CONTRAST  Result Date: 11/10/2022 CLINICAL DATA:  Code stroke. EXAM: CT HEAD WITHOUT CONTRAST TECHNIQUE: Contiguous axial images were obtained from the base of the skull through the vertex without intravenous contrast. RADIATION DOSE REDUCTION: This exam was performed according to the departmental dose-optimization program which includes automated exposure control, adjustment of the mA and/or kV according to patient size and/or use of iterative reconstruction technique. COMPARISON:  09/14/2021 FINDINGS: Brain: No evidence of acute infarction, hemorrhage, mass, mass effect, or midline shift. No hydrocephalus or extra-axial collection. Mildly advanced cerebral atrophy for age. Periventricular white matter changes, likely the sequela of chronic small vessel ischemic disease. Vascular: No hyperdense vessel. Skull: Negative for fracture or focal lesion. Sinuses/Orbits: No acute finding. Status post bilateral  lens replacements. Other: The mastoid air cells are well aerated. ASPECTS [facility] Stroke Program Early CT Score) - Ganglionic level infarction (caudate, lentiform nuclei, internal capsule, insula, M1-M3 cortex): 7 - Supraganglionic infarction (M4-M6 cortex): 3 Total score (0-10 with 10 being normal): 10 IMPRESSION: 1. No acute intracranial process. 2. ASPECTS is 10. Imaging results were communicated on 11/10/2022 at 9:37 pm to provider Dr. Lorrin Goodell via secure text paging. Electronically Signed   By: Merilyn Baba M.D.   On: 11/10/2022 21:38    Procedures Procedures    Medications Ordered in ED Medications  levETIRAcetam (KEPPRA) IVPB 1000 mg/100 mL premix (has no administration in time range)  levETIRAcetam (KEPPRA) IVPB 1000 mg/100 mL premix (has no administration in time range)    Or  levETIRAcetam (KEPPRA) tablet 1,000 mg (has no administration in  time range)  iohexol (OMNIPAQUE) 350 MG/ML injection 60 mL (60 mLs Intravenous Contrast Given 11/10/22 2147)  LORazepam (ATIVAN) injection 2 mg (2 mg Intravenous Given 11/10/22 2154)    ED Course/ Medical Decision Making/ A&P                             Medical Decision Making Amount and/or Complexity of Data Reviewed Labs: ordered. Radiology: ordered.  Risk Prescription drug management.   Blake [name] is here as a code stroke.  Normal vitals.  No fever.  History of seizures and stroke.  He is not on blood thinners.  Left-sided neglect, right-sided gaze deviation.  Appears to have a surgical pupil in the right eye.  While he was on CT his symptoms seem to have resolved.  Differential diagnosis is seizure versus stroke.  He had CT imaging and MRI that were unremarkable and negative for stroke per neurology team.  Dr. Raliegh Ip recommends that we increase his Keppra and admit for observation as his suspicion is that this was complex seizure.  Blood work was unremarkable per my review and interpretation.  No significant anemia or electrolyte abnormality or kidney injury.  Overall no stroke and seems less likely to be TIA.  Will observe him overnight make adjustments to his antiepileptics and his other medications.  Admitted to medicine for further care.  This chart was dictated using voice recognition software.  Despite best efforts to proofread,  errors can occur which can change the documentation meaning.         Final Clinical Impression(s) / ED Diagnoses Final diagnoses:  Seizure [facility])    Rx / [facility] Orders ED Discharge Orders     None         Lennice Sites, DO 11/10/22 2229

## 2022-11-11 ENCOUNTER — Observation Stay (HOSPITAL_COMMUNITY): Payer: Medicare Other

## 2022-11-11 DIAGNOSIS — R569 Unspecified convulsions: Secondary | ICD-10-CM | POA: Diagnosis not present

## 2022-11-11 LAB — BASIC METABOLIC PANEL
Anion gap: 12 (ref 5–15)
BUN: 21 mg/dL (ref 8–23)
CO2: 24 mmol/L (ref 22–32)
Calcium: 8.6 mg/dL — ABNORMAL LOW (ref 8.9–10.3)
Chloride: 98 mmol/L (ref 98–111)
Creatinine, Ser: 1.57 mg/dL — ABNORMAL HIGH (ref 0.61–1.24)
GFR, Estimated: 44 mL/min — ABNORMAL LOW (ref >60)
Glucose, Bld: 90 mg/dL (ref 70–99)
Potassium: 4.3 mmol/L (ref 3.5–5.1)
Sodium: 134 mmol/L — ABNORMAL LOW (ref 135–145)

## 2022-11-11 LAB — URINALYSIS, ROUTINE W REFLEX MICROSCOPIC
Bacteria, UA: NONE SEEN
Bilirubin Urine: NEGATIVE
Glucose, UA: NEGATIVE mg/dL
Ketones, ur: NEGATIVE mg/dL
Leukocytes,Ua: NEGATIVE
Nitrite: NEGATIVE
Protein, ur: NEGATIVE mg/dL
Specific Gravity, Urine: 1.035 — ABNORMAL HIGH (ref 1.005–1.030)
pH: 5 (ref 5.0–8.0)

## 2022-11-11 LAB — CBC
HCT: 38 % — ABNORMAL LOW (ref 39.0–52.0)
Hemoglobin: 13.4 g/dL (ref 13.0–17.0)
MCH: 33 pg (ref 26.0–34.0)
MCHC: 35.3 g/dL (ref 30.0–36.0)
MCV: 93.6 fL (ref 80.0–100.0)
Platelets: 209 10*3/uL (ref 150–400)
RBC: 4.06 MIL/uL — ABNORMAL LOW (ref 4.22–5.81)
RDW: 13 % (ref 11.5–15.5)
WBC: 11.2 10*3/uL — ABNORMAL HIGH (ref 4.0–10.5)
nRBC: 0 % (ref 0.0–0.2)

## 2022-11-11 LAB — RAPID URINE DRUG SCREEN, HOSP PERFORMED
Amphetamines: NOT DETECTED
Barbiturates: NOT DETECTED
Benzodiazepines: NOT DETECTED
Cocaine: NOT DETECTED
Opiates: NOT DETECTED
Tetrahydrocannabinol: NOT DETECTED

## 2022-11-11 MED ORDER — ACETAMINOPHEN 325 MG PO TABS: 650.0000 mg | ORAL_TABLET | Freq: Four times a day (QID) | ORAL | Status: DC | PRN

## 2022-11-11 MED ORDER — ATORVASTATIN CALCIUM 10 MG PO TABS
20.0000 mg | ORAL_TABLET | Freq: Every day | ORAL | Status: DC
  Administered 2022-11-11 – 2022-11-12 (×2): 20 mg via ORAL
  Filled 2022-11-11 (×2): qty 2

## 2022-11-11 NOTE — Progress Notes (Signed)
Rueben Lins Antonelli  [id number] DOB: 1941/06/26 DOA: 11/10/2022 PCP: Patient, No Pcp Per    Brief Narrative:  82 year old with a history of chronic atrial fibrillation not on anticoagulation, CVA, seizure disorder, CKD stage IIIb, HTN, HLD, and tobacco and alcohol use who was found down on the floor by a friend who lives with.  He reported left-sided weakness and would only gaze to the right.  The patient was brought in by EMS as a code stroke but MRI brain was negative for CVA.  Neurology felt this likely represented a complex seizure with a postictal Todd's paralysis.  CT head was also negative for any acute intracranial process.  Consultants:  Neurology  Goals of Care:  Code Status: Full Code   DVT prophylaxis: SCDs  Interim Hx: Afebrile.  Vital signs stable.  Pleasant and interactive but slightly confused at the time on presentation.  Complains of pain in his left face and around his eye.  No shortness of breath or chest pain.  Assessment & Plan:  Acute breakthrough seizure with history of seizure disorder presented with left-sided weakness/neglect and right gaze preference -CT and MRI head without evidence of acute findings -patient loaded with 2 g of Keppra and baseline Keppra dose increased to 1000 mg twice daily -ongoing care per Neurology  Unwitnessed fall The patient was found facedown on the floor in his ER room with swelling to his left forehead and around the left eye -CT head and C-spine was without acute findings -subcu heparin for DVT prophylaxis discontinued for now  Chronic atrial fibrillation Is not on Eliquis which it appears the patient has stopped on his own - Eliquis to resume at d/c after discussion with patient  CKD stage IIIb Gently hydrating -creatinine has improved from 1.72 to 1.57 during hospital stay  History of CVA No evidence of acute CVA this admission  COPD Well compensated presently  HTN Blood pressure well-controlled at this time  Family  Communication: The patient's roommate is his power of attorney by report Disposition: Lives independently with a roommate who helps look out for him -PT/OT evaluations pending but suspect patient will be able to return to this environment 2/16   Objective: Blood pressure 134/62, pulse 65, temperature 98.6 F (37 C), temperature source Oral, resp. rate 16, height 5' 11"$  (1.803 m), weight 68.6 kg, SpO2 99 %.  Intake/Output Summary (Last 24 hours) at 11/11/2022 0835 Last data filed at 11/11/2022 0649 Gross per 24 hour  Intake 701.77 ml  Output 100 ml  Net 601.77 ml   Filed Weights   11/10/22 2100 11/10/22 2135  Weight: 68.6 kg 68.6 kg    Examination: General: No acute respiratory distress Lungs: Clear to auscultation bilaterally without wheezes or crackles Cardiovascular: Regular rate without murmur gallop or rub  Abdomen: Nontender, nondistended, soft, bowel sounds positive, no rebound, no ascites, no appreciable mass Extremities: No significant cyanosis, clubbing, or edema bilateral lower extremities  CBC: Recent Labs  Lab 11/10/22 2127 11/10/22 2131 11/11/22 0619  WBC 10.9*  --  11.2*  NEUTROABS 8.8*  --   --   HGB 14.1 13.9 13.4  HCT 39.8 41.0 38.0*  MCV 94.3  --  93.6  PLT 211  --  [id number]   Basic Metabolic Panel: Recent Labs  Lab 11/10/22 2127 11/10/22 2131 11/11/22 0619  NA 135 136 134*  K 4.0 4.1 4.3  CL 100 99 98  CO2 26  --  24  GLUCOSE 106* 104* 90  BUN 23 26*  21  CREATININE 1.72* 1.70* 1.57*  CALCIUM 9.1  --  8.6*   GFR: Estimated Creatinine Clearance: 35.8 mL/min (A) (by C-G formula based on SCr of 1.57 mg/dL (H)).   Scheduled Meds:  levETIRAcetam  1,000 mg Oral Q12H   Continuous Infusions:  sodium chloride 75 mL/hr (11/11/22 0649)   levETIRAcetam Stopped (11/10/22 2328)     LOS: 0 days   Cherene Altes, MD [facility] Office  [phone] Pager - Text Page per Amion  If 7PM-7AM, please contact night-coverage per  Amion 11/11/2022, 8:35 AM

## 2022-11-11 NOTE — Procedures (Addendum)
Patient Name: Blake [name]  MRN: [id number]  Epilepsy Attending: Lora Havens  Referring Physician/Provider: Lenore Cordia, MD  Date: 11/11/2022 Duration: 25.34 mins  Patient history: 82 y.o. male with medical history significant for chronic A-fib previously on Eliquis but not currently taking, CVA, seizures, CKD stage IIIb, HTN, HLD, tobacco and alcohol use who is admitted for evaluation of left-sided neglect with right-sided gaze. EEG to evaluate for seizure.  Level of alertness: Awake, asleep  AEDs during EEG study: LEV  Technical aspects: This EEG study was done with scalp electrodes positioned according to the 10-20 International system of electrode placement. Electrical activity was reviewed with band pass filter of 1-70Hz$ , sensitivity of 7 uV/mm, display speed of 65m/sec with a 60Hz$  notched filter applied as appropriate. EEG data were recorded continuously and digitally stored.  Video monitoring was available and reviewed as appropriate.  Description: The posterior dominant rhythm consists of 9-10 Hz activity of moderate voltage (25-35 uV) seen predominantly in posterior head regions, asymmetric( right<left) and reactive to eye opening and eye closing. Sleep was characterized by vertex waves, sleep spindles (12 to 14 Hz), maximal frontocentral region.  EEG showed continuous low amplitude 2 to 3 Hz delta slowing in right hemisphere.  Hyperventilation and photic stimulation were not performed.     ABNORMALITY - Continuous slow, right hemisphere - Background asymmetry, right<left  IMPRESSION: This study is suggestive of cortical dysfunction in right hemisphere likely secondary to underlying structural abnormality or postictal state.  No seizures or epileptiform discharges were seen throughout the recording.  [name]

## 2022-11-11 NOTE — Care Management Obs Status (Signed)
[name] NOTIFICATION   Patient Details  Name: Blake [name] MRN: [id number] Date of Birth: March 01, 1941   Medicare Observation Status Notification Given:  Yes    Verdell Carmine, RN 11/11/2022, 5:11 PM

## 2022-11-11 NOTE — ED Notes (Signed)
Patient found down on the floor. Response to  verbal stimuli. Md called and arrives to at bed bed side immediately. C-collar applied and transferred to bed with spine board.patient shows  hematoma on left side of head. Imagining  ordered. No active bleeding noted.

## 2022-11-11 NOTE — Evaluation (Signed)
Physical Therapy Evaluation Patient Details Name: Blake [name] MRN: [id number] DOB: 12-Dec-1940 Today's Date: 11/11/2022  History of Present Illness  82 year old with a history of chronic atrial fibrillation not on anticoagulation, CVA, seizure disorder, CKD stage IIIb, HTN, HLD, and tobacco and alcohol found down at home and admitted with concern for stroke, but MRI negative, treating for seizure with possible Todd's paralysis.  Clinical Impression  Patient presents with mobility limited due to decreased balance, decreased strength and decreased deficit awareness.  Previously living with "caregivers" in mobile home with some help for ADL's.  States they are available 24/7.  Likely will continue to improve and be able to d/c home with support and HHPT.        Recommendations for follow up therapy are one component of a multi-disciplinary discharge planning process, led by the attending physician.  Recommendations may be updated based on patient status, additional functional criteria and insurance authorization.  Follow Up Recommendations Home health PT      Assistance Recommended at Discharge Intermittent Supervision/Assistance  Patient can return home with the following  A little help with walking and/or transfers;A lot of help with bathing/dressing/bathroom;A little help with bathing/dressing/bathroom;Direct supervision/assist for financial management;Direct supervision/assist for medications management;Help with stairs or ramp for entrance;Assist for transportation    Equipment Recommendations None recommended by PT  Recommendations for Other Services       Functional Status Assessment Patient has had a recent decline in their functional status and demonstrates the ability to make significant improvements in function in a reasonable and predictable amount of time.     Precautions / Restrictions Precautions Precautions: Fall      Mobility  Bed Mobility Overal bed mobility: Needs  Assistance Bed Mobility: Supine to Sit, Sit to Supine     Supine to sit: Min guard Sit to supine: Min guard   General bed mobility comments: assist for lines and safety    Transfers Overall transfer level: Needs assistance Equipment used: None Transfers: Sit to/from Stand Sit to Stand: Min guard           General transfer comment: up impulsively prior to line management, cues for safety    Ambulation/Gait Ambulation/Gait assistance: Min assist Gait Distance (Feet): 20 Feet Assistive device: None, 1 person hand held assist Gait Pattern/deviations: Step-to pattern, Step-through pattern, Decreased stride length, Shuffle, Wide base of support       General Gait Details: broad BOS and imbalance evident with min A to steady; limited due to pt with bradycardia when in bed and RN concerned about needing CPAP  Stairs            Wheelchair Mobility    Modified Rankin (Stroke Patients Only) Modified Rankin (Stroke Patients Only) Pre-Morbid Rankin Score: Moderate disability Modified Rankin: Moderately severe disability     Balance Overall balance assessment: Needs assistance Sitting-balance support: Feet supported Sitting balance-Leahy Scale: Fair     Standing balance support: Single extremity supported Standing balance-Leahy Scale: Poor Standing balance comment: support for balance in standing                             Pertinent Vitals/Pain Pain Assessment Pain Assessment: No/denies pain    Home Living Family/patient expects to be discharged to:: Private residence Living Arrangements: Other (Comment) Available Help at Discharge: Personal care attendant Type of Home: Mobile home Home Access: Stairs to enter   Entrance Stairs-Number of Steps: 2   Home Layout: One  level Home Equipment: (P) Grab bars - tub/shower      Prior Function Prior Level of Function : Needs assist               ADLs Comments: reports has help for ADL/IADL      Hand Dominance   Dominant Hand: Right    Extremity/Trunk Assessment   Upper Extremity Assessment Upper Extremity Assessment: Generalized weakness    Lower Extremity Assessment Lower Extremity Assessment: Generalized weakness    Cervical / Trunk Assessment Cervical / Trunk Assessment: Kyphotic  Communication   Communication: HOH  Cognition Arousal/Alertness: Awake/alert Behavior During Therapy: WFL for tasks assessed/performed Overall Cognitive Status: No family/caregiver present to determine baseline cognitive functioning                                 General Comments: appropriately asking about a meal, but figured it should be breakfast, knew had a seizure, but asking about a stroke.        General Comments General comments (skin integrity, edema, etc.): HR in 70's after ambulation noted in 30's initially with pt lethargic and RN in the room    Exercises     Assessment/Plan    PT Assessment Patient needs continued PT services  PT Problem List Decreased strength;Decreased balance;Decreased cognition;Decreased mobility;Decreased safety awareness;Decreased knowledge of use of DME;Decreased activity tolerance       PT Treatment Interventions Functional mobility training;Balance training;Patient/family education;Gait training;Stair training;Therapeutic exercise;Therapeutic activities;Neuromuscular re-education    PT Goals (Current goals can be found in the Care Plan section)  Acute Rehab PT Goals Patient Stated Goal: to return home PT Goal Formulation: With patient Time For Goal Achievement: 11/25/22 Potential to Achieve Goals: Good    Frequency Min 3X/week     Co-evaluation               AM-PAC PT "6 Clicks" Mobility  Outcome Measure Help needed turning from your back to your side while in a flat bed without using bedrails?: A Little Help needed moving from lying on your back to sitting on the side of a flat bed without using  bedrails?: A Little Help needed moving to and from a bed to a chair (including a wheelchair)?: A Little Help needed standing up from a chair using your arms (e.g., wheelchair or bedside chair)?: A Little Help needed to walk in hospital room?: A Lot Help needed climbing 3-5 steps with a railing? : Total 6 Click Score: 15    End of Session Equipment Utilized During Treatment: Gait belt Activity Tolerance: Patient limited by fatigue Patient left: in bed;with bed alarm set;with call bell/phone within reach Nurse Communication: Mobility status PT Visit Diagnosis: Other abnormalities of gait and mobility (R26.89);Other symptoms and signs involving the nervous system (R29.898)    Time: 1510-1535 PT Time Calculation (min) (ACUTE ONLY): 25 min   Charges:   PT Evaluation $PT Eval Moderate Complexity: 1 Mod PT Treatments $Gait Training: 8-22 mins        Blake [name], PT Acute Rehabilitation Services [phone] 11/11/2022   Blake [name] 11/11/2022, 3:55 PM

## 2022-11-11 NOTE — Progress Notes (Signed)
Subjective: Feels normal, denies neck pain Denies diplopia  Exam: Vitals:   11/11/22 0433 11/11/22 0713  BP: (!) 141/77 134/62  Pulse: 98 65  Resp: 20 16  Temp: 98.3 F (36.8 C) 98.6 F (37 C)  SpO2: 97% 99%   Gen: In bed, NAD Resp: non-labored breathing, no acute distress Abd: soft, nt  Neuro: MS: Awake, alert, oriented to age month and place CN: Right pupil is postsurgical, visual fields are full, EOMI Motor: No drift in either upper extremity, 5/5 strength throughout Sensory: Intact to light touch, no extinction to double simultaneous stimulation  Pertinent Labs: GFR 44  Impression: 82 year old male with breakthrough seizure.  He has a history of prolonged postictal left-sided deficits, with an similar admission in 2022.  He has been increased to Keppra 1 g twice daily which is slightly high for his kidney function, but he seems to be tolerating it well.  Rather than adding a second agent, I would favor continuing this to see if he maintains seizure control or has side effects.  He will need follow-up.  He was also started on anticoagulation given his history of atrial fibrillation, I am not certain why he was not on it before and neither is the patient, but expresses a desire to continue it.  Recommendations: 1) continue Keppra 1 g twice daily 2) follow-up with outpatient neurology 3) neurology will be available on an as-needed basis.  Roland Rack, MD [facility] [phone]  If 7pm- 7am, please page neurology on call as listed in [place].

## 2022-11-11 NOTE — TOC Initial Note (Addendum)
Transition of Care [facility]) - Initial/Assessment Note    Patient Details  Name: Blake [name] MRN: [id number] Date of Birth: [birth year]  [facility]) CM/SW Contact:    Verdell Carmine, RN Phone Number: 11/11/2022, 5:03 PM  Clinical Narrative:                 Patient presented with potential seizure .  Broached home health with patient he is very somnolent, lethargic . Called POA, roommate and asked him about it . The patient has refused home health before POA Mr. [name] wants to hold off on this for now. MOON notice given to POA  TOC will follow for needs, recommendation and transitions of care 1715 conversed with RN  as the POA called her and said he has done OP PT before and he can get Arshaan there. PT will reassess tomorrow and can update recommendations if warranted.  Expected Discharge Plan: [place] Barriers to Discharge: Continued Medical Work up   Patient Goals and CMS Choice            Expected Discharge Plan and Services   Discharge Planning Services: CM Consult                                          Prior Living Arrangements/Services     Patient language and need for interpreter reviewed:: Yes        Need for Family Participation in Patient Care: Yes (Comment) Care giver support system in place?: Yes (comment)   Criminal Activity/Legal Involvement Pertinent to Current Situation/Hospitalization: No - Comment as needed  Activities of Daily Living   ADL Screening (condition at time of admission) Patient's cognitive ability adequate to safely complete daily activities?: Yes Is the patient deaf or have difficulty hearing?: Yes Does the patient have difficulty seeing, even when wearing glasses/contacts?: Yes Does the patient have difficulty concentrating, remembering, or making decisions?: Yes Patient able to express need for assistance with ADLs?: Yes Does the patient have difficulty dressing or bathing?:  Yes Independently performs ADLs?: No Communication: Independent Dressing (OT): Needs assistance Is this a change from baseline?: Change from baseline, expected to last >3 days Grooming: Needs assistance Is this a change from baseline?: Change from baseline, expected to last >3 days Feeding: Independent Bathing: Needs assistance Is this a change from baseline?: Pre-admission baseline Toileting: Needs assistance Is this a change from baseline?: Pre-admission baseline In/Out Bed: Needs assistance Is this a change from baseline?: Change from baseline, expected to last >3 days  Permission Sought/Granted                  Emotional Assessment Appearance:: Appears younger than stated age Attitude/Demeanor/Rapport: Lethargic Affect (typically observed): Quiet Orientation: : Fluctuating Orientation (Suspected and/or reported Sundowners) Alcohol / Substance Use: Alcohol Use Psych Involvement: No (comment)  Admission diagnosis:  Seizure [facility]) [R56.9] Patient Active Problem List   Diagnosis Date Noted   Chronic kidney disease, stage 3b ([place]) 11/10/2022   Alcohol use 11/10/2022   History of CVA (cerebrovascular accident) 09/14/2021   Atrial fibrillation, chronic ([place]) 09/14/2021   Alcohol withdrawal ([place]) 04/22/2020   Seizure ([place]) 04/20/2020   Leukocytosis 03/13/2020   Stroke-like symptoms s/p tPA - ? TIA vs aborted stroke vs seizure 03/06/2020   Acute CVA (cerebrovascular accident) ([place]) 01/10/2019   Esophageal stricture    Abnormal finding on GI  tract imaging    Dysphagia 07/20/2018   Malnutrition of moderate degree 07/10/2018   Aspiration pneumonia ([place]) 07/07/2018   Seizure disorder ([place]) 07/07/2018   HLD (hyperlipidemia) 07/07/2018   Acute hypoxemic respiratory failure ([place]) 07/07/2018   Encounter for intubation    Acute respiratory failure with hypercapnia ([place])    HCAP (healthcare-associated pneumonia)    Protein-calorie malnutrition, severe 06/26/2018   Alcohol  withdrawal seizure ([place]) 06/25/2018   New onset seizure ([place]) 01/07/2018   Hypertension 01/07/2018   GERD (gastroesophageal reflux disease) 01/07/2018   Hyperlipidemia 01/07/2018   Cigarette smoker 01/07/2018   Alcohol abuse 01/07/2018   Acute alcohol abuse, with delirium ([place])    Acute encephalopathy 10/14/2014   Unsteady gait 10/14/2014   Alcohol withdrawal delirium ([place]) 10/14/2014   Lipoma of neck 12/22/2012   Cerebral atrophy ([place]) 09/28/2012   Cerebrovascular small vessel disease 09/28/2012   Left humeral fracture 07/04/2012   Delirium 07/04/2012   ETOH abuse    ELEVATED PROSTATE SPECIFIC ANTIGEN 09/30/2010   COPD (chronic obstructive pulmonary disease) ([place]) 01/28/2009   PULMONARY NODULE 01/28/2009   Tobacco use 02/07/2008   INSOMNIA, CHRONIC 10/15/2007   COLONIC POLYPS 10/10/2007   Type 2 diabetes mellitus ([place]) 10/10/2007   Dyslipidemia 10/10/2007   Essential hypertension 10/10/2007   GERD 10/10/2007   DIVERTICULOSIS OF COLON 10/10/2007   PCP:  Patient, No Pcp Per Pharmacy:   [id number] [place],  - [address] [place] [address]Phone: [phone]: [phone]    Social Determinants of Health (SDOH) Social History: SDOH Screenings   Tobacco Use: High Risk (11/10/2022)   SDOH Interventions:     Readmission Risk Interventions     No data to display

## 2022-11-11 NOTE — Progress Notes (Signed)
EEG complete - results pending 

## 2022-11-11 NOTE — Progress Notes (Signed)
Patient not available for EEG at the moment , moving to inpatient room. Per nurse

## 2022-11-11 NOTE — ED Notes (Signed)
Spoke with patient's  power of attorney and notified about patient fall

## 2022-11-11 NOTE — Significant Event (Signed)
I was notified by the ED physician that patient had an unwitnessed fall.  Patient was found facedown on the floor in the ED room.  He was noted to have swelling to his left forehead and around his left eye.  Previously was very somnolent but now awake and somewhat communicative.  He is moving all extremities equally.  C-spine collar in place.  Soft restraints are being placed bilateral ankles and wrists.  CT head and cervical spine, chest and pelvic x-rays have been ordered by the emergency physician.  Appreciate assistance from the emergency team.  We will follow-up on imaging, continue soft restraints and fall precautions for now.  Zada Finders, MD [facility]

## 2022-11-11 NOTE — Plan of Care (Signed)

## 2022-11-11 NOTE — ED Notes (Signed)
ED TO INPATIENT HANDOFF REPORT  ED Nurse Name and Phone #: [phone] Blake [name]  S Name/Age/Gender Blake [name] 82 y.o. male Room/Bed: TRAAC/TRAAC  Code Status   Code Status: Full Code  Home/SNF/Other Home Patient oriented to: self Is this baseline? Yes   Triage Complete: Triage complete  Chief Complaint Seizure [facility]) [R56.9]  Triage Note Patient arrives via ems from home secondary to stroke symptoms. Patient LKW reported by family is 2000 pm this evening. Patient reports lt side neglect, weakness and rt side gaze. Patient has hx of stroke.   LKW 1830 pm   Code stroke cancelled by neurology   Allergies No Known Allergies  Level of Care/Admitting Diagnosis ED Disposition     ED Disposition  Admit   Condition  --   [facility]: [place] [[id number]]  Level of Care: Telemetry Medical [104]  May place patient in observation at [facility] or [place] if equivalent level of care is available:: No  Covid Evaluation: Asymptomatic - no recent exposure (last 10 days) testing not required  Diagnosis: Seizure [facility]) [id number]  Admitting Physician: Blake [name] [id number]  Attending Physician: Blake [name] [id number]          B Medical/Surgery History Past Medical History:  Diagnosis Date   Chronic insomnia    Cigarette smoker    COPD (chronic obstructive pulmonary disease) ([place])    Diverticulosis of colon    Elevated prostate specific antigen (PSA)    ETOH abuse    GERD (gastroesophageal reflux disease)    Hx of colonic polyps    Hyperlipidemia    Hypertension    Pulmonary nodule    Seizures (HCC)    Shortness of breath    Stroke [facility])    Past Surgical History:  Procedure Laterality Date   COLONOSCOPY     ESOPHAGOGASTRODUODENOSCOPY (EGD) WITH PROPOFOL N/A 07/21/2018   Procedure: ESOPHAGOGASTRODUODENOSCOPY (EGD) WITH PROPOFOL;  Surgeon: Lavena Bullion, DO;  Location: [facility] ENDOSCOPY;  Service: Gastroenterology;   Laterality: N/A;   needle biopsy RUL nodule  01/2009   benign   TONSILLECTOMY  1947   VASECTOMY       A IV Location/Drains/Wounds Patient Lines/Drains/Airways Status     Active Line/Drains/Airways     Name Placement date Placement time Site Days   Peripheral IV 11/10/22 18 G Anterior;Distal;Right Forearm 11/10/22  2137  Forearm  1   Peripheral IV 11/10/22 18 G Anterior;Proximal;Right Forearm 11/10/22  2137  Forearm  1            Intake/Output Last 24 hours  Intake/Output Summary (Last 24 hours) at 11/11/2022 0026 Last data filed at 11/10/2022 2328 Gross per 24 hour  Intake 200 ml  Output --  Net 200 ml    Labs/Imaging Results for orders placed or performed during the hospital encounter of 11/10/22 (from the past 48 hour(s))  CBG monitoring, ED     Status: Abnormal   Collection Time: 11/10/22  9:25 PM  Result Value Ref Range   Glucose-Capillary 105 (H) 70 - 99 mg/dL    Comment: Glucose reference range applies only to samples taken after fasting for at least 8 hours.  Ethanol     Status: None   Collection Time: 11/10/22  9:27 PM  Result Value Ref Range   Alcohol, Ethyl (B) <10 <10 mg/dL    Comment: (NOTE) Lowest detectable limit for serum alcohol is 10 mg/dL.  For medical purposes only. Performed at [facility],  [address], [place], [place] [address]   Protime-INR     Status: None   Collection Time: 11/10/22  9:27 PM  Result Value Ref Range   Prothrombin Time 13.0 11.4 - 15.2 seconds   INR 1.0 0.8 - 1.2    Comment: (NOTE) INR goal varies based on device and disease states. Performed at [facility], [place] [address], [place], [place] [address]   APTT     Status: None   Collection Time: 11/10/22  9:27 PM  Result Value Ref Range   aPTT 27 24 - 36 seconds    Comment: Performed at [facility] [address] [place], [place] [address]  CBC     Status: Abnormal   Collection Time: 11/10/22  9:27 PM  Result Value Ref Range   WBC 10.9 (H)  4.0 - 10.5 K/uL   RBC 4.22 4.22 - 5.81 MIL/uL   Hemoglobin 14.1 13.0 - 17.0 g/dL   HCT 39.8 39.0 - 52.0 %   MCV 94.3 80.0 - 100.0 fL   MCH 33.4 26.0 - 34.0 pg   MCHC 35.4 30.0 - 36.0 g/dL   RDW 13.0 11.5 - 15.5 %   Platelets 211 150 - 400 K/uL   nRBC 0.0 0.0 - 0.2 %    Comment: Performed at [facility], [place] [address] [place], [place] [address]  Differential     Status: Abnormal   Collection Time: 11/10/22  9:27 PM  Result Value Ref Range   Neutrophils Relative % 80 %   Neutro Abs 8.8 (H) 1.7 - 7.7 K/uL   Lymphocytes Relative 12 %   Lymphs Abs 1.3 0.7 - 4.0 K/uL   Monocytes Relative 6 %   Monocytes Absolute 0.6 0.1 - 1.0 K/uL   Eosinophils Relative 0 %   Eosinophils Absolute 0.0 0.0 - 0.5 K/uL   Basophils Relative 1 %   Basophils Absolute 0.1 0.0 - 0.1 K/uL   Immature Granulocytes 1 %   Abs Immature Granulocytes 0.06 0.00 - 0.07 K/uL    Comment: Performed at [facility] [address] [place], [place] [address]  Comprehensive metabolic panel     Status: Abnormal   Collection Time: 11/10/22  9:27 PM  Result Value Ref Range   Sodium 135 135 - 145 mmol/L   Potassium 4.0 3.5 - 5.1 mmol/L   Chloride 100 98 - 111 mmol/L   CO2 26 22 - 32 mmol/L   Glucose, Bld 106 (H) 70 - 99 mg/dL    Comment: Glucose reference range applies only to samples taken after fasting for at least 8 hours.   BUN 23 8 - 23 mg/dL   Creatinine, Ser 1.72 (H) 0.61 - 1.24 mg/dL   Calcium 9.1 8.9 - 10.3 mg/dL   Total Protein 6.6 6.5 - 8.1 g/dL   Albumin 3.7 3.5 - 5.0 g/dL   AST 22 15 - 41 U/L   ALT 10 0 - 44 U/L   Alkaline Phosphatase 92 38 - 126 U/L   Total Bilirubin 0.4 0.3 - 1.2 mg/dL   GFR, Estimated 39 (L) >60 mL/min    Comment: (NOTE) Calculated using the CKD-EPI Creatinine Equation (2021)    Anion gap 9 5 - 15    Comment: Performed at [facility] [address] [place],  [address]  I-stat chem 8, ED     Status: Abnormal   Collection Time: 11/10/22  9:31 PM  Result  Value  Ref Range   Sodium 136 135 - 145 mmol/L   Potassium 4.1 3.5 - 5.1 mmol/L   Chloride 99 98 - 111 mmol/L   BUN 26 (H) 8 - 23 mg/dL   Creatinine, Ser 1.70 (H) 0.61 - 1.24 mg/dL   Glucose, Bld 104 (H) 70 - 99 mg/dL    Comment: Glucose reference range applies only to samples taken after fasting for at least 8 hours.   Calcium, Ion 1.06 (L) 1.15 - 1.40 mmol/L   TCO2 27 22 - 32 mmol/L   Hemoglobin 13.9 13.0 - 17.0 g/dL   HCT 41.0 39.0 - 52.0 %   DG Chest Portable 1 View  Result Date: 11/11/2022 CLINICAL DATA:  Status post fall. EXAM: PORTABLE CHEST 1 VIEW COMPARISON:  September 14, 2021 FINDINGS: The heart size and mediastinal contours are within normal limits. Both lungs are clear. Chronic third, fourth, fifth and sixth left rib fractures are seen. IMPRESSION: No active cardiopulmonary disease. Electronically Signed   By: Virgina Norfolk M.D.   On: 11/11/2022 00:13   DG Pelvis Portable  Result Date: 11/11/2022 CLINICAL DATA:  Status post fall. EXAM: PORTABLE PELVIS 1-2 VIEWS COMPARISON:  January 10, 2019 FINDINGS: There is no evidence of pelvic fracture or diastasis. No pelvic bone lesions are seen. Moderate to marked severity degenerative changes are seen involving both hips, in the form of joint space narrowing and acetabular sclerosis. Radiopaque contrast is seen throughout the lumen of the urinary bladder. IMPRESSION: Degenerative changes without evidence of acute osseous abnormality. Electronically Signed   By: Virgina Norfolk M.D.   On: 11/11/2022 00:12   MR BRAIN WO CONTRAST  Result Date: 11/10/2022 CLINICAL DATA:  Stroke suspected EXAM: MRI HEAD WITHOUT CONTRAST TECHNIQUE: Multiplanar, multiecho pulse sequences of the brain and surrounding structures were obtained without intravenous contrast. COMPARISON:  09/14/2021 MRI head FINDINGS: Evaluation is somewhat limited as the sagittal T1 could not be obtained due to patient motion. Brain: No restricted diffusion to suggest acute or  subacute infarct. No acute hemorrhage, mass, mass effect, or midline shift. No hydrocephalus or extra-axial collection. Advanced cerebral volume loss for age, with ex vacuo dilatation of the ventricles. Confluent and scattered T2 hyperintense signal in the periventricular white matter, likely the sequela of chronic small vessel ischemic disease. Vascular: Normal arterial flow voids. Skull and upper cervical spine: Normal marrow signal. Sinuses/Orbits: Clear paranasal sinuses. No acute finding in the orbits. Status post bilateral lens replacements. Other: The mastoid air cells are well aerated. IMPRESSION: No acute intracranial process. No evidence of acute or subacute infarct. Electronically Signed   By: Merilyn Baba M.D.   On: 11/10/2022 22:24   CT ANGIO HEAD NECK W WO CM (CODE STROKE)  Result Date: 11/10/2022 CLINICAL DATA:  Left-sided neglect, weakness, right gaze deviation EXAM: CT ANGIOGRAPHY HEAD AND NECK TECHNIQUE: Multidetector CT imaging of the head and neck was performed using the standard protocol during bolus administration of intravenous contrast. Multiplanar CT image reconstructions and MIPs were obtained to evaluate the vascular anatomy. Carotid stenosis measurements (when applicable) are obtained utilizing NASCET criteria, using the distal internal carotid diameter as the denominator. RADIATION DOSE REDUCTION: This exam was performed according to the departmental dose-optimization program which includes automated exposure control, adjustment of the mA and/or kV according to patient size and/or use of iterative reconstruction technique. CONTRAST:  68m OMNIPAQUE IOHEXOL 350 MG/ML SOLN COMPARISON:  09/14/2021 CTA head neck, correlation is also made with CT head 11/10/2021 FINDINGS: CT HEAD FINDINGS For noncontrast findings,  please see same day CT head. CTA NECK FINDINGS Aortic arch: Two-vessel arch with a common origin of the brachiocephalic and left common carotid arteries. Imaged portion shows  no evidence of aneurysm or dissection. No significant stenosis of the major arch vessel origins. Aortic atherosclerosis Right carotid system: No evidence of dissection, occlusion, or hemodynamically significant stenosis (greater than 50%). Atherosclerotic disease at the bifurcation and in the proximal ICA is not hemodynamically significant. Left carotid system: No evidence of dissection, occlusion, or hemodynamically significant stenosis (greater than 50%). Atherosclerotic disease at the bifurcation and in the proximal ICA is not hemodynamically significant. Vertebral arteries: Right dominant system. No evidence of dissection, occlusion, or hemodynamically significant stenosis (greater than 50%). Skeleton: No acute osseous abnormality. Degenerative changes in the cervical spine edentulous. Other neck: Hypoenhancing nodules in the thyroid, the largest of which measures up to 1.2 cm, for which no follow-up is currently indicated. (Reference: J Am Coll Radiol. 2015 Feb;12(2): 143-50) redemonstrated neck lipoma. Upper chest: Emphysema.  No pleural effusion. Review of the MIP images confirms the above findings CTA HEAD FINDINGS Anterior circulation: Both internal carotid arteries are patent to the termini, with mild stenosis in the bilateral cavernous and supraclinoid ICAs. A1 segments patent. Normal anterior communicating artery. Severe stenosis in a left A3 branch (series 12, image 65). Anterior cerebral arteries are otherwise patent to their distal aspects. No M1 stenosis or occlusion. Mild stenosis in a left M2 (series 12, images 99-100). MCA branches otherwise perfused and symmetric. Posterior circulation: Vertebral arteries patent to the vertebrobasilar junction without stenosis. Posterior inferior cerebellar arteries patent proximally. Basilar patent to its distal aspect. Suspect a small fenestration in the mid to distal basilar artery (series 13, image 118 superior cerebellar arteries patent proximally. Patent P1  segments, hypoplastic on the right. Near fetal origin of the right PCA, with moderate stenosis at the right posterior communicating artery origin (series 12, image 104). Multifocal moderate narrowing in the bilateral P2 segments, with additional focal severe narrowing in the left P2 (series 13, image 126). Venous sinuses: As permitted by contrast timing, patent. Anatomic variants: Near fetal origin of the right PCA. Review of the MIP images confirms the above findings IMPRESSION: 1. No intracranial large vessel occlusion. Severe stenosis in a left A3 branch. Multifocal moderate narrowing in the bilateral P2 segments, with additional focal severe narrowing in the left P2. 2. Mild stenosis in the bilateral cavernous and supraclinoid ICAs. 3. No hemodynamically significant stenosis in the neck. 4. Aortic atherosclerosis. Aortic Atherosclerosis (ICD10-I70.0). Electronically Signed   By: Merilyn Baba M.D.   On: 11/10/2022 22:09   CT HEAD CODE STROKE WO CONTRAST  Result Date: 11/10/2022 CLINICAL DATA:  Code stroke. EXAM: CT HEAD WITHOUT CONTRAST TECHNIQUE: Contiguous axial images were obtained from the base of the skull through the vertex without intravenous contrast. RADIATION DOSE REDUCTION: This exam was performed according to the departmental dose-optimization program which includes automated exposure control, adjustment of the mA and/or kV according to patient size and/or use of iterative reconstruction technique. COMPARISON:  09/14/2021 FINDINGS: Brain: No evidence of acute infarction, hemorrhage, mass, mass effect, or midline shift. No hydrocephalus or extra-axial collection. Mildly advanced cerebral atrophy for age. Periventricular white matter changes, likely the sequela of chronic small vessel ischemic disease. Vascular: No hyperdense vessel. Skull: Negative for fracture or focal lesion. Sinuses/Orbits: No acute finding. Status post bilateral lens replacements. Other: The mastoid air cells are well aerated.  ASPECTS [facility] Stroke Program Early CT Score) - Ganglionic level infarction (caudate,  lentiform nuclei, internal capsule, insula, M1-M3 cortex): 7 - Supraganglionic infarction (M4-M6 cortex): 3 Total score (0-10 with 10 being normal): 10 IMPRESSION: 1. No acute intracranial process. 2. ASPECTS is 10. Imaging results were communicated on 11/10/2022 at 9:37 pm to provider Dr. Lorrin Goodell via secure text paging. Electronically Signed   By: Merilyn Baba M.D.   On: 11/10/2022 21:38    Pending Labs Unresulted Labs (From admission, onward)     Start     Ordered   11/11/22 0500  CBC  Tomorrow morning,   R        11/10/22 2319   11/11/22 [id number]  Basic metabolic panel  Tomorrow morning,   R        11/10/22 2319   11/10/22 2125  Urinalysis, Routine w reflex microscopic -Urine, Clean Catch  ONCE - URGENT,   URGENT       Question:  Specimen Source  Answer:  Urine, Clean Catch   11/10/22 2125   11/10/22 2124  Urine rapid drug screen (hosp performed)  Once,   STAT        11/10/22 2125            Vitals/Pain Today's Vitals   11/10/22 2245 11/10/22 2315 11/10/22 2330 11/10/22 2347  BP: 124/75 119/67 (!) 117/57 (!) 117/57  Pulse: 90 81 77 77  Resp: 18   18  Temp:    99.1 F (37.3 C)  TempSrc:    Oral  SpO2: 93% 95% 96% 96%  Weight:      Height:      PainSc:    0-No pain    Isolation Precautions No active isolations  Medications Medications  levETIRAcetam (KEPPRA) IVPB 1000 mg/100 mL premix (0 mg Intravenous Stopped 11/10/22 2328)    Or  levETIRAcetam (KEPPRA) tablet 1,000 mg ( Oral See Alternative 11/10/22 2328)  0.9 %  sodium chloride infusion (75 mL/hr Intravenous New Bag/Given 11/10/22 2332)  heparin injection 5,000 Units (5,000 Units Subcutaneous Given 11/10/22 2333)  acetaminophen (TYLENOL) tablet 650 mg (has no administration in time range)    Or  acetaminophen (TYLENOL) suppository 650 mg (has no administration in time range)  senna-docusate (Senokot-S) tablet 1 tablet (has no  administration in time range)  ondansetron (ZOFRAN) tablet 4 mg (has no administration in time range)    Or  ondansetron (ZOFRAN) injection 4 mg (has no administration in time range)  albuterol (PROVENTIL) (2.5 MG/3ML) 0.083% nebulizer solution 2.5 mg (has no administration in time range)  iohexol (OMNIPAQUE) 350 MG/ML injection 60 mL (60 mLs Intravenous Contrast Given 11/10/22 2147)  LORazepam (ATIVAN) injection 2 mg (2 mg Intravenous Given 11/10/22 2154)  levETIRAcetam (KEPPRA) IVPB 1000 mg/100 mL premix (0 mg Intravenous Stopped 11/10/22 2308)    Mobility non-ambulatory     Focused Assessments Neuro Assessment Handoff:  Swallow screen pass? No  Cardiac Rhythm: Normal sinus rhythm NIH Stroke Scale  Dizziness Present: No Headache Present: No Interval: Other (Comment) (fall) Level of Consciousness (1a.)   : Alert, keenly responsive LOC Questions (1b. )   : Answers both questions correctly LOC Commands (1c. )   : Performs both tasks correctly Best Gaze (2. )  : Normal Visual (3. )  : No visual loss Facial Palsy (4. )    : Normal symmetrical movements Motor Arm, Left (5a. )   : No drift Motor Arm, Right (5b. ) : No drift Motor Leg, Left (6a. )  : No drift Motor Leg, Right (6b. ) : No drift  Limb Ataxia (7. ): Absent Sensory (8. )  : Normal, no sensory loss Best Language (9. )  : No aphasia Dysarthria (10. ): Mild-to-moderate dysarthria, patient slurs at least some words and, at worst, can be understood with some difficulty Extinction/Inattention (11.)   : Profound hemi-inattention or extinction to more than one modality Complete NIHSS TOTAL: 3 Last date known well: 11/10/22 Last time known well: 1800 Neuro Assessment:   Neuro Checks:   Initial (11/10/22 2124)  Has TPA been given? No If patient is a Neuro Trauma and patient is going to OR before floor call report to [place] nurse: [phone] or [phone]   R Recommendations: See Admitting Provider Note  Report given  to:   Additional Notes:

## 2022-11-12 ENCOUNTER — Other Ambulatory Visit (HOSPITAL_COMMUNITY): Payer: Self-pay

## 2022-11-12 DIAGNOSIS — R569 Unspecified convulsions: Secondary | ICD-10-CM | POA: Diagnosis not present

## 2022-11-12 MED ORDER — NICOTINE 14 MG/24HR TD PT24
14.0000 mg | MEDICATED_PATCH | Freq: Every day | TRANSDERMAL | Status: DC
  Administered 2022-11-12: 14 mg via TRANSDERMAL
  Filled 2022-11-12: qty 1

## 2022-11-12 MED ORDER — LEVETIRACETAM 1000 MG PO TABS
1000.0000 mg | ORAL_TABLET | Freq: Two times a day (BID) | ORAL | 2 refills | Status: DC
  Filled 2022-11-12: qty 60, 30d supply, fill #0

## 2022-11-12 MED ORDER — APIXABAN 2.5 MG PO TABS
2.5000 mg | ORAL_TABLET | Freq: Two times a day (BID) | ORAL | 2 refills | Status: DC
  Filled 2022-11-12: qty 60, 30d supply, fill #0

## 2022-11-12 MED ORDER — ACETAMINOPHEN 325 MG PO TABS: 650.0000 mg | ORAL_TABLET | Freq: Four times a day (QID) | ORAL | Status: DC | PRN

## 2022-11-12 MED ORDER — ATORVASTATIN CALCIUM 20 MG PO TABS: 20.0000 mg | ORAL_TABLET | Freq: Every day | ORAL | 2 refills | Status: DC

## 2022-11-12 MED ORDER — THIAMINE MONONITRATE 100 MG PO TABS
100.0000 mg | ORAL_TABLET | Freq: Every day | ORAL | Status: DC
  Administered 2022-11-12: 100 mg via ORAL
  Filled 2022-11-12: qty 1

## 2022-11-12 NOTE — Evaluation (Signed)
Occupational Therapy Evaluation Patient Details Name: Blake [name] MRN: [id number] DOB: 20-Apr-1941 Today's Date: 11/12/2022   History of Present Illness 82 year old with a history of chronic atrial fibrillation not on anticoagulation, CVA, seizure disorder, CKD stage IIIb, HTN, HLD, and tobacco and alcohol found down at home and admitted with concern for stroke, but MRI negative, treating for seizure with possible Todd's paralysis.   Clinical Impression   Pt admitted with the above diagnosis and has the deficits outlined below. Pt would benefit from cont OT to increase independence with basic adls and adl transfers so pt can avoid falls at home with caregivers.  Feel pt will be a fall risk at home and will need close supervision to avoid falls due to impulsivity and other cognitive impairments that were most likely present PTA.  Will continue to see acutely.      Recommendations for follow up therapy are one component of a multi-disciplinary discharge planning process, led by the attending physician.  Recommendations may be updated based on patient status, additional functional criteria and insurance authorization.   Follow Up Recommendations  Home health OT     Assistance Recommended at Discharge Frequent or constant Supervision/Assistance  Patient can return home with the following A little help with walking and/or transfers;A little help with bathing/dressing/bathroom;Assistance with cooking/housework;Direct supervision/assist for medications management;Direct supervision/assist for financial management;Assist for transportation;Help with stairs or ramp for entrance    Functional Status Assessment  Patient has had a recent decline in their functional status and demonstrates the ability to make significant improvements in function in a reasonable and predictable amount of time.  Equipment Recommendations  None recommended by OT    Recommendations for Other Services       Precautions  / Restrictions Precautions Precautions: Fall Restrictions Weight Bearing Restrictions: No      Mobility Bed Mobility Overal bed mobility: Needs Assistance Bed Mobility: Supine to Sit, Sit to Supine     Supine to sit: Supervision Sit to supine: Supervision   General bed mobility comments: assist for safety and impulsivity    Transfers Overall transfer level: Needs assistance Equipment used: None Transfers: Sit to/from Stand Sit to Stand: Min guard           General transfer comment: Pt moves quickly and is impulsive      Balance Overall balance assessment: Needs assistance Sitting-balance support: Feet supported Sitting balance-Leahy Scale: Good     Standing balance support: Single extremity supported Standing balance-Leahy Scale: Fair Standing balance comment: Pt was able to stand at bedside holding to rail without assist while therapist made bed due to wet sheets.  (condom cath leaking)                           ADL either performed or assessed with clinical judgement   ADL Overall ADL's : Needs assistance/impaired Eating/Feeding: Independent;Sitting Eating/Feeding Details (indicate cue type and reason): Pt's bottom dentures are missing .Pt has no idea where they are. They are not in room. Nursing aware. Grooming: Wash/dry hands;Wash/dry face;Oral care;Min guard;Standing Grooming Details (indicate cue type and reason): Pt stood at sink for appx 5 min to groom. Upper Body Bathing: Set up;Sitting   Lower Body Bathing: Minimal assistance;Sit to/from stand;Cueing for compensatory techniques Lower Body Bathing Details (indicate cue type and reason): pt unsteady coming sit to stand. Upper Body Dressing : Set up;Sitting   Lower Body Dressing: Minimal assistance;Sit to/from stand;Cueing for compensatory techniques Lower Body Dressing Details (  indicate cue type and reason): most assist needed coming sit to stand and getting steady on feet before managing  clothing. Toilet Transfer: Minimal assistance;Ambulation;Comfort height toilet;Grab bars   Toileting- Clothing Manipulation and Hygiene: Minimal assistance;Sit to/from stand;Cueing for compensatory techniques Toileting - Clothing Manipulation Details (indicate cue type and reason): Pt moves quickly and becomes unsteady at times. Feel pt is a fall risk. Pt appears to have taken falls due to bruises on kneeds and eye.     Functional mobility during ADLs: Minimal assistance General ADL Comments: Pt is most likely close to baseline with adls and mobilty.  Feel pt was probably a fall risk PTA and remains one now. Pt is impulsive with little insight into his deficits but stating he thinks he is safe to drive. Pt should not be driving or be alone at home for any amount of time due to being a fall risk.     Vision Baseline Vision/History: 0 No visual deficits Ability to See in Adequate Light: 0 Adequate Patient Visual Report: No change from baseline;Other (comment) (Pt with swollen L eye from recent fall.) Vision Assessment?: Yes Eye Alignment: Within Functional Limits Ocular Range of Motion: Within Functional Limits Alignment/Gaze Preference: Within Defined Limits Tracking/Visual Pursuits: Able to track stimulus in all quads without difficulty Saccades: Within functional limits Convergence: Within functional limits Visual Fields: No apparent deficits     Agricultural engineer Tested?: No   Praxis Praxis Praxis tested?: Within functional limits    Pertinent Vitals/Pain Pain Assessment Pain Assessment: No/denies pain     Hand Dominance Right   Extremity/Trunk Assessment Upper Extremity Assessment Upper Extremity Assessment: Overall WFL for tasks assessed   Lower Extremity Assessment Lower Extremity Assessment: Defer to PT evaluation   Cervical / Trunk Assessment Cervical / Trunk Assessment: Kyphotic   Communication Communication Communication: HOH   Cognition  Arousal/Alertness: Awake/alert Behavior During Therapy: WFL for tasks assessed/performed Overall Cognitive Status: No family/caregiver present to determine baseline cognitive functioning                                 General Comments: Pt very limited with cognition. Pt with STM and LTM deficits. Pt could not recall how his home was set up and just asked me to ask his caregivers. He does not recall how he knows his caregivers and does not know if they live with him or if he lives with them.  Pt did know to look at wall to find out the date and did recall his address but not phone number.     General Comments  Pt most limited by impulsive personality, cognitive deficits (most likely there premorbidly) and impaired balance when walking.    Exercises     Shoulder Instructions      Home Living Family/patient expects to be discharged to:: Private residence Living Arrangements: Other (Comment) (caregivers. Pt cannot recall  how he knows them) Available Help at Discharge: Personal care attendant Type of Home: Mobile home Home Access: Stairs to enter Entrance Stairs-Number of Steps: 2   Home Layout: One level     Bathroom Shower/Tub: Occupational psychologist: Standard         Additional Comments: Pt said he could not recall anything about set up of his home and to just ask his caregivers.      Prior Functioning/Environment Prior Level of Function : Needs assist  Mobility Comments: pt states he can walk around by himself.  States he doesnt think he falls ADLs Comments: Pt states he can bathe, dress, toilet for himself.  Caregivers cook for him and clean and drive. Pt states he doesnt drive.."but he could."        OT Problem List: Impaired balance (sitting and/or standing);Decreased cognition;Decreased safety awareness;Decreased knowledge of use of DME or AE      OT Treatment/Interventions: Self-care/ADL training;Therapeutic activities;DME  and/or AE instruction;Balance training;Cognitive remediation/compensation    OT Goals(Current goals can be found in the care plan section) Acute Rehab OT Goals Patient Stated Goal: he is unsure OT Goal Formulation: With patient Time For Goal Achievement: 11/26/22 Potential to Achieve Goals: Fair ADL Goals Pt Will Perform Lower Body Bathing: with supervision;sit to/from stand Pt Will Perform Lower Body Dressing: with supervision;sit to/from stand Pt Will Perform Tub/Shower Transfer: with supervision;Tub transfer;ambulating Additional ADL Goal #1: Pt will walk to bathroom and complete all toileting tasks with supervision.  OT Frequency: Min 2X/week    Co-evaluation              AM-PAC OT "6 Clicks" Daily Activity     Outcome Measure Help from another person eating meals?: None Help from another person taking care of personal grooming?: A Little Help from another person toileting, which includes using toliet, bedpan, or urinal?: A Little Help from another person bathing (including washing, rinsing, drying)?: A Little Help from another person to put on and taking off regular upper body clothing?: None Help from another person to put on and taking off regular lower body clothing?: A Little 6 Click Score: 20   End of Session Nurse Communication: Mobility status  Activity Tolerance: Patient tolerated treatment well Patient left: in bed;with call bell/phone within reach;with bed alarm set  OT Visit Diagnosis: Unsteadiness on feet (R26.81)                Time: [id number] OT Time Calculation (min): 28 min Charges:  OT General Charges $OT Visit: 1 Visit OT Evaluation $OT Eval Moderate Complexity: 1 Mod OT Treatments $Self Care/Home Management : 8-22 mins  Glenford Peers 11/12/2022, 10:15 AM

## 2022-11-12 NOTE — Plan of Care (Signed)
VeRN reviewed AVS with pt and with POA( over the phone). They stated they understood the instructions and had no questions at this time. Problem: Safety: Goal: Non-violent Restraint(s) Outcome: Adequate for Discharge   Problem: Education: Goal: Knowledge of General Education information will improve Description: Including pain rating scale, medication(s)/side effects and non-pharmacologic comfort measures Outcome: Adequate for Discharge   Problem: Health Behavior/Discharge Planning: Goal: Ability to manage health-related needs will improve Outcome: Adequate for Discharge   Problem: Clinical Measurements: Goal: Ability to maintain clinical measurements within normal limits will improve Outcome: Adequate for Discharge Goal: Will remain free from infection Outcome: Adequate for Discharge Goal: Diagnostic test results will improve Outcome: Adequate for Discharge Goal: Respiratory complications will improve Outcome: Adequate for Discharge Goal: Cardiovascular complication will be avoided Outcome: Adequate for Discharge   Problem: Activity: Goal: Risk for activity intolerance will decrease Outcome: Adequate for Discharge   Problem: Nutrition: Goal: Adequate nutrition will be maintained Outcome: Adequate for Discharge   Problem: Coping: Goal: Level of anxiety will decrease Outcome: Adequate for Discharge   Problem: Elimination: Goal: Will not experience complications related to bowel motility Outcome: Adequate for Discharge Goal: Will not experience complications related to urinary retention Outcome: Adequate for Discharge   Problem: Pain Managment: Goal: General experience of comfort will improve Outcome: Adequate for Discharge   Problem: Safety: Goal: Ability to remain free from injury will improve Outcome: Adequate for Discharge   Problem: Skin Integrity: Goal: Risk for impaired skin integrity will decrease Outcome: Adequate for Discharge   Problem: Acute Rehab PT  Goals(only PT should resolve) Goal: Pt Will Go Supine/Side To Sit Outcome: Adequate for Discharge Goal: Patient Will Transfer Sit To/From Stand Outcome: Adequate for Discharge Goal: Pt Will Perform Standing Balance Or Pre-Gait Outcome: Adequate for Discharge Goal: Pt Will Ambulate Outcome: Adequate for Discharge Goal: Pt Will Go Up/Down Stairs Outcome: Adequate for Discharge   Problem: Acute Rehab OT Goals (only OT should resolve) Goal: Pt. Will Perform Lower Body Bathing Outcome: Adequate for Discharge Goal: Pt. Will Perform Lower Body Dressing Outcome: Adequate for Discharge Goal: Pt. Will Perform Tub/Shower Transfer Outcome: Adequate for Discharge Goal: OT Additional ADL Goal #1 Outcome: Adequate for Discharge

## 2022-11-12 NOTE — Discharge Summary (Signed)
DISCHARGE SUMMARY  Blake [name]  MR#: [id number]  DOB:January 08, 1941  Date of Admission: 11/10/2022 Date of Discharge: 11/12/2022  Attending Physician:Tamari Redwine Hennie Duos, MD  Patient's [id number], No Pcp Per  Consults: Neurology   Disposition: D/C Home   Follow-up Appts:  Follow-up Information     [facility] Follow up.   Why: [address] . If you havent heard from anyone to arrange an appointment  within 3 business days please call  [phone]                Discharge Diagnoses: Acute breakthrough seizure with history of seizure disorder Unwitnessed fall Acute delirium  Chronic atrial fibrillation CKD stage IIIb History of CVA COPD HTN  Initial presentation: 82 year old with a history of chronic atrial fibrillation not on anticoagulation, CVA, seizure disorder, CKD stage IIIb, HTN, HLD, and tobacco and alcohol use who was found down on the floor by a friend who lives with him.  He reported left-sided weakness and would only gaze to the right.  The patient was brought in by EMS as a code stroke but MRI brain was negative for CVA.  Neurology felt this likely represented a complex seizure with a postictal Todd's paralysis.  CT head was also negative for any acute intracranial process.   Hospital Course:  Acute breakthrough seizure with history of seizure disorder presented with left-sided weakness/neglect and right gaze preference - CT and MRI head without evidence of acute findings - patient loaded with 2 g of Keppra and baseline Keppra dose increased to 1000 mg twice daily - no further workup required per Neurology - patient to follow-up with his outpatient Neurologist after discharge   Unwitnessed fall The patient was found facedown on the floor in his ER room with swelling to his left forehead and around the left eye - CT head and C-spine was without acute findings - SCDs for DVT prophy admin during admit    Acute delirium   Likely combination of postictal confusion as well as possible concussion from recent fall - steadily improving at time of d/c - has help / a supportive environment at home    Chronic atrial fibrillation was not on Eliquis at admission which it appears the patient had stopped on his own - Eliquis to resume at d/c after discussion with patient - avoiding resumption immediately due to fall with blunt force trauma to face   CKD stage IIIb Gently hydrated - creatinine has improved from 1.72 to 1.57 during hospital stay   History of CVA No evidence of acute CVA this admission   COPD Well compensated presently   HTN Blood pressure well-controlled   Allergies as of 11/12/2022   No Known Allergies      Medication List     STOP taking these medications    furosemide 40 MG tablet Commonly known as: LASIX   lisinopril 5 MG tablet Commonly known as: ZESTRIL       TAKE these medications    acetaminophen 325 MG tablet Commonly known as: TYLENOL Take 2 tablets (650 mg total) by mouth every 6 (six) hours as needed for mild pain, fever or headache.   apixaban 5 MG Tabs tablet Commonly known as: ELIQUIS Take 1 tablet (5 mg total) by mouth 2 (two) times daily. Start taking on: November 19, 2022 What changed: These instructions start on November 19, 2022. If you are unsure what to do until then, ask your doctor or other care provider.   atorvastatin  20 MG tablet Commonly known as: LIPITOR Take 1 tablet (20 mg total) by mouth daily.   levETIRAcetam 1000 MG tablet Commonly known as: KEPPRA Take 1 tablet (1,000 mg total) by mouth every 12 (twelve) hours. What changed:  medication strength how much to take when to take this   thiamine 100 MG tablet Commonly known as: VITAMIN B1 Take 1 tablet (100 mg total) by mouth daily.        Day of Discharge BP (!) 118/54 (BP Location: Right Arm)   Pulse 65   Temp 98.1 F (36.7 C)   Resp 16   Ht 5' 11"$  (1.803 m)   Wt 68.6 kg    SpO2 93%   BMI 21.09 kg/m   Physical Exam: General: No acute respiratory distress Lungs: Clear to auscultation bilaterally without wheezes or crackles Cardiovascular: Regular rate and rhythm without murmur gallop or rub normal S1 and S2 Abdomen: Nontender, nondistended, soft, bowel sounds positive, no rebound, no ascites, no appreciable mass Extremities: No significant cyanosis, clubbing, or edema bilateral lower extremities  Basic Metabolic Panel: Recent Labs  Lab 11/10/22 2127 11/10/22 2131 11/11/22 0619  NA 135 136 134*  K 4.0 4.1 4.3  CL 100 99 98  CO2 26  --  24  GLUCOSE 106* 104* 90  BUN 23 26* 21  CREATININE 1.72* 1.70* 1.57*  CALCIUM 9.1  --  8.6*   CBC: Recent Labs  Lab 11/10/22 2127 11/10/22 2131 11/11/22 0619  WBC 10.9*  --  11.2*  NEUTROABS 8.8*  --   --   HGB 14.1 13.9 13.4  HCT 39.8 41.0 38.0*  MCV 94.3  --  93.6  PLT 211  --  209    Time spent in discharge (includes decision making & examination of pt): 35 minutes  11/12/2022, 3:12 PM   Cherene Altes, MD [facility] Office  [phone]

## 2022-11-12 NOTE — TOC Initial Note (Signed)
Transition of Care [facility]) - Initial/Assessment Note    Patient Details  Name: Blake [name] MRN: [id number] Date of Birth: 09/09/1941  [facility] CM/SW Contact:    Ninfa Meeker, RN Phone Number: 11/12/2022, 2:10 PM  Clinical Narrative:                 Case manager has sent referral to [facility], They will contact to arrange appointment. CM included note stating they need to contact Blake [id number] [phone] to arrange appointments. [name] states that he will be taking patient for therapy.   Expected Discharge Plan: [place] Barriers to Discharge: Continued Medical Work up   Patient Goals and CMS Choice            Expected Discharge Plan and Services   Discharge Planning Services: CM Consult                                          Prior Living Arrangements/Services     Patient language and need for interpreter reviewed:: Yes        Need for Family Participation in Patient Care: Yes (Comment) Care giver support system in place?: Yes (comment)   Criminal Activity/Legal Involvement Pertinent to Current Situation/Hospitalization: No - Comment as needed  Activities of Daily Living   ADL Screening (condition at time of admission) Patient's cognitive ability adequate to safely complete daily activities?: Yes Is the patient deaf or have difficulty hearing?: Yes Does the patient have difficulty seeing, even when wearing glasses/contacts?: Yes Does the patient have difficulty concentrating, remembering, or making decisions?: Yes Patient able to express need for assistance with ADLs?: Yes Does the patient have difficulty dressing or bathing?: Yes Independently performs ADLs?: No Communication: Independent Dressing (OT): Needs assistance Is this a change from baseline?: Change from baseline, expected to last >3 days Grooming: Needs assistance Is this a change from baseline?: Change from baseline,  expected to last >3 days Feeding: Independent Bathing: Needs assistance Is this a change from baseline?: Pre-admission baseline Toileting: Needs assistance Is this a change from baseline?: Pre-admission baseline In/Out Bed: Needs assistance Is this a change from baseline?: Change from baseline, expected to last >3 days  Permission Sought/Granted                  Emotional Assessment Appearance:: Appears younger than stated age Attitude/Demeanor/Rapport: Lethargic Affect (typically observed): Quiet Orientation: : Fluctuating Orientation (Suspected and/or reported Sundowners) Alcohol / Substance Use: Alcohol Use Psych Involvement: No (comment)  Admission diagnosis:  Seizure [facility]) [R56.9] Patient Active Problem List   Diagnosis Date Noted   Chronic kidney disease, stage 3b ([place]) 11/10/2022   Alcohol use 11/10/2022   History of CVA (cerebrovascular accident) 09/14/2021   Atrial fibrillation, chronic ([place]) 09/14/2021   Alcohol withdrawal ([place]) 04/22/2020   Seizure ([place]) 04/20/2020   Leukocytosis 03/13/2020   Stroke-like symptoms s/p tPA - ? TIA vs aborted stroke vs seizure 03/06/2020   Acute CVA (cerebrovascular accident) ([place]) 01/10/2019   Esophageal stricture    Abnormal finding on GI tract imaging    Dysphagia 07/20/2018   Malnutrition of moderate degree 07/10/2018   Aspiration pneumonia ([place]) 07/07/2018   Seizure disorder ([place]) 07/07/2018   HLD (hyperlipidemia) 07/07/2018   Acute hypoxemic respiratory failure ([place]) 07/07/2018   Encounter for intubation    Acute respiratory failure with hypercapnia ([place])  HCAP (healthcare-associated pneumonia)    Protein-calorie malnutrition, severe 06/26/2018   Alcohol withdrawal seizure ([place]) 06/25/2018   New onset seizure ([place]) 01/07/2018   Hypertension 01/07/2018   GERD (gastroesophageal reflux disease) 01/07/2018   Hyperlipidemia 01/07/2018   Cigarette smoker 01/07/2018   Alcohol abuse 01/07/2018   Acute alcohol abuse,  with delirium ([place])    Acute encephalopathy 10/14/2014   Unsteady gait 10/14/2014   Alcohol withdrawal delirium ([place]) 10/14/2014   Lipoma of neck 12/22/2012   Cerebral atrophy ([place]) 09/28/2012   Cerebrovascular small vessel disease 09/28/2012   Left humeral fracture 07/04/2012   Delirium 07/04/2012   ETOH abuse    ELEVATED PROSTATE SPECIFIC ANTIGEN 09/30/2010   COPD (chronic obstructive pulmonary disease) ([place]) 01/28/2009   PULMONARY NODULE 01/28/2009   Tobacco use 02/07/2008   INSOMNIA, CHRONIC 10/15/2007   COLONIC POLYPS 10/10/2007   Type 2 diabetes mellitus ([place]) 10/10/2007   Dyslipidemia 10/10/2007   Essential hypertension 10/10/2007   GERD 10/10/2007   DIVERTICULOSIS OF COLON 10/10/2007   PCP:  Patient, No Pcp Per Pharmacy:   [id number] [place], [place] - [address] [facility] [place] [address]Phone: [phone]: [phone]    Social Determinants of Health (SDOH) Social History: SDOH Screenings   Tobacco Use: High Risk (11/10/2022)   SDOH Interventions:     Readmission Risk Interventions     No data to display

## 2023-07-03 ENCOUNTER — Observation Stay (HOSPITAL_COMMUNITY)
Admission: EM | Admit: 2023-07-03 | Discharge: 2023-07-05 | Disposition: A | Discharge status: Home / Self Care | Payer: Medicare Other | Attending: Family Medicine | Admitting: Family Medicine

## 2023-07-03 ENCOUNTER — Other Ambulatory Visit: Payer: Self-pay

## 2023-07-03 ENCOUNTER — Emergency Department (HOSPITAL_COMMUNITY): Payer: Medicare Other

## 2023-07-03 DIAGNOSIS — I482 Chronic atrial fibrillation, unspecified: Secondary | ICD-10-CM | POA: Diagnosis not present

## 2023-07-03 DIAGNOSIS — E785 Hyperlipidemia, unspecified: Secondary | ICD-10-CM

## 2023-07-03 DIAGNOSIS — G9341 Metabolic encephalopathy: Secondary | ICD-10-CM | POA: Insufficient documentation

## 2023-07-03 DIAGNOSIS — D72829 Elevated white blood cell count, unspecified: Secondary | ICD-10-CM | POA: Diagnosis not present

## 2023-07-03 DIAGNOSIS — J449 Chronic obstructive pulmonary disease, unspecified: Secondary | ICD-10-CM | POA: Insufficient documentation

## 2023-07-03 DIAGNOSIS — R569 Unspecified convulsions: Principal | ICD-10-CM

## 2023-07-03 DIAGNOSIS — R29898 Other symptoms and signs involving the musculoskeletal system: Secondary | ICD-10-CM

## 2023-07-03 DIAGNOSIS — F17210 Nicotine dependence, cigarettes, uncomplicated: Secondary | ICD-10-CM | POA: Diagnosis not present

## 2023-07-03 DIAGNOSIS — R946 Abnormal results of thyroid function studies: Secondary | ICD-10-CM | POA: Diagnosis not present

## 2023-07-03 DIAGNOSIS — G8384 Todd's paralysis (postepileptic): Secondary | ICD-10-CM | POA: Diagnosis not present

## 2023-07-03 DIAGNOSIS — Z7901 Long term (current) use of anticoagulants: Secondary | ICD-10-CM | POA: Insufficient documentation

## 2023-07-03 DIAGNOSIS — Z79899 Other long term (current) drug therapy: Secondary | ICD-10-CM | POA: Insufficient documentation

## 2023-07-03 DIAGNOSIS — N1832 Chronic kidney disease, stage 3b: Secondary | ICD-10-CM

## 2023-07-03 DIAGNOSIS — R001 Bradycardia, unspecified: Secondary | ICD-10-CM

## 2023-07-03 DIAGNOSIS — I639 Cerebral infarction, unspecified: Secondary | ICD-10-CM | POA: Diagnosis not present

## 2023-07-03 DIAGNOSIS — R251 Tremor, unspecified: Secondary | ICD-10-CM | POA: Diagnosis present

## 2023-07-03 DIAGNOSIS — G40909 Epilepsy, unspecified, not intractable, without status epilepticus: Secondary | ICD-10-CM

## 2023-07-03 LAB — URINALYSIS, W/ REFLEX TO CULTURE (INFECTION SUSPECTED)
Bacteria, UA: NONE SEEN
Bilirubin Urine: NEGATIVE
Glucose, UA: NEGATIVE mg/dL
Hgb urine dipstick: NEGATIVE
Ketones, ur: NEGATIVE mg/dL
Leukocytes,Ua: NEGATIVE
Nitrite: NEGATIVE
Protein, ur: NEGATIVE mg/dL
Specific Gravity, Urine: 1.012 (ref 1.005–1.030)
pH: 5 (ref 5.0–8.0)

## 2023-07-03 LAB — CBC WITH DIFFERENTIAL/PLATELET
Abs Immature Granulocytes: 0.06 10*3/uL (ref 0.00–0.07)
Basophils Absolute: 0.1 10*3/uL (ref 0.0–0.1)
Basophils Relative: 1 %
Eosinophils Absolute: 0.1 10*3/uL (ref 0.0–0.5)
Eosinophils Relative: 0 %
HCT: 35.9 % — ABNORMAL LOW (ref 39.0–52.0)
Hemoglobin: 12.5 g/dL — ABNORMAL LOW (ref 13.0–17.0)
Immature Granulocytes: 0 %
Lymphocytes Relative: 9 %
Lymphs Abs: 1.2 10*3/uL (ref 0.7–4.0)
MCH: 32.6 pg (ref 26.0–34.0)
MCHC: 34.8 g/dL (ref 30.0–36.0)
MCV: 93.7 fL (ref 80.0–100.0)
Monocytes Absolute: 0.9 10*3/uL (ref 0.1–1.0)
Monocytes Relative: 7 %
Neutro Abs: 11.2 10*3/uL — ABNORMAL HIGH (ref 1.7–7.7)
Neutrophils Relative %: 83 %
Platelets: 259 10*3/uL (ref 150–400)
RBC: 3.83 MIL/uL — ABNORMAL LOW (ref 4.22–5.81)
RDW: 13.1 % (ref 11.5–15.5)
WBC: 13.5 10*3/uL — ABNORMAL HIGH (ref 4.0–10.5)
nRBC: 0 % (ref 0.0–0.2)

## 2023-07-03 LAB — CBG MONITORING, ED: Glucose-Capillary: 97 mg/dL (ref 70–99)

## 2023-07-03 LAB — COMPREHENSIVE METABOLIC PANEL
ALT: 10 U/L (ref 0–44)
AST: 14 U/L — ABNORMAL LOW (ref 15–41)
Albumin: 3.8 g/dL (ref 3.5–5.0)
Alkaline Phosphatase: 98 U/L (ref 38–126)
Anion gap: 10 (ref 5–15)
BUN: 16 mg/dL (ref 8–23)
CO2: 26 mmol/L (ref 22–32)
Calcium: 9 mg/dL (ref 8.9–10.3)
Chloride: 98 mmol/L (ref 98–111)
Creatinine, Ser: 1.58 mg/dL — ABNORMAL HIGH (ref 0.61–1.24)
GFR, Estimated: 43 mL/min — ABNORMAL LOW (ref >60)
Glucose, Bld: 107 mg/dL — ABNORMAL HIGH (ref 70–99)
Potassium: 4.9 mmol/L (ref 3.5–5.1)
Sodium: 134 mmol/L — ABNORMAL LOW (ref 135–145)
Total Bilirubin: 0.8 mg/dL (ref 0.3–1.2)
Total Protein: 6.5 g/dL (ref 6.5–8.1)

## 2023-07-03 LAB — PROCALCITONIN: Procalcitonin: <0.1 ng/mL

## 2023-07-03 LAB — MAGNESIUM: Magnesium: 1.9 mg/dL (ref 1.7–2.4)

## 2023-07-03 LAB — ETHANOL: Alcohol, Ethyl (B): <10 mg/dL (ref <10)

## 2023-07-03 MED ORDER — FOLIC ACID 1 MG PO TABS
1.0000 mg | ORAL_TABLET | Freq: Every day | ORAL | Status: DC
  Administered 2023-07-04 – 2023-07-05 (×2): 1 mg via ORAL
  Filled 2023-07-03 (×3): qty 1

## 2023-07-03 MED ORDER — ADULT MULTIVITAMIN W/MINERALS CH
1.0000 | ORAL_TABLET | Freq: Every day | ORAL | Status: DC
  Administered 2023-07-04 – 2023-07-05 (×2): 1 via ORAL
  Filled 2023-07-03 (×3): qty 1

## 2023-07-03 MED ORDER — ONDANSETRON HCL 4 MG/2ML IJ SOLN
4.0000 mg | Freq: Once | INTRAMUSCULAR | Status: AC
  Administered 2023-07-03: 4 mg via INTRAVENOUS
  Filled 2023-07-03: qty 2

## 2023-07-03 MED ORDER — LORAZEPAM 1 MG PO TABS: 1.0000 mg | ORAL_TABLET | ORAL | Status: DC | PRN

## 2023-07-03 MED ORDER — THIAMINE HCL 100 MG/ML IJ SOLN
100.0000 mg | Freq: Every day | INTRAMUSCULAR | Status: DC
  Filled 2023-07-03 (×2): qty 2

## 2023-07-03 MED ORDER — THIAMINE MONONITRATE 100 MG PO TABS
100.0000 mg | ORAL_TABLET | Freq: Every day | ORAL | Status: DC
  Administered 2023-07-04 – 2023-07-05 (×2): 100 mg via ORAL
  Filled 2023-07-03 (×3): qty 1

## 2023-07-03 MED ORDER — LORAZEPAM 2 MG/ML IJ SOLN
1.0000 mg | INTRAMUSCULAR | Status: DC | PRN
  Administered 2023-07-04 – 2023-07-05 (×3): 1 mg via INTRAVENOUS
  Filled 2023-07-03 (×3): qty 1

## 2023-07-03 NOTE — ED Notes (Signed)
Condom cath placed on patient

## 2023-07-03 NOTE — Assessment & Plan Note (Signed)
-  Not currently on anticoagulation -Rate controlled at admission in the 80's -Echo in the AM  -Continue to monitor

## 2023-07-03 NOTE — ED Notes (Signed)
PT's POA Yoav Okane is requesting any updates after MRI. Same's number is on file.

## 2023-07-03 NOTE — ED Notes (Addendum)
Dr. Theresia Lo asked paramedic to call POA and inform him labs were normal, discharged, and he needs to follow up with outpatient neurology.  POA stated he would be here in one hour.  Dr. Theresia Lo made aware via secure chat.

## 2023-07-03 NOTE — H&P (Signed)
History and Physical    Patient: Blake [name] AVW:[id number] DOB: 08-23-1941 DOA: 07/03/2023 DOS: the patient was seen and examined on 07/03/2023 PCP: Patient, No Pcp Per  Patient coming from: Home  Chief Complaint:  Chief Complaint  Patient presents with   Shaking   HPI: Blake [name] is a 82 y.o. male with medical history significant of chronic insomnia, tobacco use disorder less than a pack per day, COPD, atrial fibrillation not on anticoagulation, history of alcohol use reported last drink several months ago, GERD, hyperlipidemia, hypertension, seizure disorder, and more presents the ED with a chief complaint of shaking.  Patient has no recollection of the event.  It is reported that patient's wife was giving him medication around 5:15 in the morning when she found him shaking in his bed.  She reported it looked like a seizure, but he was responsive the whole time.  He stated that he "felt weird."  He was apparently unable to walk following this event.  There was no postictal state, no definite loss of consciousness.  ED provider reported that on his exam patient had left upper extremity weakness with pronator drift and difficulty with finger-nose-finger.  Patient does not seem to have pronator drift during my exam.  He does have left hand grip strength weakness compared to right, and he is still having trouble with finger-nose-finger.  Neurohospitalist was consulted by the ER and recommended admission for CVA workup.  It is of note the patient has a history of seizures but has not had any missed doses of Keppra.  It is reported that he has not drink alcohol since February, making withdrawal seizure very low on the differential.  Without loss of consciousness or postictal state, more likely that this was just tremors and not seizure activity.  Patient reports no complaints at this time.  He does not have any numbness or paresthesias at the time of my exam.  He is not in any pain.  He reports that he  feels like he is in his normal state of health.  Again, patient has no recollection of the events earlier today, so this history was obtained from chart review.  No family at bedside at the time of my exam.  Patient reports he smokes less than a pack per day.  He does not need a nicotine patch at this time per his report.  He has a history of alcohol use disorder, but it is reported that his last drink was in February.  Around the side of caution, and monitor  with CIWA.  Full code by default as POA is not at bedside at the time of my exam.   review of Systems: As mentioned in the history of present illness. All other systems reviewed and are negative. Past Medical History:  Diagnosis Date   Chronic insomnia    Cigarette smoker    COPD (chronic obstructive pulmonary disease) (HCC)    Diverticulosis of colon    Elevated prostate specific antigen (PSA)    ETOH abuse    GERD (gastroesophageal reflux disease)    Hx of colonic polyps    Hyperlipidemia    Hypertension    Pulmonary nodule    Seizures (HCC)    Shortness of breath    Stroke [facility])    Past Surgical History:  Procedure Laterality Date   COLONOSCOPY     ESOPHAGOGASTRODUODENOSCOPY (EGD) WITH PROPOFOL N/A 07/21/2018   Procedure: ESOPHAGOGASTRODUODENOSCOPY (EGD) WITH PROPOFOL;  Surgeon: Shellia Cleverly, DO;  Location:  [facility] ENDOSCOPY;  Service: Gastroenterology;  Laterality: N/A;   needle biopsy RUL nodule  01/2009   benign   TONSILLECTOMY  1947   VASECTOMY     Social History:  reports that he has been smoking cigarettes. He has a 55 pack-year smoking history. He has never used smokeless tobacco. He reports current alcohol use of about 24.0 standard drinks of alcohol per week. He reports that he does not use drugs.  No Known Allergies  Family History  Problem Relation Age of Onset   Diabetes Father    Lung cancer Brother    Colon cancer Neg Hx    Esophageal cancer Neg Hx    Stomach cancer Neg Hx     Prior to Admission  medications   Medication Sig Start Date End Date Taking? Authorizing Provider  acetaminophen (TYLENOL) 325 MG tablet Take 2 tablets (650 mg total) by mouth every 6 (six) hours as needed for mild pain, fever or headache. 11/12/22   Lonia Blood, MD  apixaban (ELIQUIS) 2.5 MG TABS tablet Take 1 tablet (2.5 mg total) by mouth 2 (two) times daily. 11/19/22   Lonia Blood, MD  atorvastatin (LIPITOR) 20 MG tablet Take 1 tablet (20 mg total) by mouth daily. 11/12/22 02/10/23  Lonia Blood, MD  levETIRAcetam (KEPPRA) 1000 MG tablet Take 1 tablet (1,000 mg total) by mouth every 12 (twelve) hours. 11/12/22   Lonia Blood, MD  thiamine 100 MG tablet Take 1 tablet (100 mg total) by mouth daily. Patient not taking: Reported on 11/11/2022 09/15/21   Glade Lloyd, MD    Physical Exam: Vitals:   07/03/23 1230 07/03/23 1252 07/03/23 1345 07/03/23 1351  BP: 110/64 115/82 113/62   Pulse: 89 86 86   Resp: 20 17 20    Temp:    99.3 F (37.4 C)  TempSrc:    Oral  SpO2: 100% 100% 97%   Weight:      Height:       1.  General: Patient lying supine in bed,  no acute distress   2. Psychiatric: Alert and oriented x 3, mood and behavior normal for situation, pleasant and cooperative with exam   3. Neurologic: Speech and language are normal, face is symmetric, moves all 4 extremities voluntarily, left upper extremity grip strength weakness, difficulty with finger-to-nose on the left, chronic right pupil abnormality   4. HEENMT:  Head is atraumatic, normocephalic, chronic right pupil abnormality, neck is supple, trachea is midline, mucous membranes are moist   5. Respiratory : Lungs are clear to auscultation bilaterally without wheezing, rhonchi, rales, no cyanosis, no increase in work of breathing or accessory muscle use   6. Cardiovascular : Heart rate normal, rhythm is irregular, no rubs or gallops, no peripheral edema, peripheral pulses palpated   7. Gastrointestinal:  Abdomen is  soft, nondistended, nontender to palpation bowel sounds active, no masses or organomegaly palpated   8. Skin:  Skin is warm, dry and intact without rashes, acute lesions, or ulcers on limited exam   9.Musculoskeletal:  No acute deformities or trauma, no asymmetry in tone, no peripheral edema, peripheral pulses palpated, no tenderness to palpation in the extremities  Data Reviewed: In the ED Workup is remarkable for leukocytosis at 13.5, and an elevated creatinine which is at patient's baseline of 1.58 UA is not indicative of UTI Alcohol levels less than 10 CT head has no acute intracranial pathology, but does show age-related atrophy and chronic microvascular ischemic changes Physical exam was significant  for left upper extremity weakness per ED provider so neurohospitalist was consulted Neuro hospitalist recommends admission for CVA workup   Assessment and Plan: * CVA (cerebral vascular accident) (HCC) -Continue statin -CT is without acute changes -MRI pending -Defer further head and neck imaging to neurohospitalist -PT/OT/ST eval and treat -Echo pending -Continue to monitor  Atrial fibrillation, chronic (HCC) -Not currently on anticoagulation -Rate controlled at admission in the 80's -Echo in the AM  -Continue to monitor  Chronic kidney disease, stage 3b (HCC) -GFR 43 -This is at his baseline -Cr is at baseline -Continue to monitor  Seizure disorder (HCC) -Defer medication management to neurohospitalist -For now, continue Keppra -Seizure precautions -Continue to monitor  Hyperlipidemia -Continue Statin        Advance Care Planning:   Code Status: Full Code  Consults: Neurohospitalist  Family Communication: No family at bedside  Severity of Illness: The appropriate patient status for this patient is OBSERVATION. Observation status is judged to be reasonable and necessary in order to provide the required intensity of service to ensure the patient's safety.  The patient's presenting symptoms, physical exam findings, and initial radiographic and laboratory data in the context of their medical condition is felt to place them at decreased risk for further clinical deterioration. Furthermore, it is anticipated that the patient will be medically stable for discharge from the hospital within 2 midnights of admission.   Author: Lilyan Gilford, DO 07/03/2023 8:50 PM  For on call review [email or web address].

## 2023-07-03 NOTE — ED Notes (Signed)
RN spoke with Blake [name] to get an update on pt baseline. [name] Stated pt is does well at home and take cares of himself fine and he has a hx dementia.

## 2023-07-03 NOTE — ED Provider Notes (Signed)
This patient was received at change of shift, there has been some changes in mental status today and that the family member reports to me that he has been unable to walk when he gets up out of bed, he has had some issues with tremor earlier in the day but there was no visible loss of consciousness or definite seizure activity or postictal phase.  His workup here has been unremarkable with urinalysis labs metabolic panel CBC and CT scan of the brain however on my exam at the change of shift the patient has left arm weakness with pronator drift and has difficulty with finger-nose-finger on the left arm with dysmetria.  He has a prior surgical finding in his right eye with an abnormally shaped pupil which is normal for the patient but no other cranial nerve abnormalities.  He is able to lift both legs and is able to do heel shin but has an unstable gait and cannot walk by himself.  Given these findings with some focal neurofindings we will admit the patient to the hospital for the rest of the stroke workup.  His vital signs have been reassuring.  He is not febrile and is not tachycardic nor hypotensive..  The patient and family member are agreeable.   I have reviewed the prior medical record and the MRI from April 2020 showed that the patient had a 8 mm acute infarct within the right caudate head, there is no hemorrhage or mass effect.  He has had multiple MRIs since that time without acute findings  He is treated for seizures with Keppra, he was supposed to be on an increased dose but his family doctor had not written for the increased dose after his last visit to the hospital where it was increased.  Final diagnoses:  Seizure-like activity (HCC)  Left arm weakness      Eber Hong, MD 07/04/23 [phone]

## 2023-07-03 NOTE — ED Notes (Addendum)
This Clinical research associate was informed that PT's ride was here to pick him up. PT was dressed, condom cath removed and PT was wheeled outside to his ride. PT's ride then informed this Clinical research associate that PT "wasn't acting like his normal self and I won't take him home like this."  PT moved back inside, placed back in hallway bed 9 and provider Theresia Lo MD made aware of same.

## 2023-07-03 NOTE — ED Provider Notes (Addendum)
[place] EMERGENCY DEPARTMENT AT [facility] Provider Note   CSN: [id number] Arrival date & time: 07/03/23  6962     History  Chief Complaint  Patient presents with   Shaking    Blake [name] is a 82 y.o. male.  Patient is an 82 year old male with a past medical history of seizure disorder, alcohol use disorder, A-fib no longer on anticoagulation presenting to the emergency department with seizure-like activity.  Spoke with patient's POA who he lives at that home who states that his wife went to give him his medicine around 5:15 AM. She found him shaking all over in bed like he was having a seizure. He was responsive during this episode stating he "felt weird". He is not currently prescribed the Eliquis. Hasn't missed any doses of his Keppra and taking 1000 mg BID as prescribed. Has not had any alcohol since last hospitalization. No illnesses recently.  Per EMS the patient was having shaking seizure-like activity and route but was responsive during this episode, had no urinary incontinence and no tongue bite.  The patient states that he is unsure what happened this morning or why he was brought to the emergency department.  The history is provided by the patient, the EMS personnel and a caregiver.       Home Medications Prior to Admission medications   Medication Sig Start Date End Date Taking? Authorizing Provider  acetaminophen (TYLENOL) 325 MG tablet Take 2 tablets (650 mg total) by mouth every 6 (six) hours as needed for mild pain, fever or headache. 11/12/22   Lonia Blood, MD  apixaban (ELIQUIS) 2.5 MG TABS tablet Take 1 tablet (2.5 mg total) by mouth 2 (two) times daily. 11/19/22   Lonia Blood, MD  atorvastatin (LIPITOR) 20 MG tablet Take 1 tablet (20 mg total) by mouth daily. 11/12/22 02/10/23  Lonia Blood, MD  levETIRAcetam (KEPPRA) 1000 MG tablet Take 1 tablet (1,000 mg total) by mouth every 12 (twelve) hours. 11/12/22   Lonia Blood, MD   thiamine 100 MG tablet Take 1 tablet (100 mg total) by mouth daily. Patient not taking: Reported on 11/11/2022 09/15/21   Glade Lloyd, MD      Allergies    Patient has no known allergies.    Review of Systems   Review of Systems  Physical Exam Updated Vital Signs BP 113/62   Pulse 86   Temp 99.3 F (37.4 C) (Oral)   Resp 20   Ht 5\' 11"  (1.803 m)   Wt 68.6 kg   SpO2 97%   BMI 21.09 kg/m  Physical Exam Vitals and nursing note reviewed.  Constitutional:      General: He is not in acute distress.    Appearance: Normal appearance.  HENT:     Head: Normocephalic and atraumatic.     Nose: Nose normal.     Mouth/Throat:     Mouth: Mucous membranes are moist.     Pharynx: Oropharynx is clear.     Comments: No tongue bite Eyes:     Extraocular Movements: Extraocular movements intact.     Conjunctiva/sclera: Conjunctivae normal.     Comments: R eye with surgical pupil  Cardiovascular:     Rate and Rhythm: Normal rate and regular rhythm.     Heart sounds: Normal heart sounds.  Pulmonary:     Effort: Pulmonary effort is normal.     Breath sounds: Normal breath sounds.  Abdominal:     General: Abdomen is flat.  Palpations: Abdomen is soft.     Tenderness: There is no abdominal tenderness.  Musculoskeletal:        General: Normal range of motion.     Cervical back: Normal range of motion.  Skin:    General: Skin is warm and dry.  Neurological:     General: No focal deficit present.     Mental Status: He is alert.     Cranial Nerves: No cranial nerve deficit.     Sensory: No sensory deficit.     Motor: No weakness.  Psychiatric:        Mood and Affect: Mood normal.        Behavior: Behavior normal.     ED Results / Procedures / Treatments   Labs (all labs ordered are listed, but only abnormal results are displayed) Labs Reviewed  CBC WITH DIFFERENTIAL/PLATELET - Abnormal; Notable for the following components:      Result Value   WBC 13.5 (*)    RBC 3.83  (*)    Hemoglobin 12.5 (*)    HCT 35.9 (*)    Neutro Abs 11.2 (*)    All other components within normal limits  COMPREHENSIVE METABOLIC PANEL - Abnormal; Notable for the following components:   Sodium 134 (*)    Glucose, Bld 107 (*)    Creatinine, Ser 1.58 (*)    AST 14 (*)    GFR, Estimated 43 (*)    All other components within normal limits  URINALYSIS, W/ REFLEX TO CULTURE (INFECTION SUSPECTED)  ETHANOL  MAGNESIUM  LEVETIRACETAM LEVEL  CBG MONITORING, ED    EKG None  Radiology No results found.  Procedures Procedures    Medications Ordered in ED Medications  LORazepam (ATIVAN) tablet 1-4 mg (has no administration in time range)    Or  LORazepam (ATIVAN) injection 1-4 mg (has no administration in time range)  thiamine (VITAMIN B1) tablet 100 mg (100 mg Oral Not Given 07/03/23 1000)    Or  thiamine (VITAMIN B1) injection 100 mg ( Intravenous See Alternative 07/03/23 1000)  folic acid (FOLVITE) tablet 1 mg (0 mg Oral Hold 07/03/23 1000)  multivitamin with minerals tablet 1 tablet (0 tablets Oral Hold 07/03/23 1000)  ondansetron (ZOFRAN) injection 4 mg (4 mg Intravenous Given 07/03/23 1030)    ED Course/ Medical Decision Making/ A&P Clinical Course as of 07/03/23 1546  Sun Jul 03, 2023  1104 Electrolyte within normal range. No obvious cause of tremors, does not appear to be seizure when he was awake and alert during the episode with no apparent post-ictal period. [VK]  1252 Patient remains at neurologic baseline without any shaking episodes in the ER. Will have ambulatory trial. [VK]  1333 Patient refused to walk but appears to be at baseline. Spoke with patient's POA who will pick him up.  [VK]  1545 When POA came to pick him up reports he is not back to baseline. States he is normally up and ambulatory and him refusing to walk is unlike him. Otherwise normal for him to not know what medications he takes and is disoriented to situation due to dementia. Will have CTH.  Patient signed out to Dr. Hyacinth Meeker pending [facility] and reassessment. [VK]    Clinical Course User Index [VK] Rexford Maus, DO                                 Medical Decision Making This patient presents  to the ED with chief complaint(s) of seizure-like activity with pertinent past medical history of seizure disorder, ETOH use disorder, a fib which further complicates the presenting complaint. The complaint involves an extensive differential diagnosis and also carries with it a high risk of complications and morbidity.    The differential diagnosis includes hypo or hyperglycemia, arrhythmia, dehydration, electrolyte abnormality, breakthrough seizure, withdrawal seizure, intoxication  Additional history obtained: Additional history obtained from family and EMS  Records reviewed previous admission documents  ED Course and Reassessment: On patient's arrival he is hemodynamically stable in no acute distress, appears to be at his neurologic baseline without any active shaking or seizure-like activity.  Patient will have Accu-Chek, labs and EKG.  He will be placed on seizure precautions.  Per family he has not had a drink since February so less likely to be alcohol withdrawal.  Patient will be closely reassessed.  Independent labs interpretation:  The following labs were independently interpreted: at baseline/within normal range  Independent visualization of imaging: - N/A  Consultation: - Consulted or discussed management/test interpretation w/ external professional: N/A  Consideration for admission or further workup: Patient has no emergent conditions requiring admission or further work-up at this time and is stable for discharge home with primary care and neurology follow-up  Social Determinants of health: N/A    Amount and/or Complexity of Data Reviewed Labs: ordered. Radiology: ordered.  Risk OTC drugs. Prescription drug management.          Final Clinical  Impression(s) / ED Diagnoses Final diagnoses:  Seizure-like activity (HCC)    Rx / DC Orders ED Discharge Orders          Ordered    Ambulatory referral to Neurology       Comments: An appointment is requested in approximately: 4 weeks   07/03/23 1337              Rexford Maus, DO 07/03/23 1339    Theresia Lo [place], [place] 07/03/23 1546

## 2023-07-03 NOTE — Assessment & Plan Note (Signed)
Continue Statin 

## 2023-07-03 NOTE — Assessment & Plan Note (Signed)
-  GFR 43 -This is at his baseline -Cr is at baseline -Continue to monitor

## 2023-07-03 NOTE — ED Notes (Signed)
Pt's temperature was taken for discharge.  Pt's temp was noted to be slightly elevated. Dr. Theresia Lo made aware via secure chat.

## 2023-07-03 NOTE — ED Notes (Signed)
Pt's Alert to self and place.  Pt normally lives alone and takes care of himself.  Pt was not willing to walk for paramedic.  He stated, "I don't want to walk. I just want to rest here for now." PT states he walks without a walker at baseline.

## 2023-07-03 NOTE — Discharge Instructions (Signed)
You were seen in the emergency department for your seizure-like activity.  Your workup showed no signs of abnormal electrolytes or obvious cause of his seizure and you had no seizures while you are in the emergency department.  It is unclear what was causing the symptoms this morning or if you had a true seizure.  You should continue to take your Keppra as prescribed and I have given you a referral to outpatient neurology.  They should be calling you with a follow-up appointment however if you do not hear from them within the next few days you should call them to schedule follow-up.  You can otherwise follow-up with your primary doctor in the meantime.  You should return to the emergency department if you have a prolonged seizure lasting more than 5 to 10 minutes, back-to-back seizures without returning to your normal self, you injure yourself during a seizure or if you have any other new or concerning symptoms.

## 2023-07-03 NOTE — Assessment & Plan Note (Signed)
-  Defer medication management to neurohospitalist -For now, continue Keppra -Seizure precautions -Continue to monitor

## 2023-07-03 NOTE — Assessment & Plan Note (Signed)
-  Continue statin -CT is without acute changes -MRI pending -Defer further head and neck imaging to neurohospitalist -PT/OT/ST eval and treat -Echo pending -Continue to monitor

## 2023-07-03 NOTE — ED Notes (Signed)
Upon entering room pt was dry heaving and spitting out thick clear saliva. RN notified EDP

## 2023-07-03 NOTE — Consult Note (Signed)
Neurology Consultation  Reason for Consult: Shaking, left-sided weakness, altered mental status Referring Physician: Dr. Hyacinth Meeker  CC: Generalized shaking, left-sided weakness, altered mental status  History is obtained from: Chart, patient-unreliable historian  HPI: Blake [name] is a 82 y.o. male past medical history of COPD, A-fib not on anticoagulation, alcohol abuse with last drink several months ago, hypertension, hyperlipidemia, seizure disorder on Keppra, brought in for evaluation of generalized shaking.  Patient thinks he is brought in for seizures but could not describe the event.  Per chart review, wife noted him to be shaking all over but he was still able to communicate and be responsive.  There was no truly postictal he was evaluated in the ED.  Labs and brain imaging unremarkable.  Decision was to discharge home but when the POA came to pick the patient up, they reported that he is not at his baseline-his ambulation is worse and so is his mental status. On repeat EDP assessment, he had some left-sided transient weakness that then resolved. Neurology was consulted for further evaluation Patient is unable to provide any clear history. Prior history in the chart reviewed-has been seen by neurology for breakthrough seizures and prolonged postictal state with left-sided weakness.  LKW: Sometime last night-unclear time. IV thrombolysis given?: no, unclear last known well, unclear clinical picture for seizures versus stroke EVT: No-same reason as above Premorbid modified Rankin scale (mRS): Unable to reliably ascertain  ROS: Unable to reliably ascertain due to his mentation-see exam below  Past Medical History:  Diagnosis Date   Chronic insomnia    Cigarette smoker    COPD (chronic obstructive pulmonary disease) (HCC)    Diverticulosis of colon    Elevated prostate specific antigen (PSA)    ETOH abuse    GERD (gastroesophageal reflux disease)    Hx of colonic polyps     Hyperlipidemia    Hypertension    Pulmonary nodule    Seizures (HCC)    Shortness of breath    Stroke (HCC)    Family History  Problem Relation Age of Onset   Diabetes Father    Lung cancer Brother    Colon cancer Neg Hx    Esophageal cancer Neg Hx    Stomach cancer Neg Hx    Social History:   reports that he has been smoking cigarettes. He has a 55 pack-year smoking history. He has never used smokeless tobacco. He reports current alcohol use of about 24.0 standard drinks of alcohol per week. He reports that he does not use drugs.  Medications  Current Facility-Administered Medications:    folic acid (FOLVITE) tablet 1 mg, 1 mg, Oral, Daily, Kingsley, Victoria K, DO   LORazepam (ATIVAN) tablet 1-4 mg, 1-4 mg, Oral, Q1H PRN **OR** LORazepam (ATIVAN) injection 1-4 mg, 1-4 mg, Intravenous, Q1H PRN, Theresia Lo, Victoria K, DO   multivitamin with minerals tablet 1 tablet, 1 tablet, Oral, Daily, Kingsley, Victoria K, DO   thiamine (VITAMIN B1) tablet 100 mg, 100 mg, Oral, Daily **OR** thiamine (VITAMIN B1) injection 100 mg, 100 mg, Intravenous, Daily, Theresia Lo, Victoria K, DO  Current Outpatient Medications:    levETIRAcetam (KEPPRA) 1000 MG tablet, Take 1 tablet (1,000 mg total) by mouth every 12 (twelve) hours., Disp: 60 tablet, Rfl: 2   lisinopril (ZESTRIL) 5 MG tablet, Take 5 mg by mouth daily., Disp: , Rfl:    Exam: Current vital signs: BP 113/62   Pulse 86   Temp 99.3 F (37.4 C) (Oral)   Resp 20  Ht 5\' 11"  (1.803 m)   Wt 68.6 kg   SpO2 97%   BMI 21.09 kg/m  Vital signs in last 24 hours: Temp:  [98.3 F (36.8 C)-99.3 F (37.4 C)] 99.3 F (37.4 C) (10/06 1351) Pulse Rate:  [34-153] 86 (10/06 1345) Resp:  [14-27] 20 (10/06 1345) BP: (108-140)/(45-110) 113/62 (10/06 1345) SpO2:  [87 %-100 %] 97 % (10/06 1345) Weight:  [68.6 kg] 68.6 kg (10/06 0700) General: He is awake alert in no distress HEENT: Normocephalic atraumatic Lungs: Clear Cardiovascular: Regular rate  rhythm Neurological exam He is awake alert oriented to self He is not clear why he is in the ER and what brought him in His speech is mildly dysarthric There is no aphasia Poor attention concentration Cranials nerves: Right pupil is irregular and does not react much, left is round and reactive, otherwise unremarkable cranial nerve exam Motor examination: No drift in upper extremities.  Symmetric drift in bilateral lower extremities Sensation intact Coordination difficult to assess Gait testing deferred   Labs I have reviewed labs in epic and the results pertinent to this consultation are:  CBC    Component Value Date/Time   WBC 13.5 (H) 07/03/2023 0702   RBC 3.83 (L) 07/03/2023 0702   HGB 12.5 (L) 07/03/2023 0702   HCT 35.9 (L) 07/03/2023 0702   PLT 259 07/03/2023 0702   MCV 93.7 07/03/2023 0702   MCH 32.6 07/03/2023 0702   MCHC 34.8 07/03/2023 0702   RDW 13.1 07/03/2023 0702   LYMPHSABS 1.2 07/03/2023 0702   MONOABS 0.9 07/03/2023 0702   EOSABS 0.1 07/03/2023 0702   BASOSABS 0.1 07/03/2023 0702    CMP     Component Value Date/Time   NA 134 (L) 07/03/2023 0938   K 4.9 07/03/2023 0938   CL 98 07/03/2023 0938   CO2 26 07/03/2023 0938   GLUCOSE 107 (H) 07/03/2023 0938   BUN 16 07/03/2023 0938   CREATININE 1.58 (H) 07/03/2023 0938   CALCIUM 9.0 07/03/2023 0938   PROT 6.5 07/03/2023 0938   ALBUMIN 3.8 07/03/2023 0938   AST 14 (L) 07/03/2023 0938   ALT 10 07/03/2023 0938   ALKPHOS 98 07/03/2023 0938   BILITOT 0.8 07/03/2023 0938   GFRNONAA 43 (L) 07/03/2023 0938   GFRAA 41 (L) 04/22/2020 0435    Lipid Panel     Component Value Date/Time   CHOL 147 03/07/2020 0609   TRIG 78 03/07/2020 0609   HDL 47 03/07/2020 0609   CHOLHDL 3.1 03/07/2020 0609   VLDL 16 03/07/2020 0609   LDLCALC 84 03/07/2020 0609   LDLDIRECT 79.9 09/30/2010 1211    Lab Results  Component Value Date   HGBA1C 5.4 09/14/2021      Imaging I have reviewed the images obtained: CT head  no acute findings, chronic small vessel disease, ventriculomegaly.    Assessment: 83 year old past history of COPD, A-fib, alcohol use, seizure brought in for evaluation of generalized shaking and still being altered and off of his baseline. On one of the EDP examinations, he had flaccid left lower extremity-that weakness was transient and improved.  Prior notes do mention prolonged postictal state with left-sided weakness as Todd's paralysis which could have been the case today but he also has A-fib not on anticoagulation which could have made a stroke/TIA possible. I would recommend further workup and admission to the hospitalist  Impression: Evaluate for seizure followed by Todd's paralysis versus new acute stroke given history of atrial fibrillation not on anticoagulation and not  being back to baseline in terms of his mentation per family.  Recommendations: Admit to hospitalist MRI of the brain without contrast If it confirms a stroke, will need full risk factor workup including CT angio head and neck, A1c, lipid panel, 2D echo, telemetry and frequent neurochecks.  Would not pursue a full stroke workup unless the MRI reveals a stroke. Currently not on anticoagulation-given his risk factors and CHA2DS2-VASc score I would imagine he would be a candidate for anticoagulation-will continue to follow and recommend based on what the results, to be Routine EEG Continue home Keppra Seizure precautions Plan was relayed to the admitting hospitalist-Dr.Zierle-Ghosh   Addendum: MRI completed-no acute stroke on my review-formal read pending. Possible diffusion changes in RT mesial temporal lobe, could be related to seizure. Please follow the formal read and recommendations as above.  -- Milon Dikes, MD Neurologist [facility] Pager: [phone]

## 2023-07-03 NOTE — ED Triage Notes (Signed)
Pt BIB [facility] from home d/t "seizure like activity".  Pt does have hx of Sz but today appeared Tonic/clonic but talking the whole time and no Post-ictal/ no urinary incontinence, etc.

## 2023-07-04 ENCOUNTER — Encounter (HOSPITAL_COMMUNITY): Payer: Self-pay | Admitting: Family Medicine

## 2023-07-04 ENCOUNTER — Observation Stay (HOSPITAL_COMMUNITY): Payer: Medicare Other

## 2023-07-04 DIAGNOSIS — R569 Unspecified convulsions: Principal | ICD-10-CM

## 2023-07-04 DIAGNOSIS — I441 Atrioventricular block, second degree: Secondary | ICD-10-CM

## 2023-07-04 LAB — LEVETIRACETAM LEVEL: Levetiracetam Lvl: 144.4 ug/mL — ABNORMAL HIGH (ref 10.0–40.0)

## 2023-07-04 LAB — CBC WITH DIFFERENTIAL/PLATELET
Abs Immature Granulocytes: 0.06 10*3/uL (ref 0.00–0.07)
Basophils Absolute: 0.1 10*3/uL (ref 0.0–0.1)
Basophils Relative: 0 %
Eosinophils Absolute: 0 10*3/uL (ref 0.0–0.5)
Eosinophils Relative: 0 %
HCT: 34.6 % — ABNORMAL LOW (ref 39.0–52.0)
Hemoglobin: 12.1 g/dL — ABNORMAL LOW (ref 13.0–17.0)
Immature Granulocytes: 1 %
Lymphocytes Relative: 9 %
Lymphs Abs: 1 10*3/uL (ref 0.7–4.0)
MCH: 33.6 pg (ref 26.0–34.0)
MCHC: 35 g/dL (ref 30.0–36.0)
MCV: 96.1 fL (ref 80.0–100.0)
Monocytes Absolute: 0.7 10*3/uL (ref 0.1–1.0)
Monocytes Relative: 5 %
Neutro Abs: 10.1 10*3/uL — ABNORMAL HIGH (ref 1.7–7.7)
Neutrophils Relative %: 85 %
Platelets: 216 10*3/uL (ref 150–400)
RBC: 3.6 MIL/uL — ABNORMAL LOW (ref 4.22–5.81)
RDW: 12.9 % (ref 11.5–15.5)
WBC: 11.9 10*3/uL — ABNORMAL HIGH (ref 4.0–10.5)
nRBC: 0 % (ref 0.0–0.2)

## 2023-07-04 LAB — COMPREHENSIVE METABOLIC PANEL
ALT: 10 U/L (ref 0–44)
AST: 21 U/L (ref 15–41)
Albumin: 3.2 g/dL — ABNORMAL LOW (ref 3.5–5.0)
Alkaline Phosphatase: 86 U/L (ref 38–126)
Anion gap: 10 (ref 5–15)
BUN: 15 mg/dL (ref 8–23)
CO2: 22 mmol/L (ref 22–32)
Calcium: 8.5 mg/dL — ABNORMAL LOW (ref 8.9–10.3)
Chloride: 99 mmol/L (ref 98–111)
Creatinine, Ser: 1.55 mg/dL — ABNORMAL HIGH (ref 0.61–1.24)
GFR, Estimated: 44 mL/min — ABNORMAL LOW (ref >60)
Glucose, Bld: 127 mg/dL — ABNORMAL HIGH (ref 70–99)
Potassium: 4.4 mmol/L (ref 3.5–5.1)
Sodium: 131 mmol/L — ABNORMAL LOW (ref 135–145)
Total Bilirubin: 0.9 mg/dL (ref 0.3–1.2)
Total Protein: 5.8 g/dL — ABNORMAL LOW (ref 6.5–8.1)

## 2023-07-04 LAB — TSH: TSH: 0.349 u[IU]/mL — ABNORMAL LOW (ref 0.350–4.500)

## 2023-07-04 LAB — MAGNESIUM: Magnesium: 1.9 mg/dL (ref 1.7–2.4)

## 2023-07-04 LAB — T4, FREE: Free T4: 1.23 ng/dL — ABNORMAL HIGH (ref 0.61–1.12)

## 2023-07-04 MED ORDER — HEPARIN SODIUM (PORCINE) 5000 UNIT/ML IJ SOLN
5000.0000 [IU] | Freq: Three times a day (TID) | INTRAMUSCULAR | Status: DC
  Administered 2023-07-04 – 2023-07-05 (×4): 5000 [IU] via SUBCUTANEOUS
  Filled 2023-07-04 (×4): qty 1

## 2023-07-04 MED ORDER — ONDANSETRON HCL 4 MG PO TABS: 4.0000 mg | ORAL_TABLET | Freq: Four times a day (QID) | ORAL | Status: DC | PRN

## 2023-07-04 MED ORDER — OXYCODONE HCL 5 MG PO TABS: 5.0000 mg | ORAL_TABLET | ORAL | Status: DC | PRN

## 2023-07-04 MED ORDER — ACETAMINOPHEN 325 MG PO TABS: 650.0000 mg | ORAL_TABLET | Freq: Four times a day (QID) | ORAL | Status: DC | PRN

## 2023-07-04 MED ORDER — ACETAMINOPHEN 650 MG RE SUPP: 650.0000 mg | Freq: Four times a day (QID) | RECTAL | Status: DC | PRN

## 2023-07-04 MED ORDER — ONDANSETRON HCL 4 MG/2ML IJ SOLN: 4.0000 mg | Freq: Four times a day (QID) | INTRAMUSCULAR | Status: DC | PRN

## 2023-07-04 MED ORDER — LEVETIRACETAM 500 MG PO TABS
1000.0000 mg | ORAL_TABLET | Freq: Two times a day (BID) | ORAL | Status: DC
  Administered 2023-07-04 – 2023-07-05 (×3): 1000 mg via ORAL
  Filled 2023-07-04 (×3): qty 2

## 2023-07-04 MED ORDER — ATORVASTATIN CALCIUM 10 MG PO TABS
20.0000 mg | ORAL_TABLET | Freq: Every day | ORAL | Status: DC
  Administered 2023-07-04 – 2023-07-05 (×2): 20 mg via ORAL
  Filled 2023-07-04 (×2): qty 2

## 2023-07-04 NOTE — ED Notes (Signed)
ED TO INPATIENT HANDOFF REPORT  ED Nurse Name and Phone #: Marchelle Folks [phone]  S Name/Age/Gender Blake [name] 82 y.o. male Room/Bed: 041C/041C  Code Status   Code Status: Full Code  Home/SNF/Other Home Patient oriented to: self and place Is this baseline? Yes   Triage Complete: Triage complete  Chief Complaint CVA (cerebral vascular accident) [facility]) [I63.9]  Triage Note Pt BIB [facility] from home d/t "seizure like activity".  Pt does have hx of Sz but today appeared Tonic/clonic but talking the whole time and no Post-ictal/ no urinary incontinence, etc.   Allergies No Known Allergies  Level of Care/Admitting Diagnosis ED Disposition     ED Disposition  Admit   Condition  --   Comment  Hospital Area: [facility] [[id number]]  Level of Care: Telemetry Medical [[id number]]  May place patient in observation at [facility] or [facility] if equivalent level of care is available:: Yes  Covid Evaluation: Asymptomatic - no recent exposure (last 10 days) testing not required  Diagnosis: CVA (cerebral vascular accident) [facility]) [[id number]]  Admitting Physician: Lilyan Gilford [[id number]]  Attending Physician: Lilyan Gilford [[id number]]          B Medical/Surgery History Past Medical History:  Diagnosis Date   Chronic insomnia    Cigarette smoker    COPD (chronic obstructive pulmonary disease) (HCC)    Diverticulosis of colon    Elevated prostate specific antigen (PSA)    ETOH abuse    GERD (gastroesophageal reflux disease)    Hx of colonic polyps    Hyperlipidemia    Hypertension    Pulmonary nodule    Seizures (HCC)    Shortness of breath    Stroke [facility])    Past Surgical History:  Procedure Laterality Date   COLONOSCOPY     ESOPHAGOGASTRODUODENOSCOPY (EGD) WITH PROPOFOL N/A 07/21/2018   Procedure: ESOPHAGOGASTRODUODENOSCOPY (EGD) WITH PROPOFOL;  Surgeon: Shellia Cleverly, DO;  Location: [facility] ENDOSCOPY;  Service: Gastroenterology;  Laterality: N/A;   needle  biopsy RUL nodule  01/2009   benign   TONSILLECTOMY  1947   VASECTOMY       A IV Location/Drains/Wounds Patient Lines/Drains/Airways Status     Active Line/Drains/Airways     Name Placement date Placement time Site Days   Peripheral IV 07/03/23 20 G Posterior;Right Forearm 07/03/23  2110  Forearm  1   External Urinary Catheter 07/03/23  2119  --  1            Intake/Output Last 24 hours No intake or output data in the 24 hours ending 07/04/23 1314  Labs/Imaging Results for orders placed or performed during the hospital encounter of 07/03/23 (from the past 48 hour(s))  CBC with Differential     Status: Abnormal   Collection Time: 07/03/23  7:02 AM  Result Value Ref Range   WBC 13.5 (H) 4.0 - 10.5 K/uL   RBC 3.83 (L) 4.22 - 5.81 MIL/uL   Hemoglobin 12.5 (L) 13.0 - 17.0 g/dL   HCT 21.3 (L) 08.6 - 57.8 %   MCV 93.7 80.0 - 100.0 fL   MCH 32.6 26.0 - 34.0 pg   MCHC 34.8 30.0 - 36.0 g/dL   RDW 46.9 62.9 - 52.8 %   Platelets 259 150 - 400 K/uL   nRBC 0.0 0.0 - 0.2 %   Neutrophils Relative % 83 %   Neutro Abs 11.2 (H) 1.7 - 7.7 K/uL   Lymphocytes Relative 9 %   Lymphs Abs 1.2 0.7 -  4.0 K/uL   Monocytes Relative 7 %   Monocytes Absolute 0.9 0.1 - 1.0 K/uL   Eosinophils Relative 0 %   Eosinophils Absolute 0.1 0.0 - 0.5 K/uL   Basophils Relative 1 %   Basophils Absolute 0.1 0.0 - 0.1 K/uL   Immature Granulocytes 0 %   Abs Immature Granulocytes 0.06 0.00 - 0.07 K/uL    Comment: Performed at [facility], [address], [place], [place] [address]  POC CBG, ED     Status: None   Collection Time: 07/03/23  7:56 AM  Result Value Ref Range   Glucose-Capillary 97 70 - 99 mg/dL    Comment: Glucose reference range applies only to samples taken after fasting for at least 8 hours.  Urinalysis, w/ Reflex to Culture (Infection Suspected) -Urine, Clean Catch     Status: None   Collection Time: 07/03/23  9:38 AM  Result Value Ref Range   Specimen Source URINE, CLEAN CATCH     Color, Urine YELLOW YELLOW   APPearance CLEAR CLEAR   Specific Gravity, Urine 1.012 1.005 - 1.030   pH 5.0 5.0 - 8.0   Glucose, UA NEGATIVE NEGATIVE mg/dL   Hgb urine dipstick NEGATIVE NEGATIVE   Bilirubin Urine NEGATIVE NEGATIVE   Ketones, ur NEGATIVE NEGATIVE mg/dL   Protein, ur NEGATIVE NEGATIVE mg/dL   Nitrite NEGATIVE NEGATIVE   Leukocytes,Ua NEGATIVE NEGATIVE   RBC / HPF 0-5 0 - 5 RBC/hpf   WBC, UA 0-5 0 - 5 WBC/hpf    Comment:        Reflex urine culture not performed if WBC <=10, OR if Squamous epithelial cells >5. If Squamous epithelial cells >5 suggest recollection.    Bacteria, UA NONE SEEN NONE SEEN   Squamous Epithelial / HPF 0-5 0 - 5 /HPF   Mucus PRESENT     Comment: Performed at [facility], [address] [place], [place] [address]  Ethanol     Status: None   Collection Time: 07/03/23  9:38 AM  Result Value Ref Range   Alcohol, Ethyl (B) <10 <10 mg/dL    Comment: (NOTE) Lowest detectable limit for serum alcohol is 10 mg/dL.  For medical purposes only. Performed at [facility], [address] [place], [place] [address]   Comprehensive metabolic panel     Status: Abnormal   Collection Time: 07/03/23  9:38 AM  Result Value Ref Range   Sodium 134 (L) 135 - 145 mmol/L   Potassium 4.9 3.5 - 5.1 mmol/L   Chloride 98 98 - 111 mmol/L   CO2 26 22 - 32 mmol/L   Glucose, Bld 107 (H) 70 - 99 mg/dL    Comment: Glucose reference range applies only to samples taken after fasting for at least 8 hours.   BUN 16 8 - 23 mg/dL   Creatinine, Ser 9.14 (H) 0.61 - 1.24 mg/dL   Calcium 9.0 8.9 - 78.2 mg/dL   Total Protein 6.5 6.5 - 8.1 g/dL   Albumin 3.8 3.5 - 5.0 g/dL   AST 14 (L) 15 - 41 U/L   ALT 10 0 - 44 U/L   Alkaline Phosphatase 98 38 - 126 U/L   Total Bilirubin 0.8 0.3 - 1.2 mg/dL   GFR, Estimated 43 (L) >60 mL/min    Comment: (NOTE) Calculated using the CKD-EPI Creatinine Equation (2021)    Anion gap 10 5 - 15    Comment: Performed at [facility], [address], [place], [place] [address]  Magnesium     Status: None   Collection Time: 07/03/23  9:38 AM  Result Value Ref Range   Magnesium 1.9 1.7 - 2.4 mg/dL    Comment: Performed at [facility], [address], [place], [place] [address]  Procalcitonin     Status: None   Collection Time: 07/03/23  8:30 PM  Result Value Ref Range   Procalcitonin <0.10 ng/mL    Comment:        Interpretation: PCT (Procalcitonin) <= 0.5 ng/mL: Systemic infection (sepsis) is not likely. Local bacterial infection is possible. (NOTE)       Sepsis PCT Algorithm           Lower Respiratory Tract                                      Infection PCT Algorithm    ----------------------------     ----------------------------         PCT < 0.25 ng/mL                PCT < 0.10 ng/mL          Strongly encourage             Strongly discourage   discontinuation of antibiotics    initiation of antibiotics    ----------------------------     -----------------------------       PCT 0.25 - 0.50 ng/mL            PCT 0.10 - 0.25 ng/mL               OR       >80% decrease in PCT            Discourage initiation of                                            antibiotics      Encourage discontinuation           of antibiotics    ----------------------------     -----------------------------         PCT >= 0.50 ng/mL              PCT 0.26 - 0.50 ng/mL               AND        <80% decrease in PCT             Encourage initiation of                                             antibiotics       Encourage continuation           of antibiotics    ----------------------------     -----------------------------        PCT >= 0.50 ng/mL                  PCT > 0.50 ng/mL               AND         increase in PCT  Strongly encourage                                      initiation of antibiotics    Strongly encourage escalation           of antibiotics                                      -----------------------------                                           PCT <= 0.25 ng/mL                                                 OR                                        > 80% decrease in PCT                                      Discontinue / Do not initiate                                             antibiotics  Performed at [facility], [address], [place], [place] [address]   Comprehensive metabolic panel     Status: Abnormal   Collection Time: 07/04/23  5:40 AM  Result Value Ref Range   Sodium 131 (L) 135 - 145 mmol/L   Potassium 4.4 3.5 - 5.1 mmol/L   Chloride 99 98 - 111 mmol/L   CO2 22 22 - 32 mmol/L   Glucose, Bld 127 (H) 70 - 99 mg/dL    Comment: Glucose reference range applies only to samples taken after fasting for at least 8 hours.   BUN 15 8 - 23 mg/dL   Creatinine, Ser 6.04 (H) 0.61 - 1.24 mg/dL   Calcium 8.5 (L) 8.9 - 10.3 mg/dL   Total Protein 5.8 (L) 6.5 - 8.1 g/dL   Albumin 3.2 (L) 3.5 - 5.0 g/dL   AST 21 15 - 41 U/L   ALT 10 0 - 44 U/L   Alkaline Phosphatase 86 38 - 126 U/L   Total Bilirubin 0.9 0.3 - 1.2 mg/dL   GFR, Estimated 44 (L) >60 mL/min    Comment: (NOTE) Calculated using the CKD-EPI Creatinine Equation (2021)    Anion gap 10 5 - 15    Comment: Performed at [facility], [address] [place], [place] [address]  Magnesium     Status: None   Collection Time: 07/04/23  5:40 AM  Result Value Ref Range   Magnesium 1.9 1.7 - 2.4 mg/dL    Comment: Performed at [facility], [address], [place], [place] [address]  CBC with Differential/Platelet  Status: Abnormal   Collection Time: 07/04/23  5:40 AM  Result Value Ref Range   WBC 11.9 (H) 4.0 - 10.5 K/uL   RBC 3.60 (L) 4.22 - 5.81 MIL/uL   Hemoglobin 12.1 (L) 13.0 - 17.0 g/dL   HCT 54.2 (L) 70.6 - 23.7 %   MCV 96.1 80.0 - 100.0 fL   MCH 33.6 26.0 - 34.0 pg   MCHC 35.0 30.0 - 36.0 g/dL   RDW 62.8 31.5 - 17.6 %   Platelets 216 150 - 400 K/uL   nRBC 0.0  0.0 - 0.2 %   Neutrophils Relative % 85 %   Neutro Abs 10.1 (H) 1.7 - 7.7 K/uL   Lymphocytes Relative 9 %   Lymphs Abs 1.0 0.7 - 4.0 K/uL   Monocytes Relative 5 %   Monocytes Absolute 0.7 0.1 - 1.0 K/uL   Eosinophils Relative 0 %   Eosinophils Absolute 0.0 0.0 - 0.5 K/uL   Basophils Relative 0 %   Basophils Absolute 0.1 0.0 - 0.1 K/uL   Immature Granulocytes 1 %   Abs Immature Granulocytes 0.06 0.00 - 0.07 K/uL    Comment: Performed at [facility], [address], [place], [place] [address]  TSH     Status: Abnormal   Collection Time: 07/04/23  5:40 AM  Result Value Ref Range   TSH 0.349 (L) 0.350 - 4.500 uIU/mL    Comment: Performed by a 3rd Generation assay with a functional sensitivity of <=0.01 uIU/mL. Performed at [facility], [address] [place], [place] [address]    MR BRAIN WO CONTRAST  Result Date: 07/03/2023 CLINICAL DATA:  Initial evaluation for neuro deficit, stroke suspected. Seizure activity. EXAM: MRI HEAD WITHOUT CONTRAST TECHNIQUE: Multiplanar, multiecho pulse sequences of the brain and surrounding structures were obtained without intravenous contrast. COMPARISON:  CT from earlier the same day. FINDINGS: Brain: Examination markedly degraded by motion artifact. Mild age-related cerebral atrophy. Patchy T2/FLAIR hyperintensity involving the periventricular and deep white matter, consistent with chronic small vessel ischemic disease, mild to moderate in nature. No evidence for acute or subacute ischemia. Diffusion signal abnormality involving the mesial right temporal lobe/hippocampal formation, likely reflecting changes related to seizure given history. Mildly increased associated FLAIR signal intensity, not well seen on this motion degraded exam. No mass lesion, mass effect, or midline shift. Ventricular prominence related to global parenchymal volume loss without hydrocephalus. No extra-axial fluid collection. Pituitary gland suprasellar region within normal  limits. Vascular: Major intracranial vascular flow voids are maintained. Skull and upper cervical spine: Craniocervical junction within normal limits. Bone marrow signal intensity normal. No scalp soft tissue abnormality. Sinuses/Orbits: Prior bilateral ocular lens replacement. Paranasal sinuses are largely clear. No significant mastoid effusion. Other: None. IMPRESSION: 1. Motion degraded exam. 2. Diffusion signal abnormality involving the mesial right temporal lobe/hippocampal formation, likely reflecting changes related to seizure given provided history. Correlation with EEG recommended. 3. Underlying age-related cerebral atrophy with mild to moderate chronic microvascular ischemic disease. Electronically Signed   By: Rise Mu M.D.   On: 07/03/2023 22:23   CT Head Wo Contrast  Result Date: 07/03/2023 CLINICAL DATA:  Altered mental status. EXAM: CT HEAD WITHOUT CONTRAST TECHNIQUE: Contiguous axial images were obtained from the base of the skull through the vertex without intravenous contrast. RADIATION DOSE REDUCTION: This exam was performed according to the departmental dose-optimization program which includes automated exposure control, adjustment of the mA and/or kV according to patient size and/or use of iterative reconstruction technique. COMPARISON:  Head CT dated 11/11/2022. FINDINGS: Brain: Moderate age-related atrophy and chronic microvascular ischemic changes. There is no acute intracranial hemorrhage. No mass effect or midline shift. No extra-axial fluid collection. Vascular: No hyperdense vessel or unexpected calcification. Skull: Normal. Negative for fracture or focal lesion. Sinuses/Orbits: Old nasal and left maxillary fractures. The visualized paranasal sinuses and mastoid air cells are clear. Other: None IMPRESSION: 1. No acute intracranial pathology. 2. Moderate age-related atrophy and chronic microvascular ischemic changes. Electronically Signed   By: Elgie Collard M.D.   On:  07/03/2023 18:16    Pending Labs Unresulted Labs (From admission, onward)     Start     Ordered   07/04/23 0918  T4, free  Add-on,   AD        07/04/23 0917   07/03/23 0845  Levetiracetam level  Once,   R        07/03/23 0845            Vitals/Pain Today's Vitals   07/04/23 1045 07/04/23 1108 07/04/23 1109 07/04/23 1109  BP: 106/60 (!) 121/50    Pulse: 65 92 86   Resp: 18 (!) 21 (!) 22   Temp:    98.9 F (37.2 C)  TempSrc:      SpO2: 94% (!) 89% 93%   Weight:      Height:      PainSc:        Isolation Precautions No active isolations  Medications Medications  LORazepam (ATIVAN) injection 1-4 mg (has no administration in time range)  thiamine (VITAMIN B1) tablet 100 mg (100 mg Oral Given 07/04/23 1107)    Or  thiamine (VITAMIN B1) injection 100 mg ( Intravenous See Alternative 07/04/23 1107)  folic acid (FOLVITE) tablet 1 mg (1 mg Oral Given 07/04/23 1115)  multivitamin with minerals tablet 1 tablet (1 tablet Oral Given 07/04/23 1107)  atorvastatin (LIPITOR) tablet 20 mg (20 mg Oral Given 07/04/23 1107)  levETIRAcetam (KEPPRA) tablet 1,000 mg (1,000 mg Oral Given 07/04/23 0547)  heparin injection 5,000 Units (5,000 Units Subcutaneous Given 07/04/23 0546)  acetaminophen (TYLENOL) tablet 650 mg (has no administration in time range)    Or  acetaminophen (TYLENOL) suppository 650 mg (has no administration in time range)  oxyCODONE (Oxy IR/ROXICODONE) immediate release tablet 5 mg (has no administration in time range)  ondansetron (ZOFRAN) tablet 4 mg (has no administration in time range)    Or  ondansetron (ZOFRAN) injection 4 mg (has no administration in time range)  ondansetron (ZOFRAN) injection 4 mg (4 mg Intravenous Given 07/03/23 1030)    Mobility walks with device     Focused Assessments Cardiac Assessment Handoff:    Lab Results  Component Value Date   CKTOTAL 766 (H) 01/10/2019   CKMB 6.2 (HH) 07/05/2012   TROPONINI 0.03 (HH) 01/10/2019   No results  found for: "DDIMER" Does the Patient currently have chest pain? No    R Recommendations: See Admitting Provider Note  Report given to:   Additional Notes:  pt currently in hospital bed.

## 2023-07-04 NOTE — Progress Notes (Signed)
PROGRESS NOTE    Blake [name]  GNF:[id number] DOB: [birth year] DOA: 07/03/2023 PCP: Patient, No Pcp Per  Chief Complaint  Patient presents with   Shaking    Brief Narrative:   Per H&P "Blake [name] is Blake [name] 82 y.o. male with medical history significant of chronic insomnia, tobacco use disorder less than Blake [name] pack per day, COPD, atrial fibrillation not on anticoagulation, history of alcohol use reported last drink several months ago, GERD, hyperlipidemia, hypertension, seizure disorder, and more presents the ED with Blake [name] chief complaint of shaking.  Patient has no recollection of the event.  It is reported that patient's wife was giving him medication around 5:15 in the morning when she found him shaking in his bed.  She reported it looked like Blake [name] seizure, but he was responsive the whole time.  He stated that he "felt weird."  He was apparently unable to walk following this event.  There was no postictal state, no definite loss of consciousness.  ED provider reported that on his exam patient had left upper extremity weakness with pronator drift and difficulty with finger-nose-finger.  Patient does not seem to have pronator drift during my exam.  He does have left hand grip strength weakness compared to right, and he is still having trouble with finger-nose-finger.  Neurohospitalist was consulted by the ER and recommended admission for CVA workup.  It is of note the patient has Blake [name] history of seizures but has not had any missed doses of Keppra.  It is reported that he has not drink alcohol since February, making withdrawal seizure very low on the differential.  Without loss of consciousness or postictal state, more likely that this was just tremors and not seizure activity.  Patient reports no complaints at this time.  He does not have any numbness or paresthesias at the time of my exam.  He is not in any pain.  He reports that he feels like he is in his normal state of health.  Again, patient has no recollection of  the events earlier today, so this history was obtained from chart review.  No family at bedside at the time of my exam.   Patient reports he smokes less than Blake [name] pack per day.  He does not need Blake [name] nicotine patch at this time per his report.  He has Blake [name] history of alcohol use disorder, but it is reported that his last drink was in February.  Around the side of caution, and monitor  with CIWA.   Full code by default as POA is not at bedside at the time of my exam."  Assessment & Plan:   Principal Problem:   CVA (cerebral vascular accident) [facility]) Active Problems:   Atrial fibrillation, chronic (HCC)   Hyperlipidemia   Seizure disorder (HCC)   Chronic kidney disease, stage 3b (HCC)  Seizure Todd's Paralysis -CT is without acute changes -MRI motion degraded, diffusion signal abnormality of the mesial R temporal lobe/hippocampal formation, likely reflecting changes related to seizure - mild to moderate chronic microvascular ischemic disease -given no acute stroke, will defer stroke workup   - continue home keppra per neurology - appreciate neurology recommendations  - seizure precautions -PT/OT/ST -Continue to monitor   Atrial fibrillation, chronic (HCC) -currently sinus rhythm -Echo from 2021 with EF 55-60% -will discuss restarting eliquis    Bradycardia  - appears to have PR lengthening with dropped beats, 2nd degree heart block type 1 - will need cardiology follow up   Abnormal Thyroid Function Tests -  low TSH -> repeat outside of acute illness - follow free T4  Chronic kidney disease, stage 3b (HCC) -GFR 43 -This is at his baseline -Cr is at baseline -Continue to monitor   Leukocytosis - mild, follow    Hyperlipidemia -Continue Statin    DVT prophylaxis: heparin, will discuss eliquis Code Status: full Family Communication: none Disposition:   Status is: Observation The patient remains OBS appropriate and will d/c before 2 midnights.   Consultants:   neurology  Procedures:  none  Antimicrobials:  Anti-infectives (From admission, onward)    None       Subjective: No complaints  Objective: Vitals:   07/04/23 0800 07/04/23 0806 07/04/23 0807 07/04/23 0809  BP: 103/65     Pulse: 66 (!) 45 (!) 55 (!) 55  Resp: 16 17 16 16   Temp:      TempSrc:      SpO2: 95% 93% 95% 93%  Weight:      Height:       No intake or output data in the 24 hours ending 07/04/23 0910 Filed Weights   07/03/23 0700  Weight: 68.6 kg    Examination:  General exam: Appears calm and comfortable  Respiratory system: unlabored Cardiovascular system: irregular, appears to have occasional dropped beats on tele Central nervous system: Alert and oriented. CN 2-12 intact.  Symmetric strength.  Extremities: no LEE   Data Reviewed: I have personally reviewed following labs and imaging studies  CBC: Recent Labs  Lab 07/03/23 0702 07/04/23 0540  WBC 13.5* 11.9*  NEUTROABS 11.2* 10.1*  HGB 12.5* 12.1*  HCT 35.9* 34.6*  MCV 93.7 96.1  PLT 259 216    Basic Metabolic Panel: Recent Labs  Lab 07/03/23 0938 07/04/23 0540  NA 134* 131*  K 4.9 4.4  CL 98 99  CO2 26 22  GLUCOSE 107* 127*  BUN 16 15  CREATININE 1.58* 1.55*  CALCIUM 9.0 8.5*  MG 1.9 1.9    GFR: Estimated Creatinine Clearance: 35.7 mL/min (Blake [name]) (by C-G formula based on SCr of 1.55 mg/dL (H)).  Liver Function Tests: Recent Labs  Lab 07/03/23 0938 07/04/23 0540  AST 14* 21  ALT 10 10  ALKPHOS 98 86  BILITOT 0.8 0.9  PROT 6.5 5.8*  ALBUMIN 3.8 3.2*    CBG: Recent Labs  Lab 07/03/23 0756  GLUCAP 97     No results found for this or any previous visit (from the past 240 hour(s)).       Radiology Studies: MR BRAIN WO CONTRAST  Result Date: 07/03/2023 CLINICAL DATA:  Initial evaluation for neuro deficit, stroke suspected. Seizure activity. EXAM: MRI HEAD WITHOUT CONTRAST TECHNIQUE: Multiplanar, multiecho pulse sequences of the brain and surrounding structures  were obtained without intravenous contrast. COMPARISON:  CT from earlier the same day. FINDINGS: Brain: Examination markedly degraded by motion artifact. Mild age-related cerebral atrophy. Patchy T2/FLAIR hyperintensity involving the periventricular and deep white matter, consistent with chronic small vessel ischemic disease, mild to moderate in nature. No evidence for acute or subacute ischemia. Diffusion signal abnormality involving the mesial right temporal lobe/hippocampal formation, likely reflecting changes related to seizure given history. Mildly increased associated FLAIR signal intensity, not well seen on this motion degraded exam. No mass lesion, mass effect, or midline shift. Ventricular prominence related to global parenchymal volume loss without hydrocephalus. No extra-axial fluid collection. Pituitary gland suprasellar region within normal limits. Vascular: Major intracranial vascular flow voids are maintained. Skull and upper cervical spine: Craniocervical junction within normal limits. Bone  marrow signal intensity normal. No scalp soft tissue abnormality. Sinuses/Orbits: Prior bilateral ocular lens replacement. Paranasal sinuses are largely clear. No significant mastoid effusion. Other: None. IMPRESSION: 1. Motion degraded exam. 2. Diffusion signal abnormality involving the mesial right temporal lobe/hippocampal formation, likely reflecting changes related to seizure given provided history. Correlation with EEG recommended. 3. Underlying age-related cerebral atrophy with mild to moderate chronic microvascular ischemic disease. Electronically Signed   By: Rise Mu M.D.   On: 07/03/2023 22:23   CT Head Wo Contrast  Result Date: 07/03/2023 CLINICAL DATA:  Altered mental status. EXAM: CT HEAD WITHOUT CONTRAST TECHNIQUE: Contiguous axial images were obtained from the base of the skull through the vertex without intravenous contrast. RADIATION DOSE REDUCTION: This exam was performed  according to the departmental dose-optimization program which includes automated exposure control, adjustment of the mA and/or kV according to patient size and/or use of iterative reconstruction technique. COMPARISON:  Head CT dated 11/11/2022. FINDINGS: Brain: Moderate age-related atrophy and chronic microvascular ischemic changes. There is no acute intracranial hemorrhage. No mass effect or midline shift. No extra-axial fluid collection. Vascular: No hyperdense vessel or unexpected calcification. Skull: Normal. Negative for fracture or focal lesion. Sinuses/Orbits: Old nasal and left maxillary fractures. The visualized paranasal sinuses and mastoid air cells are clear. Other: None IMPRESSION: 1. No acute intracranial pathology. 2. Moderate age-related atrophy and chronic microvascular ischemic changes. Electronically Signed   By: Elgie Collard M.D.   On: 07/03/2023 18:16        Scheduled Meds:  atorvastatin  20 mg Oral Daily   folic acid  1 mg Oral Daily   heparin  5,000 Units Subcutaneous Q8H   levETIRAcetam  1,000 mg Oral BID   multivitamin with minerals  1 tablet Oral Daily   thiamine  100 mg Oral Daily   Or   thiamine  100 mg Intravenous Daily   Continuous Infusions:   LOS: 0 days    Time spent: over 30 min    Lacretia Nicks, MD [facility]   To contact the attending provider between 7A-7P or the covering provider during after hours 7P-7A, please log into the web site [email or web address] and access using universal [place] password for that web site. If you do not have the password, please call the hospital operator.  07/04/2023, 9:10 AM

## 2023-07-04 NOTE — Consult Note (Signed)
ELECTROPHYSIOLOGY CONSULT NOTE    Patient ID: Blake [name] MRN: [id number], DOB/AGE: 1941/06/29 82 y.o.  Admit date: 07/03/2023 Date of Consult: 07/04/2023  Primary Physician: Patient, No Pcp Per Primary Cardiologist: None  Electrophysiologist: New consult  Referring Provider: Dr. Lowell Guitar   Patient Profile: Blake [name] is a 82 y.o. male with a history of AF (not on OAC), ETOH abuse, HTN, HLD, seizure disorder on Keppra (Todd's paralysis hx) & COPD who is being seen today for the evaluation of irregular heart rhythm at the request of Dr. Lowell Guitar.  HPI:  Blake [name] is a 82 y.o. male who presented to [facility] ER on 10/6 with concern for possible seizure.   Per family report the patient was found "shaking all over" but was still able to communicate / be responsive. There was no postictal state in ER. Brain imaging raised question of possible R temporal seizure. He was pending discharge home but when his family came to pick him up he was reportedly not at his baseline -ambulation was worse as well as mental status. Given worsened clinical state, he was worked up for possible CVA. While in patient on tele, he was noted to have dropped beats when asleep.    Of note, he has a hx of Mobitz I and was seen in 02/2020 by Dr. Johney Frame and was not symptomatic at that time. His V rates improved with exertion.   He denies chest pain, palpitations, dyspnea, PND, orthopnea, nausea, vomiting, dizziness, syncope, edema, weight gain, or early satiety.   Labs Potassium4.4 (10/07 0540) Magnesium  1.9 (10/07 0540) Creatinine, ser  1.55* (10/07 0540) PLT  216 (10/07 0540) HGB  12.1* (10/07 0540) WBC 11.9* (10/07 0540)  .    Past Medical History:  Diagnosis Date   Chronic insomnia    Cigarette smoker    COPD (chronic obstructive pulmonary disease) (HCC)    Diverticulosis of colon    Elevated prostate specific antigen (PSA)    ETOH abuse    GERD (gastroesophageal reflux disease)    Hx of colonic  polyps    Hyperlipidemia    Hypertension    Pulmonary nodule    Seizures (HCC)    Shortness of breath    Stroke [facility])      Surgical History:  Past Surgical History:  Procedure Laterality Date   COLONOSCOPY     ESOPHAGOGASTRODUODENOSCOPY (EGD) WITH PROPOFOL N/A 07/21/2018   Procedure: ESOPHAGOGASTRODUODENOSCOPY (EGD) WITH PROPOFOL;  Surgeon: Shellia Cleverly, DO;  Location: [facility] ENDOSCOPY;  Service: Gastroenterology;  Laterality: N/A;   needle biopsy RUL nodule  01/2009   benign   TONSILLECTOMY  1947   VASECTOMY       (Not in a hospital admission)   Inpatient Medications:   atorvastatin  20 mg Oral Daily   folic acid  1 mg Oral Daily   heparin  5,000 Units Subcutaneous Q8H   levETIRAcetam  1,000 mg Oral BID   multivitamin with minerals  1 tablet Oral Daily   thiamine  100 mg Oral Daily   Or   thiamine  100 mg Intravenous Daily    Allergies: No Known Allergies  Family History  Problem Relation Age of Onset   Diabetes Father    Lung cancer Brother    Colon cancer Neg Hx    Esophageal cancer Neg Hx    Stomach cancer Neg Hx      Physical Exam: Vitals:   07/04/23 1045 07/04/23 1108 07/04/23 1109 07/04/23 1109  BP:  106/60 (!) 121/50    Pulse: 65 92 86   Resp: 18 (!) 21 (!) 22   Temp:    98.9 F (37.2 C)  TempSrc:      SpO2: 94% (!) 89% 93%   Weight:      Height:        GEN- NAD, A&O x 3, normal affect HEENT: Normocephalic, atraumatic Lungs- CTAB, Normal effort.  Heart- Regular rate and rhythm, No M/G/R.  GI- Soft, NT, ND.  Extremities- No clubbing, cyanosis, or edema   Radiology/Studies:  ECHO 04/2020 > LVEF 55-60%   EKG: 10/7 SR with prolonged PR interval with dropped beat (personally reviewed)  TELEMETRY: 2nd Degree Mobitz 1 with intermittent non-sustained 2:1 (personally reviewed)     Assessment/Plan:  Second Degree AVB, Mobitz I with intermittent nonsustained 2:1  -patient asymptomatic with rhythm, similar presentation in 2021  -continue to  monitor as long as patient is not having new or worsening symptoms  -avoid AV nodal blocking agents    [facility] will sign off.    For questions or updates, please contact [facility] Please consult [email or web address] for contact info under Cardiology/STEMI.  Signed, Canary Brim, MSN, APRN, NP-C, AGACNP-BC [facility] - Electrophysiology  07/04/2023, 11:20 AM

## 2023-07-04 NOTE — Plan of Care (Signed)

## 2023-07-04 NOTE — Evaluation (Signed)
Occupational Therapy Evaluation Patient Details Name: Blake [name] MRN: [id number] DOB: 02/09/1941 Today's Date: 07/04/2023   History of Present Illness Pt is an 82 y/o male presenting to the ED with chief complaint of shaking.  Wife reported it looked like   a seizure.  MRI showed no acute stroke. EEG results pending.  PMHx:  smker, COPD, ETOH abuse, HTN, HLD, seizures, stroke   Clinical Impression   Patient admitted for the diagnosis above.  PTA he lives at home and has PCA assist for ADL, iADL, community mobility, and medications.  Difficult to ascertain a true status at home, patient states he has help, but "could do it if I wanted."  Currently he is CGA for basic mobility at a RW level, and closer to Mod A for lower body ADL.  OT can follow in the acute setting to address deficits, and no post acute OT is anticipated given PCA support at home.        If plan is discharge home, recommend the following: Assist for transportation;Assistance with cooking/housework;A little help with bathing/dressing/bathroom;A little help with walking and/or transfers    Functional Status Assessment  Patient has had a recent decline in their functional status and demonstrates the ability to make significant improvements in function in a reasonable and predictable amount of time.  Equipment Recommendations  None recommended by OT    Recommendations for Other Services       Precautions / Restrictions Precautions Precautions: Fall Restrictions Weight Bearing Restrictions: No      Mobility Bed Mobility Overal bed mobility: Needs Assistance Bed Mobility: Supine to Sit     Supine to sit: Min assist          Transfers Overall transfer level: Needs assistance   Transfers: Sit to/from Stand, Bed to chair/wheelchair/BSC Sit to Stand: Contact guard assist, Min assist     Step pivot transfers: Contact guard assist            Balance Overall balance assessment: Needs  assistance Sitting-balance support: Single extremity supported, No upper extremity supported, Feet supported Sitting balance-Leahy Scale: Fair     Standing balance support: Single extremity supported, Bilateral upper extremity supported, During functional activity Standing balance-Leahy Scale: Poor                             ADL either performed or assessed with clinical judgement   ADL       Grooming: Wash/dry hands;Wash/dry face;Supervision/safety;Sitting           Upper Body Dressing : Contact guard assist;Sitting;Supervision/safety   Lower Body Dressing: Sit to/from stand;Minimal assistance;Moderate assistance   Toilet Transfer: Contact guard assist;Rolling walker (2 wheels);Regular Toilet;Ambulation                   Vision   Vision Assessment?: No apparent visual deficits     Perception Perception: Within Functional Limits       Praxis Praxis: WFL       Pertinent Vitals/Pain Pain Assessment Pain Assessment: No/denies pain     Extremity/Trunk Assessment Upper Extremity Assessment Upper Extremity Assessment: Generalized weakness   Lower Extremity Assessment Lower Extremity Assessment: Defer to PT evaluation   Cervical / Trunk Assessment Cervical / Trunk Assessment: Kyphotic   Communication Communication Communication: No apparent difficulties   Cognition Arousal: Alert Behavior During Therapy: Flat affect Overall Cognitive Status: No family/caregiver present to determine baseline cognitive functioning Area of Impairment: Following commands, Safety/judgement, Awareness,  Problem solving                       Following Commands: Follows one step commands with increased time Safety/Judgement: Decreased awareness of safety, Decreased awareness of deficits   Problem Solving: Slow processing, Difficulty sequencing, Requires verbal cues, Requires tactile cues       General Comments  vss    Exercises     Shoulder  Instructions      Home Living Family/patient expects to be discharged to:: Private residence Living Arrangements: Other (Comment) Available Help at Discharge: Personal care attendant Type of Home: Mobile home Home Access: Stairs to enter Entrance Stairs-Number of Steps: 2 Entrance Stairs-Rails: Right;Left Home Layout: One level     Bathroom Shower/Tub: Producer, television/film/video: Standard Bathroom Accessibility: Yes How Accessible: Accessible via walker Home Equipment: Grab bars - tub/shower   Additional Comments: pt reported he lived with caregivers, ambulated in house with nothing and could get around without assist,  No one to corroborate this.      Prior Functioning/Environment Prior Level of Function : Needs assist             Mobility Comments: pt states he can walk around by himself.  [place] distances/household ADLs Comments: Pt states he can bathe, dress, toilet for himself.  Caregivers cook for him and clean and drive. Pt states he does not drive.        OT Problem List: Decreased strength;Decreased safety awareness;Impaired balance (sitting and/or standing)      OT Treatment/Interventions: Self-care/ADL training;Therapeutic activities;Balance training;Patient/family education    OT Goals(Current goals can be found in the care plan section) Acute Rehab OT Goals Patient Stated Goal: Return home OT Goal Formulation: With patient Time For Goal Achievement: 07/18/23 Potential to Achieve Goals: Good ADL Goals Pt Will Perform Grooming: with modified independence;standing Pt Will Perform Lower Body Dressing: with modified independence;sit to/from stand Pt Will Transfer to Toilet: with modified independence;ambulating;regular height toilet  OT Frequency: Min 1X/week    Co-evaluation              AM-PAC OT "6 Clicks" Daily Activity     Outcome Measure Help from another person eating meals?: None Help from another person taking care of personal  grooming?: A Little Help from another person toileting, which includes using toliet, bedpan, or urinal?: A Little Help from another person bathing (including washing, rinsing, drying)?: A Lot Help from another person to put on and taking off regular upper body clothing?: A Little Help from another person to put on and taking off regular lower body clothing?: A Lot 6 Click Score: 17   End of Session Equipment Utilized During Treatment: Gait belt;Rolling walker (2 wheels) Nurse Communication: Mobility status  Activity Tolerance: Patient tolerated treatment well Patient left: in bed;with call bell/phone within reach  OT Visit Diagnosis: Unsteadiness on feet (R26.81);Muscle weakness (generalized) (M62.81);Other symptoms and signs involving cognitive function                Time: 1316-1335 OT Time Calculation (min): 19 min Charges:  OT General Charges $OT Visit: 1 Visit OT Evaluation $OT Eval Moderate Complexity: 1 Mod  07/04/2023  RP, OTR/L  Acute Rehabilitation Services  Office:  [phone]   Suzanna Obey 07/04/2023, 3:40 PM

## 2023-07-04 NOTE — Progress Notes (Signed)
Neurology Progress Note  Brief HPI: 82 year old patient with a history of COPD, A-fib not on anticoagulation, alcohol abuse in remission, hypertension, hyperlipidemia, seizures on Keppra was brought in with generalized shaking and was noted to have left-sided weakness after the shaking resolved.  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  Brain MRI demonstrated no acute stroke but diffusion changes right mesial temporal lobe likely representing postseizure changes.  Subjective: Patient is alert and oriented and reports he is doing well, sensation is intact to all 4 extremities and strength of lower extremities is equal.  He still does have some remaining left lower extremity incoordination, but deficits appear to have to be resolving at this time  Exam: Vitals:   07/04/23 1109 07/04/23 1109  BP:    Pulse: 86   Resp: (!) 22   Temp:  98.9 F (37.2 C)  SpO2: 93%    Gen: In bed, NAD Resp: non-labored breathing, no acute distress Abd: soft, nt  Neuro: Mental Status: Oriented to person place time and situation Cranial Nerves: Pupils equal round and reactive, face symmetrical, hearing intact to voice, phonation normal Motor: Able to move all 4 extremities with symmetrical good antigravity strength Sensory: Intact to light touch in all 4 extremities Cerebellar: Heel-knee-shin intact on the right with some incoordination noted on the left Gait: Deferred  Pertinent Labs:    Latest Ref Rng & Units 07/04/2023    5:40 AM 07/03/2023    7:02 AM 11/11/2022    6:19 AM  CBC  WBC 4.0 - 10.5 K/uL 11.9  13.5  11.2   Hemoglobin 13.0 - 17.0 g/dL 54.0  98.1  19.1   Hematocrit 39.0 - 52.0 % 34.6  35.9  38.0   Platelets 150 - 400 K/uL 216  259  209        Latest Ref Rng & Units 07/04/2023    5:40 AM 07/03/2023    9:38 AM 11/11/2022    6:19 AM  BMP  Glucose 70 - 99 mg/dL 478  295  90   BUN 8 - 23 mg/dL 15  16  21     Creatinine 0.61 - 1.24 mg/dL 6.21  3.08  6.57   Sodium 135 - 145 mmol/L 131  134  134   Potassium 3.5 - 5.1 mmol/L 4.4  4.9  4.3   Chloride 98 - 111 mmol/L 99  98  98   CO2 22 - 32 mmol/L 22  26  24    Calcium 8.9 - 10.3 mg/dL 8.5  9.0  8.6     Imaging Reviewed:  CT head: No acute abnormality, moderate atrophy and chronic microvascular ischemic changes  MRI brain: Diffusion signal abnormality involving mesial right temporal lobe/hippocampal formation, likely reflecting changes related to seizure  EEG: Pending  Assessment: 82 year old patient with history of COPD, A-fib not on anticoagulation, call abuse in remission, hypertension, hyperlipidemia and seizures on Keppra came in for evaluation of seizure with left leg weakness noted afterwards.  MRI brain was negative for acute stroke, and patient has had episodes of Todd's paralysis in the past before.  This is likely the cause of his left leg weakness, which has mostly resolved on exam.  Will obtain routine EEG and anticipate that if this is normal patient can be discharged today.  Impression: Todd's paralysis causing left lower extremity weakness and incoordination  Recommendations: -Routine EEG -Continue Keppra 1000 mg twice daily -If EEG normal, neurology  will sign off  Cortney E Ernestina Columbia , MSN, AGACNP-BC [facility] See Amion for schedule and pager information 07/04/2023 12:24 PM   NEUROHOSPITALIST ADDENDUM Performed a face to face diagnostic evaluation.   I have reviewed the contents of history and physical exam as documented by PA/ARNP/Resident and agree with above documentation.  I have discussed and formulated the above plan as documented. Edits to the note have been made as needed.  Impression/Key exam findings/Plan: L leg weakness has mostly resolved. Getting routine EEG. I suspect this will likely be negative. Okay to discharge from a neuro standpoint after routine EEG. We will signoff. Please let us know  if the rEEG shows seizures or other significant abnormalities.  Erick Blinks, MD [facility] [phone]   If 7pm to 7am, please call on call as listed on AMION.

## 2023-07-04 NOTE — Evaluation (Signed)
Clinical/Bedside Swallow Evaluation Patient Details  Name: Blake [name] MRN: [id number] Date of Birth: Jan 24, 1941  Today's Date: 07/04/2023 Time: SLP Start Time (ACUTE ONLY): 1353 SLP Stop Time (ACUTE ONLY): 1420 SLP Time Calculation (min) (ACUTE ONLY): 27 min  Past Medical History:  Past Medical History:  Diagnosis Date   Chronic insomnia    Cigarette smoker    COPD (chronic obstructive pulmonary disease) (HCC)    Diverticulosis of colon    Elevated prostate specific antigen (PSA)    ETOH abuse    GERD (gastroesophageal reflux disease)    Hx of colonic polyps    Hyperlipidemia    Hypertension    Pulmonary nodule    Seizures (HCC)    Shortness of breath    Stroke [facility])    Past Surgical History:  Past Surgical History:  Procedure Laterality Date   COLONOSCOPY     ESOPHAGOGASTRODUODENOSCOPY (EGD) WITH PROPOFOL N/A 07/21/2018   Procedure: ESOPHAGOGASTRODUODENOSCOPY (EGD) WITH PROPOFOL;  Surgeon: Shellia Cleverly, DO;  Location: [facility] ENDOSCOPY;  Service: Gastroenterology;  Laterality: N/A;   needle biopsy RUL nodule  01/2009   benign   TONSILLECTOMY  1947   VASECTOMY     HPI:  Blake [name] is a 82 y.o. male with medical history significant of chronic insomnia, tobacco use disorder less than a pack per day, COPD, atrial fibrillation not on anticoagulation, history of alcohol use reported last drink several months ago, GERD, hyperlipidemia, hypertension, seizure disorder, and more presents the ED with a chief complaint of shaking on 07/03/23. Patient had no recollection of the event.  It is reported that patient's wife was giving him medication around 5:15 in the morning when she found him shaking in his bed.  She reported it looked like a seizure, but he was responsive the whole time.  He stated that he "felt weird."  He was apparently unable to walk following this event.  There was no postictal state, no definite loss of consciousness.  ED provider reported that on his exam patient  had left upper extremity weakness with pronator drift and difficulty with finger-nose-finger.   He does have left hand grip strength weakness compared to right, and he is still having trouble with finger-nose-finger.  Neurohospitalist was consulted by the ER and recommended admission for CVA workup.  It is of note the patient has a history of seizures but has not had any missed doses of Keppra.  It is reported that he has not drink alcohol since February, making withdrawal seizure very low on the differential.  Without loss of consciousness or postictal state, more likely that this was just tremors and not seizure activity.07/03/23 MRI revealed  Diffusion signal abnormality involving the mesial right temporal  lobe/hippocampal formation, likely reflecting changes related to  seizure given provided history. Correlation with EEG recommended.  3. Underlying age-related cerebral atrophy with mild to moderate  chronic microvascular ischemic disease.  ST consulted for swallow evaluation.    Assessment / Plan / Recommendation  Clinical Impression  Pt with a hx of esophageal dysphagia per chart review/MBS completion 03/10/20 with recommendation for Dysphagia 2/thin liquids.  Pt denotes consuming "whatever he wants" at home without difficulty.  No family present to confirm/deny pt claims.  Pt with slightly impaired mastication as he removed peeling from grapes during lunch tray consumption. Delayed cough noted after solids and intermittent wet cough post intake.  Recommend downgrade diet to Dysphagia 2/thin liquids with ST f/u for diet tolerance/dysphagia management d/t esophageal component of swallowing  given pt pmhx and prior MBS concerning for post-prandial risk for aspiration d/t esophageal dysphagia symptoms present during study.  Esophageal f/u may be beneficial if this has not been completed in past.  ST will f/u for diet tolerance during this acute stay and education provided to pt/family.  Thank you for this  consult. SLP Visit Diagnosis: Dysphagia, pharyngoesophageal phase (R13.14)    Aspiration Risk  Mild aspiration risk;Moderate aspiration risk    Diet Recommendation   Thin;Dysphagia 2 (chopped)  Medication Administration: Whole meds with puree    Other  Recommendations Recommended Consults: Consider esophageal assessment (if hasn't been completed in past) Oral Care Recommendations: Oral care BID    Recommendations for follow up therapy are one component of a multi-disciplinary discharge planning process, led by the attending physician.  Recommendations may be updated based on patient status, additional functional criteria and insurance authorization.  Follow up Recommendations Follow physician's recommendations for discharge plan and follow up therapies      Assistance Recommended at Discharge  TBD  Functional Status Assessment Patient has had a recent decline in their functional status and demonstrates the ability to make significant improvements in function in a reasonable and predictable amount of time.  Frequency and Duration min 1 x/week  1 week       Prognosis Prognosis for improved oropharyngeal function: Good      Swallow Study   General Date of Onset: 07/03/23 HPI: Blake [name] is a 82 y.o. male with medical history significant of chronic insomnia, tobacco use disorder less than a pack per day, COPD, atrial fibrillation not on anticoagulation, history of alcohol use reported last drink several months ago, GERD, hyperlipidemia, hypertension, seizure disorder, and more presents the ED with a chief complaint of shaking.  Patient has no recollection of the event.  It is reported that patient's wife was giving him medication around 5:15 in the morning when she found him shaking in his bed.  She reported it looked like a seizure, but he was responsive the whole time.  He stated that he "felt weird."  He was apparently unable to walk following this event.  There was no postictal  state, no definite loss of consciousness.  ED provider reported that on his exam patient had left upper extremity weakness with pronator drift and difficulty with finger-nose-finger.  Patient does not seem to have pronator drift during my exam.  He does have left hand grip strength weakness compared to right, and he is still having trouble with finger-nose-finger.  Neurohospitalist was consulted by the ER and recommended admission for CVA workup.  It is of note the patient has a history of seizures but has not had any missed doses of Keppra.  It is reported that he has not drink alcohol since February, making withdrawal seizure very low on the differential.  Without loss of consciousness or postictal state, more likely that this was just tremors and not seizure activity.07/03/23 MRI revealed  Diffusion signal abnormality involving the mesial right temporal  lobe/hippocampal formation, likely reflecting changes related to  seizure given provided history. Correlation with EEG recommended.  3. Underlying age-related cerebral atrophy with mild to moderate  chronic microvascular ischemic disease.  ST consulted for swallow evaluation. Type of Study: Bedside Swallow Evaluation Previous Swallow Assessment: 03/11/23 MBS with results as follows esophageal dysphagia with recs for Dysphagia 2/thin liquids Diet Prior to this Study: Regular;Thin liquids (Level 0) Temperature Spikes Noted: No Respiratory Status: Room air History of Recent Intubation: No Behavior/Cognition: Alert;Cooperative;Requires  cueing Oral Cavity Assessment: Within Functional Limits Oral Care Completed by SLP: Recent completion by staff Oral Cavity - Dentition: Dentures, top;Dentures, bottom Vision: Functional for self-feeding Self-Feeding Abilities: Able to feed self;Needs assist Patient Positioning: Upright in bed;Other (comment) (leans to L) Baseline Vocal Quality: Low vocal intensity Volitional Cough: Strong Volitional Swallow: Able to  elicit    Oral/Motor/Sensory Function Overall Oral Motor/Sensory Function: Within functional limits   Ice Chips Ice chips: Not tested (pt refused)   Thin Liquid Thin Liquid: Impaired Presentation: Self Fed;Straw Other Comments: belching post swallow    Nectar Thick Nectar Thick Liquid: Not tested   Honey Thick Honey Thick Liquid: Not tested   Puree Puree: Within functional limits Presentation: Self Fed Other Comments: belching post swallow   Solid     Solid: Impaired Presentation: Self Fed Oral Phase Impairments: Impaired mastication Oral Phase Functional Implications: Impaired mastication;Other (comment) (pt removing peeling from grapes during lunch tray consumption) Pharyngeal Phase Impairments: Multiple swallows;Cough - Delayed Other Comments: hx of esophageal dysphagia      Blake [name],M.S.,CCC-SLP 07/04/2023,2:40 PM

## 2023-07-04 NOTE — Progress Notes (Signed)
Physical Therapy Evaluation Patient Details Name: Blake [name] MRN: [id number] DOB: 05-27-1941 Today's Date: 07/04/2023  History of Present Illness  Pt is an 82 y/o male presenting to the ED with chief complaint of shaking.  Wife reported it looked like   a seizure.  MRI showed no acute stroke. EEG results pending.  PMHx:  smker, COPD, ETOH abuse, HTN, HLD, seizures, stroke  Clinical Impression  Pt admitted with/for seizure or other neuro event.  Pt is a little impulsive and apathetic about participation with PT evaluation.  Generally pt needed light min assist for bed mobility, sit to stands and gait with HHA.  Pt currently limited functionally due to the problems listed below.  (see problems list.)  Pt will benefit from PT to maximize function and safety to be able to get home safely with available assist.         If plan is discharge home, recommend the following: A little help with walking and/or transfers;A little help with bathing/dressing/bathroom;Assistance with cooking/housework;Direct supervision/assist for medications management;Direct supervision/assist for financial management;Help with stairs or ramp for entrance   Can travel by private vehicle        Equipment Recommendations Other (comment) (TBD)  Recommendations for Other Services       Functional Status Assessment Patient has had a recent decline in their functional status and demonstrates the ability to make significant improvements in function in a reasonable and predictable amount of time.     Precautions / Restrictions Precautions Precautions: Fall      Mobility  Bed Mobility Overal bed mobility: Needs Assistance Bed Mobility: Supine to Sit     Supine to sit: Min assist     General bed mobility comments: min stability once upright, pt could scoot without assist once up.    Transfers Overall transfer level: Needs assistance   Transfers: Sit to/from Stand Sit to Stand: Contact guard assist, Min  assist (min stability once upright)           General transfer comment: cues for hand placement, no standing assist from higher plinth level, thin stability assist    Ambulation/Gait Ambulation/Gait assistance: Min assist Gait Distance (Feet): 46 Feet Assistive device: 1 person hand held assist Gait Pattern/deviations: Step-through pattern   Gait velocity interpretation: <1.31 ft/sec, indicative of household ambulator   General Gait Details: Swoyer, unsteady steps, variable BOS. Without HHA reachs immedicately to a stationary surface.  Stairs            Wheelchair Mobility     Tilt Bed    Modified Rankin (Stroke Patients Only) Modified Rankin (Stroke Patients Only) Pre-Morbid Rankin Score: Slight disability Modified Rankin: Moderate disability     Balance Overall balance assessment: Needs assistance Sitting-balance support: Single extremity supported, No upper extremity supported, Feet supported Sitting balance-Leahy Scale: Fair     Standing balance support: Single extremity supported, Bilateral upper extremity supported, During functional activity Standing balance-Leahy Scale: Poor Standing balance comment: needing external support                             Pertinent Vitals/Pain Pain Assessment Pain Assessment: Faces Faces Pain Scale: No hurt Pain Intervention(s): Monitored during session    Home Living Family/patient expects to be discharged to:: Private residence Living Arrangements: Other (Comment) Available Help at Discharge: Personal care attendant Type of Home: Mobile home Home Access: Stairs to enter   Entrance Stairs-Number of Steps: 2   Home Layout: One  level Home Equipment: Grab bars - tub/shower Additional Comments: pt reported he lived with caregivers, ambulated in house with nothing and could get around without assist,  No one to corroborate this.    Prior Function Prior Level of Function : Needs assist                ADLs Comments: Pt states he can bathe, dress, toilet for himself.  Caregivers cook for him and clean and drive. Pt states he doesnt drive.."but he could."     Extremity/Trunk Assessment   Upper Extremity Assessment Upper Extremity Assessment: Defer to OT evaluation (L UE appears mildly unccordinated)    Lower Extremity Assessment Lower Extremity Assessment: Generalized weakness;Overall [facility] for tasks assessed    Cervical / Trunk Assessment Cervical / Trunk Assessment: Kyphotic  Communication   Communication Communication: No apparent difficulties  Cognition Arousal: Alert Behavior During Therapy: Restless, Impulsive, Lability Overall Cognitive Status: No family/caregiver present to determine baseline cognitive functioning                                          General Comments General comments (skin integrity, edema, etc.): vss    Exercises     Assessment/Plan    PT Assessment Patient needs continued PT services  PT Problem List Decreased strength;Decreased activity tolerance;Decreased balance;Decreased mobility;Decreased coordination;Decreased safety awareness;Decreased knowledge of use of DME       PT Treatment Interventions Gait training;Functional mobility training;Therapeutic activities;Balance training;Neuromuscular re-education;Patient/family education;DME instruction    PT Goals (Current goals can be found in the Care Plan section)  Acute Rehab PT Goals Patient Stated Goal: pt did not participate\ PT Goal Formulation: Patient unable to participate in goal setting Time For Goal Achievement: 07/18/23 Potential to Achieve Goals: Fair    Frequency Min 1X/week     Co-evaluation               AM-PAC PT "6 Clicks" Mobility  Outcome Measure Help needed turning from your back to your side while in a flat bed without using bedrails?: A Little Help needed moving from lying on your back to sitting on the side of a flat bed without using  bedrails?: A Little Help needed moving to and from a bed to a chair (including a wheelchair)?: A Little Help needed standing up from a chair using your arms (e.g., wheelchair or bedside chair)?: A Little Help needed to walk in hospital room?: A Little Help needed climbing 3-5 steps with a railing? : A Little 6 Click Score: 18    End of Session   Activity Tolerance: Patient tolerated treatment well Patient left: in bed;with call bell/phone within reach;with bed alarm set Nurse Communication: Mobility status PT Visit Diagnosis: Unsteadiness on feet (R26.81);Repeated falls (R29.6);Other symptoms and signs involving the nervous system (R29.898)    Time: 2952-8413 PT Time Calculation (min) (ACUTE ONLY): 19 min   Charges:   PT Evaluation $PT Eval Moderate Complexity: 1 Mod   PT General Charges $$ ACUTE PT VISIT: 1 Visit         07/04/2023  Jacinto Halim., PT Acute Rehabilitation Services [phone]  (office)  Eliseo Gum Zayne Draheim 07/04/2023, 1:13 PM

## 2023-07-04 NOTE — TOC Progression Note (Signed)
Transition of Care [facility]) - Progression Note    Patient Details  Name: Blake [name] MRN: [id number] Date of Birth: [birth year]  [facility] CM/SW Contact  Ronny Bacon, RN Phone Number: 07/04/2023, 4:23 PM  Clinical Narrative:  Spoke with patient at bedside to discuss home health PT/OT options. Patient requested I speak with his caregiver, Mr. Aurther Harlin. Spoke with Mr. Jessica Priest by phone, reports patient did not follow through with home health therapy or outpatient physical therapy services previously. He is agreeable to attempt such services again to see if patient will be compliant. Referral sent to [facility], awaiting response. Also, faxed referral to outpatient physical therapy to [facility]. Caregiver is willing to take patient to outpatient therapy if patient is willing to go.         Expected Discharge Plan and Services                                               Social Determinants of Health (SDOH) Interventions SDOH Screenings   Tobacco Use: High Risk (11/10/2022)    Readmission Risk Interventions     No data to display

## 2023-07-04 NOTE — Progress Notes (Signed)
EEG complete - results pending 

## 2023-07-04 NOTE — Care Management Obs Status (Signed)
MEDICARE OBSERVATION STATUS NOTIFICATION   Patient Details  Name: DEMARIO FANIEL MRN: [id number] Date of Birth: 03-22-1941   Medicare Observation Status Notification Given:  Yes    Oletta Cohn, RN 07/04/2023, 9:47 AM

## 2023-07-04 NOTE — Procedures (Signed)
Patient Name: Blake [name]  MRN: [id number]  Epilepsy Attending: Charlsie Quest  Referring Physician/Provider: Zigmund Daniel., MD  Date: 07/04/2023 Duration: 23.25 mins  Patient history: 82 year old patient with history of COPD, A-fib not on anticoagulation, call abuse in remission, hypertension, hyperlipidemia and seizures on Keppra came in for evaluation of seizure with left leg weakness noted afterwards. EEG to evaluate for seizure  Level of alertness: Awake, asleep  AEDs during EEG study: LEV  Technical aspects: This EEG study was done with scalp electrodes positioned according to the 10-20 International system of electrode placement. Electrical activity was reviewed with band pass filter of 1-70Hz , sensitivity of 7 uV/mm, display speed of 5mm/sec with a 60Hz  notched filter applied as appropriate. EEG data were recorded continuously and digitally stored.  Video monitoring was available and reviewed as appropriate.  Description: The posterior dominant rhythm consists of 8-9 Hz activity of moderate voltage (25-35 uV) seen predominantly in posterior head regions, symmetric and reactive to eye opening and eye closing. Drowsiness was characterized by attenuation of the posterior background rhythm. EEG showed continuous 3 to 5 Hz theta-delta slowing in right fronto-temporal region. Physiologic photic driving was not seen during photic stimulation.  Hyperventilation was not performed.     ABNORMALITY - Continuous slow, right fronto-temporal region.  IMPRESSION: This study is suggestive of cortical dysfunction arising from right fronto-temporal region. likely secondary to underlying structural abnormality, post-ictal state. No seizures or epileptiform discharges were seen throughout the recording.   If suspicion for interictal activity remains a concern, a prolonged study can be considered.    Delene Morais Annabelle Harman

## 2023-07-05 ENCOUNTER — Other Ambulatory Visit (HOSPITAL_COMMUNITY): Payer: Self-pay

## 2023-07-05 DIAGNOSIS — R569 Unspecified convulsions: Secondary | ICD-10-CM | POA: Diagnosis not present

## 2023-07-05 LAB — COMPREHENSIVE METABOLIC PANEL
ALT: 10 U/L (ref 0–44)
AST: 23 U/L (ref 15–41)
Albumin: 3.4 g/dL — ABNORMAL LOW (ref 3.5–5.0)
Alkaline Phosphatase: 79 U/L (ref 38–126)
Anion gap: 7 (ref 5–15)
BUN: 17 mg/dL (ref 8–23)
CO2: 28 mmol/L (ref 22–32)
Calcium: 9.2 mg/dL (ref 8.9–10.3)
Chloride: 97 mmol/L — ABNORMAL LOW (ref 98–111)
Creatinine, Ser: 1.42 mg/dL — ABNORMAL HIGH (ref 0.61–1.24)
GFR, Estimated: 49 mL/min — ABNORMAL LOW (ref >60)
Glucose, Bld: 79 mg/dL (ref 70–99)
Potassium: 4.3 mmol/L (ref 3.5–5.1)
Sodium: 132 mmol/L — ABNORMAL LOW (ref 135–145)
Total Bilirubin: 1 mg/dL (ref 0.3–1.2)
Total Protein: 5.7 g/dL — ABNORMAL LOW (ref 6.5–8.1)

## 2023-07-05 LAB — CBC WITH DIFFERENTIAL/PLATELET
Abs Immature Granulocytes: 0.05 10*3/uL (ref 0.00–0.07)
Basophils Absolute: 0.1 10*3/uL (ref 0.0–0.1)
Basophils Relative: 1 %
Eosinophils Absolute: 0 10*3/uL (ref 0.0–0.5)
Eosinophils Relative: 0 %
HCT: 33.9 % — ABNORMAL LOW (ref 39.0–52.0)
Hemoglobin: 11.7 g/dL — ABNORMAL LOW (ref 13.0–17.0)
Immature Granulocytes: 1 %
Lymphocytes Relative: 19 %
Lymphs Abs: 2 10*3/uL (ref 0.7–4.0)
MCH: 32.3 pg (ref 26.0–34.0)
MCHC: 34.5 g/dL (ref 30.0–36.0)
MCV: 93.6 fL (ref 80.0–100.0)
Monocytes Absolute: 1.1 10*3/uL — ABNORMAL HIGH (ref 0.1–1.0)
Monocytes Relative: 10 %
Neutro Abs: 7.6 10*3/uL (ref 1.7–7.7)
Neutrophils Relative %: 69 %
Platelets: 210 10*3/uL (ref 150–400)
RBC: 3.62 MIL/uL — ABNORMAL LOW (ref 4.22–5.81)
RDW: 13 % (ref 11.5–15.5)
WBC: 10.9 10*3/uL — ABNORMAL HIGH (ref 4.0–10.5)
nRBC: 0 % (ref 0.0–0.2)

## 2023-07-05 LAB — PHOSPHORUS: Phosphorus: 1.7 mg/dL — ABNORMAL LOW (ref 2.5–4.6)

## 2023-07-05 LAB — MAGNESIUM: Magnesium: 2.2 mg/dL (ref 1.7–2.4)

## 2023-07-05 MED ORDER — NICOTINE POLACRILEX 2 MG MT GUM: 2.0000 mg | CHEWING_GUM | OROMUCOSAL | Status: DC | PRN

## 2023-07-05 MED ORDER — NICOTINE 21 MG/24HR TD PT24: 21.0000 mg | MEDICATED_PATCH | Freq: Every day | TRANSDERMAL | Status: DC

## 2023-07-05 MED ORDER — APIXABAN 2.5 MG PO TABS
2.5000 mg | ORAL_TABLET | Freq: Two times a day (BID) | ORAL | 1 refills | Status: AC
Start: 2023-07-05 — End: 2023-09-03
  Filled 2023-07-05: qty 60, 30d supply, fill #0

## 2023-07-05 MED ORDER — LEVETIRACETAM 1000 MG PO TABS
1000.0000 mg | ORAL_TABLET | Freq: Two times a day (BID) | ORAL | 1 refills | Status: AC
  Filled 2023-07-05: qty 60, 30d supply, fill #0

## 2023-07-05 NOTE — TOC Transition Note (Addendum)
Transition of Care [facility]) - CM/SW Discharge Note   Patient Details  Name: Blake [name] MRN: [id number] Date of Birth: 1941/03/11  Transition of Care [facility]) CM/SW Contact:  Kermit Balo, RN Phone Number: 07/05/2023, 11:35 AM   Clinical Narrative:     Patient is discharging home with home health services through [place]. Information on the AVS. Frances Furbish will contact him for the first home visit.  Walker for home ordered through [employer] and will be delivered to the room. PCP arranged with [facility]. Information on the AVS. Pts caregiver, [name] to provide transport home.  1215: [employer] called and pt received a rolling walker in 2021 so he can not receive another through his insurance. CM called and left Jdyn, caregiver a message.   Final next level of care: Home w Home Health Services Barriers to Discharge: No Barriers Identified   Patient Goals and CMS Choice CMS Medicare.gov Compare Post Acute Care list provided to:: Patient Represenative (must comment) Choice offered to / list presented to :  (caregiver)  Discharge Placement                         Discharge Plan and Services Additional resources added to the After Visit Summary for                  DME Arranged: Walker rolling DME Agency: [employer] Date DME Agency Contacted: 07/05/23   Representative spoke with at DME Agency: Zack HH Arranged: PT, OT HH Agency: [facility] Date [facility] Agency Contacted: 07/05/23   Representative spoke with at [facility] Agency: Kandee Keen  Social Determinants of Health (SDOH) Interventions SDOH Screenings   Food Insecurity: No Food Insecurity (07/04/2023)  Housing: Low Risk  (07/04/2023)  Transportation Needs: No Transportation Needs (07/04/2023)  Utilities: Not At Risk (07/04/2023)  Tobacco Use: High Risk (07/04/2023)     Readmission Risk Interventions     No data to display

## 2023-07-05 NOTE — Discharge Summary (Signed)
Physician Discharge Summary  Blake [name] QIH:[id number] DOB: 1940/12/31 DOA: 07/03/2023  PCP: Patient, No Pcp Per  Admit date: 07/03/2023 Discharge date: 07/05/2023  Time spent: 40 minutes  Recommendations for Outpatient Follow-up:  Follow outpatient CBC/CMP  Follow with neurology outpatient for seizures Started on eliquis for atrial fib Follow with cardiology outpatient for bradycardia  Repeat thyroid function tests outside of acute illness  Discharge Diagnoses:  Principal Problem:   CVA (cerebral vascular accident) [facility]) Active Problems:   Atrial fibrillation, chronic (HCC)   Hyperlipidemia   Seizure disorder (HCC)   Seizure-like activity (HCC)   Chronic kidney disease, stage 3b (HCC)   Discharge Condition: stable  Diet recommendation: heart healthy   Filed Weights   07/03/23 0700 07/04/23 1450  Weight: 68.6 kg 65.8 kg    History of present illness:   Blake [name] is Blake [name] 82 y.o. male with medical history significant of chronic insomnia, tobacco use disorder less than Blake [name] pack per day, COPD, atrial fibrillation not on anticoagulation, history of alcohol use reported last drink several months ago, GERD, hyperlipidemia, hypertension, seizure disorder, and more presents the ED with Blake [name] chief complaint of shaking.  Patient has no recollection of the event.  It is reported that patient's wife was giving him medication around 5:15 in the morning when she found him shaking in his bed.  She reported it looked like Blake [name] seizure, but he was responsive the whole time.  He stated that he "felt weird."   ED provider reported that on his exam patient had left upper extremity weakness with pronator drift and difficulty with finger-nose-finger.  Neuro was c/s, MRI showed changes thought to be related to seizure.  Concern for seizure and todd's paralysis.  Plan to continue home keppra.  Restart eliquis.  Bradycardia noted with 2nd degree heart block type 1 and 2:1 block, plan for outpatient follow  up.  See below for additional details  Hospital Course:  Assessment and Plan:  Seizure Todd's Paralysis -CT is without acute changes -MRI motion degraded, diffusion signal abnormality of the mesial R temporal lobe/hippocampal formation, likely reflecting changes related to seizure - mild to moderate chronic microvascular ischemic disease -given no acute stroke, will defer stroke workup   - continue home keppra per neurology - appreciate neurology recommendations, discussed EEG results today - seizure precautions -PT/OT/ST -> HH PT  -Continue to monitor   Per [name] statutes, patients with seizures are not allowed to drive until  they have been seizure-free for six months. Use caution when using heavy equipment or power tools. Avoid working on ladders or at heights. Take showers instead of baths. Ensure the water temperature is not too high on the home water heater. Do not go swimming alone. When caring for infants or small children, sit down when holding, feeding, or changing them to minimize risk of injury to the child in the event you have Blake [name] seizure. Also, Maintain good sleep hygiene. Avoid alcohol.  Atrial fibrillation, chronic (HCC) -currently sinus rhythm -Echo from 2021 with EF 55-60% -will resume eliquis after discussion with he and caregiver   Bradycardia  - appears to have PR lengthening with dropped beats, 2nd degree heart block type 1 with intermittent nonsustained 2:1 - will need cardiology follow up    Acute Metabolic Encephalopathy - related to ativan last night, he's improving  Abnormal Thyroid Function Tests - low TSH / elevated free T4 -> repeat outside of acute illness   Chronic kidney disease, stage 3b (HCC) -  GFR 43 -This is at his baseline -Cr is at baseline -Continue to monitor   Leukocytosis - mild, follow    Hyperlipidemia -Continue Statin        Procedures: EEG IMPRESSION: This study is suggestive of cortical dysfunction  arising from right fronto-temporal region. likely secondary to underlying structural abnormality, post-ictal state. No seizures or epileptiform discharges were seen throughout the recording.    If suspicion for interictal activity remains Blake [name] concern, Blake [name] prolonged study can be considered.    Consultations: Cards neuro  Discharge Exam: Vitals:   07/05/23 0406 07/05/23 0955  BP:  (!) 114/58  Pulse: 62   Resp:  16  Temp:  98 F (36.7 C)  SpO2:  97%   Initially lethargic, appropriately awakened later Discussed d/c plan with patient and caregiver  General: No acute distress. Cardiovascular: brady Lungs: unlabored Neurological: Alert following commands (initially lethargic, more alert later). Moves all extremities 4 with equal strength. Cranial nerves II through XII grossly intact. Extremities: No clubbing or cyanosis. No edema.   Discharge Instructions   Discharge Instructions     Ambulatory referral to Neurology   Complete by: As directed    An appointment is requested in approximately: 4 weeks   Call MD for:  difficulty breathing, headache or visual disturbances   Complete by: As directed    Call MD for:  extreme fatigue   Complete by: As directed    Call MD for:  hives   Complete by: As directed    Call MD for:  persistant dizziness or light-headedness   Complete by: As directed    Call MD for:  persistant nausea and vomiting   Complete by: As directed    Call MD for:  redness, tenderness, or signs of infection (pain, swelling, redness, odor or green/yellow discharge around incision site)   Complete by: As directed    Call MD for:  severe uncontrolled pain   Complete by: As directed    Call MD for:  temperature >100.4   Complete by: As directed    Diet - low sodium heart healthy   Complete by: As directed    Discharge instructions   Complete by: As directed    You were seen for Blake [name] seizure.  You had weakness after the stroke which is phenomenon called todd's  paralysis.    You were seen by neurology.  You had an EEG that showed cortical dysfunction from the right fronto temporal region, likely secondary to underlying structural abnormality, post ictal state.  You'll continue your keppra at discharge.  Follow up with neurology as an outpatient.  Per [facility] statutes, patients with seizures are not allowed to drive until  they have been seizure-free for six months. Use caution when using heavy equipment or power tools. Avoid working on ladders or at heights. Take showers instead of baths. Ensure the water temperature is not too high on the home water heater. Do not go swimming alone. When caring for infants or small children, sit down when holding, feeding, or changing them to minimize risk of injury to the child in the event you have Shariece Viveiros seizure. Also, Maintain good sleep hygiene. Avoid alcohol.  You had an abnormally slow heart rate, you'll follow that up with cardiology outpatient.  You have atrial fibrillation which puts you at risk of strokes.  We'll restart your eliquis.  Return for new, recurrent, or worsening symptoms.  Please ask your PCP to request records from this hospitalization so they know  what was done and what the next steps will be.   Increase activity slowly   Complete by: As directed       Allergies as of 07/05/2023   No Known Allergies      Medication List     TAKE these medications    apixaban 2.5 MG Tabs tablet Commonly known as: ELIQUIS Take 1 tablet (2.5 mg total) by mouth 2 (two) times daily.   furosemide 40 MG tablet Commonly known as: LASIX Take 40 mg by mouth daily.   levETIRAcetam 1000 MG tablet Commonly known as: KEPPRA Take 1 tablet (1,000 mg total) by mouth every 12 (twelve) hours.   lisinopril 5 MG tablet Commonly known as: ZESTRIL Take 5 mg by mouth daily.               Durable Medical Equipment  (From admission, onward)           Start     Ordered   07/05/23 1040  For  home use only DME Walker rolling  Once       Question Answer Comment  Walker: With 5 Inch Wheels   Patient needs Latorie Montesano walker to treat with the following condition Weakness      07/05/23 1040           No Known Allergies  Follow-up Information     [facility] Follow up.   Specialty: Home Health Services Why: The home health agency will contact you for the first home visit. Contact information: [address] [place] [address] [phone]         [facility] Follow up on 08/05/2023.   Why: Your appointment is at 9:20 am but you need to arrive at 9 am. Please bring Dayvon Dax picture ID, insurance card and your medications in their bottes Contact information: [address] [place] [address] [phone]                  The results of significant diagnostics from this hospitalization (including imaging, microbiology, ancillary and laboratory) are listed below for reference.    Significant Diagnostic Studies: EEG adult  Result Date: 07/14/2023 Blake Quest, MD     07/14/2023  7:43 PM Patient Name: Blake [name] MRN: [id number] Epilepsy Attending: Charlsie [name] Referring Physician/Provider: Zigmund Daniel., MD Date: 07/14/2023 Duration: 23.25 mins Patient history: 82 year old patient with history of COPD, Alainna Stawicki-fib not on anticoagulation, call abuse in remission, hypertension, hyperlipidemia and seizures on Keppra came in for evaluation of seizure with left leg weakness noted afterwards. EEG to evaluate for seizure Level of alertness: Awake, asleep AEDs during EEG study: LEV Technical aspects: This EEG study was done with scalp electrodes positioned according to the 10-20 International system of electrode placement. Electrical activity was reviewed with band pass filter of 1-70Hz , sensitivity of 7 uV/mm, display speed of 26mm/sec with Noelle Hoogland 60Hz  notched filter applied as appropriate. EEG data were recorded continuously and digitally  stored.  Video monitoring was available and reviewed as appropriate. Description: The posterior dominant rhythm consists of 8-9 Hz activity of moderate voltage (25-35 uV) seen predominantly in posterior head regions, symmetric and reactive to eye opening and eye closing. Drowsiness was characterized by attenuation of the posterior background rhythm. EEG showed continuous 3 to 5 Hz theta-delta slowing in right fronto-temporal region. Physiologic photic driving was not seen during photic stimulation.  Hyperventilation was not performed.   ABNORMALITY - Continuous slow, right fronto-temporal region.  IMPRESSION: This study is suggestive of cortical dysfunction arising from right fronto-temporal region. likely secondary to underlying structural abnormality, post-ictal state. No seizures or epileptiform discharges were seen throughout the recording.  If suspicion for interictal activity remains Thelda Gagan concern, [facility] prolonged study can be considered. Blake [name]   MR BRAIN WO CONTRAST  Result Date: 07/03/2023 CLINICAL DATA:  Initial evaluation for neuro deficit, stroke suspected. Seizure activity. EXAM: MRI HEAD WITHOUT CONTRAST TECHNIQUE: Multiplanar, multiecho pulse sequences of the brain and surrounding structures were obtained without intravenous contrast. COMPARISON:  CT from earlier the same day. FINDINGS: Brain: Examination markedly degraded by motion artifact. Mild age-related cerebral atrophy. Patchy T2/FLAIR hyperintensity involving the periventricular and deep white matter, consistent with chronic small vessel ischemic disease, mild to moderate in nature. No evidence for acute or subacute ischemia. Diffusion signal abnormality involving the mesial right temporal lobe/hippocampal formation, likely reflecting changes related to seizure given history. Mildly increased associated FLAIR signal intensity, not well seen on this motion degraded exam. No mass lesion, mass effect, or midline shift. Ventricular prominence  related to global parenchymal volume loss without hydrocephalus. No extra-axial fluid collection. Pituitary gland suprasellar region within normal limits. Vascular: Major intracranial vascular flow voids are maintained. Skull and upper cervical spine: Craniocervical junction within normal limits. Bone marrow signal intensity normal. No scalp soft tissue abnormality. Sinuses/Orbits: Prior bilateral ocular lens replacement. Paranasal sinuses are largely clear. No significant mastoid effusion. Other: None. IMPRESSION: 1. Motion degraded exam. 2. Diffusion signal abnormality involving the mesial right temporal lobe/hippocampal formation, likely reflecting changes related to seizure given provided history. Correlation with EEG recommended. 3. Underlying age-related cerebral atrophy with mild to moderate chronic microvascular ischemic disease. Electronically Signed   By: Rise Mu M.D.   On: 07/03/2023 22:23   CT Head Wo Contrast  Result Date: 07/03/2023 CLINICAL DATA:  Altered mental status. EXAM: CT HEAD WITHOUT CONTRAST TECHNIQUE: Contiguous axial images were obtained from the base of the skull through the vertex without intravenous contrast. RADIATION DOSE REDUCTION: This exam was performed according to the departmental dose-optimization program which includes automated exposure control, adjustment of the mA and/or kV according to patient size and/or use of iterative reconstruction technique. COMPARISON:  Head CT dated 11/11/2022. FINDINGS: Brain: Moderate age-related atrophy and chronic microvascular ischemic changes. There is no acute intracranial hemorrhage. No mass effect or midline shift. No extra-axial fluid collection. Vascular: No hyperdense vessel or unexpected calcification. Skull: Normal. Negative for fracture or focal lesion. Sinuses/Orbits: Old nasal and left maxillary fractures. The visualized paranasal sinuses and mastoid air cells are clear. Other: None IMPRESSION: 1. No acute  intracranial pathology. 2. Moderate age-related atrophy and chronic microvascular ischemic changes. Electronically Signed   By: Elgie Collard M.D.   On: 07/03/2023 18:16    Microbiology: No results found for this or any previous visit (from the past 240 hour(s)).   Labs: Basic Metabolic Panel: Recent Labs  Lab 07/03/23 0938 07/04/23 0540 07/05/23 0537  NA 134* 131* 132*  K 4.9 4.4 4.3  CL 98 99 97*  CO2 26 22 28   GLUCOSE 107* 127* 79  BUN 16 15 17   CREATININE 1.58* 1.55* 1.42*  CALCIUM 9.0 8.5* 9.2  MG 1.9 1.9 2.2  PHOS  --   --  1.7*   Liver Function Tests: Recent Labs  Lab 07/03/23 0938 07/04/23 0540 07/05/23 0537  AST 14* 21 23  ALT 10 10 10   ALKPHOS 98 86 79  BILITOT 0.8 0.9 1.0  PROT 6.5 5.8*  5.7*  ALBUMIN 3.8 3.2* 3.4*   No results for input(s): "LIPASE", "AMYLASE" in the last 168 hours. No results for input(s): "AMMONIA" in the last 168 hours. CBC: Recent Labs  Lab 07/03/23 0702 07/04/23 0540 07/05/23 0537  WBC 13.5* 11.9* 10.9*  NEUTROABS 11.2* 10.1* 7.6  HGB 12.5* 12.1* 11.7*  HCT 35.9* 34.6* 33.9*  MCV 93.7 96.1 93.6  PLT 259 216 210   Cardiac Enzymes: No results for input(s): "CKTOTAL", "CKMB", "CKMBINDEX", "TROPONINI" in the last 168 hours. BNP: BNP (last 3 results) No results for input(s): "BNP" in the last 8760 hours.  ProBNP (last 3 results) No results for input(s): "PROBNP" in the last 8760 hours.  CBG: Recent Labs  Lab 07/03/23 0756  GLUCAP 97       Signed:  Lacretia Nicks MD.  [facility] 07/05/2023, 12:35 PM

## 2023-07-05 NOTE — Progress Notes (Signed)
Patient is alert and oriented x person consistently and sometimes place. Thought he was in his room at home and I had to reorient him that he was in the hospital. Displayed agitation and moderate restlessness tonight. Kept attempting to get out of bed to urinate but had a condom cath on. Educated regarding elimination and that he could urinate at any time without getting up. Then he began to get up stating that he wants to smoke a cigarette. When told that he could not smoke due to the facility being a smoke free hospital and that he could possibly fall he shouted "Why not!". He then stated "Well discharge me so that I can go smoke". Informed patient that I could not discharge him to smoke because he was forgetful and confused. Received ativan prn multiple times for agitation and anxiety. Patient does have a strong cough that sounds congested. He denied pain.
